# Patient Record
Sex: Female | Born: 1970 | Race: White | Hispanic: No | Marital: Married
Health system: Midwestern US, Community
[De-identification: ages and names within clinical notes are randomized; demographics above are authoritative.]

## PROBLEM LIST (undated history)

## (undated) DIAGNOSIS — Z923 Personal history of irradiation: Secondary | ICD-10-CM

## (undated) DIAGNOSIS — Z9221 Personal history of antineoplastic chemotherapy: Secondary | ICD-10-CM

## (undated) DIAGNOSIS — C50919 Malignant neoplasm of unspecified site of unspecified female breast: Secondary | ICD-10-CM

## (undated) DIAGNOSIS — E66811 Obesity, class 1: Principal | ICD-10-CM

## (undated) DIAGNOSIS — E7801 Familial hypercholesterolemia: Secondary | ICD-10-CM

## (undated) DIAGNOSIS — Z1231 Encounter for screening mammogram for malignant neoplasm of breast: Principal | ICD-10-CM

## (undated) DIAGNOSIS — Z6831 Body mass index (BMI) 31.0-31.9, adult: Secondary | ICD-10-CM

## (undated) DIAGNOSIS — E039 Hypothyroidism, unspecified: Secondary | ICD-10-CM

## (undated) DIAGNOSIS — Z6832 Body mass index (BMI) 32.0-32.9, adult: Secondary | ICD-10-CM

## (undated) DIAGNOSIS — E6609 Other obesity due to excess calories: Secondary | ICD-10-CM

## (undated) DIAGNOSIS — R1010 Upper abdominal pain, unspecified: Secondary | ICD-10-CM

## (undated) DIAGNOSIS — N3000 Acute cystitis without hematuria: Secondary | ICD-10-CM

## (undated) HISTORY — DX: Personal history of antineoplastic chemotherapy: Z92.21

## (undated) HISTORY — DX: Malignant neoplasm of unspecified site of unspecified female breast: C50.919

## (undated) HISTORY — DX: Personal history of irradiation: Z92.3

## (undated) HISTORY — PX: BREAST BIOPSY: SHX20

## (undated) HISTORY — PX: WISDOM TOOTH EXTRACTION: SHX21

---

## 2010-04-14 ENCOUNTER — Other Ambulatory Visit: Payer: Self-pay | Admitting: Family Medicine

## 2010-04-14 DIAGNOSIS — Z1231 Encounter for screening mammogram for malignant neoplasm of breast: Secondary | ICD-10-CM

## 2010-04-25 ENCOUNTER — Ambulatory Visit
Admission: RE | Admit: 2010-04-25 | Discharge: 2010-04-25 | Disposition: A | Discharge status: Home / Self Care | Payer: BLUE CROSS/BLUE SHIELD | Source: Ambulatory Visit | Attending: Family Medicine | Admitting: Family Medicine

## 2010-04-25 DIAGNOSIS — Z1231 Encounter for screening mammogram for malignant neoplasm of breast: Secondary | ICD-10-CM

## 2011-04-24 ENCOUNTER — Other Ambulatory Visit: Payer: Self-pay | Admitting: Family Medicine

## 2011-04-24 DIAGNOSIS — Z1231 Encounter for screening mammogram for malignant neoplasm of breast: Secondary | ICD-10-CM

## 2011-05-01 ENCOUNTER — Ambulatory Visit
Admission: RE | Admit: 2011-05-01 | Discharge: 2011-05-01 | Disposition: A | Discharge status: Home / Self Care | Payer: BLUE CROSS/BLUE SHIELD | Source: Ambulatory Visit | Attending: Family Medicine | Admitting: Family Medicine

## 2011-05-01 DIAGNOSIS — Z1231 Encounter for screening mammogram for malignant neoplasm of breast: Secondary | ICD-10-CM

## 2011-05-02 ENCOUNTER — Ambulatory Visit: Payer: BLUE CROSS/BLUE SHIELD

## 2011-05-03 ENCOUNTER — Other Ambulatory Visit: Payer: Self-pay | Admitting: Family Medicine

## 2011-05-03 DIAGNOSIS — R928 Other abnormal and inconclusive findings on diagnostic imaging of breast: Secondary | ICD-10-CM

## 2011-05-08 ENCOUNTER — Other Ambulatory Visit: Payer: Self-pay | Admitting: Family Medicine

## 2011-05-08 ENCOUNTER — Ambulatory Visit
Admission: RE | Admit: 2011-05-08 | Discharge: 2011-05-08 | Disposition: A | Discharge status: Home / Self Care | Payer: BC Managed Care – PPO | Source: Ambulatory Visit | Attending: Family Medicine | Admitting: Family Medicine

## 2011-05-08 DIAGNOSIS — N63 Unspecified lump in unspecified breast: Secondary | ICD-10-CM

## 2011-05-08 DIAGNOSIS — R928 Other abnormal and inconclusive findings on diagnostic imaging of breast: Secondary | ICD-10-CM

## 2011-05-18 ENCOUNTER — Ambulatory Visit
Admission: RE | Admit: 2011-05-18 | Discharge: 2011-05-18 | Disposition: A | Discharge status: Home / Self Care | Payer: BC Managed Care – PPO | Source: Ambulatory Visit | Attending: Family Medicine | Admitting: Family Medicine

## 2011-05-18 DIAGNOSIS — N63 Unspecified lump in unspecified breast: Secondary | ICD-10-CM

## 2011-05-22 ENCOUNTER — Other Ambulatory Visit: Payer: Self-pay | Admitting: Family Medicine

## 2011-05-22 DIAGNOSIS — C50911 Malignant neoplasm of unspecified site of right female breast: Secondary | ICD-10-CM

## 2011-05-25 ENCOUNTER — Other Ambulatory Visit: Payer: Self-pay | Admitting: *Deleted

## 2011-05-25 ENCOUNTER — Telehealth: Payer: Self-pay | Admitting: *Deleted

## 2011-05-25 DIAGNOSIS — C50419 Malignant neoplasm of upper-outer quadrant of unspecified female breast: Secondary | ICD-10-CM

## 2011-05-25 NOTE — Telephone Encounter (Signed)
Confirmed [facility] for 05/31/11 at 0800 .  Instructions and contact information given.  

## 2011-05-26 ENCOUNTER — Inpatient Hospital Stay: Admission: RE | Admit: 2011-05-26 | Payer: BC Managed Care – PPO | Source: Ambulatory Visit

## 2011-05-27 ENCOUNTER — Other Ambulatory Visit: Payer: BC Managed Care – PPO

## 2011-05-30 ENCOUNTER — Ambulatory Visit
Admission: RE | Admit: 2011-05-30 | Discharge: 2011-05-30 | Disposition: A | Discharge status: Home / Self Care | Payer: BC Managed Care – PPO | Source: Ambulatory Visit | Attending: Family Medicine | Admitting: Family Medicine

## 2011-05-30 DIAGNOSIS — C50911 Malignant neoplasm of unspecified site of right female breast: Secondary | ICD-10-CM

## 2011-05-30 MED ORDER — GADOBENATE DIMEGLUMINE 529 MG/ML IV SOLN
12.0000 mL | Freq: Once | INTRAVENOUS | Status: AC | PRN
  Administered 2011-05-30: 12 mL via INTRAVENOUS

## 2011-05-31 ENCOUNTER — Telehealth: Payer: Self-pay | Admitting: Oncology

## 2011-05-31 ENCOUNTER — Ambulatory Visit: Payer: BC Managed Care – PPO

## 2011-05-31 ENCOUNTER — Ambulatory Visit
Admission: RE | Admit: 2011-05-31 | Discharge: 2011-05-31 | Disposition: A | Discharge status: Home / Self Care | Payer: BC Managed Care – PPO | Source: Ambulatory Visit | Attending: Radiation Oncology | Admitting: Radiation Oncology

## 2011-05-31 ENCOUNTER — Encounter: Payer: Self-pay | Admitting: Specialist

## 2011-05-31 ENCOUNTER — Ambulatory Visit: Payer: BC Managed Care – PPO | Attending: Surgery | Admitting: Physical Therapy

## 2011-05-31 ENCOUNTER — Other Ambulatory Visit (HOSPITAL_BASED_OUTPATIENT_CLINIC_OR_DEPARTMENT_OTHER): Payer: BC Managed Care – PPO | Admitting: Lab

## 2011-05-31 ENCOUNTER — Other Ambulatory Visit: Payer: Self-pay | Admitting: Family Medicine

## 2011-05-31 ENCOUNTER — Encounter: Payer: Self-pay | Admitting: Oncology

## 2011-05-31 ENCOUNTER — Encounter: Payer: Self-pay | Admitting: *Deleted

## 2011-05-31 ENCOUNTER — Ambulatory Visit (HOSPITAL_BASED_OUTPATIENT_CLINIC_OR_DEPARTMENT_OTHER): Payer: BC Managed Care – PPO | Admitting: Surgery

## 2011-05-31 ENCOUNTER — Ambulatory Visit (HOSPITAL_BASED_OUTPATIENT_CLINIC_OR_DEPARTMENT_OTHER): Payer: BC Managed Care – PPO | Admitting: Oncology

## 2011-05-31 ENCOUNTER — Encounter (INDEPENDENT_AMBULATORY_CARE_PROVIDER_SITE_OTHER): Payer: Self-pay | Admitting: Surgery

## 2011-05-31 VITALS — BP 120/72 | HR 80 | Temp 98.5°F | Resp 20 | Ht 68.6 in | Wt 129.0 lb

## 2011-05-31 VITALS — BP 120/72 | HR 84 | Temp 98.5°F | Ht 68.6 in | Wt 129.2 lb

## 2011-05-31 DIAGNOSIS — C50919 Malignant neoplasm of unspecified site of unspecified female breast: Secondary | ICD-10-CM | POA: Insufficient documentation

## 2011-05-31 DIAGNOSIS — C773 Secondary and unspecified malignant neoplasm of axilla and upper limb lymph nodes: Secondary | ICD-10-CM

## 2011-05-31 DIAGNOSIS — R928 Other abnormal and inconclusive findings on diagnostic imaging of breast: Secondary | ICD-10-CM

## 2011-05-31 DIAGNOSIS — C50419 Malignant neoplasm of upper-outer quadrant of unspecified female breast: Secondary | ICD-10-CM

## 2011-05-31 DIAGNOSIS — M25619 Stiffness of unspecified shoulder, not elsewhere classified: Secondary | ICD-10-CM | POA: Insufficient documentation

## 2011-05-31 DIAGNOSIS — IMO0001 Reserved for inherently not codable concepts without codable children: Secondary | ICD-10-CM | POA: Insufficient documentation

## 2011-05-31 DIAGNOSIS — D0590 Unspecified type of carcinoma in situ of unspecified breast: Secondary | ICD-10-CM

## 2011-05-31 LAB — COMPREHENSIVE METABOLIC PANEL
ALT: 17 U/L (ref 0–35)
AST: 24 U/L (ref 0–37)
CO2: 27 mEq/L (ref 19–32)
Chloride: 103 mEq/L (ref 96–112)
Sodium: 138 mEq/L (ref 135–145)
Total Bilirubin: 0.4 mg/dL (ref 0.3–1.2)
Total Protein: 7.5 g/dL (ref 6.0–8.3)

## 2011-05-31 LAB — CBC WITH DIFFERENTIAL/PLATELET
BASO%: 0.4 % (ref 0.0–2.0)
Eosinophils Absolute: 0 10*3/uL (ref 0.0–0.5)
MCHC: 34.1 g/dL (ref 31.5–36.0)
MONO#: 0.4 10*3/uL (ref 0.1–0.9)
MONO%: 9.6 % (ref 0.0–14.0)
NEUT#: 2.4 10*3/uL (ref 1.5–6.5)
RBC: 4.25 10*6/uL (ref 3.70–5.45)
RDW: 12.3 % (ref 11.2–14.5)
WBC: 4.1 10*3/uL (ref 3.9–10.3)
nRBC: 0 % (ref 0–0)

## 2011-05-31 NOTE — Progress Notes (Signed)
Patient came in this morning as a new patient and she has one Editor, commissioning.I did give her a Medicaid application to fill out and return to me,I also explain to her our co-pay assistance program we offer here at the cancer center.

## 2011-05-31 NOTE — Telephone Encounter (Signed)
gve the pt her pet/ct scan appt along with the may/ June 2013 appt calendar.  Pt is aware she will be contacted with the appt to see dr Jamey Ripa for the port placement.

## 2011-05-31 NOTE — Progress Notes (Signed)
Radiation Oncology         [phone] ________________________________  Initial outpatient Consultation  Name: Carolyn [name] MRN: [id number]  Date: 05/31/2011  DOB: 23-Apr-1970  HQ:IONGE,[email or web address], MD, MD  Streck, Reola Mosher, MD   REFERRING PHYSICIAN: [name], MD  DIAGNOSIS: Right breast cancer  HISTORY OF PRESENT ILLNESS::Carolyn [name] is a 41 y.o. female who is  referred today for newly diagnosed right breast cancer. She underwent a mammogram which showed calcifications measuring about 5 cm on the right breast. At ultrasound of this measured about 2.9 cm and is suspicious right axillary lymph node was noted. 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 No lymphatic tissue was seen and the node is thought to be completely replaced by tumor. MRI of the bilateral breasts was performed which showed a 5 x 3 x 4.5 cm mass in the right breast abutting the chest wall. Approximate 7 cm away from this was a no other mass in the upper inner quadrant of the right breast measuring 1.5 x 1.7 x 1.5 cm. In the left breast a 2 cm area of suspicious enhancement was noted. This has not been biopsied. There were no suspicious lymph nodes. Nonstick panel is pending she did palpate this mass herself. She said it was not painful and had no other symptoms such as nipple discharge or skin changes.  PREVIOUS RADIATION THERAPY: No  PAST MEDICAL HISTORY:  has a past medical history of Breast cancer.    PAST SURGICAL HISTORY:No past surgical history on file.  FAMILY HISTORY: family history includes Breast cancer in her maternal aunt.  SOCIAL HISTORY:  reports that she has never smoked. She does not have any smokeless tobacco history on file. She reports that she does not drink alcohol or use illicit drugs.  ALLERGIES: Allegra  MEDICATIONS:  Current Outpatient Prescriptions  Medication Sig Dispense Refill  .  cholecalciferol (VITAMIN D) 400 UNITS TABS Take 2,000 Units by mouth.      . Loratadine 10 MG CAPS Take 10 mg by mouth daily.      . Multiple Vitamin (MULTIVITAMIN) tablet Take 1 tablet by mouth daily.       No current facility-administered medications for this encounter.   Facility-Administered Medications Ordered in Other Encounters  Medication Dose Route Frequency Provider Last Rate Last Dose  . gadobenate dimeglumine (MULTIHANCE) injection 12 mL  12 mL Intravenous Once PRN Medication Radiologist, MD   12 mL at 05/30/11 2003    REVIEW OF SYSTEMS:  A 15 point review of systems is documented in the electronic medical record. This was obtained by the nursing staff. However, I reviewed this with the patient to discuss relevant findings and make appropriate changes.  Pertinent items are noted in HPI.   PHYSICAL EXAM: VS: BP 120/72  Pulse 80  Temp 98.5 F (36.9 C)  Resp 20  Wt 129 lb (58.514 kg)  She is a pleasant female in no distress sitting comfortably examining table. She has been. There is a hard mass in the lateral aspect of the right breast. There is another mass which appears almost fixed to the chest wall in the upper inner quadrant which appear smaller and is about 2 fingerbreadths in width. No palpable abnormalities of the left breast. I am able to palpate either some biopsy change or positive lymph node in the right axilla. Nothing palpable in the left axilla. She has no palpable supraclavicular cervical adenopathy. She  is 5 out of 5 strength in her bilateral upper and lower extremities.  LABORATORY DATA:  Lab Results  Component Value Date   WBC 4.1 05/31/2011   HGB 13.0 05/31/2011   HCT 38.0 05/31/2011   MCV 89.4 05/31/2011   PLT 166 05/31/2011   Lab Results  Component Value Date   NA 138 05/31/2011   K 3.7 05/31/2011   CL 103 05/31/2011   CO2 27 05/31/2011   Lab Results  Component Value Date   ALT 17 05/31/2011   AST 24 05/31/2011   ALKPHOS 78 05/31/2011   BILITOT 0.4  05/31/2011     RADIOGRAPHY: US Breast Right  05/08/2011  *RADIOLOGY REPORT*  Clinical Data:  The patient for evaluation of possible distortion and calcifications in the outer portion of the right breast noted on screening study dated 05/01/2011.  DIGITAL DIAGNOSTIC RIGHT LIMITED MAMMOGRAM  AND RIGHT BREAST ULTRASOUND:  Comparison:  04/25/2010 from the [facility]. 07/03/2008 and 07/01/2007 from [facility] in [place], [place].  Findings:  Additional views demonstrate suspicious pleomorphic microcalcifications in the 9 o'clock position of the right breast posteriorly; these calcifications cover an area of approximately 2.6 x 1.1 x 1.1 cm.  There is a suggestion of an underlying mass and distortion associated with these calcifications.  On physical exam, I palpate a firm mass at 9 o'clock 8 cm from the right nipple.  Ultrasound is performed, showing an irregular mass with calcifications at 9 o'clock 8 cm from the right nipple measuring 2.9 x 1.2 x 2.4 cm. There is an irregular mass contiguous with the first mass at 9 o'clock 4 cm from the right nipple measuring approximately 1.8 x 2.1 x 1.1 cm. Sonography of the right axilla demonstrates an abnormal lymph node measuring 0.9 x 0.6 x 0.9 cm. The appearance is suspicious for invasive mammary carcinoma with possible axillary nodal metastasis.  Ultrasound-guided core needle biopsy of the masses at 9 o'clock and the right axillary lymph node is suggested.  This has been scheduled for 05/18/2011.  IMPRESSION: Suspicious masses and calcifications at 9 o'clock four and 8 cm from the right nipple.  Suspicious right axillary lymph node. Biopsy is suggested.  Ultrasound-guided core needle biopsy has been scheduled for 05/18/2011.  Dr. Chauncy Passy office was closed when this exam was completed.  BI-RADS CATEGORY 4:  Suspicious abnormality - biopsy should be considered.  Original Report Authenticated By: Daryl Eastern, M.D.   Mr Breast Bilateral W Wo Contrast  05/31/2011  *RADIOLOGY REPORT*  Clinical Data: 41 year old female with newly diagnosed right breast neoplasm.  BILATERAL BREAST MRI WITH AND WITHOUT CONTRAST  Technique: Multiplanar, multisequence MR images of both breasts were obtained prior to and following the intravenous administration of 12ml of Multihance.  Three dimensional images were evaluated at the independent DynaCad workstation.  Comparison:  Recent and prior mammograms and 05/08/2011 ultrasound.  Findings: Moderate normal background parenchymal enhancement is identified.  A 5 x 3 x 4.5 cm confluent enhancing mass (rapid washout kinetics) within the outer right breast, middle and posterior thirds, contains biopsy clip artifact and compatible with biopsy-proven neoplasm. This neoplasm abuts the chest wall musculature.  A 1.5 x 1.7 x 1.5 cm irregular enhancing mass (plateau kinetics) is identified in the upper inner right breast, posterior third, and suspicious for neoplasm.  This mass lies approximately 7 cm superior and medial to the biopsy-proven neoplasm in the outer right breast.  A 2 cm linear area of stippled  enhancement (plateau kinetics) is identified within the deep central left breast - suspicious.  No enlarged or abnormal appearing lymph nodes are noted. The biopsy- proven positive right axillary lymph node is difficult to visualize on this study. No bony or upper hepatic abnormalities are identified.  IMPRESSION: 5 x 3 x 4.5 cm biopsy-proven neoplasm in the posterior outer right breast abutting the chest wall musculature.  1.5 x 1.7 x 1.5 cm irregular enhancing mass in the posterior upper inner right breast - highly suspicious for neoplasm.  Second look ultrasound and biopsy is recommended.  2 cm suspicion linear stippled enhancement within the posterior central left breast - this is unlikely to be visualized sonographically and MR guided biopsy is recommended.  No enlarged or  abnormal-appearing lymph nodes identified. The biopsy-proven positive right axillary lymph node is difficult to visualize on this study.  THREE-DIMENSIONAL MR IMAGE RENDERING ON INDEPENDENT WORKSTATION:  Three-dimensional MR images were rendered by post-processing of the original MR data on an independent workstation.  The three- dimensional MR images were interpreted, and findings were reported in the accompanying complete MRI report for this study.  BI-RADS CATEGORY 5:  Highly suggestive of malignancy - appropriate action should be taken.  Recommend second look right breast ultrasound and biopsy of the upper inner right breast mass.  Recommend MR guided biopsy of the linear stippled enhancement in the posterior central left breast.  Original Report Authenticated By: Rosendo Gros, M.D.   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  Biopsy right axillary lymph node demonstrates ductal carcinoma, consistent with metastatic involvement.  Pathology results of both biopsies are concordant.  The patient was called with the biopsy results and reports. She reports no problems following the biopsy.  She is scheduled for multidisciplinary breast Clinic 05/31/2011, and breast  MRI on 05/26/2011.  **END ADDENDUM** SIGNED BY: Britta Mccreedy, M.D.    05/18/2011  *RADIOLOGY REPORT*  Clinical Data:  Right breast mass, abnormal right axillary node.  ULTRASOUND GUIDED CORE BIOPSY OF THE RIGHT BREAST AND RIGHT AXILLA  Comparison: 05/08/2011  I met with the patient and we discussed the procedure of ultrasound- guided biopsy, including benefits and alternatives.  We discussed the high likelihood of a successful procedure. We discussed the risks of the procedure, including infection, bleeding, tissue injury, clip migration, and inadequate sampling.  Informed written consent was given.   Using sterile technique 2% lidocaine, ultrasound guidance and a 14 gauge automated biopsy device, biopsy was done of the right breast mass (site 1 of 2).  At the conclusion of the procedure a ribbon type tissue marker clip was deployed into the biopsy cavity.  Then, attention is turned to the right axilla.  Using sonogrpahic guidance and a 14 gauge automated biopsy device, biopsy was done of the right axillary mass (site 2 of 2).  No clip is placed.  Follow up 2 view mammogram was performed and dictated separately.  IMPRESSION: Ultrasound guided biopsy of a right breast mass (site 1 of 2) and abnormal right axillary node (site 2 of 2).  No apparent complications. Original Report Authenticated By: Britta Mccreedy, M.D.   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  Biopsy right axillary lymph node demonstrates ductal carcinoma, consistent with metastatic  involvement.  Pathology results of both biopsies are concordant.  The patient was called with the biopsy results and reports. She reports no problems following the biopsy.  She is scheduled for multidisciplinary breast Clinic 05/31/2011, and breast  MRI on 05/26/2011.  **END ADDENDUM** SIGNED BY: Britta Mccreedy, M.D.    05/18/2011  *RADIOLOGY REPORT*  Clinical Data:  Right breast mass, abnormal right axillary node.  ULTRASOUND GUIDED CORE BIOPSY OF THE RIGHT BREAST AND RIGHT AXILLA  Comparison: 05/08/2011  I met with the patient and we discussed the procedure of ultrasound- guided biopsy, including benefits and alternatives.  We discussed the high likelihood of a successful procedure. We discussed the risks of the procedure, including infection, bleeding, tissue injury, clip migration, and inadequate sampling.  Informed written consent was given.  Using sterile technique 2% lidocaine, ultrasound guidance and a 14 gauge  automated biopsy device, biopsy was done of the right breast mass (site 1 of 2).  At the conclusion of the procedure a ribbon type tissue marker clip was deployed into the biopsy cavity.  Then, attention is turned to the right axilla.  Using sonogrpahic guidance and a 14 gauge automated biopsy device, biopsy was done of the right axillary mass (site 2 of 2).  No clip is placed.  Follow up 2 view mammogram was performed and dictated separately.  IMPRESSION: Ultrasound guided biopsy of a right breast mass (site 1 of 2) and abnormal right axillary node (site 2 of 2).  No apparent complications. Original Report Authenticated By: Britta Mccreedy, M.D.   Mm Digital Diag Ltd R  05/08/2011  *RADIOLOGY REPORT*  Clinical Data:  The patient for evaluation of possible distortion and calcifications in the outer portion of the right breast noted on screening study dated 05/01/2011.  DIGITAL DIAGNOSTIC RIGHT LIMITED MAMMOGRAM  AND RIGHT BREAST ULTRASOUND:  Comparison:  04/25/2010 from the [facility]. 07/03/2008 and 07/01/2007 from [facility] in [place], [place].  Findings:  Additional views demonstrate suspicious pleomorphic microcalcifications in the 9 o'clock position of the right breast posteriorly; these calcifications cover an area of approximately 2.6 x 1.1 x 1.1 cm.  There is a suggestion of an underlying mass and distortion associated with these calcifications.  On physical exam, I palpate a firm mass at 9 o'clock 8 cm from the right nipple.  Ultrasound is performed, showing an irregular mass with calcifications at 9 o'clock 8 cm from the right nipple measuring 2.9 x 1.2 x 2.4 cm. There is an irregular mass contiguous with the first mass at 9 o'clock 4 cm from the right nipple measuring approximately 1.8 x 2.1 x 1.1 cm. Sonography of the right axilla demonstrates an abnormal lymph node measuring 0.9 x 0.6 x 0.9 cm. The appearance is suspicious for invasive  mammary carcinoma with possible axillary nodal metastasis.  Ultrasound-guided core needle biopsy of the masses at 9 o'clock and the right axillary lymph node is suggested.  This has been scheduled for 05/18/2011.  IMPRESSION: Suspicious masses and calcifications at 9 o'clock four and 8 cm from the right nipple.  Suspicious right axillary lymph node. Biopsy is suggested.  Ultrasound-guided core needle biopsy has been scheduled for 05/18/2011.  Dr. Chauncy Passy office was closed when this exam was completed.  BI-RADS CATEGORY 4:  Suspicious abnormality - biopsy should be considered.  Original Report Authenticated By: Daryl Eastern, M.D.   Mm Digital Diagnostic Unilat R  05/18/2011  *RADIOLOGY REPORT*  Clinical Data:  Right breast  biopsy.  DIGITAL DIAGNOSTIC RIGHT MAMMOGRAM  Comparison:  Multiple priors  Findings:  Films are performed following ultrasound guided biopsy of a right breast mass.  Images show a ribbon type clip in the 9 o'clock position of the right breast, posteriorly.  Previously biopsied fibrocystic changes noted in the 8 o'clock position of the right breast, posteriorly denoted with a hook type clip.  IMPRESSION: Adequate clip placement following ultrasound guided right breast biopsy.  Original Report Authenticated By: Hiram Gash, M.D.   Mm Digital Screening  05/03/2011  DG SCREEN MAMMOGRAM BILATERAL Bilateral CC and MLO view(s) were taken.  DIGITAL SCREENING MAMMOGRAM WITH CAD:  Comparison:  Prior studies.  The breast tissue is extremely dense.  There are calcifications in the right breast.   Characterization with magnification views is recommended.  The left breast is unremarkable.  Images were processed with CAD.  IMPRESSION:  Calcifications, right breast.  Additional evaluation is indicated.  The patient will be contacted  for additional studies and a supplemental report will follow.  ASSESSMENT: Need additional imaging evaluation and/or prior mammograms for comparison - BI-RADS 0 -  Right  Further imaging of the right breast. 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 Sandford in her mother. We discussed that likely she has invasive disease in the breast and this lymph node corresponds to that. We discussed that the DCIS is likely a sampling error. We discussed that because of the distance between HER-2 masses we would recommend a mastectomy. Because these masses do appear to abut the adjacent pectoralis muscle we have recommended neoadjuvant chemotherapy. At this point she likely has a T2 N1 lesion and will would therefore benefit from postmastectomy radiation. She has discussed neoadjuvant chemotherapy with Dr. Welton Flakes and her mastectomy as well as axillary lymph node dissection with Dr. Jamey Ripa. She will also need to be evaluated by genetics and a Port-A-Cath placed. We're still awaiting her prognostic profile as well as the workup of what is going on in her left breast. We did discuss plastic surgery referral and she does not feel she is ready to undergo evaluation at this time. We briefly discussed the role of radiation intake in decreasing local failures. We discussed the process of simulation the placement of tattoos. I will plan on meeting back with her for followup after her chemotherapy and surgery are complete.   I spent 60 minutes minutes face to face with the patient and more than 50% of that time was spent in counseling and/or coordination of care.   ------------------------------------------------  Lurline Hare, MD

## 2011-05-31 NOTE — Patient Instructions (Signed)
My office will call to schedule you for a Port-A-Cath placement to be done as an outpatient sometime in the near future. We will try to work to scheduling of this around your other tests. Call my office if you have any questions about this. The office phone number is [phone]

## 2011-05-31 NOTE — Progress Notes (Signed)
Carolyn [name] [id number] 05-04-1970 41 y.o. 05/31/2011 10:07 AM  CC Dr. Maryelizabeth Rowan Dr. Cicero Duck Dr. Lurline Hare  REASON FOR CONSULTATION:  41 year old female with new diagnosis of right breast cancer found on screening mammogram. Patient is seen in the multidisciplinary breast clinic for discussion of treatment options.  STAGE:   Cancer of upper-outer quadrant of female breast   Primary site: Breast (Left)   Staging method: AJCC 7th Edition   Clinical: Stage IIB (T2, N1, cM0)   Summary: Stage IIB (T2, N1, cM0)  REFERRING PHYSICIAN: Dr. Cyndia Bent  HISTORY OF PRESENT ILLNESS:  Carolyn [name] is a 41 y.o. female.  Without significant past medical history. She underwent a mammogram that showed calcifications measuring 5 cm on the right breast. She then went on To have an ultrasound which showed the area to measure 2.9 cm. She was also found to have a right axillary lymph node that was suspicious for a malignancy. She had a biopsy of the calcifications that showed high-grade ductal carcinoma in situ. 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 In the lymph node biopsy there was no lymphatic tissue seen and was felt that the node was replaced by tumor. Patient went on to have an MRI of the bilateral breasts performed on evening of 05/30/2011. The MRI showed in the right breast 5 x 3 x 4.5 cm mass abutting the chest wall. About 7 cm away from this there was another mass in the upper inner quadrant that measured 1.5 x 1.7 x 1.5 cm. On the contralateral breast, that is the left breast a 2 cm area suspicious enhancement was noted also this up to date has not been biopsied and arrangements are being made for the biopsy to be performed. In this side there were no suspicious lymph nodes. The prognostic panel is pending. Patient is otherwise without any complaints. She is seen in the multidisciplinary breast clinic today with Dr. Cyndia Bent  and Dr. Lurline Hare.   Past Medical History: Past Medical History  Diagnosis Date  . Breast cancer     Past Surgical History: No past surgical history on file.  Family History: Family History  Problem Relation Age of Onset  . Breast cancer Maternal Aunt     Social History The patient is single she has no children she works as a Nurse, children's. History  Substance Use Topics  . Smoking status: Never Smoker   . Smokeless tobacco: Not on file  . Alcohol Use: No    Allergies: Allergies  Allergen Reactions  . Allegra (Fexofenadine) Hives    Current Medications: Current Outpatient Prescriptions  Medication Sig Dispense Refill  . cholecalciferol (VITAMIN D) 400 UNITS TABS Take 2,000 Units by mouth.      . Loratadine 10 MG CAPS Take 10 mg by mouth daily.      . Multiple Vitamin (MULTIVITAMIN) tablet Take 1 tablet by mouth daily.       No current facility-administered medications for this visit.   Facility-Administered Medications Ordered in Other Visits  Medication Dose Route Frequency Provider Last Rate Last Dose  . gadobenate dimeglumine (MULTIHANCE) injection 12 mL  12 mL Intravenous Once PRN Medication Radiologist, MD   12 mL at 05/30/11 2003    OB/GYN History: Menarche at age 27 she is premenopausal her last menstrual cycle was on 05/12/2011 her periods are very regular. She has not had any pregnancies.  Fertility Discussion: Patient and I discussed fertility discussion and her  desire to have children. At this time she tells me that she is not planning on the having any children. Thus she has no fertility needs identified  Prior History of Cancer: No prior history of cancers.  Health Maintenance:  Colonoscopy Patient has not had a colonoscopy Bone Density No previous bone densities. Last PAP smear Her Pap smears are up-to-date.  ECOG PERFORMANCE STATUS: 0 - Asymptomatic  Genetic Counseling/testing: To complete family history was taken today.  One member of her family on the maternal side was identified as having breast cancer. This was a maternal aunt who had breast cancer at the age of 70. Other history of malignancies such as ovarian colon or endometrial carcinomas. However patient will be referred her to genetic counseling and testing per cc and guidelines given her Early age Of onset or  REVIEW OF SYSTEMS:  A comprehensive review of systems was negative.  PHYSICAL EXAMINATION: Blood pressure 120/72, pulse 84, temperature 98.5 F (36.9 C), height 5' 8.6" (1.742 m), weight 129 lb 3.2 oz (58.605 kg).  WUJ:WJXBJ, healthy, no distress, well nourished and well developed SKIN: skin color, texture, turgor are normal HEAD: No masses, lesions, tenderness or abnormalities EYES: PERRLA, EOMI, sclera clear EARS: External ears normal OROPHARYNX:no exudate, no erythema and lips, buccal mucosa, and tongue normal  NECK: supple, no adenopathy, thyroid normal size, non-tender, without nodularity LYMPH:  Right axillary lymph node is palpable but there are no other palpable lymph nodes in the supraclavicular cervical or left axillary areas no inguinal adenopathy. BREAST:Bilateral breast examination is performed right breast does now reveal a palpable mass with thickening. Left breast no masses or nipple discharge. LUNGS: clear to auscultation and percussion HEART: regular rate & rhythm, no murmurs and no gallops ABDOMEN:abdomen soft, non-tender, normal bowel sounds and no masses or organomegaly BACK: Back symmetric, no curvature., No CVA tenderness EXTREMITIES:less then 2 second capillary refill  NEURO: alert & oriented x 3 with fluent speech, no focal motor/sensory deficits, gait normal, reflexes normal and symmetric   STUDIES/RESULTS: US Breast Right  05/08/2011  *RADIOLOGY REPORT*  Clinical Data:  The patient for evaluation of possible distortion and calcifications in the outer portion of the right breast noted on screening study dated  05/01/2011.  DIGITAL DIAGNOSTIC RIGHT LIMITED MAMMOGRAM  AND RIGHT BREAST ULTRASOUND:  Comparison:  04/25/2010 from the [facility]. 07/03/2008 and 07/01/2007 from [facility] and Breast Imaging Center in [place], [place].  Findings:  Additional views demonstrate suspicious pleomorphic microcalcifications in the 9 o'clock position of the right breast posteriorly; these calcifications cover an area of approximately 2.6 x 1.1 x 1.1 cm.  There is a suggestion of an underlying mass and distortion associated with these calcifications.  On physical exam, I palpate a firm mass at 9 o'clock 8 cm from the right nipple.  Ultrasound is performed, showing an irregular mass with calcifications at 9 o'clock 8 cm from the right nipple measuring 2.9 x 1.2 x 2.4 cm. There is an irregular mass contiguous with the first mass at 9 o'clock 4 cm from the right nipple measuring approximately 1.8 x 2.1 x 1.1 cm. Sonography of the right axilla demonstrates an abnormal lymph node measuring 0.9 x 0.6 x 0.9 cm. The appearance is suspicious for invasive mammary carcinoma with possible axillary nodal metastasis.  Ultrasound-guided core needle biopsy of the masses at 9 o'clock and the right axillary lymph node is suggested.  This has been scheduled for 05/18/2011.  IMPRESSION: Suspicious masses and calcifications at 9  o'clock four and 8 cm from the right nipple.  Suspicious right axillary lymph node. Biopsy is suggested.  Ultrasound-guided core needle biopsy has been scheduled for 05/18/2011.  Dr. Chauncy Passy office was closed when this exam was completed.  BI-RADS CATEGORY 4:  Suspicious abnormality - biopsy should be considered.  Original Report Authenticated By: Daryl Eastern, M.D.   Mr Breast Bilateral W Wo Contrast  05/31/2011  *RADIOLOGY REPORT*  Clinical Data: 41 year old female with newly diagnosed right breast neoplasm.  BILATERAL BREAST MRI WITH AND WITHOUT CONTRAST  Technique: Multiplanar,  multisequence MR images of both breasts were obtained prior to and following the intravenous administration of 12ml of Multihance.  Three dimensional images were evaluated at the independent DynaCad workstation.  Comparison:  Recent and prior mammograms and 05/08/2011 ultrasound.  Findings: Moderate normal background parenchymal enhancement is identified.  A 5 x 3 x 4.5 cm confluent enhancing mass (rapid washout kinetics) within the outer right breast, middle and posterior thirds, contains biopsy clip artifact and compatible with biopsy-proven neoplasm. This neoplasm abuts the chest wall musculature.  A 1.5 x 1.7 x 1.5 cm irregular enhancing mass (plateau kinetics) is identified in the upper inner right breast, posterior third, and suspicious for neoplasm.  This mass lies approximately 7 cm superior and medial to the biopsy-proven neoplasm in the outer right breast.  A 2 cm linear area of stippled enhancement (plateau kinetics) is identified within the deep central left breast - suspicious.  No enlarged or abnormal appearing lymph nodes are noted. The biopsy- proven positive right axillary lymph node is difficult to visualize on this study. No bony or upper hepatic abnormalities are identified.  IMPRESSION: 5 x 3 x 4.5 cm biopsy-proven neoplasm in the posterior outer right breast abutting the chest wall musculature.  1.5 x 1.7 x 1.5 cm irregular enhancing mass in the posterior upper inner right breast - highly suspicious for neoplasm.  Second look ultrasound and biopsy is recommended.  2 cm suspicion linear stippled enhancement within the posterior central left breast - this is unlikely to be visualized sonographically and MR guided biopsy is recommended.  No enlarged or abnormal-appearing lymph nodes identified. The biopsy-proven positive right axillary lymph node is difficult to visualize on this study.  THREE-DIMENSIONAL MR IMAGE RENDERING ON INDEPENDENT WORKSTATION:  Three-dimensional MR images were rendered by  post-processing of the original MR data on an independent workstation.  The three- dimensional MR images were interpreted, and findings were reported in the accompanying complete MRI report for this study.  BI-RADS CATEGORY 5:  Highly suggestive of malignancy - appropriate action should be taken.  Recommend second look right breast ultrasound and biopsy of the upper inner right breast mass.  Recommend MR guided biopsy of the linear stippled enhancement in the posterior central left breast.  Original Report Authenticated By: Rosendo Gros, M.D.   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  Biopsy right axillary lymph node demonstrates ductal carcinoma, consistent with metastatic involvement.  Pathology results of both biopsies are concordant.  The patient was called with the biopsy results and reports. She reports no problems following the biopsy.  She is scheduled for multidisciplinary breast Clinic 05/31/2011, and breast  MRI on 05/26/2011.  **END ADDENDUM** SIGNED BY: Britta Mccreedy, M.D.    05/18/2011  *RADIOLOGY REPORT*  Clinical Data:  Right breast mass, abnormal right axillary node.  ULTRASOUND GUIDED CORE BIOPSY OF THE RIGHT BREAST AND RIGHT AXILLA  Comparison: 05/08/2011  I met with the patient and we discussed the procedure of ultrasound- guided biopsy, including benefits and alternatives.  We discussed the high likelihood of a successful procedure. We discussed the risks of the procedure, including infection, bleeding, tissue injury, clip migration, and inadequate sampling.  Informed written consent was given.  Using sterile technique 2% lidocaine, ultrasound guidance and a 14 gauge automated biopsy device, biopsy was done of the right breast mass (site 1 of 2).  At the conclusion of the procedure a ribbon type tissue marker clip was deployed into the biopsy  cavity.  Then, attention is turned to the right axilla.  Using sonogrpahic guidance and a 14 gauge automated biopsy device, biopsy was done of the right axillary mass (site 2 of 2).  No clip is placed.  Follow up 2 view mammogram was performed and dictated separately.  IMPRESSION: Ultrasound guided biopsy of a right breast mass (site 1 of 2) and abnormal right axillary node (site 2 of 2).  No apparent complications. Original Report Authenticated By: Britta Mccreedy, M.D.   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  Biopsy right axillary lymph node demonstrates ductal carcinoma, consistent with metastatic involvement.  Pathology results of both biopsies are concordant.  The patient was called with the biopsy results and reports. She reports no problems following the biopsy.  She is scheduled for multidisciplinary breast Clinic 05/31/2011, and breast  MRI on 05/26/2011.  **END ADDENDUM** SIGNED BY: Britta Mccreedy, M.D.    05/18/2011  *RADIOLOGY REPORT*  Clinical Data:  Right breast mass, abnormal right axillary node.  ULTRASOUND GUIDED CORE BIOPSY OF THE RIGHT BREAST AND RIGHT AXILLA  Comparison: 05/08/2011  I met with the patient and we discussed the procedure of ultrasound- guided biopsy, including benefits and alternatives.  We discussed the high likelihood of a successful procedure. We discussed the risks of the procedure, including infection, bleeding, tissue injury, clip migration, and inadequate sampling.  Informed written consent was given.  Using sterile technique 2% lidocaine, ultrasound guidance and a 14 gauge automated biopsy device, biopsy was done of the right breast mass (site 1 of 2).  At the conclusion of the procedure a ribbon type tissue marker clip was deployed into the biopsy cavity.  Then, attention is turned to the right axilla.  Using sonogrpahic  guidance and a 14 gauge automated biopsy device, biopsy was done of the right axillary mass (site 2 of 2).  No clip is placed.  Follow up 2 view mammogram was performed and dictated separately.  IMPRESSION: Ultrasound guided biopsy of a right breast mass (site 1 of 2) and abnormal right axillary node (site 2 of 2).  No apparent complications. Original Report Authenticated By: Britta Mccreedy, M.D.   Mm Digital Diag Ltd R  05/08/2011  *RADIOLOGY REPORT*  Clinical Data:  The patient for evaluation of possible distortion and calcifications in the outer portion of the right breast noted on screening study dated 05/01/2011.  DIGITAL DIAGNOSTIC RIGHT LIMITED MAMMOGRAM  AND RIGHT BREAST ULTRASOUND:  Comparison:  04/25/2010 from the [facility]. 07/03/2008 and 07/01/2007 from [facility] in [place], [place].  Findings:  Additional views demonstrate suspicious pleomorphic microcalcifications in the 9 o'clock position of the right breast posteriorly; these calcifications cover an  area of approximately 2.6 x 1.1 x 1.1 cm.  There is a suggestion of an underlying mass and distortion associated with these calcifications.  On physical exam, I palpate a firm mass at 9 o'clock 8 cm from the right nipple.  Ultrasound is performed, showing an irregular mass with calcifications at 9 o'clock 8 cm from the right nipple measuring 2.9 x 1.2 x 2.4 cm. There is an irregular mass contiguous with the first mass at 9 o'clock 4 cm from the right nipple measuring approximately 1.8 x 2.1 x 1.1 cm. Sonography of the right axilla demonstrates an abnormal lymph node measuring 0.9 x 0.6 x 0.9 cm. The appearance is suspicious for invasive mammary carcinoma with possible axillary nodal metastasis.  Ultrasound-guided core needle biopsy of the masses at 9 o'clock and the right axillary lymph node is suggested.  This has been scheduled for 05/18/2011.  IMPRESSION: Suspicious masses and  calcifications at 9 o'clock four and 8 cm from the right nipple.  Suspicious right axillary lymph node. Biopsy is suggested.  Ultrasound-guided core needle biopsy has been scheduled for 05/18/2011.  Dr. Chauncy Passy office was closed when this exam was completed.  BI-RADS CATEGORY 4:  Suspicious abnormality - biopsy should be considered.  Original Report Authenticated By: Daryl Eastern, M.D.   Mm Digital Diagnostic Unilat R  05/18/2011  *RADIOLOGY REPORT*  Clinical Data:  Right breast biopsy.  DIGITAL DIAGNOSTIC RIGHT MAMMOGRAM  Comparison:  Multiple priors  Findings:  Films are performed following ultrasound guided biopsy of a right breast mass.  Images show a ribbon type clip in the 9 o'clock position of the right breast, posteriorly.  Previously biopsied fibrocystic changes noted in the 8 o'clock position of the right breast, posteriorly denoted with a hook type clip.  IMPRESSION: Adequate clip placement following ultrasound guided right breast biopsy.  Original Report Authenticated By: Hiram Gash, M.D.   Mm Digital Screening  05/03/2011  DG SCREEN MAMMOGRAM BILATERAL Bilateral CC and MLO view(s) were taken.  DIGITAL SCREENING MAMMOGRAM WITH CAD:  Comparison:  Prior studies.  The breast tissue is extremely dense.  There are calcifications in the right breast.   Characterization with magnification views is recommended.  The left breast is unremarkable.  Images were processed with CAD.  IMPRESSION:  Calcifications, right breast.  Additional evaluation is indicated.  The patient will be contacted  for additional studies and a supplemental report will follow.  ASSESSMENT: Need additional imaging evaluation and/or prior mammograms for comparison - BI-RADS 0 - Right  Further imaging of the right breast. ,     LABS:    Chemistry      Component Value Date/Time   NA 138 05/31/2011 0815   K 3.7 05/31/2011 0815   CL 103 05/31/2011 0815   CO2 27 05/31/2011 0815   BUN 7 05/31/2011 0815   CREATININE 0.88  05/31/2011 0815      Component Value Date/Time   CALCIUM 9.4 05/31/2011 0815   ALKPHOS 78 05/31/2011 0815   AST 24 05/31/2011 0815   ALT 17 05/31/2011 0815   BILITOT 0.4 05/31/2011 0815      Lab Results  Component Value Date   WBC 4.1 05/31/2011   HGB 13.0 05/31/2011   HCT 38.0 05/31/2011   MCV 89.4 05/31/2011   PLT 166 05/31/2011       PATHOLOGY: ADDITIONAL INFORMATION: 1. 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 THE RATIO OF HER-2: CEP 17 SIGNALS WAS 1.41. Reference  range: Ratio: HER2:CEP17 < 1.8 - gene amplification not observed Ratio: HER2:CEP 17 1.8-2.2 - equivocal result Ratio: HER2:CEP17 > 2.2 - gene amplification observed Pecola Leisure MD Pathologist, Electronic Signature ( Signed 06/01/2011) FINAL DIAGNOSIS Diagnosis 1. Breast, right, needle core biopsy, 9 o'clock - HIGH GRADE DUCTAL CARCINOMA IN SITU - SEE COMMENT. 2. Lymph node, biopsy, Right axilla - POSITIVE FOR DUCTAL CARCINOMA. - SEE COMMENT. Microscopic Comment 1. An immunohistochemical stain panel is performed on the first 9 o'clock breast biopsy. Calponin, p63 and smooth muscle myosin heavy chain all show outside positive staining confirming the presence of a myoepithelial layer. The positive myoepithelial staining supports the lack of invasive carcinoma. The findings are called to [facility] on 05-22-2011. Dr. Mila Palmer has seen the first biopsy in consultation with agreement. 2. Sections from the right axilla biopsy are positive for ductal carcinoma. There is associated lymphoid 1 of 2 FINAL for Yamamoto, Shirely ([id number]) Microscopic Comment(continued) material with the carcinoma. The findings may represent metastatic ductal carcinoma to and axillary lymph node or direct extension of ductal carcinoma from the breast with a prominent lymphoid response. Please correlate with radiologic impression. The findings are called to [facility] on 05/22/11. Dr. Mila Palmer has seen this case in consultation with agreement. (RH:mw 05-22-11) Zandra Abts MD Pathologist, Electronic Signature (Case signed 05/22/2011) Specimen Gross and Clinical Information Specimen Comment 1. Mass and pleomorphic calcs; hx of biopsy in [place] showing fcc years ago. In formalin at 8:40 a.m., CIT < 60 seconds Specimen(s) Obtained: 1. Breast, right, needle core biopsy, 9 o'clock 2. Lymph node, biopsy, Right axilla Gross 1. Received in formalin are two cores and loose  ASSESSMENT    41 year old female with  #1 new diagnosis of right breast carcinoma. Patient had a high-grade ductal carcinoma in situ. MRI revealed a 5 cm area plus another mass. Patient is also noted to have contralateral findings. Biopsy for which has is being arranged. Patient does have a positive note that in fact shows an invasive ductal carcinoma that is high grade. This lymph node is replaced by tumor. Prognostic markers are not available at this time.  #2 patient is 24 with new diagnosis of breast cancer or so she will be referred to genetic counseling and testing per guidelines.     PLAN:    #1 patient was seen in the multidisciplinary breast clinic for discussion of treatment options. We personally reviewed the radiology, pathology, and the case was discussed at the multidisciplinary breast conference. It has been recommended that patient have biopsy of the left breast mass.  #2 patient will have genetic counseling and testing performed as well.  #3 patient will require neoadjuvant chemotherapy and this was discussed with the patient. The type of chemotherapy will be discussed based on patient's prognostic panel. If patient is HER-2/neu positive then we will plan on doing Herceptin based chemotherapy. However if she is HER-2/neu negative then she will receive taxine based chemotherapy. I discussed this in detail with the patient.  #4 patient will need  to have a biopsy of the contralateral breast. And this was discussed with the patient. She knows that she most likely will need a mastectomy of the right breast. However if she is found to have bilateral breast cancers then further discussions with the patient will take place to decide what is the best surgical option. Certainly patient will need radiation therapy as well. All of this was discussed with the patient.  #5 I will  plan on seeing the patient back after she has her Port-A-Cath placed and biopsy of the contralateral breast since we are going to be giving her neoadjuvant chemotherapy. In the meantime genetic counseling and testing will also be performed.     Thank you so much for allowing me to participate in the care of Carolyn [name]. I will continue to follow up the patient with you and assist in her care.  All questions were answered. The patient knows to call the clinic with any problems, questions or concerns. We can certainly see the patient much sooner if necessary.  I spent 60 minutes counseling the patient face to face. The total time spent in the appointment was 60 minutes.   Drue Second, MD Medical/Oncology [facility] [phone] (beeper) [phone] (Office)

## 2011-05-31 NOTE — Patient Instructions (Signed)
Biopsy of left breast area  PET/CT Chemotherapy class See mee in 3 weeks

## 2011-05-31 NOTE — Progress Notes (Signed)
Patient ID: Carolyn [name], female   DOB: 08/02/1970, 41 y.o.   MRN: [id number]  Chief Complaint  Patient presents with  . Breast Cancer    HPI Carolyn [name] is a 41 y.o. female.  She recently found a mass in the right breast lateral aspect. This was evaluated with a mammogram and ultrasound and core biopsy was done. This showed DCIS. However, a right axillary lymph node was also abnormal on ultrasound and a biopsy has shown metastatic carcinoma. Subsequently an MRI was done which has shown a second area in the upper inner quadrant in the right breast and A suspicious area in the left breast which has yet to be further evaluated. He has no other breast symptoms such as pain nipple discharge skin nipple changes symmetric. She has a negative family history. She comes the multidisciplinary conference today for evaluation and management. She has are seen a medical oncologist Has recommended neoadjuvant chemotherapy. The patient will require a Port-A-Cath for access. HPI  Past Medical History  Diagnosis Date  . Breast cancer     No past surgical history on file.  Family History  Problem Relation Age of Onset  . Breast cancer Maternal Aunt     Social History History  Substance Use Topics  . Smoking status: Never Smoker   . Smokeless tobacco: Not on file  . Alcohol Use: No    Allergies  Allergen Reactions  . Allegra (Fexofenadine) Hives    Current Outpatient Prescriptions  Medication Sig Dispense Refill  . cholecalciferol (VITAMIN D) 400 UNITS TABS Take 2,000 Units by mouth.      . Loratadine 10 MG CAPS Take 10 mg by mouth daily.      . Multiple Vitamin (MULTIVITAMIN) tablet Take 1 tablet by mouth daily.       No current facility-administered medications for this visit.   Facility-Administered Medications Ordered in Other Visits  Medication Dose Route Frequency Provider Last Rate Last Dose  . gadobenate dimeglumine (MULTIHANCE) injection 12 mL  12 mL Intravenous Once PRN Medication  Radiologist, MD   12 mL at 05/30/11 2003    Review of Systems Review of Systems  Constitutional: Negative for fever, chills and unexpected weight change.  HENT: Negative for hearing loss, congestion, sore throat, trouble swallowing and voice change.   Eyes: Negative for visual disturbance.  Respiratory: Negative for cough and wheezing.   Cardiovascular: Negative for chest pain, palpitations and leg swelling.  Gastrointestinal: Negative for nausea, vomiting, abdominal pain, diarrhea, constipation, blood in stool, abdominal distention and anal bleeding.  Genitourinary: Negative for hematuria, vaginal bleeding and difficulty urinating.  Musculoskeletal: Negative for arthralgias.  Skin: Negative for rash and wound.  Neurological: Negative for seizures, syncope and headaches.  Hematological: Negative for adenopathy. Does not bruise/bleed easily.  Psychiatric/Behavioral: Negative for confusion.    Blood pressure 120/72, pulse 80, temperature 98.5 F (36.9 C), resp. rate 20, weight 129 lb (58.514 kg).  Physical Exam VS: BP 120/72  Pulse 80  Temp 98.5 F (36.9 C)  Resp 20  Wt 129 lb (58.514 kg)  Physical Exam  Vitals reviewed. Constitutional: She is oriented to person, place, and time. She appears well-developed and well-nourished. No distress.  HENT:  Head: Normocephalic and atraumatic.  Mouth/Throat: Oropharynx is clear and moist.  Eyes: Conjunctivae and EOM are normal. Pupils are equal, round, and reactive to light. No scleral icterus.  Neck: Normal range of motion. Neck supple. No tracheal deviation present. No thyromegaly present.  Cardiovascular: Normal rate, regular rhythm, normal   heart sounds and intact distal pulses.  Exam reveals no gallop and no friction rub.   No murmur heard. Pulmonary/Chest: Effort normal and breath sounds normal. No respiratory distress. She has no wheezes. She has no rales.         Small hard mass in the upper inner quadrant and a larger hard area  laterally as noted on the drawing.  Abdominal: Soft. Bowel sounds are normal. She exhibits no distension and no mass. There is no tenderness. There is no rebound and no guarding.  Musculoskeletal: Normal range of motion. She exhibits no edema and no tenderness.  Neurological: She is alert and oriented to person, place, and time.  Skin: Skin is warm and dry. No rash noted. She is not diaphoretic. No erythema.  Psychiatric: She has a normal mood and affect. Her behavior is normal. Judgment and thought content normal.  Lymphatics: There's a question of a right axillary lymph node present. I did not appreciate any left axillary or supraclavicular adenopathy on either side.  Data Reviewed I have reviewed the patient's mammogram ultrasound and MRI with the radiologist and the pathology slides and reports with the pathologist.  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 She is scheduled for further evaluation of the abnormality on MRI of the left breast. She is also scheduled for genetic counseling and evaluation. Definitive surgical treatment of her breast cancer can be reassessed after her chemotherapy, but I think she will need a mastectomy on the right side regardless of clinical response since the 2 cancers are quite far apart.    CC: Dr [name]   Carolyn [name] 05/31/2011, 11:09 AM    

## 2011-05-31 NOTE — Progress Notes (Signed)
I saw the patient in the multidisciplinary breast clinic today and reviewed with her the distress screening.  She rated her distress at a "3".  I told her about the support services at the [facility], as well as [facility]; she asked that I make a [facility] referral on her behalf.

## 2011-06-01 ENCOUNTER — Other Ambulatory Visit: Payer: Self-pay | Admitting: Family Medicine

## 2011-06-01 DIAGNOSIS — R928 Other abnormal and inconclusive findings on diagnostic imaging of breast: Secondary | ICD-10-CM

## 2011-06-02 ENCOUNTER — Encounter: Payer: Self-pay | Admitting: *Deleted

## 2011-06-02 NOTE — Progress Notes (Signed)
Mailed after appt letter to pt. 

## 2011-06-06 ENCOUNTER — Telehealth: Payer: Self-pay | Admitting: *Deleted

## 2011-06-06 NOTE — Telephone Encounter (Signed)
Spoke to pt concerning BMDC from 5/22.  Pt denies questions or concerns regarding dx or treatment care plan.  Informed pt of positive ER/PR, negative Her2neu.  Confirmed appts for CT/PET, chemo class and left breast bx on 5/30, genetic counseling on 6/3, port placement on 6/11, and f/u with Dr. Welton Flakes on 6/14.  Encourage pt to call with needs.  Received verbal understanding.  Contact information given.

## 2011-06-08 ENCOUNTER — Ambulatory Visit
Admission: RE | Admit: 2011-06-08 | Discharge: 2011-06-08 | Disposition: A | Discharge status: Home / Self Care | Payer: BC Managed Care – PPO | Source: Ambulatory Visit | Attending: Family Medicine | Admitting: Family Medicine

## 2011-06-08 ENCOUNTER — Encounter: Payer: Self-pay | Admitting: *Deleted

## 2011-06-08 ENCOUNTER — Encounter (HOSPITAL_COMMUNITY)
Admission: RE | Admit: 2011-06-08 | Discharge: 2011-06-08 | Disposition: A | Discharge status: Home / Self Care | Payer: BC Managed Care – PPO | Source: Ambulatory Visit | Attending: Oncology | Admitting: Oncology

## 2011-06-08 ENCOUNTER — Other Ambulatory Visit: Payer: BC Managed Care – PPO

## 2011-06-08 ENCOUNTER — Other Ambulatory Visit: Payer: Self-pay | Admitting: Family Medicine

## 2011-06-08 DIAGNOSIS — R928 Other abnormal and inconclusive findings on diagnostic imaging of breast: Secondary | ICD-10-CM

## 2011-06-08 DIAGNOSIS — C773 Secondary and unspecified malignant neoplasm of axilla and upper limb lymph nodes: Secondary | ICD-10-CM | POA: Insufficient documentation

## 2011-06-08 DIAGNOSIS — D259 Leiomyoma of uterus, unspecified: Secondary | ICD-10-CM | POA: Insufficient documentation

## 2011-06-08 DIAGNOSIS — C50419 Malignant neoplasm of upper-outer quadrant of unspecified female breast: Secondary | ICD-10-CM

## 2011-06-08 DIAGNOSIS — K7689 Other specified diseases of liver: Secondary | ICD-10-CM | POA: Insufficient documentation

## 2011-06-08 MED ORDER — FLUDEOXYGLUCOSE F - 18 (FDG) INJECTION
16.1000 | Freq: Once | INTRAVENOUS | Status: AC | PRN
  Administered 2011-06-08: 16.1 via INTRAVENOUS

## 2011-06-08 MED ORDER — IOHEXOL 300 MG/ML  SOLN
100.0000 mL | Freq: Once | INTRAMUSCULAR | Status: AC | PRN
  Administered 2011-06-08: 100 mL via INTRAVENOUS

## 2011-06-09 ENCOUNTER — Other Ambulatory Visit: Payer: BC Managed Care – PPO

## 2011-06-12 ENCOUNTER — Ambulatory Visit (HOSPITAL_BASED_OUTPATIENT_CLINIC_OR_DEPARTMENT_OTHER): Payer: BC Managed Care – PPO | Admitting: Genetic Counselor

## 2011-06-12 ENCOUNTER — Ambulatory Visit
Admission: RE | Admit: 2011-06-12 | Discharge: 2011-06-12 | Disposition: A | Discharge status: Home / Self Care | Payer: BC Managed Care – PPO | Source: Ambulatory Visit | Attending: Family Medicine | Admitting: Family Medicine

## 2011-06-12 ENCOUNTER — Other Ambulatory Visit: Payer: BC Managed Care – PPO

## 2011-06-12 DIAGNOSIS — R928 Other abnormal and inconclusive findings on diagnostic imaging of breast: Secondary | ICD-10-CM

## 2011-06-12 DIAGNOSIS — Z803 Family history of malignant neoplasm of breast: Secondary | ICD-10-CM

## 2011-06-12 DIAGNOSIS — C50419 Malignant neoplasm of upper-outer quadrant of unspecified female breast: Secondary | ICD-10-CM

## 2011-06-12 MED ORDER — GADOBENATE DIMEGLUMINE 529 MG/ML IV SOLN
12.0000 mL | Freq: Once | INTRAVENOUS | Status: AC | PRN
  Administered 2011-06-12: 12 mL via INTRAVENOUS

## 2011-06-12 NOTE — Progress Notes (Addendum)
Dr.  Welton Flakes requested a consultation for genetic counseling and risk assessment for Carolyn [name], a 41 y.o. female, for discussion of her personal history of breast cacner. She presents to clinic today with her sister Carolyn [name], to discuss the possibility of a genetic predisposition to cancer, and to further clarify her risks, as well as her family members' risks for cancer.   HISTORY OF PRESENT ILLNESS: In May 2013, at the age of 57, Carolyn [name] was diagnosed with invasive ductal carcinoma of the breast.    Past Medical History  Diagnosis Date  . Breast cancer     No past surgical history on file.  History  Substance Use Topics  . Smoking status: Never Smoker   . Smokeless tobacco: Not on file  . Alcohol Use: No    REPRODUCTIVE HISTORY AND PERSONAL RISK ASSESSMENT FACTORS: Menarche was at age 21.   Premenopausal Uterus Intact: Yes Ovaries Intact: Yes [id number] , first live birth at age N/A  She has not previously undergone treatment for infertility.   Never used OCPs   She has not used HRT in the past.    FAMILY HISTORY:  We obtained a detailed, 4-generation family history.  Significant diagnoses are listed below: Family History  Problem Relation Age of Onset  . Breast cancer Maternal Aunt   the patient was diagnosed with breast cancer at age 33.  Her mother's fraternal twin sister has a daughter who died of ovarian cancer at age 28.  She has a maternal aunt who had breast cancer at age 69 and another aunt with possible pancreatic cancer in her 7s.  Her maternal grandfather was diagnosed with pancreatic cancer at an unknown age.    The patient's father is alive at 57.  He has 4 siblings, and does not have any reported cancer family history.  Patient's maternal ancestors are of [place] American and possible American [place] descent, and paternal ancestors are of African American descent. There is no reported Ashkenazi Jewish ancestry. There is no known consanguinity.  GENETIC  COUNSELING RISK ASSESSMENT, DISCUSSION, AND SUGGESTED FOLLOW UP: We reviewed the natural history and genetic etiology of sporadic, familial and hereditary cancer syndromes.  About 5-10% of breast cancer is hereditary.  Of this, about 85% is the result of a BRCA1 or BRCA2 mutation.  We reviewed the red flags of hereditary cancer syndromes and the dominant inheritance patterns.  If the BRCA testing is negative, we discussed that we could be testing for the wrong gene.  We discussed gene panels, and that several cancer genes that are associated with different cancers can be tested at the same time.  Because of the different types of cancer that are in the patient's family, we will consider one of the panel tests if she is negative for BRCA mutations.  The patient's personal and family history is suggestive of the following possible diagnosis: hereditary cancer syndrome  We discussed that identification of a hereditary cancer syndrome may help her care providers tailor the patients medical management. If a mutation indicating a hereditary cancer syndrome is detected in this case, the [employer] recommendations would include increased cancer surveillance and possible prophylactic sugery. If a mutation is detected, the patient will be referred back to the referring provider and to any additional appropriate care providers to discuss the relevant options.   If a mutation is not found in the patient, this will decrease the likelihood of hereditary cancer syndrome as the explanation for her breast  cancer. Cancer surveillance options would be discussed for the patient according to the appropriate standard National Comprehensive Cancer Network and American Cancer Society guidelines, with consideration of their personal and family history risk factors. In this case, the patient will be referred back to their care providers for discussions of management.   In order to estimate her chance of  having a BRCA1 or BRCA2 mutation, we used statistical models (Penn II) and laboratory data that take into account her personal medical history, family history and ancestry.  Because each model is different, there can be a lot of variability in the risks they give.  Therefore, these numbers must be considered a rough range and not a precise risk of having a BRCA1 or BRCA2 mutation.  These models estimate that she has approximately a 26% chance of having a mutation. Based on this assessment of her family and personal history, genetic testing is recommended.  After considering the risks, benefits, and limitations, the patient provided informed consent for  the following  testing: [facility] through [facility].   Per the patient's request, we will contact her by telephone to discuss these results. A follow up genetic counseling visit will be scheduled if indicated.  The patient was seen for a total of 60 minutes, greater than 50% of which was spent face-to-face counseling.  This plan is being carried out per Dr. Milta Deiters recommendations.  This note will also be sent to the referring provider via the electronic medical record. The patient will be supplied with a summary of this genetic counseling discussion as well as educational information on the discussed hereditary cancer syndromes following the conclusion of their visit.   Patient was discussed with Dr. Marikay Alar Magrinat in Dr. Feliz Beam absence.  EDUCATIONAL INFORMATION SUPPLIED TO PATIENT AT ENCOUNTER:  Hereditary breast and ovarian cancer syndrome booklet    _______________________________________________________________________ For Office Staff:  Number of people involved in session: 3 Was an Intern/ student involved with case: not applicable

## 2011-06-14 ENCOUNTER — Encounter (HOSPITAL_COMMUNITY): Payer: Self-pay | Admitting: Respiratory Therapy

## 2011-06-19 ENCOUNTER — Encounter: Payer: Self-pay | Admitting: Genetic Counselor

## 2011-06-19 ENCOUNTER — Encounter (HOSPITAL_COMMUNITY): Payer: Self-pay | Admitting: *Deleted

## 2011-06-19 MED ORDER — CHLORHEXIDINE GLUCONATE 4 % EX LIQD: 1.0000 "application " | Freq: Once | CUTANEOUS | Status: DC

## 2011-06-19 MED ORDER — CEFAZOLIN SODIUM 1-5 GM-% IV SOLN
1.0000 g | INTRAVENOUS | Status: AC
  Administered 2011-06-20: 1 g via INTRAVENOUS
  Filled 2011-06-19: qty 50

## 2011-06-20 ENCOUNTER — Ambulatory Visit (HOSPITAL_COMMUNITY): Payer: BC Managed Care – PPO

## 2011-06-20 ENCOUNTER — Telehealth (INDEPENDENT_AMBULATORY_CARE_PROVIDER_SITE_OTHER): Payer: Self-pay | Admitting: General Surgery

## 2011-06-20 ENCOUNTER — Encounter (HOSPITAL_COMMUNITY): Payer: Self-pay | Admitting: Anesthesiology

## 2011-06-20 ENCOUNTER — Encounter (HOSPITAL_COMMUNITY)
Admission: RE | Disposition: A | Discharge status: Home Health | Payer: Self-pay | Source: Ambulatory Visit | Attending: Surgery

## 2011-06-20 ENCOUNTER — Encounter (HOSPITAL_COMMUNITY): Payer: Self-pay | Admitting: *Deleted

## 2011-06-20 ENCOUNTER — Ambulatory Visit (HOSPITAL_COMMUNITY)
Admission: RE | Admit: 2011-06-20 | Discharge: 2011-06-20 | Disposition: A | Discharge status: Home Health | Payer: BC Managed Care – PPO | Source: Ambulatory Visit | Attending: Surgery | Admitting: Surgery

## 2011-06-20 ENCOUNTER — Ambulatory Visit (HOSPITAL_COMMUNITY): Payer: BC Managed Care – PPO | Admitting: Anesthesiology

## 2011-06-20 DIAGNOSIS — C50919 Malignant neoplasm of unspecified site of unspecified female breast: Secondary | ICD-10-CM

## 2011-06-20 DIAGNOSIS — C50419 Malignant neoplasm of upper-outer quadrant of unspecified female breast: Secondary | ICD-10-CM

## 2011-06-20 DIAGNOSIS — C773 Secondary and unspecified malignant neoplasm of axilla and upper limb lymph nodes: Secondary | ICD-10-CM | POA: Insufficient documentation

## 2011-06-20 DIAGNOSIS — C50219 Malignant neoplasm of upper-inner quadrant of unspecified female breast: Secondary | ICD-10-CM | POA: Insufficient documentation

## 2011-06-20 HISTORY — PX: PORTACATH PLACEMENT: SHX2246

## 2011-06-20 LAB — CBC
HCT: 36 % (ref 36.0–46.0)
Hemoglobin: 12.3 g/dL (ref 12.0–15.0)
MCV: 87.6 fL (ref 78.0–100.0)
RBC: 4.11 MIL/uL (ref 3.87–5.11)
RDW: 12.1 % (ref 11.5–15.5)
WBC: 4.1 10*3/uL (ref 4.0–10.5)

## 2011-06-20 SURGERY — INSERTION, TUNNELED CENTRAL VENOUS DEVICE, WITH PORT
Anesthesia: General

## 2011-06-20 MED ORDER — PROPOFOL 10 MG/ML IV EMUL
INTRAVENOUS | Status: DC | PRN
  Administered 2011-06-20: 180 mL via INTRAVENOUS

## 2011-06-20 MED ORDER — PHENYLEPHRINE HCL 10 MG/ML IJ SOLN
INTRAMUSCULAR | Status: DC | PRN
  Administered 2011-06-20 (×4): 80 ug via INTRAVENOUS

## 2011-06-20 MED ORDER — LIDOCAINE HCL (CARDIAC) 20 MG/ML IV SOLN
INTRAVENOUS | Status: DC | PRN
  Administered 2011-06-20: 70 mg via INTRAVENOUS

## 2011-06-20 MED ORDER — ACETAMINOPHEN 10 MG/ML IV SOLN
INTRAVENOUS | Status: AC
  Filled 2011-06-20: qty 100

## 2011-06-20 MED ORDER — SODIUM CHLORIDE 0.9 % IR SOLN
Status: DC | PRN
  Administered 2011-06-20: 10:00:00

## 2011-06-20 MED ORDER — DEXTROSE 5 % IV SOLN
INTRAVENOUS | Status: DC | PRN
  Administered 2011-06-20 (×2): via INTRAVENOUS

## 2011-06-20 MED ORDER — LACTATED RINGERS IV SOLN
INTRAVENOUS | Status: DC | PRN
  Administered 2011-06-20: 09:00:00 via INTRAVENOUS

## 2011-06-20 MED ORDER — MIDAZOLAM HCL 5 MG/5ML IJ SOLN
INTRAMUSCULAR | Status: DC | PRN
  Administered 2011-06-20: 1 mg via INTRAVENOUS

## 2011-06-20 MED ORDER — ONDANSETRON HCL 4 MG/2ML IJ SOLN: 4.0000 mg | Freq: Once | INTRAMUSCULAR | Status: DC | PRN

## 2011-06-20 MED ORDER — HYDROCODONE-ACETAMINOPHEN 5-325 MG PO TABS
ORAL_TABLET | ORAL | Status: AC
  Filled 2011-06-20: qty 1

## 2011-06-20 MED ORDER — IOHEXOL 300 MG/ML  SOLN
INTRAMUSCULAR | Status: DC | PRN
  Administered 2011-06-20: 50 mL via INTRAVENOUS

## 2011-06-20 MED ORDER — HYDROMORPHONE HCL PF 1 MG/ML IJ SOLN: 0.2500 mg | INTRAMUSCULAR | Status: DC | PRN

## 2011-06-20 MED ORDER — HYDROCODONE-ACETAMINOPHEN 5-325 MG PO TABS
1.0000 | ORAL_TABLET | Freq: Once | ORAL | Status: AC
  Administered 2011-06-20: 1 via ORAL

## 2011-06-20 MED ORDER — DEXAMETHASONE SODIUM PHOSPHATE 10 MG/ML IJ SOLN
INTRAMUSCULAR | Status: DC | PRN
  Administered 2011-06-20: 4 mg via INTRAVENOUS

## 2011-06-20 MED ORDER — MUPIROCIN 2 % EX OINT
TOPICAL_OINTMENT | CUTANEOUS | Status: AC
  Filled 2011-06-20: qty 22

## 2011-06-20 MED ORDER — FENTANYL CITRATE 0.05 MG/ML IJ SOLN
INTRAMUSCULAR | Status: DC | PRN
  Administered 2011-06-20: 75 ug via INTRAVENOUS

## 2011-06-20 MED ORDER — 0.9 % SODIUM CHLORIDE (POUR BTL) OPTIME
TOPICAL | Status: DC | PRN
  Administered 2011-06-20: 1000 mL

## 2011-06-20 MED ORDER — ACETAMINOPHEN 10 MG/ML IV SOLN
INTRAVENOUS | Status: DC | PRN
  Administered 2011-06-20: 1000 mg via INTRAVENOUS

## 2011-06-20 MED ORDER — HYDROCODONE-ACETAMINOPHEN 5-325 MG PO TABS: 1.0000 | ORAL_TABLET | ORAL | Status: AC | PRN

## 2011-06-20 MED ORDER — HEPARIN SOD (PORK) LOCK FLUSH 100 UNIT/ML IV SOLN
INTRAVENOUS | Status: DC | PRN
  Administered 2011-06-20: 500 [IU] via INTRAVENOUS

## 2011-06-20 MED ORDER — ONDANSETRON HCL 4 MG/2ML IJ SOLN
INTRAMUSCULAR | Status: DC | PRN
  Administered 2011-06-20: 4 mg via INTRAVENOUS

## 2011-06-20 SURGICAL SUPPLY — 56 items
BAG DECANTER FOR FLEXI CONT (MISCELLANEOUS) ×2 IMPLANT
BLADE SURG 15 STRL LF DISP TIS (BLADE) ×1 IMPLANT
BLADE SURG 15 STRL SS (BLADE) ×1
CANISTER SUCTION 2500CC (MISCELLANEOUS) IMPLANT
CHLORAPREP W/TINT 10.5 ML (MISCELLANEOUS) ×2 IMPLANT
CLOTH BEACON ORANGE TIMEOUT ST (SAFETY) ×2 IMPLANT
COVER PROBE W GEL 5X96 (DRAPES) ×2 IMPLANT
COVER SURGICAL LIGHT HANDLE (MISCELLANEOUS) ×2 IMPLANT
CRADLE DONUT ADULT HEAD (MISCELLANEOUS) ×2 IMPLANT
DECANTER SPIKE VIAL GLASS SM (MISCELLANEOUS) ×2 IMPLANT
DERMABOND ADVANCED (GAUZE/BANDAGES/DRESSINGS) ×1
DERMABOND ADVANCED .7 DNX12 (GAUZE/BANDAGES/DRESSINGS) ×1 IMPLANT
DRAPE C-ARM 42X72 X-RAY (DRAPES) ×2 IMPLANT
DRAPE LAPAROTOMY TRNSV 102X78 (DRAPE) ×2 IMPLANT
DRAPE UTILITY 15X26 W/TAPE STR (DRAPE) ×4 IMPLANT
ELECT CAUTERY BLADE 6.4 (BLADE) ×2 IMPLANT
ELECT REM PT RETURN 9FT ADLT (ELECTROSURGICAL) ×2
ELECTRODE REM PT RTRN 9FT ADLT (ELECTROSURGICAL) ×1 IMPLANT
GAUZE SPONGE 4X4 16PLY XRAY LF (GAUZE/BANDAGES/DRESSINGS) ×2 IMPLANT
GLOVE BIOGEL PI IND STRL 6.5 (GLOVE) ×1 IMPLANT
GLOVE BIOGEL PI IND STRL 7.0 (GLOVE) ×1 IMPLANT
GLOVE BIOGEL PI INDICATOR 6.5 (GLOVE) ×1
GLOVE BIOGEL PI INDICATOR 7.0 (GLOVE) ×1
GLOVE ECLIPSE 6.5 STRL STRAW (GLOVE) ×2 IMPLANT
GLOVE EUDERMIC 7 POWDERFREE (GLOVE) ×2 IMPLANT
GOWN PREVENTION PLUS XLARGE (GOWN DISPOSABLE) ×2 IMPLANT
GOWN STRL NON-REIN LRG LVL3 (GOWN DISPOSABLE) ×6 IMPLANT
INTRODUCER 13FR (MISCELLANEOUS) IMPLANT
INTRODUCER COOK 11FR (CATHETERS) IMPLANT
KIT BASIN OR (CUSTOM PROCEDURE TRAY) ×2 IMPLANT
KIT PORT POWER 9.6FR MRI PREA (Catheter) IMPLANT
KIT PORT POWER ISP 8FR (Catheter) IMPLANT
KIT POWER CATH 8FR (Catheter) IMPLANT
KIT POWER PORT SLIM 6FR (PORTABLE EQUIPMENT SUPPLIES) ×2 IMPLANT
KIT ROOM TURNOVER OR (KITS) ×2 IMPLANT
MARKER SKIN DUAL TIP RULER LAB (MISCELLANEOUS) ×2 IMPLANT
NEEDLE HYPO 25GX1X1/2 BEV (NEEDLE) ×2 IMPLANT
NS IRRIG 1000ML POUR BTL (IV SOLUTION) ×2 IMPLANT
PACK SURGICAL SETUP 50X90 (CUSTOM PROCEDURE TRAY) ×2 IMPLANT
PAD ARMBOARD 7.5X6 YLW CONV (MISCELLANEOUS) ×2 IMPLANT
PENCIL BUTTON HOLSTER BLD 10FT (ELECTRODE) ×2 IMPLANT
Power port slim ×2 IMPLANT
SET INTRODUCER 12FR PACEMAKER (SHEATH) IMPLANT
SET SHEATH INTRODUCER 10FR (MISCELLANEOUS) IMPLANT
SHEATH COOK PEEL AWAY SET 9F (SHEATH) IMPLANT
SUT MNCRL AB 4-0 PS2 18 (SUTURE) ×2 IMPLANT
SUT PROLENE 2 0 SH DA (SUTURE) ×2 IMPLANT
SUT VIC AB 3-0 SH 18 (SUTURE) ×2 IMPLANT
SYR 20ML ECCENTRIC (SYRINGE) ×6 IMPLANT
SYR 5ML LUER SLIP (SYRINGE) ×2 IMPLANT
SYR CONTROL 10ML LL (SYRINGE) ×2 IMPLANT
TOWEL OR 17X24 6PK STRL BLUE (TOWEL DISPOSABLE) ×2 IMPLANT
TOWEL OR 17X26 10 PK STRL BLUE (TOWEL DISPOSABLE) ×2 IMPLANT
TUBE CONNECTING 12X1/4 (SUCTIONS) ×2 IMPLANT
WATER STERILE IRR 1000ML POUR (IV SOLUTION) IMPLANT
YANKAUER SUCT BULB TIP NO VENT (SUCTIONS) ×2 IMPLANT

## 2011-06-20 NOTE — Telephone Encounter (Signed)
Called into [facility] [phone] on [address]  [facility] and spoke to Joe and called in Norco 5/325 #15 1q 4-6 hrs prn for pain.Marland Kitchenand pt is aware

## 2011-06-20 NOTE — Op Note (Signed)
[facility] C Brandenburger 1970/07/26 [id number] 06/01/2011  Preoperative diagnosis: PAC needed  Postoperative diagnosis: Same  Procedure: Portacath Placement  Surgeon: [name], MD, FACS  Anesthesia: General  Clinical History and Indications: The patient is getting ready to begin chemotherapy for her cancer. She  needs a Port-A-Cath for venous access.  Description of Procedure: I have seen the patient in the holding area and confirmed the plans for the procedure as noted above. I reviewed the risks and complications again and the patient has no further questions.  The patient was then taken to the operating room. After satisfactory general anesthesia had been obtained the upper chest and lower neck were prepped and draped as a sterile field. The timeout was done.  The left subclavian vein was entered and the guidewire threaded into the superior vena cava right atrial area under fluoroscopic guidance. It initially went into the L. IJ.Marland Kitchen An incision was then made on the anterior chest wall and a subcutaneous pocket fashioned for the port reservoir.  The port tubing was then brought through a subcutaneous tunnel from the port site to the guidewire site. The dilator and peel-away sheath were then advanced over the guidewire while monitoring this with fluoroscopy. The guidewire and dilator were removed and the tubing threaded to approximately 22 cm. The peel-away sheath was then removed. The catheter aspirated and flushed easily. Using fluoroscopy the tip was backed out into the superior vena cava right atrial junction area. It aspirated and flushed easily. The reservoir was attached and the locking mechanism engaged. That aspirated and flushed easily.  The reservoir was secured to the fascia with 2 sutures of 2-0 Prolene. A final check with fluoroscopy was done to make sure we had no kinks and good positioning of the tip of the catheter. Everything appeared to be okay. The catheter was aspirated,  flushed with dilute heparin and then concentrated aqueous heparin.  The incision was then closed with interrupted 3-0 Vicryl, and 4-0 Monocryl subcuticular with Dermabond on the skin.  There were no operative complications. Estimated blood loss was minimal. All counts were correct. The patient tolerated the procedure well.  The Power port slim port with 6 Fr catheter was used  [name], MD, FACS 06/20/2011 10:10 AM

## 2011-06-20 NOTE — Preoperative (Signed)
Beta Blockers   Reason not to administer Beta Blockers:Not Applicable 

## 2011-06-20 NOTE — Anesthesia Preprocedure Evaluation (Addendum)
Anesthesia Evaluation  Patient identified by MRN, date of birth, ID band Patient awake    Reviewed: Allergy & Precautions, H&P , NPO status , Patient's Chart, lab work & pertinent test results  Airway Mallampati: I TM Distance: >3 FB Neck ROM: full    Dental  (+) Teeth Intact   Pulmonary neg pulmonary ROS,    Pulmonary exam normal       Cardiovascular negative cardio ROS  Rhythm:regular Rate:Normal     Neuro/Psych    GI/Hepatic negative GI ROS, Neg liver ROS,   Endo/Other  negative endocrine ROS  Renal/GU negative Renal ROS     Musculoskeletal negative musculoskeletal ROS (+)   Abdominal   Peds  Hematology negative hematology ROS (+)   Anesthesia Other Findings   Reproductive/Obstetrics negative OB ROS                         Anesthesia Physical Anesthesia Plan  ASA: I  Anesthesia Plan: General   Post-op Pain Management:    Induction: Intravenous  Airway Management Planned: LMA  Additional Equipment:   Intra-op Plan:   Post-operative Plan: Extubation in OR  Informed Consent: I have reviewed the patients History and Physical, chart, labs and discussed the procedure including the risks, benefits and alternatives for the proposed anesthesia with the patient or authorized representative who has indicated his/her understanding and acceptance.     Plan Discussed with: CRNA, Anesthesiologist and Surgeon  Anesthesia Plan Comments:         Anesthesia Quick Evaluation

## 2011-06-20 NOTE — Anesthesia Postprocedure Evaluation (Signed)
  Anesthesia Post-op Note  Patient: Carolyn [name]  Procedure(s) Performed: Procedure(s) (LRB): INSERTION PORT-A-CATH (N/A)  Patient Location: PACU  Anesthesia Type: General  Level of Consciousness: awake, alert , oriented and patient cooperative  Airway and Oxygen Therapy: Patient Spontanous Breathing and Patient connected to nasal cannula oxygen  Post-op Pain: mild  Post-op Assessment: Post-op Vital signs reviewed, Patient's Cardiovascular Status Stable, Respiratory Function Stable, Patent Airway, No signs of Nausea or vomiting and Pain level controlled  Post-op Vital Signs: stable  Complications: No apparent anesthesia complications

## 2011-06-20 NOTE — Transfer of Care (Signed)
Immediate Anesthesia Transfer of Care Note  Patient: Carolyn [name]  Procedure(s) Performed: Procedure(s) (LRB): INSERTION PORT-A-CATH (N/A)  Patient Location: PACU  Anesthesia Type: General  Level of Consciousness: awake and patient cooperative  Airway & Oxygen Therapy: Patient Spontanous Breathing and Patient connected to nasal cannula oxygen  Post-op Assessment: Report given to PACU RN, Post -op Vital signs reviewed and stable and Patient moving all extremities X 4  Post vital signs: stable  Complications: No apparent anesthesia complications

## 2011-06-20 NOTE — Discharge Instructions (Signed)

## 2011-06-20 NOTE — H&P (View-Only) (Signed)
Patient ID: Carolyn [name], female   DOB: Sep 27, 1970, 41 y.o.   MRN: [id number]  Chief Complaint  Patient presents with  . Breast Cancer    HPI Carolyn [name] is a 41 y.o. female.  She recently found a mass in the right breast lateral aspect. This was evaluated with a mammogram and ultrasound and core biopsy was done. This showed DCIS. However, a right axillary lymph node was also abnormal on ultrasound and a biopsy has shown metastatic carcinoma. Subsequently an MRI was done which has shown a second area in the upper inner quadrant in the right breast and A suspicious area in the left breast which has yet to be further evaluated. He has no other breast symptoms such as pain nipple discharge skin nipple changes symmetric. She has a negative family history. She comes the multidisciplinary conference today for evaluation and management. She has are seen a medical oncologist Has recommended neoadjuvant chemotherapy. The patient will require a Port-A-Cath for access. HPI  Past Medical History  Diagnosis Date  . Breast cancer     No past surgical history on file.  Family History  Problem Relation Age of Onset  . Breast cancer Maternal Aunt     Social History History  Substance Use Topics  . Smoking status: Never Smoker   . Smokeless tobacco: Not on file  . Alcohol Use: No    Allergies  Allergen Reactions  . Allegra (Fexofenadine) Hives    Current Outpatient Prescriptions  Medication Sig Dispense Refill  . cholecalciferol (VITAMIN D) 400 UNITS TABS Take 2,000 Units by mouth.      . Loratadine 10 MG CAPS Take 10 mg by mouth daily.      . Multiple Vitamin (MULTIVITAMIN) tablet Take 1 tablet by mouth daily.       No current facility-administered medications for this visit.   Facility-Administered Medications Ordered in Other Visits  Medication Dose Route Frequency Provider Last Rate Last Dose  . gadobenate dimeglumine (MULTIHANCE) injection 12 mL  12 mL Intravenous Once PRN Medication  Radiologist, MD   12 mL at 05/30/11 2003    Review of Systems Review of Systems  Constitutional: Negative for fever, chills and unexpected weight change.  HENT: Negative for hearing loss, congestion, sore throat, trouble swallowing and voice change.   Eyes: Negative for visual disturbance.  Respiratory: Negative for cough and wheezing.   Cardiovascular: Negative for chest pain, palpitations and leg swelling.  Gastrointestinal: Negative for nausea, vomiting, abdominal pain, diarrhea, constipation, blood in stool, abdominal distention and anal bleeding.  Genitourinary: Negative for hematuria, vaginal bleeding and difficulty urinating.  Musculoskeletal: Negative for arthralgias.  Skin: Negative for rash and wound.  Neurological: Negative for seizures, syncope and headaches.  Hematological: Negative for adenopathy. Does not bruise/bleed easily.  Psychiatric/Behavioral: Negative for confusion.    Blood pressure 120/72, pulse 80, temperature 98.5 F (36.9 C), resp. rate 20, weight 129 lb (58.514 kg).  Physical Exam VS: BP 120/72  Pulse 80  Temp 98.5 F (36.9 C)  Resp 20  Wt 129 lb (58.514 kg)  Physical Exam  Vitals reviewed. Constitutional: She is oriented to person, place, and time. She appears well-developed and well-nourished. No distress.  HENT:  Head: Normocephalic and atraumatic.  Mouth/Throat: Oropharynx is clear and moist.  Eyes: Conjunctivae and EOM are normal. Pupils are equal, round, and reactive to light. No scleral icterus.  Neck: Normal range of motion. Neck supple. No tracheal deviation present. No thyromegaly present.  Cardiovascular: Normal rate, regular rhythm, normal  heart sounds and intact distal pulses.  Exam reveals no gallop and no friction rub.   No murmur heard. Pulmonary/Chest: Effort normal and breath sounds normal. No respiratory distress. She has no wheezes. She has no rales.         Small hard mass in the upper inner quadrant and a larger hard area  laterally as noted on the drawing.  Abdominal: Soft. Bowel sounds are normal. She exhibits no distension and no mass. There is no tenderness. There is no rebound and no guarding.  Musculoskeletal: Normal range of motion. She exhibits no edema and no tenderness.  Neurological: She is alert and oriented to person, place, and time.  Skin: Skin is warm and dry. No rash noted. She is not diaphoretic. No erythema.  Psychiatric: She has a normal mood and affect. Her behavior is normal. Judgment and thought content normal.  Lymphatics: There's a question of a right axillary lymph node present. I did not appreciate any left axillary or supraclavicular adenopathy on either side.  Data Reviewed I have reviewed the patient's mammogram ultrasound and MRI with the radiologist and the pathology slides and reports with the pathologist.  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 She is scheduled for further evaluation of the abnormality on MRI of the left breast. She is also scheduled for genetic counseling and evaluation. Definitive surgical treatment of her breast cancer can be reassessed after her chemotherapy, but I think she will need a mastectomy on the right side regardless of clinical response since the 2 cancers are quite far apart.    CC: Dr Agustin Cree 05/31/2011, 11:09 AM

## 2011-06-20 NOTE — Interval H&P Note (Signed)
History and Physical Interval Note:  06/20/2011 8:56 AM  Carolyn [name]  has presented today for surgery, with the diagnosis of BREAST CANCER  The various methods of treatment have been discussed with the patient and family. After consideration of risks, benefits and other options for treatment, the patient has consented to  Procedure(s) (LRB): INSERTION PORT-A-CATH (N/A) as a surgical intervention .  The patients' history has been reviewed, patient examined, no change in status, stable for surgery.  I have reviewed the patients' chart and labs.  Questions were answered to the patient's satisfaction.  Left breast Bx turned out benign. Patient not yet scheduled for chemotherapy.   [name]

## 2011-06-22 ENCOUNTER — Encounter (HOSPITAL_COMMUNITY): Payer: Self-pay | Admitting: Surgery

## 2011-06-23 ENCOUNTER — Encounter: Payer: Self-pay | Admitting: Oncology

## 2011-06-23 ENCOUNTER — Ambulatory Visit (HOSPITAL_BASED_OUTPATIENT_CLINIC_OR_DEPARTMENT_OTHER): Payer: BC Managed Care – PPO | Admitting: Oncology

## 2011-06-23 VITALS — BP 120/77 | HR 99 | Temp 99.0°F | Wt 125.0 lb

## 2011-06-23 DIAGNOSIS — D0590 Unspecified type of carcinoma in situ of unspecified breast: Secondary | ICD-10-CM

## 2011-06-23 DIAGNOSIS — C773 Secondary and unspecified malignant neoplasm of axilla and upper limb lymph nodes: Secondary | ICD-10-CM

## 2011-06-23 DIAGNOSIS — C50419 Malignant neoplasm of upper-outer quadrant of unspecified female breast: Secondary | ICD-10-CM

## 2011-06-23 DIAGNOSIS — Z170 Estrogen receptor positive status [ER+]: Secondary | ICD-10-CM

## 2011-06-23 NOTE — Progress Notes (Signed)
OFFICE PROGRESS NOTE  CC  [name],[name], MD [address], [address] [place] [address] Dr. Cyndia Bent Dr. 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 She had an ultrasound that revealed an area ofconcern measuring 2.9 cm. She also was found to have a right axillary lymph node that was suspicious for malignancy. Patient went on to have biopsy of the calcifications that showed high-grade ductal carcinoma in situ. Biopsy of the right axillary lymph node revealed an invasive ductal carcinoma. The tumor was ER positive PR positive HER-2/neu negative with a Ki-67 of 14%.    #2 on MRI patient was also found to have other areas of concern in the contralateral breast. Also was noted that in the right breast the tumor abutted the chest wall.since then patient has gone on to have biopsies performed at the other areas that are negative for a malignancy. The pathology  is shown below.  CURRENT THERAPY:patient will begin neoadjuvant chemotherapy consisting of Taxotere and Cytoxan on 06/27/2011.  INTERVAL HISTORY: Carolyn [name] 41 y.o. female returns for followup visit after having had her biopsies performed of the other suspicious areas. Clinically patient seems to be doing well. She also had a Port-A-Cath placed as she is going to be starting neoadjuvant chemotherapy. She today denies any fevers chills night sweats headaches shortness of breath chest pains palpitations she is quite anxious to get her treatments started. She has no nausea or vomiting or bleeding problems. Remainder of the 10  point review of systems is negative.  MEDICAL HISTORY: Past Medical History  Diagnosis Date  . Breast cancer     ALLERGIES:  is allergic to allegra and shellfish allergy.  MEDICATIONS:  Current Outpatient Prescriptions  Medication Sig Dispense Refill  . Cholecalciferol (VITAMIN D3) 2000 UNITS TABS Take 2,000 Units by mouth daily.      Marland Kitchen HYDROcodone-acetaminophen (NORCO) 5-325 MG per tablet Take 1 tablet by mouth every 4 (four) hours as needed for pain.  15 tablet  0  . Loratadine 10 MG CAPS Take 10 mg by mouth daily.      . Multiple Vitamin (MULTIVITAMIN) tablet Take 1 tablet by mouth daily.        SURGICAL HISTORY:  Past Surgical History  Procedure Date  . Wisdom tooth extraction   . Breast biopsy     Left  . Portacath placement 06/20/2011    Procedure: INSERTION PORT-A-CATH;  Surgeon: [name], MD;  Location: [facility] OR;  Service: General;  Laterality: N/A;    REVIEW OF SYSTEMS:  Pertinent items are noted in HPI.   PHYSICAL EXAMINATION: General appearance: alert, cooperative and appears stated age Lymph nodes: Cervical, supraclavicular, and axillary nodes normal. Resp: clear to auscultation bilaterally and normal percussion bilaterally Back: symmetric, no curvature. ROM normal. No CVA tenderness. Cardio: regular rate and rhythm, S1, S2 normal, no murmur, click, rub or gallop GI: soft, non-tender; bowel sounds normal; no masses,  no organomegaly Extremities: extremities normal, atraumatic, no cyanosis or edema Neurologic: Grossly normal Breast examination right breast reveals a palpable mass left breast no masses but there is nodularity noted right axillary lymph node is palpable exam  ECOG PERFORMANCE STATUS: 0 - Asymptomatic  Blood pressure 120/77, pulse 99, temperature 99 F (37.2 [name]), temperature source Oral, weight 125 lb (56.7 kg), last menstrual period 06/03/2011.  LABORATORY DATA: Lab Results  Component Value Date   WBC 4.1 06/20/2011   HGB 12.3 06/20/2011    HCT 36.0 06/20/2011   MCV 87.6 06/20/2011   PLT 163 06/20/2011      Chemistry      Component Value Date/Time   NA 138 05/31/2011 0815   K 3.7 05/31/2011 0815   CL 103 05/31/2011 0815   CO2 27 05/31/2011 0815   BUN 7 05/31/2011 0815   CREATININE 0.88 05/31/2011 0815      Component Value Date/Time   CALCIUM 9.4 05/31/2011 0815   ALKPHOS 78 05/31/2011 0815   AST 24 05/31/2011 0815   ALT 17 05/31/2011 0815   BILITOT 0.4 05/31/2011 0815    FINAL DIAGNOSIS Diagnosis Breast, left, needle core biopsy, central - 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 - THERE IS NO EVIDENCE OF MALIGNANCY. - SEE COMMENT. Microscopic Comment The results were called to [facility] on 06/13/2011. (JK:mw 06/13/11 Pecola Leisure MD Pathologist, Electronic Signature (Case signed 06/13/2011) Specimen Gross and Clinical Information Specimen Comment Known right cancer. Linear enhancement w/ MRI central / posterior left breast Specimen(s) Obtained: Breast, left, needle core biopsy, central Specimen Clinical Information R/O DCIS Gross Received in formalin are six soft to semifirm white tan tissue cores which measure 3.0 x 0.4 x 0.4 cm up to 4.2 x 0.3 x 0.3 cm. The specimen is entirely submitted in two blocks. Block summary: A- 3 cores B- 3 cores and remaining fragments (TB:mw 06-12-11) Report signed out from the following location(s) [facility] 1 of 2 FINAL for [place], Carolyn [name] [phone]) Report signed out from the following location(s)(continued) [address],[place] [address].CLIA:[id number],CAP:[id number]., [facility] [address] [place], [place], [place] [address]. CLIA #: [id number], 2 of  FINAL DIAGNOSIS Diagnosis Breast, right, needle core biopsy, mass, 12:30 o'clock, upper - FIBROADENOMA. - THERE IS NO EVIDENCE OF MALIGNANCY. - SEE COMMENT. Microscopic Comment The results were called to [facility]  on 06/09/2011. Pecola Leisure MD Pathologist, Electronic Signature  ADDITIONAL INFORMATION: 1. PROGNOSTIC INDICATORS - ACIS Results IMMUNOHISTOCHEMICAL AND MORPHOMETRIC ANALYSIS BY THE AUTOMATED CELLULAR IMAGING SYSTEM (ACIS) Estrogen Receptor (Negative, <1%): 100%, STRONG STAINING INTENSITY Progesterone Receptor (Negative, <1%): 87%, STRONG STAINING INTENSITY Proliferation Marker Ki67 by M IB-1 (Low<20%): 14% All controls stained appropriately Pecola Leisure MD Pathologist, Electronic Signature ( Signed 06/02/2011) 1. 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 THE RATIO OF HER-2: CEP 17 SIGNALS WAS 1.41. Reference range: Ratio: HER2:CEP17 < 1.8 - gene amplification not observed Ratio: HER2:CEP 17 1.8-2.2 - equivocal result Ratio: HER2:CEP17 > 2.2 - gene amplification observed Pecola Leisure MD Pathologist, Electronic Signature ( Signed 06/01/2011) 1 of 3 FINAL for Odriscoll, Brookelynne ([id number]) FINAL DIAGNOSIS Diagnosis 1. Breast, right, needle core biopsy, 9 o'clock - HIGH GRADE DUCTAL CARCINOMA IN SITU - SEE COMMENT. 2. Lymph node, biopsy, Right axilla - POSITIVE FOR DUCTAL CARCINOMA. - SEE COMMENT. Microscopic RADIOGRAPHIC STUDIES:  Ct Chest W Contrast  06/08/2011  *RADIOLOGY REPORT*  Clinical Data:  Right breast cancer with right nodal metastasis in the axilla.  CT CHEST, ABDOMEN AND PELVIS WITH CONTRAST  Technique:  Multidetector CT imaging of the chest, abdomen and pelvis was performed following the standard protocol during bolus administration of intravenous contrast.  Contrast: OMNIPAQUE IOHEXOL 300 MG/ML  SOLN  Comparison:  PET CT scan 06/08/2011  CT CHEST  Findings:  Enhancing tissue in the right lateral breast roughly measuring 3.2 x 2.0 cm.  Small clip more inferior in the right breast.  There is a prominent 7 mm right axillary node which is not pathologic by size criteria.  No evidence of infraclavicular  enlarged lymph nodes.  No enlarged internal mammary lymph nodes. No mediastinal or hilar lymph nodes.  No pericardial fluid.  Review of the lung parenchyma demonstrates no suspicious nodules. Airways are normal.  IMPRESSION: 1.  Enhancing right breast mass. 2.  Prominent right axillary lymph node is not pathologic by size criteria. 3.  No evidence of central lymphadenopathy or mediastinal lymphadenopathy.  CT ABDOMEN AND PELVIS  Findings:  Multiple low-density lesions within the liver which are 5 mm or less and too small to characterize.  The pancreas, gallbladder, spleen, adrenal glands, kidneys are normal.  The stomach, small bowel and colon are normal.  Abdominal aorta normal caliber.  No retroperitoneal periportal lymphadenopathy no peritoneal disease.  Trace free fluid the pelvis.  Uterus is enlarged by a 5.3 x 4.5 cm leiomyoma within the posterior wall.  Leiomyoma does distort the endometrial canal but is felt to be a intramural leiomyoma.  No abnormality of the ovaries.  No pelvic lymphadenopathy. Review of  bone windows demonstrates no aggressive osseous lesions.  IMPRESSION:  1.  No evidence of metastasis within the abdomen or pelvis. 2.  Several low density lesions within the liver are too small to characterize.  Favor benign cysts.  3.  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 4.  Trace amount free fluid the pelvis likely physiologic.  Original Report Authenticated By: Genevive Bi, M.D.   US Breast Right  06/08/2011  *RADIOLOGY REPORT*  Clinical Data:  41 year old female with recently diagnosed right breast cancer laterally. Indeterminate 1.5 cm mass in the upper right breast identified on MRI - for ultrasound evaluation and sampling.  RIGHT BREAST ULTRASOUND  Comparison:  Recent MRI and mammograms.  On physical exam, a firm palpable mobile mass is identified at the 12:30 position of the right breast 6 cm from the nipple.  Findings: Ultrasound is performed, showing a 1 x  1.9 x 1.6 cm macrolobulated hypoechoic mass at the 12:30 position of the right breast 6 cm from the nipple. This mass corresponds to the MR abnormality.  IMPRESSION: 1 x 1.6 x 1.6 cm mass in the upper right breast, corresponding to the MR abnormality.  This may represent a fibroadenoma but tissue sampling is recommended given the MR features and recent diagnosis of cancer in the outer right breast.  This finding was discussed with the patient and her questions answered.  She desires to proceed with ultrasound guided right breast biopsy.  BI-RADS CATEGORY 4:  Suspicious abnormality - biopsy should be considered.  Recommend ultrasound guided right breast biopsy which will be performed today but dictated in a separate report.  Original Report Authenticated By: Rosendo Gros, M.D.   Ct Abdomen Pelvis W Contrast  06/08/2011  *RADIOLOGY REPORT*  Clinical Data:  Right breast cancer with right nodal metastasis in the axilla.  CT CHEST, ABDOMEN AND PELVIS WITH CONTRAST  Technique:  Multidetector CT imaging of the chest, abdomen and pelvis was performed following the standard protocol during bolus administration of intravenous contrast.  Contrast: OMNIPAQUE IOHEXOL 300 MG/ML  SOLN  Comparison:  PET CT scan 06/08/2011  CT CHEST  Findings:  Enhancing tissue in the right lateral breast roughly measuring 3.2 x 2.0 cm.  Small clip more inferior in the right breast.  There is a prominent 7 mm right axillary node which is not pathologic by size criteria.  No evidence of infraclavicular enlarged lymph nodes.  No enlarged internal mammary lymph nodes. No mediastinal or hilar lymph nodes.  No pericardial fluid.  Review of the lung parenchyma demonstrates no suspicious nodules. Airways are normal.  IMPRESSION: 1.  Enhancing right breast mass. 2.  Prominent right axillary lymph node is not pathologic by size criteria. 3.  No evidence of central lymphadenopathy or mediastinal lymphadenopathy.  CT ABDOMEN AND PELVIS  Findings:   Multiple low-density lesions within the liver which are 5 mm or less and too small to characterize.  The pancreas, gallbladder, spleen, adrenal glands, kidneys are normal.  The stomach, small bowel and colon are normal.  Abdominal aorta normal caliber.  No retroperitoneal periportal lymphadenopathy no peritoneal disease.  Trace free fluid the pelvis.  Uterus is enlarged by a 5.3 x 4.5 cm leiomyoma within the posterior wall.  Leiomyoma does distort the endometrial canal but is felt to be a intramural leiomyoma.  No abnormality of the ovaries.  No pelvic lymphadenopathy. Review of  bone windows demonstrates no aggressive osseous lesions.  IMPRESSION:  1.  No evidence of metastasis within the abdomen or pelvis. 2.  Several low density lesions within the liver are too small to characterize.  Favor benign cysts.  3.  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 4.  Trace amount free fluid the pelvis likely physiologic.  Original Report Authenticated By: Genevive Bi, M.D.   Mr Breast Bilateral W Wo Contrast  05/31/2011  *RADIOLOGY REPORT*  Clinical Data: 41 year old female with newly diagnosed right breast neoplasm.  BILATERAL BREAST MRI WITH AND WITHOUT CONTRAST  Technique: Multiplanar, multisequence MR images of both breasts were obtained prior to and following the intravenous administration of 12ml of Multihance.  Three dimensional images were evaluated at the independent DynaCad workstation.  Comparison:  Recent and prior mammograms and 05/08/2011 ultrasound.  Findings: Moderate normal background parenchymal enhancement is identified.  A 5 x 3 x 4.5 cm confluent enhancing mass (rapid washout kinetics) within the outer right breast, middle and posterior thirds, contains biopsy clip artifact and compatible with biopsy-proven neoplasm. This neoplasm abuts the chest wall musculature.  A 1.5 x 1.7 x 1.5 cm irregular enhancing mass (plateau kinetics) is identified in the upper inner right  breast, posterior third, and suspicious for neoplasm.  This mass lies approximately 7 cm superior and medial to the biopsy-proven neoplasm in the outer right breast.  A 2 cm linear area of stippled enhancement (plateau kinetics) is identified within the deep central left breast - suspicious.  No enlarged or abnormal appearing lymph nodes are noted. The biopsy- proven positive right axillary lymph node is difficult to visualize on this study. No bony or upper hepatic abnormalities are identified.  IMPRESSION: 5 x 3 x 4.5 cm biopsy-proven neoplasm in the posterior outer right breast abutting the chest wall musculature.  1.5 x 1.7 x 1.5 cm irregular enhancing mass in the posterior upper inner right breast - highly suspicious for neoplasm.  Second look ultrasound and biopsy is recommended.  2 cm suspicion linear stippled enhancement within the posterior central left breast - this is unlikely to be visualized sonographically and MR guided biopsy is recommended.  No enlarged or abnormal-appearing lymph nodes identified.  The biopsy-proven positive right axillary lymph node is difficult to visualize on this study.  THREE-DIMENSIONAL MR IMAGE RENDERING ON INDEPENDENT WORKSTATION:  Three-dimensional MR images were rendered by post-processing of the original MR data on an independent workstation.  The three- dimensional MR images were interpreted, and findings were reported in the accompanying complete MRI report for this study.  BI-RADS CATEGORY 5:  Highly suggestive of malignancy - appropriate action should be taken.  Recommend second look right breast ultrasound and biopsy of the upper inner right breast mass.  Recommend MR guided biopsy of the linear stippled enhancement in the posterior central left breast.  Original Report Authenticated By: Rosendo Gros, M.D.   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 Histology  correlates with imaging findings.  The patient was contacted by phone on 06/14/2011 and these results given to her which she understood. Her questions were answered. The patient had no complaints with her biopsy site.  Recommend treatment plan for known right breast neoplasm.   Addended by:  Elpidio Eric. Si Gaul, M.D. on 06/14/2011 14:03:33.  **END ADDENDUM** SIGNED BY: Tinnie Gens T. Si Gaul, M.D.   06/12/2011  *RADIOLOGY REPORT*  Clinical Data:  Known right breast cancer.  Abnormal enhancement seen in the left breast.  MRI GUIDED VACUUM ASSISTED BIOPSY OF THE left BREAST WITHOUT AND WITH CONTRAST  Technique: Multiplanar, multisequence MR images of the left breast were obtained prior to and following the intravenous administration of 12 ml of Mulithance.  I met with the patient, and we discussed the procedure of MRI guided biopsy, including risks, benefits, and alternatives. Specifically, we discussed the risks of infection, bleeding, tissue injury, clip migration, and inadequate sampling.  Informed, written consent was given.  Using sterile technique, 2% Lidocaine, MRI guidance, and a 9 gauge vacuum assisted device, biopsy was performed of the far posterior aspect of the left breast.  At the conclusion of the procedure, a tissue marker clip was deployed into the biopsy cavity.  IMPRESSION: MRI guided biopsy of the left breast. No apparent complications.  THREE-DIMENSIONAL MR IMAGE RENDERING ON INDEPENDENT WORKSTATION:  Three-dimensional MR images were rendered by post-processing of the original MR data on an independent workstation.  The three- dimensional MR images were interpreted, and findings were reported in the accompanying complete MRI report for this study.  Original Report Authenticated By: Rosendo Gros, M.D.   Nm Pet Image Initial (pi) Skull Base To Thigh  06/08/2011  *RADIOLOGY REPORT*  Clinical Data: Initial treatment strategy for breast cancer.  17- year female with right breast cancer.  Biopsy of right axillary  lymph node demonstrate high-grade invasive ductal carcinoma.  NUCLEAR MEDICINE PET SKULL BASE TO THIGH  Fasting Blood Glucose:  101  Technique:  16.1 mCi F-18 FDG was injected intravenously.   CT data was obtained and used for attenuation correction and anatomic localization only.  (This was not acquired as a diagnostic CT examination.) Additional exam technical data entered  on technologist worksheet.  Comparison: CT scan 05/30/ 2013, MRI breast 05/18/2010  Findings:  Neck: No hypermetabolic nodes in the neck.  Chest: In the right lateral breast,  there is intensely hypermetabolic lesion with SUV max = 7.4.  This corresponds to enhancing tissue on comparison CT and MRI. Hypermetabolic lymph node in the high right axilla (image 72) with SUV max = 2.2.  This corresponds to a prominent 6 mm nodule on comparison CT.  No hypermetabolic sub pectoralis, infraclavicular,  or internal mammary lymph nodes.  No hypermetabolic mediastinal lymph nodes. No suspicious pulmonary nodules appear  Abdomen / Pelvis:No abnormal hypermetabolic activity within the solid organs.  No evidence of abdominal or pelvic hypermetabolic nodes.  Skeleton:No focal hypermetabolic activity to suggest skeletal metastasis.  IMPRESSION:  1.  Hypermetabolic mass within the right lateral breast consistent with primary carcinoma. 2.  Mild hypermetabolic activity with associated with a prominent right axillary lymph node is suspicious for local nodal metastasis. 3.  No evidence of more central nodal metastasis or mediastinal metastasis. 4.  No evidence of systemic disease.  Original Report Authenticated By: Genevive Bi, M.D.   [place] Core Biopsy  06/09/2011  **ADDENDUM** CREATED: 06/09/2011 16:56:58  The pathology revealed fibroadenoma.  This is found to be concordant by Dr. Si Gaul.  I spoke with the patient over the phone. The patient states she is doing well post biopsy without complications.  Recommend continued with known course of treatment as patient has  recently diagnosed known right breast cancer elsewhere right breast.  **END ADDENDUM** SIGNED BY: Sherian Rein, M.D.   06/09/2011  *RADIOLOGY REPORT*  Clinical Data:  41 year old female with indeterminate mass in the upper right breast - for tissue sampling.  ULTRASOUND GUIDED CORE BIOPSY OF THE RIGHT BREAST  Comparison: Recent mammograms, ultrasound and MRI.  I met with the patient and we discussed the procedure of ultrasound- guided biopsy, including benefits and alternatives.  We discussed the high likelihood of a successful procedure. We discussed the risks of the procedure, including infection, bleeding, tissue injury, clip migration, and inadequate sampling.  Informed written consent was given.  Using sterile technique 2% lidocaine, ultrasound guidance and a 14 gauge automated biopsy device, biopsy was performed of 1 x 1.9 x 1.6 cm macrolobulated hypoechoic mass at the 12:30 position of the right breast 6 cm from the nipple.  At the conclusion of the procedure a coil shaped tissue marker clip was deployed into the biopsy cavity.  Follow up 2 view mammogram was performed and dictated separately.  IMPRESSION: Ultrasound guided biopsy of upper right breast mass.  No apparent complications.  Final pathology demonstrates BENIGN FIBROADENOMA. Histology correlates with imaging findings.  Multiple attempts to contact the patient were unsuccessful as the phone numbers she provided were either wrong or disconnected. We will discuss the results of this biopsy with the patient on 06/12/2011, when she has a scheduled MR guided left breast biopsy.  Recommend continued treatment plan for known outer right breast neoplasm.  Original Report Authenticated By: Sherian Rein, M.D.   Dg Chest Port 1 View  06/20/2011  *RADIOLOGY REPORT*  Clinical Data: Status post Port-A-Cath placement.  PORTABLE CHEST - 1 VIEW  Comparison: CT chest 06/08/2011.  Findings: The patient has a new left subclavian approach Port-A- Cath.  Tip of the  catheter is in the lower superior vena cava. There is no pneumothorax.  Lungs appear clear.  Heart size is normal.  No focal bony abnormality.  IMPRESSION: Port-A-Cath in place without evidence of complication.  No acute finding.  Original Report Authenticated By: Bernadene Bell. Maricela Curet, M.D.   Mm Digital Diagnostic Unilat L  06/12/2011  *RADIOLOGY REPORT*  Clinical Data:  Known right breast cancer.  Status post MR guided core biopsy of the left breast for abnormal enhancement.  DIGITAL DIAGNOSTIC LEFT MAMMOGRAM  Comparison:  With priors  Findings:  Films are performed following MR guided biopsy of the left breast.  Mammographic images demonstrate there is a bullet shaped clip in the  far posterior aspect of the left breast.  IMPRESSION: Status post MR guided core biopsy of the left breast with pathology pending.  Original Report Authenticated By: Littie Deeds. Judyann Munson, M.D.   Mm Digital Diagnostic Unilat R  06/08/2011  *RADIOLOGY REPORT*  Clinical Data:  Evaluate clip placement following ultrasound guided right breast biopsy.  DIGITAL DIAGNOSTIC RIGHT MAMMOGRAM  Comparison:  [facility]  Findings:  Films are performed following ultrasound guided biopsy of 1 x 1.9 x 1.6 cm mass at the 12:30 position of the right breast 6 cm from the nipple.  The coil shaped clip is in satisfactory position.  IMPRESSION: Satisfactory clip position following ultrasound guided right breast biopsy.  Pathology will be followed.  Original Report Authenticated By: Rosendo Gros, M.D.   Dg Fluoro Guide Cv Line-no Report  06/20/2011  CLINICAL DATA: port a cath   FLOURO GUIDE CV LINE  Fluoroscopy was utilized by the requesting physician.  No radiographic  interpretation.      ASSESSMENT: 41 year old female with  #1 stage IIB invasive ductal carcinoma of the right breast. Patient presented with calcifications in the right breast with right axillary lymph node that was positive for invasive ductal carcinoma. The area of concern in the right  buttocks the underlying muscle and therefore patient is being offered neoadjuvant chemotherapy. Patient and I have discussed treatment with Taxotere and Cytoxan. Plan is to get give her about 4-6 cycles of treatment.  #2 once patient completes her treatment she will then go on to get her surgery performed. Because tumor is ER positive she will eventually need antiestrogen therapy. There will be tamoxifen.  #3 patient has had genetic counseling and testing performed and results of which are pending at this time.   PLAN:    #1 patient will proceed with her Taxotere and Cytoxan beginning on June 27 2011. Risks and benefits of treatment have been discussed with her in detail. She has had her chemotherapy teaching class as well as a Port-A-Cath placed.patient understands the rationale for neoadjuvant treatment.  #2 patient will need anti-emetics and these will be ordered as well as EMLA cream.  #3 once patient has completed her chemotherapy she will go on to have surgery. She most certainly will need radiation therapy as well.   All questions were answered. The patient knows to call the clinic with any problems, questions or concerns. We can certainly see the patient much sooner if necessary.  I spent 30 minutes counseling the patient face to face. The total time spent in the appointment was 30 minutes.    Drue Second, MD Medical/Oncology [facility] [phone] (beeper) [phone] (Office)  06/23/2011, 4:31 PM

## 2011-06-26 ENCOUNTER — Telehealth: Payer: Self-pay | Admitting: *Deleted

## 2011-06-26 MED ORDER — PROCHLORPERAZINE MALEATE 10 MG PO TABS: 10.0000 mg | ORAL_TABLET | Freq: Four times a day (QID) | ORAL | Status: DC | PRN

## 2011-06-26 MED ORDER — LIDOCAINE-PRILOCAINE 2.5-2.5 % EX CREA: TOPICAL_CREAM | CUTANEOUS | Status: DC | PRN

## 2011-06-26 MED ORDER — LORAZEPAM 0.5 MG PO TABS
0.5000 mg | ORAL_TABLET | Freq: Four times a day (QID) | ORAL | Status: AC | PRN
Start: 2011-06-26 — End: 2011-07-06

## 2011-06-26 MED ORDER — ONDANSETRON HCL 8 MG PO TABS: 8.0000 mg | ORAL_TABLET | Freq: Two times a day (BID) | ORAL | Status: AC | PRN

## 2011-06-26 NOTE — Telephone Encounter (Signed)
Pt called Carolyn [name] states "I was suppose to have some medicine called in on Friday to [facility] on [address] and I'm supposed to be there tomorrow for treatment but I don't know what time." Pt last seen by MD 6/14.  Review with MDwho advised Pt last Lab 6/11 do not repeat. Pt to be treated with Taxotere/Cytoxan 6/18. Spoke with Marcelino Duster in Infusion -appt available for Taxol/Carbo 1st time at 0930. Medication sent into pt's pharmacy. Pt called and notified of appt 6/18 @ 0930 Onc tx sent

## 2011-06-27 ENCOUNTER — Ambulatory Visit (HOSPITAL_BASED_OUTPATIENT_CLINIC_OR_DEPARTMENT_OTHER): Payer: BC Managed Care – PPO

## 2011-06-27 ENCOUNTER — Telehealth: Payer: Self-pay | Admitting: Oncology

## 2011-06-27 ENCOUNTER — Other Ambulatory Visit: Payer: Self-pay | Admitting: *Deleted

## 2011-06-27 VITALS — BP 110/63 | HR 69 | Temp 97.4°F

## 2011-06-27 DIAGNOSIS — C50419 Malignant neoplasm of upper-outer quadrant of unspecified female breast: Secondary | ICD-10-CM

## 2011-06-27 DIAGNOSIS — C773 Secondary and unspecified malignant neoplasm of axilla and upper limb lymph nodes: Secondary | ICD-10-CM

## 2011-06-27 DIAGNOSIS — D0590 Unspecified type of carcinoma in situ of unspecified breast: Secondary | ICD-10-CM

## 2011-06-27 DIAGNOSIS — Z5111 Encounter for antineoplastic chemotherapy: Secondary | ICD-10-CM

## 2011-06-27 MED ORDER — ONDANSETRON 16 MG/50ML IVPB (CHCC)
16.0000 mg | Freq: Once | INTRAVENOUS | Status: AC
  Administered 2011-06-27: 16 mg via INTRAVENOUS

## 2011-06-27 MED ORDER — DEXAMETHASONE 4 MG PO TABS: 8.0000 mg | ORAL_TABLET | ORAL | Status: DC

## 2011-06-27 MED ORDER — HEPARIN SOD (PORK) LOCK FLUSH 100 UNIT/ML IV SOLN
500.0000 [IU] | Freq: Once | INTRAVENOUS | Status: AC | PRN
  Administered 2011-06-27: 500 [IU]
  Filled 2011-06-27: qty 5

## 2011-06-27 MED ORDER — SODIUM CHLORIDE 0.9 % IJ SOLN
10.0000 mL | INTRAMUSCULAR | Status: DC | PRN
  Administered 2011-06-27: 10 mL
  Filled 2011-06-27: qty 10

## 2011-06-27 MED ORDER — DOCETAXEL CHEMO INJECTION 160 MG/16ML
75.0000 mg/m2 | Freq: Once | INTRAVENOUS | Status: AC
  Administered 2011-06-27: 120 mg via INTRAVENOUS
  Filled 2011-06-27: qty 12

## 2011-06-27 MED ORDER — DEXAMETHASONE SODIUM PHOSPHATE 4 MG/ML IJ SOLN
20.0000 mg | Freq: Once | INTRAMUSCULAR | Status: AC
  Administered 2011-06-27: 20 mg via INTRAVENOUS

## 2011-06-27 MED ORDER — SODIUM CHLORIDE 0.9 % IV SOLN
600.0000 mg/m2 | Freq: Once | INTRAVENOUS | Status: AC
  Administered 2011-06-27: 1000 mg via INTRAVENOUS
  Filled 2011-06-27: qty 50

## 2011-06-27 MED ORDER — DEXAMETHASONE 4 MG PO TABS: 8.0000 mg | ORAL_TABLET | ORAL | Status: AC

## 2011-06-27 MED ORDER — SODIUM CHLORIDE 0.9 % IV SOLN
Freq: Once | INTRAVENOUS | Status: AC
  Administered 2011-06-27: 10:00:00 via INTRAVENOUS

## 2011-06-27 NOTE — Telephone Encounter (Signed)
S/w Carolyn [name] and she will gve the pt her June,july and aug 2013 appt calendar

## 2011-06-27 NOTE — Telephone Encounter (Signed)
Per staff message I have scheduled appts. JMW  

## 2011-06-27 NOTE — Patient Instructions (Signed)
[facility] Discharge Instructions for Patients Receiving Chemotherapy  Today you received the following chemotherapy agents Taxotere and Cytoxan  To help prevent nausea and vomiting after your treatment, we encourage you to take your nausea medication tonight 30 minutes prior to evening meal, tomorrow 30 minutes prior to breakfast and lunch, then as prescribed.   If you develop nausea and vomiting that is not controlled by your nausea medication, call the clinic. If it is after clinic hours your family physician or the after hours number for the clinic or go to the Emergency Department.   BELOW ARE SYMPTOMS THAT SHOULD BE REPORTED IMMEDIATELY:  *FEVER GREATER THAN 100.5 F  *CHILLS WITH OR WITHOUT FEVER  NAUSEA AND VOMITING THAT IS NOT CONTROLLED WITH YOUR NAUSEA MEDICATION  *UNUSUAL SHORTNESS OF BREATH  *UNUSUAL BRUISING OR BLEEDING  TENDERNESS IN MOUTH AND THROAT WITH OR WITHOUT PRESENCE OF ULCERS  *URINARY PROBLEMS  *BOWEL PROBLEMS  UNUSUAL RASH Items with * indicate a potential emergency and should be followed up as soon as possible.  One of the nurses will contact you 24 hours after your treatment. Please let the nurse know about any problems that you may have experienced. Feel free to call the clinic you have any questions or concerns. The clinic phone number is [phone].   I have been informed and understand all the instructions given to me. I know to contact the clinic, my physician, or go to the Emergency Department if any problems should occur. I do not have any questions at this time, but understand that I may call the clinic during office hours   should I have any questions or need assistance in obtaining follow up care.    __________________________________________  _____________  __________ Signature of Patient or Authorized Representative            Date                   Time    __________________________________________ Nurse's  Signature  Docetaxel (Taxotere) injection What is this medicine? DOCETAXEL (doe se TAX el) is a chemotherapy drug. It targets fast dividing cells, like cancer cells, and causes these cells to die. This medicine is used to treat many types of cancers like breast cancer, certain stomach cancers, head and neck cancer, lung cancer, and prostate cancer. This medicine may be used for other purposes; ask your health care provider or pharmacist if you have questions.  What should I tell my health care provider before I take this medicine? They need to know if you have any of these conditions: -infection (especially a virus infection such as chickenpox, cold sores, or herpes) -liver disease -low blood counts, like low white cell, platelet, or red cell counts -an unusual or allergic reaction to docetaxel, polysorbate 80, other chemotherapy agents, other medicines, foods, dyes, or preservatives -pregnant or trying to get pregnant -breast-feeding  How should I use this medicine? This drug is given as an infusion into a vein. It is administered in a hospital or clinic by a specially trained health care professional. Talk to your pediatrician regarding the use of this medicine in children. Special care may be needed. Overdosage: If you think you have taken too much of this medicine contact a poison control center or emergency room at once. NOTE: This medicine is only for you. Do not share this medicine with others. What if I miss a dose? It is important not to miss your dose. Call your doctor or health  care professional if you are unable to keep an appointment.  What may interact with this medicine? -cyclosporine -erythromycin -ketoconazole -medicines to increase blood counts like filgrastim, pegfilgrastim, sargramostim -vaccines Talk to your doctor or health care professional before taking any of these medicines: -acetaminophen -aspirin -ibuprofen -ketoprofen -naproxen This list may not  describe all possible interactions. Give your health care provider a list of all the medicines, herbs, non-prescription drugs, or dietary supplements you use. Also tell them if you smoke, drink alcohol, or use illegal drugs. Some items may interact with your medicine.  What should I watch for while using this medicine? Your condition will be monitored carefully while you are receiving this medicine. You will need important blood work done while you are taking this medicine. This drug may make you feel generally unwell. This is not uncommon, as chemotherapy can affect healthy cells as well as cancer cells. Report any side effects. Continue your course of treatment even though you feel ill unless your doctor tells you to stop. In some cases, you may be given additional medicines to help with side effects. Follow all directions for their use. Call your doctor or health care professional for advice if you get a fever, chills or sore throat, or other symptoms of a cold or flu. Do not treat yourself. This drug decreases your body's ability to fight infections. Try to avoid being around people who are sick. This medicine may increase your risk to bruise or bleed. Call your doctor or health care professional if you notice any unusual bleeding. Be careful brushing and flossing your teeth or using a toothpick because you may get an infection or bleed more easily. If you have any dental work done, tell your dentist you are receiving this medicine. Avoid taking products that contain aspirin, acetaminophen, ibuprofen, naproxen, or ketoprofen unless instructed by your doctor. These medicines may hide a fever. Do not become pregnant while taking this medicine. Women should inform their doctor if they wish to become pregnant or think they might be pregnant. There is a potential for serious side effects to an unborn child. Talk to your health care professional or pharmacist for more information. Do not breast-feed an  infant while taking this medicine. What side effects may I notice from receiving this medicine?  Side effects that you should report to your doctor or health care professional as soon as possible: -allergic reactions like skin rash, itching or hives, swelling of the face, lips, or tongue -low blood counts - This drug may decrease the number of white blood cells, red blood cells and platelets. You may be at increased risk for infections and bleeding. -signs of infection - fever or chills, cough, sore throat, pain or difficulty passing urine -signs of decreased platelets or bleeding - bruising, pinpoint red spots on the skin, black, tarry stools, nosebleeds -signs of decreased red blood cells - unusually weak or tired, fainting spells, lightheadedness -breathing problems -fast or irregular heartbeat -low blood pressure -mouth sores -nausea and vomiting -pain, swelling, redness or irritation at the injection site -pain, tingling, numbness in the hands or feet -swelling of the ankle, feet, hands -weight gain Side effects that usually do not require medical attention (report to your prescriber or health care professional if they continue or are bothersome): -bone pain -complete hair loss including hair on your head, underarms, pubic hair, eyebrows, and eyelashes -diarrhea -excessive tearing -changes in the color of fingernails -loosening of the fingernails -nausea -muscle pain -red flush to skin -sweating -  weak or tired This list may not describe all possible side effects. Call your doctor for medical advice about side effects. You may report side effects to FDA at 1-800-FDA-1088. Where should I keep my medicine? This drug is given in a hospital or clinic and will not be stored at home. NOTE: This sheet is a summary. It may not cover all possible information. If you have questions about this medicine, talk to your doctor, pharmacist, or health care provider.  2012, [employer]  Standard. (12/09/2007 11:52:10 AM)  Cyclophosphamide (Cytoxan) injection What is this medicine? CYCLOPHOSPHAMIDE (sye kloe FOSS fa mide) is a chemotherapy drug. It slows the growth of cancer cells. This medicine is used to treat many types of cancer like lymphoma, myeloma, leukemia, breast cancer, and ovarian cancer, to name a few. It is also used to treat nephrotic syndrome in children. This medicine may be used for other purposes; ask your health care provider or pharmacist if you have questions.  What should I tell my health care provider before I take this medicine? They need to know if you have any of these conditions: -blood disorders -history of other chemotherapy -history of radiation therapy -infection -kidney disease -liver disease -tumors in the bone marrow -an unusual or allergic reaction to cyclophosphamide, other chemotherapy, other medicines, foods, dyes, or preservatives -pregnant or trying to get pregnant -breast-feeding  How should I use this medicine? This drug is usually given as an injection into a vein or muscle or by infusion into a vein. It is administered in a hospital or clinic by a specially trained health care professional. Talk to your pediatrician regarding the use of this medicine in children. While this drug may be prescribed for selected conditions, precautions do apply. Overdosage: If you think you have taken too much of this medicine contact a poison control center or emergency room at once. NOTE: This medicine is only for you. Do not share this medicine with others. What if I miss a dose? It is important not to miss your dose. Call your doctor or health care professional if you are unable to keep an appointment.  What may interact with this medicine? Do not take this medicine with any of the following medications: -mibefradil -nalidixic acid This medicine may also interact with the following medications: -doxorubicin -etanercept -medicines to  increase blood counts like filgrastim, pegfilgrastim, sargramostim -medicines that block muscle or nerve pain -St. John's Wort -phenobarbital -succinylcholine chloride -trastuzumab -vaccines Talk to your doctor or health care professional before taking any of these medicines: -acetaminophen -aspirin -ibuprofen -ketoprofen -naproxen This list may not describe all possible interactions. Give your health care provider a list of all the medicines, herbs, non-prescription drugs, or dietary supplements you use. Also tell them if you smoke, drink alcohol, or use illegal drugs. Some items may interact with your medicine.  What should I watch for while using this medicine? Visit your doctor for checks on your progress. This drug may make you feel generally unwell. This is not uncommon, as chemotherapy can affect healthy cells as well as cancer cells. Report any side effects. Continue your course of treatment even though you feel ill unless your doctor tells you to stop. Drink water or other fluids as directed. Urinate often, even at night. In some cases, you may be given additional medicines to help with side effects. Follow all directions for their use. Call your doctor or health care professional for advice if you get a fever, chills or sore throat, or other symptoms  of a cold or flu. Do not treat yourself. This drug decreases your body's ability to fight infections. Try to avoid being around people who are sick. This medicine may increase your risk to bruise or bleed. Call your doctor or health care professional if you notice any unusual bleeding. Be careful brushing and flossing your teeth or using a toothpick because you may get an infection or bleed more easily. If you have any dental work done, tell your dentist you are receiving this medicine. Avoid taking products that contain aspirin, acetaminophen, ibuprofen, naproxen, or ketoprofen unless instructed by your doctor. These medicines may hide a  fever. Do not become pregnant while taking this medicine. Women should inform their doctor if they wish to become pregnant or think they might be pregnant. There is a potential for serious side effects to an unborn child. Talk to your health care professional or pharmacist for more information. Do not breast-feed an infant while taking this medicine. Men should inform their doctor if they wish to father a child. This medicine may lower sperm counts. If you are going to have surgery, tell your doctor or health care professional that you have taken this medicine.  What side effects may I notice from receiving this medicine? Side effects that you should report to your doctor or health care professional as soon as possible: -allergic reactions like skin rash, itching or hives, swelling of the face, lips, or tongue -low blood counts - this medicine may decrease the number of white blood cells, red blood cells and platelets. You may be at increased risk for infections and bleeding. -signs of infection - fever or chills, cough, sore throat, pain or difficulty passing urine -signs of decreased platelets or bleeding - bruising, pinpoint red spots on the skin, black, tarry stools, blood in the urine -signs of decreased red blood cells - unusually weak or tired, fainting spells, lightheadedness -breathing problems -dark urine -mouth sores -pain, swelling, redness at site where injected -swelling of the ankles, feet, hands -trouble passing urine or change in the amount of urine -weight gain -yellowing of the eyes or skin Side effects that usually do not require medical attention (report to your doctor or health care professional if they continue or are bothersome): -changes in nail or skin color -diarrhea -hair loss -loss of appetite -missed menstrual periods -nausea, vomiting -stomach pain This list may not describe all possible side effects. Call your doctor for medical advice about side effects.  You may report side effects to FDA at 1-800-FDA-1088. Where should I keep my medicine? This drug is given in a hospital or clinic and will not be stored at home. NOTE: This sheet is a summary. It may not cover all possible information. If you have questions about this medicine, talk to your doctor, pharmacist, or health care provider.  2012, [employer] Standard. (04/02/2007 2:32:25 PM)

## 2011-06-28 ENCOUNTER — Ambulatory Visit (HOSPITAL_BASED_OUTPATIENT_CLINIC_OR_DEPARTMENT_OTHER): Payer: BC Managed Care – PPO

## 2011-06-28 ENCOUNTER — Telehealth: Payer: Self-pay | Admitting: *Deleted

## 2011-06-28 VITALS — BP 102/67 | HR 76 | Temp 97.2°F

## 2011-06-28 DIAGNOSIS — C50419 Malignant neoplasm of upper-outer quadrant of unspecified female breast: Secondary | ICD-10-CM

## 2011-06-28 DIAGNOSIS — Z5189 Encounter for other specified aftercare: Secondary | ICD-10-CM

## 2011-06-28 DIAGNOSIS — D0590 Unspecified type of carcinoma in situ of unspecified breast: Secondary | ICD-10-CM

## 2011-06-28 DIAGNOSIS — C773 Secondary and unspecified malignant neoplasm of axilla and upper limb lymph nodes: Secondary | ICD-10-CM

## 2011-06-28 MED ORDER — PEGFILGRASTIM INJECTION 6 MG/0.6ML
6.0000 mg | Freq: Once | SUBCUTANEOUS | Status: AC
  Administered 2011-06-28: 6 mg via SUBCUTANEOUS
  Filled 2011-06-28: qty 0.6

## 2011-06-28 NOTE — Telephone Encounter (Signed)
Carolyn [name] here for Neulasta injection.  Had her 1st chemotherapy treatment of Cytoxan/Taxotere yesterday.   States that she is not having any problems.  No nausea, vomiting, or diarrhea.  Drinking and eating as instructed.  She is aware of need to call if she has any problems, questions, or concerns.

## 2011-07-03 ENCOUNTER — Telehealth: Payer: Self-pay | Admitting: Genetic Counselor

## 2011-07-03 NOTE — Telephone Encounter (Signed)
Started to reveal negative BRCA and BART results but call was dropped.

## 2011-07-03 NOTE — Telephone Encounter (Signed)
Left message that I had test results and to please call back.  Left phone number.

## 2011-07-04 ENCOUNTER — Ambulatory Visit (HOSPITAL_BASED_OUTPATIENT_CLINIC_OR_DEPARTMENT_OTHER): Payer: BC Managed Care – PPO | Admitting: Oncology

## 2011-07-04 ENCOUNTER — Encounter: Payer: Self-pay | Admitting: Oncology

## 2011-07-04 ENCOUNTER — Other Ambulatory Visit (HOSPITAL_BASED_OUTPATIENT_CLINIC_OR_DEPARTMENT_OTHER): Payer: BC Managed Care – PPO | Admitting: Lab

## 2011-07-04 ENCOUNTER — Encounter: Payer: Self-pay | Admitting: Genetic Counselor

## 2011-07-04 VITALS — BP 105/70 | HR 88 | Temp 98.4°F | Ht 68.5 in | Wt 124.9 lb

## 2011-07-04 DIAGNOSIS — C773 Secondary and unspecified malignant neoplasm of axilla and upper limb lymph nodes: Secondary | ICD-10-CM

## 2011-07-04 DIAGNOSIS — Z170 Estrogen receptor positive status [ER+]: Secondary | ICD-10-CM

## 2011-07-04 DIAGNOSIS — C50419 Malignant neoplasm of upper-outer quadrant of unspecified female breast: Secondary | ICD-10-CM

## 2011-07-04 LAB — CBC WITH DIFFERENTIAL/PLATELET
Basophils Absolute: 0 10*3/uL (ref 0.0–0.1)
Eosinophils Absolute: 0 10*3/uL (ref 0.0–0.5)
HGB: 11.6 g/dL (ref 11.6–15.9)
NEUT#: 4.5 10*3/uL (ref 1.5–6.5)
RDW: 12.2 % (ref 11.2–14.5)
WBC: 7 10*3/uL (ref 3.9–10.3)
lymph#: 1.2 10*3/uL (ref 0.9–3.3)

## 2011-07-04 LAB — COMPREHENSIVE METABOLIC PANEL
AST: 24 U/L (ref 0–37)
Albumin: 4.3 g/dL (ref 3.5–5.2)
BUN: 12 mg/dL (ref 6–23)
Calcium: 9.4 mg/dL (ref 8.4–10.5)
Chloride: 103 mEq/L (ref 96–112)
Glucose, Bld: 69 mg/dL — ABNORMAL LOW (ref 70–99)
Potassium: 4.3 mEq/L (ref 3.5–5.3)
Total Protein: 6.8 g/dL (ref 6.0–8.3)

## 2011-07-04 NOTE — Progress Notes (Signed)
OFFICE PROGRESS NOTE  CC  Carolyn [name],[name], MD [address] [place] [address] Dr. Cyndia Bent Dr. 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 She had an ultrasound that revealed an area ofconcern measuring 2.9 cm. She also was found to have a right axillary lymph node that was suspicious for malignancy. Patient went on to have biopsy of the calcifications that showed high-grade ductal carcinoma in situ. Biopsy of the right axillary lymph node revealed an invasive ductal carcinoma. The tumor was ER positive PR positive HER-2/neu negative with a Ki-67 of 14%.    #2 on MRI patient was also found to have other areas of concern in the contralateral breast. Also was noted that in the right breast the tumor abutted the chest wall.since then patient has gone on to have biopsies performed at the other areas that are negative for a malignancy. The pathology  is shown below.  #3 patient was begun on neoadjuvant chemotherapy consisting of Taxotere and Cytoxan on 06/27/2011. A total of 4-6 cycles is planned.  CURRENT THERAPY:Post cycle 1 of Tc with day 2 Neulasta. She is here for interim labs.  INTERVAL HISTORY: Carolyn [name] 41 y.o. female returns for followup visit Having had cycle 1 of chemotherapy. Overall she's tolerated it well. Her blood counts looked terrific. She did have some aches and pains due to the Neulasta. She also developed a tiny bit of a fever after the Neulasta as well. Today she is without any complaints she is drinking well she is well-hydrated. She denies any  fevers chills night sweats headaches shortness of breath chest pains palpitations no myalgias or arthralgias no bleeding problems. She does tell me that she did have her menstrual cycle couple of days after her chemotherapy. Her flow was normal. I have recommended that if her flow becomes very heavy she is to tell me so that we can do something to stop her periods for the duration of the chemotherapy such as Zoladex. Review of the 10 point review of systems is negative. MEDICAL HISTORY: Past Medical History  Diagnosis Date  . Breast cancer     ALLERGIES:  is allergic to allegra and shellfish allergy.  MEDICATIONS:  Current Outpatient Prescriptions  Medication Sig Dispense Refill  . Cholecalciferol (VITAMIN D3) 2000 UNITS TABS Take 2,000 Units by mouth daily.      Marland Kitchen dexamethasone (DECADRON) 4 MG tablet Take 2 tablets (8 mg total) by mouth as directed. Take 2 tablets (8mg ) twice daily starting the day before , the day of chemo, the day after chemo for 3 days then stop.  30 tablet  3  . lidocaine-prilocaine (EMLA) cream Apply topically as needed. Apply to port area 45 mins prior to treatment.  30 g  5  . lidocaine-prilocaine (EMLA) cream Apply topically as needed.  30 g  6  . Loratadine 10 MG CAPS Take 10 mg by mouth daily.      Marland Kitchen LORazepam (ATIVAN) 0.5 MG tablet Take 1 tablet (0.5 mg total) by mouth every 6 (six) hours as needed for anxiety.  30 tablet  2  . Multiple Vitamin (MULTIVITAMIN) tablet Take 1  tablet by mouth daily.      . ondansetron (ZOFRAN) 8 MG tablet Take 1 tablet (8 mg total) by mouth every 12 (twelve) hours as needed for nausea.  20 tablet  2  . prochlorperazine (COMPAZINE) 10 MG tablet Take 1 tablet (10 mg total) by mouth every 6 (six) hours as needed.  30 tablet  2    SURGICAL HISTORY:  Past Surgical History  Procedure Date  . Wisdom tooth extraction   . Breast biopsy     Left  . Portacath placement 06/20/2011    Procedure: INSERTION PORT-A-CATH;  Surgeon: [name], MD;  Location: [facility] OR;  Service: General;  Laterality: N/A;    REVIEW OF SYSTEMS:  Pertinent items are noted in HPI.   PHYSICAL EXAMINATION: General appearance: alert, cooperative and appears stated age Lymph nodes: Cervical, supraclavicular, and axillary nodes normal. Resp: clear to auscultation bilaterally and normal percussion bilaterally Back: symmetric, no curvature. ROM normal. No CVA tenderness. Cardio: regular rate and rhythm, S1, S2 normal, no murmur, click, rub or gallop GI: soft, non-tender; bowel sounds normal; no masses,  no organomegaly Extremities: extremities normal, atraumatic, no cyanosis or edema Neurologic: Grossly normal Breast examination right breast reveals a palpable mass left breast no masses but there is nodularity noted right axillary lymph node is palpable exam ECOG PERFORMANCE STATUS: 0 - Asymptomatic  Blood pressure 105/70, pulse 88, temperature 98.4 F (36.9 C), temperature source Oral, height 5' 8.5" (1.74 m), weight 124 lb 14.4 oz (56.654 kg), last menstrual period 06/03/2011.  LABORATORY DATA: Lab Results  Component Value Date   WBC 7.0 07/04/2011   HGB 11.6 07/04/2011   HCT 34.3* 07/04/2011   MCV 87.9 07/04/2011   PLT 133* 07/04/2011      Chemistry      Component Value Date/Time   NA 138 05/31/2011 0815   K 3.7 05/31/2011 0815   CL 103 05/31/2011 0815   CO2 27 05/31/2011 0815   BUN 7 05/31/2011 0815   CREATININE 0.88 05/31/2011 0815      Component Value Date/Time   CALCIUM 9.4 05/31/2011 0815   ALKPHOS 78 05/31/2011 0815   AST 24 05/31/2011 0815   ALT 17 05/31/2011 0815   BILITOT 0.4 05/31/2011 0815    FINAL DIAGNOSIS Diagnosis Breast, left, needle core biopsy, central - 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 - THERE IS NO EVIDENCE OF MALIGNANCY. - SEE COMMENT. Microscopic Comment The results were called to [facility] on 06/13/2011. (JK:mw 06/13/11 Pecola Leisure MD Pathologist, Electronic  Signature (Case signed 06/13/2011) Specimen Gross and Clinical Information Specimen Comment Known right cancer. Linear enhancement w/ MRI central / posterior left breast Specimen(s) Obtained: Breast, left, needle core biopsy, central Specimen Clinical Information R/O DCIS Gross Received in formalin are six soft to semifirm white tan tissue cores which measure 3.0 x 0.4 x 0.4 cm up to 4.2 x 0.3 x 0.3 cm. The specimen is entirely submitted in two blocks. Block summary: A- 3 cores B- 3 cores and remaining fragments (TB:mw 06-12-11) Report signed out from the following location(s) [facility] 1 of 2 FINAL for Klostermann, Chayil C [phone]) Report signed out from the following location(s)(continued) [address],[place] [address].CLIA:[id number],[id number]., [facility] [address] [place], [place], [place] [address]. CLIA #: [id number], 2 of  FINAL DIAGNOSIS Diagnosis Breast, right, needle core biopsy, mass, 12:30 o'clock, upper - FIBROADENOMA. - THERE IS NO EVIDENCE OF MALIGNANCY. - SEE COMMENT. Microscopic Comment The  results were called to [facility] on 06/09/2011. Pecola Leisure MD Pathologist, Electronic Signature  ADDITIONAL INFORMATION: 1. PROGNOSTIC INDICATORS - ACIS Results IMMUNOHISTOCHEMICAL AND MORPHOMETRIC ANALYSIS BY THE AUTOMATED CELLULAR IMAGING SYSTEM (ACIS) Estrogen Receptor (Negative, <1%): 100%, STRONG STAINING INTENSITY Progesterone Receptor (Negative, <1%): 87%, STRONG STAINING INTENSITY Proliferation Marker Ki67 by M IB-1 (Low<20%): 14% All controls stained appropriately Pecola Leisure MD Pathologist, Electronic Signature ( Signed 06/02/2011) 1. 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 THE RATIO OF HER-2: CEP 17 SIGNALS WAS 1.41. Reference range: Ratio: HER2:CEP17 < 1.8 - gene amplification not observed Ratio: HER2:CEP 17 1.8-2.2 -  equivocal result Ratio: HER2:CEP17 > 2.2 - gene amplification observed Pecola Leisure MD Pathologist, Electronic Signature ( Signed 06/01/2011) 1 of 3 FINAL for Roell, Jahzaria ([id number]) FINAL DIAGNOSIS Diagnosis 1. Breast, right, needle core biopsy, 9 o'clock - HIGH GRADE DUCTAL CARCINOMA IN SITU - SEE COMMENT. 2. Lymph node, biopsy, Right axilla - POSITIVE FOR DUCTAL CARCINOMA. - SEE COMMENT. 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 Patient presented with calcifications in the right breast with right axillary lymph node that was positive for invasive ductal carcinoma. The area of concern in the right buttocks the underlying muscle and therefore patient is being offered neoadjuvant chemotherapy. Patient and I have discussed treatment with Taxotere and Cytoxan. Plan is to get give her about 4-6 cycles of treatment.  #2 once patient completes her treatment she will then go on to get her surgery performed. Because tumor is ER positive she will eventually need antiestrogen therapy. There will be tamoxifen.  #3 patient has had genetic counseling and testing performed and results of which are pending at this time  .#4 patient is now status post cycle 1 day 1 of chemotherapy. She is here for interim lapse her blood count looks terrific her white count is 7.0 she is without any complaints.   PLAN:  #1. Overall patient is doing well.  #2 she knows to call with any problems. I will plan on seeing her back prior to cycle 2 of her treatment.  All questions were answered. The patient knows to call the clinic with any problems, questions or concerns. We can certainly see the patient much sooner if necessary.  I spent 30 minutes counseling the patient face to face. The total time spent in the appointment was 30 minutes.    Drue Second, MD Medical/Oncology [facility] [phone]  (beeper) [phone] (Office)  07/04/2011, 10:09 AM

## 2011-07-04 NOTE — Patient Instructions (Addendum)
Tolerated chemotherapy well.  You will be seen back on 07/18/11 for cycle 2 of TC

## 2011-07-18 ENCOUNTER — Other Ambulatory Visit (HOSPITAL_BASED_OUTPATIENT_CLINIC_OR_DEPARTMENT_OTHER): Payer: BC Managed Care – PPO | Admitting: Lab

## 2011-07-18 ENCOUNTER — Ambulatory Visit (HOSPITAL_BASED_OUTPATIENT_CLINIC_OR_DEPARTMENT_OTHER): Payer: BC Managed Care – PPO | Admitting: Oncology

## 2011-07-18 ENCOUNTER — Encounter: Payer: Self-pay | Admitting: Oncology

## 2011-07-18 ENCOUNTER — Telehealth: Payer: Self-pay | Admitting: *Deleted

## 2011-07-18 ENCOUNTER — Ambulatory Visit (HOSPITAL_BASED_OUTPATIENT_CLINIC_OR_DEPARTMENT_OTHER): Payer: BC Managed Care – PPO

## 2011-07-18 VITALS — BP 107/68 | HR 78 | Temp 98.6°F | Ht 68.5 in | Wt 128.6 lb

## 2011-07-18 DIAGNOSIS — Z5111 Encounter for antineoplastic chemotherapy: Secondary | ICD-10-CM

## 2011-07-18 DIAGNOSIS — Z170 Estrogen receptor positive status [ER+]: Secondary | ICD-10-CM

## 2011-07-18 DIAGNOSIS — C50419 Malignant neoplasm of upper-outer quadrant of unspecified female breast: Secondary | ICD-10-CM

## 2011-07-18 DIAGNOSIS — C773 Secondary and unspecified malignant neoplasm of axilla and upper limb lymph nodes: Secondary | ICD-10-CM

## 2011-07-18 LAB — CBC WITH DIFFERENTIAL/PLATELET
BASO%: 0.1 % (ref 0.0–2.0)
Basophils Absolute: 0 10*3/uL (ref 0.0–0.1)
EOS%: 0 % (ref 0.0–7.0)
HGB: 11.8 g/dL (ref 11.6–15.9)
MCH: 29.4 pg (ref 25.1–34.0)
RBC: 4.02 10*6/uL (ref 3.70–5.45)
RDW: 12.9 % (ref 11.2–14.5)
lymph#: 1 10*3/uL (ref 0.9–3.3)
nRBC: 0 % (ref 0–0)

## 2011-07-18 LAB — COMPREHENSIVE METABOLIC PANEL
ALT: 19 U/L (ref 0–35)
AST: 20 U/L (ref 0–37)
Albumin: 4.4 g/dL (ref 3.5–5.2)
Alkaline Phosphatase: 84 U/L (ref 39–117)
Glucose, Bld: 116 mg/dL — ABNORMAL HIGH (ref 70–99)
Potassium: 3.8 mEq/L (ref 3.5–5.3)
Sodium: 142 mEq/L (ref 135–145)
Total Bilirubin: 0.3 mg/dL (ref 0.3–1.2)
Total Protein: 7.2 g/dL (ref 6.0–8.3)

## 2011-07-18 MED ORDER — DOCETAXEL CHEMO INJECTION 160 MG/16ML
75.0000 mg/m2 | Freq: Once | INTRAVENOUS | Status: AC
  Administered 2011-07-18: 120 mg via INTRAVENOUS
  Filled 2011-07-18: qty 12

## 2011-07-18 MED ORDER — DEXAMETHASONE SODIUM PHOSPHATE 4 MG/ML IJ SOLN
20.0000 mg | Freq: Once | INTRAMUSCULAR | Status: AC
  Administered 2011-07-18: 20 mg via INTRAVENOUS

## 2011-07-18 MED ORDER — SODIUM CHLORIDE 0.9 % IV SOLN
600.0000 mg/m2 | Freq: Once | INTRAVENOUS | Status: AC
  Administered 2011-07-18: 1000 mg via INTRAVENOUS
  Filled 2011-07-18: qty 50

## 2011-07-18 MED ORDER — ONDANSETRON 16 MG/50ML IVPB (CHCC)
16.0000 mg | Freq: Once | INTRAVENOUS | Status: AC
Start: 2011-07-18 — End: 2011-07-18
  Administered 2011-07-18: 16 mg via INTRAVENOUS

## 2011-07-18 MED ORDER — SODIUM CHLORIDE 0.9 % IV SOLN
Freq: Once | INTRAVENOUS | Status: AC
  Administered 2011-07-18: 100 mL via INTRAVENOUS

## 2011-07-18 MED ORDER — HEPARIN SOD (PORK) LOCK FLUSH 100 UNIT/ML IV SOLN
500.0000 [IU] | Freq: Once | INTRAVENOUS | Status: AC | PRN
  Administered 2011-07-18: 500 [IU]
  Filled 2011-07-18: qty 5

## 2011-07-18 MED ORDER — SODIUM CHLORIDE 0.9 % IJ SOLN
10.0000 mL | INTRAMUSCULAR | Status: DC | PRN
  Administered 2011-07-18: 10 mL
  Filled 2011-07-18: qty 10

## 2011-07-18 NOTE — Patient Instructions (Addendum)
[facility] Discharge Instructions for Patients Receiving Chemotherapy  Today you received the following chemotherapy agents Taxotere/Cytoxan To help prevent nausea and vomiting after your treatment, we encourage you to take your nausea medication as per Dr. Khan.    If you develop nausea and vomiting that is not controlled by your nausea medication, call the clinic. If it is after clinic hours your family physician or the after hours number for the clinic or go to the Emergency Department.   BELOW ARE SYMPTOMS THAT SHOULD BE REPORTED IMMEDIATELY:  *FEVER GREATER THAN 100.5 F  *CHILLS WITH OR WITHOUT FEVER  NAUSEA AND VOMITING THAT IS NOT CONTROLLED WITH YOUR NAUSEA MEDICATION  *UNUSUAL SHORTNESS OF BREATH  *UNUSUAL BRUISING OR BLEEDING  TENDERNESS IN MOUTH AND THROAT WITH OR WITHOUT PRESENCE OF ULCERS  *URINARY PROBLEMS  *BOWEL PROBLEMS  UNUSUAL RASH Items with * indicate a potential emergency and should be followed up as soon as possible.  . Feel free to call the clinic you have any questions or concerns. The clinic phone number is [phone].   I have been informed and understand all the instructions given to me. I know to contact the clinic, my physician, or go to the Emergency Department if any problems should occur. I do not have any questions at this time, but understand that I may call the clinic during office hours   should I have any questions or need assistance in obtaining follow up care.    __________________________________________  _____________  __________ Signature of Patient or Authorized Representative            Date                   Time    __________________________________________ Nurse's Signature    

## 2011-07-18 NOTE — Telephone Encounter (Signed)
Patient all ready had schedule for next visit

## 2011-07-18 NOTE — Progress Notes (Signed)
OFFICE PROGRESS NOTE  CC  [name],[name], MD [address] [place] [address] Dr. Cyndia Bent Dr. 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 She had an ultrasound that revealed an area ofconcern measuring 2.9 cm. She also was found to have a right axillary lymph node that was suspicious for malignancy. Patient went on to have biopsy of the calcifications that showed high-grade ductal carcinoma in situ. Biopsy of the right axillary lymph node revealed an invasive ductal carcinoma. The tumor was ER positive PR positive HER-2/neu negative with a Ki-67 of 14%.    #2 on MRI patient was also found to have other areas of concern in the contralateral breast. Also was noted that in the right breast the tumor abutted the chest wall.since then patient has gone on to have biopsies performed at the other areas that are negative for a malignancy. The pathology  is shown below.  #3 patient was begun on neoadjuvant chemotherapy consisting of Taxotere and Cytoxan on 06/27/2011. A total of 4-6 cycles is planned.  CURRENT THERAPY: cycle 2 of TC with day 2 Neulasta.  INTERVAL HISTORY: Carolyn [name] 41 y.o. female returns for followup visit. She looks absolutely remarkable. Overall she has tolerated the first cycle of  Her chemotherapy very nicely. Her counts have recovered. She is she continuing to do her usual things including a lot of walking. She denies any headaches double vision blurring of vision she denies any difficulty swallowing no shortness of breath no chest pains no  palpitations no abdominal pain no diarrhea constipation she has denied any changes in her nails or her skin. She denies any peripheral paresthesias. No bleeding problems. No visual disturbances. Remainder of the 10 point review of systems is negative.  MEDICAL HISTORY: Past Medical History  Diagnosis Date  . Breast cancer     ALLERGIES:  is allergic to allegra and shellfish allergy.  MEDICATIONS:  Current Outpatient Prescriptions  Medication Sig Dispense Refill  . Cholecalciferol (VITAMIN D3) 2000 UNITS TABS Take 2,000 Units by mouth daily.      Marland Kitchen lidocaine-prilocaine (EMLA) cream Apply topically as needed. Apply to port area 45 mins prior to treatment.  30 g  5  . lidocaine-prilocaine (EMLA) cream Apply topically as needed.  30 g  6  . Loratadine 10 MG CAPS Take 10 mg by mouth daily.      . Multiple Vitamin (MULTIVITAMIN) tablet Take 1 tablet by mouth daily.      . prochlorperazine (COMPAZINE) 10 MG tablet Take 1 tablet (10 mg total) by mouth every 6 (six) hours as needed.  30 tablet  2    SURGICAL HISTORY:  Past Surgical History  Procedure Date  . Wisdom tooth extraction   . Breast biopsy     Left  . Portacath placement 06/20/2011    Procedure: INSERTION PORT-A-CATH;  Surgeon: [name], MD;  Location: [facility] OR;  Service: General;  Laterality: N/A;    REVIEW OF SYSTEMS:  Pertinent items are noted in HPI.   PHYSICAL EXAMINATION: General appearance: alert, cooperative and appears stated age Lymph nodes: Cervical, supraclavicular, and axillary nodes normal. Resp: clear  to auscultation bilaterally and normal percussion bilaterally Back: symmetric, no curvature. ROM normal. No CVA tenderness. Cardio: regular rate and rhythm, S1, S2 normal, no murmur, click, rub or gallop GI: soft, non-tender; bowel sounds normal; no masses,  no organomegaly Extremities: extremities normal, atraumatic, no cyanosis or edema Neurologic: Grossly normal Breast examination right breast reveals a  palpable mass left breast no masses but there is nodularity noted right axillary lymph node is palpable exam ECOG PERFORMANCE STATUS: 0 - Asymptomatic  Blood pressure 107/68, pulse 78, temperature 98.6 F (37 C), temperature source Oral, height 5' 8.5" (1.74 m), weight 128 lb 9.6 oz (58.333 kg).  LABORATORY DATA: Lab Results  Component Value Date   WBC 9.5 07/18/2011   HGB 11.8 07/18/2011   HCT 34.5* 07/18/2011   MCV 85.8 07/18/2011   PLT 222 07/18/2011      Chemistry      Component Value Date/Time   NA 140 07/04/2011 0901   K 4.3 07/04/2011 0901   CL 103 07/04/2011 0901   CO2 32 07/04/2011 0901   BUN 12 07/04/2011 0901   CREATININE 1.21* 07/04/2011 0901      Component Value Date/Time   CALCIUM 9.4 07/04/2011 0901   ALKPHOS 83 07/04/2011 0901   AST 24 07/04/2011 0901   ALT 20 07/04/2011 0901   BILITOT 0.3 07/04/2011 0901    FINAL DIAGNOSIS Diagnosis Breast, left, needle core biopsy, central - 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 - THERE IS NO EVIDENCE OF MALIGNANCY. - SEE COMMENT. Microscopic Comment The results were called to [facility] on 06/13/2011. (JK:mw 06/13/11 Pecola Leisure MD Pathologist, Electronic Signature (Case signed 06/13/2011) Specimen Gross and Clinical Information Specimen Comment Known right cancer. Linear enhancement w/ MRI central / posterior left breast Specimen(s) Obtained: Breast, left, needle core biopsy, central Specimen Clinical Information R/O DCIS Gross Received in formalin are six soft to semifirm white tan tissue cores which measure 3.0 x 0.4 x 0.4 cm up to 4.2 x 0.3 x 0.3 cm. The specimen is entirely submitted in two blocks. Block summary: A- 3 cores B- 3 cores and remaining fragments (TB:mw 06-12-11) Report signed out from the following location(s) [facility] 1 of 2 FINAL for [name], [name] [phone]) Report signed out from the following location(s)(continued) [address],[place] [address].CLIA:[id number],CAP:[id number]., [facility] [address] [place], [place], [place] [address]. CLIA #: [id number], 2 of  FINAL DIAGNOSIS Diagnosis Breast, right, needle core biopsy, mass, 12:30 o'clock, upper - FIBROADENOMA. - THERE IS NO EVIDENCE OF MALIGNANCY. - SEE COMMENT. Microscopic Comment The results were called to [facility] on 06/09/2011. Pecola Leisure MD Pathologist, Electronic Signature  ADDITIONAL INFORMATION: 1. PROGNOSTIC INDICATORS - ACIS Results IMMUNOHISTOCHEMICAL AND MORPHOMETRIC ANALYSIS BY THE AUTOMATED CELLULAR IMAGING SYSTEM (ACIS) Estrogen Receptor (Negative, <1%): 100%, STRONG STAINING INTENSITY Progesterone Receptor (Negative, <1%): 87%, STRONG STAINING INTENSITY Proliferation Marker Ki67 by M IB-1 (Low<20%): 14% All controls stained appropriately Pecola Leisure MD Pathologist, Electronic Signature ( Signed 06/02/2011) 1. 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 THE RATIO OF HER-2: CEP 17 SIGNALS WAS 1.41. Reference range: Ratio: HER2:CEP17 < 1.8 - gene amplification not observed Ratio: HER2:CEP 17 1.8-2.2 - equivocal result Ratio: HER2:CEP17 > 2.2 - gene amplification observed Pecola Leisure MD Pathologist, Electronic Signature ( Signed 06/01/2011) 1 of 3 FINAL for Veillon, Lachlyn ([id number]) FINAL DIAGNOSIS Diagnosis 1. Breast, right, needle core biopsy, 9 o'clock - HIGH GRADE DUCTAL  CARCINOMA IN SITU - SEE COMMENT. 2. Lymph node, biopsy, Right axilla - POSITIVE FOR DUCTAL CARCINOMA. - SEE COMMENT. 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 Patient presented with calcifications in the right breast with right axillary lymph node that was positive for invasive ductal carcinoma. The area of concern in the right buttocks the underlying muscle and  therefore patient is being offered neoadjuvant chemotherapy. Patient and I have discussed treatment with Taxotere and Cytoxan. Plan is to get give her about 4-6 cycles of treatment.  #2 once patient completes her treatment she will then go on to get her surgery performed. Because tumor is ER positive she will eventually need antiestrogen therapy. There will be tamoxifen.  #3 patient has had genetic counseling and testing performed and results of which are pending at this time  #4 Patient today is here for cycle #2 of Taxotere and Cytoxan.   PLAN:  #1. We will proceed with her scheduled chemotherapy.Marland Kitchen She will return tomorrow for Neulasta.  #2 we did discuss symptomatic management especially for her nails and skin and eyes.  #3 patient will be seen back on July 18  for interim lapse and office visit. However she knows to call prior to that if anything  All questions were answered. The patient knows to call the clinic with any problems, questions or concerns. We can certainly see the patient much sooner if necessary.  I spent 30 minutes counseling the patient face to face. The total time spent in the appointment was 30 minutes.    Drue Second, MD Medical/Oncology [facility] [phone] (beeper) [phone] (Office)  07/18/2011, 9:14 AM

## 2011-07-18 NOTE — Patient Instructions (Addendum)
Doing well, proceed with cycle 2 of Taxotere and cytoxan today.   You will return tomorrow for neulasta injection.  I will see you back on 7/18 for labs and MD visit  Call with any problems or questions

## 2011-07-19 ENCOUNTER — Ambulatory Visit (HOSPITAL_BASED_OUTPATIENT_CLINIC_OR_DEPARTMENT_OTHER): Payer: BC Managed Care – PPO

## 2011-07-19 VITALS — BP 94/61 | HR 80 | Temp 97.2°F

## 2011-07-19 DIAGNOSIS — C50419 Malignant neoplasm of upper-outer quadrant of unspecified female breast: Secondary | ICD-10-CM

## 2011-07-19 DIAGNOSIS — Z5189 Encounter for other specified aftercare: Secondary | ICD-10-CM

## 2011-07-19 MED ORDER — PEGFILGRASTIM INJECTION 6 MG/0.6ML
6.0000 mg | Freq: Once | SUBCUTANEOUS | Status: AC
  Administered 2011-07-19: 6 mg via SUBCUTANEOUS
  Filled 2011-07-19: qty 0.6

## 2011-07-25 ENCOUNTER — Other Ambulatory Visit: Payer: BC Managed Care – PPO | Admitting: Lab

## 2011-07-25 ENCOUNTER — Ambulatory Visit: Payer: BC Managed Care – PPO | Admitting: Oncology

## 2011-07-27 ENCOUNTER — Ambulatory Visit (HOSPITAL_BASED_OUTPATIENT_CLINIC_OR_DEPARTMENT_OTHER): Payer: BC Managed Care – PPO | Admitting: Oncology

## 2011-07-27 ENCOUNTER — Other Ambulatory Visit (HOSPITAL_BASED_OUTPATIENT_CLINIC_OR_DEPARTMENT_OTHER): Payer: BC Managed Care – PPO | Admitting: Lab

## 2011-07-27 ENCOUNTER — Encounter: Payer: Self-pay | Admitting: Oncology

## 2011-07-27 VITALS — BP 105/69 | HR 86 | Temp 98.6°F | Ht 68.5 in | Wt 128.5 lb

## 2011-07-27 DIAGNOSIS — C50419 Malignant neoplasm of upper-outer quadrant of unspecified female breast: Secondary | ICD-10-CM

## 2011-07-27 DIAGNOSIS — Z170 Estrogen receptor positive status [ER+]: Secondary | ICD-10-CM

## 2011-07-27 DIAGNOSIS — C773 Secondary and unspecified malignant neoplasm of axilla and upper limb lymph nodes: Secondary | ICD-10-CM

## 2011-07-27 LAB — CBC WITH DIFFERENTIAL/PLATELET
EOS%: 0.1 % (ref 0.0–7.0)
LYMPH%: 8 % — ABNORMAL LOW (ref 14.0–49.7)
MCH: 29.4 pg (ref 25.1–34.0)
MCHC: 32.8 g/dL (ref 31.5–36.0)
MCV: 89.6 fL (ref 79.5–101.0)
MONO%: 7.7 % (ref 0.0–14.0)
Platelets: 138 10*3/uL — ABNORMAL LOW (ref 145–400)
RBC: 3.78 10*6/uL (ref 3.70–5.45)
RDW: 13.8 % (ref 11.2–14.5)

## 2011-07-27 LAB — COMPREHENSIVE METABOLIC PANEL
AST: 37 U/L (ref 0–37)
Albumin: 3.9 g/dL (ref 3.5–5.2)
Alkaline Phosphatase: 111 U/L (ref 39–117)
Glucose, Bld: 70 mg/dL (ref 70–99)
Potassium: 4 mEq/L (ref 3.5–5.3)
Sodium: 143 mEq/L (ref 135–145)
Total Bilirubin: 0.2 mg/dL — ABNORMAL LOW (ref 0.3–1.2)
Total Protein: 6.3 g/dL (ref 6.0–8.3)

## 2011-07-27 NOTE — Patient Instructions (Addendum)
You are doing well. I will see you back with cycle 3 of TC.

## 2011-07-27 NOTE — Progress Notes (Signed)
OFFICE PROGRESS NOTE  CC  [name],[name], MD [address] [place] [address] Dr. Cyndia Bent Dr. 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 She had an ultrasound that revealed an area ofconcern measuring 2.9 cm. She also was found to have a right axillary lymph node that was suspicious for malignancy. Patient went on to have biopsy of the calcifications that showed high-grade ductal carcinoma in situ. Biopsy of the right axillary lymph node revealed an invasive ductal carcinoma. The tumor was ER positive PR positive HER-2/neu negative with a Ki-67 of 14%.    #2 on MRI patient was also found to have other areas of concern in the contralateral breast. Also was noted that in the right breast the tumor abutted the chest wall.since then patient has gone on to have biopsies performed at the other areas that are negative for a malignancy. The pathology  is shown below.  #3 patient was begun on neoadjuvant chemotherapy consisting of Taxotere and Cytoxan on 06/27/2011. A total of 4-6 cycles is planned.  CURRENT THERAPY: s/p cycle 2 of TC with day 2 Neulasta.  INTERVAL HISTORY: Carolyn [name] 41 y.o. female returns for followup visit. She looks remarkably well. She is not having any significant side effects from the chemotherapy except for some nausea. She has no skin changes no nipple changes no peripheral paresthesias no weakness fatigue. She continues to work full-time. She denies having any headaches double vision blurring of vision no abdominal pain diarrhea or  constipation no bleeding. Remainder of the 10 point review of systems is negative. MEDICAL HISTORY: Past Medical History  Diagnosis Date  . Breast cancer     ALLERGIES:  is allergic to allegra and shellfish allergy.  MEDICATIONS:  Current Outpatient Prescriptions  Medication Sig Dispense Refill  . Cholecalciferol (VITAMIN D3) 2000 UNITS TABS Take 2,000 Units by mouth daily.      Marland Kitchen lidocaine-prilocaine (EMLA) cream Apply topically as needed. Apply to port area 45 mins prior to treatment.  30 g  5  . lidocaine-prilocaine (EMLA) cream Apply topically as needed.  30 g  6  . Loratadine 10 MG CAPS Take 10 mg by mouth daily.      . Multiple Vitamin (MULTIVITAMIN) tablet Take 1 tablet by mouth daily.      . prochlorperazine (COMPAZINE) 10 MG tablet Take 1 tablet (10 mg total) by mouth every 6 (six) hours as needed.  30 tablet  2    SURGICAL HISTORY:  Past Surgical History  Procedure Date  . Wisdom tooth extraction   . Breast biopsy     Left  . Portacath placement 06/20/2011    Procedure: INSERTION PORT-A-CATH;  Surgeon: [name], MD;  Location: [facility] OR;  Service: General;  Laterality: N/A;    REVIEW OF SYSTEMS:  Pertinent items are noted in HPI.   PHYSICAL EXAMINATION: General appearance: alert, cooperative and appears stated age Lymph nodes: Cervical, supraclavicular, and axillary nodes normal. Resp: clear to auscultation bilaterally and normal percussion bilaterally Back: symmetric, no curvature. ROM normal. No CVA tenderness. Cardio: regular rate and rhythm, S1, S2 normal, no murmur, click, rub or gallop  GI: soft, non-tender; bowel sounds normal; no masses,  no organomegaly Extremities: extremities normal, atraumatic, no cyanosis or edema Neurologic: Grossly normal Breast examination right breast reveals a palpable mass left breast no masses but there is nodularity noted right axillary lymph node is palpable exam ECOG PERFORMANCE STATUS: 0 - Asymptomatic  Blood pressure  105/69, pulse 86, temperature 98.6 F (37 C), temperature source Oral, height 5' 8.5" (1.74 m), weight 128 lb 8 oz (58.287 kg).  LABORATORY DATA: Lab Results  Component Value Date   WBC 18.2* 07/27/2011   HGB 11.1* 07/27/2011   HCT 33.9* 07/27/2011   MCV 89.6 07/27/2011   PLT 138* 07/27/2011      Chemistry      Component Value Date/Time   NA 142 07/18/2011 0844   K 3.8 07/18/2011 0844   CL 105 07/18/2011 0844   CO2 27 07/18/2011 0844   BUN 14 07/18/2011 0844   CREATININE 0.62 07/18/2011 0844      Component Value Date/Time   CALCIUM 9.6 07/18/2011 0844   ALKPHOS 84 07/18/2011 0844   AST 20 07/18/2011 0844   ALT 19 07/18/2011 0844   BILITOT 0.3 07/18/2011 0844    FINAL DIAGNOSIS Diagnosis Breast, left, needle core biopsy, central - 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 - THERE IS NO EVIDENCE OF MALIGNANCY. - SEE COMMENT. Microscopic Comment The results were called to [facility] on 06/13/2011. (JK:mw 06/13/11 Pecola Leisure MD Pathologist, Electronic Signature (Case signed 06/13/2011) Specimen Gross and Clinical Information Specimen Comment Known right cancer. Linear enhancement w/ MRI central / posterior left breast Specimen(s) Obtained: Breast, left, needle core biopsy, central Specimen Clinical Information R/O DCIS Gross Received in formalin are six soft to semifirm white tan tissue cores which measure 3.0 x 0.4 x 0.4 cm up to 4.2 x 0.3 x 0.3 cm. The specimen is entirely submitted in two blocks. Block summary: A- 3 cores B- 3 cores and remaining fragments (TB:mw 06-12-11) Report signed out from the following location(s) [facility] 1 of 2 FINAL for [place], Ximena C [phone]) Report signed out from the following location(s)(continued) [address],[place] [address].CLIA:[id number],CAP:[id number]., [facility] [address] [place], [place], [place] [address]. CLIA #: [id number], 2 of  FINAL  DIAGNOSIS Diagnosis Breast, right, needle core biopsy, mass, 12:30 o'clock, upper - FIBROADENOMA. - THERE IS NO EVIDENCE OF MALIGNANCY. - SEE COMMENT. Microscopic Comment The results were called to [facility] on 06/09/2011. Pecola Leisure MD Pathologist, Electronic Signature  ADDITIONAL INFORMATION: 1. PROGNOSTIC INDICATORS - ACIS Results IMMUNOHISTOCHEMICAL AND MORPHOMETRIC ANALYSIS BY THE AUTOMATED CELLULAR IMAGING SYSTEM (ACIS) Estrogen Receptor (Negative, <1%): 100%, STRONG STAINING INTENSITY Progesterone Receptor (Negative, <1%): 87%, STRONG STAINING INTENSITY Proliferation Marker Ki67 by M IB-1 (Low<20%): 14% All controls stained appropriately Pecola Leisure MD Pathologist, Electronic Signature ( Signed 06/02/2011) 1. 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 THE RATIO OF HER-2: CEP 17 SIGNALS WAS 1.41. Reference range: Ratio: HER2:CEP17 < 1.8 - gene amplification not observed Ratio: HER2:CEP 17 1.8-2.2 - equivocal result Ratio: HER2:CEP17 > 2.2 - gene amplification observed Pecola Leisure MD Pathologist, Electronic Signature ( Signed 06/01/2011) 1 of 3 FINAL for Cerino, Ricarda ([id number]) FINAL DIAGNOSIS Diagnosis 1. Breast, right, needle core biopsy, 9 o'clock - HIGH GRADE DUCTAL CARCINOMA IN SITU - SEE COMMENT. 2. Lymph node, biopsy, Right axilla - POSITIVE FOR DUCTAL CARCINOMA. - SEE COMMENT. 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 Patient presented with calcifications in the right breast with right axillary lymph node that was positive for invasive ductal carcinoma. The area of concern in the right buttocks the underlying muscle and therefore patient is being offered neoadjuvant chemotherapy. Patient and I have discussed treatment with Taxotere and Cytoxan. Plan is to get give her about 4-6  cycles of treatment.  #2 once patient completes her treatment she will then go on to get her surgery performed. Because tumor is ER positive she will eventually need antiestrogen therapy. There will be tamoxifen.  #3 patient has had genetic counseling and testing performed and results of which are pending at this time  #4 Status post cycle 2 of TC   PLAN:  #1. Overall patient is tolerating her chemotherapy well. Her blood count looks terrific she is not having any complications from the chemotherapy.  #2 she will be seen back in 2 weeks' time for cycle #3 of Taxotere and Cytoxan.  All questions were answered. The patient knows to call the clinic with any problems, questions or concerns. We can certainly see the patient much sooner if necessary.  I spent 30 minutes counseling the patient face to face. The total time spent in the appointment was 30 minutes.    Drue Second, MD Medical/Oncology [facility] [phone] (beeper) [phone] (Office)  07/27/2011, 10:46 AM

## 2011-08-01 ENCOUNTER — Encounter (INDEPENDENT_AMBULATORY_CARE_PROVIDER_SITE_OTHER): Payer: Self-pay | Admitting: Surgery

## 2011-08-01 ENCOUNTER — Ambulatory Visit (INDEPENDENT_AMBULATORY_CARE_PROVIDER_SITE_OTHER): Payer: BC Managed Care – PPO | Admitting: Surgery

## 2011-08-01 VITALS — BP 98/62 | HR 68 | Temp 97.4°F | Resp 14 | Ht 68.5 in | Wt 129.5 lb

## 2011-08-01 DIAGNOSIS — Z09 Encounter for follow-up examination after completed treatment for conditions other than malignant neoplasm: Secondary | ICD-10-CM

## 2011-08-01 NOTE — Patient Instructions (Signed)
See me again the first week of September. However, if you're last chemotherapy treatment turns out to be the August 20 treatment I need to see you in August instead of September

## 2011-08-01 NOTE — Progress Notes (Signed)
NAME: Carolyn [name]                                            DOB: 04-28-1970 DATE: 08/01/2011                                                  MRN: [id number]  CC: Post op   HPI: This patient comes in for post op follow-up.Sheunderwent PAC on 06/20/11. She feels that she is doing well.  PE:  VITAL SIGNS: BP 98/62  Pulse 68  Temp 97.4 F (36.3 C) (Temporal)  Resp 14  Ht 5' 8.5" (1.74 m)  Wt 129 lb 8 oz (58.741 kg)  BMI 19.40 kg/m2  General: The patient appears to be healthy, NAD Wounds well healed  DATA REVIEWED: CXR post placement OK  IMPRESSION: The patient is doing well S/P PAC. Neoadjuvant chemo underway    PLAN: She will return to see me in early September to make surgical plans

## 2011-08-07 ENCOUNTER — Other Ambulatory Visit: Payer: Self-pay | Admitting: *Deleted

## 2011-08-08 ENCOUNTER — Telehealth: Payer: Self-pay | Admitting: *Deleted

## 2011-08-08 ENCOUNTER — Ambulatory Visit (HOSPITAL_BASED_OUTPATIENT_CLINIC_OR_DEPARTMENT_OTHER): Payer: BC Managed Care – PPO

## 2011-08-08 ENCOUNTER — Other Ambulatory Visit (HOSPITAL_BASED_OUTPATIENT_CLINIC_OR_DEPARTMENT_OTHER): Payer: BC Managed Care – PPO | Admitting: Lab

## 2011-08-08 ENCOUNTER — Other Ambulatory Visit: Payer: Self-pay | Admitting: Medical Oncology

## 2011-08-08 ENCOUNTER — Encounter: Payer: Self-pay | Admitting: Oncology

## 2011-08-08 ENCOUNTER — Ambulatory Visit (HOSPITAL_BASED_OUTPATIENT_CLINIC_OR_DEPARTMENT_OTHER): Payer: BC Managed Care – PPO | Admitting: Oncology

## 2011-08-08 VITALS — BP 117/73 | HR 89 | Temp 98.1°F | Ht 68.5 in | Wt 133.0 lb

## 2011-08-08 DIAGNOSIS — C50419 Malignant neoplasm of upper-outer quadrant of unspecified female breast: Secondary | ICD-10-CM

## 2011-08-08 DIAGNOSIS — Z170 Estrogen receptor positive status [ER+]: Secondary | ICD-10-CM

## 2011-08-08 DIAGNOSIS — Z5111 Encounter for antineoplastic chemotherapy: Secondary | ICD-10-CM

## 2011-08-08 LAB — CBC WITH DIFFERENTIAL/PLATELET
Basophils Absolute: 0 10*3/uL (ref 0.0–0.1)
EOS%: 0 % (ref 0.0–7.0)
Eosinophils Absolute: 0 10*3/uL (ref 0.0–0.5)
HGB: 11.7 g/dL (ref 11.6–15.9)
LYMPH%: 9.2 % — ABNORMAL LOW (ref 14.0–49.7)
MCH: 29.5 pg (ref 25.1–34.0)
MCV: 87.9 fL (ref 79.5–101.0)
MONO%: 0.5 % (ref 0.0–14.0)
NEUT#: 3.7 10*3/uL (ref 1.5–6.5)
Platelets: 185 10*3/uL (ref 145–400)
RBC: 3.96 10*6/uL (ref 3.70–5.45)
RDW: 14.1 % (ref 11.2–14.5)

## 2011-08-08 LAB — COMPREHENSIVE METABOLIC PANEL
Alkaline Phosphatase: 103 U/L (ref 39–117)
BUN: 14 mg/dL (ref 6–23)
CO2: 24 mEq/L (ref 19–32)
Creatinine, Ser: 0.72 mg/dL (ref 0.50–1.10)
Glucose, Bld: 121 mg/dL — ABNORMAL HIGH (ref 70–99)
Sodium: 139 mEq/L (ref 135–145)
Total Bilirubin: 0.3 mg/dL (ref 0.3–1.2)

## 2011-08-08 MED ORDER — DEXAMETHASONE 4 MG PO TABS: 4.0000 mg | ORAL_TABLET | Freq: Two times a day (BID) | ORAL | Status: DC

## 2011-08-08 MED ORDER — SODIUM CHLORIDE 0.9 % IV SOLN
Freq: Once | INTRAVENOUS | Status: AC
  Administered 2011-08-08: 10:00:00 via INTRAVENOUS

## 2011-08-08 MED ORDER — HEPARIN SOD (PORK) LOCK FLUSH 100 UNIT/ML IV SOLN
500.0000 [IU] | Freq: Once | INTRAVENOUS | Status: AC | PRN
  Administered 2011-08-08: 500 [IU]
  Filled 2011-08-08: qty 5

## 2011-08-08 MED ORDER — DEXAMETHASONE SODIUM PHOSPHATE 4 MG/ML IJ SOLN
20.0000 mg | Freq: Once | INTRAMUSCULAR | Status: AC
  Administered 2011-08-08: 20 mg via INTRAVENOUS

## 2011-08-08 MED ORDER — DOCETAXEL CHEMO INJECTION 160 MG/16ML
75.0000 mg/m2 | Freq: Once | INTRAVENOUS | Status: AC
  Administered 2011-08-08: 120 mg via INTRAVENOUS
  Filled 2011-08-08: qty 12

## 2011-08-08 MED ORDER — ONDANSETRON 16 MG/50ML IVPB (CHCC)
16.0000 mg | Freq: Once | INTRAVENOUS | Status: AC
  Administered 2011-08-08: 16 mg via INTRAVENOUS

## 2011-08-08 MED ORDER — SODIUM CHLORIDE 0.9 % IJ SOLN
10.0000 mL | INTRAMUSCULAR | Status: DC | PRN
  Administered 2011-08-08: 10 mL
  Filled 2011-08-08: qty 10

## 2011-08-08 MED ORDER — SODIUM CHLORIDE 0.9 % IV SOLN
600.0000 mg/m2 | Freq: Once | INTRAVENOUS | Status: AC
  Administered 2011-08-08: 1000 mg via INTRAVENOUS
  Filled 2011-08-08: qty 50

## 2011-08-08 NOTE — Patient Instructions (Addendum)
Proceed with cycle #3 of TC  I will see you back on 08/15/11

## 2011-08-08 NOTE — Progress Notes (Signed)
OFFICE PROGRESS NOTE  CC  Carolyn [name],[name], MD [address], [address] [place] [address] Dr. Cyndia Bent Dr. 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 She had an ultrasound that revealed an area ofconcern measuring 2.9 cm. She also was found to have a right axillary lymph node that was suspicious for malignancy. Patient went on to have biopsy of the calcifications that showed high-grade ductal carcinoma in situ. Biopsy of the right axillary lymph node revealed an invasive ductal carcinoma. The tumor was ER positive PR positive HER-2/neu negative with a Ki-67 of 14%.    #2 on MRI patient was also found to have other areas of concern in the contralateral breast. Also was noted that in the right breast the tumor abutted the chest wall.since then patient has gone on to have biopsies performed at the other areas that are negative for a malignancy. The pathology  is shown below.  #3 patient was begun on neoadjuvant chemotherapy consisting of Taxotere and Cytoxan on 06/27/2011. A total of 4 cycles is planned.  CURRENT THERAPY: cycle 3 of TC with day 2 Neulasta.  INTERVAL HISTORY: DYANI BABEL 41 y.o. female returns for followup visit. She looks remarkably well. She is not having any significant side effects from the chemotherapy except for some nausea. She has no skin changes no nipple changes no peripheral paresthesias no weakness fatigue. She continues to work full-time. She denies having any headaches double vision blurring of vision no abdominal pain diarrhea or constipation no  bleeding. Remainder of the 10 point review of systems is negative. MEDICAL HISTORY: Past Medical History  Diagnosis Date  . Breast cancer     ALLERGIES:  is allergic to allegra and shellfish allergy.  MEDICATIONS:  Current Outpatient Prescriptions  Medication Sig Dispense Refill  . acetaminophen (TYLENOL) 500 MG tablet Take 1,000 mg by mouth every 6 (six) hours as needed.      . Cholecalciferol (VITAMIN D3) 2000 UNITS TABS Take 2,000 Units by mouth daily.      Marland Kitchen lidocaine-prilocaine (EMLA) cream Apply topically as needed. Apply to port area 45 mins prior to treatment.  30 g  5  . lidocaine-prilocaine (EMLA) cream Apply topically as needed.  30 g  6  . Loratadine 10 MG CAPS Take 10 mg by mouth daily.      . Multiple Vitamin (MULTIVITAMIN) tablet Take 1 tablet by mouth daily.      Marland Kitchen dexamethasone (DECADRON) 4 MG tablet Take 1 tablet (4 mg total) by mouth 2 (two) times daily with a meal. Two tablets twice a day the day before chemotherapy.  Two tablets twice a day for 3 days after chemotherapy  30 tablet  0    SURGICAL HISTORY:  Past Surgical History  Procedure Date  . Wisdom tooth extraction   . Breast biopsy     Left  . Portacath placement 06/20/2011    Procedure: INSERTION PORT-A-CATH;  Surgeon: [name], MD;  Location: [facility] OR;  Service: General;  Laterality: N/A;    REVIEW OF SYSTEMS:  Pertinent items are noted in HPI.   PHYSICAL EXAMINATION: General appearance: alert, cooperative and appears  stated age Lymph nodes: Cervical, supraclavicular, and axillary nodes normal. Resp: clear to auscultation bilaterally and normal percussion bilaterally Back: symmetric, no curvature. ROM normal. No CVA tenderness. Cardio: regular rate and rhythm, S1, S2 normal, no murmur, click, rub or gallop GI: soft, non-tender; bowel sounds normal; no masses,  no organomegaly Extremities: extremities normal, atraumatic, no cyanosis or edema Neurologic: Grossly normal Breast examination right  breast reveals a palpable mass left breast no masses but there is nodularity noted right axillary lymph node is palpable exam ECOG PERFORMANCE STATUS: 0 - Asymptomatic  Blood pressure 117/73, pulse 89, temperature 98.1 F (36.7 C), temperature source Oral, height 5' 8.5" (1.74 m), weight 133 lb (60.328 kg).  LABORATORY DATA: Lab Results  Component Value Date   WBC 4.1 08/08/2011   HGB 11.7 08/08/2011   HCT 34.8 08/08/2011   MCV 87.9 08/08/2011   PLT 185 08/08/2011      Chemistry      Component Value Date/Time   NA 143 07/27/2011 1013   K 4.0 07/27/2011 1013   CL 104 07/27/2011 1013   CO2 37* 07/27/2011 1013   BUN 9 07/27/2011 1013   CREATININE 0.84 07/27/2011 1013      Component Value Date/Time   CALCIUM 9.2 07/27/2011 1013   ALKPHOS 111 07/27/2011 1013   AST 37 07/27/2011 1013   ALT 46* 07/27/2011 1013   BILITOT 0.2* 07/27/2011 1013    FINAL DIAGNOSIS Diagnosis Breast, left, needle core biopsy, central - 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 - THERE IS NO EVIDENCE OF MALIGNANCY. - SEE COMMENT. Microscopic Comment The results were called to [facility] on 06/13/2011. (JK:mw 06/13/11 Pecola Leisure MD Pathologist, Electronic Signature (Case signed 06/13/2011) Specimen Gross and Clinical Information Specimen Comment Known right cancer. Linear enhancement w/ MRI central / posterior left breast Specimen(s) Obtained: Breast, left, needle core biopsy, central Specimen Clinical Information R/O DCIS Gross Received in formalin are six soft to semifirm white tan tissue cores which measure 3.0 x 0.4 x 0.4 cm up to 4.2 x 0.3 x 0.3 cm. The specimen is entirely submitted in two blocks. Block summary: A- 3 cores B- 3 cores and remaining fragments (TB:mw 06-12-11) Report signed out from the following location(s) [facility] 1 of 2 FINAL for [place], [place] C [phone]) Report signed out from the following location(s)(continued) [address],[place] [address].CLIA:[id number],CAP:[id number]., [facility] [address] [place], [place], [place] [address]. CLIA #: [id number], 2 of  FINAL DIAGNOSIS Diagnosis Breast, right, needle core biopsy, mass, 12:30 o'clock, upper - FIBROADENOMA. - THERE IS NO EVIDENCE OF MALIGNANCY. - SEE COMMENT. Microscopic Comment The results were called to [facility] on 06/09/2011. Pecola Leisure MD Pathologist, Electronic Signature  ADDITIONAL INFORMATION: 1. PROGNOSTIC INDICATORS - ACIS Results IMMUNOHISTOCHEMICAL AND MORPHOMETRIC ANALYSIS BY THE AUTOMATED CELLULAR IMAGING SYSTEM (ACIS) Estrogen Receptor (Negative, <1%): 100%, STRONG STAINING INTENSITY Progesterone Receptor (Negative, <1%): 87%, STRONG STAINING INTENSITY Proliferation Marker Ki67 by M IB-1 (Low<20%): 14% All controls stained appropriately Pecola Leisure MD Pathologist, Electronic Signature ( Signed 06/02/2011) 1. 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 THE RATIO OF HER-2: CEP 17 SIGNALS WAS 1.41. Reference range: Ratio: HER2:CEP17 < 1.8 - gene amplification not observed Ratio: HER2:CEP 17 1.8-2.2 - equivocal result Ratio: HER2:CEP17 > 2.2 - gene amplification observed Pecola Leisure MD Pathologist, Electronic Signature ( Signed 06/01/2011) 1 of 3 FINAL for Eggert, Glorianne ([id number]) FINAL DIAGNOSIS Diagnosis 1. Breast,  right, needle core biopsy, 9 o'clock - HIGH GRADE DUCTAL CARCINOMA IN SITU - SEE COMMENT. 2. Lymph node, biopsy, Right axilla - POSITIVE FOR DUCTAL CARCINOMA. - SEE COMMENT. 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 Patient presented with calcifications in the right breast with right axillary lymph node that was positive for invasive ductal carcinoma. The area of concern in the right buttocks the  underlying muscle and therefore patient is being offered neoadjuvant chemotherapy. Patient and I have discussed treatment with Taxotere and Cytoxan. Plan is to get give her about 4 cycles of treatment.  #2 once patient completes her treatment she will then go on to get her surgery performed. Because tumor is ER positive she will eventually need antiestrogen therapy. There will be tamoxifen.  #3 patient has had genetic counseling and testing performed and results of which are pending at this time  #4 Patient will now proceed with cycle #3 of Taxotere and Cytoxan.   PLAN:  #1.Patient will receive her cycle 3 of 4 TC regimen. Once she completes cycle 4 we will then plan on getting MRI of the breasts performed to evaluate response.  #2 patient will be seen back in one week's time for interim labs and followup.  All questions were answered. The patient knows to call the clinic with any problems, questions or concerns. We can certainly see the patient much sooner if necessary.  I spent 25 minutes counseling the patient face to face. The total time spent in the appointment was 30 minutes.    Drue Second, MD Medical/Oncology [facility] [phone] (beeper) [phone] (Office)  08/08/2011, 9:36 AM

## 2011-08-08 NOTE — Patient Instructions (Addendum)
[facility] Discharge Instructions for Patients Receiving Chemotherapy  Today you received the following chemotherapy agents :  Taxotere,  Cytoxan  To help prevent nausea and vomiting after your treatment, we encourage you to take your nausea medication as instructed by your physician , and take meds as needed for nausea.    If you develop nausea and vomiting that is not controlled by your nausea medication, call the clinic. If it is after clinic hours your family physician or the after hours number for the clinic or go to the Emergency Department.   BELOW ARE SYMPTOMS THAT SHOULD BE REPORTED IMMEDIATELY:  *FEVER GREATER THAN 100.5 F  *CHILLS WITH OR WITHOUT FEVER  NAUSEA AND VOMITING THAT IS NOT CONTROLLED WITH YOUR NAUSEA MEDICATION  *UNUSUAL SHORTNESS OF BREATH  *UNUSUAL BRUISING OR BLEEDING  TENDERNESS IN MOUTH AND THROAT WITH OR WITHOUT PRESENCE OF ULCERS  *URINARY PROBLEMS  *BOWEL PROBLEMS  UNUSUAL RASH Items with * indicate a potential emergency and should be followed up as soon as possible.  One of the nurses will contact you 24 hours after your treatment. Please let the nurse know about any problems that you may have experienced. Feel free to call the clinic you have any questions or concerns. The clinic phone number is [phone].   I have been informed and understand all the instructions given to me. I know to contact the clinic, my physician, or go to the Emergency Department if any problems should occur. I do not have any questions at this time, but understand that I may call the clinic during office hours   should I have any questions or need assistance in obtaining follow up care.    __________________________________________  _____________  __________ Signature of Patient or Authorized Representative            Date                   Time    __________________________________________ Nurse's Signature

## 2011-08-08 NOTE — Telephone Encounter (Signed)
Nothing to schedule for the patient

## 2011-08-09 ENCOUNTER — Ambulatory Visit (HOSPITAL_BASED_OUTPATIENT_CLINIC_OR_DEPARTMENT_OTHER): Payer: BC Managed Care – PPO

## 2011-08-09 VITALS — BP 90/56 | HR 89 | Temp 97.9°F

## 2011-08-09 DIAGNOSIS — C50419 Malignant neoplasm of upper-outer quadrant of unspecified female breast: Secondary | ICD-10-CM

## 2011-08-09 DIAGNOSIS — Z5189 Encounter for other specified aftercare: Secondary | ICD-10-CM

## 2011-08-09 MED ORDER — PEGFILGRASTIM INJECTION 6 MG/0.6ML
6.0000 mg | Freq: Once | SUBCUTANEOUS | Status: AC
  Administered 2011-08-09: 6 mg via SUBCUTANEOUS
  Filled 2011-08-09: qty 0.6

## 2011-08-15 ENCOUNTER — Encounter: Payer: Self-pay | Admitting: Oncology

## 2011-08-15 ENCOUNTER — Ambulatory Visit (HOSPITAL_BASED_OUTPATIENT_CLINIC_OR_DEPARTMENT_OTHER): Payer: BC Managed Care – PPO | Admitting: Oncology

## 2011-08-15 ENCOUNTER — Other Ambulatory Visit (HOSPITAL_BASED_OUTPATIENT_CLINIC_OR_DEPARTMENT_OTHER): Payer: BC Managed Care – PPO | Admitting: Lab

## 2011-08-15 ENCOUNTER — Telehealth: Payer: Self-pay | Admitting: Oncology

## 2011-08-15 VITALS — BP 112/68 | HR 101 | Temp 98.1°F | Resp 20 | Ht 68.5 in | Wt 129.8 lb

## 2011-08-15 DIAGNOSIS — C773 Secondary and unspecified malignant neoplasm of axilla and upper limb lymph nodes: Secondary | ICD-10-CM

## 2011-08-15 DIAGNOSIS — C50419 Malignant neoplasm of upper-outer quadrant of unspecified female breast: Secondary | ICD-10-CM

## 2011-08-15 DIAGNOSIS — Z170 Estrogen receptor positive status [ER+]: Secondary | ICD-10-CM

## 2011-08-15 DIAGNOSIS — D696 Thrombocytopenia, unspecified: Secondary | ICD-10-CM

## 2011-08-15 LAB — CBC WITH DIFFERENTIAL/PLATELET
Basophils Absolute: 0 10*3/uL (ref 0.0–0.1)
Eosinophils Absolute: 0.1 10*3/uL (ref 0.0–0.5)
HCT: 34 % — ABNORMAL LOW (ref 34.8–46.6)
LYMPH%: 11.2 % — ABNORMAL LOW (ref 14.0–49.7)
MONO#: 1 10*3/uL — ABNORMAL HIGH (ref 0.1–0.9)
NEUT#: 6.8 10*3/uL — ABNORMAL HIGH (ref 1.5–6.5)
NEUT%: 76.4 % (ref 38.4–76.8)
Platelets: 129 10*3/uL — ABNORMAL LOW (ref 145–400)
WBC: 8.9 10*3/uL (ref 3.9–10.3)

## 2011-08-15 LAB — COMPREHENSIVE METABOLIC PANEL
CO2: 30 mEq/L (ref 19–32)
Creatinine, Ser: 0.77 mg/dL (ref 0.50–1.10)
Glucose, Bld: 89 mg/dL (ref 70–99)
Sodium: 140 mEq/L (ref 135–145)
Total Bilirubin: 0.3 mg/dL (ref 0.3–1.2)
Total Protein: 6.9 g/dL (ref 6.0–8.3)

## 2011-08-15 NOTE — Patient Instructions (Addendum)
Doing well  I will see you back in 2 weeks for cycle 4 of TC

## 2011-08-15 NOTE — Progress Notes (Signed)
OFFICE PROGRESS NOTE  CC  Carolyn [name],[name], MD [address] [place] [address] Dr. Cyndia Bent Dr. 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 She had an ultrasound that revealed an area ofconcern measuring 2.9 cm. She also was found to have a right axillary lymph node that was suspicious for malignancy. Patient went on to have biopsy of the calcifications that showed high-grade ductal carcinoma in situ. Biopsy of the right axillary lymph node revealed an invasive ductal carcinoma. The tumor was ER positive PR positive HER-2/neu negative with a Ki-67 of 14%.    #2 on MRI patient was also found to have other areas of concern in the contralateral breast. Also was noted that in the right breast the tumor abutted the chest wall.since then patient has gone on to have biopsies performed at the other areas that are negative for a malignancy. The pathology  is shown below.  #3 patient was begun on neoadjuvant chemotherapy consisting of Taxotere and Cytoxan on 06/27/2011. A total of 4 cycles is planned.  CURRENT THERAPY: S/P cycle 3 of TC with day 2 Neulasta.  INTERVAL HISTORY: Carolyn [name] 41 y.o. female returns for followup visit. Clinically she seems to be doing well her platelets have dropped a little bit to 129,000 but she has no evidence of bleeding. She had one episode of nausea but that is now subsided. She denies any fevers chills night sweats headaches shortness of breath chest pains palpitations. She has noted a libido changes in her nail beds they are darkening. But  her nails continue to grow. She has no rashes she does not complain of dryness of her skin. She has no peripheral paresthesias. No vaginal bleeding or dryness. Her Port-A-Cath site looks good. Remainder of the 10 point review of systems is negative.  MEDICAL HISTORY: Past Medical History  Diagnosis Date  . Breast cancer     ALLERGIES:  is allergic to allegra and shellfish allergy.  MEDICATIONS:  Current Outpatient Prescriptions  Medication Sig Dispense Refill  . acetaminophen (TYLENOL) 500 MG tablet Take 1,000 mg by mouth every 6 (six) hours as needed.      . Cholecalciferol (VITAMIN D3) 2000 UNITS TABS Take 2,000 Units by mouth daily.      Marland Kitchen dexamethasone (DECADRON) 4 MG tablet Take 1 tablet (4 mg total) by mouth 2 (two) times daily with a meal. Two tablets twice a day the day before chemotherapy.  Two tablets twice a day for 3 days after chemotherapy  30 tablet  0  . lidocaine-prilocaine (EMLA) cream Apply topically as needed. Apply to port area 45 mins prior to treatment.  30 g  5  . lidocaine-prilocaine (EMLA) cream Apply topically as needed.  30 g  6  . Loratadine 10 MG CAPS Take 10 mg by mouth daily.      . Multiple Vitamin (MULTIVITAMIN) tablet Take 1 tablet by mouth daily.        SURGICAL HISTORY:  Past Surgical History  Procedure Date  . Wisdom tooth extraction   . Breast biopsy     Left  . Portacath placement 06/20/2011    Procedure:  INSERTION PORT-A-CATH;  Surgeon: [name], MD;  Location: [facility] OR;  Service: General;  Laterality: N/A;    REVIEW OF SYSTEMS:  Pertinent items are noted in HPI.   PHYSICAL EXAMINATION: General appearance: alert, cooperative and appears stated age Lymph nodes: Cervical, supraclavicular, and axillary nodes normal. Resp: clear to auscultation bilaterally and normal percussion bilaterally Back: symmetric, no curvature. ROM normal. No CVA tenderness. Cardio: regular rate and rhythm, S1, S2 normal, no murmur, click, rub or gallop GI:  soft, non-tender; bowel sounds normal; no masses,  no organomegaly Extremities: extremities normal, atraumatic, no cyanosis or edema Neurologic: Grossly normal Breast examination right breast reveals a palpable mass Which is smaller than previous examination,  left breast no masses ECOG PERFORMANCE STATUS: 0 - Asymptomatic  Blood pressure 112/68, pulse 101, temperature 98.1 F (36.7 C), resp. rate 20, height 5' 8.5" (1.74 m), weight 129 lb 12.8 oz (58.877 kg).  LABORATORY DATA: Lab Results  Component Value Date   WBC 8.9 08/15/2011   HGB 11.5* 08/15/2011   HCT 34.0* 08/15/2011   MCV 90.1 08/15/2011   PLT 129* 08/15/2011      Chemistry      Component Value Date/Time   NA 139 08/08/2011 0842   K 4.0 08/08/2011 0842   CL 108 08/08/2011 0842   CO2 24 08/08/2011 0842   BUN 14 08/08/2011 0842   CREATININE 0.72 08/08/2011 0842      Component Value Date/Time   CALCIUM 9.6 08/08/2011 0842   ALKPHOS 103 08/08/2011 0842   AST 41* 08/08/2011 0842   ALT 71* 08/08/2011 0842   BILITOT 0.3 08/08/2011 0842    FINAL DIAGNOSIS Diagnosis Breast, left, needle core biopsy, central - 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 - THERE IS NO EVIDENCE OF MALIGNANCY. - SEE COMMENT. Microscopic Comment The results were called to [facility] on 06/13/2011. (JK:mw 06/13/11 Pecola Leisure MD Pathologist, Electronic Signature (Case signed 06/13/2011) Specimen Gross and Clinical Information Specimen Comment Known right cancer. Linear enhancement w/ MRI central / posterior left breast Specimen(s) Obtained: Breast, left, needle core biopsy, central Specimen Clinical Information R/O DCIS Gross Received in formalin are six soft to semifirm white tan tissue cores which measure 3.0 x 0.4 x 0.4 cm up to 4.2 x 0.3 x 0.3 cm. The specimen is entirely submitted in two blocks. Block summary: A- 3 cores B- 3 cores and remaining fragments (TB:mw 06-12-11) Report signed out from the  following location(s) [facility] 1 of 2 FINAL for Rowser, Roe C [phone]) Report signed out from the following location(s)(continued) [address],[place] [address].CLIA:[id number],CAP:[id number]., [facility] [address] [place], [place], [place] [address]. CLIA #: [id number], 2 of  FINAL DIAGNOSIS Diagnosis Breast, right, needle core biopsy, mass, 12:30 o'clock, upper - FIBROADENOMA. - THERE IS NO EVIDENCE OF MALIGNANCY. - SEE COMMENT. Microscopic Comment The results were called to [facility] on 06/09/2011. Pecola Leisure MD Pathologist, Electronic Signature  ADDITIONAL INFORMATION: 1. PROGNOSTIC INDICATORS - ACIS Results IMMUNOHISTOCHEMICAL AND MORPHOMETRIC ANALYSIS BY THE AUTOMATED CELLULAR IMAGING SYSTEM (ACIS) Estrogen Receptor (Negative, <1%): 100%, STRONG STAINING INTENSITY Progesterone Receptor (Negative, <1%): 87%, STRONG STAINING INTENSITY Proliferation Marker Ki67 by M IB-1 (Low<20%): 14% All controls stained appropriately Pecola Leisure MD Pathologist, Electronic Signature ( Signed 06/02/2011) 1. 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 THE RATIO OF HER-2: CEP 17 SIGNALS WAS 1.41. Reference range: Ratio: HER2:CEP17 < 1.8 - gene amplification not  observed Ratio: HER2:CEP 17 1.8-2.2 - equivocal result Ratio: HER2:CEP17 > 2.2 - gene amplification observed Pecola Leisure MD Pathologist, Electronic Signature ( Signed 06/01/2011) 1 of 3 FINAL for Shular, Xylia [phone]) FINAL DIAGNOSIS Diagnosis 1. Breast, right, needle core biopsy, 9 o'clock - HIGH GRADE DUCTAL CARCINOMA IN SITU - SEE COMMENT. 2. Lymph node, biopsy, Right axilla - POSITIVE FOR DUCTAL CARCINOMA. - SEE COMMENT. 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 Patient presented with  calcifications in the right breast with right axillary lymph node that was positive for invasive ductal carcinoma. The area of concern in the right buttocks the underlying muscle and therefore patient is being offered neoadjuvant chemotherapy. Patient and I have discussed treatment with Taxotere and Cytoxan. Plan is to get give her about 6 cycles of treatment.  #2 once patient completes her treatment she will then go on to get her surgery performed. Because tumor is ER positive she will eventually need antiestrogen therapy. There will be tamoxifen.  #3 patient has had genetic counseling and testing performed and results of which are pending at this time   PLAN:   #1.Overall patient tolerated cycle 3 of her chemotherapy very well. Thrombocytopenia monitor her.  #2 patient will return in 2 weeks' time for cycle 4 of her treatments.  All questions were answered. The patient knows to call the clinic with any problems, questions or concerns. We can certainly see the patient much sooner if necessary.  I spent 25 minutes counseling the patient face to face. The total time spent in the appointment was 30 minutes.    Drue Second, MD Medical/Oncology [facility] [phone] (beeper) [phone] (Office)  08/15/2011, 9:29 AM

## 2011-08-15 NOTE — Telephone Encounter (Signed)
gve the pt her sept,oct 2013 appt calendar. Pt is aware that her chemo appts will be added. gve melissa the copy of the onc tx to add

## 2011-08-22 ENCOUNTER — Telehealth: Payer: Self-pay | Admitting: *Deleted

## 2011-08-22 NOTE — Telephone Encounter (Signed)
Per POF I have scheduled appts. JMW  

## 2011-08-29 ENCOUNTER — Telehealth: Payer: Self-pay | Admitting: *Deleted

## 2011-08-29 ENCOUNTER — Encounter: Payer: Self-pay | Admitting: Oncology

## 2011-08-29 ENCOUNTER — Other Ambulatory Visit (HOSPITAL_BASED_OUTPATIENT_CLINIC_OR_DEPARTMENT_OTHER): Payer: BC Managed Care – PPO | Admitting: Lab

## 2011-08-29 ENCOUNTER — Ambulatory Visit (HOSPITAL_BASED_OUTPATIENT_CLINIC_OR_DEPARTMENT_OTHER): Payer: BC Managed Care – PPO

## 2011-08-29 ENCOUNTER — Ambulatory Visit (HOSPITAL_BASED_OUTPATIENT_CLINIC_OR_DEPARTMENT_OTHER): Payer: BC Managed Care – PPO | Admitting: Oncology

## 2011-08-29 VITALS — BP 109/72 | HR 86 | Temp 98.6°F | Resp 20 | Ht 68.5 in | Wt 132.9 lb

## 2011-08-29 DIAGNOSIS — C50419 Malignant neoplasm of upper-outer quadrant of unspecified female breast: Secondary | ICD-10-CM

## 2011-08-29 DIAGNOSIS — C773 Secondary and unspecified malignant neoplasm of axilla and upper limb lymph nodes: Secondary | ICD-10-CM

## 2011-08-29 DIAGNOSIS — Z170 Estrogen receptor positive status [ER+]: Secondary | ICD-10-CM

## 2011-08-29 DIAGNOSIS — Z5111 Encounter for antineoplastic chemotherapy: Secondary | ICD-10-CM

## 2011-08-29 LAB — COMPREHENSIVE METABOLIC PANEL
BUN: 13 mg/dL (ref 6–23)
CO2: 26 mEq/L (ref 19–32)
Calcium: 10 mg/dL (ref 8.4–10.5)
Chloride: 103 mEq/L (ref 96–112)
Creatinine, Ser: 0.67 mg/dL (ref 0.50–1.10)
Glucose, Bld: 91 mg/dL (ref 70–99)

## 2011-08-29 LAB — CBC WITH DIFFERENTIAL/PLATELET
Basophils Absolute: 0 10*3/uL (ref 0.0–0.1)
HCT: 35.5 % (ref 34.8–46.6)
HGB: 11.9 g/dL (ref 11.6–15.9)
LYMPH%: 6.1 % — ABNORMAL LOW (ref 14.0–49.7)
MONO#: 0.5 10*3/uL (ref 0.1–0.9)
NEUT%: 87.8 % — ABNORMAL HIGH (ref 38.4–76.8)
Platelets: 182 10*3/uL (ref 145–400)
WBC: 8.9 10*3/uL (ref 3.9–10.3)
lymph#: 0.5 10*3/uL — ABNORMAL LOW (ref 0.9–3.3)

## 2011-08-29 MED ORDER — SODIUM CHLORIDE 0.9 % IV SOLN
Freq: Once | INTRAVENOUS | Status: AC
  Administered 2011-08-29: 10:00:00 via INTRAVENOUS

## 2011-08-29 MED ORDER — DOCETAXEL CHEMO INJECTION 160 MG/16ML
75.0000 mg/m2 | Freq: Once | INTRAVENOUS | Status: AC
  Administered 2011-08-29 (×2): 120 mg via INTRAVENOUS
  Filled 2011-08-29: qty 12

## 2011-08-29 MED ORDER — DEXAMETHASONE SODIUM PHOSPHATE 4 MG/ML IJ SOLN
20.0000 mg | Freq: Once | INTRAMUSCULAR | Status: AC
  Administered 2011-08-29: 20 mg via INTRAVENOUS

## 2011-08-29 MED ORDER — SODIUM CHLORIDE 0.9 % IV SOLN
600.0000 mg/m2 | Freq: Once | INTRAVENOUS | Status: AC
  Administered 2011-08-29: 1000 mg via INTRAVENOUS
  Filled 2011-08-29: qty 50

## 2011-08-29 MED ORDER — HEPARIN SOD (PORK) LOCK FLUSH 100 UNIT/ML IV SOLN
500.0000 [IU] | Freq: Once | INTRAVENOUS | Status: AC | PRN
  Administered 2011-08-29: 500 [IU]
  Filled 2011-08-29: qty 5

## 2011-08-29 MED ORDER — SODIUM CHLORIDE 0.9 % IJ SOLN
10.0000 mL | INTRAMUSCULAR | Status: DC | PRN
  Administered 2011-08-29: 10 mL
  Filled 2011-08-29: qty 10

## 2011-08-29 MED ORDER — ONDANSETRON 16 MG/50ML IVPB (CHCC)
16.0000 mg | Freq: Once | INTRAVENOUS | Status: AC
  Administered 2011-08-29: 16 mg via INTRAVENOUS

## 2011-08-29 NOTE — Patient Instructions (Addendum)
[facility] Discharge Instructions for Patients Receiving Chemotherapy  Today you received the following chemotherapy agents Taxotere and Cytoxan.  To help prevent nausea and vomiting after your treatment, we encourage you to take your nausea medication as prescribed.   If you develop nausea and vomiting that is not controlled by your nausea medication, call the clinic. If it is after clinic hours your family physician or the after hours number for the clinic or go to the Emergency Department.   BELOW ARE SYMPTOMS THAT SHOULD BE REPORTED IMMEDIATELY:  *FEVER GREATER THAN 100.5 F  *CHILLS WITH OR WITHOUT FEVER  NAUSEA AND VOMITING THAT IS NOT CONTROLLED WITH YOUR NAUSEA MEDICATION  *UNUSUAL SHORTNESS OF BREATH  *UNUSUAL BRUISING OR BLEEDING  TENDERNESS IN MOUTH AND THROAT WITH OR WITHOUT PRESENCE OF ULCERS  *URINARY PROBLEMS  *BOWEL PROBLEMS  UNUSUAL RASH Items with * indicate a potential emergency and should be followed up as soon as possible.  One of the nurses will contact you 24 hours after your treatment. Please let the nurse know about any problems that you may have experienced. Feel free to call the clinic you have any questions or concerns. The clinic phone number is [phone].   I have been informed and understand all the instructions given to me. I know to contact the clinic, my physician, or go to the Emergency Department if any problems should occur. I do not have any questions at this time, but understand that I may call the clinic during office hours   should I have any questions or need assistance in obtaining follow up care.    __________________________________________  _____________  __________ Signature of Patient or Authorized Representative            Date                   Time    __________________________________________ Nurse's Signature    

## 2011-08-29 NOTE — Patient Instructions (Addendum)
I will see you back in 1 week  MRI of Bilateral breasts as soon possible

## 2011-08-29 NOTE — Telephone Encounter (Signed)
Gave patient appointment for mri of the breast on 09-04-2011 arrival 6:45pm

## 2011-08-30 ENCOUNTER — Ambulatory Visit (HOSPITAL_BASED_OUTPATIENT_CLINIC_OR_DEPARTMENT_OTHER): Payer: BC Managed Care – PPO

## 2011-08-30 VITALS — BP 103/66 | HR 87 | Temp 97.4°F

## 2011-08-30 DIAGNOSIS — Z5189 Encounter for other specified aftercare: Secondary | ICD-10-CM

## 2011-08-30 DIAGNOSIS — C50419 Malignant neoplasm of upper-outer quadrant of unspecified female breast: Secondary | ICD-10-CM

## 2011-08-30 MED ORDER — PEGFILGRASTIM INJECTION 6 MG/0.6ML
6.0000 mg | Freq: Once | SUBCUTANEOUS | Status: AC
  Administered 2011-08-30: 6 mg via SUBCUTANEOUS
  Filled 2011-08-30: qty 0.6

## 2011-08-31 ENCOUNTER — Other Ambulatory Visit (HOSPITAL_COMMUNITY): Payer: BC Managed Care – PPO

## 2011-08-31 ENCOUNTER — Inpatient Hospital Stay (HOSPITAL_COMMUNITY): Admission: RE | Admit: 2011-08-31 | Payer: BC Managed Care – PPO | Source: Ambulatory Visit

## 2011-09-04 ENCOUNTER — Inpatient Hospital Stay (HOSPITAL_COMMUNITY): Admission: RE | Admit: 2011-09-04 | Payer: BC Managed Care – PPO | Source: Ambulatory Visit

## 2011-09-04 ENCOUNTER — Ambulatory Visit (HOSPITAL_COMMUNITY)
Admission: RE | Admit: 2011-09-04 | Discharge: 2011-09-04 | Disposition: A | Discharge status: Home / Self Care | Payer: BC Managed Care – PPO | Source: Ambulatory Visit | Attending: Oncology | Admitting: Oncology

## 2011-09-04 DIAGNOSIS — D249 Benign neoplasm of unspecified breast: Secondary | ICD-10-CM | POA: Insufficient documentation

## 2011-09-04 DIAGNOSIS — C50919 Malignant neoplasm of unspecified site of unspecified female breast: Secondary | ICD-10-CM | POA: Insufficient documentation

## 2011-09-04 MED ORDER — GADOBENATE DIMEGLUMINE 529 MG/ML IV SOLN
12.0000 mL | Freq: Once | INTRAVENOUS | Status: AC | PRN
  Administered 2011-09-04: 12 mL via INTRAVENOUS

## 2011-09-04 NOTE — Progress Notes (Signed)
OFFICE PROGRESS NOTE  CC  [name],[name], MD [address], [address] [place] [address] Dr. Cyndia Bent Dr. 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 She had an ultrasound that revealed an area ofconcern measuring 2.9 cm. She also was found to have a right axillary lymph node that was suspicious for malignancy. Patient went on to have biopsy of the calcifications that showed high-grade ductal carcinoma in situ. Biopsy of the right axillary lymph node revealed an invasive ductal carcinoma. The tumor was ER positive PR positive HER-2/neu negative with a Ki-67 of 14%.    #2 on MRI patient was also found to have other areas of concern in the contralateral breast. Also was noted that in the right breast the tumor abutted the chest wall.since then patient has gone on to have biopsies performed at the other areas that are negative for a malignancy. The pathology  is shown below.  #3 patient was begun on neoadjuvant chemotherapy consisting of Taxotere and Cytoxan on 06/27/2011. A total of 4 cycles is planned.  CURRENT THERAPY: cycle 4 of TC with day 2 Neulasta.  INTERVAL HISTORY: Carolyn [name] 41 y.o. female returns for followup visit. Clinically she seems to be doing well.She had one episode of nausea but that is now subsided. She denies any fevers chills night sweats headaches shortness of breath chest pains palpitations. She has noted a libido changes in her nail beds they are darkening. But her nails continue to grow. She has no rashes she does not complain of dryness of her skin.  She has no peripheral paresthesias. No vaginal bleeding or dryness. Her Port-A-Cath site looks good. Remainder of the 10 point review of systems is negative.  MEDICAL HISTORY: Past Medical History  Diagnosis Date  . Breast cancer     ALLERGIES:  is allergic to allegra and shellfish allergy.  MEDICATIONS:  Current Outpatient Prescriptions  Medication Sig Dispense Refill  . acetaminophen (TYLENOL) 500 MG tablet Take 1,000 mg by mouth every 6 (six) hours as needed.      . Cholecalciferol (VITAMIN D3) 2000 UNITS TABS Take 2,000 Units by mouth daily.      Marland Kitchen dexamethasone (DECADRON) 4 MG tablet Take 1 tablet (4 mg total) by mouth 2 (two) times daily with a meal. Two tablets twice a day the day before chemotherapy.  Two tablets twice a day for 3 days after chemotherapy  30 tablet  0  . lidocaine-prilocaine (EMLA) cream Apply topically as needed. Apply to port area 45 mins prior to treatment.  30 g  5  . lidocaine-prilocaine (EMLA) cream Apply topically as needed.  30 g  6  . Loratadine 10 MG CAPS Take 10 mg by mouth daily.      . Multiple Vitamin (MULTIVITAMIN) tablet Take 1 tablet by mouth daily.       No current facility-administered medications for this visit.   Facility-Administered Medications Ordered in Other Visits  Medication Dose Route Frequency Provider Last Rate Last Dose  . gadobenate dimeglumine (MULTIHANCE) injection 12 mL  12 mL Intravenous Once PRN Medication Radiologist, MD   12 mL at 09/04/11 1952  SURGICAL HISTORY:  Past Surgical History  Procedure Date  . Wisdom tooth extraction   . Breast biopsy     Left  . Portacath placement 06/20/2011    Procedure: INSERTION PORT-A-CATH;  Surgeon: [name], MD;  Location: [facility] OR;  Service: General;  Laterality: N/A;    REVIEW OF SYSTEMS:  Pertinent items are noted in HPI.   PHYSICAL EXAMINATION: General appearance: alert, cooperative and appears stated age Lymph nodes: Cervical, supraclavicular, and axillary nodes  normal. Resp: clear to auscultation bilaterally and normal percussion bilaterally Back: symmetric, no curvature. ROM normal. No CVA tenderness. Cardio: regular rate and rhythm, S1, S2 normal, no murmur, click, rub or gallop GI: soft, non-tender; bowel sounds normal; no masses,  no organomegaly Extremities: extremities normal, atraumatic, no cyanosis or edema Neurologic: Grossly normal Breast examination right breast reveals a palpable mass Which is smaller than previous examination,  left breast no masses ECOG PERFORMANCE STATUS: 0 - Asymptomatic  Blood pressure 109/72, pulse 86, temperature 98.6 F (37 C), resp. rate 20, height 5' 8.5" (1.74 m), weight 132 lb 14.4 oz (60.283 kg).  LABORATORY DATA: Lab Results  Component Value Date   WBC 8.9 08/29/2011   HGB 11.9 08/29/2011   HCT 35.5 08/29/2011   MCV 90.7 08/29/2011   PLT 182 08/29/2011      Chemistry      Component Value Date/Time   NA 141 08/29/2011 0835   K 3.9 08/29/2011 0835   CL 103 08/29/2011 0835   CO2 26 08/29/2011 0835   BUN 13 08/29/2011 0835   CREATININE 0.67 08/29/2011 0835      Component Value Date/Time   CALCIUM 10.0 08/29/2011 0835   ALKPHOS 100 08/29/2011 0835   AST 22 08/29/2011 0835   ALT 35 08/29/2011 0835   BILITOT 0.3 08/29/2011 0835    FINAL DIAGNOSIS Diagnosis Breast, left, needle core biopsy, central - 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 - THERE IS NO EVIDENCE OF MALIGNANCY. - SEE COMMENT. Microscopic Comment The results were called to [facility] on 06/13/2011. (JK:mw 06/13/11 Pecola Leisure MD Pathologist, Electronic Signature (Case signed 06/13/2011) Specimen Gross and Clinical Information Specimen Comment Known right cancer. Linear enhancement w/ MRI central / posterior left breast Specimen(s) Obtained: Breast, left, needle core biopsy, central Specimen Clinical Information R/O DCIS Gross Received in formalin are six soft to semifirm white tan  tissue cores which measure 3.0 x 0.4 x 0.4 cm up to 4.2 x 0.3 x 0.3 cm. The specimen is entirely submitted in two blocks. Block summary: A- 3 cores B- 3 cores and remaining fragments (TB:mw 06-12-11) Report signed out from the following location(s) [facility] 1 of 2 FINAL for Lastinger, Shanin C [phone]) Report signed out from the following location(s)(continued) [address],[place] [address].CLIA:[id number],CAP:[id number]., [facility] [address] [place], [place], [place] [address]. CLIA #: [id number], 2 of  FINAL DIAGNOSIS Diagnosis Breast, right, needle core biopsy, mass, 12:30 o'clock, upper - FIBROADENOMA. - THERE IS NO EVIDENCE OF MALIGNANCY. - SEE COMMENT. Microscopic Comment The results were called to [facility] on 06/09/2011. Pecola Leisure MD Pathologist, Electronic Signature  ADDITIONAL INFORMATION: 1. PROGNOSTIC INDICATORS - ACIS Results IMMUNOHISTOCHEMICAL AND MORPHOMETRIC ANALYSIS BY THE AUTOMATED CELLULAR IMAGING SYSTEM (ACIS) Estrogen Receptor (Negative, <1%): 100%, STRONG STAINING INTENSITY Progesterone Receptor (Negative, <1%): 87%, STRONG STAINING INTENSITY Proliferation Marker Ki67 by M IB-1 (Low<20%): 14% All controls stained appropriately Pecola Leisure MD Pathologist, Electronic Signature ( Signed 06/02/2011)  1. 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 THE RATIO OF HER-2: CEP 17 SIGNALS WAS 1.41. Reference range: Ratio: HER2:CEP17 < 1.8 - gene amplification not observed Ratio: HER2:CEP 17 1.8-2.2 - equivocal result Ratio: HER2:CEP17 > 2.2 - gene amplification observed Pecola Leisure MD Pathologist, Electronic Signature ( Signed 06/01/2011) 1 of 3 FINAL for Pasquarello, Destyni ([phone]) FINAL DIAGNOSIS Diagnosis 1. Breast, right, needle core biopsy, 9 o'clock - HIGH GRADE DUCTAL CARCINOMA IN SITU - SEE COMMENT. 2. Lymph node, biopsy,  Right axilla - POSITIVE FOR DUCTAL CARCINOMA. - SEE COMMENT. 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 Patient presented with calcifications in the right breast with right axillary lymph node that was positive for invasive ductal carcinoma. The area of concern in the right buttocks the underlying muscle and therefore patient is being offered neoadjuvant chemotherapy. Patient and I have discussed treatment with Taxotere and Cytoxan. Plan is to get give her about 6 cycles of treatment.  #2 once patient completes her treatment she will then go on to get her surgery performed. Because tumor is ER positive she will eventually need antiestrogen therapy. There will be tamoxifen.  #3 patient has had genetic counseling and testing performed and results of which are pending at this time   PLAN:   #1.Proceed with cycle 4 of chemotherapy today (8/20)  #2 needs MRI of breast for re-evaluation  #3 patient will return in 1 weeks' time for follow up  All questions were answered. The patient knows to call the clinic with any problems, questions or concerns. We can certainly see the patient much sooner if necessary.  I spent 25 minutes counseling the patient face to face. The total time spent in the appointment was 30 minutes.    Drue Second, MD Medical/Oncology [facility] [phone] (beeper) [phone] (Office)

## 2011-09-05 ENCOUNTER — Ambulatory Visit (HOSPITAL_BASED_OUTPATIENT_CLINIC_OR_DEPARTMENT_OTHER): Payer: BC Managed Care – PPO | Admitting: Oncology

## 2011-09-05 ENCOUNTER — Encounter: Payer: Self-pay | Admitting: Oncology

## 2011-09-05 ENCOUNTER — Other Ambulatory Visit (HOSPITAL_BASED_OUTPATIENT_CLINIC_OR_DEPARTMENT_OTHER): Payer: BC Managed Care – PPO | Admitting: Lab

## 2011-09-05 ENCOUNTER — Telehealth: Payer: Self-pay | Admitting: *Deleted

## 2011-09-05 VITALS — BP 106/68 | HR 102 | Temp 99.1°F | Resp 20 | Ht 68.5 in | Wt 135.3 lb

## 2011-09-05 DIAGNOSIS — C50419 Malignant neoplasm of upper-outer quadrant of unspecified female breast: Secondary | ICD-10-CM

## 2011-09-05 DIAGNOSIS — C50919 Malignant neoplasm of unspecified site of unspecified female breast: Secondary | ICD-10-CM

## 2011-09-05 DIAGNOSIS — C773 Secondary and unspecified malignant neoplasm of axilla and upper limb lymph nodes: Secondary | ICD-10-CM

## 2011-09-05 DIAGNOSIS — Z170 Estrogen receptor positive status [ER+]: Secondary | ICD-10-CM

## 2011-09-05 LAB — CBC WITH DIFFERENTIAL/PLATELET
Basophils Absolute: 0 10*3/uL (ref 0.0–0.1)
Eosinophils Absolute: 0 10*3/uL (ref 0.0–0.5)
HCT: 33.9 % — ABNORMAL LOW (ref 34.8–46.6)
LYMPH%: 13 % — ABNORMAL LOW (ref 14.0–49.7)
MONO#: 1.3 10*3/uL — ABNORMAL HIGH (ref 0.1–0.9)
NEUT#: 6.6 10*3/uL — ABNORMAL HIGH (ref 1.5–6.5)
NEUT%: 72.3 % (ref 38.4–76.8)
Platelets: 97 10*3/uL — ABNORMAL LOW (ref 145–400)
WBC: 9.1 10*3/uL (ref 3.9–10.3)

## 2011-09-05 LAB — COMPREHENSIVE METABOLIC PANEL (CC13)
AST: 49 U/L — ABNORMAL HIGH (ref 5–34)
Albumin: 3.6 g/dL (ref 3.5–5.0)
BUN: 10 mg/dL (ref 7.0–26.0)
CO2: 32 mEq/L — ABNORMAL HIGH (ref 22–29)
Calcium: 9.4 mg/dL (ref 8.4–10.4)
Chloride: 98 mEq/L (ref 98–107)
Creatinine: 0.9 mg/dL (ref 0.6–1.1)
Glucose: 120 mg/dl — ABNORMAL HIGH (ref 70–99)
Potassium: 3.9 mEq/L (ref 3.5–5.1)

## 2011-09-05 NOTE — Patient Instructions (Addendum)
Proceed with Dr. Jamey Ripa consultation  I will see you back in 1 month after your surgery

## 2011-09-05 NOTE — Telephone Encounter (Signed)
Gave patient appointment for 10-06-2011 starting at 9:00am

## 2011-09-05 NOTE — Progress Notes (Signed)
OFFICE PROGRESS NOTE  CC  Carolyn [name],[name], MD [address] [place] [address] Dr. Cyndia Bent Dr. 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 She had an ultrasound that revealed an area ofconcern measuring 2.9 cm. She also was found to have a right axillary lymph node that was suspicious for malignancy. Patient went on to have biopsy of the calcifications that showed high-grade ductal carcinoma in situ. Biopsy of the right axillary lymph node revealed an invasive ductal carcinoma. The tumor was ER positive PR positive HER-2/neu negative with a Ki-67 of 14%.    #2 on MRI patient was also found to have other areas of concern in the contralateral breast. Also was noted that in the right breast the tumor abutted the chest wall.since then patient has gone on to have biopsies performed at the other areas that are negative for a malignancy. The pathology  is shown below.  #3 patient was begun on neoadjuvant chemotherapy consisting of Taxotere and Cytoxan on 06/27/2011. A total of 4 cycles is planned.  CURRENT THERAPY:s/p cycle 4 of TC with day 2 Neulasta.  INTERVAL HISTORY: Carolyn [name] 41 y.o. female returns for followup visit. Patient is now is status post 4 cycles of Taxotere and Cytoxan last week overall she is doing well she has no residual effects from the chemotherapy. She also has had MRI of the breasts performed which does reveal reduction in the size of the disease. I went over the MRI results with her today. She denies any nausea vomiting fevers chills night  sweats. Certainly she is now ready for her surgery in the next few weeks.  MEDICAL HISTORY: Past Medical History  Diagnosis Date  . Breast cancer     ALLERGIES:  is allergic to allegra and shellfish allergy.  MEDICATIONS:  Current Outpatient Prescriptions  Medication Sig Dispense Refill  . acetaminophen (TYLENOL) 500 MG tablet Take 1,000 mg by mouth every 6 (six) hours as needed.      . Cholecalciferol (VITAMIN D3) 2000 UNITS TABS Take 2,000 Units by mouth daily.      Marland Kitchen dexamethasone (DECADRON) 4 MG tablet Take 1 tablet (4 mg total) by mouth 2 (two) times daily with a meal. Two tablets twice a day the day before chemotherapy.  Two tablets twice a day for 3 days after chemotherapy  30 tablet  0  . lidocaine-prilocaine (EMLA) cream Apply topically as needed. Apply to port area 45 mins prior to treatment.  30 g  5  . lidocaine-prilocaine (EMLA) cream Apply topically as needed.  30 g  6  . Loratadine 10 MG CAPS Take 10 mg by mouth daily.      . Multiple Vitamin (MULTIVITAMIN) tablet Take 1 tablet by mouth daily.       No current facility-administered medications for this visit.   Facility-Administered Medications Ordered in Other Visits  Medication Dose Route Frequency Provider Last Rate Last Dose  . gadobenate dimeglumine (MULTIHANCE) injection 12 mL  12 mL Intravenous Once PRN Medication Radiologist, MD   12 mL at 09/04/11 1952    SURGICAL HISTORY:  Past Surgical History  Procedure Date  .  Wisdom tooth extraction   . Breast biopsy     Left  . Portacath placement 06/20/2011    Procedure: INSERTION PORT-A-CATH;  Surgeon: [name], MD;  Location[facility] OR;  Service: General;  Laterality: N/A;    REVIEW OF SYSTEMS:  Pertinent items are noted in HPI.   PHYSICAL EXAMINATION: General appearance: alert, cooperative and appears stated age Lymph nodes: Cervical, supraclavicular, and axillary nodes normal. Resp: clear to auscultation bilaterally and normal percussion  bilaterally Back: symmetric, no curvature. ROM normal. No CVA tenderness. Cardio: regular rate and rhythm, S1, S2 normal, no murmur, click, rub or gallop GI: soft, non-tender; bowel sounds normal; no masses,  no organomegaly Extremities: extremities normal, atraumatic, no cyanosis or edema Neurologic: Grossly normal Breast examination right breast reveals a palpable mass Which is smaller than previous examination,  left breast no masses ECOG PERFORMANCE STATUS: 0 - Asymptomatic  Blood pressure 106/68, pulse 102, temperature 99.1 F (37.3 C), temperature source Oral, resp. rate 20, height 5' 8.5" (1.74 m), weight 135 lb 4.8 oz (61.372 kg), last menstrual period 07/05/2011.  LABORATORY DATA: Lab Results  Component Value Date   WBC 9.1 09/05/2011   HGB 11.1* 09/05/2011   HCT 33.9* 09/05/2011   MCV 89.4 09/05/2011   PLT 97* 09/05/2011      Chemistry      Component Value Date/Time   NA 141 08/29/2011 0835   K 3.9 08/29/2011 0835   CL 103 08/29/2011 0835   CO2 26 08/29/2011 0835   BUN 13 08/29/2011 0835   CREATININE 0.67 08/29/2011 0835      Component Value Date/Time   CALCIUM 10.0 08/29/2011 0835   ALKPHOS 100 08/29/2011 0835   AST 22 08/29/2011 0835   ALT 35 08/29/2011 0835   BILITOT 0.3 08/29/2011 0835    FINAL DIAGNOSIS Diagnosis Breast, left, needle core biopsy, central - 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 - THERE IS NO EVIDENCE OF MALIGNANCY. - SEE COMMENT. Microscopic Comment The results were called to [facility] on 06/13/2011. (JK:mw 06/13/11 Pecola Leisure MD Pathologist, Electronic Signature (Case signed 06/13/2011) Specimen Gross and Clinical Information Specimen Comment Known right cancer. Linear enhancement w/ MRI central / posterior left breast Specimen(s) Obtained: Breast, left, needle core biopsy, central Specimen Clinical Information R/O DCIS Gross Received in formalin are six soft to semifirm white tan tissue cores  which measure 3.0 x 0.4 x 0.4 cm up to 4.2 x 0.3 x 0.3 cm. The specimen is entirely submitted in two blocks. Block summary: A- 3 cores B- 3 cores and remaining fragments (TB:mw 06-12-11) Report signed out from the following location(s) [facility] 1 of 2 FINAL for Fearn, Georgeanne C [phone]) Report signed out from the following location(s)(continued) [address],[place] [address].CLIA:[id number],CAP:[id number]., [facility] [address] [place], [place], [place] [address]. CLIA #: [id number], 2 of  FINAL DIAGNOSIS Diagnosis Breast, right, needle core biopsy, mass, 12:30 o'clock, upper - FIBROADENOMA. - THERE IS NO EVIDENCE OF MALIGNANCY. - SEE COMMENT. Microscopic Comment The results were called to [facility] on 06/09/2011. Pecola Leisure MD Pathologist, Electronic Signature  ADDITIONAL INFORMATION: 1. PROGNOSTIC INDICATORS - ACIS Results IMMUNOHISTOCHEMICAL AND MORPHOMETRIC ANALYSIS BY THE AUTOMATED CELLULAR IMAGING SYSTEM (ACIS) Estrogen Receptor (Negative, <1%): 100%, STRONG STAINING INTENSITY Progesterone Receptor (Negative, <1%): 87%, STRONG STAINING INTENSITY Proliferation Marker Ki67 by M IB-1 (Low<20%): 14% All controls stained appropriately Pecola Leisure MD Pathologist, Electronic Signature ( Signed 06/02/2011) 1. CHROMOGENIC IN-SITU HYBRIDIZATION  Interpretation HER-2/NEU BY CISH - NO AMPLIFICATION OF HER-2 DETECTED. THE RATIO OF HER-2: CEP 17 SIGNALS WAS 1.41. Reference range: Ratio: HER2:CEP17 < 1.8 - gene amplification not observed Ratio: HER2:CEP 17 1.8-2.2 - equivocal result Ratio: HER2:CEP17 > 2.2 - gene amplification observed Pecola Leisure MD Pathologist, Electronic Signature ( Signed 06/01/2011) 1 of 3 FINAL for Monestime, [place]ada ([id number]) FINAL DIAGNOSIS Diagnosis 1. Breast, right, needle core biopsy, 9 o'clock - HIGH GRADE DUCTAL CARCINOMA IN SITU - SEE COMMENT. 2. Lymph node, biopsy, Right  axilla - POSITIVE FOR DUCTAL CARCINOMA. - SEE COMMENT. 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 Patient presented with calcifications in the right breast with right axillary lymph node that was positive for invasive ductal carcinoma. The area of concern in the right buttocks the underlying muscle and therefore patient is being offered neoadjuvant chemotherapy. Patient and I have discussed treatment with Taxotere and Cytoxan. Plan is to get give her about 6 cycles of treatment.  #2 once patient completes her treatment she will then go on to get her surgery performed. Because tumor is ER positive she will eventually need antiestrogen therapy. This will be tamoxifen.  PLAN:  #1 patient will proceed with her scheduled surgery I do believe that that is going to be after Labor Day.  #2 I will plan on seeing her back in about 4 weeks' time in followup.  All questions were answered. The patient knows to call the clinic with any problems, questions or concerns. We can certainly see the patient much sooner if necessary.  I spent 15 minutes counseling the patient face to face. The total time spent in the appointment was 30 minutes.    Drue Second, MD Medical/Oncology [facility] [phone] (beeper) [phone] (Office)

## 2011-09-10 DIAGNOSIS — C50919 Malignant neoplasm of unspecified site of unspecified female breast: Secondary | ICD-10-CM

## 2011-09-10 HISTORY — DX: Malignant neoplasm of unspecified site of unspecified female breast: C50.919

## 2011-09-15 ENCOUNTER — Ambulatory Visit (INDEPENDENT_AMBULATORY_CARE_PROVIDER_SITE_OTHER): Payer: BC Managed Care – PPO | Admitting: Surgery

## 2011-09-15 ENCOUNTER — Encounter (INDEPENDENT_AMBULATORY_CARE_PROVIDER_SITE_OTHER): Payer: Self-pay | Admitting: Surgery

## 2011-09-15 VITALS — BP 124/64 | HR 90 | Temp 97.4°F | Resp 18 | Ht 68.0 in | Wt 136.4 lb

## 2011-09-15 DIAGNOSIS — C50419 Malignant neoplasm of upper-outer quadrant of unspecified female breast: Secondary | ICD-10-CM

## 2011-09-15 NOTE — Progress Notes (Signed)
Patient ID: Carolyn [name], female   DOB: 12/22/1970, 41 y.o.   MRN: [id number]  Chief Complaint  Patient presents with  . Pre-op Exam    Rt br    HPI Carolyn [name] is a 41 y.o. female.she has completed her neoadjuvant chemotherapy for her right breast cancer. She apparently was originally scheduled for 6 treatments but has completed 4 and have decided to stop. She comes in to discuss surgical options at this point in time. She is having no breast symptoms and overall feels well. She believes that she has tolerated her chemotherapy well. Past Medical History  Diagnosis Date  . Breast cancer     Past Surgical History  Procedure Date  . Wisdom tooth extraction   . Breast biopsy     Left  . Portacath placement 06/20/2011    Procedure: INSERTION PORT-A-CATH;  Surgeon: Esaias Cleavenger J Takeela Peil, MD;  Location: [facility] OR;  Service: General;  Laterality: N/A;    Family History  Problem Relation Age of Onset  . Breast cancer Maternal Aunt     Social History History  Substance Use Topics  . Smoking status: Never Smoker   . Smokeless tobacco: Never Used  . Alcohol Use: No    Allergies  Allergen Reactions  . Allegra (Fexofenadine) Hives    Abdomen only  . Shellfish Allergy Hives    Abdomen only    Current Outpatient Prescriptions  Medication Sig Dispense Refill  . acetaminophen (TYLENOL) 500 MG tablet Take 1,000 mg by mouth every 6 (six) hours as needed.      . Cholecalciferol (VITAMIN D3) 2000 UNITS TABS Take 2,000 Units by mouth daily.      . dexamethasone (DECADRON) 4 MG tablet Take 1 tablet (4 mg total) by mouth 2 (two) times daily with a meal. Two tablets twice a day the day before chemotherapy.  Two tablets twice a day for 3 days after chemotherapy  30 tablet  0  . lidocaine-prilocaine (EMLA) cream Apply topically as needed. Apply to port area 45 mins prior to treatment.  30 g  5  . lidocaine-prilocaine (EMLA) cream Apply topically as needed.  30 g  6  . Loratadine 10 MG CAPS Take 10  mg by mouth daily.      . Multiple Vitamin (MULTIVITAMIN) tablet Take 1 tablet by mouth daily.        Review of Systems Review of Systems  Constitutional: Negative for fever, chills and unexpected weight change.  HENT: Negative for hearing loss, congestion, sore throat, trouble swallowing and voice change.   Eyes: Negative for visual disturbance.  Respiratory: Negative for cough and wheezing.   Cardiovascular: Negative for chest pain, palpitations and leg swelling.  Gastrointestinal: Negative for nausea, vomiting, abdominal pain, diarrhea, constipation, blood in stool, abdominal distention and anal bleeding.  Genitourinary: Negative for hematuria, vaginal bleeding and difficulty urinating.  Musculoskeletal: Negative for arthralgias.  Skin: Negative for rash and wound.  Neurological: Negative for seizures, syncope and headaches.  Hematological: Negative for adenopathy. Does not bruise/bleed easily.  Psychiatric/Behavioral: Negative for confusion.    Blood pressure 124/64, pulse 90, temperature 97.4 F (36.3 C), temperature source Temporal, resp. rate 18, height 5' 8" (1.727 m), weight 136 lb 6.4 oz (61.871 kg), last menstrual period 07/05/2011, SpO2 98.00%.  Physical Exam VS: BP 124/64  Pulse 90  Temp 97.4 F (36.3 C) (Temporal)  Resp 18  Ht 5' 8" (1.727 m)  Wt 136 lb 6.4 oz (61.871 kg)  BMI 20.74 kg/m2    SpO2 98%  LMP 07/05/2011  Physical Exam  Vitals reviewed. Constitutional: She is oriented to person, place, and time. She appears well-developed and well-nourished. No distress.  HENT:  Head: Normocephalic and atraumatic.  Mouth/Throat: Oropharynx is clear and moist.  Eyes: Conjunctivae and EOM are normal. Pupils are equal, round, and reactive to light. No scleral icterus.  Neck: Normal range of motion. Neck supple. No tracheal deviation present. No thyromegaly present.  Cardiovascular: Normal rate, regular rhythm, normal heart sounds and intact distal pulses.  Exam reveals  no gallop and no friction rub.   No murmur heard. Pulmonary/Chest: Effort normal and breath sounds normal. No respiratory distress. She has no wheezes. She has no rales.   Breasts: The left breast is normal. There is no obvious mass in the right breast with fairly laterally and about the 8:30 position is some nodularity residual from her original diagnosis. The nipple area looks normal.  Lymphatics: There is no axillary or supraclavicular adenopathy noted.  Abdominal: Soft. Bowel sounds are normal. She exhibits no distension and no mass. There is no tenderness. There is no rebound and no guarding.  Musculoskeletal: Normal range of motion. She exhibits no edema and no tenderness.  Neurological: She is alert and oriented to person, place, and time.  Skin: Skin is warm and dry. No rash noted. She is not diaphoretic. No erythema.  Psychiatric: She has a normal mood and affect. Her behavior is normal. Judgment and thought content normal.  Lymphatics: There's a question of a right axillary lymph node present. I did not appreciate any left axillary or supraclavicular adenopathy on either side.  Data Reviewed I have reviewed the interval notes from the medical oncologist. I will also review the recent MRI which shows continued area of 5 cm plus deep in the right breast.  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 The area really has not significantly reduced in size with her chemotherapy although certainly the mass effect has resolved. In addition she has relatively small breasts and I think removing the area of involvement was caused significant deformity.  I discussed this with her and she is agreeable to proceeding. She understands that she will need to be in the hospital, will have drainage postoperatively and a lead slow  recovery. She also knows that she may need post mastectomy radiation.    CC: Dr [name]   Ascher Schroepfer J 09/15/2011, 12:49 PM    

## 2011-09-15 NOTE — Patient Instructions (Signed)
We will schedule surgery - to remove your right breast and the lymph nodes from the armpit area. You  Will need to stay overnight, psooibly two nights depending on how you are feeling after surgery

## 2011-09-19 ENCOUNTER — Ambulatory Visit: Payer: BC Managed Care – PPO

## 2011-09-19 ENCOUNTER — Ambulatory Visit: Payer: BC Managed Care – PPO | Admitting: Oncology

## 2011-09-19 ENCOUNTER — Other Ambulatory Visit: Payer: BC Managed Care – PPO | Admitting: Lab

## 2011-09-20 ENCOUNTER — Ambulatory Visit: Payer: BC Managed Care – PPO

## 2011-09-26 ENCOUNTER — Ambulatory Visit: Payer: BC Managed Care – PPO | Admitting: Oncology

## 2011-09-26 ENCOUNTER — Other Ambulatory Visit: Payer: BC Managed Care – PPO | Admitting: Lab

## 2011-09-28 NOTE — Progress Notes (Signed)
To come in for [facility] labs Bring overnight bag-no rxs to bring

## 2011-09-29 ENCOUNTER — Encounter (HOSPITAL_BASED_OUTPATIENT_CLINIC_OR_DEPARTMENT_OTHER)
Admission: RE | Admit: 2011-09-29 | Discharge: 2011-09-29 | Disposition: A | Discharge status: Home / Self Care | Payer: BC Managed Care – PPO | Source: Ambulatory Visit | Attending: Surgery | Admitting: Surgery

## 2011-09-29 LAB — COMPREHENSIVE METABOLIC PANEL
ALT: 21 U/L (ref 0–35)
AST: 25 U/L (ref 0–37)
Albumin: 4.3 g/dL (ref 3.5–5.2)
Alkaline Phosphatase: 101 U/L (ref 39–117)
CO2: 30 mEq/L (ref 19–32)
Chloride: 101 mEq/L (ref 96–112)
Creatinine, Ser: 0.8 mg/dL (ref 0.50–1.10)
Potassium: 3.8 mEq/L (ref 3.5–5.1)
Sodium: 141 mEq/L (ref 135–145)
Total Bilirubin: 0.3 mg/dL (ref 0.3–1.2)

## 2011-09-29 LAB — CBC WITH DIFFERENTIAL/PLATELET
Basophils Absolute: 0 10*3/uL (ref 0.0–0.1)
Basophils Relative: 0 % (ref 0–1)
Lymphocytes Relative: 33 % (ref 12–46)
MCHC: 33.4 g/dL (ref 30.0–36.0)
Neutro Abs: 2.1 10*3/uL (ref 1.7–7.7)
Neutrophils Relative %: 53 % (ref 43–77)
Platelets: 194 10*3/uL (ref 150–400)
RDW: 14.4 % (ref 11.5–15.5)
WBC: 4 10*3/uL (ref 4.0–10.5)

## 2011-10-03 ENCOUNTER — Encounter (HOSPITAL_BASED_OUTPATIENT_CLINIC_OR_DEPARTMENT_OTHER): Payer: Self-pay | Admitting: *Deleted

## 2011-10-03 ENCOUNTER — Encounter (HOSPITAL_BASED_OUTPATIENT_CLINIC_OR_DEPARTMENT_OTHER): Payer: Self-pay | Admitting: Anesthesiology

## 2011-10-03 ENCOUNTER — Ambulatory Visit (HOSPITAL_BASED_OUTPATIENT_CLINIC_OR_DEPARTMENT_OTHER)
Admission: RE | Admit: 2011-10-03 | Discharge: 2011-10-04 | Disposition: A | Discharge status: Home / Self Care | Payer: BC Managed Care – PPO | Source: Ambulatory Visit | Attending: Surgery | Admitting: Surgery

## 2011-10-03 ENCOUNTER — Encounter (HOSPITAL_BASED_OUTPATIENT_CLINIC_OR_DEPARTMENT_OTHER)
Admission: RE | Disposition: A | Discharge status: Home / Self Care | Payer: Self-pay | Source: Ambulatory Visit | Attending: Surgery

## 2011-10-03 ENCOUNTER — Ambulatory Visit (HOSPITAL_BASED_OUTPATIENT_CLINIC_OR_DEPARTMENT_OTHER): Payer: BC Managed Care – PPO | Admitting: Anesthesiology

## 2011-10-03 DIAGNOSIS — C50419 Malignant neoplasm of upper-outer quadrant of unspecified female breast: Secondary | ICD-10-CM

## 2011-10-03 DIAGNOSIS — D0590 Unspecified type of carcinoma in situ of unspecified breast: Secondary | ICD-10-CM

## 2011-10-03 DIAGNOSIS — C773 Secondary and unspecified malignant neoplasm of axilla and upper limb lymph nodes: Secondary | ICD-10-CM | POA: Insufficient documentation

## 2011-10-03 HISTORY — PX: MODIFIED MASTECTOMY: SHX5268

## 2011-10-03 SURGERY — MODIFIED MASTECTOMY
Anesthesia: General | Site: Breast | Laterality: Right | Wound class: Clean

## 2011-10-03 MED ORDER — HEPARIN SODIUM (PORCINE) 5000 UNIT/ML IJ SOLN: 5000.0000 [IU] | Freq: Three times a day (TID) | INTRAMUSCULAR | Status: DC

## 2011-10-03 MED ORDER — CEFAZOLIN SODIUM 1-5 GM-% IV SOLN
1.0000 g | Freq: Four times a day (QID) | INTRAVENOUS | Status: AC
  Administered 2011-10-03 – 2011-10-04 (×3): 1 g via INTRAVENOUS

## 2011-10-03 MED ORDER — ONDANSETRON HCL 4 MG/2ML IJ SOLN
4.0000 mg | Freq: Four times a day (QID) | INTRAMUSCULAR | Status: DC | PRN
  Administered 2011-10-03: 4 mg via INTRAVENOUS

## 2011-10-03 MED ORDER — DEXAMETHASONE SODIUM PHOSPHATE 4 MG/ML IJ SOLN
INTRAMUSCULAR | Status: DC | PRN
  Administered 2011-10-03: 10 mg via INTRAVENOUS

## 2011-10-03 MED ORDER — MIDAZOLAM HCL 5 MG/5ML IJ SOLN
INTRAMUSCULAR | Status: DC | PRN
  Administered 2011-10-03: 2 mg via INTRAVENOUS

## 2011-10-03 MED ORDER — CEFAZOLIN SODIUM-DEXTROSE 2-3 GM-% IV SOLR: 2.0000 g | INTRAVENOUS | Status: DC

## 2011-10-03 MED ORDER — MIDAZOLAM HCL 2 MG/2ML IJ SOLN: 1.0000 mg | INTRAMUSCULAR | Status: DC | PRN

## 2011-10-03 MED ORDER — PROMETHAZINE HCL 25 MG/ML IJ SOLN
6.2500 mg | INTRAMUSCULAR | Status: DC | PRN
  Administered 2011-10-03: 6.25 mg via INTRAVENOUS

## 2011-10-03 MED ORDER — FENTANYL CITRATE 0.05 MG/ML IJ SOLN
INTRAMUSCULAR | Status: DC | PRN
  Administered 2011-10-03 (×2): 50 ug via INTRAVENOUS
  Administered 2011-10-03: 25 ug via INTRAVENOUS
  Administered 2011-10-03: 50 ug via INTRAVENOUS
  Administered 2011-10-03: 25 ug via INTRAVENOUS
  Administered 2011-10-03: 50 ug via INTRAVENOUS

## 2011-10-03 MED ORDER — DEXTROSE IN LACTATED RINGERS 5 % IV SOLN
INTRAVENOUS | Status: DC
  Administered 2011-10-03: 75 mL/h via INTRAVENOUS

## 2011-10-03 MED ORDER — HYDROMORPHONE HCL PF 1 MG/ML IJ SOLN
0.2500 mg | INTRAMUSCULAR | Status: DC | PRN
  Administered 2011-10-03: 0.5 mg via INTRAVENOUS

## 2011-10-03 MED ORDER — OXYCODONE-ACETAMINOPHEN 5-325 MG PO TABS
1.0000 | ORAL_TABLET | ORAL | Status: DC | PRN
Start: 2011-10-03 — End: 2011-10-04
  Administered 2011-10-03 – 2011-10-04 (×2): 2 via ORAL

## 2011-10-03 MED ORDER — EPHEDRINE SULFATE 50 MG/ML IJ SOLN
INTRAMUSCULAR | Status: DC | PRN
  Administered 2011-10-03 (×2): 5 mg via INTRAVENOUS
  Administered 2011-10-03: 10 mg via INTRAVENOUS
  Administered 2011-10-03: 15 mg via INTRAVENOUS

## 2011-10-03 MED ORDER — LACTATED RINGERS IV SOLN
INTRAVENOUS | Status: DC
  Administered 2011-10-03 (×2): via INTRAVENOUS

## 2011-10-03 MED ORDER — FENTANYL CITRATE 0.05 MG/ML IJ SOLN: 50.0000 ug | Freq: Once | INTRAMUSCULAR | Status: DC

## 2011-10-03 MED ORDER — LIDOCAINE HCL (CARDIAC) 20 MG/ML IV SOLN
INTRAVENOUS | Status: DC | PRN
  Administered 2011-10-03: 75 mg via INTRAVENOUS

## 2011-10-03 MED ORDER — ONDANSETRON HCL 4 MG PO TABS: 4.0000 mg | ORAL_TABLET | Freq: Four times a day (QID) | ORAL | Status: DC | PRN

## 2011-10-03 MED ORDER — BUPIVACAINE HCL (PF) 0.25 % IJ SOLN
INTRAMUSCULAR | Status: DC | PRN
  Administered 2011-10-03: 10 mL

## 2011-10-03 MED ORDER — PROPOFOL 10 MG/ML IV BOLUS
INTRAVENOUS | Status: DC | PRN
  Administered 2011-10-03: 180 mg via INTRAVENOUS

## 2011-10-03 MED ORDER — CEFAZOLIN SODIUM-DEXTROSE 2-3 GM-% IV SOLR
INTRAVENOUS | Status: DC | PRN
  Administered 2011-10-03: 2 g via INTRAVENOUS

## 2011-10-03 MED ORDER — CHLORHEXIDINE GLUCONATE 4 % EX LIQD: 1.0000 "application " | Freq: Once | CUTANEOUS | Status: DC

## 2011-10-03 MED ORDER — HYDROMORPHONE HCL PF 2 MG/ML IJ SOLN: 2.0000 mg | INTRAMUSCULAR | Status: DC | PRN

## 2011-10-03 SURGICAL SUPPLY — 67 items
APPLIER CLIP 11 MED OPEN (CLIP)
APPLIER CLIP 9.375 MED OPEN (MISCELLANEOUS) ×4
BANDAGE ELASTIC 6 VELCRO ST LF (GAUZE/BANDAGES/DRESSINGS) IMPLANT
BINDER BREAST MEDIUM (GAUZE/BANDAGES/DRESSINGS) ×2 IMPLANT
BIOPATCH RED 1 DISK 7.0 (GAUZE/BANDAGES/DRESSINGS) ×4 IMPLANT
BLADE HEX COATED 2.75 (ELECTRODE) ×2 IMPLANT
BLADE SURG 10 STRL SS (BLADE) ×2 IMPLANT
BLADE SURG 15 STRL LF DISP TIS (BLADE) ×1 IMPLANT
BLADE SURG 15 STRL SS (BLADE) ×1
BLADE SURG ROTATE 9660 (MISCELLANEOUS) IMPLANT
CANISTER SUCTION 1200CC (MISCELLANEOUS) ×2 IMPLANT
CHLORAPREP W/TINT 26ML (MISCELLANEOUS) ×2 IMPLANT
CLIP APPLIE 11 MED OPEN (CLIP) IMPLANT
CLIP APPLIE 9.375 MED OPEN (MISCELLANEOUS) ×2 IMPLANT
CLOTH BEACON ORANGE TIMEOUT ST (SAFETY) ×2 IMPLANT
COVER MAYO STAND STRL (DRAPES) ×2 IMPLANT
COVER TABLE BACK 60X90 (DRAPES) ×2 IMPLANT
DECANTER SPIKE VIAL GLASS SM (MISCELLANEOUS) IMPLANT
DERMABOND ADVANCED (GAUZE/BANDAGES/DRESSINGS) ×1
DERMABOND ADVANCED .7 DNX12 (GAUZE/BANDAGES/DRESSINGS) ×1 IMPLANT
DRAIN CHANNEL 19F RND (DRAIN) ×4 IMPLANT
DRAIN HEMOVAC 1/8 X 5 (WOUND CARE) IMPLANT
DRAPE LAPAROSCOPIC ABDOMINAL (DRAPES) ×2 IMPLANT
DRAPE SURG 17X23 STRL (DRAPES) ×2 IMPLANT
DRAPE U-SHAPE 76X120 STRL (DRAPES) IMPLANT
DRAPE UTILITY XL STRL (DRAPES) ×2 IMPLANT
DRSG PAD ABDOMINAL 8X10 ST (GAUZE/BANDAGES/DRESSINGS) ×2 IMPLANT
DRSG TEGADERM 4X4.75 (GAUZE/BANDAGES/DRESSINGS) ×4 IMPLANT
ELECT BLADE 4.0 EZ CLEAN MEGAD (MISCELLANEOUS) ×2
ELECT REM PT RETURN 9FT ADLT (ELECTROSURGICAL) ×2
ELECTRODE BLDE 4.0 EZ CLN MEGD (MISCELLANEOUS) ×1 IMPLANT
ELECTRODE REM PT RTRN 9FT ADLT (ELECTROSURGICAL) ×1 IMPLANT
EVACUATOR SILICONE 100CC (DRAIN) ×4 IMPLANT
GAUZE SPONGE 4X4 12PLY STRL LF (GAUZE/BANDAGES/DRESSINGS) IMPLANT
GLOVE BIO SURGEON STRL SZ 6.5 (GLOVE) ×4 IMPLANT
GLOVE BIOGEL PI IND STRL 6.5 (GLOVE) ×2 IMPLANT
GLOVE BIOGEL PI INDICATOR 6.5 (GLOVE) ×2
GLOVE EUDERMIC 7 POWDERFREE (GLOVE) ×2 IMPLANT
GLOVE SKINSENSE NS SZ7.0 (GLOVE) ×1
GLOVE SKINSENSE STRL SZ7.0 (GLOVE) ×1 IMPLANT
GOWN PREVENTION PLUS XLARGE (GOWN DISPOSABLE) ×8 IMPLANT
NEEDLE HYPO 25X1 1.5 SAFETY (NEEDLE) ×2 IMPLANT
NS IRRIG 1000ML POUR BTL (IV SOLUTION) ×2 IMPLANT
PACK BASIN DAY SURGERY FS (CUSTOM PROCEDURE TRAY) ×2 IMPLANT
PEN SKIN MARKING BROAD TIP (MISCELLANEOUS) ×2 IMPLANT
PENCIL BUTTON HOLSTER BLD 10FT (ELECTRODE) ×2 IMPLANT
PIN SAFETY STERILE (MISCELLANEOUS) ×2 IMPLANT
SHEET MEDIUM DRAPE 40X70 STRL (DRAPES) ×2 IMPLANT
SLEEVE SCD COMPRESS KNEE MED (MISCELLANEOUS) ×2 IMPLANT
SPONGE GAUZE 4X4 12PLY (GAUZE/BANDAGES/DRESSINGS) ×2 IMPLANT
SPONGE INTESTINAL PEANUT (DISPOSABLE) ×2 IMPLANT
SPONGE LAP 18X18 X RAY DECT (DISPOSABLE) ×2 IMPLANT
SPONGE LAP 4X18 X RAY DECT (DISPOSABLE) ×2 IMPLANT
STAPLER VISISTAT 35W (STAPLE) IMPLANT
SUT ETHILON 2 0 FS 18 (SUTURE) ×4 IMPLANT
SUT MNCRL AB 3-0 PS2 18 (SUTURE) ×6 IMPLANT
SUT MNCRL AB 4-0 PS2 18 (SUTURE) ×2 IMPLANT
SUT SILK 2 0 SH (SUTURE) IMPLANT
SUT VIC AB 3-0 SH 27 (SUTURE) ×1
SUT VIC AB 3-0 SH 27X BRD (SUTURE) ×1 IMPLANT
SUT VICRYL 3-0 CR8 SH (SUTURE) ×2 IMPLANT
SYR CONTROL 10ML LL (SYRINGE) ×2 IMPLANT
TOWEL OR 17X24 6PK STRL BLUE (TOWEL DISPOSABLE) ×2 IMPLANT
TOWEL OR NON WOVEN STRL DISP B (DISPOSABLE) ×2 IMPLANT
TUBE CONNECTING 20X1/4 (TUBING) ×2 IMPLANT
WATER STERILE IRR 1000ML POUR (IV SOLUTION) IMPLANT
YANKAUER SUCT BULB TIP NO VENT (SUCTIONS) ×2 IMPLANT

## 2011-10-03 NOTE — Op Note (Signed)
Carolyn [name] July 23, 1970 [id number] 09/15/2011  Preoperative diagnosis: right breast cancer, clinical stage II, status post neoadjuvant chemotherapy  Postoperative diagnosis: same  Procedure: right modified radical mastectomy  Surgeon: [name]   Anesthesia:General  Clinical History and Indications:The patient is seen in the holding area and we reviewed the plans for the procedure as noted above. We reviewed the risks and complications a final time. She had no further questions. I marked theright side as the operative side.  Description of Procedure: The patient was taken to the operating room. After satisfactory general anesthesia was obtained the timeout was done.   I outlined an elliptical incision and marked the inframammary fold and midline. The incision was made. The usual skin flaps were raised. I went to the clavicle superiorly and sternum medially and towards the latissimus laterally.    The inferior flap was then made going to the inframammary fold and out to the latissimus. The breast was then removed from the pectoralis starting medially and working laterally. When I got to the lateral aspect of the pectoralis major muscle I opened the clavipectoral fascia. I finished removing the breast from the serratus muscles.  I then opened the clavipectoral fascia and entered the axilla axillary dissection. Axillary contents were swept out from medial to lateral superior to inferior. The long thoracic and thoracodorsal nerves were both identified and preserved. Stay below the axillary vein. The second or costal nerve was taken. Once the axillary contents were freed the breast was disconnected the remaining breast a patchto the anterior edge of the latissimus and the axillary subcutaneous tissue. I spent several minutes irrigating and making sure everything was dry. I then placed a 76 [place] Blake drain through a small incision in the inferior flap and laid along the pectoralis muscle.  It was secured with a 2-0 nylon suture.a second drain was placed into the axilla and again secured with a 2-0 nylon.  Another irrigation and check for hemostasis was made. Everything appeared to be dry. The incision was closed with interrupted 3-0 Monocryl subcuticular, Dermabond and sterile dressings.The patient tolerated the procedure well. There were no operative complications. All counts were correct. Estimated blood loss wa sminimal. Sterile dressings were applied and the patient taken to the PACU in satisfactory condition.  [name], MD, FACS 10/03/2011 1:08 PM

## 2011-10-03 NOTE — Anesthesia Postprocedure Evaluation (Signed)
  Anesthesia Post-op Note  Patient: Carolyn [name]  Procedure(s) Performed: Procedure(s) (LRB) with comments: MODIFIED MASTECTOMY (Right)  Patient Location: PACU  Anesthesia Type: General  Level of Consciousness: awake  Airway and Oxygen Therapy: Patient Spontanous Breathing  Post-op Pain: mild  Post-op Assessment: Post-op Vital signs reviewed, Patient's Cardiovascular Status Stable, Respiratory Function Stable, Patent Airway, No signs of Nausea or vomiting and Pain level controlled  Post-op Vital Signs: stable  Complications: No apparent anesthesia complications

## 2011-10-03 NOTE — Anesthesia Procedure Notes (Signed)
Procedure Name: LMA Insertion Date/Time: 10/03/2011 11:07 AM Performed by: Zenia Resides D Pre-anesthesia Checklist: Patient identified, Emergency Drugs available, Suction available, Patient being monitored and Timeout performed Patient Re-evaluated:Patient Re-evaluated prior to inductionOxygen Delivery Method: Circle System Utilized Preoxygenation: Pre-oxygenation with 100% oxygen Intubation Type: IV induction Ventilation: Mask ventilation without difficulty LMA: LMA inserted LMA Size: 4.0 Number of attempts: 1 Airway Equipment and Method: bite block Placement Confirmation: positive ETCO2 and breath sounds checked- equal and bilateral Tube secured with: Tape Dental Injury: Teeth and Oropharynx as per pre-operative assessment

## 2011-10-03 NOTE — Anesthesia Preprocedure Evaluation (Signed)
Anesthesia Evaluation  Patient identified by MRN, date of birth, ID band Patient awake    Reviewed: Allergy & Precautions, H&P , NPO status , Patient's Chart, lab work & pertinent test results  Airway Mallampati: I TM Distance: >3 FB Neck ROM: full    Dental  (+) Teeth Intact   Pulmonary neg pulmonary ROS,  breath sounds clear to auscultation  Pulmonary exam normal       Cardiovascular negative cardio ROS  Rhythm:regular Rate:Normal     Neuro/Psych    GI/Hepatic negative GI ROS, Neg liver ROS,   Endo/Other  negative endocrine ROS  Renal/GU negative Renal ROS     Musculoskeletal negative musculoskeletal ROS (+)   Abdominal   Peds  Hematology negative hematology ROS (+)   Anesthesia Other Findings   Reproductive/Obstetrics negative OB ROS                           Anesthesia Physical Anesthesia Plan  ASA: II  Anesthesia Plan: General   Post-op Pain Management:    Induction: Intravenous  Airway Management Planned: LMA  Additional Equipment:   Intra-op Plan:   Post-operative Plan: Extubation in OR  Informed Consent: I have reviewed the patients History and Physical, chart, labs and discussed the procedure including the risks, benefits and alternatives for the proposed anesthesia with the patient or authorized representative who has indicated his/her understanding and acceptance.     Plan Discussed with: CRNA and Surgeon  Anesthesia Plan Comments:         Anesthesia Quick Evaluation

## 2011-10-03 NOTE — Interval H&P Note (Signed)
History and Physical Interval Note:  10/03/2011 10:49 AM  Carolyn [name]  has presented today for surgery, with the diagnosis of right breast cancer  The various methods of treatment have been discussed with the patient and family. After consideration of risks, benefits and other options for treatment, the patient has consented to  Procedure(s) (LRB) with comments: MODIFIED MASTECTOMY (Right) as a surgical intervention .  The patient's history has been reviewed, patient examined, no change in status, stable for surgery.  I have reviewed the patient's chart and labs.  Questions were answered to the patient's satisfaction.     Suren Payne J  The right breast is marked as the operative site

## 2011-10-03 NOTE — H&P (View-Only) (Signed)
Patient ID: Carolyn [name], female   DOB: 06-01-1970, 41 y.o.   MRN: [id number]  Chief Complaint  Patient presents with  . Pre-op Exam    Rt br    HPI Carolyn [name] is a 41 y.o. female.she has completed her neoadjuvant chemotherapy for her right breast cancer. She apparently was originally scheduled for 6 treatments but has completed 4 and have decided to stop. She comes in to discuss surgical options at this point in time. She is having no breast symptoms and overall feels well. She believes that she has tolerated her chemotherapy well. Past Medical History  Diagnosis Date  . Breast cancer     Past Surgical History  Procedure Date  . Wisdom tooth extraction   . Breast biopsy     Left  . Portacath placement 06/20/2011    Procedure: INSERTION PORT-A-CATH;  Surgeon: [name], MD;  Location: [facility] OR;  Service: General;  Laterality: N/A;    Family History  Problem Relation Age of Onset  . Breast cancer Maternal Aunt     Social History History  Substance Use Topics  . Smoking status: Never Smoker   . Smokeless tobacco: Never Used  . Alcohol Use: No    Allergies  Allergen Reactions  . Allegra (Fexofenadine) Hives    Abdomen only  . Shellfish Allergy Hives    Abdomen only    Current Outpatient Prescriptions  Medication Sig Dispense Refill  . acetaminophen (TYLENOL) 500 MG tablet Take 1,000 mg by mouth every 6 (six) hours as needed.      . Cholecalciferol (VITAMIN D3) 2000 UNITS TABS Take 2,000 Units by mouth daily.      Marland Kitchen dexamethasone (DECADRON) 4 MG tablet Take 1 tablet (4 mg total) by mouth 2 (two) times daily with a meal. Two tablets twice a day the day before chemotherapy.  Two tablets twice a day for 3 days after chemotherapy  30 tablet  0  . lidocaine-prilocaine (EMLA) cream Apply topically as needed. Apply to port area 45 mins prior to treatment.  30 g  5  . lidocaine-prilocaine (EMLA) cream Apply topically as needed.  30 g  6  . Loratadine 10 MG CAPS Take 10  mg by mouth daily.      . Multiple Vitamin (MULTIVITAMIN) tablet Take 1 tablet by mouth daily.        Review of Systems Review of Systems  Constitutional: Negative for fever, chills and unexpected weight change.  HENT: Negative for hearing loss, congestion, sore throat, trouble swallowing and voice change.   Eyes: Negative for visual disturbance.  Respiratory: Negative for cough and wheezing.   Cardiovascular: Negative for chest pain, palpitations and leg swelling.  Gastrointestinal: Negative for nausea, vomiting, abdominal pain, diarrhea, constipation, blood in stool, abdominal distention and anal bleeding.  Genitourinary: Negative for hematuria, vaginal bleeding and difficulty urinating.  Musculoskeletal: Negative for arthralgias.  Skin: Negative for rash and wound.  Neurological: Negative for seizures, syncope and headaches.  Hematological: Negative for adenopathy. Does not bruise/bleed easily.  Psychiatric/Behavioral: Negative for confusion.    Blood pressure 124/64, pulse 90, temperature 97.4 F (36.3 C), temperature source Temporal, resp. rate 18, height 5\' 8"  (1.727 m), weight 136 lb 6.4 oz (61.871 kg), last menstrual period 07/05/2011, SpO2 98.00%.  Physical Exam VS: BP 124/64  Pulse 90  Temp 97.4 F (36.3 C) (Temporal)  Resp 18  Ht 5\' 8"  (1.727 m)  Wt 136 lb 6.4 oz (61.871 kg)  BMI 20.74 kg/m2  SpO2 98%  LMP 07/05/2011  Physical Exam  Vitals reviewed. Constitutional: She is oriented to person, place, and time. She appears well-developed and well-nourished. No distress.  HENT:  Head: Normocephalic and atraumatic.  Mouth/Throat: Oropharynx is clear and moist.  Eyes: Conjunctivae and EOM are normal. Pupils are equal, round, and reactive to light. No scleral icterus.  Neck: Normal range of motion. Neck supple. No tracheal deviation present. No thyromegaly present.  Cardiovascular: Normal rate, regular rhythm, normal heart sounds and intact distal pulses.  Exam reveals  no gallop and no friction rub.   No murmur heard. Pulmonary/Chest: Effort normal and breath sounds normal. No respiratory distress. She has no wheezes. She has no rales.   Breasts: The left breast is normal. There is no obvious mass in the right breast with fairly laterally and about the 8:30 position is some nodularity residual from her original diagnosis. The nipple area looks normal.  Lymphatics: There is no axillary or supraclavicular adenopathy noted.  Abdominal: Soft. Bowel sounds are normal. She exhibits no distension and no mass. There is no tenderness. There is no rebound and no guarding.  Musculoskeletal: Normal range of motion. She exhibits no edema and no tenderness.  Neurological: She is alert and oriented to person, place, and time.  Skin: Skin is warm and dry. No rash noted. She is not diaphoretic. No erythema.  Psychiatric: She has a normal mood and affect. Her behavior is normal. Judgment and thought content normal.  Lymphatics: There's a question of a right axillary lymph node present. I did not appreciate any left axillary or supraclavicular adenopathy on either side.  Data Reviewed I have reviewed the interval notes from the medical oncologist. I will also review the recent MRI which shows continued area of 5 cm plus deep in the right breast.  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 The area really has not significantly reduced in size with her chemotherapy although certainly the mass effect has resolved. In addition she has relatively small breasts and I think removing the area of involvement was caused significant deformity.  I discussed this with her and she is agreeable to proceeding. She understands that she will need to be in the hospital, will have drainage postoperatively and a lead slow  recovery. She also knows that she may need post mastectomy radiation.    CC: Dr Agustin Cree 09/15/2011, 12:49 PM

## 2011-10-03 NOTE — Transfer of Care (Signed)
Immediate Anesthesia Transfer of Care Note  Patient: Carolyn [name]  Procedure(s) Performed: Procedure(s) (LRB) with comments: MODIFIED MASTECTOMY (Right)  Patient Location: PACU  Anesthesia Type: General  Level of Consciousness: awake, alert  and oriented  Airway & Oxygen Therapy: Patient Spontanous Breathing and Patient connected to face mask oxygen  Post-op Assessment: Report given to PACU RN and Post -op Vital signs reviewed and stable  Post vital signs: Reviewed and stable  Complications: No apparent anesthesia complications

## 2011-10-04 ENCOUNTER — Encounter (HOSPITAL_BASED_OUTPATIENT_CLINIC_OR_DEPARTMENT_OTHER): Payer: Self-pay | Admitting: Surgery

## 2011-10-04 MED ORDER — OXYCODONE-ACETAMINOPHEN 5-325 MG PO TABS: 1.0000 | ORAL_TABLET | ORAL | Status: DC | PRN

## 2011-10-04 NOTE — Progress Notes (Signed)
1 Day Post-Op  Subjective: Feels OK, not much pain, has been up, tolerating diet wants to go home, knows JP management  Objective: Vital signs in last 24 hours: Temp:  [97.5 F (36.4 C)-99.1 F (37.3 C)] 98.9 F (37.2 C) (09/25 0700) Pulse Rate:  [65-90] 77  (09/25 0700) Resp:  [10-19] 16  (09/25 0700) BP: (109-123)/(6-75) 109/36 mmHg (09/25 0700) SpO2:  [95 %-100 %] 98 % (09/25 0700) Weight:  [136 lb (61.689 kg)] 136 lb (61.689 kg) (09/24 0929)   Intake/Output from previous day: 09/24 0701 - 09/25 0700 In: 3248.3 [P.O.:882; I.V.:2366.3] Out: 3537 [Urine:3150; Emesis/NG output:200; Drains:187] Intake/Output this shift:     General appearance: alert, cooperative and no distress Resp: clear to auscultation bilaterally  Incision: Dressing dry, JP's thin  Lab Results:  No results found for this basename: WBC:2,HGB:2,HCT:2,PLT:2 in the last 72 hours BMET No results found for this basename: NA:2,K:2,CL:2,CO2:2,GLUCOSE:2,BUN:2,CREATININE:2,CALCIUM:2 in the last 72 hours PT/INR No results found for this basename: LABPROT:2,INR:2 in the last 72 hours ABG No results found for this basename: PHART:2,PCO2:2,PO2:2,HCO3:2 in the last 72 hours  MEDS, Scheduled    .  ceFAZolin (ANCEF) IV  1 g Intravenous Q6H  . heparin  5,000 Units Subcutaneous Q8H  . DISCONTD:  ceFAZolin (ANCEF) IV  2 g Intravenous 60 min Pre-Op  . DISCONTD: chlorhexidine  1 application Topical Once  . DISCONTD: chlorhexidine  1 application Topical Once  . DISCONTD: fentaNYL  50-100 mcg Intravenous Once    Studies/Results: No results found.  Assessment: s/p Procedure(s): MODIFIED MASTECTOMY Patient Active Problem List  Diagnosis  . Cancer of upper-outer quadrant of Right breast    Doing well and able to go home  Plan: Discharge   LOS: 1 day     [name], MD, [facility], [place] [phone]   10/04/2011 7:45 AM

## 2011-10-06 ENCOUNTER — Ambulatory Visit (HOSPITAL_BASED_OUTPATIENT_CLINIC_OR_DEPARTMENT_OTHER): Payer: BC Managed Care – PPO | Admitting: Oncology

## 2011-10-06 ENCOUNTER — Telehealth: Payer: Self-pay | Admitting: *Deleted

## 2011-10-06 ENCOUNTER — Encounter: Payer: Self-pay | Admitting: Oncology

## 2011-10-06 ENCOUNTER — Other Ambulatory Visit (HOSPITAL_BASED_OUTPATIENT_CLINIC_OR_DEPARTMENT_OTHER): Payer: BC Managed Care – PPO | Admitting: Lab

## 2011-10-06 ENCOUNTER — Telehealth (INDEPENDENT_AMBULATORY_CARE_PROVIDER_SITE_OTHER): Payer: Self-pay | Admitting: General Surgery

## 2011-10-06 VITALS — BP 111/75 | HR 85 | Temp 98.6°F | Resp 20 | Ht 68.0 in | Wt 134.8 lb

## 2011-10-06 DIAGNOSIS — C773 Secondary and unspecified malignant neoplasm of axilla and upper limb lymph nodes: Secondary | ICD-10-CM

## 2011-10-06 DIAGNOSIS — C50419 Malignant neoplasm of upper-outer quadrant of unspecified female breast: Secondary | ICD-10-CM

## 2011-10-06 DIAGNOSIS — Z170 Estrogen receptor positive status [ER+]: Secondary | ICD-10-CM

## 2011-10-06 DIAGNOSIS — C50919 Malignant neoplasm of unspecified site of unspecified female breast: Secondary | ICD-10-CM

## 2011-10-06 LAB — CBC WITH DIFFERENTIAL/PLATELET
Basophils Absolute: 0 10*3/uL (ref 0.0–0.1)
EOS%: 1.3 % (ref 0.0–7.0)
HCT: 35.8 % (ref 34.8–46.6)
HGB: 12.1 g/dL (ref 11.6–15.9)
MCH: 31.3 pg (ref 25.1–34.0)
MCV: 92.3 fL (ref 79.5–101.0)
MONO%: 5.8 % (ref 0.0–14.0)
NEUT%: 71.2 % (ref 38.4–76.8)
RDW: 15.4 % — ABNORMAL HIGH (ref 11.2–14.5)

## 2011-10-06 LAB — COMPREHENSIVE METABOLIC PANEL (CC13)
AST: 21 U/L (ref 5–34)
Alkaline Phosphatase: 90 U/L (ref 40–150)
BUN: 10 mg/dL (ref 7.0–26.0)
Creatinine: 0.8 mg/dL (ref 0.6–1.1)

## 2011-10-06 NOTE — Progress Notes (Signed)
OFFICE PROGRESS NOTE  CC  [name],[name], MD [address] [place] [address] Dr. Cyndia Bent Dr. 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 She had an ultrasound that revealed an area ofconcern measuring 2.9 cm. She also was found to have a right axillary lymph node that was suspicious for malignancy. Patient went on to have biopsy of the calcifications that showed high-grade ductal carcinoma in situ. Biopsy of the right axillary lymph node revealed an invasive ductal carcinoma. The tumor was ER positive PR positive HER-2/neu negative with a Ki-67 of 14%.    #2 on MRI patient was also found to have other areas of concern in the contralateral breast. Also was noted that in the right breast the tumor abutted the chest wall.since then patient has gone on to have biopsies performed at the other areas that are negative for a malignancy. The pathology  is shown below.  #3 patient was begun on neoadjuvant chemotherapy consisting of Taxotere and Cytoxan on 06/27/2011 - 09/04/11  #4 patient is now status post mastectomy but did show residual disease measuring 1.1 cm with 3 lymph nodes positive for metastatic disease.  CURRENT THERAPY: Refer to radiation oncology   INTERVAL HISTORY: Carolyn [name] 41 y.o. female returns for followup visit. Briefly patient is doing well she still has a drain in place. We discussed the pathology today. She denies any nausea vomiting fevers chills night sweats headaches no shortness of breath or chest pains she is having pain at the incisional  site. Remainder of the review of systems is unremarkable.  MEDICAL HISTORY: Past Medical History  Diagnosis Date  . Breast cancer     ALLERGIES:  is allergic to allegra and shellfish allergy.  MEDICATIONS:  Current Outpatient Prescriptions  Medication Sig Dispense Refill  . acetaminophen (TYLENOL) 500 MG tablet Take 1,000 mg by mouth every 6 (six) hours as needed.      . Cholecalciferol (VITAMIN D3) 2000 UNITS TABS Take 2,000 Units by mouth daily.      Marland Kitchen dexamethasone (DECADRON) 4 MG tablet Take 1 tablet (4 mg total) by mouth 2 (two) times daily with a meal. Two tablets twice a day the day before chemotherapy.  Two tablets twice a day for 3 days after chemotherapy  30 tablet  0  . lidocaine-prilocaine (EMLA) cream Apply topically as needed. Apply to port area 45 mins prior to treatment.  30 g  5  . lidocaine-prilocaine (EMLA) cream Apply topically as needed.  30 g  6  . Loratadine 10 MG CAPS Take 10 mg by mouth daily.      . Multiple Vitamin (MULTIVITAMIN) tablet Take 1 tablet by mouth daily.      Marland Kitchen oxyCODONE-acetaminophen (PERCOCET/ROXICET) 5-325 MG per tablet Take 1-2 tablets by mouth every 4 (four) hours as needed.  30 tablet  0    SURGICAL HISTORY:  Past Surgical History  Procedure Date  . Wisdom tooth extraction   . Breast biopsy     Left  . Portacath placement 06/20/2011    Procedure: INSERTION PORT-A-CATH;  Surgeon: [name], MD;  Location: [facility] OR;  Service: General;  Laterality: N/A;  . Modified mastectomy 10/03/2011    Procedure: MODIFIED MASTECTOMY;  Surgeon: [name], MD;  Location: [facility];  Service: General;  Laterality: Right;    REVIEW OF SYSTEMS:  Pertinent items are noted in HPI.   PHYSICAL EXAMINATION: General appearance: alert, cooperative and appears stated age Lymph nodes: Cervical, supraclavicular, and axillary nodes normal. Resp: clear to auscultation bilaterally and normal percussion bilaterally Back: symmetric, no  curvature. ROM normal. No CVA tenderness. Cardio: regular rate and rhythm, S1, S2 normal, no murmur, click, rub or gallop GI: soft, non-tender; bowel sounds normal; no masses,  no organomegaly Extremities: extremities normal, atraumatic, no cyanosis or edema Neurologic: Grossly normal  ECOG PERFORMANCE STATUS: 0 - Asymptomatic  Blood pressure 111/75, pulse 85, temperature 98.6 F (37 C), temperature source Oral, resp. rate 20, height 5\' 8"  (1.727 m), weight 134 lb 12.8 oz (61.145 kg).  LABORATORY DATA: Lab Results  Component Value Date   WBC 3.9 10/06/2011   HGB 12.1 10/06/2011   HCT 35.8 10/06/2011   MCV 92.3 10/06/2011   PLT 156 10/06/2011      Chemistry      Component Value Date/Time   NA 141 09/29/2011 1430   NA 140 09/05/2011 0842   K 3.8 09/29/2011 1430   K 3.9 09/05/2011 0842   CL 101 09/29/2011 1430   CL 98 09/05/2011 0842   CO2 30 09/29/2011 1430   CO2 32* 09/05/2011 0842   BUN 12 09/29/2011 1430   BUN 10.0 09/05/2011 0842   CREATININE 0.80 09/29/2011 1430   CREATININE 0.9 09/05/2011 0842      Component Value Date/Time   CALCIUM 10.4 09/29/2011 1430   CALCIUM 9.4 09/05/2011 0842   ALKPHOS 101 09/29/2011 1430   ALKPHOS 127 09/05/2011 0842   AST 25 09/29/2011 1430   AST 49* 09/05/2011 0842   ALT 21 09/29/2011 1430   ALT 96* 09/05/2011 0842   BILITOT 0.3 09/29/2011 1430   BILITOT 0.40 09/05/2011 0842     FINAL DIAGNOSIS Diagnosis 1. Breast, modified radical mastectomy , Right - INVASIVE DUCTAL CARCINOMA, GRADE I / III, SPANNING 1.1 CM. - DUCTAL CARCINOMA IN SITU WITH CALCIFICATIONS, LOW GRADE. - FIBROADENOMA. - METASTATIC CARCINOMA IN 3 OF 14 LYMPH NODES (3/14). - THE SURGICAL RESECTION MARGINS ARE NEGATIVE FOR CARCINOMA. - SEE ONCOLOGY TABLE BELOW. 2. Lymph nodes, regional resection, Right axilla - THERE IS NO EVIDENCE OF CARCINOMA IN 4 OF 4 LYMPH NODES (0/4). Microscopic Comment 1. BREAST, INVASIVE TUMOR, WITH LYMPH NODE SAMPLING Specimen, including laterality: Right  breast. Procedure: Modified radical mastectomy. Grade: I. Tubule formation: 2. Nuclear pleomorphism: 1. Mitotic: 1. Tumor size (glass slide measurement): 1.1 cm. Margins: Negative for carcinoma. Invasive, distance to closest margin: 2.0 cm to the deep margin (gross measurement). In-situ, distance to closest margin: 2.0 cm to the deep margin (gross measurement). Lymphovascular invasion: Not identified. Ductal carcinoma in situ: Present. Grade: Low grade. Extensive intraductal component: Yes. Lobular neoplasia: Not identified. Tumor focality: Unifocal. Treatment effect: None to minimal. Extent of tumor: Confined to breast parenchyma. Lymph nodes: # examined: 18. 1 of 3 FINAL for Pfefferle, Donatella C ([id number]) Microscopic Comment(continued) Lymph nodes with metastasis: 3. Isolated tumor cells (< 0.2 mm): 0. Micrometastasis: (> 0.2 mm and < 2.0 mm): 0. Macrometastasis: (> 2.0 mm): 3. Extracapsular extension: Not identified. Breast prognostic profile: Will be repeated on the current case and results reported separately. TNM: ypT1c, ypN1c. (JBK:eps 10/05/11) Pecola Leisure MD Pathologist, Electronic Signature (Case signed 10/05/2011)  Spec RADIOGRAPHIC STUDIES:   ASSESSMENT: 41 year old female with  #1 stage IIB invasive ductal carcinoma of the right breast. Patient presented with calcifications in the right breast with right axillary lymph node that was positive for invasive ductal carcinoma. The area of concern in the right buttocks the underlying muscle and therefore patient is being offered neoadjuvant chemotherapy. Patient and I have discussed treatment with Taxotere and Cytoxan. Plan is to get give her about 6 cycles of treatment.  #2 patient is now status post mastectomy with the final pathology revealing residual 1.1 cm invasive ductal carcinoma with DCIS grade 13 of 18 lymph nodes positive for metastatic disease tumor was ER positive PR positive HER-2/neu negative. Patient and I  discussed her final pathology.  PLAN:  #1 patient will now proceed with radiation therapy and consultation is placed.  #2 once she completes radiation therapy then patient will go on to go on antiestrogen therapy consisting of tamoxifen 20 mg. A total of 10 years of therapy will be planned.  All questions were answered. The patient knows to call the clinic with any problems, questions or concerns. We can certainly see the patient much sooner if necessary.  I spent 15 minutes counseling the patient face to face. The total time spent in the appointment was 30 minutes.    Drue Second, MD Medical/Oncology [facility] [phone] (beeper) [phone] (Office)

## 2011-10-06 NOTE — Telephone Encounter (Signed)
Gave patient appointment for dr.wentworth on 10-12-2011 at 9:00am,  12-15-2011 lab and md starting at 2:30pm  Patient aware of both appointments

## 2011-10-06 NOTE — Patient Instructions (Addendum)
Refer to Dr. Michell Heinrich  I will see you back in 2 months  We discussed your pathology and you received you final pathology results

## 2011-10-06 NOTE — Telephone Encounter (Signed)
Message copied by Liliana Cline on Fri Oct 06, 2011  5:24 PM ------      Message from: [name]      Created: Fri Oct 06, 2011  3:48 PM       Let her know only a small amount of cancer left after chemo. I will go over with her in the office in detail

## 2011-10-06 NOTE — Telephone Encounter (Signed)
Patient aware of pathology. She discussed it with Dr Welton Flakes. She will keep follow up appt next week with Dr Jamey Ripa.

## 2011-10-09 ENCOUNTER — Encounter (HOSPITAL_BASED_OUTPATIENT_CLINIC_OR_DEPARTMENT_OTHER): Payer: Self-pay

## 2011-10-10 ENCOUNTER — Ambulatory Visit: Payer: BC Managed Care – PPO | Admitting: Oncology

## 2011-10-10 ENCOUNTER — Ambulatory Visit: Payer: BC Managed Care – PPO

## 2011-10-10 ENCOUNTER — Telehealth: Payer: Self-pay | Admitting: Genetic Counselor

## 2011-10-10 ENCOUNTER — Other Ambulatory Visit: Payer: BC Managed Care – PPO | Admitting: Lab

## 2011-10-10 NOTE — Telephone Encounter (Signed)
error 

## 2011-10-10 NOTE — Telephone Encounter (Signed)
Revealed RAD51C mutation.  Patient will come back for counseling on 10/4 at 10.

## 2011-10-11 ENCOUNTER — Encounter: Payer: Self-pay | Admitting: Radiation Oncology

## 2011-10-11 ENCOUNTER — Ambulatory Visit: Payer: BC Managed Care – PPO

## 2011-10-11 NOTE — Progress Notes (Signed)
41 year old female.  Seen by Dr. Michell Heinrich in [facility] on 05/31/2011. Completed 4 planned cycles of neoadjuvant chemotherapy consisting of Taxotere and Cytoxan. Dr. Gaston Islam performed right modified radical mastectomy on 10/03/2011. Pathology confirmed invasive ductal carcinoma with 3 of 14 nodes positive and negative margins. Dr. Milta Deiters note states that the patient will need antiestrogen therapy, tamoxifen.  Post op recheck with Strek scheduled for 10/3.  AX: Allegra and shellfish No indication of a pacemaker No hx of radiation therapy

## 2011-10-12 ENCOUNTER — Ambulatory Visit
Admission: RE | Admit: 2011-10-12 | Discharge: 2011-10-12 | Disposition: A | Discharge status: Home / Self Care | Payer: BC Managed Care – PPO | Source: Ambulatory Visit | Attending: Radiation Oncology | Admitting: Radiation Oncology

## 2011-10-12 ENCOUNTER — Encounter: Payer: Self-pay | Admitting: Radiation Oncology

## 2011-10-12 ENCOUNTER — Ambulatory Visit (INDEPENDENT_AMBULATORY_CARE_PROVIDER_SITE_OTHER): Payer: BC Managed Care – PPO | Admitting: Surgery

## 2011-10-12 ENCOUNTER — Encounter: Payer: Self-pay | Admitting: *Deleted

## 2011-10-12 ENCOUNTER — Encounter (INDEPENDENT_AMBULATORY_CARE_PROVIDER_SITE_OTHER): Payer: Self-pay | Admitting: Surgery

## 2011-10-12 VITALS — BP 112/72 | HR 76 | Temp 97.0°F | Resp 16 | Ht 68.0 in | Wt 133.0 lb

## 2011-10-12 VITALS — BP 107/70 | HR 83 | Temp 97.6°F | Resp 18 | Ht 68.0 in | Wt 134.1 lb

## 2011-10-12 DIAGNOSIS — Z9221 Personal history of antineoplastic chemotherapy: Secondary | ICD-10-CM | POA: Insufficient documentation

## 2011-10-12 DIAGNOSIS — C50419 Malignant neoplasm of upper-outer quadrant of unspecified female breast: Secondary | ICD-10-CM

## 2011-10-12 DIAGNOSIS — Z510 Encounter for antineoplastic radiation therapy: Secondary | ICD-10-CM | POA: Insufficient documentation

## 2011-10-12 DIAGNOSIS — Z9010 Acquired absence of unspecified breast and nipple: Secondary | ICD-10-CM | POA: Insufficient documentation

## 2011-10-12 DIAGNOSIS — Z79899 Other long term (current) drug therapy: Secondary | ICD-10-CM | POA: Insufficient documentation

## 2011-10-12 DIAGNOSIS — L988 Other specified disorders of the skin and subcutaneous tissue: Secondary | ICD-10-CM | POA: Insufficient documentation

## 2011-10-12 DIAGNOSIS — Z09 Encounter for follow-up examination after completed treatment for conditions other than malignant neoplasm: Secondary | ICD-10-CM

## 2011-10-12 NOTE — Progress Notes (Signed)
[facility] Clinical Social Work  Pt had previously applied for transportation assistance through [employer].  Clinical Social Worker met with pt in exam room at [facility] to provided required SCAT paper work and offer additional support.  CSW encouraged pt to call with any additional question or concerns.      Tamala Julian, MSW, LCSW Clinical Social Worker [facility] [phone]

## 2011-10-12 NOTE — Progress Notes (Signed)
Department of Radiation Oncology  Phone:  [phone] Fax:        [phone]   Name: Carolyn [name]   DOB: 04/27/1970  MRN: [id number]    Date: 10/12/2011  Follow Up Visit Note  Diagnosis: T3 N1 invasive ductal carcinoma of the right breast.   Interval History: Carolyn [name] presents today for routine followup.  She tolerated her chemotherapy well. She had 4 cycles of Taxotere and Cytoxan completing that in September. A followup MRI in August showed the mass had slightly decreased in size measuring 5 x 3.4 x 1.5 cm. When I first saw her she had a lesion that was deep along the pack which was biopsied and consistent with a fibroadenoma. Due to the lack of extreme response to neoadjuvant chemotherapy she underwent mastectomy under the care of Dr. Jamey Ripa on 10/03/2011. This revealed a 1.1 invasive ductal carcinoma with associated low-grade ductal carcinoma in situ. The surgical resection margins were negative and 3/17 lymph nodes were positive. Estrogen receptor was positive HER-2 negative. She has seen Dr. Jamey Ripa this afternoon. She is feeling well and has been doing some of the stretching exercises provided to her by physical therapy. She still has to drains in place which have minimal drainage. She is still taking Percocet for pain.  Allergies:  Allergies  Allergen Reactions  . Allegra (Fexofenadine) Hives    Abdomen only  . Shellfish Allergy Hives    Abdomen only    Medications:  Current Outpatient Prescriptions  Medication Sig Dispense Refill  . acetaminophen (TYLENOL) 500 MG tablet Take 1,000 mg by mouth every 6 (six) hours as needed.      . Cholecalciferol (VITAMIN D3) 2000 UNITS TABS Take 2,000 Units by mouth daily.      . Loratadine 10 MG CAPS Take 10 mg by mouth daily.      . Multiple Vitamin (MULTIVITAMIN) tablet Take 1 tablet by mouth daily.      Marland Kitchen oxyCODONE-acetaminophen (PERCOCET/ROXICET) 5-325 MG per tablet Take 1-2 tablets by mouth every 4 (four) hours as needed.  30 tablet   0  . dexamethasone (DECADRON) 4 MG tablet Take 1 tablet (4 mg total) by mouth 2 (two) times daily with a meal. Two tablets twice a day the day before chemotherapy.  Two tablets twice a day for 3 days after chemotherapy  30 tablet  0  . lidocaine-prilocaine (EMLA) cream Apply topically as needed. Apply to port area 45 mins prior to treatment.  30 g  5  . lidocaine-prilocaine (EMLA) cream Apply topically as needed.  30 g  6    Physical Exam:   height is 5\' 8"  (1.727 m) and weight is 134 lb 1.6 oz (60.827 kg). Her oral temperature is 97.6 F (36.4 C). Her blood pressure is 107/70 and her pulse is 83. Her respiration is 18.  She is a pleasant female in no distress sitting comfortably examining chair. She is not able to move her right arm above her shoulders.  IMPRESSION: Carolyn [name] is a 41 y.o. female with a pathologic partial response to neoadjuvant chemotherapy and an initial T3 N1 tumor  PLAN:  I discussed with Carolyn [name] the indications for postmastectomy radiation. We discussed the process of simulation the placement tattoos. We discussed skin redness erythema and darkening. We discussed that the starting could be permanent. We discussed the increased risk of complications with reconstruction. We discussed 33 treatments as an outpatient to the chest wall and supraclavicular fossa. I think she still needs a couple weeks to work  her arms so I have scheduled her for simulation in 2 weeks and encouraged her to continue doing the exercises given to her by physical therapy. She has signed informed consent.    Lurline Hare, MD

## 2011-10-12 NOTE — Patient Instructions (Addendum)
You may shower starting tomorrow. Keep a sterile gauze dressing with some antibiotic ointment on the drain. See my nurse next week to see if the remaining drain can be removed. See me in 2 weeks.

## 2011-10-12 NOTE — Progress Notes (Signed)
[facility]    [id number] 10/12/2011    Sep 07, 1970   CC: Post op right mastectomy  HPI: The patient returns for post op follow-up. She underwent a right modified radical mastectomy. Over all she feels that she is doing well. Minimal pain.  PE: VITAL SIGNS: BP 112/72  Pulse 76  Temp 97 F (36.1 C) (Temporal)  Resp 16  Ht 5\' 8"  (1.727 m)  Wt 133 lb (60.328 kg)  BMI 20.22 kg/m2  The incision is healing nicely and there is no evidence of infection or hematoma.  The drains are slowing down.  DATA REVIEWED: Pathology report showed a small amount of residual carcinoma in the breast and 3/18 lymph nodes involved.  IMPRESSION: Patient doing well. Medial drain ready to be removed  PLAN: Her next visit will be in one week for the nurse to check to see if the remaining drain can be removed. I will see her in 2 weeks. I gave her a copy of her pathology report.Marland Kitchen

## 2011-10-12 NOTE — Progress Notes (Signed)
Received patient in the clinic today for consultation with Dr. Michell Heinrich to discuss the role of radiation therapy in the treatment of right breast ca. Patient is alert and oriented to person, place, and time. No distress noted. Steady gait noted. Pleasant affect noted. Patient reports right breast pain 5 on a scale of 0-10. Patient reports that she has not taken any medication this morning but, took percocet two tablets last night. Patient to see Dr. Gaston Islam today for follow up. Patient still has two JP drains present. Patient reports that both drains are putting out between 2-2.5 cc. Patient denies redness or warmth of the surgical site. Patient denies nausea, vomiting, headache, dizziness. Patient reports that her weight remains stable. Patient reports a great appetite. Patient denies difficulty sleeping so long as pain is controlled. Patient reports she is on medical leave from her job at [employer]. Reported all findings to Dr. Michell Heinrich.

## 2011-10-12 NOTE — Progress Notes (Signed)
See progress note under physician encounter. 

## 2011-10-13 ENCOUNTER — Ambulatory Visit (HOSPITAL_BASED_OUTPATIENT_CLINIC_OR_DEPARTMENT_OTHER): Payer: BC Managed Care – PPO | Admitting: Genetic Counselor

## 2011-10-13 ENCOUNTER — Encounter: Payer: Self-pay | Admitting: Genetic Counselor

## 2011-10-13 DIAGNOSIS — C50419 Malignant neoplasm of upper-outer quadrant of unspecified female breast: Secondary | ICD-10-CM

## 2011-10-13 DIAGNOSIS — IMO0002 Reserved for concepts with insufficient information to code with codable children: Secondary | ICD-10-CM

## 2011-10-13 DIAGNOSIS — C773 Secondary and unspecified malignant neoplasm of axilla and upper limb lymph nodes: Secondary | ICD-10-CM

## 2011-10-13 DIAGNOSIS — Z803 Family history of malignant neoplasm of breast: Secondary | ICD-10-CM

## 2011-10-13 NOTE — Progress Notes (Signed)
Carolyn [name], a 41 y.o. female, came in for a discussion of her genetic testing results. She presents to clinic today to discuss the possibility of a genetic predisposition to cancer, and to further clarify her risks, as well as her family members' risks for cancer.   HISTORY OF PRESENT ILLNESS: In September 2013, at the age of 50, Carolyn [name] was diagnosed with a RAD51C mutation that is associated with an increased risk for breast and ovarian cancer.  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  Past Medical History  Diagnosis Date  . 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 Wisdom tooth extraction   . Breast biopsy     Left  . Portacath placement 06/20/2011    Procedure: INSERTION PORT-A-CATH;  Surgeon: [name], MD;  Location: [facility] OR;  Service: General;  Laterality: N/A;  . Modified mastectomy 10/03/2011    Procedure: MODIFIED MASTECTOMY;  Surgeon: [name], MD;  Location: [facility];  Service: General;  Laterality: Right;    History  Substance Use Topics  . Smoking status: Never Smoker   . Smokeless tobacco: Never Used  . Alcohol Use: No    FAMILY HISTORY:  We obtained a detailed, 4-generation family history.  Significant diagnoses are listed below: Family History  Problem Relation Age of Onset  . Breast cancer Maternal Aunt 45  . Pancreatic cancer Maternal Grandfather   . Pancreatic cancer Maternal Aunt     died in her 44s  . Leukemia Maternal Aunt     died in her 14s  . Ovarian cancer Cousin 38    maternal cousin  the patient was diagnosed with breast cancer at age 41.  She had four sisters, one of whom died at age 47 from congestive heart failure.  One sister has recently had some findings on her fallopian tubes.  Her mother died of heart disease at age 35.  She had five  maternal aunts.  One aunt had breast cancer at age 79, and another aunt had pancreatic cancer and died in her 58s.  Another aunt, the patient's mother's twin sister, had a daughter with ovarian cancer.  The patient's maternal grandfather died of pancreatic cancer.  There is no other reported cancer history.  Patient's maternal ancestors are of [place] descent, and paternal ancestors are of African American descent. There is no reported Ashkenazi Jewish ancestry. There is no known consanguinity.  GENETIC COUNSELING RISK ASSESSMENT, DISCUSSION, AND SUGGESTED FOLLOW UP: We reviewed the process of testing and her genetic test results. The patient tested positive for a mutation in the RAD51C gene, and an additional two variants.  One variant of uncertain significance (VUS) was found in the PALB2 gene, and another variant, likely benign, was found in the BARD1. RAD51C is a moderate risk breast cancer gene. Mutations within the RAD51C gene increase the risk for breast cancer 3 fold.  There is also a 9% lifetime risk for ovarian cancer. We discussed high risk breast cancer screening, as well as risk reducing mastectomy.  The patient has already had a unilateral mastectomy.  We also discussed having a risk reducing BSO-TAH.  PALB2 is also a moderate risk breast cancer gene, which has been associated with breast, pancreatic, prostate and female breast cancer.  We discussed dominant  inheritance, and that her siblings have a 50% chance of inheriting this mutation. 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 We also completed paperwork for the family studies to work up the PALB2 gene VUS'.   The patient was seen for a total of 45 minutes, greater than 50% of which  was spent face-to-face counseling.  This plan is being carried out per Dr. Feliz Beam recommendations.  This note will also be sent to the referring provider via the electronic medical record. The patient will be supplied with a summary of this genetic counseling discussion as well as educational information on the discussed hereditary cancer syndromes following the conclusion of their visit.   Patient was discussed with Dr. Drue Second.

## 2011-10-16 ENCOUNTER — Ambulatory Visit: Payer: BC Managed Care – PPO | Admitting: Oncology

## 2011-10-16 ENCOUNTER — Other Ambulatory Visit: Payer: BC Managed Care – PPO | Admitting: Lab

## 2011-10-18 ENCOUNTER — Encounter (INDEPENDENT_AMBULATORY_CARE_PROVIDER_SITE_OTHER): Payer: Self-pay

## 2011-10-18 ENCOUNTER — Ambulatory Visit (INDEPENDENT_AMBULATORY_CARE_PROVIDER_SITE_OTHER): Payer: BC Managed Care – PPO | Admitting: General Surgery

## 2011-10-18 VITALS — BP 118/78 | HR 64 | Temp 97.6°F | Resp 16 | Ht 68.0 in | Wt 136.8 lb

## 2011-10-18 DIAGNOSIS — Z5189 Encounter for other specified aftercare: Secondary | ICD-10-CM

## 2011-10-19 ENCOUNTER — Encounter (INDEPENDENT_AMBULATORY_CARE_PROVIDER_SITE_OTHER): Payer: BC Managed Care – PPO

## 2011-10-20 NOTE — Addendum Note (Signed)
Encounter addended by: Aneesh Faller Mintz Vincenza Dail, RN on: 10/20/2011  6:33 PM<BR>     Documentation filed: Charges VN

## 2011-10-23 ENCOUNTER — Telehealth: Payer: Self-pay | Admitting: Radiation Oncology

## 2011-10-23 NOTE — Telephone Encounter (Signed)
Called to confirm patient's CT/SIM appointment on 10/22 at 0900. Patient verbalized she would be present. Encouraged to call with future needs and she verbalized understanding.

## 2011-10-25 ENCOUNTER — Encounter (INDEPENDENT_AMBULATORY_CARE_PROVIDER_SITE_OTHER): Payer: BC Managed Care – PPO | Admitting: Surgery

## 2011-10-26 ENCOUNTER — Ambulatory Visit (INDEPENDENT_AMBULATORY_CARE_PROVIDER_SITE_OTHER): Payer: BC Managed Care – PPO | Admitting: Surgery

## 2011-10-26 ENCOUNTER — Encounter (INDEPENDENT_AMBULATORY_CARE_PROVIDER_SITE_OTHER): Payer: Self-pay | Admitting: Surgery

## 2011-10-26 VITALS — BP 112/80 | HR 76 | Temp 98.2°F | Resp 14 | Ht 68.0 in | Wt 134.6 lb

## 2011-10-26 DIAGNOSIS — Z09 Encounter for follow-up examination after completed treatment for conditions other than malignant neoplasm: Secondary | ICD-10-CM

## 2011-10-26 NOTE — Progress Notes (Signed)
[facility]    [id number] 10/26/2011    [birth year]   CC: Post op right mastectomy  HPI: The patient returns for post op follow-up. She underwent a right modified radical mastectomy. Over all she feels that she is doing well. Minimal pain.  PE: VITAL SIGNS: BP 112/80  Pulse 76  Temp 98.2 F (36.8 C) (Temporal)  Resp 14  Ht 5\' 8"  (1.727 m)  Wt 134 lb 9.6 oz (61.054 kg)  BMI 20.47 kg/m2  The incision is healing nicely and there is no evidence of infection or hematoma.    DATA REVIEWED: Pathology report showed a small amount of residual carcinoma in the breast and 3/18 lymph nodes involved. IMPRESSION: Patient doing well.  PLAN: I will see her again in two months, after she finishes radiation. She is interested in a prophylactic left mastectomy and will discuss that with Dr Welton Flakes.Marland KitchenMarland Kitchen

## 2011-10-26 NOTE — Patient Instructions (Signed)
See me about a month after you have finished radiation Go to the ABC class - if you don't have better shoulder mobility in a month let me know. Start doing some of the arm and shoulder exercises      Carolyn [name]  is OK to attend the ABC Class [name], MD, [facility], [place] [phone] 10/26/2011 3:02 PM

## 2011-10-31 ENCOUNTER — Ambulatory Visit
Admission: RE | Admit: 2011-10-31 | Discharge: 2011-10-31 | Disposition: A | Discharge status: Home / Self Care | Payer: BC Managed Care – PPO | Source: Ambulatory Visit | Attending: Radiation Oncology | Admitting: Radiation Oncology

## 2011-10-31 DIAGNOSIS — C50419 Malignant neoplasm of upper-outer quadrant of unspecified female breast: Secondary | ICD-10-CM

## 2011-10-31 NOTE — Progress Notes (Signed)
Name: Carolyn [name]   MRN: [id number]  Date:  10/31/2011  DOB: [birth year]  Status:outpatient    DIAGNOSIS: Breast cancer.  CONSENT VERIFIED: yes   SET UP: Patient is setup supine   IMMOBILIZATION:  The following immobilization was used:Custom Moldable Pillow, breast board.   NARRATIVE: Ms. Biedrzycki was brought to the CT Simulation planning suite.  Identity was confirmed.  All relevant records and images related to the planned course of therapy were reviewed.  Then, the patient was positioned in a stable reproducible clinical set-up for radiation therapy.  Wires were placed to delineate the clinical extent of breast tissue. A wire was placed on the scar as well.  BBs were placed to mark the drain sites. CT images were obtained.  An isocenter was placed. Skin markings were placed.  The CT images were loaded into the planning software where the target and avoidance structures were contoured.  The radiation prescription was entered and confirmed. The patient was discharged in stable condition and tolerated simulation well.    TREATMENT PLANNING NOTE:  Treatment planning then occurred. I have requested : MLC's, isodose plan, basic dose calculation

## 2011-11-02 ENCOUNTER — Telehealth (INDEPENDENT_AMBULATORY_CARE_PROVIDER_SITE_OTHER): Payer: Self-pay | Admitting: General Surgery

## 2011-11-02 NOTE — Telephone Encounter (Signed)
Pt called to say she dropped off paper work re surgery and out of work time. She is requesting to return to work on 11-07-11, and would like to include restrictions.Is it possible to include this with the forms?/gy

## 2011-11-03 ENCOUNTER — Telehealth (INDEPENDENT_AMBULATORY_CARE_PROVIDER_SITE_OTHER): Payer: Self-pay | Admitting: General Surgery

## 2011-11-03 NOTE — Telephone Encounter (Signed)
Spoke with patient. Aware Dr Jamey Ripa will be in Tuesday to sign her official note from her work. She will pick this up on Tuesday and it will be at the front for her. She works in [employer] and does not lift. Note will say to return to work on 11/14/11 with no restrictions.

## 2011-11-03 NOTE — Telephone Encounter (Signed)
This is an addendum to yesterday's telephone encounter/ I did not route yesterday's message/gy

## 2011-11-03 NOTE — Telephone Encounter (Signed)
Message copied by Liliana Cline on Fri Nov 03, 2011  2:31 PM ------      Message from: Marin Shutter      Created: Fri Nov 03, 2011  2:14 PM      Regarding: Dr. Jamey Ripa      Contact: [phone]       Pt needs a return to work note for Tues. Nov. 5. Lifting info needs to be on note. She will pick it up. Thx

## 2011-11-06 ENCOUNTER — Inpatient Hospital Stay: Admission: RE | Admit: 2011-11-06 | Payer: Self-pay | Source: Ambulatory Visit

## 2011-11-07 ENCOUNTER — Ambulatory Visit: Payer: BC Managed Care – PPO | Admitting: Radiation Oncology

## 2011-11-07 ENCOUNTER — Encounter: Payer: Self-pay | Admitting: Genetic Counselor

## 2011-11-07 ENCOUNTER — Encounter: Payer: Self-pay | Admitting: Radiation Oncology

## 2011-11-07 ENCOUNTER — Ambulatory Visit
Admission: RE | Admit: 2011-11-07 | Discharge: 2011-11-07 | Disposition: A | Discharge status: Home / Self Care | Payer: BC Managed Care – PPO | Source: Ambulatory Visit | Attending: Radiation Oncology | Admitting: Radiation Oncology

## 2011-11-08 ENCOUNTER — Ambulatory Visit: Payer: BC Managed Care – PPO

## 2011-11-08 ENCOUNTER — Ambulatory Visit
Admission: RE | Admit: 2011-11-08 | Discharge: 2011-11-08 | Disposition: A | Discharge status: Home / Self Care | Payer: BC Managed Care – PPO | Source: Ambulatory Visit | Attending: Radiation Oncology | Admitting: Radiation Oncology

## 2011-11-09 ENCOUNTER — Ambulatory Visit
Admission: RE | Admit: 2011-11-09 | Discharge: 2011-11-09 | Disposition: A | Discharge status: Home / Self Care | Payer: BC Managed Care – PPO | Source: Ambulatory Visit | Attending: Radiation Oncology | Admitting: Radiation Oncology

## 2011-11-09 ENCOUNTER — Ambulatory Visit: Payer: BC Managed Care – PPO

## 2011-11-10 ENCOUNTER — Ambulatory Visit
Admission: RE | Admit: 2011-11-10 | Discharge: 2011-11-10 | Disposition: A | Discharge status: Home / Self Care | Payer: BC Managed Care – PPO | Source: Ambulatory Visit | Attending: Radiation Oncology | Admitting: Radiation Oncology

## 2011-11-10 ENCOUNTER — Ambulatory Visit: Payer: BC Managed Care – PPO

## 2011-11-13 ENCOUNTER — Ambulatory Visit: Payer: BC Managed Care – PPO

## 2011-11-13 ENCOUNTER — Ambulatory Visit
Admission: RE | Admit: 2011-11-13 | Discharge: 2011-11-13 | Disposition: A | Discharge status: Home / Self Care | Payer: BC Managed Care – PPO | Source: Ambulatory Visit | Attending: Radiation Oncology | Admitting: Radiation Oncology

## 2011-11-14 ENCOUNTER — Ambulatory Visit
Admission: RE | Admit: 2011-11-14 | Discharge: 2011-11-14 | Disposition: A | Discharge status: Home / Self Care | Payer: BC Managed Care – PPO | Source: Ambulatory Visit | Attending: Radiation Oncology | Admitting: Radiation Oncology

## 2011-11-14 ENCOUNTER — Ambulatory Visit: Payer: BC Managed Care – PPO

## 2011-11-14 ENCOUNTER — Encounter: Payer: Self-pay | Admitting: Radiation Oncology

## 2011-11-14 VITALS — BP 100/70 | HR 84 | Resp 18 | Wt 134.4 lb

## 2011-11-14 DIAGNOSIS — C50419 Malignant neoplasm of upper-outer quadrant of unspecified female breast: Secondary | ICD-10-CM

## 2011-11-14 NOTE — Progress Notes (Signed)
[facility]    Radiation Oncology [address] [place]     Maryln Gottron, M.D. [place], [place] [address]               Billie Lade, M.D., Ph.D. Phone: [phone]      Molli Hazard A. Kathrynn Running, M.D. Fax: [phone]      Radene Gunning, M.D., Ph.D.         Lurline Hare, M.D.         Grayland Jack, M.D Weekly Treatment Management Note  Name: Carolyn [name]     MRN: [id number]        CSN: [id number] Date: 11/14/2011      DOB: 1970/03/28  CC: Maryelizabeth Rowan, MD         Duanne Guess    Status: Outpatient  Diagnosis: The encounter diagnosis was Cancer of upper-outer quadrant of Right breast.  Current Dose: 900 cGy  Current Fraction: 5  Planned Dose: 5040 cGy  Narrative: [name] was seen today for weekly treatment management. The chart was checked and port films  were reviewed. She is tolerating the treatments well at this time without any side effects.  Allegra and Shellfish allergy  Current Outpatient Prescriptions  Medication Sig Dispense Refill  . acetaminophen (TYLENOL) 500 MG tablet Take 1,000 mg by mouth every 6 (six) hours as needed.      . Cholecalciferol (VITAMIN D3) 2000 UNITS TABS Take 2,000 Units by mouth daily.      Marland Kitchen dexamethasone (DECADRON) 4 MG tablet Take 1 tablet (4 mg total) by mouth 2 (two) times daily with a meal. Two tablets twice a day the day before chemotherapy.  Two tablets twice a day for 3 days after chemotherapy  30 tablet  0  . lidocaine-prilocaine (EMLA) cream Apply topically as needed. Apply to port area 45 mins prior to treatment.  30 g  5  . lidocaine-prilocaine (EMLA) cream Apply topically as needed.  30 g  6  . Loratadine 10 MG CAPS Take 10 mg by mouth daily.      . Multiple Vitamin (MULTIVITAMIN) tablet Take 1 tablet by mouth daily.      . non-metallic deodorant Thornton Papas) MISC Apply 1 application topically daily as needed.      Marland Kitchen oxyCODONE-acetaminophen (PERCOCET/ROXICET) 5-325 MG per tablet Take 1-2 tablets by mouth every 4 (four)  hours as needed.  30 tablet  0  . Wound Cleansers ([name]) Apply topically.       Labs:  Lab Results  Component Value Date   WBC 3.9 10/06/2011   HGB 12.1 10/06/2011   HCT 35.8 10/06/2011   MCV 92.3 10/06/2011   PLT 156 10/06/2011   Lab Results  Component Value Date   CREATININE 0.8 10/06/2011   BUN 10.0 10/06/2011   NA 142 10/06/2011   K 3.5 10/06/2011   CL 104 10/06/2011   CO2 29 10/06/2011   Lab Results  Component Value Date   ALT 16 10/06/2011   AST 21 10/06/2011   BILITOT 0.50 10/06/2011    Physical Examination:  weight is 134 lb 6.4 oz (60.963 kg). Her blood pressure is 100/70 and her pulse is 84. Her respiration is 18.    Wt Readings from Last 3 Encounters:  11/14/11 134 lb 6.4 oz (60.963 kg)  10/26/11 134 lb 9.6 oz (61.054 kg)  10/18/11 136 lb 12.8 oz (62.052 kg)     Lungs - Normal respiratory effort, chest expands symmetrically. Lungs are clear to auscultation, no crackles  or wheezes.  Heart has regular rhythm and rate  Abdomen is soft and non tender with normal bowel sounds The right chest wall area shows no significant radiation reaction at this point. Assessment:  Patient tolerating treatments well  Plan: Continue treatment per original radiation prescription

## 2011-11-14 NOTE — Progress Notes (Signed)
Patient presents to the clinic today unaccompanied for PUT with Dr. Roselind Messier. Patient alert and oriented to person, place, and time. No distress noted. Steady gait noted. Pleasant affect noted. Patient denies pain at this time. No change of skin or energy level noted. Post sim education completed today. Reported all findings to Dr. Roselind Messier.

## 2011-11-15 ENCOUNTER — Ambulatory Visit
Admission: RE | Admit: 2011-11-15 | Discharge: 2011-11-15 | Disposition: A | Discharge status: Home / Self Care | Payer: BC Managed Care – PPO | Source: Ambulatory Visit | Attending: Radiation Oncology | Admitting: Radiation Oncology

## 2011-11-15 ENCOUNTER — Ambulatory Visit: Payer: BC Managed Care – PPO

## 2011-11-15 DIAGNOSIS — C50419 Malignant neoplasm of upper-outer quadrant of unspecified female breast: Secondary | ICD-10-CM

## 2011-11-15 MED ORDER — RADIAPLEXRX EX GEL
Freq: Once | CUTANEOUS | Status: AC
  Administered 2011-11-15: 17:00:00 via TOPICAL

## 2011-11-15 MED ORDER — ALRA NON-METALLIC DEODORANT (RAD-ONC)
1.0000 "application " | Freq: Once | TOPICAL | Status: AC
  Administered 2011-11-15: 1 via TOPICAL

## 2011-11-15 NOTE — Progress Notes (Signed)
Received patient in the clinic today for post sim education with Sam, RN. Patient alert and oriented to person, place, and time. No distress noted. Steady gait noted. Pleasant affect noted. Patient denies pain at this time. Oriented patient to staff and routine of the clinic. Provided patient with RADIATION THERAPY AND YOU handbook then, reviewed pertinent information. Provided patient with Radiaplex and Alra then, directed upon use. Educated patient on potential side effects and management such as, skin changes and fatigue. All questions answered. Provided patient with this writer's contact information and encouraged the patient to call with future needs. Patient verbalized understanding of all reviewed.

## 2011-11-16 ENCOUNTER — Ambulatory Visit: Payer: BC Managed Care – PPO

## 2011-11-16 ENCOUNTER — Ambulatory Visit
Admission: RE | Admit: 2011-11-16 | Discharge: 2011-11-16 | Disposition: A | Discharge status: Home / Self Care | Payer: BC Managed Care – PPO | Source: Ambulatory Visit | Attending: Radiation Oncology | Admitting: Radiation Oncology

## 2011-11-17 ENCOUNTER — Ambulatory Visit
Admission: RE | Admit: 2011-11-17 | Discharge: 2011-11-17 | Disposition: A | Discharge status: Home / Self Care | Payer: BC Managed Care – PPO | Source: Ambulatory Visit | Attending: Radiation Oncology | Admitting: Radiation Oncology

## 2011-11-17 ENCOUNTER — Ambulatory Visit: Payer: BC Managed Care – PPO

## 2011-11-20 ENCOUNTER — Ambulatory Visit: Payer: BC Managed Care – PPO

## 2011-11-20 ENCOUNTER — Ambulatory Visit
Admission: RE | Admit: 2011-11-20 | Discharge: 2011-11-20 | Disposition: A | Discharge status: Home / Self Care | Payer: BC Managed Care – PPO | Source: Ambulatory Visit | Attending: Radiation Oncology | Admitting: Radiation Oncology

## 2011-11-21 ENCOUNTER — Ambulatory Visit
Admission: RE | Admit: 2011-11-21 | Discharge: 2011-11-21 | Disposition: A | Discharge status: Home / Self Care | Payer: BC Managed Care – PPO | Source: Ambulatory Visit | Attending: Radiation Oncology | Admitting: Radiation Oncology

## 2011-11-21 ENCOUNTER — Encounter: Payer: Self-pay | Admitting: Radiation Oncology

## 2011-11-21 ENCOUNTER — Ambulatory Visit: Payer: BC Managed Care – PPO

## 2011-11-21 VITALS — BP 106/70 | HR 84 | Temp 98.1°F | Resp 20 | Wt 135.0 lb

## 2011-11-21 DIAGNOSIS — C50419 Malignant neoplasm of upper-outer quadrant of unspecified female breast: Secondary | ICD-10-CM

## 2011-11-21 NOTE — Progress Notes (Signed)
Weekly Management Note Current Dose:  16.2 Gy  Projected Dose: 60.4 Gy   Narrative:  The patient presents for routine under treatment assessment.  CBCT/MVCT images/Port film x-rays were reviewed.  The chart was checked. Feeling well. Using radiaplex  Physical Findings: Weight: 135 lb (61.236 kg). Slightly tan skin  Impression:  The patient is tolerating radiation.  Plan:  Continue treatment as planned. Continue radiaplex.

## 2011-11-21 NOTE — Progress Notes (Signed)
Patient her weekly rad txs, Right chestwall/subclavian, 9/25 completed, alert,oriented x3, no c/o pain, slight hyperpigmentation to that area, skin intact, will come the Saturday before Thanksgiving for trad tx 4:10 PM

## 2011-11-22 ENCOUNTER — Ambulatory Visit: Payer: BC Managed Care – PPO

## 2011-11-22 ENCOUNTER — Ambulatory Visit
Admission: RE | Admit: 2011-11-22 | Discharge: 2011-11-22 | Disposition: A | Discharge status: Home / Self Care | Payer: BC Managed Care – PPO | Source: Ambulatory Visit | Attending: Radiation Oncology | Admitting: Radiation Oncology

## 2011-11-23 ENCOUNTER — Ambulatory Visit: Payer: BC Managed Care – PPO

## 2011-11-23 ENCOUNTER — Ambulatory Visit
Admission: RE | Admit: 2011-11-23 | Discharge: 2011-11-23 | Disposition: A | Discharge status: Home / Self Care | Payer: BC Managed Care – PPO | Source: Ambulatory Visit | Attending: Radiation Oncology | Admitting: Radiation Oncology

## 2011-11-24 ENCOUNTER — Ambulatory Visit: Payer: BC Managed Care – PPO

## 2011-11-24 ENCOUNTER — Ambulatory Visit
Admission: RE | Admit: 2011-11-24 | Discharge: 2011-11-24 | Disposition: A | Discharge status: Home / Self Care | Payer: BC Managed Care – PPO | Source: Ambulatory Visit | Attending: Radiation Oncology | Admitting: Radiation Oncology

## 2011-11-27 ENCOUNTER — Ambulatory Visit: Payer: BC Managed Care – PPO

## 2011-11-27 ENCOUNTER — Ambulatory Visit
Admission: RE | Admit: 2011-11-27 | Discharge: 2011-11-27 | Disposition: A | Discharge status: Home / Self Care | Payer: BC Managed Care – PPO | Source: Ambulatory Visit | Attending: Radiation Oncology | Admitting: Radiation Oncology

## 2011-11-28 ENCOUNTER — Ambulatory Visit: Payer: BC Managed Care – PPO

## 2011-11-28 ENCOUNTER — Encounter: Payer: Self-pay | Admitting: Radiation Oncology

## 2011-11-28 ENCOUNTER — Ambulatory Visit
Admission: RE | Admit: 2011-11-28 | Discharge: 2011-11-28 | Disposition: A | Discharge status: Home / Self Care | Payer: BC Managed Care – PPO | Source: Ambulatory Visit | Attending: Radiation Oncology | Admitting: Radiation Oncology

## 2011-11-28 VITALS — BP 110/71 | HR 78 | Temp 98.0°F | Resp 20 | Wt 132.8 lb

## 2011-11-28 DIAGNOSIS — C50419 Malignant neoplasm of upper-outer quadrant of unspecified female breast: Secondary | ICD-10-CM

## 2011-11-28 NOTE — Progress Notes (Signed)
Pt denies pain, loss of appetite, fatigue.

## 2011-11-28 NOTE — Progress Notes (Signed)
Weekly Management Note Current Dose: 14 fractions  Projected Dose:  33 fractions   Narrative:  The patient presents for routine under treatment assessment.  CBCT/MVCT images/Port film x-rays were reviewed.  The chart was checked. Doing well. Some chest wall soreness particularly inferiorly.   Physical Findings: Weight: 132 lb 12.8 oz (60.238 kg). Dark right chest wall. No desquamation.   Impression:  The patient is tolerating radiation.  Plan:  Continue treatment as planned. Continue radiaplex. OK to use anti-inflammatories for pain.

## 2011-11-29 ENCOUNTER — Ambulatory Visit
Admission: RE | Admit: 2011-11-29 | Discharge: 2011-11-29 | Disposition: A | Discharge status: Home / Self Care | Payer: BC Managed Care – PPO | Source: Ambulatory Visit | Attending: Radiation Oncology | Admitting: Radiation Oncology

## 2011-11-29 ENCOUNTER — Ambulatory Visit: Payer: BC Managed Care – PPO

## 2011-11-30 ENCOUNTER — Ambulatory Visit: Payer: BC Managed Care – PPO

## 2011-11-30 ENCOUNTER — Ambulatory Visit
Admission: RE | Admit: 2011-11-30 | Discharge: 2011-11-30 | Disposition: A | Discharge status: Home / Self Care | Payer: BC Managed Care – PPO | Source: Ambulatory Visit | Attending: Radiation Oncology | Admitting: Radiation Oncology

## 2011-12-01 ENCOUNTER — Ambulatory Visit: Payer: BC Managed Care – PPO

## 2011-12-01 ENCOUNTER — Ambulatory Visit
Admission: RE | Admit: 2011-12-01 | Discharge: 2011-12-01 | Disposition: A | Discharge status: Home / Self Care | Payer: BC Managed Care – PPO | Source: Ambulatory Visit | Attending: Radiation Oncology | Admitting: Radiation Oncology

## 2011-12-02 ENCOUNTER — Ambulatory Visit
Admission: RE | Admit: 2011-12-02 | Discharge: 2011-12-02 | Disposition: A | Discharge status: Home / Self Care | Payer: BC Managed Care – PPO | Source: Ambulatory Visit | Attending: Radiation Oncology | Admitting: Radiation Oncology

## 2011-12-04 ENCOUNTER — Ambulatory Visit: Payer: BC Managed Care – PPO

## 2011-12-04 ENCOUNTER — Ambulatory Visit
Admission: RE | Admit: 2011-12-04 | Discharge: 2011-12-04 | Disposition: A | Discharge status: Home / Self Care | Payer: BC Managed Care – PPO | Source: Ambulatory Visit | Attending: Radiation Oncology | Admitting: Radiation Oncology

## 2011-12-05 ENCOUNTER — Ambulatory Visit: Payer: BC Managed Care – PPO

## 2011-12-05 ENCOUNTER — Encounter: Payer: Self-pay | Admitting: Radiation Oncology

## 2011-12-05 ENCOUNTER — Ambulatory Visit
Admission: RE | Admit: 2011-12-05 | Discharge: 2011-12-05 | Disposition: A | Discharge status: Home / Self Care | Payer: BC Managed Care – PPO | Source: Ambulatory Visit | Attending: Radiation Oncology | Admitting: Radiation Oncology

## 2011-12-05 VITALS — BP 107/72 | HR 77 | Resp 18 | Wt 136.4 lb

## 2011-12-05 DIAGNOSIS — C50419 Malignant neoplasm of upper-outer quadrant of unspecified female breast: Secondary | ICD-10-CM

## 2011-12-05 NOTE — Progress Notes (Signed)
Patient presents to the clinic today unaccompanied for PUT with Dr. Michell Heinrich. Patient is alert and oriented to person, place, and time. No distress noted. Steady gait noted. Pleasant affect noted. Patient denies pain at this time. Hyperpigmentation of right/treated breast noted. Patient reports using radiaplex as directed. Patient denies decreased ROM or edema of right arm. Patient denies decline of energy. Patient denies complaints.  Reported all findings to Dr. Michell Heinrich.

## 2011-12-05 NOTE — Progress Notes (Signed)
Weekly Management Note Current Dose: 36  Gy  Projected Dose: 60.4 Gy   Narrative:  The patient presents for routine under treatment assessment.  CBCT/MVCT images/Port film x-rays were reviewed.  The chart was checked. Doing well. No complaints.   Physical Findings: Weight: 136 lb 6.4 oz (61.871 kg). Unchanged. Dark right chest wall.  Impression:  The patient is tolerating radiation.  Plan:  Continue treatment as planned. Continue radiaplex.

## 2011-12-06 ENCOUNTER — Ambulatory Visit
Admission: RE | Admit: 2011-12-06 | Discharge: 2011-12-06 | Disposition: A | Discharge status: Home / Self Care | Payer: BC Managed Care – PPO | Source: Ambulatory Visit | Attending: Radiation Oncology | Admitting: Radiation Oncology

## 2011-12-06 ENCOUNTER — Ambulatory Visit: Payer: BC Managed Care – PPO

## 2011-12-08 ENCOUNTER — Ambulatory Visit: Payer: BC Managed Care – PPO

## 2011-12-11 ENCOUNTER — Ambulatory Visit
Admission: RE | Admit: 2011-12-11 | Discharge: 2011-12-11 | Disposition: A | Discharge status: Home / Self Care | Payer: BC Managed Care – PPO | Source: Ambulatory Visit | Attending: Radiation Oncology | Admitting: Radiation Oncology

## 2011-12-11 ENCOUNTER — Ambulatory Visit: Payer: BC Managed Care – PPO

## 2011-12-12 ENCOUNTER — Ambulatory Visit
Admission: RE | Admit: 2011-12-12 | Discharge: 2011-12-12 | Disposition: A | Discharge status: Home / Self Care | Payer: BC Managed Care – PPO | Source: Ambulatory Visit | Attending: Radiation Oncology | Admitting: Radiation Oncology

## 2011-12-12 ENCOUNTER — Ambulatory Visit: Payer: BC Managed Care – PPO

## 2011-12-12 ENCOUNTER — Encounter: Payer: Self-pay | Admitting: *Deleted

## 2011-12-12 DIAGNOSIS — C50419 Malignant neoplasm of upper-outer quadrant of unspecified female breast: Secondary | ICD-10-CM

## 2011-12-12 NOTE — Progress Notes (Signed)
Weekly Management Note Current Dose: 41.4  Gy  Projected Dose: 60.4 Gy   Narrative:  The patient presents for routine under treatment assessment.  CBCT/MVCT images/Port film x-rays were reviewed.  The chart was checked. Sore under arm. Using radiaplex.   Physical Findings: Dark on chest wall. Dry desquamation (small) under right arm.   Impression:  The patient is tolerating radiation.  Plan:  Continue treatment as planned. Continue radiaplex. Marked out electron scar boost on machine.

## 2011-12-12 NOTE — Progress Notes (Signed)
Patient given hydrogel pads (5), and 2 mesh tube tops with instructions of use of pads,to place in refrigerator not freezer, can use each gel pad 3-4 days when gets gritty throw away and use new one, patient knows to wash mesh tubing with warm soap/water air dry, alternate daily,teach back by patient. Md saw patient in back 2:30 PM

## 2011-12-13 ENCOUNTER — Ambulatory Visit: Payer: BC Managed Care – PPO

## 2011-12-13 ENCOUNTER — Ambulatory Visit
Admission: RE | Admit: 2011-12-13 | Discharge: 2011-12-13 | Disposition: A | Discharge status: Home / Self Care | Payer: BC Managed Care – PPO | Source: Ambulatory Visit | Attending: Radiation Oncology | Admitting: Radiation Oncology

## 2011-12-14 ENCOUNTER — Encounter: Payer: Self-pay | Admitting: Radiation Oncology

## 2011-12-14 ENCOUNTER — Ambulatory Visit
Admission: RE | Admit: 2011-12-14 | Discharge: 2011-12-14 | Disposition: A | Discharge status: Home / Self Care | Payer: BC Managed Care – PPO | Source: Ambulatory Visit | Attending: Radiation Oncology | Admitting: Radiation Oncology

## 2011-12-14 ENCOUNTER — Ambulatory Visit: Payer: BC Managed Care – PPO

## 2011-12-14 ENCOUNTER — Telehealth: Payer: Self-pay | Admitting: *Deleted

## 2011-12-14 NOTE — Telephone Encounter (Signed)
moved appointments up per the patient getting radiation at 3:20pm  Patient confirmed in person

## 2011-12-15 ENCOUNTER — Ambulatory Visit: Payer: BC Managed Care – PPO

## 2011-12-15 ENCOUNTER — Other Ambulatory Visit: Payer: BC Managed Care – PPO | Admitting: Lab

## 2011-12-15 ENCOUNTER — Ambulatory Visit (HOSPITAL_BASED_OUTPATIENT_CLINIC_OR_DEPARTMENT_OTHER): Payer: BC Managed Care – PPO | Admitting: Oncology

## 2011-12-15 ENCOUNTER — Ambulatory Visit: Payer: BC Managed Care – PPO | Admitting: Oncology

## 2011-12-15 ENCOUNTER — Other Ambulatory Visit (HOSPITAL_BASED_OUTPATIENT_CLINIC_OR_DEPARTMENT_OTHER): Payer: BC Managed Care – PPO | Admitting: Lab

## 2011-12-15 ENCOUNTER — Encounter: Payer: Self-pay | Admitting: Oncology

## 2011-12-15 ENCOUNTER — Telehealth: Payer: Self-pay | Admitting: Oncology

## 2011-12-15 ENCOUNTER — Ambulatory Visit
Admission: RE | Admit: 2011-12-15 | Discharge: 2011-12-15 | Disposition: A | Discharge status: Home / Self Care | Payer: BC Managed Care – PPO | Source: Ambulatory Visit | Attending: Radiation Oncology | Admitting: Radiation Oncology

## 2011-12-15 VITALS — BP 100/68 | HR 72 | Temp 98.9°F | Resp 20 | Ht 68.0 in | Wt 135.4 lb

## 2011-12-15 DIAGNOSIS — C50419 Malignant neoplasm of upper-outer quadrant of unspecified female breast: Secondary | ICD-10-CM

## 2011-12-15 DIAGNOSIS — C773 Secondary and unspecified malignant neoplasm of axilla and upper limb lymph nodes: Secondary | ICD-10-CM

## 2011-12-15 DIAGNOSIS — Z170 Estrogen receptor positive status [ER+]: Secondary | ICD-10-CM

## 2011-12-15 LAB — CBC WITH DIFFERENTIAL/PLATELET
Basophils Absolute: 0 10*3/uL (ref 0.0–0.1)
EOS%: 1.6 % (ref 0.0–7.0)
HCT: 38.3 % (ref 34.8–46.6)
HGB: 13.2 g/dL (ref 11.6–15.9)
MCH: 30.6 pg (ref 25.1–34.0)
MCV: 89.3 fL (ref 79.5–101.0)
MONO%: 8.8 % (ref 0.0–14.0)
NEUT%: 74 % (ref 38.4–76.8)

## 2011-12-15 LAB — COMPREHENSIVE METABOLIC PANEL (CC13)
AST: 24 U/L (ref 5–34)
BUN: 15 mg/dL (ref 7.0–26.0)
Calcium: 9.7 mg/dL (ref 8.4–10.4)
Chloride: 104 mEq/L (ref 98–107)
Creatinine: 0.9 mg/dL (ref 0.6–1.1)

## 2011-12-15 MED ORDER — RADIAPLEXRX EX GEL
Freq: Once | CUTANEOUS | Status: AC
  Administered 2011-12-15: 1 via TOPICAL

## 2011-12-15 NOTE — Telephone Encounter (Signed)
Gave pt appt for 01/16/12.

## 2011-12-15 NOTE — Patient Instructions (Addendum)
Finish up with radiation therapy  We discussed prophylactic mastectomy of the left breast and bilateral ovaries and fallopian tube removal sometime next year

## 2011-12-15 NOTE — Progress Notes (Signed)
OFFICE PROGRESS NOTE  CC  Carolyn [name],[name], MD [address], [address] [place] [address] Dr. Cyndia Bent Dr. 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 She had an ultrasound that revealed an area ofconcern measuring 2.9 cm. She also was found to have a right axillary lymph node that was suspicious for malignancy. Patient went on to have biopsy of the calcifications that showed high-grade ductal carcinoma in situ. Biopsy of the right axillary lymph node revealed an invasive ductal carcinoma. The tumor was ER positive PR positive HER-2/neu negative with a Ki-67 of 14%.    #2 on MRI patient was also found to have other areas of concern in the contralateral breast. Also was noted that in the right breast the tumor abutted the chest wall.since then patient has gone on to have biopsies performed at the other areas that are negative for a malignancy. The pathology  is shown below.  #3 patient was begun on neoadjuvant chemotherapy consisting of Taxotere and Cytoxan on 06/27/2011 - 09/04/11  #4 patient is now status post mastectomy but did show residual disease measuring 1.1 cm with 3 lymph nodes positive for metastatic disease.  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 female returns for followup  visit. Briefly patient is doing well. We discussed the genetic results today. She denies any nausea vomiting fevers chills night sweats headaches no shortness of breath or chest pains she is having pain at the incisional site. She does have significant darkening of the skin from radiation. She will be finishing radiation in 12/26/2011. Remainder of the review of systems is unremarkable.  MEDICAL HISTORY: Past Medical History  Diagnosis Date  . Breast cancer     invasive ductal carcinoma metastatic ca in 3 of 14 lymph nodes    ALLERGIES:  is allergic to allegra and shellfish allergy.  MEDICATIONS:  Current Outpatient Prescriptions  Medication Sig Dispense Refill  . acetaminophen (TYLENOL) 500 MG tablet Take 1,000 mg by mouth every 6 (six) hours as needed.      . Cholecalciferol (VITAMIN D3) 2000 UNITS TABS Take 2,000 Units by mouth daily.      Marland Kitchen dexamethasone (DECADRON) 4 MG tablet Take 1 tablet (4 mg total) by mouth 2 (two) times daily with a meal. Two tablets twice a day the day before chemotherapy.  Two tablets twice a day for 3 days after chemotherapy  30 tablet  0  . lidocaine-prilocaine (EMLA) cream Apply topically as needed. Apply to port area 45 mins prior to treatment.  30 g  5  . lidocaine-prilocaine (EMLA) cream Apply topically as needed.  30 g  6  . Loratadine 10 MG CAPS Take 10 mg by mouth daily.      . Multiple Vitamin (MULTIVITAMIN) tablet Take 1 tablet by mouth daily.      . non-metallic deodorant Thornton Papas) MISC Apply 1 application topically daily as  needed.      Marland Kitchen oxyCODONE-acetaminophen (PERCOCET/ROXICET) 5-325 MG per tablet Take 1-2 tablets by mouth every 4 (four) hours as needed.  30 tablet  0  . Wound Cleansers ([name]) Apply topically.        SURGICAL HISTORY:  Past Surgical History  Procedure Date  . Wisdom tooth extraction   . Breast biopsy     Left  . Portacath placement 06/20/2011    Procedure: INSERTION PORT-A-CATH;  Surgeon: [name], MD;   Location: [facility] OR;  Service: General;  Laterality: N/A;  . Modified mastectomy 10/03/2011    Procedure: MODIFIED MASTECTOMY;  Surgeon: [name], MD;  Location: [facility];  Service: General;  Laterality: Right;    REVIEW OF SYSTEMS:  Pertinent items are noted in HPI.   PHYSICAL EXAMINATION: General appearance: alert, cooperative and appears stated age Lymph nodes: Cervical, supraclavicular, and axillary nodes normal. Resp: clear to auscultation bilaterally and normal percussion bilaterally Back: symmetric, no curvature. ROM normal. No CVA tenderness. Cardio: regular rate and rhythm, S1, S2 normal, no murmur, click, rub or gallop GI: soft, non-tender; bowel sounds normal; no masses,  no organomegaly Extremities: extremities normal, atraumatic, no cyanosis or edema Neurologic: Grossly normal  ECOG PERFORMANCE STATUS: 0 - Asymptomatic  Blood pressure 100/68, pulse 72, temperature 98.9 F (37.2 C), temperature source Oral, resp. rate 20, height 5\' 8"  (1.727 m), weight 135 lb 6.4 oz (61.417 kg).  LABORATORY DATA: Lab Results  Component Value Date   WBC 3.7* 12/15/2011   HGB 13.2 12/15/2011   HCT 38.3 12/15/2011   MCV 89.3 12/15/2011   PLT 139* 12/15/2011      Chemistry      Component Value Date/Time   NA 144 12/15/2011 1355   NA 141 09/29/2011 1430   K 3.9 12/15/2011 1355   K 3.8 09/29/2011 1430   CL 104 12/15/2011 1355   CL 101 09/29/2011 1430   CO2 31* 12/15/2011 1355   CO2 30 09/29/2011 1430   BUN 15.0 12/15/2011 1355   BUN 12 09/29/2011 1430   CREATININE 0.9 12/15/2011 1355   CREATININE 0.80 09/29/2011 1430      Component Value Date/Time   CALCIUM 9.7 12/15/2011 1355   CALCIUM 10.4 09/29/2011 1430   ALKPHOS 149 12/15/2011 1355   ALKPHOS 101 09/29/2011 1430   AST 24 12/15/2011 1355   AST 25 09/29/2011 1430   ALT 25 12/15/2011 1355   ALT 21 09/29/2011 1430   BILITOT 0.44 12/15/2011 1355   BILITOT 0.3 09/29/2011 1430     FINAL DIAGNOSIS Diagnosis 1. Breast,  modified radical mastectomy , Right - INVASIVE DUCTAL CARCINOMA, GRADE I / III, SPANNING 1.1 CM. - DUCTAL CARCINOMA IN SITU WITH CALCIFICATIONS, LOW GRADE. - FIBROADENOMA. - METASTATIC CARCINOMA IN 3 OF 14 LYMPH NODES (3/14). - THE SURGICAL RESECTION MARGINS ARE NEGATIVE FOR CARCINOMA. - SEE ONCOLOGY TABLE BELOW. 2. Lymph nodes, regional resection, Right axilla - THERE IS NO EVIDENCE OF CARCINOMA IN 4 OF 4 LYMPH NODES (0/4). Microscopic Comment 1. BREAST, INVASIVE TUMOR, WITH LYMPH NODE SAMPLING Specimen, including laterality: Right breast. Procedure: Modified radical mastectomy. Grade: I. Tubule formation: 2. Nuclear pleomorphism: 1. Mitotic: 1. Tumor size (glass slide measurement): 1.1 cm. Margins: Negative for carcinoma. Invasive, distance to closest margin: 2.0 cm to the deep margin (gross measurement). In-situ, distance to closest margin: 2.0 cm to the deep margin (gross measurement). Lymphovascular invasion: Not identified. Ductal carcinoma in situ: Present. Grade: Low grade. Extensive intraductal component: Yes.  Lobular neoplasia: Not identified. Tumor focality: Unifocal. Treatment effect: None to minimal. Extent of tumor: Confined to breast parenchyma. Lymph nodes: # examined: 18. 1 of 3 FINAL for Chisholm, Cherene C ([id number]) Microscopic Comment(continued) Lymph nodes with metastasis: 3. Isolated tumor cells (< 0.2 mm): 0. Micrometastasis: (> 0.2 mm and < 2.0 mm): 0. Macrometastasis: (> 2.0 mm): 3. Extracapsular extension: Not identified. Breast prognostic profile: Will be repeated on the current case and results reported separately. TNM: ypT1c, ypN1c. (JBK:eps 10/05/11) Pecola Leisure MD Pathologist, Electronic Signature (Case signed 10/05/2011) Spec RADIOGRAPHIC STUDIES:   ASSESSMENT: 41 year old female with  #1 stage IIB invasive ductal carcinoma of the right breast. Patient presented with calcifications in the right breast with right axillary lymph node that  was positive for invasive ductal carcinoma. The area of concern in the right buttocks the underlying muscle and therefore patient is being offered neoadjuvant chemotherapy. Patient and I have discussed treatment with Taxotere and Cytoxan. Plan is to get give her about 6 cycles of treatment.  #2 patient is now status post mastectomy with the final pathology revealing residual 1.1 cm invasive ductal carcinoma with DCIS grade 13 of 18 lymph nodes positive for metastatic disease tumor was ER positive PR positive HER-2/neu negative. Patient and I discussed her final pathology.  PLAN:  #1 patient will continue receiving her radiation therapy she will be completing radiation on 12/26/2011.  #2 patient and I discussed the results of her genetic testing. She has also met with Maylon Cos our genetic counselor regarding her positive results for  RAD 51C mutation associated with increased risk for breast and ovarian cancer. 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 We discussed prophylactic left mastectomy and bilateral salpingo-oophorectomies. She is very much interested in this but would like to have this next year some time. She wants to get over her active treatments at this point.  #3 I will plan on seeing the patient back in January 2014. At that time she will begin tamoxifen and will also refer her back to Dr. Jamey Ripa as well as her gynecologist for prophylactic surgeries.  All questions were answered. The patient knows to call the clinic with any problems, questions or concerns. We can certainly see the patient much sooner if necessary.  I spent 40 minutes counseling the patient face to face. The total time spent in the appointment was 30 minutes.    Drue Second, MD Medical/Oncology [facility] [place] [phone] (beeper) [phone] (Office)

## 2011-12-18 ENCOUNTER — Ambulatory Visit
Admission: RE | Admit: 2011-12-18 | Discharge: 2011-12-18 | Disposition: A | Discharge status: Home / Self Care | Payer: BC Managed Care – PPO | Source: Ambulatory Visit | Attending: Radiation Oncology | Admitting: Radiation Oncology

## 2011-12-18 ENCOUNTER — Ambulatory Visit: Payer: BC Managed Care – PPO

## 2011-12-19 ENCOUNTER — Encounter: Payer: Self-pay | Admitting: Radiation Oncology

## 2011-12-19 ENCOUNTER — Ambulatory Visit
Admission: RE | Admit: 2011-12-19 | Discharge: 2011-12-19 | Disposition: A | Discharge status: Home / Self Care | Payer: BC Managed Care – PPO | Source: Ambulatory Visit | Attending: Radiation Oncology | Admitting: Radiation Oncology

## 2011-12-19 ENCOUNTER — Ambulatory Visit: Payer: BC Managed Care – PPO

## 2011-12-19 VITALS — BP 103/69 | HR 78 | Resp 18 | Wt 137.3 lb

## 2011-12-19 DIAGNOSIS — C50419 Malignant neoplasm of upper-outer quadrant of unspecified female breast: Secondary | ICD-10-CM

## 2011-12-19 NOTE — Progress Notes (Signed)
Weekly Management Note Current Dose:  50.4 Gy  Projected Dose: 60.4 Gy   Narrative:  The patient presents for routine under treatment assessment.  CBCT/MVCT images/Port film x-rays were reviewed.  The chart was checked. Doing well. Soreness in the axilla. Using radiaplex and hydrogel.   Physical Findings: Weight: 137 lb 4.8 oz (62.279 kg). Unchanged. Dark skin. No break down  Impression:  The patient is tolerating radiation.  Plan:  Continue treatment as planned. Switch to biafene. Starts boost tomorrow.

## 2011-12-19 NOTE — Progress Notes (Signed)
Also, patient reports using hydrogel pads on affect skin of right breast and feeling relief.

## 2011-12-19 NOTE — Progress Notes (Signed)
Patient presents to the clinic today unaccompanied for PUT with Dr. Michell Heinrich. Patient alert and oriented to person, place, and time. No distress noted. Steady gait noted. Pleasant affect noted. Patient denies pain at this time. Hyperpigmentation with mild desquamation of right upper chest wall and right lower chest wall. Patient reports using radiaplex as directed. Patient reports the areas of desquamation are itchy. Provided patient with biafine cream to use instead of radiaplex then, reinforced skin care. Patient verbalized understanding. Patient has no other complaints at this time. Reported all findings to Dr. Michell Heinrich.

## 2011-12-20 ENCOUNTER — Ambulatory Visit: Payer: BC Managed Care – PPO

## 2011-12-20 ENCOUNTER — Ambulatory Visit
Admission: RE | Admit: 2011-12-20 | Discharge: 2011-12-20 | Disposition: A | Discharge status: Home / Self Care | Payer: BC Managed Care – PPO | Source: Ambulatory Visit | Attending: Radiation Oncology | Admitting: Radiation Oncology

## 2011-12-21 ENCOUNTER — Ambulatory Visit: Payer: BC Managed Care – PPO

## 2011-12-21 ENCOUNTER — Ambulatory Visit
Admission: RE | Admit: 2011-12-21 | Discharge: 2011-12-21 | Disposition: A | Discharge status: Home / Self Care | Payer: BC Managed Care – PPO | Source: Ambulatory Visit | Attending: Radiation Oncology | Admitting: Radiation Oncology

## 2011-12-22 ENCOUNTER — Ambulatory Visit: Payer: BC Managed Care – PPO

## 2011-12-22 ENCOUNTER — Ambulatory Visit
Admission: RE | Admit: 2011-12-22 | Discharge: 2011-12-22 | Disposition: A | Discharge status: Home / Self Care | Payer: BC Managed Care – PPO | Source: Ambulatory Visit | Attending: Radiation Oncology | Admitting: Radiation Oncology

## 2011-12-25 ENCOUNTER — Ambulatory Visit: Payer: BC Managed Care – PPO

## 2011-12-25 ENCOUNTER — Ambulatory Visit
Admission: RE | Admit: 2011-12-25 | Discharge: 2011-12-25 | Disposition: A | Discharge status: Home / Self Care | Payer: BC Managed Care – PPO | Source: Ambulatory Visit | Attending: Radiation Oncology | Admitting: Radiation Oncology

## 2011-12-26 ENCOUNTER — Ambulatory Visit: Payer: BC Managed Care – PPO

## 2011-12-26 ENCOUNTER — Ambulatory Visit
Admission: RE | Admit: 2011-12-26 | Discharge: 2011-12-26 | Disposition: A | Discharge status: Home / Self Care | Payer: BC Managed Care – PPO | Source: Ambulatory Visit | Attending: Radiation Oncology | Admitting: Radiation Oncology

## 2011-12-26 ENCOUNTER — Encounter: Payer: Self-pay | Admitting: Radiation Oncology

## 2011-12-26 VITALS — BP 95/79 | HR 89 | Resp 18 | Wt 136.3 lb

## 2011-12-26 DIAGNOSIS — C50419 Malignant neoplasm of upper-outer quadrant of unspecified female breast: Secondary | ICD-10-CM

## 2011-12-26 MED ORDER — RADIAPLEXRX EX GEL
Freq: Once | CUTANEOUS | Status: AC
  Administered 2011-12-26: 17:00:00 via TOPICAL

## 2011-12-26 MED ORDER — BIAFINE EX EMUL
Freq: Once | CUTANEOUS | Status: AC
  Administered 2011-12-26: 17:00:00 via TOPICAL

## 2011-12-26 NOTE — Addendum Note (Signed)
Encounter addended by: Agnes Lawrence, RN on: 12/26/2011  4:32 PM<BR>     Documentation filed: Inpatient MAR

## 2011-12-26 NOTE — Progress Notes (Signed)
Weekly Management Note Current Dose: 60.4  Gy  Projected Dose: 60.4 Gy   Narrative:  The patient presents for routine under treatment assessment.  CBCT/MVCT images/Port film x-rays were reviewed.  The chart was checked. Doing well. Using bifene and radiaplex. No complaints. Appt with med onc [name] 7  Physical Findings: Weight: 136 lb 4.8 oz (61.825 kg). Unchanged. Dark skin. No moist desquamation.   Impression:  The patient finishes RT today.   Plan:  F/u in 1 month. [facility] information given.

## 2011-12-26 NOTE — Progress Notes (Signed)
Patient presents to the clinic today for PUT with Dr. Michell Heinrich following final treatment. Patient alert and oriented to person, place, and time. No distress noted. Steady gait noted. Pleasant affect noted. Patient denies pain at this time. Hyperpigmentation of right chest wall noted without desquamation. Patient reports using biafine and radiaplex bid. Provided patient with additional tubes of these products. Reviewed and reinforced skin care for the next two weeks and patient verbalized understanding. Provided patient with appointment card to return in one month for follow up but, encouraged to call with needs. Patient verbalized understanding. Provided patient with [facility] and [facility] flyers then, reviewed material. Patient reports fatigue but, understands it will improved gradually now that treatment is complete. Reported all findings to Dr. Michell Heinrich.

## 2011-12-27 ENCOUNTER — Ambulatory Visit: Payer: BC Managed Care – PPO

## 2011-12-28 ENCOUNTER — Ambulatory Visit: Payer: BC Managed Care – PPO

## 2011-12-28 NOTE — Progress Notes (Signed)
  Radiation Oncology         [phone]) [phone] ________________________________  Name: Carolyn [name] MRN: [id number]  Date: 12/26/2011  DOB: [birth year]  End of Treatment Note  Diagnosis:  T3 N1 invasive ductal carcinoma of the right breast.   Indication for treatment:  Curative       Radiation treatment dates:   11/08/2011-12/26/2011  Site/dose:    Right chest wall / 50.4 Gray @ 1.8 Wallace Cullens per fraction x 28 fractions Right Supraclavicular fossa / 45 Gray @1 .8 Wallace Cullens per fraction x 25 fractions Right scar / 10 Gray at [employer] per fraction x 5 fractions  Beams/energy:  Opposed Tangents / 6 MV photons Left anterior oblique / 6 MV photons En face / 6 MeV electrons  Narrative: The patient tolerated radiation treatment relatively well.   She had dry desquamation and darkening of her right chest wall. She otherwise did well.  Plan: The patient has completed radiation treatment. The patient will return to radiation oncology clinic for routine followup in one month. I advised them to call or return sooner if they have any questions or concerns related to their recovery or treatment.  ------------------------------------------------  Lurline Hare, MD

## 2011-12-28 NOTE — Progress Notes (Signed)
  Radiation Oncology         [phone]) [phone] ________________________________  Name: Carolyn [name] MRN: [id number]  Date: 11/07/2011  DOB: 07-07-1970  Simulation Verification Note  Status: outpatient  NARRATIVE: The patient was brought to the treatment unit and placed in the planned treatment position. The clinical setup was verified. Then port films were obtained and uploaded to the radiation oncology medical record software.  The treatment beams were carefully compared against the planned radiation fields. The position location and shape of the radiation fields was reviewed. The targeted volume of tissue appears appropriately covered by the radiation beams. Organs at risk appear to be excluded as planned.  Based on my personal review, I approved the simulation verification. The patient's treatment will proceed as planned.  ------------------------------------------------  Lurline Hare, MD

## 2011-12-28 NOTE — Progress Notes (Signed)
Name: Carolyn [name]   MRN: [id number]  Date:  12/14/2011    DOB: [birth year]  Status:outpatient    DIAGNOSIS: Breast cancer.  CONSENT VERIFIED: yes   SET UP: Patient is setup supine   IMMOBILIZATION:  The following immobilization was used:Custom Moldable Pillow, breast board.   NARRATIVE: Carolyn [name] underwent complex simulation and treatment planning for her boost treatment today.  Her tumor volume was outlined on the planning CT scan. The depth of her cavity was  Cm.    6  MeV electrons will be prescribed to the 90% Isodose line. 0.5 cm bolus will be placed daily.   A block will be used for beam modification purposes.  A special port plan is requested.

## 2011-12-29 ENCOUNTER — Ambulatory Visit (INDEPENDENT_AMBULATORY_CARE_PROVIDER_SITE_OTHER): Payer: BC Managed Care – PPO | Admitting: Surgery

## 2011-12-29 ENCOUNTER — Ambulatory Visit: Payer: BC Managed Care – PPO

## 2011-12-29 VITALS — BP 100/60 | HR 78 | Temp 97.6°F | Resp 18 | Ht 68.0 in | Wt 137.0 lb

## 2011-12-29 DIAGNOSIS — Z9889 Other specified postprocedural states: Secondary | ICD-10-CM

## 2011-12-29 DIAGNOSIS — C50419 Malignant neoplasm of upper-outer quadrant of unspecified female breast: Secondary | ICD-10-CM

## 2011-12-29 NOTE — Patient Instructions (Signed)
See me again in about 3 months. We can remove her Port-A-Cath under local anesthesia, but that can also be deferred until you have your left mastectomy youwould not have to have an extra procedure done.

## 2011-12-29 NOTE — Progress Notes (Signed)
NAME: [name]       DOB: 05/04/1970           DATE: 12/29/2011       MRN: [id number]   Carolyn [name] is a 41 y.o.Marland Kitchenfemale who presents for routine followup of her Right breast cancer,IDC, Stage II,  Receptor +, Her2 -, diagnosed in May, 2013  and treated with neoadjuvant chemo, MRM, and radiation. She has no problems or concerns on either side.she plans in the future to have a prophylactic left mastectomy as well as having her ovaries removed. She has just finished radiation therapy a few days ago.  PFSH: She has had no significant changes since the last visit here.  ROS: There have been no significant changes since the last visit here  EXAM:  VS: BP 100/60  Pulse 78  Temp 97.6 F (36.4 C)  Resp 18  Ht 5\' 8"  (1.727 m)  Wt 137 lb (62.143 kg)  BMI 20.83 kg/m2  General: The patient is alert, oriented, generally healthy appearing, NAD. Mood and affect are normal.  Breasts:  the right breast is surgically absent with acute radiation changes still present. There is no evidence of recurrence. The left breast is unremarkable.  Lymphatics: She has no axillary or supraclavicular adenopathy on either side.  Extremities: Full ROM of the surgical side with no lymphedema noted.  Data Reviewed: I see notes from the radiation therapist.  Impression: Doing well, with no evidence of recurrent cancer or new cancer  Plan: I will see her back here in about 3 months. I told her the port could be removed under local anesthesia, or, if she is going to have a prophylactic left mastectomy it could be removed at that time. She will be discussing this with her medical oncologist early next year.

## 2012-01-01 ENCOUNTER — Ambulatory Visit: Payer: BC Managed Care – PPO

## 2012-01-02 ENCOUNTER — Ambulatory Visit: Payer: BC Managed Care – PPO

## 2012-01-04 ENCOUNTER — Ambulatory Visit: Payer: BC Managed Care – PPO

## 2012-01-05 ENCOUNTER — Ambulatory Visit: Payer: BC Managed Care – PPO

## 2012-01-16 ENCOUNTER — Telehealth (INDEPENDENT_AMBULATORY_CARE_PROVIDER_SITE_OTHER): Payer: Self-pay | Admitting: General Surgery

## 2012-01-16 ENCOUNTER — Encounter: Payer: Self-pay | Admitting: Oncology

## 2012-01-16 ENCOUNTER — Other Ambulatory Visit (HOSPITAL_BASED_OUTPATIENT_CLINIC_OR_DEPARTMENT_OTHER): Payer: BC Managed Care – PPO | Admitting: Lab

## 2012-01-16 ENCOUNTER — Ambulatory Visit (HOSPITAL_BASED_OUTPATIENT_CLINIC_OR_DEPARTMENT_OTHER): Payer: BC Managed Care – PPO | Admitting: Oncology

## 2012-01-16 ENCOUNTER — Telehealth: Payer: Self-pay | Admitting: Oncology

## 2012-01-16 VITALS — BP 112/74 | HR 84 | Temp 97.9°F | Resp 20 | Ht 68.0 in | Wt 137.9 lb

## 2012-01-16 DIAGNOSIS — C773 Secondary and unspecified malignant neoplasm of axilla and upper limb lymph nodes: Secondary | ICD-10-CM

## 2012-01-16 DIAGNOSIS — C50419 Malignant neoplasm of upper-outer quadrant of unspecified female breast: Secondary | ICD-10-CM

## 2012-01-16 DIAGNOSIS — Z1502 Genetic susceptibility to malignant neoplasm of ovary: Secondary | ICD-10-CM

## 2012-01-16 DIAGNOSIS — Z1501 Genetic susceptibility to malignant neoplasm of breast: Secondary | ICD-10-CM

## 2012-01-16 LAB — CBC WITH DIFFERENTIAL/PLATELET
Basophils Absolute: 0 10*3/uL (ref 0.0–0.1)
EOS%: 3.6 % (ref 0.0–7.0)
Eosinophils Absolute: 0.1 10*3/uL (ref 0.0–0.5)
HCT: 38.2 % (ref 34.8–46.6)
HGB: 13.2 g/dL (ref 11.6–15.9)
MCH: 30.6 pg (ref 25.1–34.0)
MONO#: 0.2 10*3/uL (ref 0.1–0.9)
NEUT%: 60.2 % (ref 38.4–76.8)
lymph#: 0.7 10*3/uL — ABNORMAL LOW (ref 0.9–3.3)

## 2012-01-16 LAB — COMPREHENSIVE METABOLIC PANEL (CC13)
BUN: 8 mg/dL (ref 7.0–26.0)
CO2: 31 mEq/L — ABNORMAL HIGH (ref 22–29)
Calcium: 10.4 mg/dL (ref 8.4–10.4)
Chloride: 102 mEq/L (ref 98–107)
Creatinine: 0.9 mg/dL (ref 0.6–1.1)
Glucose: 92 mg/dl (ref 70–99)

## 2012-01-16 MED ORDER — TAMOXIFEN CITRATE 20 MG PO TABS: 20.0000 mg | ORAL_TABLET | Freq: Every day | ORAL | Status: DC

## 2012-01-16 NOTE — Progress Notes (Signed)
OFFICE PROGRESS NOTE  CC  Carolyn [name],[name], MD [address], [address] [place] [address] Dr. Cyndia Bent Dr. 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 She had an ultrasound that revealed an area ofconcern measuring 2.9 cm. She also was found to have a right axillary lymph node that was suspicious for malignancy. Patient went on to have biopsy of the calcifications that showed high-grade ductal carcinoma in situ. Biopsy of the right axillary lymph node revealed an invasive ductal carcinoma. The tumor was ER positive PR positive HER-2/neu negative with a Ki-67 of 14%.    #2 on MRI patient was also found to have other areas of concern in the contralateral breast. Also was noted that in the right breast the tumor abutted the chest wall.since then patient has gone on to have biopsies performed at the other areas that are negative for a malignancy. The pathology  is shown below.  #3 patient was begun on neoadjuvant chemotherapy consisting of Taxotere and Cytoxan on 06/27/2011 - 09/04/11  #4 patient is now status post mastectomy but did show residual disease measuring 1.1 cm with 3 lymph nodes positive for metastatic disease.  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 She completed this 12/26/2011.  #7 she will now begin  adjuvant tamoxifen 20 mg daily starting 01/16/2012. Prescription was sent to her pharmacy a total of 10 years of therapy is planned.   CURRENT THERAPY: Tamoxifen 20 mg daily  INTERVAL HISTORY: Carolyn [name] 42 y.o. female returns for followup visit. Overall patient is doing well she is without any significant complaints. She denies any nausea vomiting fevers chills night sweats headaches shortness of breath chest pains palpitations no myalgias and arthralgias. She has no peripheral paresthesias. She feels very well. Remainder of the 10 point review of systems is negative.  MEDICAL HISTORY: Past Medical History  Diagnosis Date  . Breast cancer     invasive ductal carcinoma metastatic ca in 3 of 14 lymph nodes    ALLERGIES:  is allergic to allegra and shellfish allergy.  MEDICATIONS:  Current Outpatient Prescriptions  Medication Sig Dispense Refill  . Cholecalciferol (VITAMIN D3) 2000 UNITS TABS Take 2,000 Units by mouth daily.      Marland Kitchen emollient (BIAFINE) cream Apply topically as needed.      . Loratadine 10 MG CAPS Take 10 mg by mouth daily.      . Multiple Vitamin (MULTIVITAMIN) tablet Take 1 tablet by mouth daily.      . non-metallic deodorant Thornton Papas) MISC Apply 1 application topically daily as needed.      . Wound Cleansers ([name]) Apply topically.      Marland Kitchen acetaminophen (TYLENOL) 500 MG tablet Take 1,000 mg by mouth every 6 (six) hours as needed.      . lidocaine-prilocaine (EMLA) cream Apply topically as needed. Apply to port area 45 mins prior to treatment.  30 g  5  . lidocaine-prilocaine (EMLA) cream Apply topically as needed.  30 g  6  . oxyCODONE-acetaminophen (PERCOCET/ROXICET) 5-325 MG per tablet Take 1-2 tablets by mouth every 4 (four) hours as needed.  30 tablet  0  . tamoxifen (NOLVADEX) 20 MG tablet Take 1 tablet (20 mg total) by mouth daily.  90 tablet  12    SURGICAL HISTORY:  Past Surgical History  Procedure Date  . Wisdom tooth extraction   . Breast biopsy      Left  . Portacath placement 06/20/2011    Procedure: INSERTION PORT-A-CATH;  Surgeon: [name], MD;  Location: [facility] OR;  Service: General;  Laterality: N/A;  . Modified mastectomy 10/03/2011    Procedure: MODIFIED MASTECTOMY;  Surgeon: [name], MD;  Location: [facility];  Service: General;  Laterality: Right;    REVIEW OF SYSTEMS:  Pertinent items are noted in HPI.   PHYSICAL EXAMINATION: General appearance: alert, cooperative and appears stated age Lymph nodes: Cervical, supraclavicular, and axillary nodes normal. Resp: clear to auscultation bilaterally and normal percussion bilaterally Back: symmetric, no curvature. ROM normal. No CVA tenderness. Cardio: regular rate and rhythm, S1, S2 normal, no murmur, click, rub or gallop GI: soft, non-tender; bowel sounds normal; no masses,  no organomegaly Extremities: extremities normal, atraumatic, no cyanosis or edema Neurologic: Grossly normal  ECOG PERFORMANCE STATUS: 0 - Asymptomatic  Blood pressure 112/74, pulse 84, temperature 97.9 F (36.6 C), resp. rate 20, height 5\' 8"  (1.727 m), weight 137 lb 14.4 oz (62.551 kg).  LABORATORY DATA: Lab Results  Component Value Date   WBC 2.5* 01/16/2012   HGB 13.2 01/16/2012   HCT 38.2 01/16/2012   MCV 88.8 01/16/2012   PLT 137* 01/16/2012      Chemistry      Component Value Date/Time   NA 144 01/16/2012 0804   NA 141 09/29/2011 1430   K 4.0 01/16/2012 0804   K 3.8 09/29/2011 1430   CL 102 01/16/2012 0804   CL 101 09/29/2011 1430   CO2 31* 01/16/2012 0804   CO2 30 09/29/2011 1430   BUN 8.0 01/16/2012 0804   BUN 12 09/29/2011 1430   CREATININE 0.9 01/16/2012 0804   CREATININE 0.80 09/29/2011 1430      Component Value Date/Time   CALCIUM 10.4 01/16/2012 0804   CALCIUM 10.4 09/29/2011 1430   ALKPHOS 151* 01/16/2012 0804   ALKPHOS 101 09/29/2011 1430   AST 19 01/16/2012 0804   AST 25 09/29/2011 1430   ALT 18 01/16/2012 0804   ALT 21 09/29/2011 1430   BILITOT 0.42 01/16/2012 0804    BILITOT 0.3 09/29/2011 1430     FINAL DIAGNOSIS Diagnosis 1. Breast, modified radical mastectomy , Right - INVASIVE DUCTAL CARCINOMA, GRADE I / III, SPANNING 1.1 CM. - DUCTAL CARCINOMA IN SITU WITH CALCIFICATIONS, LOW GRADE. - FIBROADENOMA. - METASTATIC CARCINOMA IN 3 OF 14 LYMPH NODES (3/14). - THE SURGICAL RESECTION MARGINS ARE NEGATIVE FOR CARCINOMA. - SEE ONCOLOGY TABLE BELOW. 2. Lymph nodes, regional resection, Right axilla - THERE IS NO EVIDENCE OF CARCINOMA IN 4 OF 4 LYMPH NODES (0/4). Microscopic Comment 1. BREAST, INVASIVE TUMOR, WITH LYMPH NODE SAMPLING Specimen, including laterality: Right breast. Procedure: Modified radical mastectomy. Grade: I. Tubule formation: 2. Nuclear pleomorphism: 1. Mitotic: 1. Tumor size (glass slide measurement): 1.1 cm. Margins: Negative for carcinoma. Invasive, distance to closest margin: 2.0 cm to the deep margin (gross measurement). In-situ, distance to closest margin: 2.0 cm to the deep margin (gross measurement).  Lymphovascular invasion: Not identified. Ductal carcinoma in situ: Present. Grade: Low grade. Extensive intraductal component: Yes. Lobular neoplasia: Not identified. Tumor focality: Unifocal. Treatment effect: None to minimal. Extent of tumor: Confined to breast parenchyma. Lymph nodes: # examined: 18. 1 of 3 FINAL for Capriotti, Ariani C ([id number]) Microscopic Comment(continued) Lymph nodes with metastasis: 3. Isolated tumor cells (< 0.2 mm): 0. Micrometastasis: (> 0.2 mm and < 2.0 mm): 0. Macrometastasis: (> 2.0 mm): 3. Extracapsular extension: Not identified. Breast prognostic profile: Will be repeated on the current case and results reported separately. TNM: ypT1c, ypN1c. 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 Patient presented with  calcifications in the right breast with right axillary lymph node that was positive for invasive ductal carcinoma. The area of concern in the right buttocks the underlying muscle and therefore patient is being offered neoadjuvant chemotherapy. Patient and I have discussed treatment with Taxotere and Cytoxan. Plan is to get give her about 6 cycles of treatment.  #2 patient is now status post mastectomy with the final pathology revealing residual 1.1 cm invasive ductal carcinoma with DCIS grade 13 of 18 lymph nodes positive for metastatic disease tumor was ER positive PR positive HER-2/neu negative. Patient and I discussed her final pathology.  #3 patient is now status post radiation therapy as of 12/26/2011. She will begin tamoxifen 20 mg daily risks and benefits of tamoxifen were discussed with the patient. She understands that the therapy is for 10 years.  #4 patient is considering prophylactic mastectomy of the left breast and bilateral salpingo-oophorectomies. However she wants to wait until spring.  PLAN:  #1 She has now completed radiation therapy as of 02/26/2011.  #2 patient and I discussed the results of her genetic testing. She has also met with Maylon Cos our genetic counselor regarding her positive results for  RAD 51C mutation associated with increased risk for breast and ovarian cancer. 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 We discussed prophylactic left mastectomy and bilateral salpingo-oophorectomies. She is very much interested in this but would like to have this next year some time. She wants to get over her active treatments at this point.  #3 Patient will proceed with tamoxifen 20 mg daily.  #4 she is thinking about prophylactic surgery and I have recommended that she be seen by her gynecologist for bilateral salpingo-oophorectomy. She also should see Dr. Jamey Ripa for left mastectomy.  #5 I will plan on seeing her back in 3  months time.  All questions were answered. The patient knows to call the clinic with any problems, questions or concerns. We can certainly see the patient much sooner if necessary.  I spent 30 minutes counseling the patient face to face. The total time spent in the appointment was 30 minutes.    Drue Second, MD Medical/Oncology [facility] [place] [phone] (beeper) [phone] (Office)

## 2012-01-16 NOTE — Telephone Encounter (Signed)
Message copied by Liliana Cline on Tue [name] 16, 2012 12:54 PM ------      Message from: [name]      Created: Tue [name] 16, 2012 12:36 PM      Regarding: FW: port removal       If she wants to do this soon, let me know and I will put orders in. Need to know if local is OK with her. Don't have to see her pre-op      ----- Message -----         From: Victorino December, MD         Sent: 01/16/2012   9:45 AM           To: [name], MD      Subject: port removal                                             Hi            Amarya can have her port removed            Thank you            KK

## 2012-01-16 NOTE — Telephone Encounter (Signed)
Left message on machine for patient to call back and ask for me. To see if she would like PAC removal with local or mac anesthesia. Awaiting call back.

## 2012-01-16 NOTE — Patient Instructions (Addendum)
Proceed with tamoxifen 20 mg daily  Tamoxifen oral tablet What is this medicine? TAMOXIFEN (ta MOX i fen) blocks the effects of estrogen. It is commonly used to treat breast cancer. It is also used to decrease the chance of breast cancer coming back in women who have received treatment for the disease. It may also help prevent breast cancer in women who have a high risk of developing breast cancer. This medicine may be used for other purposes; ask your health care provider or pharmacist if you have questions. What should I tell my health care provider before I take this medicine? They need to know if you have any of these conditions: -blood clots -blood disease -cataracts or impaired eyesight -endometriosis -high calcium levels -high cholesterol -irregular menstrual cycles -liver disease -stroke -uterine fibroids -an unusual or allergic reaction to tamoxifen, other medicines, foods, dyes, or preservatives -pregnant or trying to get pregnant -breast-feeding How should I use this medicine? Take this medicine by mouth with a glass of water. Follow the directions on the prescription label. You can take it with or without food. Take your medicine at regular intervals. Do not take your medicine more often than directed. Do not stop taking except on your doctor's advice. A special MedGuide will be given to you by the pharmacist with each prescription and refill. Be sure to read this information carefully each time. Talk to your pediatrician regarding the use of this medicine in children. While this drug may be prescribed for selected conditions, precautions do apply. Overdosage: If you think you have taken too much of this medicine contact a poison control center or emergency room at once. NOTE: This medicine is only for you. Do not share this medicine with others. What if I miss a dose? If you miss a dose, take it as soon as you can. If it is almost time for your next dose, take only that dose.  Do not take double or extra doses. What may interact with this medicine? -aminoglutethimide -bromocriptine -chemotherapy drugs -female hormones, like estrogens and birth control pills -letrozole -medroxyprogesterone -phenobarbital -rifampin -warfarin This list may not describe all possible interactions. Give your health care provider a list of all the medicines, herbs, non-prescription drugs, or dietary supplements you use. Also tell them if you smoke, drink alcohol, or use illegal drugs. Some items may interact with your medicine. What should I watch for while using this medicine? Visit your doctor or health care professional for regular checks on your progress. You will need regular pelvic exams, breast exams, and mammograms. If you are taking this medicine to reduce your risk of getting breast cancer, you should know that this medicine does not prevent all types of breast cancer. If breast cancer or other problems occur, there is no guarantee that it will be found at an early stage. Do not become pregnant while taking this medicine or for 2 months after stopping this medicine. Stop taking this medicine if you get pregnant or think you are pregnant and contact your doctor. This medicine may harm your unborn baby. Women who can possibly become pregnant should use birth control methods that do not use hormones during tamoxifen treatment and for 2 months after therapy has stopped. Talk with your health care provider for birth control advice. Do not breast feed while taking this medicine. What side effects may I notice from receiving this medicine? Side effects that you should report to your doctor or health care professional as soon as possible: -changes in vision (  blurred vision) -changes in your menstrual cycle -difficulty breathing or shortness of breath -difficulty walking or talking -new breast lumps -numbness -pelvic pain or pressure -redness, blistering, peeling or loosening of the  skin, including inside the mouth -skin rash or itching (hives) -sudden chest pain -swelling of lips, face, or tongue -swelling, pain or tenderness in your calf or leg -unusual bruising or bleeding -vaginal discharge that is bloody, brown, or rust -weakness -yellowing of the whites of the eyes or skin Side effects that usually do not require medical attention (report to your doctor or health care professional if they continue or are bothersome): -fatigue -hair loss, although uncommon and is usually mild -headache -hot flashes -impotence (in men) -nausea, vomiting (mild) -vaginal discharge (white or clear) This list may not describe all possible side effects. Call your doctor for medical advice about side effects. You may report side effects to FDA at 1-800-FDA-1088. Where should I keep my medicine? Keep out of the reach of children. Store at room temperature between 20 and 25 degrees C (68 and 77 degrees F). Protect from light. Keep container tightly closed. Throw away any unused medicine after the expiration date. NOTE: This sheet is a summary. It may not cover all possible information. If you have questions about this medicine, talk to your doctor, pharmacist, or health care provider.  2012, Elsevier/Gold Standard. (09/12/2007 12:01:56 PM) 

## 2012-01-16 NOTE — Telephone Encounter (Signed)
gv pt appt schedule for April - 3months per 1/7 pof. S/w Gabby @ Dr. Cherly Hensen office re appt for pt. Per [facility] fax records to [phone] attn: Stoney Bang and they will call back w/appt. Gabby asked to call pt w/appt. Pt aware. Message to Tiffany via EPIC to send notes and demographics to Dr. Cherly Hensen.

## 2012-01-17 ENCOUNTER — Other Ambulatory Visit (INDEPENDENT_AMBULATORY_CARE_PROVIDER_SITE_OTHER): Payer: Self-pay | Admitting: Surgery

## 2012-01-17 NOTE — Telephone Encounter (Signed)
Spoke to patient and she is okay with local anesthesia for her PAC removal. Please send orders to scheduling.

## 2012-01-30 ENCOUNTER — Encounter: Payer: Self-pay | Admitting: Radiation Oncology

## 2012-01-30 ENCOUNTER — Encounter (HOSPITAL_BASED_OUTPATIENT_CLINIC_OR_DEPARTMENT_OTHER)
Admission: RE | Disposition: A | Discharge status: Home / Self Care | Payer: Self-pay | Source: Ambulatory Visit | Attending: Surgery

## 2012-01-30 ENCOUNTER — Ambulatory Visit (HOSPITAL_BASED_OUTPATIENT_CLINIC_OR_DEPARTMENT_OTHER)
Admission: RE | Admit: 2012-01-30 | Discharge: 2012-01-30 | Disposition: A | Discharge status: Home / Self Care | Payer: BC Managed Care – PPO | Source: Ambulatory Visit | Attending: Surgery | Admitting: Surgery

## 2012-01-30 DIAGNOSIS — Z452 Encounter for adjustment and management of vascular access device: Secondary | ICD-10-CM

## 2012-01-30 DIAGNOSIS — C50919 Malignant neoplasm of unspecified site of unspecified female breast: Secondary | ICD-10-CM | POA: Insufficient documentation

## 2012-01-30 DIAGNOSIS — C50419 Malignant neoplasm of upper-outer quadrant of unspecified female breast: Secondary | ICD-10-CM

## 2012-01-30 DIAGNOSIS — Z923 Personal history of irradiation: Secondary | ICD-10-CM | POA: Insufficient documentation

## 2012-01-30 DIAGNOSIS — Z9221 Personal history of antineoplastic chemotherapy: Secondary | ICD-10-CM | POA: Insufficient documentation

## 2012-01-30 HISTORY — PX: PORT-A-CATH REMOVAL: SHX5289

## 2012-01-30 SURGERY — MINOR REMOVAL PORT-A-CATH
Anesthesia: LOCAL | Site: Chest | Laterality: Left | Wound class: Clean

## 2012-01-30 MED ORDER — SODIUM BICARBONATE 4 % IV SOLN
INTRAVENOUS | Status: DC | PRN
  Administered 2012-01-30: 10:00:00

## 2012-01-30 SURGICAL SUPPLY — 29 items
BENZOIN TINCTURE PRP APPL 2/3 (GAUZE/BANDAGES/DRESSINGS) IMPLANT
BLADE SURG 15 STRL LF DISP TIS (BLADE) ×1 IMPLANT
BLADE SURG 15 STRL SS (BLADE) ×1
CHLORAPREP W/TINT 26ML (MISCELLANEOUS) ×2 IMPLANT
CLOTH BEACON ORANGE TIMEOUT ST (SAFETY) ×2 IMPLANT
DERMABOND ADVANCED (GAUZE/BANDAGES/DRESSINGS) ×1
DERMABOND ADVANCED .7 DNX12 (GAUZE/BANDAGES/DRESSINGS) ×1 IMPLANT
DRSG TEGADERM 4X4.75 (GAUZE/BANDAGES/DRESSINGS) IMPLANT
ELECT REM PT RETURN 9FT ADLT (ELECTROSURGICAL)
ELECTRODE REM PT RTRN 9FT ADLT (ELECTROSURGICAL) IMPLANT
GAUZE SPONGE 4X4 12PLY STRL LF (GAUZE/BANDAGES/DRESSINGS) IMPLANT
GAUZE SPONGE 4X4 16PLY XRAY LF (GAUZE/BANDAGES/DRESSINGS) IMPLANT
GLOVE BIO SURGEON STRL SZ7.5 (GLOVE) ×2 IMPLANT
GLOVE BIOGEL PI IND STRL 7.0 (GLOVE) ×1 IMPLANT
GLOVE BIOGEL PI INDICATOR 7.0 (GLOVE) ×1
GLOVE EUDERMIC 7 POWDERFREE (GLOVE) ×2 IMPLANT
MARKER SKIN DUAL TIP RULER LAB (MISCELLANEOUS) ×2 IMPLANT
NDL SAFETY ECLIPSE 18X1.5 (NEEDLE) ×1 IMPLANT
NEEDLE HYPO 18GX1.5 SHARP (NEEDLE) ×1
NEEDLE HYPO 25X1 1.5 SAFETY (NEEDLE) ×2 IMPLANT
PENCIL BUTTON HOLSTER BLD 10FT (ELECTRODE) IMPLANT
STRIP CLOSURE SKIN 1/2X4 (GAUZE/BANDAGES/DRESSINGS) IMPLANT
SUT MNCRL AB 4-0 PS2 18 (SUTURE) ×2 IMPLANT
SUT VIC AB 3-0 FS2 27 (SUTURE) IMPLANT
SUT VIC AB 4-0 BRD 54 (SUTURE) IMPLANT
SUT VIC AB 4-0 P-3 18XBRD (SUTURE) IMPLANT
SUT VIC AB 4-0 P3 18 (SUTURE)
SUT VIC AB 4-0 SH 18 (SUTURE) ×2 IMPLANT
SYR CONTROL 10ML LL (SYRINGE) ×2 IMPLANT

## 2012-01-30 NOTE — H&P (Signed)
  Chief complaint: Un-needed port  History of present illness:this patient presents to the minor surgery today for removal of a Port-A-Cath is no longer required for chemotherapy. She's done well through her therapy.  Exam: Port-A-Cath site is noted in the left anterior chest wall.there is no evidence of any other problems.

## 2012-01-30 NOTE — Op Note (Signed)
[facility] Hennen [birth year] [id number] 01/18/2012  Preoperative diagnosis: Un-Needed PAC  Postoperative diagnosis: Same  Procedure: Portacath Removal  Surgeon: [name], MD, FACS  Anesthesia:local   Clinical History and Indications: The patient has finished her chemotherapy and no longer needs a port. She wishes to have it removed.  Procedure: The patient was seen in the preoperative area and we confirmed the plans for the procedure as noted above. The Port-A-Cath site was identified and marked. The patient had no further questions.  The patient was then taken into the procedure room. The timeout was done. The area over the Port-A-Cath was anesthetized with 1% Xylocaine with epinephrine. I waited about 10 minutes and then the area was prepped and draped.  The old scar was opened. The capsule around the port opened and the port identified. The holding sutures were cut. The catheter was backed partially out of its tract. A figure 8 3-0 Vicryl suture was placed, the tubing removed, and the suture tied down to prevent backbleeding.  The port was then removed from its pocket. I made sure everything was dry. The incision was closed with 3-0 Vicryl, 4-0 Monocryl subcuticular, and Dermabond.  The patient tolerated the procedure well. There were no complications.  [name], MD, FACS 01/30/2012 10:36 AM

## 2012-01-31 ENCOUNTER — Encounter: Payer: Self-pay | Admitting: Radiation Oncology

## 2012-01-31 ENCOUNTER — Telehealth (INDEPENDENT_AMBULATORY_CARE_PROVIDER_SITE_OTHER): Payer: Self-pay | Admitting: General Surgery

## 2012-01-31 DIAGNOSIS — Z9221 Personal history of antineoplastic chemotherapy: Secondary | ICD-10-CM | POA: Insufficient documentation

## 2012-01-31 NOTE — Telephone Encounter (Signed)
Did not leave message for this patient .No name stated on voicemail. Will try back later to check on patient and f/u post op appt.

## 2012-02-01 ENCOUNTER — Ambulatory Visit
Admission: RE | Admit: 2012-02-01 | Discharge: 2012-02-01 | Disposition: A | Discharge status: Home / Self Care | Payer: BC Managed Care – PPO | Source: Ambulatory Visit | Attending: Radiation Oncology | Admitting: Radiation Oncology

## 2012-02-01 ENCOUNTER — Encounter: Payer: Self-pay | Admitting: Radiation Oncology

## 2012-02-01 VITALS — BP 105/68 | HR 91 | Temp 97.5°F | Resp 20 | Wt 136.2 lb

## 2012-02-01 DIAGNOSIS — C50419 Malignant neoplasm of upper-outer quadrant of unspecified female breast: Secondary | ICD-10-CM

## 2012-02-01 NOTE — Progress Notes (Signed)
   Department of Radiation Oncology  Phone:  [phone] Fax:        [phone]   Name: Danell Vazquez   DOB: September 26, 1970  MRN: [id number]    Date: 02/01/2012  Follow Up Visit Note  Diagnosis: Right breast cancer  Interval since last radiation: 1 month  Interval History: Editha presents today for routine followup.  She is feeling well and tolerating her tamoxifen well. She is awaiting her hysterectomy and April. She has a followup appointment with Dr. Welton Flakes in April as well. She had her Port-A-Cath removed on Tuesday.  Allergies:  Allergies  Allergen Reactions  . Allegra (Fexofenadine) Hives    Abdomen only  . Shellfish Allergy Hives    Abdomen only    Medications:  Current Outpatient Prescriptions  Medication Sig Dispense Refill  . acetaminophen (TYLENOL) 500 MG tablet Take 1,000 mg by mouth every 6 (six) hours as needed.      . Cholecalciferol (VITAMIN D3) 2000 UNITS TABS Take 2,000 Units by mouth daily.      . Loratadine 10 MG CAPS Take 10 mg by mouth daily.      . Multiple Vitamin (MULTIVITAMIN) tablet Take 1 tablet by mouth daily.      . tamoxifen (NOLVADEX) 20 MG tablet Take 1 tablet (20 mg total) by mouth daily.  90 tablet  12    Physical Exam:   weight is 136 lb 3.2 oz (61.78 kg). Her oral temperature is 97.5 F (36.4 C). Her blood pressure is 105/68 and her pulse is 91. Her respiration is 20.  She has still some skin discoloration on her right chest wall.  IMPRESSION: Giavonni is a 42 y.o. female who is recovering from the acute effects of radiation.  PLAN:  Jadda looks great. I cautioned her about some protection and reconstruction. I have not scheduled followup with me. I be happy to see her back on a when necessary basis.    Lurline Hare, MD

## 2012-02-01 NOTE — Progress Notes (Signed)
Pt denies pain, fatigue, loss of appetite. She is waiting to hear from Ob-Gyn for appt to discuss BSO. She is on Tamoxifen 20 mg daily.

## 2012-02-02 ENCOUNTER — Telehealth: Payer: Self-pay | Admitting: Oncology

## 2012-02-02 NOTE — Telephone Encounter (Signed)
Pt stopped by 1/23 to ck on appt for dr cousins.  Advised pt that rec. had been faxed but then called to ck on the appts and no rec. had been recd.  Called Gabby this morning and re-faxed records to [facility] whom handles the new pts.  L/m for pt today with with info      anne

## 2012-03-29 ENCOUNTER — Ambulatory Visit (INDEPENDENT_AMBULATORY_CARE_PROVIDER_SITE_OTHER): Payer: BC Managed Care – PPO | Admitting: Surgery

## 2012-03-29 ENCOUNTER — Encounter (INDEPENDENT_AMBULATORY_CARE_PROVIDER_SITE_OTHER): Payer: Self-pay | Admitting: Surgery

## 2012-03-29 VITALS — BP 104/70 | HR 72 | Resp 14 | Ht 68.5 in | Wt 135.0 lb

## 2012-03-29 DIAGNOSIS — Z853 Personal history of malignant neoplasm of breast: Secondary | ICD-10-CM

## 2012-03-29 NOTE — Addendum Note (Signed)
Addended byLiliana Cline on: 03/29/2012 10:16 AM   Modules accepted: Orders

## 2012-03-29 NOTE — Patient Instructions (Signed)
We will arrange a consultation for physical therapy to improve the range of motion of your right shoulder. I will need to see you again in 6 months for breast cancer followup.

## 2012-03-29 NOTE — Progress Notes (Signed)
NAME: Carolyn [name]       DOB: 1970/12/24           DATE: 03/29/2012       MRN: [id number]   Carolyn [name] is a 42 y.o.Marland Kitchenfemale who presents for routine followup of her Right breast cancer,IDC, Stage II,  Receptor +, Her2 -, diagnosed in May, 2013  and treated with neoadjuvant chemo, MRM, and radiation. She has no problems or concerns on either side. I took her port out a few months ago and she has done well from that.  PFSH: She has had no significant changes since the last visit here.  ROS: There have been no significant changes since the last visit here  EXAM:  VS: BP 104/70  Pulse 72  Resp 14  Ht 5' 8.5" (1.74 m)  Wt 135 lb (61.236 kg)  BMI 20.23 kg/m2  General: The patient is alert, oriented, generally healthy appearing, NAD. Mood and affect are normal.  Breasts:  the right breast is surgically absent with acute radiation changes still present. There is no evidence of recurrence. The left breast is unremarkable.  Lymphatics: She has no axillary or supraclavicular adenopathy on either side.  Extremities:Has developed limited ROM on t he right since radiation, but no lymphedema noted.  .  Impression: Doing well, with no evidence of recurrent cancer or new cancer Limited ROM right shoulder  Plan: RTC 6 months PT consult put in for ROM

## 2012-04-15 ENCOUNTER — Telehealth: Payer: Self-pay | Admitting: Oncology

## 2012-04-15 ENCOUNTER — Ambulatory Visit: Payer: BC Managed Care – PPO | Admitting: Oncology

## 2012-04-15 ENCOUNTER — Other Ambulatory Visit: Payer: BC Managed Care – PPO | Admitting: Lab

## 2012-04-24 ENCOUNTER — Ambulatory Visit: Payer: BC Managed Care – PPO | Attending: Surgery | Admitting: Physical Therapy

## 2012-04-24 DIAGNOSIS — M25519 Pain in unspecified shoulder: Secondary | ICD-10-CM | POA: Insufficient documentation

## 2012-04-24 DIAGNOSIS — M24519 Contracture, unspecified shoulder: Secondary | ICD-10-CM | POA: Insufficient documentation

## 2012-04-24 DIAGNOSIS — IMO0001 Reserved for inherently not codable concepts without codable children: Secondary | ICD-10-CM | POA: Insufficient documentation

## 2012-04-24 DIAGNOSIS — Z9010 Acquired absence of unspecified breast and nipple: Secondary | ICD-10-CM | POA: Insufficient documentation

## 2012-04-24 DIAGNOSIS — C50919 Malignant neoplasm of unspecified site of unspecified female breast: Secondary | ICD-10-CM | POA: Insufficient documentation

## 2012-05-01 ENCOUNTER — Ambulatory Visit: Payer: BC Managed Care – PPO | Admitting: Physical Therapy

## 2012-05-03 ENCOUNTER — Ambulatory Visit: Payer: BC Managed Care – PPO | Admitting: Physical Therapy

## 2012-05-06 ENCOUNTER — Other Ambulatory Visit: Payer: Self-pay

## 2012-05-06 ENCOUNTER — Other Ambulatory Visit: Payer: Self-pay | Admitting: Oncology

## 2012-05-06 DIAGNOSIS — Z1231 Encounter for screening mammogram for malignant neoplasm of breast: Secondary | ICD-10-CM

## 2012-05-06 DIAGNOSIS — Z9011 Acquired absence of right breast and nipple: Secondary | ICD-10-CM

## 2012-05-07 ENCOUNTER — Encounter: Payer: Self-pay | Admitting: Oncology

## 2012-05-07 NOTE — Progress Notes (Signed)
Mailed clinical information to [facility]. [employer] attn: Alto Denver, RN.

## 2012-05-08 ENCOUNTER — Ambulatory Visit: Payer: BC Managed Care – PPO

## 2012-05-10 ENCOUNTER — Ambulatory Visit: Payer: BC Managed Care – PPO | Attending: Surgery

## 2012-05-10 DIAGNOSIS — Z9010 Acquired absence of unspecified breast and nipple: Secondary | ICD-10-CM | POA: Insufficient documentation

## 2012-05-10 DIAGNOSIS — C50919 Malignant neoplasm of unspecified site of unspecified female breast: Secondary | ICD-10-CM | POA: Insufficient documentation

## 2012-05-10 DIAGNOSIS — M25519 Pain in unspecified shoulder: Secondary | ICD-10-CM | POA: Insufficient documentation

## 2012-05-10 DIAGNOSIS — IMO0001 Reserved for inherently not codable concepts without codable children: Secondary | ICD-10-CM | POA: Insufficient documentation

## 2012-05-10 DIAGNOSIS — M24519 Contracture, unspecified shoulder: Secondary | ICD-10-CM | POA: Insufficient documentation

## 2012-05-15 ENCOUNTER — Ambulatory Visit: Payer: BC Managed Care – PPO | Admitting: Physical Therapy

## 2012-05-17 ENCOUNTER — Ambulatory Visit: Payer: BC Managed Care – PPO | Admitting: Physical Therapy

## 2012-05-22 ENCOUNTER — Ambulatory Visit: Payer: BC Managed Care – PPO

## 2012-05-24 ENCOUNTER — Ambulatory Visit: Payer: BC Managed Care – PPO

## 2012-05-29 ENCOUNTER — Ambulatory Visit: Payer: BC Managed Care – PPO

## 2012-05-31 ENCOUNTER — Ambulatory Visit: Payer: BC Managed Care – PPO | Admitting: Physical Therapy

## 2012-06-05 ENCOUNTER — Ambulatory Visit: Payer: BC Managed Care – PPO

## 2012-06-07 ENCOUNTER — Ambulatory Visit: Payer: BC Managed Care – PPO

## 2012-06-10 ENCOUNTER — Ambulatory Visit
Admission: RE | Admit: 2012-06-10 | Discharge: 2012-06-10 | Disposition: A | Discharge status: Home / Self Care | Payer: BC Managed Care – PPO | Source: Ambulatory Visit

## 2012-06-10 DIAGNOSIS — Z9011 Acquired absence of right breast and nipple: Secondary | ICD-10-CM

## 2012-06-10 DIAGNOSIS — Z1231 Encounter for screening mammogram for malignant neoplasm of breast: Secondary | ICD-10-CM

## 2012-06-12 ENCOUNTER — Ambulatory Visit: Payer: BC Managed Care – PPO | Attending: Surgery

## 2012-06-12 DIAGNOSIS — M24519 Contracture, unspecified shoulder: Secondary | ICD-10-CM | POA: Insufficient documentation

## 2012-06-12 DIAGNOSIS — C50919 Malignant neoplasm of unspecified site of unspecified female breast: Secondary | ICD-10-CM | POA: Insufficient documentation

## 2012-06-12 DIAGNOSIS — Z9010 Acquired absence of unspecified breast and nipple: Secondary | ICD-10-CM | POA: Insufficient documentation

## 2012-06-12 DIAGNOSIS — M25519 Pain in unspecified shoulder: Secondary | ICD-10-CM | POA: Insufficient documentation

## 2012-06-12 DIAGNOSIS — IMO0001 Reserved for inherently not codable concepts without codable children: Secondary | ICD-10-CM | POA: Insufficient documentation

## 2012-06-14 ENCOUNTER — Ambulatory Visit: Payer: BC Managed Care – PPO

## 2012-06-19 ENCOUNTER — Ambulatory Visit: Payer: BC Managed Care – PPO

## 2012-06-21 ENCOUNTER — Ambulatory Visit: Payer: BC Managed Care – PPO

## 2012-07-03 ENCOUNTER — Encounter: Payer: BC Managed Care – PPO | Admitting: Physical Therapy

## 2012-07-04 ENCOUNTER — Other Ambulatory Visit (HOSPITAL_BASED_OUTPATIENT_CLINIC_OR_DEPARTMENT_OTHER): Payer: BC Managed Care – PPO | Admitting: Lab

## 2012-07-04 ENCOUNTER — Ambulatory Visit (HOSPITAL_BASED_OUTPATIENT_CLINIC_OR_DEPARTMENT_OTHER): Payer: BC Managed Care – PPO | Admitting: Oncology

## 2012-07-04 ENCOUNTER — Telehealth: Payer: Self-pay | Admitting: Oncology

## 2012-07-04 ENCOUNTER — Encounter: Payer: Self-pay | Admitting: Oncology

## 2012-07-04 VITALS — BP 110/75 | HR 60 | Temp 97.3°F | Resp 20 | Ht 68.5 in | Wt 135.5 lb

## 2012-07-04 DIAGNOSIS — C50411 Malignant neoplasm of upper-outer quadrant of right female breast: Secondary | ICD-10-CM

## 2012-07-04 DIAGNOSIS — C50419 Malignant neoplasm of upper-outer quadrant of unspecified female breast: Secondary | ICD-10-CM

## 2012-07-04 LAB — COMPREHENSIVE METABOLIC PANEL (CC13)
ALT: 13 U/L (ref 0–55)
AST: 17 U/L (ref 5–34)
Albumin: 3.6 g/dL (ref 3.5–5.0)
BUN: 11.1 mg/dL (ref 7.0–26.0)
CO2: 29 mEq/L (ref 22–29)
Calcium: 9.3 mg/dL (ref 8.4–10.4)
Chloride: 106 mEq/L (ref 98–109)
Creatinine: 0.9 mg/dL (ref 0.6–1.1)
Potassium: 3.9 mEq/L (ref 3.5–5.1)

## 2012-07-04 LAB — CBC WITH DIFFERENTIAL/PLATELET
BASO%: 0 % (ref 0.0–2.0)
Basophils Absolute: 0 10*3/uL (ref 0.0–0.1)
EOS%: 1.1 % (ref 0.0–7.0)
HCT: 36.1 % (ref 34.8–46.6)
HGB: 12.5 g/dL (ref 11.6–15.9)
MONO#: 0.3 10*3/uL (ref 0.1–0.9)
NEUT%: 51.3 % (ref 38.4–76.8)
RDW: 12 % (ref 11.2–14.5)
WBC: 3.7 10*3/uL — ABNORMAL LOW (ref 3.9–10.3)
lymph#: 1.5 10*3/uL (ref 0.9–3.3)

## 2012-07-04 NOTE — Telephone Encounter (Signed)
, °

## 2012-07-04 NOTE — Progress Notes (Signed)
OFFICE PROGRESS NOTE  CC  Carolyn [name],Carolyn L, MD [address] [place] [address] Dr. Cyndia Bent Dr. 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 She had an ultrasound that revealed an area ofconcern measuring 2.9 cm. She also was found to have a right axillary lymph node that was suspicious for malignancy. Patient went on to have biopsy of the calcifications that showed high-grade ductal carcinoma in situ. Biopsy of the right axillary lymph node revealed an invasive ductal carcinoma. The tumor was ER positive PR positive HER-2/neu negative with a Ki-67 of 14%.    #2 on MRI patient was also found to have other areas of concern in the contralateral breast. Also was noted that in the right breast the tumor abutted the chest wall.since then patient has gone on to have biopsies performed at the other areas that are negative for a malignancy. The pathology  is shown below.  #3 patient was begun on neoadjuvant chemotherapy consisting of Taxotere and Cytoxan on 06/27/2011 - 09/04/11  #4 patient is now status post mastectomy but did show residual disease measuring 1.1 cm with 3 lymph nodes positive for metastatic disease.  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 She completed this 12/26/2011.  #7 she will now begin adjuvant  tamoxifen 20 mg daily starting 01/16/2012. Prescription was sent to her pharmacy a total of 10 years of therapy is planned.   CURRENT THERAPY: Tamoxifen 20 mg daily  INTERVAL HISTORY: Carolyn [name] 42 y.o. female returns for followup visit. Overall patient is doing well she is without any significant complaints. She denies any nausea vomiting fevers chills night sweats headaches shortness of breath chest pains palpitations no myalgias and arthralgias. She has no peripheral paresthesias. She feels very well. Remainder of the 10 point review of systems is negative.  MEDICAL HISTORY: Past Medical History  Diagnosis Date  . 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 Hx of radiation therapy 11/08/11 -12/26/11    right chest wall/supraclav fossa, right scar  . History of chemotherapy 06/27/11 -09/04/11    neoadjuvant    ALLERGIES:  is allergic to allegra and shellfish allergy.  MEDICATIONS:  Current Outpatient Prescriptions  Medication Sig Dispense Refill  . acetaminophen (TYLENOL) 500 MG tablet Take 1,000 mg by mouth every 6 (six) hours as needed.      . Cholecalciferol (VITAMIN D3) 2000 UNITS TABS Take 2,000 Units by mouth daily.      Marland Kitchen gabapentin (NEURONTIN) 100 MG capsule       . Loratadine 10 MG CAPS Take 10 mg by mouth daily.      . Multiple Vitamin (MULTIVITAMIN) tablet Take 1 tablet by mouth daily.      . tamoxifen (NOLVADEX) 20 MG tablet Take 1 tablet (20 mg total) by mouth daily.  90 tablet  12  . valACYclovir (VALTREX) 1000 MG  tablet        No current facility-administered medications for this visit.    SURGICAL HISTORY:  Past Surgical History  Procedure Laterality Date  . Wisdom tooth extraction    . Breast biopsy      Left  . Portacath placement  06/20/2011    Procedure: INSERTION PORT-A-CATH;  Surgeon: [name], MD;  Location: [facility] OR;  Service: General;  Laterality: N/A;  . Modified mastectomy  10/03/2011    Procedure:  MODIFIED MASTECTOMY;  Surgeon: [name], MD;  Location: [facility];  Service: General;  Laterality: Right;  . Port-a-cath removal  01/30/2012    Procedure: MINOR REMOVAL PORT-A-CATH;  Surgeon: [name], MD;  Location: [facility];  Service: General;  Laterality: Left;    REVIEW OF SYSTEMS:  Pertinent items are noted in HPI.   PHYSICAL EXAMINATION: General appearance: alert, cooperative and appears stated age Lymph nodes: Cervical, supraclavicular, and axillary nodes normal. Resp: clear to auscultation bilaterally and normal percussion bilaterally Back: symmetric, no curvature. ROM normal. No CVA tenderness. Cardio: regular rate and rhythm, S1, S2 normal, no murmur, click, rub or gallop GI: soft, non-tender; bowel sounds normal; no masses,  no organomegaly Extremities: extremities normal, atraumatic, no cyanosis or edema Neurologic: Grossly normal  ECOG PERFORMANCE STATUS: 0 - Asymptomatic  Blood pressure 110/75, pulse 60, temperature 97.3 F (36.3 C), temperature source Oral, resp. rate 20, height 5' 8.5" (1.74 m), weight 135 lb 8 oz (61.462 kg).  LABORATORY DATA: Lab Results  Component Value Date   WBC 3.7* 07/04/2012   HGB 12.5 07/04/2012   HCT 36.1 07/04/2012   MCV 88.7 07/04/2012   PLT 131* 07/04/2012      Chemistry      Component Value Date/Time   NA 142 07/04/2012 1346   NA 141 09/29/2011 1430   K 3.9 07/04/2012 1346   K 3.8 09/29/2011 1430   CL 102 01/16/2012 0804   CL 101 09/29/2011 1430   CO2 29 07/04/2012 1346   CO2 30 09/29/2011 1430   BUN 11.1 07/04/2012 1346   BUN 12 09/29/2011 1430   CREATININE 0.9 07/04/2012 1346   CREATININE 0.80 09/29/2011 1430      Component Value Date/Time   CALCIUM 9.3 07/04/2012 1346   CALCIUM 10.4 09/29/2011 1430   ALKPHOS 105 07/04/2012 1346   ALKPHOS 101 09/29/2011 1430   AST 17 07/04/2012 1346   AST 25 09/29/2011 1430   ALT 13 07/04/2012 1346   ALT 21 09/29/2011 1430   BILITOT 0.26 07/04/2012 1346    BILITOT 0.3 09/29/2011 1430     FINAL DIAGNOSIS Diagnosis 1. Breast, modified radical mastectomy , Right - INVASIVE DUCTAL CARCINOMA, GRADE I / III, SPANNING 1.1 CM. - DUCTAL CARCINOMA IN SITU WITH CALCIFICATIONS, LOW GRADE. - FIBROADENOMA. - METASTATIC CARCINOMA IN 3 OF 14 LYMPH NODES (3/14). - THE SURGICAL RESECTION MARGINS ARE NEGATIVE FOR CARCINOMA. - SEE ONCOLOGY TABLE BELOW. 2. Lymph nodes, regional resection, Right axilla - THERE IS NO EVIDENCE OF CARCINOMA IN 4 OF 4 LYMPH NODES (0/4). Microscopic Comment 1. BREAST, INVASIVE TUMOR, WITH LYMPH NODE SAMPLING Specimen, including laterality: Right breast. Procedure: Modified radical mastectomy. Grade: I. Tubule formation: 2. Nuclear pleomorphism: 1. Mitotic: 1. Tumor size (glass slide measurement): 1.1 cm. Margins: Negative for carcinoma. Invasive, distance to closest margin: 2.0 cm to the deep margin (gross measurement). In-situ, distance to closest margin: 2.0 cm to the deep margin (gross measurement). Lymphovascular invasion: Not identified. Ductal carcinoma  in situ: Present. Grade: Low grade. Extensive intraductal component: Yes. Lobular neoplasia: Not identified. Tumor focality: Unifocal. Treatment effect: None to minimal. Extent of tumor: Confined to breast parenchyma. Lymph nodes: # examined: 18. 1 of 3 FINAL for Fournier, Nasira C ([id number]) Microscopic Comment(continued) Lymph nodes with metastasis: 3. Isolated tumor cells (< 0.2 mm): 0. Micrometastasis: (> 0.2 mm and < 2.0 mm): 0. Macrometastasis: (> 2.0 mm): 3. Extracapsular extension: Not identified. Breast prognostic profile: Will be repeated on the current case and results reported separately. TNM: ypT1c, ypN1c. 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 Patient presented  with calcifications in the right breast with right axillary lymph node that was positive for invasive ductal carcinoma. The area of concern in the right buttocks the underlying muscle and therefore patient is being offered neoadjuvant chemotherapy. Patient and I have discussed treatment with Taxotere and Cytoxan. Plan is to get give her about 6 cycles of treatment.  #2 patient is now status post mastectomy with the final pathology revealing residual 1.1 cm invasive ductal carcinoma with DCIS grade 13 of 18 lymph nodes positive for metastatic disease tumor was ER positive PR positive HER-2/neu negative. Patient and I discussed her final pathology.  #3 patient is now status post radiation therapy as of 12/26/2011. She will begin tamoxifen 20 mg daily risks and benefits of tamoxifen were discussed with the patient. She understands that the therapy is for 10 years.  #4 patient is considering prophylactic mastectomy of the left breast and bilateral salpingo-oophorectomies. However she wants to wait until spring.patient and I discussed the results of her genetic testing.   #5  She has also met with Maylon Cos our genetic counselor regarding her positive results for  RAD 51C mutation associated with increased risk for breast and ovarian cancer. 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 We discussed prophylactic left mastectomy and bilateral salpingo-oophorectomies. She is very much interested in this but would like to have this next year some time. She wants to get over her active treatments at this point.  #1 She has now completed radiation therapy as of 02/26/2011.  PLAN:  #1Continue tamoxifen 20 mg daily  #2 I will see you back in 6 months  #3 Proceed gynecologic surgery. Hold tamoxifen 3 days prior to surgery then resume 7 days after surgery  All questions were answered. The patient knows to call the clinic with any problems, questions or concerns.  We can certainly see the patient much sooner if necessary.  I spent 30 minutes counseling the patient face to face. The total time spent in the appointment was 30 minutes.    Drue Second, MD Medical/Oncology [facility] [phone] (beeper) [phone] (Office)

## 2012-07-04 NOTE — Patient Instructions (Addendum)
Continue tamoxifen 20 mg daily  I will see you back in 6 months  Proceed gynecologic surgery. Hold tamoxifen 3 days prior to surgery then resume 7 days after surgery

## 2012-09-10 ENCOUNTER — Encounter (INDEPENDENT_AMBULATORY_CARE_PROVIDER_SITE_OTHER): Payer: Self-pay | Admitting: Surgery

## 2012-09-10 ENCOUNTER — Ambulatory Visit (INDEPENDENT_AMBULATORY_CARE_PROVIDER_SITE_OTHER): Payer: BC Managed Care – PPO | Admitting: Surgery

## 2012-09-10 VITALS — BP 120/70 | HR 74 | Temp 98.0°F | Resp 18 | Ht 68.5 in | Wt 135.0 lb

## 2012-09-10 DIAGNOSIS — Z853 Personal history of malignant neoplasm of breast: Secondary | ICD-10-CM

## 2012-09-10 NOTE — Progress Notes (Signed)
NAME: Carolyn [name]       DOB: 10-Sep-1970           DATE: 09/10/2012       MRN: [id number]   Sherral Dirocco is a 42 y.o.Marland Kitchenfemale who presents for routine followup of her Right breast cancer,IDC, Stage II,  Receptor +, Her2 -, diagnosed in May, 2013  and treated with neoadjuvant chemo, MRM, and radiation. She has no problems or concerns on either side.   PFSH: She has had no significant changes since the last visit here.  ROS: There have been no significant changes since the last visit here  EXAM:  VS: BP 120/70  Pulse 74  Temp(Src) 98 F (36.7 C)  Resp 18  Ht 5' 8.5" (1.74 m)  Wt 135 lb (61.236 kg)  BMI 20.23 kg/m2  General: The patient is alert, oriented, generally healthy appearing, NAD. Mood and affect are normal.  Breasts:  the right breast is surgically absent with chronic radiation changes present. There is no evidence of recurrence. The left breast is unremarkable.  Lymphatics: She has no axillary or supraclavicular adenopathy on either side.  Extremities:Has developed limited ROM on t he right since radiation,improved since last visit,  but no lymphedema noted.  Data reviewed: Mammogram June, 2014: IMPRESSION:  No evidence of malignancy. Screening mammography is recommended in  one year.  RECOMMENDATION:  Screening mammogram in one year. (Code:[id number]-B-01Y)  BI-RADS CATEGORY 1: Negative.  Original Report Authenticated By: Beckie Salts, M.D.   .  Impression: Doing well, with no evidence of recurrent cancer or new cancer Limited ROM right shoulder  Plan: RTC 12 months

## 2012-09-10 NOTE — Patient Instructions (Signed)
Continue annual mammograms and followups here.

## 2012-12-02 ENCOUNTER — Encounter (HOSPITAL_COMMUNITY): Payer: Self-pay | Admitting: Pharmacist

## 2012-12-11 ENCOUNTER — Other Ambulatory Visit: Payer: Self-pay | Admitting: Obstetrics and Gynecology

## 2012-12-11 ENCOUNTER — Inpatient Hospital Stay (HOSPITAL_COMMUNITY)
Admission: RE | Admit: 2012-12-11 | Discharge: 2012-12-11 | Disposition: A | Discharge status: Home / Self Care | Payer: BC Managed Care – PPO | Source: Ambulatory Visit

## 2012-12-11 NOTE — Patient Instructions (Signed)
   Your procedure is scheduled on: Monday, Dec 15  Enter through the Main Entrance of [facility] at: 6 AM Pick up the phone at the desk and dial [phone] and inform us of your arrival.  Please call this number if you have any problems the morning of surgery: [phone]  Remember: Do not eat or drink after midnight: Sunday Take these medicines the morning of surgery with a SIP OF WATER:  Do not wear jewelry, make-up, or FINGER nail polish No metal in your hair or on your body. Do not wear lotions, powders, perfumes. You may wear deodorant.  Please use your CHG wash as directed prior to surgery.  Do not shave anywhere for at least 12 hours prior to first CHG shower.  Do not bring valuables to the hospital. Contacts, dentures or bridgework may not be worn into surgery.  Leave suitcase in the car. After Surgery it may be brought to your room. For patients being admitted to the hospital, checkout time is 11:00am the day of discharge.  Patients discharged on the day of surgery will not be allowed to drive home.

## 2012-12-12 ENCOUNTER — Encounter (HOSPITAL_COMMUNITY)
Admission: RE | Admit: 2012-12-12 | Discharge: 2012-12-12 | Disposition: A | Discharge status: Home / Self Care | Payer: BC Managed Care – PPO | Source: Ambulatory Visit | Attending: Obstetrics and Gynecology | Admitting: Obstetrics and Gynecology

## 2012-12-12 ENCOUNTER — Encounter (HOSPITAL_COMMUNITY): Payer: Self-pay

## 2012-12-12 DIAGNOSIS — Z01812 Encounter for preprocedural laboratory examination: Secondary | ICD-10-CM | POA: Insufficient documentation

## 2012-12-12 DIAGNOSIS — Z01818 Encounter for other preprocedural examination: Secondary | ICD-10-CM | POA: Insufficient documentation

## 2012-12-12 LAB — BASIC METABOLIC PANEL
Chloride: 101 mEq/L (ref 96–112)
GFR calc Af Amer: >90 mL/min (ref >90)
GFR calc non Af Amer: >90 mL/min (ref >90)
Potassium: 3.5 mEq/L (ref 3.5–5.1)
Sodium: 140 mEq/L (ref 135–145)

## 2012-12-12 LAB — CBC
Hemoglobin: 13 g/dL (ref 12.0–15.0)
MCHC: 34.9 g/dL (ref 30.0–36.0)
Platelets: 142 10*3/uL — ABNORMAL LOW (ref 150–400)
RBC: 4.25 MIL/uL (ref 3.87–5.11)
WBC: 4 10*3/uL (ref 4.0–10.5)

## 2012-12-12 NOTE — Patient Instructions (Addendum)
   Your procedure is scheduled on:   Monday, Dec 15  Enter through the Main Entrance of [facility] at: 6 AM Pick up the phone at the desk and dial [phone] and inform us of your arrival.  Please call this number if you have any problems the morning of surgery: [phone]  Remember: Do not eat or drink after midnight: Sunday Take these medicines the morning of surgery with a SIP OF WATER:  gabapentin  Do not wear jewelry, make-up, or FINGER nail polish No metal in your hair or on your body. Do not wear lotions, powders, perfumes. You may wear deodorant.  Please use your CHG wash as directed prior to surgery.  Do not shave anywhere for at least 12 hours prior to first CHG shower.  Do not bring valuables to the hospital. Contacts, dentures or bridgework may not be worn into surgery.  Leave suitcase in the car. After Surgery it may be brought to your room. For patients being admitted to the hospital, checkout time is 11:00am the day of discharge.  Home with nephew Sylvan Cheese cell   [phone].   -

## 2012-12-16 ENCOUNTER — Telehealth: Payer: Self-pay | Admitting: *Deleted

## 2012-12-16 ENCOUNTER — Other Ambulatory Visit (HOSPITAL_BASED_OUTPATIENT_CLINIC_OR_DEPARTMENT_OTHER): Payer: BC Managed Care – PPO | Admitting: Lab

## 2012-12-16 ENCOUNTER — Ambulatory Visit (HOSPITAL_BASED_OUTPATIENT_CLINIC_OR_DEPARTMENT_OTHER): Payer: BC Managed Care – PPO | Admitting: Oncology

## 2012-12-16 VITALS — BP 117/76 | HR 99 | Temp 98.0°F | Resp 19 | Ht 68.0 in | Wt 133.4 lb

## 2012-12-16 DIAGNOSIS — C773 Secondary and unspecified malignant neoplasm of axilla and upper limb lymph nodes: Secondary | ICD-10-CM

## 2012-12-16 DIAGNOSIS — C50419 Malignant neoplasm of upper-outer quadrant of unspecified female breast: Secondary | ICD-10-CM

## 2012-12-16 DIAGNOSIS — Z170 Estrogen receptor positive status [ER+]: Secondary | ICD-10-CM

## 2012-12-16 DIAGNOSIS — C50911 Malignant neoplasm of unspecified site of right female breast: Secondary | ICD-10-CM

## 2012-12-16 LAB — CBC WITH DIFFERENTIAL/PLATELET
Basophils Absolute: 0 10*3/uL (ref 0.0–0.1)
Eosinophils Absolute: 0 10*3/uL (ref 0.0–0.5)
HCT: 38.2 % (ref 34.8–46.6)
HGB: 13 g/dL (ref 11.6–15.9)
LYMPH%: 46.1 % (ref 14.0–49.7)
MONO#: 0.3 10*3/uL (ref 0.1–0.9)
NEUT#: 1.7 10*3/uL (ref 1.5–6.5)
Platelets: 139 10*3/uL — ABNORMAL LOW (ref 145–400)
RBC: 4.22 10*6/uL (ref 3.70–5.45)
RDW: 11.9 % (ref 11.2–14.5)
WBC: 3.7 10*3/uL — ABNORMAL LOW (ref 3.9–10.3)
lymph#: 1.7 10*3/uL (ref 0.9–3.3)

## 2012-12-16 LAB — COMPREHENSIVE METABOLIC PANEL (CC13)
Albumin: 4 g/dL (ref 3.5–5.0)
Anion Gap: 9 mEq/L (ref 3–11)
CO2: 31 mEq/L — ABNORMAL HIGH (ref 22–29)
Glucose: 90 mg/dl (ref 70–140)
Potassium: 3.6 mEq/L (ref 3.5–5.1)
Sodium: 144 mEq/L (ref 136–145)
Total Bilirubin: 0.34 mg/dL (ref 0.20–1.20)
Total Protein: 7.5 g/dL (ref 6.4–8.3)

## 2012-12-16 NOTE — Telephone Encounter (Signed)
appts made and printed...td 

## 2012-12-16 NOTE — Progress Notes (Signed)
OFFICE PROGRESS NOTE  CC  ANDY,CAMILLE L, MD [address] [place] [address] Dr. Cyndia Bent Dr. 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 She had an ultrasound that revealed an area ofconcern measuring 2.9 cm. She also was found to have a right axillary lymph node that was suspicious for malignancy. Patient went on to have biopsy of the calcifications that showed high-grade ductal carcinoma in situ. Biopsy of the right axillary lymph node revealed an invasive ductal carcinoma. The tumor was ER positive PR positive HER-2/neu negative with a Ki-67 of 14%.    #2 on MRI patient was also found to have other areas of concern in the contralateral breast. Also was noted that in the right breast the tumor abutted the chest wall.since then patient has gone on to have biopsies performed at the other areas that are negative for a malignancy. The pathology  is shown below.  #3 patient was begun on neoadjuvant chemotherapy consisting of Taxotere and Cytoxan on 06/27/2011 - 09/04/11  #4 patient is now status post mastectomy but did show residual disease measuring 1.1 cm with 3 lymph nodes positive for metastatic disease.  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 She completed this 12/26/2011.  #7 currently receiving   adjuvant tamoxifen 20 mg daily starting 01/16/2012. a total of 10 years of therapy is planned.   CURRENT THERAPY: Tamoxifen 20 mg daily  INTERVAL HISTORY: Carolyn [name] 42 y.o. female returns for followup visit. Overall patient is doing well she is without any significant complaints. She denies any nausea vomiting fevers chills night sweats headaches shortness of breath chest pains palpitations no myalgias and arthralgias. She has no peripheral paresthesias. She feels very well. She is going to have for any hysterectomy performed next Monday, December 15. Remainder of the 10 point review of systems is negative.  MEDICAL HISTORY: Past Medical History  Diagnosis Date  . 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 Hx of radiation therapy 11/08/11 -12/26/11    right chest wall/supraclav fossa, right scar  . History of chemotherapy 06/27/11 -09/04/11    neoadjuvant    ALLERGIES:  is allergic to allegra and shellfish allergy.  MEDICATIONS:  Current Outpatient Prescriptions  Medication Sig Dispense Refill  . acetaminophen (TYLENOL) 500 MG tablet Take 1,000 mg by mouth every 6 (six) hours as needed.      . Cholecalciferol (VITAMIN D3) 2000 UNITS TABS Take 2,000 Units by mouth daily.      Marland Kitchen gabapentin (NEURONTIN) 100 MG capsule       . Loratadine 10 MG CAPS Take 10 mg by mouth daily.      . Multiple Vitamin (MULTIVITAMIN) tablet Take 1 tablet by mouth daily.      . tamoxifen (NOLVADEX) 20 MG tablet Take 1 tablet (20 mg total) by mouth daily.  90 tablet  12   No current facility-administered medications for this visit.    SURGICAL HISTORY:  Past Surgical History  Procedure Laterality Date  . Wisdom tooth extraction    . Portacath placement  06/20/2011    Procedure: INSERTION PORT-A-CATH;  Surgeon: [name], MD;  Location: [facility] OR;  Service: General;  Laterality: N/A;  . Modified mastectomy  10/03/2011    Procedure: MODIFIED MASTECTOMY;   Surgeon: [name], MD;  Location: [facility];  Service: General;  Laterality: Right;  . Port-a-cath removal  01/30/2012    Procedure: MINOR REMOVAL PORT-A-CATH;  Surgeon: [name], MD;  Location: [facility];  Service: General;  Laterality: Left;  . Breast biopsy      Left    REVIEW OF SYSTEMS:  Pertinent items are noted in HPI.   PHYSICAL EXAMINATION: General appearance: alert, cooperative and appears stated age Lymph nodes: Cervical, supraclavicular, and axillary nodes normal. Resp: clear to auscultation bilaterally and normal percussion bilaterally Back: symmetric, no curvature. ROM normal. No CVA tenderness. Cardio: regular rate and rhythm, S1, S2 normal, no murmur, click, rub or gallop GI: soft, non-tender; bowel sounds normal; no masses,  no organomegaly Extremities: extremities normal, atraumatic, no cyanosis or edema Neurologic: Grossly normal  ECOG PERFORMANCE STATUS: 0 - Asymptomatic  Blood pressure 117/76, pulse 99, temperature 98 F (36.7 C), temperature source Oral, resp. rate 19, height 5\' 8"  (1.727 m), weight 133 lb 6.4 oz (60.51 kg).  LABORATORY DATA: Lab Results  Component Value Date   WBC 3.7* 12/16/2012   HGB 13.0 12/16/2012   HCT 38.2 12/16/2012   MCV 90.5 12/16/2012   PLT 139* 12/16/2012      Chemistry      Component Value Date/Time   NA 140 12/12/2012 1527   NA 142 07/04/2012 1346   K 3.5 12/12/2012 1527   K 3.9 07/04/2012 1346   CL 101 12/12/2012 1527   CL 102 01/16/2012 0804   CO2 33* 12/12/2012 1527   CO2 29 07/04/2012 1346   BUN 9 12/12/2012 1527   BUN 11.1 07/04/2012 1346   CREATININE 0.77 12/12/2012 1527   CREATININE 0.9 07/04/2012 1346      Component Value Date/Time   CALCIUM 9.6 12/12/2012 1527   CALCIUM 9.3 07/04/2012 1346   ALKPHOS 105 07/04/2012 1346   ALKPHOS 101 09/29/2011 1430   AST 17 07/04/2012 1346   AST 25 09/29/2011 1430   ALT 13 07/04/2012 1346   ALT 21 09/29/2011 1430   BILITOT 0.26 07/04/2012  1346   BILITOT 0.3 09/29/2011 1430     FINAL DIAGNOSIS Diagnosis 1. Breast, modified radical mastectomy , Right - INVASIVE DUCTAL CARCINOMA, GRADE I / III, SPANNING 1.1 CM. - DUCTAL CARCINOMA IN SITU WITH CALCIFICATIONS, LOW GRADE. - FIBROADENOMA. - METASTATIC CARCINOMA IN 3 OF 14 LYMPH NODES (3/14). - THE SURGICAL RESECTION MARGINS ARE NEGATIVE FOR CARCINOMA. - SEE ONCOLOGY TABLE BELOW. 2. Lymph nodes, regional resection, Right axilla - THERE IS NO EVIDENCE OF CARCINOMA IN 4 OF 4 LYMPH NODES (0/4). Microscopic Comment 1. BREAST, INVASIVE TUMOR, WITH LYMPH NODE SAMPLING Specimen, including laterality: Right breast. Procedure: Modified radical mastectomy. Grade: I. Tubule formation: 2. Nuclear pleomorphism: 1. Mitotic: 1. Tumor size (glass slide measurement): 1.1 cm. Margins: Negative for carcinoma. Invasive, distance to closest margin: 2.0 cm to the deep margin (gross measurement). In-situ, distance to closest margin: 2.0 cm to the deep margin (gross measurement). Lymphovascular invasion: Not identified. Ductal carcinoma in situ: Present. Grade: Low  grade. Extensive intraductal component: Yes. Lobular neoplasia: Not identified. Tumor focality: Unifocal. Treatment effect: None to minimal. Extent of tumor: Confined to breast parenchyma. Lymph nodes: # examined: 18. 1 of 3 FINAL for Gilman, Briggett C ([id number]) Microscopic Comment(continued) Lymph nodes with metastasis: 3. Isolated tumor cells (< 0.2 mm): 0. Micrometastasis: (> 0.2 mm and < 2.0 mm): 0. Macrometastasis: (> 2.0 mm): 3. Extracapsular extension: Not identified. Breast prognostic profile: Will be repeated on the current case and results reported separately. TNM: ypT1c, ypN1c. 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 Patient  presented with calcifications in the right breast with right axillary lymph node that was positive for invasive ductal carcinoma. The area of concern in the right buttocks the underlying muscle and therefore patient is being offered neoadjuvant chemotherapy. Patient and I have discussed treatment with Taxotere and Cytoxan. Plan is to get give her about 6 cycles of treatment.  #2 patient is now status post mastectomy with the final pathology revealing residual 1.1 cm invasive ductal carcinoma with DCIS grade 13 of 18 lymph nodes positive for metastatic disease tumor was ER positive PR positive HER-2/neu negative. Patient and I discussed her final pathology.  #3 patient is now status post radiation therapy as of 12/26/2011. She will begin tamoxifen 20 mg daily risks and benefits of tamoxifen were discussed with the patient. She understands that the therapy is for 10 years.  #4 patient is considering prophylactic mastectomy of the left breast and bilateral salpingo-oophorectomies. However she wants to wait until spring.patient and I discussed the results of her genetic testing.   #5  She has also met with Maylon Cos our genetic counselor regarding her positive results for  RAD 51C mutation associated with increased risk for breast and ovarian cancer. 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 We discussed prophylactic left mastectomy and bilateral salpingo-oophorectomies. She is very much interested in this but would like to have this next year some time. She wants to get over her active treatments at this point.  #1 She has now completed radiation therapy as of 02/26/2011.  PLAN:  #1Continue tamoxifen 20 mg daily  #2 I will see you back in 6 months  #3 Proceed gynecologic surgery. Hold tamoxifen 3 days prior to surgery then resume 7 days after surgery  All questions were answered. The patient knows to call the clinic with any problems, questions or  concerns. We can certainly see the patient much sooner if necessary.  I spent 30 minutes counseling the patient face to face. The total time spent in the appointment was 30 minutes.    Drue Second, MD Medical/Oncology [facility] [phone] (beeper) [phone] (Office)

## 2012-12-17 ENCOUNTER — Encounter (HOSPITAL_COMMUNITY): Payer: Self-pay | Admitting: Pharmacy Technician

## 2012-12-23 ENCOUNTER — Encounter (HOSPITAL_COMMUNITY)
Admission: RE | Disposition: A | Discharge status: Home / Self Care | Payer: Self-pay | Source: Ambulatory Visit | Attending: Obstetrics and Gynecology

## 2012-12-23 ENCOUNTER — Ambulatory Visit (HOSPITAL_COMMUNITY): Payer: BC Managed Care – PPO | Admitting: Anesthesiology

## 2012-12-23 ENCOUNTER — Encounter (HOSPITAL_COMMUNITY): Payer: Self-pay

## 2012-12-23 ENCOUNTER — Encounter (HOSPITAL_COMMUNITY): Payer: BC Managed Care – PPO | Admitting: Anesthesiology

## 2012-12-23 ENCOUNTER — Ambulatory Visit (HOSPITAL_COMMUNITY)
Admission: RE | Admit: 2012-12-23 | Discharge: 2012-12-24 | Disposition: A | Discharge status: Home / Self Care | Payer: BC Managed Care – PPO | Source: Ambulatory Visit | Attending: Obstetrics and Gynecology | Admitting: Obstetrics and Gynecology

## 2012-12-23 DIAGNOSIS — Z853 Personal history of malignant neoplasm of breast: Secondary | ICD-10-CM | POA: Insufficient documentation

## 2012-12-23 DIAGNOSIS — N838 Other noninflammatory disorders of ovary, fallopian tube and broad ligament: Secondary | ICD-10-CM | POA: Insufficient documentation

## 2012-12-23 DIAGNOSIS — D250 Submucous leiomyoma of uterus: Secondary | ICD-10-CM | POA: Insufficient documentation

## 2012-12-23 DIAGNOSIS — D251 Intramural leiomyoma of uterus: Secondary | ICD-10-CM | POA: Insufficient documentation

## 2012-12-23 DIAGNOSIS — Z90710 Acquired absence of both cervix and uterus: Secondary | ICD-10-CM

## 2012-12-23 DIAGNOSIS — N9489 Other specified conditions associated with female genital organs and menstrual cycle: Secondary | ICD-10-CM | POA: Insufficient documentation

## 2012-12-23 DIAGNOSIS — Z4002 Encounter for prophylactic removal of ovary: Secondary | ICD-10-CM | POA: Insufficient documentation

## 2012-12-23 DIAGNOSIS — N8300 Follicular cyst of ovary, unspecified side: Secondary | ICD-10-CM | POA: Insufficient documentation

## 2012-12-23 DIAGNOSIS — N840 Polyp of corpus uteri: Secondary | ICD-10-CM | POA: Insufficient documentation

## 2012-12-23 HISTORY — PX: ROBOTIC ASSISTED TOTAL HYSTERECTOMY WITH BILATERAL SALPINGO OOPHERECTOMY: SHX6086

## 2012-12-23 SURGERY — ROBOTIC ASSISTED TOTAL HYSTERECTOMY WITH BILATERAL SALPINGO OOPHORECTOMY
Anesthesia: General | Site: Abdomen | Laterality: Bilateral

## 2012-12-23 MED ORDER — EPHEDRINE 5 MG/ML INJ
INTRAVENOUS | Status: AC
  Filled 2012-12-23: qty 10

## 2012-12-23 MED ORDER — FENTANYL CITRATE 0.05 MG/ML IJ SOLN
INTRAMUSCULAR | Status: DC | PRN
  Administered 2012-12-23: 100 ug via INTRAVENOUS
  Administered 2012-12-23 (×3): 50 ug via INTRAVENOUS

## 2012-12-23 MED ORDER — LACTATED RINGERS IR SOLN
Status: DC | PRN
  Administered 2012-12-23: 3000 mL

## 2012-12-23 MED ORDER — IBUPROFEN 800 MG PO TABS
800.0000 mg | ORAL_TABLET | Freq: Three times a day (TID) | ORAL | Status: DC | PRN
  Administered 2012-12-23: 800 mg via ORAL
  Filled 2012-12-23: qty 1

## 2012-12-23 MED ORDER — HYDROMORPHONE HCL PF 1 MG/ML IJ SOLN
0.2000 mg | INTRAMUSCULAR | Status: DC | PRN
  Administered 2012-12-23: 0.5 mg via INTRAVENOUS

## 2012-12-23 MED ORDER — DEXTROSE IN LACTATED RINGERS 5 % IV SOLN
INTRAVENOUS | Status: DC
  Administered 2012-12-23: 19:00:00 via INTRAVENOUS

## 2012-12-23 MED ORDER — PROPOFOL 10 MG/ML IV EMUL
INTRAVENOUS | Status: AC
  Filled 2012-12-23: qty 20

## 2012-12-23 MED ORDER — PHENYLEPHRINE 40 MCG/ML (10ML) SYRINGE FOR IV PUSH (FOR BLOOD PRESSURE SUPPORT)
PREFILLED_SYRINGE | INTRAVENOUS | Status: AC
  Filled 2012-12-23: qty 15

## 2012-12-23 MED ORDER — SCOPOLAMINE 1 MG/3DAYS TD PT72
1.0000 | MEDICATED_PATCH | TRANSDERMAL | Status: DC
  Administered 2012-12-23: 1.5 mg via TRANSDERMAL

## 2012-12-23 MED ORDER — GLYCOPYRROLATE 0.2 MG/ML IJ SOLN
INTRAMUSCULAR | Status: AC
  Filled 2012-12-23: qty 1

## 2012-12-23 MED ORDER — MIDAZOLAM HCL 2 MG/2ML IJ SOLN
INTRAMUSCULAR | Status: AC
  Filled 2012-12-23: qty 2

## 2012-12-23 MED ORDER — ZOLPIDEM TARTRATE 5 MG PO TABS: 5.0000 mg | ORAL_TABLET | Freq: Every evening | ORAL | Status: DC | PRN

## 2012-12-23 MED ORDER — KETOROLAC TROMETHAMINE 30 MG/ML IJ SOLN: 30.0000 mg | Freq: Four times a day (QID) | INTRAMUSCULAR | Status: DC

## 2012-12-23 MED ORDER — KETOROLAC TROMETHAMINE 30 MG/ML IJ SOLN: 15.0000 mg | Freq: Once | INTRAMUSCULAR | Status: DC | PRN

## 2012-12-23 MED ORDER — KETOROLAC TROMETHAMINE 30 MG/ML IJ SOLN
INTRAMUSCULAR | Status: DC | PRN
  Administered 2012-12-23: 30 mg via INTRAVENOUS

## 2012-12-23 MED ORDER — PROPOFOL 10 MG/ML IV BOLUS
INTRAVENOUS | Status: DC | PRN
  Administered 2012-12-23: 200 mg via INTRAVENOUS

## 2012-12-23 MED ORDER — MIDAZOLAM HCL 2 MG/2ML IJ SOLN
INTRAMUSCULAR | Status: DC | PRN
  Administered 2012-12-23: 2 mg via INTRAVENOUS

## 2012-12-23 MED ORDER — LIDOCAINE HCL (CARDIAC) 20 MG/ML IV SOLN
INTRAVENOUS | Status: AC
  Filled 2012-12-23: qty 5

## 2012-12-23 MED ORDER — PANTOPRAZOLE SODIUM 40 MG PO TBEC
40.0000 mg | DELAYED_RELEASE_TABLET | Freq: Every day | ORAL | Status: DC
  Filled 2012-12-23 (×3): qty 1

## 2012-12-23 MED ORDER — OXYCODONE-ACETAMINOPHEN 5-325 MG PO TABS
1.0000 | ORAL_TABLET | ORAL | Status: DC | PRN
  Administered 2012-12-23: 1 via ORAL
  Filled 2012-12-23: qty 1

## 2012-12-23 MED ORDER — LACTATED RINGERS IV SOLN
INTRAVENOUS | Status: DC
  Administered 2012-12-23 (×3): via INTRAVENOUS

## 2012-12-23 MED ORDER — ONDANSETRON HCL 4 MG/2ML IJ SOLN
INTRAMUSCULAR | Status: AC
  Filled 2012-12-23: qty 2

## 2012-12-23 MED ORDER — FENTANYL CITRATE 0.05 MG/ML IJ SOLN: 25.0000 ug | INTRAMUSCULAR | Status: DC | PRN

## 2012-12-23 MED ORDER — ONDANSETRON HCL 4 MG/2ML IJ SOLN
INTRAMUSCULAR | Status: DC | PRN
  Administered 2012-12-23: 4 mg via INTRAVENOUS

## 2012-12-23 MED ORDER — ONDANSETRON HCL 4 MG/2ML IJ SOLN: 4.0000 mg | Freq: Four times a day (QID) | INTRAMUSCULAR | Status: DC | PRN

## 2012-12-23 MED ORDER — NEOSTIGMINE METHYLSULFATE 1 MG/ML IJ SOLN
INTRAMUSCULAR | Status: AC
  Filled 2012-12-23: qty 1

## 2012-12-23 MED ORDER — DEXAMETHASONE SODIUM PHOSPHATE 10 MG/ML IJ SOLN
INTRAMUSCULAR | Status: AC
  Filled 2012-12-23: qty 1

## 2012-12-23 MED ORDER — BUPIVACAINE HCL (PF) 0.25 % IJ SOLN
INTRAMUSCULAR | Status: DC | PRN
  Administered 2012-12-23: 9 mL

## 2012-12-23 MED ORDER — KETOROLAC TROMETHAMINE 30 MG/ML IJ SOLN
INTRAMUSCULAR | Status: AC
  Filled 2012-12-23: qty 2

## 2012-12-23 MED ORDER — HYDROMORPHONE HCL PF 1 MG/ML IJ SOLN
INTRAMUSCULAR | Status: AC
  Filled 2012-12-23: qty 1

## 2012-12-23 MED ORDER — MIDAZOLAM HCL 2 MG/2ML IJ SOLN: 0.5000 mg | Freq: Once | INTRAMUSCULAR | Status: DC | PRN

## 2012-12-23 MED ORDER — ALBUTEROL SULFATE HFA 108 (90 BASE) MCG/ACT IN AERS
INHALATION_SPRAY | RESPIRATORY_TRACT | Status: AC
  Filled 2012-12-23: qty 6.7

## 2012-12-23 MED ORDER — DEXAMETHASONE SODIUM PHOSPHATE 10 MG/ML IJ SOLN
INTRAMUSCULAR | Status: DC | PRN
  Administered 2012-12-23: 10 mg via INTRAVENOUS

## 2012-12-23 MED ORDER — MEPERIDINE HCL 25 MG/ML IJ SOLN: 6.2500 mg | INTRAMUSCULAR | Status: DC | PRN

## 2012-12-23 MED ORDER — ARTIFICIAL TEARS OP OINT
TOPICAL_OINTMENT | OPHTHALMIC | Status: AC
  Filled 2012-12-23: qty 3.5

## 2012-12-23 MED ORDER — GLYCOPYRROLATE 0.2 MG/ML IJ SOLN
INTRAMUSCULAR | Status: AC
  Filled 2012-12-23: qty 2

## 2012-12-23 MED ORDER — FENTANYL CITRATE 0.05 MG/ML IJ SOLN
INTRAMUSCULAR | Status: AC
  Filled 2012-12-23: qty 5

## 2012-12-23 MED ORDER — BUPIVACAINE HCL (PF) 0.25 % IJ SOLN
INTRAMUSCULAR | Status: AC
  Filled 2012-12-23: qty 10

## 2012-12-23 MED ORDER — MENTHOL 3 MG MT LOZG
1.0000 | LOZENGE | OROMUCOSAL | Status: DC | PRN
  Filled 2012-12-23: qty 9

## 2012-12-23 MED ORDER — NEOSTIGMINE METHYLSULFATE 1 MG/ML IJ SOLN
INTRAMUSCULAR | Status: DC | PRN
  Administered 2012-12-23: 4 mg via INTRAVENOUS

## 2012-12-23 MED ORDER — GABAPENTIN 100 MG PO CAPS
100.0000 mg | ORAL_CAPSULE | Freq: Three times a day (TID) | ORAL | Status: DC
  Administered 2012-12-23 – 2012-12-24 (×2): 100 mg via ORAL
  Filled 2012-12-23 (×5): qty 1

## 2012-12-23 MED ORDER — LACTATED RINGERS IV SOLN: INTRAVENOUS | Status: DC

## 2012-12-23 MED ORDER — PHENYLEPHRINE HCL 10 MG/ML IJ SOLN
INTRAMUSCULAR | Status: DC | PRN
  Administered 2012-12-23 (×4): 80 ug via INTRAVENOUS
  Administered 2012-12-23: 40 ug via INTRAVENOUS
  Administered 2012-12-23: 80 ug via INTRAVENOUS

## 2012-12-23 MED ORDER — EPHEDRINE SULFATE 50 MG/ML IJ SOLN
INTRAMUSCULAR | Status: DC | PRN
  Administered 2012-12-23: 10 mg via INTRAVENOUS

## 2012-12-23 MED ORDER — LIDOCAINE HCL (CARDIAC) 20 MG/ML IV SOLN
INTRAVENOUS | Status: DC | PRN
  Administered 2012-12-23: 60 mg via INTRAVENOUS

## 2012-12-23 MED ORDER — ROCURONIUM BROMIDE 100 MG/10ML IV SOLN
INTRAVENOUS | Status: AC
  Filled 2012-12-23: qty 1

## 2012-12-23 MED ORDER — ROCURONIUM BROMIDE 100 MG/10ML IV SOLN
INTRAVENOUS | Status: DC | PRN
  Administered 2012-12-23 (×4): 10 mg via INTRAVENOUS
  Administered 2012-12-23: 40 mg via INTRAVENOUS

## 2012-12-23 MED ORDER — HYDROMORPHONE HCL PF 1 MG/ML IJ SOLN
INTRAMUSCULAR | Status: AC
Start: 2012-12-23 — End: 2012-12-24
  Filled 2012-12-23: qty 1

## 2012-12-23 MED ORDER — GLYCOPYRROLATE 0.2 MG/ML IJ SOLN
INTRAMUSCULAR | Status: DC | PRN
  Administered 2012-12-23: 0.6 mg via INTRAVENOUS

## 2012-12-23 MED ORDER — CEFAZOLIN SODIUM-DEXTROSE 2-3 GM-% IV SOLR
2.0000 g | INTRAVENOUS | Status: AC
  Administered 2012-12-23: 2 g via INTRAVENOUS

## 2012-12-23 MED ORDER — HEPARIN SODIUM (PORCINE) 5000 UNIT/ML IJ SOLN
INTRAMUSCULAR | Status: AC
  Filled 2012-12-23: qty 1

## 2012-12-23 MED ORDER — GABAPENTIN 100 MG PO CAPS
100.0000 mg | ORAL_CAPSULE | Freq: Three times a day (TID) | ORAL | Status: DC
Start: 2012-12-23 — End: 2012-12-23
  Filled 2012-12-23 (×5): qty 1

## 2012-12-23 MED ORDER — ONDANSETRON HCL 4 MG PO TABS: 4.0000 mg | ORAL_TABLET | Freq: Four times a day (QID) | ORAL | Status: DC | PRN

## 2012-12-23 MED ORDER — TAMOXIFEN CITRATE 10 MG PO TABS
20.0000 mg | ORAL_TABLET | Freq: Every day | ORAL | Status: DC
  Filled 2012-12-23 (×3): qty 2

## 2012-12-23 MED ORDER — PROMETHAZINE HCL 25 MG/ML IJ SOLN: 6.2500 mg | INTRAMUSCULAR | Status: DC | PRN

## 2012-12-23 MED ORDER — INFLUENZA VAC SPLIT QUAD 0.5 ML IM SUSP
0.5000 mL | INTRAMUSCULAR | Status: AC
  Administered 2012-12-24: 0.5 mL via INTRAMUSCULAR
  Filled 2012-12-23: qty 0.5

## 2012-12-23 MED ORDER — HYDROMORPHONE HCL PF 1 MG/ML IJ SOLN
INTRAMUSCULAR | Status: DC | PRN
  Administered 2012-12-23: .5 mg via INTRAVENOUS

## 2012-12-23 MED ORDER — SCOPOLAMINE 1 MG/3DAYS TD PT72
MEDICATED_PATCH | TRANSDERMAL | Status: AC
  Filled 2012-12-23: qty 1

## 2012-12-23 SURGICAL SUPPLY — 66 items
BAG URINE DRAINAGE (UROLOGICAL SUPPLIES) ×2 IMPLANT
BARRIER ADHS 3X4 INTERCEED (GAUZE/BANDAGES/DRESSINGS) ×2 IMPLANT
CATH FOLEY 3WAY  5CC 16FR (CATHETERS) ×1
CATH FOLEY 3WAY 5CC 16FR (CATHETERS) ×1 IMPLANT
CHLORAPREP W/TINT 26ML (MISCELLANEOUS) ×2 IMPLANT
CLOTH BEACON ORANGE TIMEOUT ST (SAFETY) ×2 IMPLANT
CONT PATH 16OZ SNAP LID 3702 (MISCELLANEOUS) ×2 IMPLANT
COVER MAYO STAND STRL (DRAPES) ×2 IMPLANT
COVER TABLE BACK 60X90 (DRAPES) ×4 IMPLANT
COVER TIP SHEARS 8 DVNC (MISCELLANEOUS) ×1 IMPLANT
COVER TIP SHEARS 8MM DA VINCI (MISCELLANEOUS) ×1
DECANTER SPIKE VIAL GLASS SM (MISCELLANEOUS) ×2 IMPLANT
DERMABOND ADVANCED (GAUZE/BANDAGES/DRESSINGS) ×1
DERMABOND ADVANCED .7 DNX12 (GAUZE/BANDAGES/DRESSINGS) ×1 IMPLANT
DEVICE TROCAR PUNCTURE CLOSURE (ENDOMECHANICALS) IMPLANT
DRAPE HUG U DISPOSABLE (DRAPE) ×2 IMPLANT
DRAPE LG THREE QUARTER DISP (DRAPES) ×4 IMPLANT
DRAPE WARM FLUID 44X44 (DRAPE) ×2 IMPLANT
ELECT REM PT RETURN 9FT ADLT (ELECTROSURGICAL) ×2
ELECTRODE REM PT RTRN 9FT ADLT (ELECTROSURGICAL) ×1 IMPLANT
EVACUATOR SMOKE 8.L (FILTER) ×2 IMPLANT
GAUZE VASELINE 3X9 (GAUZE/BANDAGES/DRESSINGS) IMPLANT
GLOVE BIOGEL PI IND STRL 7.0 (GLOVE) ×2 IMPLANT
GLOVE BIOGEL PI INDICATOR 7.0 (GLOVE) ×2
GLOVE ECLIPSE 6.5 STRL STRAW (GLOVE) ×2 IMPLANT
GOWN STRL REIN XL XLG (GOWN DISPOSABLE) ×12 IMPLANT
GYRUS RUMI II 2.5CM BLUE (DISPOSABLE) ×2
KIT ACCESSORY DA VINCI DISP (KITS) ×1
KIT ACCESSORY DVNC DISP (KITS) ×1 IMPLANT
LEGGING LITHOTOMY PAIR STRL (DRAPES) ×2 IMPLANT
NEEDLE INSUFFLATION 14GA 150MM (NEEDLE) ×2 IMPLANT
OCCLUDER COLPOPNEUMO (BALLOONS) ×2 IMPLANT
PACK LAVH (CUSTOM PROCEDURE TRAY) ×2 IMPLANT
PAD PREP 24X48 CUFFED NSTRL (MISCELLANEOUS) ×4 IMPLANT
PLUG CATH AND CAP STER (CATHETERS) ×2 IMPLANT
PROTECTOR NERVE ULNAR (MISCELLANEOUS) ×4 IMPLANT
RUMI II GYRUS 2.5CM BLUE (DISPOSABLE) ×1 IMPLANT
SCISSORS LAP 5X35 DISP (ENDOMECHANICALS) IMPLANT
SET CYSTO W/LG BORE CLAMP LF (SET/KITS/TRAYS/PACK) IMPLANT
SET IRRIG TUBING LAPAROSCOPIC (IRRIGATION / IRRIGATOR) ×2 IMPLANT
SOLUTION ELECTROLUBE (MISCELLANEOUS) ×2 IMPLANT
SUT VIC AB 0 CT1 27 (SUTURE) ×5
SUT VIC AB 0 CT1 27XBRD ANBCTR (SUTURE) ×5 IMPLANT
SUT VIC AB 0 CT1 36 (SUTURE) ×4 IMPLANT
SUT VIC AB 0 CT2 27 (SUTURE) ×8 IMPLANT
SUT VICRYL 0 UR6 27IN ABS (SUTURE) ×2 IMPLANT
SUT VLOC 180 0 6IN GS21 (SUTURE) ×4 IMPLANT
SUT VLOC 180 0 9IN  GS21 (SUTURE)
SUT VLOC 180 0 9IN GS21 (SUTURE) IMPLANT
SYR 50ML LL SCALE MARK (SYRINGE) ×2 IMPLANT
SYRINGE 10CC LL (SYRINGE) ×2 IMPLANT
TIP RUMI ORANGE 6.7MMX12CM (TIP) IMPLANT
TIP UTERINE 5.1X6CM LAV DISP (MISCELLANEOUS) ×2 IMPLANT
TIP UTERINE 6.7X10CM GRN DISP (MISCELLANEOUS) IMPLANT
TIP UTERINE 6.7X6CM WHT DISP (MISCELLANEOUS) IMPLANT
TIP UTERINE 6.7X8CM BLUE DISP (MISCELLANEOUS) IMPLANT
TOWEL OR 17X24 6PK STRL BLUE (TOWEL DISPOSABLE) ×6 IMPLANT
TROCAR 12M 150ML BLUNT (TROCAR) IMPLANT
TROCAR DISP BLADELESS 8 DVNC (TROCAR) ×1 IMPLANT
TROCAR DISP BLADELESS 8MM (TROCAR) ×1
TROCAR XCEL 12X100 BLDLESS (ENDOMECHANICALS) ×2 IMPLANT
TROCAR XCEL NON-BLD 5MMX100MML (ENDOMECHANICALS) ×2 IMPLANT
TROCAR Z-THREAD 12X150 (TROCAR) ×2 IMPLANT
TUBING FILTER THERMOFLATOR (ELECTROSURGICAL) ×2 IMPLANT
WARMER LAPAROSCOPE (MISCELLANEOUS) ×2 IMPLANT
WATER STERILE IRR 1000ML POUR (IV SOLUTION) ×6 IMPLANT

## 2012-12-23 NOTE — Anesthesia Preprocedure Evaluation (Signed)
Anesthesia Evaluation  Patient identified by MRN, date of birth, ID band Patient awake    Reviewed: Allergy & Precautions, H&P , Patient's Chart, lab work & pertinent test results, reviewed documented beta blocker date and time   History of Anesthesia Complications Negative for: history of anesthetic complications  Airway Mallampati: II  TM Distance: >3 FB Neck ROM: full    Dental   Pulmonary  breath sounds clear to auscultation        Cardiovascular Exercise Tolerance: Good Rhythm:regular Rate:Normal     Neuro/Psych    GI/Hepatic   Endo/Other    Renal/GU      Musculoskeletal   Abdominal   Peds  Hematology   Anesthesia Other Findings   Reproductive/Obstetrics                             Anesthesia Physical Anesthesia Plan  ASA: II  Anesthesia Plan: General ETT   Post-op Pain Management:    Induction:   Airway Management Planned:   Additional Equipment:   Intra-op Plan:   Post-operative Plan:   Informed Consent: I have reviewed the patients History and Physical, chart, labs and discussed the procedure including the risks, benefits and alternatives for the proposed anesthesia with the patient or authorized representative who has indicated his/her understanding and acceptance.   Dental Advisory Given  Plan Discussed with: CRNA and Surgeon  Anesthesia Plan Comments:         Anesthesia Quick Evaluation  

## 2012-12-23 NOTE — Transfer of Care (Signed)
Immediate Anesthesia Transfer of Care Note  Patient: Carolyn [name]  Procedure(s) Performed: Procedure(s): ROBOTIC ASSISTED TOTAL HYSTERECTOMY WITH BILATERAL SALPINGO OOPHORECTOMY (Bilateral)  Patient Location: PACU  Anesthesia Type:General  Level of Consciousness: awake, alert  and oriented  Airway & Oxygen Therapy: Patient Spontanous Breathing and Patient connected to nasal cannula oxygen  Post-op Assessment: Report given to PACU RN and Post -op Vital signs reviewed and stable  Post vital signs: stable  Complications: No apparent anesthesia complications

## 2012-12-23 NOTE — Anesthesia Postprocedure Evaluation (Signed)
  Anesthesia Post-op Note  Patient: Carolyn [name]  Procedure(s) Performed: Procedure(s): ROBOTIC ASSISTED TOTAL HYSTERECTOMY WITH BILATERAL SALPINGO OOPHORECTOMY (Bilateral)  Patient Location: [facility]  Anesthesia Type:General  Level of Consciousness: awake, alert  and oriented  Airway and Oxygen Therapy: Patient Spontanous Breathing and Patient connected to nasal cannula oxygen  Post-op Pain: none  Post-op Assessment: Post-op Vital signs reviewed, Patient's Cardiovascular Status Stable and Respiratory Function Stable  Post-op Vital Signs: Reviewed and stable  Complications: No apparent anesthesia complications

## 2012-12-23 NOTE — Anesthesia Postprocedure Evaluation (Signed)
  Anesthesia Post-op Note  Patient: Carolyn [name]  Procedure(s) Performed: Procedure(s): ROBOTIC ASSISTED TOTAL HYSTERECTOMY WITH BILATERAL SALPINGO OOPHORECTOMY (Bilateral)  Patient is awake and responsive. Pain and nausea are reasonably well controlled. Vital signs are stable and clinically acceptable. Oxygen saturation is clinically acceptable. There are no apparent anesthetic complications at this time. Patient is ready for discharge.

## 2012-12-23 NOTE — Brief Op Note (Signed)
12/23/2012  12:03 PM  PATIENT:  Carolyn [name]  42 y.o. female  PRE-OPERATIVE DIAGNOSIS:  Personal history of stage II B  Right breast cancer,  RAD51C positive,  POST-OPERATIVE DIAGNOSIS:  personal history of Stage II B Right breast cancer, RAD51C positive, uterine fibroid  PROCEDURE:  Procedure(s): ROBOTIC ASSISTED TOTAL HYSTERECTOMY WITH BILATERAL SALPINGO OOPHORECTOMY (Bilateral), pelvic washing  SURGEON:  Surgeon(s) and Role:    * Yasha Tibbett Cathie Beams, MD - Primary      PHYSICIAN ASSISTANT:   ASSISTANTS: Shea Evans, MD   ANESTHESIA:   general  Findings: fibroid uterus, nl right ovary, left ovary w/ cyst, ureters seen bilaterally deviated deep in pelvis, nl liver edge, left ovary adherent to uterus and post peritoneum  EBL:  Total I/O In: 2000 [I.V.:2000] Out: 330 [Urine:250; Blood:80]  BLOOD ADMINISTERED:none  DRAINS: none   LOCAL MEDICATIONS USED:  MARCAINE     SPECIMEN:  Source of Specimen:  uterus w/ cervix, tubes, ovaries, pelvic washings  DISPOSITION OF SPECIMEN:  PATHOLOGY  COUNTS:  YES  TOURNIQUET:  * No tourniquets in log *  DICTATION: .Other Dictation: Dictation Number [id number]  PLAN OF CARE: Admit for overnight observation  PATIENT DISPOSITION:  PACU - hemodynamically stable.   Delay start of Pharmacological VTE agent (>24hrs) due to surgical blood loss or risk of bleeding: no

## 2012-12-24 ENCOUNTER — Encounter (HOSPITAL_COMMUNITY): Payer: Self-pay | Admitting: Obstetrics and Gynecology

## 2012-12-24 LAB — BASIC METABOLIC PANEL
BUN: 10 mg/dL (ref 6–23)
CO2: 28 mEq/L (ref 19–32)
Calcium: 8.5 mg/dL (ref 8.4–10.5)
Creatinine, Ser: 0.9 mg/dL (ref 0.50–1.10)
GFR calc Af Amer: 89 mL/min — ABNORMAL LOW (ref >90)
GFR calc non Af Amer: 77 mL/min — ABNORMAL LOW (ref >90)
Glucose, Bld: 96 mg/dL (ref 70–99)
Potassium: 3.7 mEq/L (ref 3.5–5.1)
Sodium: 139 mEq/L (ref 135–145)

## 2012-12-24 LAB — CBC
Hemoglobin: 11.2 g/dL — ABNORMAL LOW (ref 12.0–15.0)
MCH: 30.4 pg (ref 26.0–34.0)
MCHC: 34.6 g/dL (ref 30.0–36.0)
MCV: 88 fL (ref 78.0–100.0)
Platelets: 114 10*3/uL — ABNORMAL LOW (ref 150–400)

## 2012-12-24 MED ORDER — OXYCODONE-ACETAMINOPHEN 5-325 MG PO TABS: 1.0000 | ORAL_TABLET | ORAL | Status: DC | PRN

## 2012-12-24 MED ORDER — IBUPROFEN 800 MG PO TABS: 800.0000 mg | ORAL_TABLET | Freq: Three times a day (TID) | ORAL | Status: DC | PRN

## 2012-12-24 MED ORDER — ZOLPIDEM TARTRATE 5 MG PO TABS: 5.0000 mg | ORAL_TABLET | Freq: Every evening | ORAL | Status: DC | PRN

## 2012-12-24 MED FILL — Heparin Sodium (Porcine) Inj 5000 Unit/ML: INTRAMUSCULAR | Qty: 1 | Status: AC

## 2012-12-24 NOTE — Progress Notes (Signed)
Pt.is discharged in the care of  Family member. Denies any  Pain or discomfort. Lapsites are clean and dry. Discharge instructions with Rx were given to pt.Questions were asked and answere Stable.

## 2012-12-24 NOTE — Discharge Summary (Signed)
Physician Discharge Summary  Patient ID: Carolyn [name] MRN: [id number] DOB/AGE: 1970-12-26 42 y.o.  Admit date: 12/23/2012 Discharge date: 12/24/2012  Admission Diagnoses: personal hx stage II B right breast cancer, Rad 51C gene positive  Discharge Diagnoses: personal hx stage IIB right breast cancer, RAD 51 C gene positive, fibroid uterus  Active Problems:   S/P total hysterectomy and bilateral salpingo-oophorectomy   Discharged Condition: stable  Hospital Course: Pt was admitted to [facility]. She underwent davinci robotic total hysterectomy, bilateral salpingo-oophrectomy, washing due to (+) RAD51C  and personal hx of right breast cancer. Postop course was unremarkable  Consults: None  Significant Diagnostic Studies: labs:  CBC    Component Value Date/Time   WBC 8.3 12/24/2012 0520   WBC 3.7* 12/16/2012 1057   RBC 3.68* 12/24/2012 0520   RBC 4.22 12/16/2012 1057   HGB 11.2* 12/24/2012 0520   HGB 13.0 12/16/2012 1057   HCT 32.4* 12/24/2012 0520   HCT 38.2 12/16/2012 1057   PLT 114* 12/24/2012 0520   PLT 139* 12/16/2012 1057   MCV 88.0 12/24/2012 0520   MCV 90.5 12/16/2012 1057   MCH 30.4 12/24/2012 0520   MCH 30.9 12/16/2012 1057   MCHC 34.6 12/24/2012 0520   MCHC 34.1 12/16/2012 1057   RDW 11.8 12/24/2012 0520   RDW 11.9 12/16/2012 1057   LYMPHSABS 1.7 12/16/2012 1057   LYMPHSABS 1.3 09/29/2011 1430   MONOABS 0.3 12/16/2012 1057   MONOABS 0.5 09/29/2011 1430   EOSABS 0.0 12/16/2012 1057   EOSABS 0.1 09/29/2011 1430   BASOSABS 0.0 12/16/2012 1057   BASOSABS 0.0 09/29/2011 1430    BMET    Component Value Date/Time   NA 139 12/24/2012 0520   NA 144 12/16/2012 1058   K 3.7 12/24/2012 0520   K 3.6 12/16/2012 1058   CL 105 12/24/2012 0520   CL 102 01/16/2012 0804   CO2 28 12/24/2012 0520   CO2 31* 12/16/2012 1058   GLUCOSE 96 12/24/2012 0520   GLUCOSE 90 12/16/2012 1058   GLUCOSE 92 01/16/2012 0804   BUN 10 12/24/2012 0520   BUN 9.5 12/16/2012 1058   CREATININE 0.90 12/24/2012 0520   CREATININE 0.8 12/16/2012 1058   CALCIUM 8.5 12/24/2012 0520   CALCIUM 9.9 12/16/2012 1058   GFRNONAA 77* 12/24/2012 0520   GFRAA 89* 12/24/2012 0520     Treatments: surgery: da vinci robotic total hysterectomy, BSO, pelvic washings  Discharge Exam: Blood pressure 92/51, pulse 92, temperature 98.8 F (37.1 C), temperature source Oral, resp. rate 16, height 5\' 8"  (1.727 m), weight 60.328 kg (133 lb), SpO2 97.00%. General appearance: alert, cooperative and no distress Back: no tenderness to percussion or palpation Cardio: regular rate and rhythm, S1, S2 normal, no murmur, click, rub or gallop GI: soft, non-tender; bowel sounds normal; no masses,  no organomegaly Pelvic: deferred Extremities: no edema, redness or tenderness in the calves or thighs Skin: Skin color, texture, turgor normal. No rashes or lesions Incision/Wound:  Disposition: 01-Home or Self Care  Discharge Orders   Future Appointments Provider Department Dept Phone   06/30/2013 9:00 AM [facility] [phone]   06/30/2013 9:30 AM Victorino December, MD [facility] [phone]   Future Orders Complete By Expires   Diet - low sodium heart healthy  As directed    Discharge patient  As directed    Increase activity slowly  As directed    May walk up steps  As directed  No wound care  As directed        Medication List         gabapentin 100 MG capsule  Commonly known as:  NEURONTIN  Take 100 mg by mouth 3 (three) times daily.     ibuprofen 800 MG tablet  Commonly known as:  ADVIL,MOTRIN  Take 1 tablet (800 mg total) by mouth every 8 (eight) hours as needed (mild pain).     ibuprofen 200 MG tablet  Commonly known as:  ADVIL,MOTRIN  Take 400 mg by mouth every 6 (six) hours as needed for headache.     multivitamin tablet  Take 1 tablet by mouth daily.     oxyCODONE-acetaminophen 5-325 MG per tablet  Commonly known as:   PERCOCET/ROXICET  Take 1-2 tablets by mouth every 4 (four) hours as needed for severe pain (moderate to severe pain (when tolerating fluids)).     tamoxifen 20 MG tablet  Commonly known as:  NOLVADEX  Take 20 mg by mouth daily.     Vitamin D3 2000 UNITS Tabs  Take 2,000 Units by mouth daily.     zolpidem 5 MG tablet  Commonly known as:  AMBIEN  Take 1 tablet (5 mg total) by mouth at bedtime as needed for sleep.           Follow-up Information   Follow up with Serita Kyle, MD.   Specialty:  Obstetrics and Gynecology   Contact information:   [address] [place] [address] [phone]       Signed: Maxie Better A 12/24/2012, 7:26 AM

## 2012-12-24 NOTE — Progress Notes (Signed)
Subjective: Patient reports tolerating PO, + flatus and no problems voiding.    Objective: I have reviewed patient's vital signs.  vital signs, intake and output and labs. Filed Vitals:   12/24/12 0509  BP: 92/51  Pulse: 92  Temp: 98.8 F (37.1 C)  Resp: 16   I/O last 3 completed shifts: In: 3084.5 [P.O.:700; I.V.:2384.5] Out: 3830 [Urine:3750; Blood:80]    Lab Results  Component Value Date   WBC 8.3 12/24/2012   HGB 11.2* 12/24/2012   HCT 32.4* 12/24/2012   MCV 88.0 12/24/2012   PLT 114* 12/24/2012   Lab Results  Component Value Date   CREATININE 0.90 12/24/2012  reviewed prior creatinine levels. 0.8-0.9 in the past  EXAM General: alert, cooperative and no distress Resp: clear to auscultation bilaterally Cardio: regular rate and rhythm, S1, S2 normal, no murmur, click, rub or gallop GI: soft, non-tender; bowel sounds normal; no masses,  no organomegaly and incision: clean, dry and intact Extremities: no edema, redness or tenderness in the calves or thighs Vaginal Bleeding: minimal  Assessment: s/p Procedure(s): ROBOTIC ASSISTED TOTAL HYSTERECTOMY WITH BILATERAL SALPINGO OOPHORECTOMY: stable, progressing well and tolerating diet Hx right breast cancer w/ (+) RAD 51C gene Plan: Encourage ambulation Advance to PO medication Discharge home F/u 2 weeks D/c instructions reviewed w/ pt Script : motrin, percocet   LOS: 1 day    Carolyn [name] A, MD 12/24/2012 7:21 AM    12/24/2012, 7:21 AM

## 2012-12-25 NOTE — Op Note (Signed)
Carolyn [name], Carolyn [name]                 ACCOUNT NO.:  [id number]  MEDICAL RECORD NO.:  [id number]  LOCATION:  9318                          FACILITY:  [facility]  PHYSICIAN:  Maxie Better, M.D.DATE OF BIRTH:  [name] 16, 1970  DATE OF PROCEDURE:  12/23/2012 DATE OF DISCHARGE:  12/24/2012                              OPERATIVE REPORT   PREOPERATIVE DIAGNOSIS:  Personal history of stage IIB right breast cancer, RAD51C positive gene.  PROCEDURES:  Da Vinci robotic assisted total hysterectomy, bilateral salpingo-oophorectomy, and pelvic washing.  POSTOPERATIVE DIAGNOSIS:  Personal history of stage IIB breast cancer RAD51C positive gene.  ANESTHESIA:  General.  SURGEON:  Maxie Better, M.D.  ASSISTANT:  Darryl Nestle, MD.  INDICATIONS:  This is a 42 year old G0 single black female, status post mastectomy for stage IIB right invasive ductal carcinoma, who was found to have RAD51C positive gene, who now presents for prophylactic removal of her ovaries.  The patient also requested hysterectomy as well.  She is followed by Dr. Drue Second as her oncologist and she is currently on tamoxifen.  DESCRIPTION OF PROCEDURE:  Under adequate general anesthesia, the patient was positioned for robotic surgery.  She was sterilely prepped and draped in usual fashion.  A three-way Foley was sterilely placed. The patient's vagina was very atrophic reddened.  Examination under anesthesia revealed about 8 week size uterus.  No adnexal masses could be appreciated.  A weighted speculum was placed in the vagina.  Sims retractor was placed anteriorly.  The cervix was small, 0 Vicryl was placed on the anterior and posterior lip of the cervix.  The uterus sounded to 6 cm.  Therefore, a small RUMI cup with a #6 manipulator was introduced into the uterine cavity.  However, after placement it was noted that there was still not snug against the cervix and was actually bigger than what was needed for this  patient's cervical size.  It was then switched out to the smaller uterine manipulator, which was already had automatic placement and it was then placed for manipulation of the uterus, and the retractors removed.  Attention was then turned to the abdomen.  The patient was a thin female.  Supraumbilical incision was made after 0.25% Marcaine injection.  Veress needle was introduced, pressure of 4 was noted, and 2.2 L of CO2 was insufflated.  Veress needle was removed.  A 12 mm disposable trocar with sleeve was introduced into the abdomen without incident.  The robotic camera was then placed through that port.  The patient was carefully placed in Trendelenburg position and the pelvis inspected.  The uterus had some small surface fibroid and was then appeared to have a dominant posterior fibroid.  Liver edges were noted to be normal.  Due to her small frame two robot ports were made in the right and left lower quadrant, and the assistant port was placed in the right upper quadrant.  These were done under direct visualization.  A 500 mL of saline was instilled in the abdomen and then they suctioned for pelvic washings.  The robot was then docked onto the patient's left side, and arm #1 the monopolar scissors was placed.  On arm#2, the PK dissector  was then placed.  I then went to the surgical console.  At the surgical console, we were having difficulty manipulating the uterus with the uterine manipulator.  The procedure was started by identifying the ureter, which both were easily seen.  The left ureter was diving deeper in the pelvis then what was expected.  Nonetheless, the left retroperitoneal space was then opened and a window was created in the peritoneum and IP ligament was isolated near the pelvic brim, clamped, serially cauterized, and then cut.  The peritoneum on that side was then carried down towards the broad ligament, and towards the uterus to drop the ureter down further.   In doing so, it was then the left ovary had a cystic mass, which was attached partially posterior to the peritoneum as well as the uterus. This was carefully dissected off, incidental rupture of the cyst occurred at that point, and further allowed for the removal of the ovary after this attachments laterally.  Once the peritoneum was displaced inferiorly, and therefore, the ureter displaced inferiorly; attention was then turned to the left round ligament, which was then serially clamped, cauterized, and cut.  The anterior leaf of the broad ligament opened towards the bladder peritoneum, however, part of the vesicouterine peritoneum was opened, but she was still having difficulty delineating clearly the bladder flap at that point.  It was then felt that the uterine manipulator was not working effectively, and therefore, I went back to the vagina.  The manipulator was removed.  The uterus was resounded to about 10 cm deviated towards the patient's left side.  A #10 uterine manipulator was then introduced into the uterus without incident, and I then went back to the surgical console and better manipulation of the uterus was then accomplished.  It was noted that the patient has no evidence of endometriosis in the anterior and posterior cul-de- sac.  Posteriorly there appeared to be a large intramural/ subserosal fibroid and the patient had a very prominent sacral promontory, which was holding this fibroid back.  The right ovary appeared normal.  The posterior leaf of the broad ligaments was carried all the way posteriorly to the backside of the uterus above the uterosacral ligaments and open that peritoneum serosal surface.  Anteriorly, the bladder was able to be better delineated and the vesicouterine peritoneum was opened further and sharp dissection resulted in the bladder being displaced inferiorly over the RUMI cup.  The uterine vessels on the left were then better seen, clamped,  cauterized, and then subsequently cut.  The attention was then turned to the opposite side, the retroperitoneal space on the right was then opened.  The ureter was followed from the pelvic brim and also went deep down into the pelvis. The retroperitoneal space was opened.  Retroperitoneal dissection was done there and isolated the ovarian vessels at the brim. They were then clamped, cauterized, and then cut.  The underside of the peritoneum above the ureter was carried to the lateral side of the uterus.  The medial peritoneum was then pulled up and the ureter was dissected further away from the area of surgery.  Anteriorly, the round ligament was then clamped, cauterized, and cut.  The remaining portion the vesicouterine peritoneum was then opened transversely.  The bladder was displaced further inferiorly. In doing so, there was clear visualization of the uterine vessels with the ureter on the right clearly being seen diving under the uterine artery clearly away from the area of surgery.  The uterine vessels were  then skeletonized further serially clamped, cauterized, and then cut. Once this was done, the cervicovaginal junction above the KOH ring was then opened transversely, the vagina was detached from the cervix circumferentially.  The uterus was then detached completely along with the tubes and ovaries.  The uterus was then attempted to be brought through the vagina , however, it was limited bythe posterior uterine fibroids.  Therefore, decision was then made to start the bivalve the uterus posteriorly and it then resulted in exteriorizing the intracavitary fibroid that was present.  Traction with a single-tooth tenaculum on that fibroid to also to collapse the uterine wall bilaterally was more successful with subsequent being able to remove the uterus along with the fibroid and tubes and ovaries through the vagina.  Once this was done, the left ovary and tube was tagged with a stitch  to identify a right versus the left side.  The vaginal insufflator was then reinserted in the vagina.  The vaginal cuff was inspected and bleeding noted posteriorly were cauterized with the PK dissector.  It was noted that there was a small laceration of the vaginal cuff anteriorly.  The instruments were then replaced by the long tipped forceps on the large suture needle driver.  The bladder was further the displaced inferiorly with dissection and isolated a small laceration  in the vaginal cuff anteriorly.  A 0 Vicryl figure-of-eight sutures was then placed x3 with approximation of that area first followed by 0 V- Loc sutures x2 to close the vaginal cuff.  Once this was closure was done, it was then noted that there was bleeding anteriorly and several figure-of-eight sutures of 0 Vicryl was placed, continued bleeding was noted.  It was then noted that there was actually area that could be cauterized lateral to the vertical repair and cauterization was done with good hemostasis noted.  The ureters were noted both bilaterally peristalsing well.  Abdomen was then irrigated and suctioned of debris. The gas was then partially deflated to check for hemostasis and good hemostasis was still noted.  At that point, the procedure was felt to be complete.  The vaginal cuff was inspected vaginally with digital and it was felt to be well-approximated.  The robotic instruments were removed and the robot undocked, and then I went back to the patient's bedside. Pelvis was inspected again with good hemostasis noted.  The ports were then removed under direct visualization including the supraumbilical site.  Once these were closed with the 4-0 Vicryl was used for the subcuticular stitches and 0 Vicryl identifying the fascia was used in the supraumbilical site followed by 4-0 Vicryl for closure of that site.  I reinspected the vagina digitally and it was well-approximated, however, encountered bleeding, which  was like a first-degree perineal laceration, which was then closed with 3-0 chromic sutures in the usual fashion.  No other bleeders or the areas of concern was noted.  Specimen was uterus, cervix, tubes, ovaries, and pelvic washings all sent to Pathology.  ESTIMATED BLOOD LOSS:  80 mL.  INTRAOPERATIVE FLUID:  2 L.  URINE OUTPUT:  250 mL of amber colored urine.  COUNTS:  Sponge and instrument counts x2 was correct.  COMPLICATION: first degree perineal laceration repaired.  The patient tolerated the procedure well and was transferred to recovery room in stable condition.     Maxie Better, M.D.     [place]/MED[facility]  D:  12/23/2012  T:  12/25/2012  Job:  [id number]

## 2013-01-17 ENCOUNTER — Other Ambulatory Visit: Payer: Self-pay | Admitting: Oncology

## 2013-01-17 DIAGNOSIS — C50911 Malignant neoplasm of unspecified site of right female breast: Secondary | ICD-10-CM

## 2013-05-20 ENCOUNTER — Other Ambulatory Visit: Payer: Self-pay

## 2013-05-20 DIAGNOSIS — Z1231 Encounter for screening mammogram for malignant neoplasm of breast: Secondary | ICD-10-CM

## 2013-05-20 DIAGNOSIS — Z9889 Other specified postprocedural states: Secondary | ICD-10-CM

## 2013-05-20 DIAGNOSIS — Z853 Personal history of malignant neoplasm of breast: Secondary | ICD-10-CM

## 2013-05-20 DIAGNOSIS — Z9011 Acquired absence of right breast and nipple: Secondary | ICD-10-CM

## 2013-06-11 ENCOUNTER — Telehealth: Payer: Self-pay

## 2013-06-11 NOTE — Telephone Encounter (Signed)
Returned pt call requesting change of provider to Dr. [name].  Let pt know KK will not be returning and that June appt would be rescheduled, she should expect to hear from scheduling.  Let pt know I would give her request to Dr. [name].  Pt voiced understanding.

## 2013-06-22 ENCOUNTER — Other Ambulatory Visit: Payer: Self-pay | Admitting: Oncology

## 2013-06-24 ENCOUNTER — Telehealth: Payer: Self-pay | Admitting: Hematology and Oncology

## 2013-06-24 NOTE — Telephone Encounter (Signed)
Patient calling back to confirm appt. She received voicemail today regarding appt with new physician for 09/2013. Informed patient that we are working diligently in getting all patients in, and it may be at least September before Dr [name] would be able to see her.  She is understanding yet insistent on seeing Dr [name].  Will forward to scheduling staff for adjustment in Carolyn [name] to be changed.

## 2013-06-25 ENCOUNTER — Telehealth: Payer: Self-pay

## 2013-06-25 NOTE — Telephone Encounter (Signed)
Pt left message - she had been to a doctor who said her white count and platelets were low and she should left clinic know.  Attempted to contact pt to have copy of labs sent.  No answer.

## 2013-06-26 ENCOUNTER — Telehealth: Payer: Self-pay | Admitting: *Deleted

## 2013-06-26 NOTE — Telephone Encounter (Signed)
Received message from Lannette Donath stating that the pt is requesting Dr. [name].  Called and left a message for the pt to return my call so I can get her scheduled.

## 2013-06-26 NOTE — Telephone Encounter (Signed)
Pt returned my call and I confirmed 09/24/13 appt w/ her.  Cancelled Dr. Geralyn Flash appt per pts request.  Pt stating that her white count and Platelets are low so I gave that to Val to look into.

## 2013-06-27 ENCOUNTER — Telehealth: Payer: Self-pay

## 2013-06-27 NOTE — Telephone Encounter (Signed)
LMOVM - returned pt call re lab values.  Let pt know we would need a copy of the labs so a doctor can review.  Pt to call clinic for problems.

## 2013-06-30 ENCOUNTER — Ambulatory Visit: Payer: BC Managed Care – PPO | Admitting: Oncology

## 2013-06-30 ENCOUNTER — Other Ambulatory Visit: Payer: BC Managed Care – PPO

## 2013-07-01 ENCOUNTER — Telehealth: Payer: Self-pay | Admitting: Oncology

## 2013-07-01 NOTE — Telephone Encounter (Signed)
pt cld to check for the time & date for appt-gave pt time & date-pt understood

## 2013-07-07 ENCOUNTER — Ambulatory Visit
Admission: RE | Admit: 2013-07-07 | Discharge: 2013-07-07 | Disposition: A | Discharge status: Home / Self Care | Payer: BC Managed Care – PPO | Source: Ambulatory Visit

## 2013-07-07 ENCOUNTER — Ambulatory Visit: Payer: BC Managed Care – PPO

## 2013-07-07 DIAGNOSIS — Z9011 Acquired absence of right breast and nipple: Secondary | ICD-10-CM

## 2013-07-07 DIAGNOSIS — Z853 Personal history of malignant neoplasm of breast: Secondary | ICD-10-CM

## 2013-07-21 ENCOUNTER — Other Ambulatory Visit: Payer: Self-pay | Admitting: Oncology

## 2013-07-21 DIAGNOSIS — C50919 Malignant neoplasm of unspecified site of unspecified female breast: Secondary | ICD-10-CM

## 2013-09-18 ENCOUNTER — Other Ambulatory Visit: Payer: BC Managed Care – PPO

## 2013-09-18 ENCOUNTER — Ambulatory Visit: Payer: BC Managed Care – PPO | Admitting: Hematology and Oncology

## 2013-09-24 ENCOUNTER — Other Ambulatory Visit (HOSPITAL_BASED_OUTPATIENT_CLINIC_OR_DEPARTMENT_OTHER): Payer: BC Managed Care – PPO

## 2013-09-24 ENCOUNTER — Ambulatory Visit (HOSPITAL_BASED_OUTPATIENT_CLINIC_OR_DEPARTMENT_OTHER): Payer: BC Managed Care – PPO | Admitting: Oncology

## 2013-09-24 VITALS — BP 106/65 | HR 82 | Temp 98.0°F | Resp 20 | Ht 68.0 in | Wt 133.7 lb

## 2013-09-24 DIAGNOSIS — C773 Secondary and unspecified malignant neoplasm of axilla and upper limb lymph nodes: Secondary | ICD-10-CM

## 2013-09-24 DIAGNOSIS — Z90710 Acquired absence of both cervix and uterus: Secondary | ICD-10-CM

## 2013-09-24 DIAGNOSIS — C50911 Malignant neoplasm of unspecified site of right female breast: Secondary | ICD-10-CM

## 2013-09-24 DIAGNOSIS — Z170 Estrogen receptor positive status [ER+]: Secondary | ICD-10-CM

## 2013-09-24 DIAGNOSIS — C50419 Malignant neoplasm of upper-outer quadrant of unspecified female breast: Secondary | ICD-10-CM

## 2013-09-24 DIAGNOSIS — Z9079 Acquired absence of other genital organ(s): Secondary | ICD-10-CM

## 2013-09-24 DIAGNOSIS — Z90722 Acquired absence of ovaries, bilateral: Secondary | ICD-10-CM

## 2013-09-24 DIAGNOSIS — C50411 Malignant neoplasm of upper-outer quadrant of right female breast: Secondary | ICD-10-CM | POA: Insufficient documentation

## 2013-09-24 LAB — CBC WITH DIFFERENTIAL/PLATELET
BASO%: 0.3 % (ref 0.0–2.0)
Basophils Absolute: 0 10*3/uL (ref 0.0–0.1)
EOS%: 0.6 % (ref 0.0–7.0)
Eosinophils Absolute: 0 10*3/uL (ref 0.0–0.5)
HCT: 36.8 % (ref 34.8–46.6)
HEMOGLOBIN: 12.8 g/dL (ref 11.6–15.9)
LYMPH%: 49.9 % — ABNORMAL HIGH (ref 14.0–49.7)
MCH: 30.5 pg (ref 25.1–34.0)
MCHC: 34.8 g/dL (ref 31.5–36.0)
MCV: 87.6 fL (ref 79.5–101.0)
MONO#: 0.3 10*3/uL (ref 0.1–0.9)
MONO%: 9.7 % (ref 0.0–14.0)
NEUT#: 1.4 10*3/uL — ABNORMAL LOW (ref 1.5–6.5)
NEUT%: 39.5 % (ref 38.4–76.8)
Platelets: 130 10*3/uL — ABNORMAL LOW (ref 145–400)
RBC: 4.2 10*6/uL (ref 3.70–5.45)
RDW: 11.8 % (ref 11.2–14.5)
WBC: 3.5 10*3/uL — ABNORMAL LOW (ref 3.9–10.3)
lymph#: 1.7 10*3/uL (ref 0.9–3.3)

## 2013-09-24 LAB — COMPREHENSIVE METABOLIC PANEL (CC13)
ALBUMIN: 3.8 g/dL (ref 3.5–5.0)
ALK PHOS: 126 U/L (ref 40–150)
ALT: 11 U/L (ref 0–55)
AST: 20 U/L (ref 5–34)
Anion Gap: 9 mEq/L (ref 3–11)
BUN: 11.5 mg/dL (ref 7.0–26.0)
CALCIUM: 9.5 mg/dL (ref 8.4–10.4)
CHLORIDE: 104 meq/L (ref 98–109)
CO2: 29 mEq/L (ref 22–29)
Creatinine: 0.9 mg/dL (ref 0.6–1.1)
Glucose: 69 mg/dl — ABNORMAL LOW (ref 70–140)
POTASSIUM: 4.3 meq/L (ref 3.5–5.1)
SODIUM: 142 meq/L (ref 136–145)
TOTAL PROTEIN: 7.2 g/dL (ref 6.4–8.3)
Total Bilirubin: 0.45 mg/dL (ref 0.20–1.20)

## 2013-09-24 NOTE — Progress Notes (Signed)
Carolyn [name]  [phone] [phone]     ID: Carolyn [name] DOB: [birth year]  MR#: [id number]  GGE#:[id number]  Patient Care Team: [name], MD as PCP - General (Family Medicine) Thea Silversmith, MD (Radiation Oncology) Haywood Lasso, MD as Surgeon (General Surgery) Chauncey Cruel, MD as Consulting Physician (Oncology)   CHIEF COMPLAINT: Estrogen receptor positive breast cancer  CURRENT TREATMENT: Tamoxifen   BREAST CANCER HISTORY: From doctor Khan's of original intake node 05/31/2011:  "Carolyn [name] is a 43 y.o. female. Without significant past medical history. She underwent a mammogram that showed calcifications measuring 5 cm on the right breast. She then went on To have an ultrasound which showed the area to measure 2.9 cm. She was also found to have a right axillary lymph node that was suspicious for a malignancy. She had a biopsy of the calcifications that showed high-grade ductal carcinoma in situ. 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 In the lymph node biopsy there was no lymphatic tissue seen and was felt that the node was replaced by tumor. Patient went on to have an MRI of the bilateral breasts performed on evening of 05/30/2011. The MRI showed in the right breast 5 x 3 x 4.5 cm mass abutting the chest wall. About 7 cm away from this there was another mass in the upper inner quadrant that measured 1.5 x 1.7 x 1.5 cm. On the contralateral breast, that is the left breast a 2 cm area suspicious enhancement was noted also this up to date has not been biopsied and arrangements are being made for the biopsy to be performed. In this side there were no suspicious lymph nodes. The prognostic panel is pending. Patient is otherwise without any complaints. "  Her subsequent history is as detailed below  INTERVAL HISTORY: Carolyn [name] returns today for followup of her breast cancer. She is establishing  herself on my service today.  REVIEW OF SYSTEMS: She had bilateral salpingo-oophorectomy and hysterectomy December 2014. She is doing remarkably well as far as menopausal symptoms are concerned. She was having problems with hot flashes, but was started on gabapentin and she is tolerating that well and getting a good results. Vaginal dryness is "not a big problem", she denies insomnia, she's been able to not gain weight, and she tells me her mood is "fine". She has not yet had a bone density. She is tolerating tamoxifen with no vaginal wetness problems. She just had her eyes checked and she does not have any cataracts. Aside from these issues a detailed review of systems today was benign  PAST MEDICAL HISTORY: Past Medical History  Diagnosis Date  . 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 Hx of radiation therapy 11/08/11 -12/26/11    right chest wall/supraclav fossa, right scar  . History of chemotherapy 06/27/11 -09/04/11    neoadjuvant    PAST SURGICAL HISTORY: Past Surgical History  Procedure Laterality Date  . Wisdom tooth extraction    . Portacath placement  06/20/2011    Procedure: INSERTION PORT-A-CATH;  Surgeon: Haywood Lasso, MD;  Location: [facility];  Service: General;  Laterality: N/A;  . Modified mastectomy  10/03/2011    Procedure: MODIFIED MASTECTOMY;  Surgeon: Haywood Lasso, MD;  Location: [facility];  Service: General;  Laterality: Right;  . Port-a-cath removal  01/30/2012    Procedure: MINOR REMOVAL PORT-A-CATH;  Surgeon: Darrick Meigs  Fanny Bien, MD;  Location: [facility];  Service: General;  Laterality: Left;  . Breast biopsy      Left  . Robotic assisted total hysterectomy with bilateral salpingo oopherectomy Bilateral 12/23/2012    Procedure: ROBOTIC ASSISTED TOTAL HYSTERECTOMY WITH BILATERAL SALPINGO OOPHORECTOMY;  Surgeon: Marvene Staff, MD;  Location: [facility];  Service: Gynecology;   Laterality: Bilateral;    FAMILY HISTORY Family History  Problem Relation Age of Onset  . Breast cancer Maternal Aunt 11  . Pancreatic cancer Maternal Grandfather   . Pancreatic cancer Maternal Aunt     died in her 46s  . Leukemia Maternal Aunt     died in her 59s  . Ovarian cancer Cousin 24    maternal cousin   the patient's father is alive, currently 64 years old. The patient's mother died from complications of diabetes at the age of 33. The patient had no brothers, 4 sisters. One sister has died from congestive heart failure. The patient's mother is 40 sisters. One of them was diagnosed with breast cancer the age of 60. Also a cousin on the maternal side it was diagnosed with ovarian cancer. This was approximately age 61. There is also colon cancer in the family. The patient has had extensive genetic testing summarized below. She is however BRCA negative  GYNECOLOGIC HISTORY:  No LMP recorded. Patient is not currently having periods (Reason: Chemotherapy). Menarche age 31, she is GX P0. She underwent total hysterectomy with bilateral salpingo-oophorectomy December 2014.  SOCIAL HISTORY:  Carolyn [name] works at [employer] for [employer]. She mostly deals with government documents. She is single, lives by herself, with no pets. She attends [employer] locally.    ADVANCED DIRECTIVES: Not in place. The patient has a documents and intends to name her sister Carolyn [name] as healthcare power of attorney. Carolyn [name] lives in [place] and can be reached at [place]: History  Substance Use Topics  . Smoking status: Never Smoker   . Smokeless tobacco: Never Used  . Alcohol Use: No     Colonoscopy:  PAP:  Bone density:  Lipid panel:  Allergies  Allergen Reactions  . Allegra [Fexofenadine] Hives    Abdomen only  . Shellfish Allergy Hives    Abdomen only    Current Outpatient Prescriptions  Medication Sig Dispense Refill  . Cholecalciferol (VITAMIN D3) 2000 UNITS  TABS Take 2,000 Units by mouth daily.      Marland Kitchen gabapentin (NEURONTIN) 100 MG capsule Take 100 mg by mouth 3 (three) times daily.      Marland Kitchen ibuprofen (ADVIL,MOTRIN) 200 MG tablet Take 400 mg by mouth every 6 (six) hours as needed for headache.      . ibuprofen (ADVIL,MOTRIN) 800 MG tablet Take 1 tablet (800 mg total) by mouth every 8 (eight) hours as needed (mild pain).  30 tablet  0  . Multiple Vitamin (MULTIVITAMIN) tablet Take 1 tablet by mouth daily.      Marland Kitchen oxyCODONE-acetaminophen (PERCOCET/ROXICET) 5-325 MG per tablet Take 1-2 tablets by mouth every 4 (four) hours as needed for severe pain (moderate to severe pain (when tolerating fluids)).  30 tablet  0  . tamoxifen (NOLVADEX) 20 MG tablet Take 20 mg by mouth daily.      . tamoxifen (NOLVADEX) 20 MG tablet TAKE ONE TABLET BY MOUTH ONCE DAILY  90 tablet  0  . zolpidem (AMBIEN) 5 MG tablet Take 1 tablet (5 mg total) by mouth at bedtime as needed for  sleep.  30 tablet  0   No current facility-administered medications for this visit.    OBJECTIVE: Middle-aged [place] American woman who appears stated age 19 Vitals:   09/24/13 1447  BP: 106/65  Pulse: 82  Temp: 98 F (36.7 C)  Resp: 20     Body mass index is 20.33 kg/(m^2).    ECOG FS:0 - Asymptomatic  Ocular: Sclerae unicteric, pupils round and reactive Ear-nose-throat: Oropharynx clear and moist Lymphatic: No cervical or supraclavicular adenopathy Lungs no rales or rhonchi, good excursion bilaterally Heart regular rate and rhythm, no murmur appreciated Abd soft, nontender, positive bowel sounds MSK no focal spinal tenderness, no upper extremity lymphedema Neuro: non-focal, well-oriented, appropriate affect Breasts: Status post right mastectomy. There is no evidence of chest wall recurrence. The right axilla is benign. Left breast is unremarkable.  LAB RESULTS:  CMP     Component Value Date/Time   NA 142 09/24/2013 1415   NA 139 12/24/2012 0520   K 4.3 09/24/2013 1415   K 3.7  12/24/2012 0520   CL 105 12/24/2012 0520   CL 102 01/16/2012 0804   CO2 29 09/24/2013 1415   CO2 28 12/24/2012 0520   GLUCOSE 69* 09/24/2013 1415   GLUCOSE 96 12/24/2012 0520   GLUCOSE 92 01/16/2012 0804   BUN 11.5 09/24/2013 1415   BUN 10 12/24/2012 0520   CREATININE 0.9 09/24/2013 1415   CREATININE 0.90 12/24/2012 0520   CALCIUM 9.5 09/24/2013 1415   CALCIUM 8.5 12/24/2012 0520   PROT 7.2 09/24/2013 1415   PROT 7.6 09/29/2011 1430   ALBUMIN 3.8 09/24/2013 1415   ALBUMIN 4.3 09/29/2011 1430   AST 20 09/24/2013 1415   AST 25 09/29/2011 1430   ALT 11 09/24/2013 1415   ALT 21 09/29/2011 1430   ALKPHOS 126 09/24/2013 1415   ALKPHOS 101 09/29/2011 1430   BILITOT 0.45 09/24/2013 1415   BILITOT 0.3 09/29/2011 1430   GFRNONAA 77* 12/24/2012 0520   GFRAA 89* 12/24/2012 0520    I No results found for this basename: SPEP,  UPEP,   kappa and lambda light chains    Lab Results  Component Value Date   WBC 3.5* 09/24/2013   NEUTROABS 1.4* 09/24/2013   HGB 12.8 09/24/2013   HCT 36.8 09/24/2013   MCV 87.6 09/24/2013   PLT 130* 09/24/2013      Chemistry      Component Value Date/Time   NA 142 09/24/2013 1415   NA 139 12/24/2012 0520   K 4.3 09/24/2013 1415   K 3.7 12/24/2012 0520   CL 105 12/24/2012 0520   CL 102 01/16/2012 0804   CO2 29 09/24/2013 1415   CO2 28 12/24/2012 0520   BUN 11.5 09/24/2013 1415   BUN 10 12/24/2012 0520   CREATININE 0.9 09/24/2013 1415   CREATININE 0.90 12/24/2012 0520      Component Value Date/Time   CALCIUM 9.5 09/24/2013 1415   CALCIUM 8.5 12/24/2012 0520   ALKPHOS 126 09/24/2013 1415   ALKPHOS 101 09/29/2011 1430   AST 20 09/24/2013 1415   AST 25 09/29/2011 1430   ALT 11 09/24/2013 1415   ALT 21 09/29/2011 1430   BILITOT 0.45 09/24/2013 1415   BILITOT 0.3 09/29/2011 1430       Lab Results  Component Value Date   LABCA2 24 05/31/2011    No components found with this basename: [id number]    No results found for this basename: INR,  in the last 168  hours  Urinalysis  No results found for this basename: colorurine,  appearanceur,  labspec,  phurine,  glucoseu,  hgbur,  bilirubinur,  ketonesur,  proteinur,  urobilinogen,  nitrite,  leukocytesur    STUDIES: No results found.  ASSESSMENT: 43 y.o. BRCA negative [place] woman status post right breast upper outer quadrant and right axillary lymph node biopsy 05/18/2011, both positive for an invasive ductal carcinoma, high-grade, clinicallyT2 N1-2 or stage IIB/IIIA,  estrogen receptor 100% positive, progesterone receptor 87% positive, with an MIB-1 of 14% and no HER-2 amplification ([id number]).  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 Treated neoadjuvantly with cyclophosphamide and docetaxel x4 completed 08/29/2011.  (3) status post right modified radical mastectomy 10/03/2011 showing a residual pT1c pN1a (3/18 lymph nodes positive) invasive ductal carcinoma, grade 1,estrogen receptor 100% positive, progesterone receptor negative, with no HER-2 amplification ([id number])  (4) adjuvant radiation to the right chest wall, right supraclavicular fossa and right scar completed 12/26/2011  (5) tamoxifen started January 2014  (6) status post total hysterectomy with bilateral salpingo-oophorectomy 12/23/2012 with benign pathology ([id number])  (7) anterolateral mastectomy pending, to be followed by reconstruction  (8) colonoscopy pending  PLAN: I spent approximately one hour today with Ether going over her situation. She understands she had a stage II (or possibly early stage III) breast cancer diagnosed in May of 2013. She was treated neoadjuvantly with 4 cycles of standard  chemotherapy, followed by right modified radical mastectomy which showed significant residual disease. She completed radiation to optimize local control and was started on tamoxifen January of 2014.  Because of her family history and in any changes she also underwent total hysterectomy with salpingo-oophorectomy. Likely she should have a colonoscopy by age 30 as well and I will try to set that up before the end of the year if possible.  She is continuing on systemic therapy with anti-estrogens. She understands of these very inexpensive tablets are actually the most effective treatment for her cancer, as they will cut in half her risk of recurrence (chemotherapy cut that risk by one third). She is tolerating the tamoxifen well, and she is having surprisingly few menopausal symptoms.  Her next step is to left simple mastectomy and she already has an appointment with Dr. Donne Hazel for next month to set that up.  We discussed the fact that 10 years of tamoxifen, or  5 years of tamoxifen followed by  5 years of an aromatase inhibitor, or perhaps 2 years of tamoxifen followed by 3 years of an aromatase inhibitor, may all give her similar rates of risk reduction. Our first decision point will be January 2014, which she completes 2 years of tamoxifen, and accordingly I will see her in February of 2014 so we can discuss possibly changing to an aromatase inhibitor at that time.   Carolyn [name]  has a good understanding of the overall plan. She agrees with it. She knows the goal of treatment in her case is cure. She will call with any problems that may develop before her next visit here.  Chauncey Cruel, MD   09/24/2013 3:03 PM

## 2013-09-25 ENCOUNTER — Telehealth: Payer: Self-pay | Admitting: Oncology

## 2013-09-25 NOTE — Telephone Encounter (Signed)
, °

## 2013-09-25 NOTE — Telephone Encounter (Signed)
Sent letter to Dr. Verdia Kuba office  from Dr. [name].

## 2013-10-02 ENCOUNTER — Other Ambulatory Visit: Payer: Self-pay | Admitting: Oncology

## 2013-10-02 DIAGNOSIS — C50411 Malignant neoplasm of upper-outer quadrant of right female breast: Secondary | ICD-10-CM

## 2013-10-03 ENCOUNTER — Other Ambulatory Visit: Payer: Self-pay | Admitting: *Deleted

## 2013-11-04 ENCOUNTER — Other Ambulatory Visit: Payer: Self-pay | Admitting: Oncology

## 2013-11-04 ENCOUNTER — Ambulatory Visit (INDEPENDENT_AMBULATORY_CARE_PROVIDER_SITE_OTHER): Payer: BC Managed Care – PPO | Admitting: General Surgery

## 2013-11-17 ENCOUNTER — Telehealth (INDEPENDENT_AMBULATORY_CARE_PROVIDER_SITE_OTHER): Payer: Self-pay

## 2013-11-17 DIAGNOSIS — C50911 Malignant neoplasm of unspecified site of right female breast: Secondary | ICD-10-CM

## 2013-11-17 NOTE — Telephone Encounter (Signed)
Order placed for breast mri per Dr Donne Hazel.

## 2014-02-23 ENCOUNTER — Ambulatory Visit (HOSPITAL_BASED_OUTPATIENT_CLINIC_OR_DEPARTMENT_OTHER): Payer: BC Managed Care – PPO | Admitting: Oncology

## 2014-02-23 ENCOUNTER — Other Ambulatory Visit (HOSPITAL_BASED_OUTPATIENT_CLINIC_OR_DEPARTMENT_OTHER): Payer: BC Managed Care – PPO

## 2014-02-23 ENCOUNTER — Telehealth: Payer: Self-pay | Admitting: Oncology

## 2014-02-23 VITALS — BP 115/61 | HR 92 | Temp 98.6°F | Resp 18 | Ht 68.0 in | Wt 135.0 lb

## 2014-02-23 DIAGNOSIS — C50411 Malignant neoplasm of upper-outer quadrant of right female breast: Secondary | ICD-10-CM

## 2014-02-23 DIAGNOSIS — N951 Menopausal and female climacteric states: Secondary | ICD-10-CM

## 2014-02-23 DIAGNOSIS — Z90710 Acquired absence of both cervix and uterus: Secondary | ICD-10-CM

## 2014-02-23 DIAGNOSIS — C50811 Malignant neoplasm of overlapping sites of right female breast: Secondary | ICD-10-CM

## 2014-02-23 DIAGNOSIS — C773 Secondary and unspecified malignant neoplasm of axilla and upper limb lymph nodes: Secondary | ICD-10-CM

## 2014-02-23 DIAGNOSIS — Z9079 Acquired absence of other genital organ(s): Secondary | ICD-10-CM

## 2014-02-23 DIAGNOSIS — Z90722 Acquired absence of ovaries, bilateral: Secondary | ICD-10-CM

## 2014-02-23 LAB — COMPREHENSIVE METABOLIC PANEL (CC13)
ALBUMIN: 3.9 g/dL (ref 3.5–5.0)
ALT: 14 U/L (ref 0–55)
AST: 19 U/L (ref 5–34)
Alkaline Phosphatase: 126 U/L (ref 40–150)
Anion Gap: 11 mEq/L (ref 3–11)
BUN: 14.7 mg/dL (ref 7.0–26.0)
CO2: 29 mEq/L (ref 22–29)
CREATININE: 0.8 mg/dL (ref 0.6–1.1)
Calcium: 9.7 mg/dL (ref 8.4–10.4)
Chloride: 105 mEq/L (ref 98–109)
EGFR: >90 mL/min/{1.73_m2} (ref >90)
Glucose: 83 mg/dl (ref 70–140)
POTASSIUM: 3.9 meq/L (ref 3.5–5.1)
SODIUM: 145 meq/L (ref 136–145)
Total Bilirubin: 0.31 mg/dL (ref 0.20–1.20)
Total Protein: 7.3 g/dL (ref 6.4–8.3)

## 2014-02-23 LAB — CBC WITH DIFFERENTIAL/PLATELET
BASO%: 0.3 % (ref 0.0–2.0)
BASOS ABS: 0 10*3/uL (ref 0.0–0.1)
EOS%: 0.4 % (ref 0.0–7.0)
Eosinophils Absolute: 0 10*3/uL (ref 0.0–0.5)
HCT: 39.8 % (ref 34.8–46.6)
HGB: 13 g/dL (ref 11.6–15.9)
LYMPH#: 1.9 10*3/uL (ref 0.9–3.3)
LYMPH%: 36.3 % (ref 14.0–49.7)
MCH: 29 pg (ref 25.1–34.0)
MCHC: 32.7 g/dL (ref 31.5–36.0)
MCV: 88.7 fL (ref 79.5–101.0)
MONO#: 0.3 10*3/uL (ref 0.1–0.9)
MONO%: 6.6 % (ref 0.0–14.0)
NEUT%: 56.4 % (ref 38.4–76.8)
NEUTROS ABS: 2.9 10*3/uL (ref 1.5–6.5)
PLATELETS: 147 10*3/uL (ref 145–400)
RBC: 4.49 10*6/uL (ref 3.70–5.45)
RDW: 12 % (ref 11.2–14.5)
WBC: 5.2 10*3/uL (ref 3.9–10.3)

## 2014-02-23 MED ORDER — VENLAFAXINE HCL ER 37.5 MG PO CP24: 37.5000 mg | ORAL_CAPSULE | Freq: Every day | ORAL | Status: DC

## 2014-02-23 MED ORDER — TAMOXIFEN CITRATE 20 MG PO TABS: 20.0000 mg | ORAL_TABLET | Freq: Every day | ORAL | Status: DC

## 2014-02-23 NOTE — Telephone Encounter (Signed)
, °

## 2014-02-23 NOTE — Progress Notes (Signed)
[place]  [phone] [phone]     ID: Carolyn [name] DOB: September 01, 1970  MR#: [id number]  MEQ#:[id number]  Patient Care Team: [name], MD as PCP - General (Family Medicine) Thea Silversmith, MD (Radiation Oncology) Haywood Lasso, MD as Surgeon (General Surgery) Carolyn Cruel, MD as Consulting Physician (Oncology) OTHER M.D.JN Collene Mares  CHIEF COMPLAINT: Estrogen receptor positive breast cancer  CURRENT TREATMENT: Tamoxifen   BREAST CANCER HISTORY: From doctor Khan's of original intake node 05/31/2011:  "Carolyn [name] is a 44 y.o. female. Without significant past medical history. She underwent a mammogram that showed calcifications measuring 5 cm on the right breast. She then went on To have an ultrasound which showed the area to measure 2.9 cm. She was also found to have a right axillary lymph node that was suspicious for a malignancy. She had a biopsy of the calcifications that showed high-grade ductal carcinoma in situ. 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 In the lymph node biopsy there was no lymphatic tissue seen and was felt that the node was replaced by tumor. Patient went on to have an MRI of the bilateral breasts performed on evening of 05/30/2011. The MRI showed in the right breast 5 x 3 x 4.5 cm mass abutting the chest wall. About 7 cm away from this there was another mass in the upper inner quadrant that measured 1.5 x 1.7 x 1.5 cm. On the contralateral breast, that is the left breast a 2 cm area suspicious enhancement was noted also this up to date has not been biopsied and arrangements are being made for the biopsy to be performed. In this side there were no suspicious lymph nodes. The prognostic panel is pending. Patient is otherwise without any complaints. "  Her subsequent history is as detailed below  INTERVAL HISTORY: Carolyn [name] returns today for followup of her left-sided estrogen  receptor positive breast cancer. She continues on tamoxifen, with generally good tolerance. She does have significant hot flashes and is taking gabapentin 3 times a day for this. The problem is this makes her kind of sleepy during the day. Vaginal wetness is not an issue. She has no other side effects from the tamoxifen that she is aware of.   REVIEW OF SYSTEMS: She is planning to have a left mastectomy with no reconstruction sometime in the near future. She had an episode of right upper quadrant discomfort which sounds very much like a gallbladder attack and her primary care physician has a ready set her up for the appropriate evaluation. Carolyn [name] has occasional pains here and there, but there are not more persistent or intense than before. Particularly her knees are a problem. This limits her activity somewhat. A detailed review of systems today was otherwise stable.   PAST MEDICAL HISTORY: Past Medical History  Diagnosis Date  . 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 Hx of radiation therapy 11/08/11 -12/26/11    right chest wall/supraclav fossa, right scar  . History of chemotherapy 06/27/11 -09/04/11    neoadjuvant    PAST SURGICAL HISTORY: Past Surgical History  Procedure Laterality Date  . Wisdom tooth extraction    . Portacath placement  06/20/2011    Procedure: INSERTION PORT-A-CATH;  Surgeon: Haywood Lasso, MD;  Location: [facility];  Service: General;  Laterality: N/A;  . Modified mastectomy  10/03/2011    Procedure: MODIFIED MASTECTOMY;  Surgeon: Darrick Meigs  Fanny Bien, MD;  Location: [facility];  Service: General;  Laterality: Right;  . Port-a-cath removal  01/30/2012    Procedure: MINOR REMOVAL PORT-A-CATH;  Surgeon: Haywood Lasso, MD;  Location: [facility];  Service: General;  Laterality: Left;  . Breast biopsy      Left  . Robotic assisted total hysterectomy with bilateral salpingo oopherectomy Bilateral  12/23/2012    Procedure: ROBOTIC ASSISTED TOTAL HYSTERECTOMY WITH BILATERAL SALPINGO OOPHORECTOMY;  Surgeon: Marvene Staff, MD;  Location: [facility];  Service: Gynecology;  Laterality: Bilateral;    FAMILY HISTORY Family History  Problem Relation Age of Onset  . Breast cancer Maternal Aunt 53  . Pancreatic cancer Maternal Grandfather   . Pancreatic cancer Maternal Aunt     died in her 95s  . Leukemia Maternal Aunt     died in her 6s  . Ovarian cancer Cousin 50    maternal cousin   the patient's father is alive, currently 30 years old. The patient's mother died from complications of diabetes at the age of 49. The patient had no brothers, 4 sisters. One sister has died from congestive heart failure. The patient's mother is one of 5 sisters. One of them was diagnosed with breast cancer the age of 56. Also a cousin on the maternal side it was diagnosed with ovarian cancer. This was approximately age 5. There is also colon cancer in the family. The patient has had extensive genetic testing summarized below. She is however BRCA negative  GYNECOLOGIC HISTORY:  No LMP recorded. Patient is not currently having periods (Reason: Chemotherapy). Menarche age 55, she is GX P0. She underwent total hysterectomy with bilateral salpingo-oophorectomy December 2014.  SOCIAL HISTORY:  Carolyn [name] works at [employer] for [employer]. She mostly deals with government documents. She is single, lives by herself, with no pets. She attends [employer] locally.    ADVANCED DIRECTIVES: Not in place. The patient has a documents and intends to name her sister Carolyn [name] as healthcare power of attorney. Carolyn [name] lives in [place] and can be reached at [place]: History  Substance Use Topics  . Smoking status: Never Smoker   . Smokeless tobacco: Never Used  . Alcohol Use: No     Colonoscopy:  PAP:  Bone density:  Lipid panel:  Allergies  Allergen Reactions  . Allegra [Fexofenadine]  Hives    Abdomen only  . Shellfish Allergy Hives    Abdomen only    Current Outpatient Prescriptions  Medication Sig Dispense Refill  . Cholecalciferol (VITAMIN D3) 2000 UNITS TABS Take 2,000 Units by mouth daily.    Marland Kitchen gabapentin (NEURONTIN) 100 MG capsule Take 100 mg by mouth 3 (three) times daily.    Marland Kitchen ibuprofen (ADVIL,MOTRIN) 200 MG tablet Take 400 mg by mouth every 6 (six) hours as needed for headache.    . ibuprofen (ADVIL,MOTRIN) 800 MG tablet Take 1 tablet (800 mg total) by mouth every 8 (eight) hours as needed (mild pain). 30 tablet 0  . Multiple Vitamin (MULTIVITAMIN) tablet Take 1 tablet by mouth daily.    Marland Kitchen oxyCODONE-acetaminophen (PERCOCET/ROXICET) 5-325 MG per tablet Take 1-2 tablets by mouth every 4 (four) hours as needed for severe pain (moderate to severe pain (when tolerating fluids)). 30 tablet 0  . tamoxifen (NOLVADEX) 20 MG tablet TAKE ONE TABLET BY MOUTH ONCE DAILY 90 tablet 1  . zolpidem (AMBIEN) 5 MG tablet Take 1 tablet (5 mg total) by mouth at bedtime as  needed for sleep. 30 tablet 0   No current facility-administered medications for this visit.    OBJECTIVE: Middle-aged [place] American woman in no acute distress Filed Vitals:   02/23/14 1539  BP: 115/61  Pulse: 92  Temp: 98.6 F (37 C)  Resp: 18     Body mass index is 20.53 kg/(m^2).    ECOG FS:1 - Symptomatic but completely ambulatory  Sclerae unicteric, pupils equal and reactive Oropharynx clear, teeth in good repair No cervical or supraclavicular adenopathy Lungs no rales or rhonchi Heart regular rate and rhythm Abd soft, nontender including palpation of the right upper quadrant, positive bowel sounds MSK no focal spinal tenderness, no upper extremity lymphedema Neuro: nonfocal, well oriented, positive affect Breasts: Status post right mastectomy with no evidence of chest wall recurrence. The right axilla is benign. The left breast is unremarkable.    LAB RESULTS:  CMP     Component Value  Date/Time   NA 145 02/23/2014 1524   NA 139 12/24/2012 0520   K 3.9 02/23/2014 1524   K 3.7 12/24/2012 0520   CL 105 12/24/2012 0520   CL 102 01/16/2012 0804   CO2 29 02/23/2014 1524   CO2 28 12/24/2012 0520   GLUCOSE 83 02/23/2014 1524   GLUCOSE 96 12/24/2012 0520   GLUCOSE 92 01/16/2012 0804   BUN 14.7 02/23/2014 1524   BUN 10 12/24/2012 0520   CREATININE 0.8 02/23/2014 1524   CREATININE 0.90 12/24/2012 0520   CALCIUM 9.7 02/23/2014 1524   CALCIUM 8.5 12/24/2012 0520   PROT 7.3 02/23/2014 1524   PROT 7.6 09/29/2011 1430   ALBUMIN 3.9 02/23/2014 1524   ALBUMIN 4.3 09/29/2011 1430   AST 19 02/23/2014 1524   AST 25 09/29/2011 1430   ALT 14 02/23/2014 1524   ALT 21 09/29/2011 1430   ALKPHOS 126 02/23/2014 1524   ALKPHOS 101 09/29/2011 1430   BILITOT 0.31 02/23/2014 1524   BILITOT 0.3 09/29/2011 1430   GFRNONAA 77* 12/24/2012 0520   GFRAA 89* 12/24/2012 0520    I No results found for: SPEP  Lab Results  Component Value Date   WBC 5.2 02/23/2014   NEUTROABS 2.9 02/23/2014   HGB 13.0 02/23/2014   HCT 39.8 02/23/2014   MCV 88.7 02/23/2014   PLT 147 02/23/2014      Chemistry      Component Value Date/Time   NA 145 02/23/2014 1524   NA 139 12/24/2012 0520   K 3.9 02/23/2014 1524   K 3.7 12/24/2012 0520   CL 105 12/24/2012 0520   CL 102 01/16/2012 0804   CO2 29 02/23/2014 1524   CO2 28 12/24/2012 0520   BUN 14.7 02/23/2014 1524   BUN 10 12/24/2012 0520   CREATININE 0.8 02/23/2014 1524   CREATININE 0.90 12/24/2012 0520      Component Value Date/Time   CALCIUM 9.7 02/23/2014 1524   CALCIUM 8.5 12/24/2012 0520   ALKPHOS 126 02/23/2014 1524   ALKPHOS 101 09/29/2011 1430   AST 19 02/23/2014 1524   AST 25 09/29/2011 1430   ALT 14 02/23/2014 1524   ALT 21 09/29/2011 1430   BILITOT 0.31 02/23/2014 1524   BILITOT 0.3 09/29/2011 1430       Lab Results  Component Value Date   LABCA2 24 05/31/2011    No components found for: [id number]  No results for  input(s): INR in the last 168 hours.  Urinalysis No results found for: COLORURINE  STUDIES: No results found.   ASSESSMENT: 43  y.o. BRCA negative [place] woman status post right breast upper outer quadrant and right axillary lymph node biopsy 05/18/2011, both positive for an invasive ductal carcinoma, high-grade, clinicallyT2 N1-2 or stage IIB/IIIA,  estrogen receptor 100% positive, progesterone receptor 87% positive, with an MIB-1 of 14% and no HER-2 amplification ([id number]).  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   (3)Treated neoadjuvantly with cyclophosphamide and docetaxel x4 completed 08/29/2011.  (4) status post right modified radical mastectomy 10/03/2011 showing a residual pT1c pN1a (3/18 lymph nodes positive) invasive ductal carcinoma, grade 1,estrogen receptor 100% positive, progesterone receptor negative, with no HER-2 amplification ([id number] [id number])  (5) adjuvant radiation to the right chest wall, right supraclavicular fossa and right scar completed 12/26/2011  (6) tamoxifen started January 2014  (7) status post total hysterectomy with bilateral salpingo-oophorectomy 12/23/2012 with benign pathology ([id number])  (7) anterolateral mastectomy pending, to be followed by reconstruction  PLAN: Sriya is doing generally well with the tamoxifen except for the problem with hot flashes. She is very sensitive to gabapentin and it does make her sleepy during the day. Accordingly we are stopping the daytime gabapentin. She will take 100 mg at bedtime, which is effective at helping her sleep and helping her not have nighttime hot flashes.  For the daytime hot  flashes we will start venlafaxine, 37.5 mg. She has a good understanding of the possible toxicities, side effects and complications of this agent. Usually at this dose there are no significant side effects. However many patients do not have a significant response at this very low dose. She will let us know after 3 or 4 weeks if things are not better and if so we will increase the dose to 75 mg.  She is very comfortable with tamoxifen and we will continue that for another year. After that we will consider switching to an aromatase inhibitor depending on bone density results.   Xavia  has a good understanding of the overall plan. She agrees with it. She knows the goal of treatment in her case is cure. She will call with any problems that may develop before her next visit here.  Carolyn Cruel, MD   02/23/2014 3:58 PM

## 2014-02-24 NOTE — Addendum Note (Signed)
Addended by: Laureen Abrahams on: 02/24/2014 03:51 PM   Modules accepted: Orders, Medications

## 2014-02-28 ENCOUNTER — Encounter (HOSPITAL_COMMUNITY): Payer: Self-pay | Admitting: Emergency Medicine

## 2014-02-28 ENCOUNTER — Observation Stay (HOSPITAL_COMMUNITY)
Admission: EM | Admit: 2014-02-28 | Discharge: 2014-03-02 | Disposition: A | Discharge status: Home / Self Care | Payer: BC Managed Care – PPO | Attending: General Surgery | Admitting: General Surgery

## 2014-02-28 DIAGNOSIS — Z9010 Acquired absence of unspecified breast and nipple: Secondary | ICD-10-CM | POA: Insufficient documentation

## 2014-02-28 DIAGNOSIS — Z853 Personal history of malignant neoplasm of breast: Secondary | ICD-10-CM | POA: Insufficient documentation

## 2014-02-28 DIAGNOSIS — K829 Disease of gallbladder, unspecified: Secondary | ICD-10-CM

## 2014-02-28 DIAGNOSIS — Z79899 Other long term (current) drug therapy: Secondary | ICD-10-CM | POA: Diagnosis not present

## 2014-02-28 DIAGNOSIS — Z79810 Long term (current) use of selective estrogen receptor modulators (SERMs): Secondary | ICD-10-CM | POA: Diagnosis not present

## 2014-02-28 DIAGNOSIS — K8010 Calculus of gallbladder with chronic cholecystitis without obstruction: Secondary | ICD-10-CM | POA: Diagnosis not present

## 2014-02-28 DIAGNOSIS — R1110 Vomiting, unspecified: Secondary | ICD-10-CM | POA: Diagnosis present

## 2014-02-28 DIAGNOSIS — K8050 Calculus of bile duct without cholangitis or cholecystitis without obstruction: Secondary | ICD-10-CM | POA: Diagnosis present

## 2014-02-28 LAB — COMPREHENSIVE METABOLIC PANEL
ALT: 22 U/L (ref 0–35)
AST: 29 U/L (ref 0–37)
Albumin: 4.3 g/dL (ref 3.5–5.2)
Alkaline Phosphatase: 117 U/L (ref 39–117)
Anion gap: 10 (ref 5–15)
BUN: 11 mg/dL (ref 6–23)
CALCIUM: 9.6 mg/dL (ref 8.4–10.5)
CHLORIDE: 104 mmol/L (ref 96–112)
CO2: 26 mmol/L (ref 19–32)
CREATININE: 0.79 mg/dL (ref 0.50–1.10)
GLUCOSE: 96 mg/dL (ref 70–99)
POTASSIUM: 3.8 mmol/L (ref 3.5–5.1)
Sodium: 140 mmol/L (ref 135–145)
Total Bilirubin: 0.7 mg/dL (ref 0.3–1.2)
Total Protein: 7.5 g/dL (ref 6.0–8.3)

## 2014-02-28 LAB — CBC WITH DIFFERENTIAL/PLATELET
BASOS ABS: 0 10*3/uL (ref 0.0–0.1)
Basophils Relative: 0 % (ref 0–1)
EOS PCT: 0 % (ref 0–5)
Eosinophils Absolute: 0 10*3/uL (ref 0.0–0.7)
HCT: 38.4 % (ref 36.0–46.0)
HEMOGLOBIN: 13 g/dL (ref 12.0–15.0)
LYMPHS PCT: 37 % (ref 12–46)
Lymphs Abs: 1.3 10*3/uL (ref 0.7–4.0)
MCH: 29.7 pg (ref 26.0–34.0)
MCHC: 33.9 g/dL (ref 30.0–36.0)
MCV: 87.9 fL (ref 78.0–100.0)
Monocytes Absolute: 0.2 10*3/uL (ref 0.1–1.0)
Monocytes Relative: 5 % (ref 3–12)
Neutro Abs: 2.1 10*3/uL (ref 1.7–7.7)
Neutrophils Relative %: 58 % (ref 43–77)
PLATELETS: 152 10*3/uL (ref 150–400)
RBC: 4.37 MIL/uL (ref 3.87–5.11)
RDW: 11.5 % (ref 11.5–15.5)
WBC: 3.6 10*3/uL — ABNORMAL LOW (ref 4.0–10.5)

## 2014-02-28 LAB — LIPASE, BLOOD: Lipase: 22 U/L (ref 11–59)

## 2014-02-28 MED ORDER — ONDANSETRON HCL 4 MG/2ML IJ SOLN
4.0000 mg | Freq: Four times a day (QID) | INTRAMUSCULAR | Status: DC | PRN
  Administered 2014-02-28 – 2014-03-01 (×2): 4 mg via INTRAVENOUS
  Filled 2014-02-28 (×2): qty 2

## 2014-02-28 MED ORDER — CHLORHEXIDINE GLUCONATE 4 % EX LIQD
1.0000 "application " | Freq: Once | CUTANEOUS | Status: AC
  Administered 2014-02-28: 1 via TOPICAL
  Filled 2014-02-28: qty 15

## 2014-02-28 MED ORDER — ACETAMINOPHEN 650 MG RE SUPP: 650.0000 mg | Freq: Four times a day (QID) | RECTAL | Status: DC | PRN

## 2014-02-28 MED ORDER — SODIUM CHLORIDE 0.9 % IV BOLUS (SEPSIS)
2000.0000 mL | Freq: Once | INTRAVENOUS | Status: AC
Start: 2014-02-28 — End: 2014-02-28
  Administered 2014-02-28: 2000 mL via INTRAVENOUS

## 2014-02-28 MED ORDER — DIPHENHYDRAMINE HCL 12.5 MG/5ML PO ELIX: 12.5000 mg | ORAL_SOLUTION | Freq: Four times a day (QID) | ORAL | Status: DC | PRN

## 2014-02-28 MED ORDER — GABAPENTIN 100 MG PO CAPS
100.0000 mg | ORAL_CAPSULE | Freq: Three times a day (TID) | ORAL | Status: DC
  Administered 2014-02-28 – 2014-03-02 (×3): 100 mg via ORAL
  Filled 2014-02-28 (×7): qty 1

## 2014-02-28 MED ORDER — TAMOXIFEN CITRATE 10 MG PO TABS
20.0000 mg | ORAL_TABLET | Freq: Every day | ORAL | Status: DC
  Administered 2014-02-28 – 2014-03-02 (×2): 20 mg via ORAL
  Filled 2014-02-28 (×4): qty 2

## 2014-02-28 MED ORDER — DEXTROSE 5 % IV SOLN
2.0000 g | INTRAVENOUS | Status: AC
  Administered 2014-03-01: 2 g via INTRAVENOUS
  Filled 2014-02-28 (×2): qty 2

## 2014-02-28 MED ORDER — DIPHENHYDRAMINE HCL 50 MG/ML IJ SOLN: 12.5000 mg | Freq: Four times a day (QID) | INTRAMUSCULAR | Status: DC | PRN

## 2014-02-28 MED ORDER — MORPHINE SULFATE 2 MG/ML IJ SOLN
2.0000 mg | INTRAMUSCULAR | Status: DC | PRN
  Administered 2014-03-01: 2 mg via INTRAVENOUS
  Filled 2014-02-28: qty 1

## 2014-02-28 MED ORDER — CHLORHEXIDINE GLUCONATE 4 % EX LIQD
1.0000 "application " | Freq: Once | CUTANEOUS | Status: AC
  Administered 2014-03-01: 1 via TOPICAL
  Filled 2014-02-28: qty 15

## 2014-02-28 MED ORDER — METOCLOPRAMIDE HCL 5 MG/ML IJ SOLN
10.0000 mg | Freq: Once | INTRAMUSCULAR | Status: AC
  Administered 2014-02-28: 10 mg via INTRAMUSCULAR
  Filled 2014-02-28: qty 2

## 2014-02-28 MED ORDER — ENOXAPARIN SODIUM 40 MG/0.4ML ~~LOC~~ SOLN
40.0000 mg | SUBCUTANEOUS | Status: DC
  Administered 2014-02-28 – 2014-03-01 (×2): 40 mg via SUBCUTANEOUS
  Filled 2014-02-28 (×3): qty 0.4

## 2014-02-28 MED ORDER — ACETAMINOPHEN 325 MG PO TABS: 650.0000 mg | ORAL_TABLET | Freq: Four times a day (QID) | ORAL | Status: DC | PRN

## 2014-02-28 MED ORDER — VENLAFAXINE HCL ER 37.5 MG PO CP24
37.5000 mg | ORAL_CAPSULE | Freq: Every day | ORAL | Status: DC
  Administered 2014-03-01 – 2014-03-02 (×2): 37.5 mg via ORAL
  Filled 2014-02-28 (×3): qty 1

## 2014-02-28 MED ORDER — KCL IN DEXTROSE-NACL 20-5-0.45 MEQ/L-%-% IV SOLN
INTRAVENOUS | Status: DC
  Administered 2014-02-28 – 2014-03-02 (×3): via INTRAVENOUS
  Filled 2014-02-28 (×3): qty 1000

## 2014-02-28 MED ORDER — SODIUM CHLORIDE 0.9 % IV SOLN: 1.0000 mg/h | INTRAVENOUS | Status: DC

## 2014-02-28 MED ORDER — DOCUSATE SODIUM 100 MG PO CAPS
100.0000 mg | ORAL_CAPSULE | Freq: Two times a day (BID) | ORAL | Status: DC
  Administered 2014-02-28 – 2014-03-02 (×3): 100 mg via ORAL
  Filled 2014-02-28 (×5): qty 1

## 2014-02-28 MED ORDER — ONDANSETRON HCL 4 MG/2ML IJ SOLN
4.0000 mg | Freq: Once | INTRAMUSCULAR | Status: AC
  Administered 2014-02-28: 4 mg via INTRAVENOUS
  Filled 2014-02-28: qty 2

## 2014-02-28 MED ORDER — HYDROMORPHONE HCL 1 MG/ML IJ SOLN
1.0000 mg | Freq: Once | INTRAMUSCULAR | Status: AC
  Administered 2014-02-28: 1 mg via INTRAVENOUS
  Filled 2014-02-28: qty 1

## 2014-02-28 MED ORDER — ALVIMOPAN 12 MG PO CAPS
12.0000 mg | ORAL_CAPSULE | Freq: Once | ORAL | Status: AC
Start: 2014-02-28 — End: 2014-03-01
  Administered 2014-03-01: 12 mg via ORAL
  Filled 2014-02-28: qty 1

## 2014-02-28 NOTE — ED Provider Notes (Signed)
CSN: [id number]     Arrival date & time 02/28/14  1704 History   First MD Initiated Contact with Patient 02/28/14 1817     Chief Complaint  Patient presents with  . Emesis  . Abdominal Pain     (Consider location/radiation/quality/duration/timing/severity/associated sxs/prior Treatment) HPI Complains of right upper quadrant pain nonradiating onset 5 days ago. She reports temperature 101 last night. She claims of nausea presently pain resolved immediately prior to my exam. Presently complains of severe nausea. No other associated symptoms. Last bowel movement yesterday, normal.. No urinary symptoms. No other associated symptoms. Patient had abdominal ultrasound 02/24/14% complaint noted to have cholelithiasis. She admits to diminished appetite. Has not been able to eat since yesterday. No other associated symptoms. Past Medical History  Diagnosis Date  . 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 Hx of radiation therapy 11/08/11 -12/26/11    right chest wall/supraclav fossa, right scar  . History of chemotherapy 06/27/11 -09/04/11    neoadjuvant   Past Surgical History  Procedure Laterality Date  . Wisdom tooth extraction    . Portacath placement  06/20/2011    Procedure: INSERTION PORT-A-CATH;  Surgeon: Haywood Lasso, MD;  Location: [facility];  Service: General;  Laterality: N/A;  . Modified mastectomy  10/03/2011    Procedure: MODIFIED MASTECTOMY;  Surgeon: Haywood Lasso, MD;  Location: [facility];  Service: General;  Laterality: Right;  . Port-a-cath removal  01/30/2012    Procedure: MINOR REMOVAL PORT-A-CATH;  Surgeon: Haywood Lasso, MD;  Location: [facility];  Service: General;  Laterality: Left;  . Breast biopsy      Left  . Robotic assisted total hysterectomy with bilateral salpingo oopherectomy Bilateral 12/23/2012    Procedure: ROBOTIC ASSISTED TOTAL HYSTERECTOMY WITH BILATERAL SALPINGO  OOPHORECTOMY;  Surgeon: Marvene Staff, MD;  Location: [facility];  Service: Gynecology;  Laterality: Bilateral;   Family History  Problem Relation Age of Onset  . Breast cancer Maternal Aunt 3  . Pancreatic cancer Maternal Grandfather   . Pancreatic cancer Maternal Aunt     died in her 65s  . Leukemia Maternal Aunt     died in her 62s  . Ovarian cancer Cousin 76    maternal cousin   History  Substance Use Topics  . Smoking status: Never Smoker   . Smokeless tobacco: Never Used  . Alcohol Use: No   OB History    No data available     Review of Systems  Constitutional: Positive for fever.  Gastrointestinal: Positive for nausea and abdominal pain.  All other systems reviewed and are negative.     Allergies  Allegra and Shellfish allergy  Home Medications   Prior to Admission medications   Medication Sig Start Date End Date Taking? Authorizing Provider  Cholecalciferol (VITAMIN D3) 2000 UNITS TABS Take 2,000 Units by mouth daily.   Yes Historical Provider, MD  gabapentin (NEURONTIN) 100 MG capsule Take 100 mg by mouth 3 (three) times daily.   Yes Historical Provider, MD  Multiple Vitamin (MULTIVITAMIN) tablet Take 1 tablet by mouth daily.   Yes Historical Provider, MD  tamoxifen (NOLVADEX) 20 MG tablet Take 1 tablet (20 mg total) by mouth daily. 02/23/14  Yes Chauncey Cruel, MD  venlafaxine XR (EFFEXOR-XR) 37.5 MG 24 hr capsule Take 1 capsule (37.5 mg total) by mouth daily with breakfast. 02/23/14  Yes Chauncey Cruel, MD   BP 126/73 mmHg  Pulse 97  Temp(Src) 97.5 F (36.4 C) (Oral)  Resp 20  SpO2 99% Physical Exam  Constitutional: She appears well-developed and well-nourished. No distress.  HENT:  Head: Normocephalic and atraumatic.  Eyes: Conjunctivae are normal. Pupils are equal, round, and reactive to light.  Neck: Neck supple. No tracheal deviation present. No thyromegaly present.  Cardiovascular: Normal rate and regular rhythm.   No murmur  heard. Pulmonary/Chest: Effort normal and breath sounds normal.  Abdominal: Soft. Bowel sounds are normal. She exhibits no distension. There is tenderness.  Tender at right upper quadrant  Musculoskeletal: Normal range of motion. She exhibits no edema or tenderness.  Neurological: She is alert. Coordination normal.  Skin: Skin is warm and dry. No rash noted.  Psychiatric: She has a normal mood and affect.  Nursing note and vitals reviewed.   ED Course  Procedures (including critical care time) Labs Review Labs Reviewed  CBC WITH DIFFERENTIAL/PLATELET - Abnormal; Notable for the following:    WBC 3.6 (*)    All other components within normal limits  COMPREHENSIVE METABOLIC PANEL  LIPASE, BLOOD    Imaging Review No results found.   EKG Interpretation None     At 1855 PM she complained of abdominal pain again. Hydromorphone ordered. At 1930 p.m. patient resting comfortably. Nausea much improved. Pain much improved. Results for orders placed or performed during the hospital encounter of 02/28/14  CBC with Differential  Result Value Ref Range   WBC 3.6 (L) 4.0 - 10.5 K/uL   RBC 4.37 3.87 - 5.11 MIL/uL   Hemoglobin 13.0 12.0 - 15.0 g/dL   HCT 38.4 36.0 - 46.0 %   MCV 87.9 78.0 - 100.0 fL   MCH 29.7 26.0 - 34.0 pg   MCHC 33.9 30.0 - 36.0 g/dL   RDW 11.5 11.5 - 15.5 %   Platelets 152 150 - 400 K/uL   Neutrophils Relative % 58 43 - 77 %   Neutro Abs 2.1 1.7 - 7.7 K/uL   Lymphocytes Relative 37 12 - 46 %   Lymphs Abs 1.3 0.7 - 4.0 K/uL   Monocytes Relative 5 3 - 12 %   Monocytes Absolute 0.2 0.1 - 1.0 K/uL   Eosinophils Relative 0 0 - 5 %   Eosinophils Absolute 0.0 0.0 - 0.7 K/uL   Basophils Relative 0 0 - 1 %   Basophils Absolute 0.0 0.0 - 0.1 K/uL  Comprehensive metabolic panel  Result Value Ref Range   Sodium 140 135 - 145 mmol/L   Potassium 3.8 3.5 - 5.1 mmol/L   Chloride 104 96 - 112 mmol/L   CO2 26 19 - 32 mmol/L   Glucose, Bld 96 70 - 99 mg/dL   BUN 11 6 - 23  mg/dL   Creatinine, Ser 0.79 0.50 - 1.10 mg/dL   Calcium 9.6 8.4 - 10.5 mg/dL   Total Protein 7.5 6.0 - 8.3 g/dL   Albumin 4.3 3.5 - 5.2 g/dL   AST 29 0 - 37 U/L   ALT 22 0 - 35 U/L   Alkaline Phosphatase 117 39 - 117 U/L   Total Bilirubin 0.7 0.3 - 1.2 mg/dL   GFR calc non Af Amer >90 >90 mL/min   GFR calc Af Amer >90 >90 mL/min   Anion gap 10 5 - 15  Lipase, blood  Result Value Ref Range   Lipase 22 11 - 59 U/L   No results found.  MDM  Case has with Dr. Marcello Moores, surgeon who will evaluate patient  for admission and surgery Diagnosis biliary colic  Final diagnoses:  None        Orlie Dakin, MD 02/28/14 1945

## 2014-02-28 NOTE — ED Notes (Signed)
Pt from home c/o nausea, abd pain and dx of gallstones 5 days ago at [facility]. Pt sts that she has not made appt to see surgeon because she thought she could wait, but now pain and emesis is worse. Pt c/o RUQ pain that radiates to R lower back. Pt is A&O and in NAD

## 2014-02-28 NOTE — H&P (Signed)
Carolyn [name] is an 44 y.o. female.   Chief Complaint: nausea, RUQ pain  HPI: Patient presents with 5 days RUQ pain, nausea that worsens after eating.  She has not been able to tolerate a solid diet in several days and has been tolerating less liquids over the past couple days.  The pain is located in her RUQ and mid epigastric area.  It does not radiate.  She reports fevers at home.  Past Medical History  Diagnosis Date  . 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 Hx of radiation therapy 11/08/11 -12/26/11    right chest wall/supraclav fossa, right scar  . History of chemotherapy 06/27/11 -09/04/11    neoadjuvant    Past Surgical History  Procedure Laterality Date  . Wisdom tooth extraction    . Portacath placement  06/20/2011    Procedure: INSERTION PORT-A-CATH;  Surgeon: Haywood Lasso, MD;  Location: [facility];  Service: General;  Laterality: N/A;  . Modified mastectomy  10/03/2011    Procedure: MODIFIED MASTECTOMY;  Surgeon: Haywood Lasso, MD;  [place] [facility];  Service: General;  Laterality: Right;  . Port-a-cath removal  01/30/2012    Procedure: MINOR REMOVAL PORT-A-CATH;  Surgeon: Haywood Lasso, MD;  Location: [facility];  Service: General;  Laterality: Left;  . Breast biopsy      Left  . Robotic assisted total hysterectomy with bilateral salpingo oopherectomy Bilateral 12/23/2012    Procedure: ROBOTIC ASSISTED TOTAL HYSTERECTOMY WITH BILATERAL SALPINGO OOPHORECTOMY;  Surgeon: Marvene Staff, MD;  Location: [facility];  Service: Gynecology;  Laterality: Bilateral;    Family History  Problem Relation Age of Onset  . Breast cancer Maternal Aunt 8  . Pancreatic cancer Maternal Grandfather   . Pancreatic cancer Maternal Aunt     died in her 74s  . Leukemia Maternal Aunt     died in her 52s  . Ovarian cancer Cousin 7    maternal cousin   Social History:  reports that she has never  smoked. She has never used smokeless tobacco. She reports that she does not drink alcohol or use illicit drugs.  Allergies:  Allergies  Allergen Reactions  . Allegra [Fexofenadine] Hives    Abdomen only  . Shellfish Allergy Hives    Abdomen only     (Not in a hospital admission)  Results for orders placed or performed during the hospital encounter of 02/28/14 (from the past 48 hour(s))  CBC with Differential     Status: Abnormal   Collection Time: 02/28/14  5:56 PM  Result Value Ref Range   WBC 3.6 (L) 4.0 - 10.5 K/uL   RBC 4.37 3.87 - 5.11 MIL/uL   Hemoglobin 13.0 12.0 - 15.0 g/dL   HCT 38.4 36.0 - 46.0 %   MCV 87.9 78.0 - 100.0 fL   MCH 29.7 26.0 - 34.0 pg   MCHC 33.9 30.0 - 36.0 g/dL   RDW 11.5 11.5 - 15.5 %   Platelets 152 150 - 400 K/uL   Neutrophils Relative % 58 43 - 77 %   Neutro Abs 2.1 1.7 - 7.7 K/uL   Lymphocytes Relative 37 12 - 46 %   Lymphs Abs 1.3 0.7 - 4.0 K/uL   Monocytes Relative 5 3 - 12 %   Monocytes Absolute 0.2 0.1 - 1.0 K/uL   Eosinophils Relative 0 0 - 5 %   Eosinophils Absolute 0.0 0.0 - 0.7  K/uL   Basophils Relative 0 0 - 1 %   Basophils Absolute 0.0 0.0 - 0.1 K/uL  Comprehensive metabolic panel     Status: None   Collection Time: 02/28/14  5:56 PM  Result Value Ref Range   Sodium 140 135 - 145 mmol/L   Potassium 3.8 3.5 - 5.1 mmol/L   Chloride 104 96 - 112 mmol/L   CO2 26 19 - 32 mmol/L   Glucose, Bld 96 70 - 99 mg/dL   BUN 11 6 - 23 mg/dL   Creatinine, Ser 0.79 0.50 - 1.10 mg/dL   Calcium 9.6 8.4 - 10.5 mg/dL   Total Protein 7.5 6.0 - 8.3 g/dL   Albumin 4.3 3.5 - 5.2 g/dL   AST 29 0 - 37 U/L   ALT 22 0 - 35 U/L   Alkaline Phosphatase 117 39 - 117 U/L   Total Bilirubin 0.7 0.3 - 1.2 mg/dL   GFR calc non Af Amer >90 >90 mL/min   GFR calc Af Amer >90 >90 mL/min    Comment: (NOTE) The eGFR has been calculated using the CKD EPI equation. This calculation has not been validated in all clinical situations. eGFR's persistently <90 mL/min  signify possible Chronic Kidney Disease.    Anion gap 10 5 - 15  Lipase, blood     Status: None   Collection Time: 02/28/14  5:56 PM  Result Value Ref Range   Lipase 22 11 - 59 U/L   No results found.  Review of Systems  Constitutional: Positive for fever and malaise/fatigue. Negative for chills and weight loss.  HENT: Negative for congestion.   Eyes: Negative for blurred vision and double vision.  Respiratory: Negative for cough and shortness of breath.   Cardiovascular: Negative for chest pain and palpitations.  Gastrointestinal: Positive for heartburn, nausea, abdominal pain and constipation. Negative for diarrhea and blood in stool.  Genitourinary: Negative for dysuria, urgency and frequency.  Musculoskeletal: Negative for myalgias and joint pain.  Skin: Negative for itching and rash.  Neurological: Negative for dizziness, focal weakness and headaches.    Blood pressure 98/63, pulse 82, temperature 97.5 F (36.4 C), temperature source Oral, resp. rate 16, height '5\' 8"'  (1.727 m), weight 135 lb (61.236 kg), SpO2 99 %. Physical Exam  Constitutional: She is oriented to person, place, and time. She appears well-developed and well-nourished. No distress.  HENT:  Head: Normocephalic and atraumatic.  Eyes: Conjunctivae and EOM are normal. Pupils are equal, round, and reactive to light.  Neck: Normal range of motion. Neck supple.  Cardiovascular: Normal rate and regular rhythm.   Respiratory: Effort normal and breath sounds normal. No respiratory distress.  GI: Soft. She exhibits no distension. There is tenderness.  +RUQ and midepigastric tenderness  Musculoskeletal: Normal range of motion. She exhibits no tenderness.  Neurological: She is alert and oriented to person, place, and time.  Skin: Skin is warm and dry.     Assessment/Plan Pt appears to have biliary colic and is unable to tolerate PO.  We discussed cholecystectomy.  She will be admitted for planned procedure in AM.     The anatomy & physiology of hepatobiliary & pancreatic function was discussed.  The pathophysiology of gallbladder dysfunction was discussed.  Natural history risks without surgery was discussed.   I feel the risks of no intervention will lead to serious problems that outweigh the operative risks; therefore, I recommended cholecystectomy to remove the pathology.  I explained laparoscopic techniques with possible need for an open  approach.  Probable cholangiogram to evaluate the bilary tract was explained as well.    Risks such as bleeding, infection, abscess, leak, injury to other organs, need for further treatment, heart attack, death, and other risks were discussed.  I noted a good likelihood this will help address the problem.  Possibility that this will not correct all abdominal symptoms was explained.  Goals of post-operative recovery were discussed as well.  We will work to minimize complications.  An educational handout further explaining the pathology and treatment options was given as well.  Questions were answered.  The patient expresses understanding & wishes to proceed with surgery.   Robertson Colclough C. 09/11/4095, 3:53 PM

## 2014-03-01 ENCOUNTER — Observation Stay (HOSPITAL_COMMUNITY): Payer: BC Managed Care – PPO | Admitting: Registered Nurse

## 2014-03-01 ENCOUNTER — Encounter (HOSPITAL_COMMUNITY)
Admission: EM | Disposition: A | Discharge status: Home / Self Care | Payer: Self-pay | Source: Home / Self Care | Attending: Emergency Medicine

## 2014-03-01 ENCOUNTER — Encounter (HOSPITAL_COMMUNITY): Payer: Self-pay | Admitting: Registered Nurse

## 2014-03-01 HISTORY — PX: CHOLECYSTECTOMY: SHX55

## 2014-03-01 LAB — SURGICAL PCR SCREEN
MRSA, PCR: NEGATIVE
STAPHYLOCOCCUS AUREUS: NEGATIVE

## 2014-03-01 SURGERY — LAPAROSCOPIC CHOLECYSTECTOMY
Anesthesia: General | Site: Abdomen

## 2014-03-01 MED ORDER — DEXTROSE 5 % IV SOLN
INTRAVENOUS | Status: AC
  Filled 2014-03-01: qty 2

## 2014-03-01 MED ORDER — METOCLOPRAMIDE HCL 5 MG/ML IJ SOLN
INTRAMUSCULAR | Status: DC | PRN
  Administered 2014-03-01: 10 mg via INTRAVENOUS

## 2014-03-01 MED ORDER — 0.9 % SODIUM CHLORIDE (POUR BTL) OPTIME
TOPICAL | Status: DC | PRN
Start: 2014-03-01 — End: 2014-03-01
  Administered 2014-03-01: 1000 mL

## 2014-03-01 MED ORDER — PROPOFOL 10 MG/ML IV BOLUS
INTRAVENOUS | Status: AC
Start: 2014-03-01 — End: 2014-03-01
  Filled 2014-03-01: qty 20

## 2014-03-01 MED ORDER — MEPERIDINE HCL 25 MG/ML IJ SOLN: 6.2500 mg | INTRAMUSCULAR | Status: DC | PRN

## 2014-03-01 MED ORDER — NEOSTIGMINE METHYLSULFATE 10 MG/10ML IV SOLN
INTRAVENOUS | Status: DC | PRN
  Administered 2014-03-01: 2 mg via INTRAVENOUS

## 2014-03-01 MED ORDER — PROPOFOL INFUSION 10 MG/ML OPTIME
INTRAVENOUS | Status: DC | PRN
  Administered 2014-03-01: 200 ug/kg/min via INTRAVENOUS

## 2014-03-01 MED ORDER — LACTATED RINGERS IV SOLN
INTRAVENOUS | Status: DC | PRN
  Administered 2014-03-01 (×2): via INTRAVENOUS

## 2014-03-01 MED ORDER — SUCCINYLCHOLINE CHLORIDE 20 MG/ML IJ SOLN
INTRAMUSCULAR | Status: DC | PRN
  Administered 2014-03-01: 100 mg via INTRAVENOUS

## 2014-03-01 MED ORDER — LACTATED RINGERS IR SOLN
Status: DC | PRN
  Administered 2014-03-01: 1

## 2014-03-01 MED ORDER — FENTANYL CITRATE 0.05 MG/ML IJ SOLN
INTRAMUSCULAR | Status: DC | PRN
  Administered 2014-03-01: 100 ug via INTRAVENOUS
  Administered 2014-03-01 (×2): 50 ug via INTRAVENOUS

## 2014-03-01 MED ORDER — LIDOCAINE HCL (CARDIAC) 20 MG/ML IV SOLN
INTRAVENOUS | Status: AC
  Filled 2014-03-01: qty 5

## 2014-03-01 MED ORDER — ROCURONIUM BROMIDE 100 MG/10ML IV SOLN
INTRAVENOUS | Status: DC | PRN
  Administered 2014-03-01: 5 mg via INTRAVENOUS

## 2014-03-01 MED ORDER — BUPIVACAINE-EPINEPHRINE 0.25% -1:200000 IJ SOLN
INTRAMUSCULAR | Status: DC | PRN
  Administered 2014-03-01: 10 mL

## 2014-03-01 MED ORDER — KCL IN DEXTROSE-NACL 20-5-0.45 MEQ/L-%-% IV SOLN
INTRAVENOUS | Status: AC
  Filled 2014-03-01: qty 1000

## 2014-03-01 MED ORDER — BUPIVACAINE-EPINEPHRINE (PF) 0.5% -1:200000 IJ SOLN
INTRAMUSCULAR | Status: AC
  Filled 2014-03-01: qty 30

## 2014-03-01 MED ORDER — DEXAMETHASONE SODIUM PHOSPHATE 10 MG/ML IJ SOLN
INTRAMUSCULAR | Status: DC | PRN
  Administered 2014-03-01: 10 mg via INTRAVENOUS

## 2014-03-01 MED ORDER — HYDROMORPHONE HCL 1 MG/ML IJ SOLN
INTRAMUSCULAR | Status: AC
  Filled 2014-03-01: qty 1

## 2014-03-01 MED ORDER — HYDROCODONE-ACETAMINOPHEN 5-325 MG PO TABS
1.0000 | ORAL_TABLET | ORAL | Status: DC | PRN
  Administered 2014-03-01: 1 via ORAL
  Filled 2014-03-01 (×2): qty 1

## 2014-03-01 MED ORDER — GLYCOPYRROLATE 0.2 MG/ML IJ SOLN
INTRAMUSCULAR | Status: AC
  Filled 2014-03-01: qty 3

## 2014-03-01 MED ORDER — METOCLOPRAMIDE HCL 5 MG/ML IJ SOLN
INTRAMUSCULAR | Status: AC
  Filled 2014-03-01: qty 2

## 2014-03-01 MED ORDER — MIDAZOLAM HCL 2 MG/2ML IJ SOLN
INTRAMUSCULAR | Status: AC
  Filled 2014-03-01: qty 2

## 2014-03-01 MED ORDER — MIDAZOLAM HCL 5 MG/5ML IJ SOLN
INTRAMUSCULAR | Status: DC | PRN
  Administered 2014-03-01: 2 mg via INTRAVENOUS

## 2014-03-01 MED ORDER — ONDANSETRON HCL 4 MG/2ML IJ SOLN
INTRAMUSCULAR | Status: AC
Start: 2014-03-01 — End: 2014-03-01
  Filled 2014-03-01: qty 2

## 2014-03-01 MED ORDER — LIDOCAINE HCL (CARDIAC) 20 MG/ML IV SOLN
INTRAVENOUS | Status: DC | PRN
  Administered 2014-03-01: 75 mg via INTRAVENOUS

## 2014-03-01 MED ORDER — SCOPOLAMINE 1 MG/3DAYS TD PT72
MEDICATED_PATCH | TRANSDERMAL | Status: AC
  Filled 2014-03-01: qty 1

## 2014-03-01 MED ORDER — DEXAMETHASONE SODIUM PHOSPHATE 10 MG/ML IJ SOLN
INTRAMUSCULAR | Status: AC
  Filled 2014-03-01: qty 1

## 2014-03-01 MED ORDER — HYDROMORPHONE HCL 1 MG/ML IJ SOLN
0.2500 mg | INTRAMUSCULAR | Status: DC | PRN
  Administered 2014-03-01 (×3): 0.5 mg via INTRAVENOUS

## 2014-03-01 MED ORDER — NEOSTIGMINE METHYLSULFATE 10 MG/10ML IV SOLN
INTRAVENOUS | Status: AC
  Filled 2014-03-01: qty 1

## 2014-03-01 MED ORDER — METOCLOPRAMIDE HCL 5 MG/ML IJ SOLN: 10.0000 mg | Freq: Once | INTRAMUSCULAR | Status: DC | PRN

## 2014-03-01 MED ORDER — ACETAMINOPHEN 10 MG/ML IV SOLN
1000.0000 mg | Freq: Once | INTRAVENOUS | Status: DC
  Filled 2014-03-01: qty 100

## 2014-03-01 MED ORDER — GLYCOPYRROLATE 0.2 MG/ML IJ SOLN
INTRAMUSCULAR | Status: DC | PRN
  Administered 2014-03-01: 0.4 mg via INTRAVENOUS

## 2014-03-01 MED ORDER — PROPOFOL 10 MG/ML IV BOLUS
INTRAVENOUS | Status: DC | PRN
  Administered 2014-03-01: 170 mg via INTRAVENOUS

## 2014-03-01 MED ORDER — PROPOFOL 10 MG/ML IV BOLUS
INTRAVENOUS | Status: AC
  Filled 2014-03-01: qty 20

## 2014-03-01 MED ORDER — ACETAMINOPHEN 10 MG/ML IV SOLN
INTRAVENOUS | Status: DC | PRN
  Administered 2014-03-01: 1000 mg via INTRAVENOUS

## 2014-03-01 MED ORDER — FENTANYL CITRATE 0.05 MG/ML IJ SOLN
INTRAMUSCULAR | Status: AC
  Filled 2014-03-01: qty 5

## 2014-03-01 MED ORDER — ONDANSETRON HCL 4 MG/2ML IJ SOLN
INTRAMUSCULAR | Status: DC | PRN
  Administered 2014-03-01: 4 mg via INTRAVENOUS

## 2014-03-01 MED ORDER — ROCURONIUM BROMIDE 100 MG/10ML IV SOLN
INTRAVENOUS | Status: AC
  Filled 2014-03-01: qty 1

## 2014-03-01 MED ORDER — SCOPOLAMINE 1 MG/3DAYS TD PT72
MEDICATED_PATCH | TRANSDERMAL | Status: DC | PRN
  Administered 2014-03-01: 1 via TRANSDERMAL

## 2014-03-01 SURGICAL SUPPLY — 29 items
APPLIER CLIP 5 13 M/L LIGAMAX5 (MISCELLANEOUS) ×4
CABLE HIGH FREQUENCY MONO STRZ (ELECTRODE) ×4 IMPLANT
CHLORAPREP W/TINT 26ML (MISCELLANEOUS) ×4 IMPLANT
CLIP APPLIE 5 13 M/L LIGAMAX5 (MISCELLANEOUS) ×2 IMPLANT
COVER MAYO STAND STRL (DRAPES) ×4 IMPLANT
DRAPE C-ARM 42X120 X-RAY (DRAPES) ×4 IMPLANT
DRAPE LAPAROSCOPIC ABDOMINAL (DRAPES) ×4 IMPLANT
ELECT REM PT RETURN 9FT ADLT (ELECTROSURGICAL) ×4
ELECTRODE REM PT RTRN 9FT ADLT (ELECTROSURGICAL) ×2 IMPLANT
GLOVE BIO SURGEON STRL SZ 6.5 (GLOVE) ×3 IMPLANT
GLOVE BIO SURGEONS STRL SZ 6.5 (GLOVE) ×1
GLOVE BIOGEL PI IND STRL 7.0 (GLOVE) ×2 IMPLANT
GLOVE BIOGEL PI INDICATOR 7.0 (GLOVE) ×2
GOWN L4 XXLG W/PAP TWL (GOWN DISPOSABLE) ×4 IMPLANT
KIT BASIN OR (CUSTOM PROCEDURE TRAY) ×4 IMPLANT
LIQUID BAND (GAUZE/BANDAGES/DRESSINGS) ×4 IMPLANT
POUCH SPECIMEN RETRIEVAL 10MM (ENDOMECHANICALS) ×4 IMPLANT
SCISSORS LAP 5X35 DISP (ENDOMECHANICALS) ×4 IMPLANT
SET CHOLANGIOGRAPH MIX (MISCELLANEOUS) ×4 IMPLANT
SET IRRIG TUBING LAPAROSCOPIC (IRRIGATION / IRRIGATOR) ×4 IMPLANT
SUT VIC AB 2-0 SH 27 (SUTURE) ×2
SUT VIC AB 2-0 SH 27X BRD (SUTURE) ×2 IMPLANT
SUT VIC AB 4-0 PS2 18 (SUTURE) ×4 IMPLANT
TOWEL OR 17X26 10 PK STRL BLUE (TOWEL DISPOSABLE) ×4 IMPLANT
TOWEL OR NON WOVEN STRL DISP B (DISPOSABLE) ×4 IMPLANT
TRAY LAPAROSCOPIC (CUSTOM PROCEDURE TRAY) ×4 IMPLANT
TROCAR BLADELESS OPT 5 75 (ENDOMECHANICALS) ×4 IMPLANT
TROCAR SLEEVE XCEL 5X75 (ENDOMECHANICALS) ×8 IMPLANT
TROCAR XCEL BLUNT TIP 100MML (ENDOMECHANICALS) ×4 IMPLANT

## 2014-03-01 NOTE — Anesthesia Preprocedure Evaluation (Addendum)
Anesthesia Evaluation  Patient identified by MRN, date of birth, ID band Patient awake    Reviewed: Allergy & Precautions, NPO status , Patient's Chart, lab work & pertinent test results  History of Anesthesia Complications (+) PONV and history of anesthetic complications  Airway Mallampati: II  TM Distance: >3 FB Neck ROM: Full    Dental no notable dental hx. (+) Teeth Intact   Pulmonary neg pulmonary ROS,  breath sounds clear to auscultation  Pulmonary exam normal       Cardiovascular negative cardio ROS  Rhythm:Regular Rate:Normal     Neuro/Psych Depression negative neurological ROS     GI/Hepatic Neg liver ROS, Biliary colic Cholecystitis   Endo/Other  Hx/o Right breast Ca S/P mastectomy w/ AND S/P ChemoRx and RT  Renal/GU negative Renal ROS  negative genitourinary   Musculoskeletal negative musculoskeletal ROS (+)   Abdominal (+)  Abdomen: soft and tender.    Peds  Hematology negative hematology ROS (+)   Anesthesia Other Findings   Reproductive/Obstetrics negative OB ROS                            Anesthesia Physical Anesthesia Plan  ASA: II  Anesthesia Plan: General   Post-op Pain Management:    Induction: Intravenous, Rapid sequence and Cricoid pressure planned  Airway Management Planned: Oral ETT  Additional Equipment:   Intra-op Plan:   Post-operative Plan: Extubation in OR  Informed Consent: I have reviewed the patients History and Physical, chart, labs and discussed the procedure including the risks, benefits and alternatives for the proposed anesthesia with the patient or authorized representative who has indicated his/her understanding and acceptance.   Dental advisory given  Plan Discussed with: CRNA, Anesthesiologist and Surgeon  Anesthesia Plan Comments:         Anesthesia Quick Evaluation

## 2014-03-01 NOTE — Progress Notes (Signed)
Pt arrived to unit room 1506 via stretcher. VS taken pt oriented to room and callbell with no complications. Gait unsteady, general weakness. Pain 3/10. Initial assessment completed. Will continue to monitor throughout shift.

## 2014-03-01 NOTE — Progress Notes (Signed)
UR completed 

## 2014-03-01 NOTE — Interval H&P Note (Signed)
History and Physical Interval Note:  03/01/2014 8:11 AM  Carolyn [name]  has presented today for surgery, with the diagnosis of cholecystitis  The various methods of treatment have been discussed with the patient and family. After consideration of risks, benefits and other options for treatment, the patient has consented to  Procedure(s): LAPAROSCOPIC CHOLECYSTECTOMY WITH INTRAOPERATIVE CHOLANGIOGRAM (N/A) as a surgical intervention .  The patient's history has been reviewed, patient examined, no change in status, stable for surgery.  I have reviewed the patient's chart and labs.  Questions were answered to the patient's satisfaction.     Rosario Adie, MD  [facility]

## 2014-03-01 NOTE — Transfer of Care (Signed)
Immediate Anesthesia Transfer of Care Note  Patient: Carolyn [name]  Procedure(s) Performed: Procedure(s): LAPAROSCOPIC CHOLECYSTECTOMY (N/A)  Patient Location: PACU  Anesthesia Type:General  Level of Consciousness: awake, alert , oriented and patient cooperative  Airway & Oxygen Therapy: Patient Spontanous Breathing and Patient connected to face mask oxygen  Post-op Assessment: Report given to RN, Post -op Vital signs reviewed and stable and Patient moving all extremities X 4  Post vital signs: stable  Last Vitals:  Filed Vitals:   03/01/14 0401  BP: 109/69  Pulse: 77  Temp: 36.8 C  Resp: 16    Complications: No apparent anesthesia complications

## 2014-03-01 NOTE — Op Note (Signed)
02/28/2014 - 03/01/2014  9:12 AM  PATIENT:  Carolyn [name]  44 y.o. female  Patient Care Team: [name], MD as PCP - General (Family Medicine) Thea Silversmith, MD (Radiation Oncology) Haywood Lasso, MD as Surgeon (General Surgery) Chauncey Cruel, MD as Consulting Physician (Oncology)  PRE-OPERATIVE DIAGNOSIS:  Symptomatic cholelithiasis   POST-OPERATIVE DIAGNOSIS:  Symptomatic cholelithiasis   PROCEDURE:  LAPAROSCOPIC CHOLECYSTECTOMY  Surgeon(s): Leighton Ruff, MD Armandina Gemma, MD  ASSISTANT: Harlow Asa   ANESTHESIA:   local and general  EBL:   min  DRAINS: none   SPECIMEN:  Source of Specimen:  galbladder  DISPOSITION OF SPECIMEN:  PATHOLOGY  COUNTS:  YES  PLAN OF CARE: Admit for overnight observation  PATIENT DISPOSITION:  PACU - hemodynamically stable.  INDICATION: 44 y.o. F with 5 days of nausea and RUQ pain.  US shows cholelithiasis  The anatomy & physiology of hepatobiliary & pancreatic function was discussed.  The pathophysiology of gallbladder dysfunction was discussed.  Natural history risks without surgery was discussed.   I feel the risks of no intervention will lead to serious problems that outweigh the operative risks; therefore, I recommended cholecystectomy to remove the pathology.  I explained laparoscopic techniques with possible need for an open approach.  Probable cholangiogram to evaluate the bilary tract was explained as well.    Risks such as bleeding, infection, abscess, leak, injury to other organs, need for further treatment, heart attack, death, and other risks were discussed.  I noted a good likelihood this will help address the problem.  Possibility that this will not correct all abdominal symptoms was explained.  Goals of post-operative recovery were discussed as well.    OR FINDINGS: evidence of chronic inflammation at cystic triangle  DESCRIPTION:   The patient was identified & brought into the operating room. The patient was  positioned supine with arms tucked. SCDs were active during the entire case. The patient underwent general anesthesia without any difficulty.  The abdomen was prepped and draped in a sterile fashion. A Surgical Timeout was performed and confirmed our plan.  We positioned the patient in reverse [place] & right side up.  I placed a Hassan laparoscopic port through the umbilicus using open entry technique.  Entry was clean. There were no adhesions to the anterior abdominal wall supraumbilically.  We induced carbon dioxide insufflation. Camera inspection revealed no injury.    I proceeded to continue with laparoscopic technique. I placed a 5 mm port in mid subcostal region, another 7mm port in the right flank near the anterior axillary line, and a 21mm port in the left subxiphoid region obliquely within the falciform ligament.  I turned attention to the right upper quadrant.  There were no inflammatory adhesions and the duodenum appeared normal.  The gallbladder fundus was elevated cephalad. I used cautery and blunt dissection to free the peritoneal coverings between the gallbladder and the liver on the posteriolateral and anteriomedial walls.   I used careful blunt and cautery dissection with a maryland dissector to help get a good critical view of the cystic artery and cystic duct. I did further dissection to free a few centimeters of the  gallbladder off the liver bed to get a good critical view of the infundibulum and cystic duct. I mobilized the cystic artery.  I skeletonized the cystic duct.  After getting a good 360 view, I decided to not perform a cholangiogram.  I placed a clip on the infundibulum.   I placed clips on the  cystic duct x3.  I completed cystic duct transection with scissors.   I placed clips on the cystic artery x3 with 2 proximally.  I ligated the cystic artery using scissors. I freed the gallbladder from its remaining attachments to the liver. I ensured hemostasis on the gallbladder  fossa of the liver and elsewhere. I inspected the rest of the abdomen & detected no injury nor bleeding elsewhere.  I irrigated the RUQ with normal saline.  There was good hemostasis.  I removed the gallbladder through the umbilical port site.  I closed the umbilical fascia using 0 Vicryl stitches.   I closed the skin using 4-0 vicryl stitch.  Sterile dressings were applied. The patient was extubated & arrived in the PACU in stable condition.  I had discussed postoperative care with the patient in the holding area.   I will discuss  operative findings and postoperative goals / instructions with the patient's family.  Instructions are written in the chart as well.

## 2014-03-01 NOTE — Anesthesia Postprocedure Evaluation (Signed)
  Anesthesia Post-op Note  Patient: Carolyn [name]  Procedure(s) Performed: Procedure(s): LAPAROSCOPIC CHOLECYSTECTOMY (N/A)  Patient Location: PACU  Anesthesia Type:General  Level of Consciousness: awake, alert  and oriented  Airway and Oxygen Therapy: Patient Spontanous Breathing  Post-op Pain: mild  Post-op Assessment: Post-op Vital signs reviewed, Patient's Cardiovascular Status Stable, Respiratory Function Stable, Patent Airway, No signs of Nausea or vomiting and Pain level controlled  Post-op Vital Signs: Reviewed and stable  Last Vitals:  Filed Vitals:   03/01/14 1015  BP: 120/66  Pulse: 59  Temp:   Resp: 12    Complications: No apparent anesthesia complications

## 2014-03-02 ENCOUNTER — Encounter (HOSPITAL_COMMUNITY): Payer: Self-pay | Admitting: General Surgery

## 2014-03-02 ENCOUNTER — Encounter: Payer: Self-pay | Admitting: General Surgery

## 2014-03-02 MED ORDER — HYDROCODONE-ACETAMINOPHEN 5-325 MG PO TABS: 1.0000 | ORAL_TABLET | ORAL | Status: DC | PRN

## 2014-03-02 MED ORDER — IBUPROFEN 200 MG PO TABS: 600.0000 mg | ORAL_TABLET | Freq: Four times a day (QID) | ORAL | Status: DC | PRN

## 2014-03-02 MED ORDER — ACETAMINOPHEN 325 MG PO TABS: 650.0000 mg | ORAL_TABLET | Freq: Four times a day (QID) | ORAL | Status: DC | PRN

## 2014-03-02 MED ORDER — IBUPROFEN 200 MG PO TABS: ORAL_TABLET | ORAL | Status: DC

## 2014-03-02 NOTE — Discharge Summary (Signed)
Physician Discharge Summary  Patient ID: FYNN ADEL MRN: [id number] DOB/AGE: 1971-01-05 44 y.o.  Admit date: 02/28/2014 Discharge date: 03/02/2014  Admission Diagnoses: Symptomatic cholelithiasis  Discharge Diagnoses:  Biliary colic with symptomatic cholelithiasis Hx of right breast cancer with mastectomy 2013, chemo and radiation therapy. Prior hysterectomy  Active Problems:   Biliary colic   PROCEDURES: S/p LAPAROSCOPIC CHOLECYSTECTOMY, Dr. Leighton Ruff, 6/73/41  Hospital Course:  Patient presents with 5 days RUQ pain, nausea that worsens after eating. She has not been able to tolerate a solid diet in several days and has been tolerating less liquids over the past couple days. The pain is located in her RUQ and mid epigastric area. It does not radiate. She reports fevers at home. Pt appears to have biliary colic and is unable to tolerate PO. she was admitted by Dr. Marcello Moores and they discussed cholecystectomy. She was admitted for planned procedure in AM.  On admit her labs were all normal. She underwent surgery the following day, and has had no post op issues.  She had eggs for breakfast the first post op morning and pain was controlled with PO analgesics.   She was discharged home that AM.  Condition on d/c:  Improved.  Disposition: 01-Home or Self Care     Medication List    TAKE these medications        acetaminophen 325 MG tablet  Commonly known as:  TYLENOL  Take 2 tablets (650 mg total) by mouth every 6 (six) hours as needed for mild pain, moderate pain, fever or headache.     gabapentin 100 MG capsule  Commonly known as:  NEURONTIN  Take 100 mg by mouth 3 (three) times daily.     HYDROcodone-acetaminophen 5-325 MG per tablet  Commonly known as:  NORCO/VICODIN  Take 1 tablet by mouth every 4 (four) hours as needed for moderate pain.     ibuprofen 200 MG tablet  Commonly known as:  ADVIL,MOTRIN  You can take 2-3 tablets every 6 hours safely for pain.      multivitamin tablet  Take 1 tablet by mouth daily.     tamoxifen 20 MG tablet  Commonly known as:  NOLVADEX  Take 1 tablet (20 mg total) by mouth daily.     venlafaxine XR 37.5 MG 24 hr capsule  Commonly known as:  EFFEXOR-XR  Take 1 capsule (37.5 mg total) by mouth daily with breakfast.     Vitamin D3 2000 UNITS Tabs  Take 2,000 Units by mouth daily.           Follow-up Information    Follow up with [facility] 03/24/2014.   Why:  You have an appointment in the [facility] (Doc of the week) clinic, at 2:15 PM, be there for check in at least 30 minutes early.   Contact information:   [place] [id number] [phone]      Follow up with [name], MD.   Specialty:  Family Medicine   Why:  Let your primary care doctor know you have had surgery.   Contact information:   [address] [place] [address] [phone]       Signed: Earnstine Regal 03/02/2014, 12:49 PM

## 2014-03-02 NOTE — Progress Notes (Signed)
Discharge instructions given to pt, verbalized understanding. Left the unit in stable condition. 

## 2014-03-02 NOTE — Progress Notes (Signed)
1 Day Post-Op  Subjective: She ate eggs for breakfast, ambulating and pain controlled with Vicodin.   She would like to go home this AM.  Objective: Vital signs in last 24 hours: Temp:  [98 F (36.7 C)-98.5 F (36.9 C)] 98.2 F (36.8 C) (02/22 0549) Pulse Rate:  [59-78] 73 (02/22 0549) Resp:  [12-20] 18 (02/22 0549) BP: (101-128)/(58-74) 103/68 mmHg (02/22 0549) SpO2:  [99 %-100 %] 100 % (02/22 0549) Last BM Date: 02/28/14 320 PO Soft diet Afebrile, VSS No labs Intake/Output from previous day: 02/21 0701 - 02/22 0700 In: 2560.8 [P.O.:320; I.V.:2240.8] Out: -  Intake/Output this shift:    General appearance: alert, cooperative and no distress Resp: clear to auscultation bilaterally GI: soft sore, + BS, sites all look fine.  Lab Results:   Recent Labs  02/28/14 1756  WBC 3.6*  HGB 13.0  HCT 38.4  PLT 152    BMET  Recent Labs  02/28/14 1756  NA 140  K 3.8  CL 104  CO2 26  GLUCOSE 96  BUN 11  CREATININE 0.79  CALCIUM 9.6   PT/INR No results for input(s): LABPROT, INR in the last 72 hours.   Recent Labs Lab 02/23/14 1524 02/28/14 1756  AST 19 29  ALT 14 22  ALKPHOS 126 117  BILITOT 0.31 0.7  PROT 7.3 7.5  ALBUMIN 3.9 4.3     Lipase     Component Value Date/Time   LIPASE 22 02/28/2014 1756     Studies/Results: No results found.  Medications: . docusate sodium  100 mg Oral BID  . enoxaparin (LOVENOX) injection  40 mg Subcutaneous Q24H  . gabapentin  100 mg Oral TID  . tamoxifen  20 mg Oral Daily  . venlafaxine XR  37.5 mg Oral Q breakfast    Assessment/Plan Symptomatic cholelithiasis  S/p LAPAROSCOPIC CHOLECYSTECTOMY, Dr. Leighton Ruff, 8/92/11. Hx of right breast cancer with mastectomy 2013, chemo and radiation therapy. Prior hysterectomy   Plan:  Home today. Follow up 03/24/14 ay 2:15 PM, [facility].      Carolyn [name] 03/02/2014

## 2014-03-02 NOTE — Discharge Instructions (Signed)
Laparoscopic Cholecystectomy, Care After °Refer to this sheet in the next few weeks. These instructions provide you with information on caring for yourself after your procedure. Your health care provider may also give you more specific instructions. Your treatment has been planned according to current medical practices, but problems sometimes occur. Call your health care provider if you have any problems or questions after your procedure. °WHAT TO EXPECT AFTER THE PROCEDURE °After your procedure, it is typical to have the following: °· Pain at your incision sites. You will be given pain medicines to control the pain. °· Mild nausea or vomiting. This should improve after the first 24 hours. °· Bloating and possibly shoulder pain from the gas used during the procedure. This will improve after the first 24 hours. °HOME CARE INSTRUCTIONS  °· Change bandages (dressings) as directed by your health care provider. °· Keep the wound dry and clean. You may wash the wound gently with soap and water. Gently blot or dab the area dry. °· Do not take baths or use swimming pools or hot tubs for 2 weeks or until your health care provider approves. °· Only take over-the-counter or prescription medicines as directed by your health care provider. °· Continue your normal diet as directed by your health care provider. °· Do not lift anything heavier than 10 pounds (4.5 kg) until your health care provider approves. °· Do not play contact sports for 1 week or until your health care provider approves. °SEEK MEDICAL CARE IF:  °· You have redness, swelling, or increasing pain in the wound. °· You notice yellowish-white fluid (pus) coming from the wound. °· You have drainage from the wound that lasts longer than 1 day. °· You notice a bad smell coming from the wound or dressing. °· Your surgical cuts (incisions) break open. °SEEK IMMEDIATE MEDICAL CARE IF:  °· You develop a rash. °· You have difficulty breathing. °· You have chest pain. °· You  have a fever. °· You have increasing pain in the shoulders (shoulder strap areas). °· You have dizzy episodes or faint while standing. °· You have severe abdominal pain. °· You feel sick to your stomach (nauseous) or throw up (vomit) and this lasts for more than 1 day. °Document Released: 12/26/2004 Document Revised: 10/16/2012 Document Reviewed: 08/07/2012 °ExitCare® Patient Information ©2015 [employer]. This information is not intended to replace advice given to you by your health care provider. Make sure you discuss any questions you have with your health care provider. ° °[facility] ______[facility] °LAPAROSCOPIC SURGERY: POST OP INSTRUCTIONS °Always review your discharge instruction sheet given to you by the facility where your surgery was performed. °IF YOU HAVE DISABILITY OR FAMILY LEAVE FORMS, YOU MUST BRING THEM TO THE OFFICE FOR PROCESSING.   °DO NOT GIVE THEM TO YOUR DOCTOR. ° °1. A prescription for pain medication may be given to you upon discharge.  Take your pain medication as prescribed, if needed.  If narcotic pain medicine is not needed, then you may take acetaminophen (Tylenol) or ibuprofen (Advil) as needed. °2. Take your usually prescribed medications unless otherwise directed. °3. If you need a refill on your pain medication, please contact your pharmacy.  They will contact our office to request authorization. Prescriptions will not be filled after 5pm or on week-ends. °4. You should follow a light diet the first few days after arrival home, such as soup and crackers, etc.  Be sure to include lots of fluids daily. °5. Most patients will experience some   swelling and bruising in the area of the incisions.  Ice packs will help.  Swelling and bruising can take several days to resolve.  °6. It is common to experience some constipation if taking pain medication after surgery.  Increasing fluid intake and taking a stool softener (such as Colace) will usually help or prevent this problem  from occurring.  A mild laxative (Milk of Magnesia or Miralax) should be taken according to package instructions if there are no bowel movements after 48 hours. °7. Unless discharge instructions indicate otherwise, you may remove your bandages 24-48 hours after surgery, and you may shower at that time.  You may have steri-strips (small skin tapes) in place directly over the incision.  These strips should be left on the skin for 7-10 days.  If your surgeon used skin glue on the incision, you may shower in 24 hours.  The glue will flake off over the next 2-3 weeks.  Any sutures or staples will be removed at the office during your follow-up visit. °8. ACTIVITIES:  You may resume regular (light) daily activities beginning the next day--such as daily self-care, walking, climbing stairs--gradually increasing activities as tolerated.  You may have sexual intercourse when it is comfortable.  Refrain from any heavy lifting or straining until approved by your doctor. °a. You may drive when you are no longer taking prescription pain medication, you can comfortably wear a seatbelt, and you can safely maneuver your car and apply brakes. °b. RETURN TO WORK:  __________________________________________________________ °9. You should see your doctor in the office for a follow-up appointment approximately 2-3 weeks after your surgery.  Make sure that you call for this appointment within a day or two after you arrive home to insure a convenient appointment time. °10. OTHER INSTRUCTIONS: __________________________________________________________________________________________________________________________ __________________________________________________________________________________________________________________________ °WHEN TO CALL YOUR DOCTOR: °1. Fever over 101.0 °2. Inability to urinate °3. Continued bleeding from incision. °4. Increased pain, redness, or drainage from the incision. °5. Increasing abdominal pain ° °The  clinic staff is available to answer your questions during regular business hours.  Please don’t hesitate to call and ask to speak to one of the nurses for clinical concerns.  If you have a medical emergency, go to the nearest emergency room or call 911.  A surgeon from [facility] is always on call at the hospital. °[address], [address], [place], [place]  [address] ? [address] [address], [place],    [address] °[phone] ? [phone] ? FAX [phone] °Web site: [email or web address] °

## 2014-03-20 ENCOUNTER — Telehealth: Payer: Self-pay | Admitting: *Deleted

## 2014-03-20 NOTE — Telephone Encounter (Signed)
Patient called to say she has stopped the Effexor XR that was prescribed for her hot flashes.  She said it caused her to have blurry vision.  She said the gabapentin is adequate and she would like to continue on that alone at this time.

## 2014-04-12 ENCOUNTER — Emergency Department (HOSPITAL_COMMUNITY): Payer: BC Managed Care – PPO

## 2014-04-12 ENCOUNTER — Inpatient Hospital Stay (HOSPITAL_COMMUNITY)
Admission: EM | Admit: 2014-04-12 | Discharge: 2014-04-20 | DRG: 477 | Disposition: A | Discharge status: Rehabilitation Facility | Payer: BC Managed Care – PPO | Attending: Neurological Surgery | Admitting: Neurological Surgery

## 2014-04-12 ENCOUNTER — Encounter (HOSPITAL_COMMUNITY): Payer: Self-pay | Admitting: *Deleted

## 2014-04-12 DIAGNOSIS — C50919 Malignant neoplasm of unspecified site of unspecified female breast: Secondary | ICD-10-CM | POA: Diagnosis present

## 2014-04-12 DIAGNOSIS — Z91013 Allergy to seafood: Secondary | ICD-10-CM | POA: Diagnosis not present

## 2014-04-12 DIAGNOSIS — Z923 Personal history of irradiation: Secondary | ICD-10-CM | POA: Diagnosis not present

## 2014-04-12 DIAGNOSIS — D62 Acute posthemorrhagic anemia: Secondary | ICD-10-CM | POA: Diagnosis not present

## 2014-04-12 DIAGNOSIS — C7951 Secondary malignant neoplasm of bone: Secondary | ICD-10-CM | POA: Diagnosis present

## 2014-04-12 DIAGNOSIS — C50411 Malignant neoplasm of upper-outer quadrant of right female breast: Secondary | ICD-10-CM | POA: Diagnosis not present

## 2014-04-12 DIAGNOSIS — Z9221 Personal history of antineoplastic chemotherapy: Secondary | ICD-10-CM | POA: Diagnosis not present

## 2014-04-12 DIAGNOSIS — Z8041 Family history of malignant neoplasm of ovary: Secondary | ICD-10-CM

## 2014-04-12 DIAGNOSIS — C50811 Malignant neoplasm of overlapping sites of right female breast: Secondary | ICD-10-CM | POA: Diagnosis not present

## 2014-04-12 DIAGNOSIS — Z800 Family history of malignant neoplasm of digestive organs: Secondary | ICD-10-CM

## 2014-04-12 DIAGNOSIS — Z888 Allergy status to other drugs, medicaments and biological substances status: Secondary | ICD-10-CM | POA: Diagnosis not present

## 2014-04-12 DIAGNOSIS — M8448XS Pathological fracture, other site, sequela: Secondary | ICD-10-CM | POA: Diagnosis not present

## 2014-04-12 DIAGNOSIS — Z803 Family history of malignant neoplasm of breast: Secondary | ICD-10-CM | POA: Diagnosis not present

## 2014-04-12 DIAGNOSIS — R269 Unspecified abnormalities of gait and mobility: Secondary | ICD-10-CM

## 2014-04-12 DIAGNOSIS — M8458XA Pathological fracture in neoplastic disease, other specified site, initial encounter for fracture: Secondary | ICD-10-CM | POA: Diagnosis present

## 2014-04-12 DIAGNOSIS — Z681 Body mass index (BMI) 19 or less, adult: Secondary | ICD-10-CM

## 2014-04-12 DIAGNOSIS — G936 Cerebral edema: Secondary | ICD-10-CM | POA: Diagnosis present

## 2014-04-12 DIAGNOSIS — G959 Disease of spinal cord, unspecified: Secondary | ICD-10-CM | POA: Diagnosis not present

## 2014-04-12 DIAGNOSIS — G9529 Other cord compression: Secondary | ICD-10-CM | POA: Diagnosis not present

## 2014-04-12 DIAGNOSIS — R200 Anesthesia of skin: Secondary | ICD-10-CM | POA: Diagnosis present

## 2014-04-12 DIAGNOSIS — Z806 Family history of leukemia: Secondary | ICD-10-CM

## 2014-04-12 DIAGNOSIS — R109 Unspecified abdominal pain: Secondary | ICD-10-CM

## 2014-04-12 DIAGNOSIS — M549 Dorsalgia, unspecified: Secondary | ICD-10-CM

## 2014-04-12 DIAGNOSIS — Z170 Estrogen receptor positive status [ER+]: Secondary | ICD-10-CM | POA: Diagnosis not present

## 2014-04-12 DIAGNOSIS — G9520 Unspecified cord compression: Secondary | ICD-10-CM | POA: Diagnosis present

## 2014-04-12 DIAGNOSIS — C7931 Secondary malignant neoplasm of brain: Secondary | ICD-10-CM | POA: Diagnosis present

## 2014-04-12 DIAGNOSIS — E440 Moderate protein-calorie malnutrition: Secondary | ICD-10-CM | POA: Diagnosis present

## 2014-04-12 DIAGNOSIS — M8448XA Pathological fracture, other site, initial encounter for fracture: Secondary | ICD-10-CM | POA: Diagnosis present

## 2014-04-12 DIAGNOSIS — R270 Ataxia, unspecified: Secondary | ICD-10-CM | POA: Diagnosis present

## 2014-04-12 DIAGNOSIS — Z419 Encounter for procedure for purposes other than remedying health state, unspecified: Secondary | ICD-10-CM

## 2014-04-12 DIAGNOSIS — Z90710 Acquired absence of both cervix and uterus: Secondary | ICD-10-CM

## 2014-04-12 DIAGNOSIS — C799 Secondary malignant neoplasm of unspecified site: Secondary | ICD-10-CM

## 2014-04-12 DIAGNOSIS — C50911 Malignant neoplasm of unspecified site of right female breast: Secondary | ICD-10-CM

## 2014-04-12 LAB — HEPATIC FUNCTION PANEL
ALT: 15 U/L (ref 0–35)
AST: 22 U/L (ref 0–37)
Albumin: 4.1 g/dL (ref 3.5–5.2)
Alkaline Phosphatase: 102 U/L (ref 39–117)
Bilirubin, Direct: <0.1 mg/dL (ref 0.0–0.5)
Total Bilirubin: 0.5 mg/dL (ref 0.3–1.2)
Total Protein: 7.2 g/dL (ref 6.0–8.3)

## 2014-04-12 LAB — CBC
HCT: 37.2 % (ref 36.0–46.0)
HEMOGLOBIN: 12.5 g/dL (ref 12.0–15.0)
MCH: 29.7 pg (ref 26.0–34.0)
MCHC: 33.6 g/dL (ref 30.0–36.0)
MCV: 88.4 fL (ref 78.0–100.0)
Platelets: 154 10*3/uL (ref 150–400)
RBC: 4.21 MIL/uL (ref 3.87–5.11)
RDW: 12.1 % (ref 11.5–15.5)
WBC: 4.4 10*3/uL (ref 4.0–10.5)

## 2014-04-12 LAB — BASIC METABOLIC PANEL
ANION GAP: 11 (ref 5–15)
BUN: 8 mg/dL (ref 6–23)
CO2: 26 mmol/L (ref 19–32)
CREATININE: 0.8 mg/dL (ref 0.50–1.10)
Calcium: 9.4 mg/dL (ref 8.4–10.5)
Chloride: 107 mmol/L (ref 96–112)
GFR calc Af Amer: >90 mL/min (ref >90)
GFR, EST NON AFRICAN AMERICAN: 88 mL/min — AB (ref >90)
GLUCOSE: 111 mg/dL — AB (ref 70–99)
Potassium: 3.5 mmol/L (ref 3.5–5.1)
Sodium: 144 mmol/L (ref 135–145)

## 2014-04-12 LAB — LIPASE, BLOOD: LIPASE: 20 U/L (ref 11–59)

## 2014-04-12 MED ORDER — MORPHINE SULFATE 4 MG/ML IJ SOLN: 4.0000 mg | Freq: Once | INTRAMUSCULAR | Status: DC

## 2014-04-12 MED ORDER — DEXAMETHASONE SODIUM PHOSPHATE 10 MG/ML IJ SOLN
20.0000 mg | Freq: Once | INTRAMUSCULAR | Status: AC
  Administered 2014-04-12: 20 mg via INTRAVENOUS
  Filled 2014-04-12: qty 2

## 2014-04-12 MED ORDER — ONDANSETRON HCL 4 MG/2ML IJ SOLN
4.0000 mg | Freq: Once | INTRAMUSCULAR | Status: AC
  Administered 2014-04-12: 4 mg via INTRAVENOUS
  Filled 2014-04-12: qty 2

## 2014-04-12 MED ORDER — MORPHINE SULFATE 4 MG/ML IJ SOLN
4.0000 mg | Freq: Once | INTRAMUSCULAR | Status: AC
  Administered 2014-04-12: 4 mg via INTRAVENOUS
  Filled 2014-04-12: qty 1

## 2014-04-12 MED ORDER — LORAZEPAM 2 MG/ML IJ SOLN: 1.0000 mg | Freq: Once | INTRAMUSCULAR | Status: DC

## 2014-04-12 MED ORDER — GADOBENATE DIMEGLUMINE 529 MG/ML IV SOLN
12.0000 mL | Freq: Once | INTRAVENOUS | Status: AC | PRN
  Administered 2014-04-12: 12 mL via INTRAVENOUS

## 2014-04-12 NOTE — ED Notes (Signed)
Bed: WA20 Expected date: 04/12/14 Expected time: 4:52 PM Means of arrival: Ambulance Comments: Flank pain

## 2014-04-12 NOTE — H&P (Signed)
Carolyn [name] is an 44 y.o. female.   Chief Complaint: Leg weakness and unsteadiness of gait HPI: Patient is a 44 year old individual who's had the onset of weakness in her legs and unsteadiness of gait in the past 24-48 hours. She notes some periumbilical numbness may be a bit longer. She came into the hospital today because she was having difficulty walking. 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 There is high-grade cord compression and the central canal is narrowed to 4 mm in the AP diameter at T8. She is now being admitted to [facility] and will be scheduled for surgical decompression of the thoracic spine with fixation and fusion from T6-T10 early tomorrow. High-dose Decadron is being started.  Past Medical History  Diagnosis Date  . 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 Hx of radiation therapy 11/08/11 -12/26/11    right chest wall/supraclav fossa, right scar  . History of chemotherapy 06/27/11 -09/04/11    neoadjuvant    Past Surgical History  Procedure Laterality Date  . Wisdom tooth extraction    . Portacath placement  06/20/2011    Procedure: INSERTION PORT-A-CATH;  Surgeon: Haywood Lasso, MD;  Location: [facility];  Service: General;  Laterality: N/A;  . Modified mastectomy  10/03/2011    Procedure: MODIFIED MASTECTOMY;  Surgeon: Haywood Lasso, MD;  Location: [facility];  Service: General;  Laterality: Right;  . Port-a-cath removal  01/30/2012    Procedure: MINOR REMOVAL PORT-A-CATH;  Surgeon: Haywood Lasso, MD;  Location: [facility];  Service: General;  Laterality: Left;  . Breast biopsy      Left  . Robotic assisted total hysterectomy with bilateral salpingo oopherectomy Bilateral 12/23/2012    Procedure: ROBOTIC ASSISTED TOTAL HYSTERECTOMY WITH BILATERAL SALPINGO  OOPHORECTOMY;  Surgeon: Marvene Staff, MD;  Location: [facility];  Service: Gynecology;  Laterality: Bilateral;  . Cholecystectomy N/A 03/01/2014    Procedure: LAPAROSCOPIC CHOLECYSTECTOMY;  Surgeon: Leighton Ruff, MD;  Location: [facility];  Service: General;  Laterality: N/A;    Family History  Problem Relation Age of Onset  . Breast cancer Maternal Aunt 41  . Pancreatic cancer Maternal Grandfather   . Pancreatic cancer Maternal Aunt     died in her 34s  . Leukemia Maternal Aunt     died in her 38s  . Ovarian cancer Cousin 19    maternal cousin   Social History:  reports that she has never smoked. She has never used smokeless tobacco. She reports that she does not drink alcohol or use illicit drugs.  Allergies:  Allergies  Allergen Reactions  . Allegra [Fexofenadine] Hives    Abdomen only  . Shellfish Allergy Hives    Abdomen only     (Not in a hospital admission)  Results for orders placed or performed during the hospital encounter of 04/12/14 (from the past 48 hour(s))  Basic metabolic panel     Status: Abnormal   Collection Time: 04/12/14  5:28 PM  Result Value Ref Range   Sodium 144 135 - 145 mmol/L   Potassium 3.5 3.5 - 5.1 mmol/L   Chloride 107 96 - 112 mmol/L   CO2 26 19 - 32 mmol/L   Glucose, Bld 111 (H) 70 - 99 mg/dL   BUN 8 6 - 23 mg/dL  Creatinine, Ser 0.80 0.50 - 1.10 mg/dL   Calcium 9.4 8.4 - 10.5 mg/dL   GFR calc non Af Amer 88 (L) >90 mL/min   GFR calc Af Amer >90 >90 mL/min    Comment: (NOTE) The eGFR has been calculated using the CKD EPI equation. This calculation has not been validated in all clinical situations. eGFR's persistently <90 mL/min signify possible Chronic Kidney Disease.    Anion gap 11 5 - 15  CBC     Status: None   Collection Time: 04/12/14  5:28 PM  Result Value Ref Range   WBC 4.4 4.0 - 10.5 K/uL   RBC 4.21 3.87 - 5.11 MIL/uL   Hemoglobin 12.5 12.0 - 15.0 g/dL   HCT 37.2 36.0 - 46.0 %   MCV 88.4 78.0 - 100.0 fL   MCH 29.7  26.0 - 34.0 pg   MCHC 33.6 30.0 - 36.0 g/dL   RDW 12.1 11.5 - 15.5 %   Platelets 154 150 - 400 K/uL  Hepatic function panel     Status: None   Collection Time: 04/12/14  5:28 PM  Result Value Ref Range   Total Protein 7.2 6.0 - 8.3 g/dL   Albumin 4.1 3.5 - 5.2 g/dL   AST 22 0 - 37 U/L   ALT 15 0 - 35 U/L   Alkaline Phosphatase 102 39 - 117 U/L   Total Bilirubin 0.5 0.3 - 1.2 mg/dL   Bilirubin, Direct <0.1 0.0 - 0.5 mg/dL   Indirect Bilirubin NOT CALCULATED 0.3 - 0.9 mg/dL  Lipase, blood     Status: None   Collection Time: 04/12/14  5:28 PM  Result Value Ref Range   Lipase 20 11 - 59 U/L   Mr Thoracic Spine W Wo Contrast  04/12/2014   ADDENDUM REPORT: 04/12/2014 21:27  ADDENDUM: Subsequently noted on the whole body localizer 3 plane screening series #3 is asymmetric edema in the LEFT frontoparietal lobe concerning for a brain metastasis. MRI brain without and with contrast is recommended for further evaluation. Findings discussed with ordering provider.   Electronically Signed   By: Rolla Flatten M.D.   On: 04/12/2014 21:27   04/12/2014   CLINICAL DATA:  History of breast cancer with radiation and chemotherapy in 2013. Lower extremity weakness and difficulty bearing weight with back pain beginning a few weeks ago. Initial encounter.  EXAM: MRI THORACIC AND LUMBAR SPINE WITHOUT AND WITH CONTRAST  TECHNIQUE: Multiplanar and multiecho pulse sequences of the thoracic and lumbar spine were obtained without and with intravenous contrast.  CONTRAST:  6m MULTIHANCE GADOBENATE DIMEGLUMINE 529 MG/ML IV SOLN  COMPARISON:  None.  FINDINGS: MR THORACIC SPINE FINDINGS  Numbering of the thoracic spine was accomplished by counting down from the odontoid. There was an initial problem with the surface coil which was replaced mid way through the exam.  Widespread osseous metastatic disease is present. There is a pathologic compression fracture at T8 with slight retropulsion. Significant tumor involvement of the T7  and T9 vertebrae as well. Loss of approximately 50% vertebral body height at T8 is noted. Posterior element involvement is prominent at T8. Epidural tumor, along with retropulsed pathologic bone, extends into the spinal canal at T8, greater on the RIGHT, with significant ventral mass effect on the cord. Canal AP diameter is severely narrowed approximately 5 mm. Slight abnormal cord signal suspected at the T8 level on STIR imaging. The exiting foraminal nerve roots at T7-8 and T8-9 bilaterally are significantly affected, worse on the RIGHT.  No other areas of epidural tumor, but foci of metastatic disease are seen elsewhere in the thoracic spine including T6-7 posterior elements and T12 vertebral body most notably.  MR LUMBAR SPINE FINDINGS  No pathologic compression fracture, but widespread osseous involvement throughout the lumbar spine. The largest tumor deposit is seen in the L5 vertebral body anteriorly. Minimal superior endplate depression at L1. No epidural tumor. Small foci of sacral involvement. Normal conus and cauda equina nerve roots. No paravertebral masses.  IMPRESSION: Pathologic compression fracture T8. Retropulsed bone and epidural tumor result in cord compression at this level. Significant tumor involvement at T7 and T9 without retropulsion.  Widespread osseous disease throughout the thoracic and lumbar spine, but no significant epidural tumor elsewhere or pathologic compression.  Findings discussed with ordering provider by myself at the time of interpretation.  Electronically Signed: By: Rolla Flatten M.D. On: 04/12/2014 20:57   Mr Lumbar Spine W Wo Contrast (assess For Abscess, Cord Compression)  04/12/2014   ADDENDUM REPORT: 04/12/2014 21:27  ADDENDUM: Subsequently noted on the whole body localizer 3 plane screening series #3 is asymmetric edema in the LEFT frontoparietal lobe concerning for a brain metastasis. MRI brain without and with contrast is recommended for further evaluation. Findings  discussed with ordering provider.   Electronically Signed   By: Rolla Flatten M.D.   On: 04/12/2014 21:27   04/12/2014   CLINICAL DATA:  History of breast cancer with radiation and chemotherapy in 2013. Lower extremity weakness and difficulty bearing weight with back pain beginning a few weeks ago. Initial encounter.  EXAM: MRI THORACIC AND LUMBAR SPINE WITHOUT AND WITH CONTRAST  TECHNIQUE: Multiplanar and multiecho pulse sequences of the thoracic and lumbar spine were obtained without and with intravenous contrast.  CONTRAST:  74m MULTIHANCE GADOBENATE DIMEGLUMINE 529 MG/ML IV SOLN  COMPARISON:  None.  FINDINGS: MR THORACIC SPINE FINDINGS  Numbering of the thoracic spine was accomplished by counting down from the odontoid. There was an initial problem with the surface coil which was replaced mid way through the exam.  Widespread osseous metastatic disease is present. There is a pathologic compression fracture at T8 with slight retropulsion. Significant tumor involvement of the T7 and T9 vertebrae as well. Loss of approximately 50% vertebral body height at T8 is noted. Posterior element involvement is prominent at T8. Epidural tumor, along with retropulsed pathologic bone, extends into the spinal canal at T8, greater on the RIGHT, with significant ventral mass effect on the cord. Canal AP diameter is severely narrowed approximately 5 mm. Slight abnormal cord signal suspected at the T8 level on STIR imaging. The exiting foraminal nerve roots at T7-8 and T8-9 bilaterally are significantly affected, worse on the RIGHT.  No other areas of epidural tumor, but foci of metastatic disease are seen elsewhere in the thoracic spine including T6-7 posterior elements and T12 vertebral body most notably.  MR LUMBAR SPINE FINDINGS  No pathologic compression fracture, but widespread osseous involvement throughout the lumbar spine. The largest tumor deposit is seen in the L5 vertebral body anteriorly. Minimal superior endplate  depression at L1. No epidural tumor. Small foci of sacral involvement. Normal conus and cauda equina nerve roots. No paravertebral masses.  IMPRESSION: Pathologic compression fracture T8. Retropulsed bone and epidural tumor result in cord compression at this level. Significant tumor involvement at T7 and T9 without retropulsion.  Widespread osseous disease throughout the thoracic and lumbar spine, but no significant epidural tumor elsewhere or pathologic compression.  Findings discussed with ordering provider by myself  at the time of interpretation.  Electronically Signed: By: Rolla Flatten M.D. On: 04/12/2014 20:57    Review of Systems  Eyes: Negative.   Respiratory: Negative.   Cardiovascular: Negative.   Gastrointestinal: Positive for nausea.       Recent cholecystectomy  Musculoskeletal:       Unsteadiness of gait  Neurological: Positive for weakness.       Periumbilical numbness  Psychiatric/Behavioral: Negative.     Blood pressure 108/66, pulse 78, temperature 97.8 F (36.6 C), temperature source Oral, resp. rate 16, SpO2 98 %. Physical Exam  Constitutional: She is oriented to person, place, and time. She appears well-developed and well-nourished.  HENT:  Head: Normocephalic and atraumatic.  Eyes: Conjunctivae and EOM are normal. Pupils are equal, round, and reactive to light.  Neck: Normal range of motion. Neck supple.  Cardiovascular: Normal rate and regular rhythm.   Respiratory: Effort normal and breath sounds normal.  GI: Bowel sounds are normal.  Musculoskeletal:  Back nontender to palpation  Neurological: She is alert and oriented to person, place, and time.  45 strength in iliopsoas quadricep tibialis anterior and gastrocs bilaterally. Spontaneous clonus noted in both ankles. Fasciculations noted in strength testing and quads bilaterally.  Sensation diminished in. Umbilical area and on the anterior thighs bilaterally. Distal lower extremity sensation intact  Skin: Skin  is warm and dry.  Psychiatric: She has a normal mood and affect. Her behavior is normal. Judgment and thought content normal.     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  Schedule decompression and fusion from T6-T10. Start high-dose Decadron tonight.  Bruin Bolger J 04/12/2014, 10:03 PM

## 2014-04-12 NOTE — ED Notes (Signed)
Report called to [facility] at [facility].  [facility] contacted for transport

## 2014-04-12 NOTE — ED Notes (Signed)
Patient transported to MRI 

## 2014-04-12 NOTE — ED Notes (Signed)
Per EMS, pt from home, reports L flank pain which she reports it has been going on for  A while.  Pt c/o numbness from her umbilicus all the way down to her feet.  Pt also reports nausea.

## 2014-04-12 NOTE — ED Notes (Signed)
MD at bedside.  Neurologist at bedside

## 2014-04-12 NOTE — ED Provider Notes (Addendum)
Patient seen and evaluated. Discussed extensively with Margarita Mail PA. Patient with progressive back pain. Symptoms started after her fibroid 21st cholecystectomy. Increasing pain over several weeks. For the last 2 weeks his head "numbness" from her umbilicus/T8 level. Over last 3 days had difficulty with gait, neck or legs are shaky and off balance and not supporting me. On exam she has visible fasciculations, is hyperreflexic with clonus, Babinski, broad-based gait, poor proprioception. All symptoms consistent with myelopathy. MRI recommended. Results called by radiology Dr. Beatris Ship. Shows T7, 8, 9 epidural tumor with compression fracture of T8 with anterior and retropulsed fragments and spinal cord compression. Care discussed between myself and Dr. Ellene Route neurosurgery. He recommends #1, IV Decadron, #2:  Transfer to [facility]. #3: He will see the patient in consult here in the emergency room at [facility] long.  Was discussed at length with the patient. She is kept nothing by mouth during her department stay. Findings discussed at length. We will notify her oncologist of the recurrence of her tumor. Arrangements are being made for transfer currently.  I did receive a call back from radiology concerning the possibility of brain metastasis on MRI imaging.  Pts oncologist has been consulted.  We will forward info regarding MRI results.  Tanna Furry, MD 04/12/14 [id number]  Tanna Furry, MD 04/13/14 [phone]

## 2014-04-12 NOTE — ED Provider Notes (Signed)
CSN: [id number]     Arrival date & time 04/12/14  1657 History   First MD Initiated Contact with Patient 04/12/14 1722     Chief Complaint  Patient presents with  . Flank Pain  . 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 She is status post mastectomy, and total hysterectomy in 2014. The patient also underwent a cholecystectomy on 03/01/2014. Patient states that since that time, she developed left flank pain which has progressively worsened. A few weeks ago the patient developed numbness at the umbilicus which radiates down the bilateral lower extremities to the knee. It is predominantly on the anterior surface of the legs. Over the past few days. The patient has developed fasciculations in her legs and also new-onset gait ataxia. She describes it as walking "like a toddler when it first started still walk." She denies unexplained weight loss, soaking night sweats, saddle anesthesia, loss of bowel or bladder control. Complains of bilateral upper quadrant abdominal pain and rib cage pain. She states it feels like "my ribs are being cracked when I take a deep breath."   Past Medical History  Diagnosis Date  . 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 Hx of radiation therapy 11/08/11 -12/26/11    right chest wall/supraclav fossa, right scar  . History of chemotherapy 06/27/11 -09/04/11    neoadjuvant   Past Surgical History  Procedure Laterality Date  . Wisdom tooth extraction    . Portacath placement  06/20/2011    Procedure: INSERTION PORT-A-CATH;  Surgeon: Haywood Lasso, MD;  Location: [facility];  Service: General;  Laterality: N/A;  . Modified mastectomy  10/03/2011    Procedure: MODIFIED MASTECTOMY;  Surgeon: Haywood Lasso, MD;  Location:  [facility];  Service: General;  Laterality: Right;  . Port-a-cath removal  01/30/2012    Procedure: MINOR REMOVAL PORT-A-CATH;  Surgeon: Haywood Lasso, MD;  Location: [facility];  Service: General;  Laterality: Left;  . Breast biopsy      Left  . Robotic assisted total hysterectomy with bilateral salpingo oopherectomy Bilateral 12/23/2012    Procedure: ROBOTIC ASSISTED TOTAL HYSTERECTOMY WITH BILATERAL SALPINGO OOPHORECTOMY;  Surgeon: Marvene Staff, MD;  Location: [facility];  Service: Gynecology;  Laterality: Bilateral;  . Cholecystectomy N/A 03/01/2014    Procedure: LAPAROSCOPIC CHOLECYSTECTOMY;  Surgeon: Leighton Ruff, MD;  Location: [facility];  Service: General;  Laterality: N/A;   Family History  Problem Relation Age of Onset  . Breast cancer Maternal Aunt 15  . Pancreatic cancer Maternal Grandfather   . Pancreatic cancer Maternal Aunt     died in her 62s  . Leukemia Maternal Aunt     died in her 23s  . Ovarian cancer Cousin 74    maternal cousin   History  Substance Use Topics  . Smoking status: Never Smoker   . Smokeless tobacco: Never Used  . Alcohol Use: No   OB History    No data available     Review of Systems  Ten systems reviewed and are negative for acute change, except as noted in the HPI.    Allergies  Allegra and Shellfish allergy  Home Medications   Prior to Admission medications   Medication Sig Start Date End Date Taking? Authorizing Provider  Cholecalciferol (VITAMIN  D3) 2000 UNITS TABS Take 2,000 Units by mouth daily.   Yes Historical Provider, MD  gabapentin (NEURONTIN) 100 MG capsule Take 100 mg by mouth 3 (three) times daily.   Yes Historical Provider, MD  methocarbamol (ROBAXIN) 500 MG tablet Take 500 mg by mouth every 6 (six) hours as needed for muscle spasms.   Yes Historical Provider, MD  Multiple Vitamin (MULTIVITAMIN) tablet Take 1 tablet by mouth daily.   Yes Historical Provider, MD  tamoxifen (NOLVADEX)  20 MG tablet Take 1 tablet (20 mg total) by mouth daily. 02/23/14  Yes Chauncey Cruel, MD  acetaminophen (TYLENOL) 325 MG tablet Take 2 tablets (650 mg total) by mouth every 6 (six) hours as needed for mild pain, moderate pain, fever or headache. Patient not taking: Reported on 04/12/2014 03/02/14   Earnstine Regal, PA-C  HYDROcodone-acetaminophen (NORCO/VICODIN) 5-325 MG per tablet Take 1 tablet by mouth every 4 (four) hours as needed for moderate pain. Patient not taking: Reported on 04/12/2014 03/02/14   Earnstine Regal, PA-C  ibuprofen (ADVIL,MOTRIN) 200 MG tablet You can take 2-3 tablets every 6 hours safely for pain. Patient not taking: Reported on 04/12/2014 03/02/14   Earnstine Regal, PA-C  venlafaxine XR (EFFEXOR-XR) 37.5 MG 24 hr capsule Take 1 capsule (37.5 mg total) by mouth daily with breakfast. Patient not taking: Reported on 04/12/2014 02/23/14   Chauncey Cruel, MD   BP 115/72 mmHg  Pulse 80  Temp(Src) 97.8 F (36.6 C) (Oral)  Resp 18  SpO2 98% Physical Exam  Constitutional: She is oriented to person, place, and time. She appears well-developed and well-nourished. No distress.  HENT:  Head: Normocephalic and atraumatic.  Eyes: Conjunctivae are normal. No scleral icterus.  Neck: Normal range of motion.  Cardiovascular: Normal rate, regular rhythm and normal heart sounds.  Exam reveals no gallop and no friction rub.   No murmur heard. Pulmonary/Chest: Effort normal and breath sounds normal. No respiratory distress.  Abdominal: Soft. Bowel sounds are normal. She exhibits no distension and no mass. There is no tenderness. There is no guarding.  Musculoskeletal:  Tender to palpation left rib cage on the posterior wall, tender to palpation in the left lumbar paraspinal muscles.  Neurological: She is alert and oriented to person, place, and time. Gait abnormal.  Reflex Scores:      Patellar reflexes are 4+ on the right side and 4+ on the left side. Cranial nerves II through XII  grossly intact. Cerebellar function intact with normal finger to nose. Abnormal sensation with dull and sharp from the mid calf up to the umbilicus on the anterior legs.  Sensation grossly intact elsewhere. She is hyperreflexic patellar tendons.  There is clonus and fasciculations in the bilateral lower extremities. Patient with an ataxic and unsteady gait. No foot drop, strength with plantar and dorsiflexion intact.  Skin: Skin is warm and dry. She is not diaphoretic.    ED Course  Procedures (including critical care time) Labs Review Labs Reviewed  BASIC METABOLIC PANEL  CBC    Imaging Review No results found.   EKG Interpretation None      MDM   Final diagnoses:  None    6:32 PM BP 115/72 mmHg  Pulse 80  Temp(Src) 97.8 F (36.6 C) (Oral)  Resp 18  SpO2 98% Patient with a concerning neurologic findings, abdominal pain after surgery. I have high suspicion for metastatic breast cancer. However, other cord compression syndrome such as disc herniations, transverse myelitis or multiple sclerosis. Patient will get  an MRI and CT abdomen.  8:53 PM Patient with significant cord compression causing myelopathy of T7, T8 and T9. It appears to be significant metastatic disease throughout the spine. Cord compression is appears to be caused by both with retropulsion of bone fragments as well as bone mass.  Patient seen in the ED by Dr. Ellene Route who [facility]. The patient has been informed of her diagnosis. All questions answered to the best of my ability. She was seen in shared visit with Dr. Jeneen Rinks.   Margarita Mail, PA-C 04/16/14 1949  Tanna Furry, MD 04/18/14 [phone]

## 2014-04-13 ENCOUNTER — Inpatient Hospital Stay (HOSPITAL_COMMUNITY): Payer: BC Managed Care – PPO

## 2014-04-13 ENCOUNTER — Inpatient Hospital Stay (HOSPITAL_COMMUNITY): Payer: BC Managed Care – PPO | Admitting: Anesthesiology

## 2014-04-13 ENCOUNTER — Encounter (HOSPITAL_COMMUNITY)
Admission: EM | Disposition: A | Discharge status: Rehabilitation Facility | Payer: Self-pay | Source: Home / Self Care | Attending: Neurological Surgery

## 2014-04-13 ENCOUNTER — Encounter (HOSPITAL_COMMUNITY): Payer: Self-pay | Admitting: Anesthesiology

## 2014-04-13 DIAGNOSIS — E440 Moderate protein-calorie malnutrition: Secondary | ICD-10-CM | POA: Insufficient documentation

## 2014-04-13 HISTORY — PX: LAMINECTOMY: SHX219

## 2014-04-13 LAB — SURGICAL PCR SCREEN
MRSA, PCR: NEGATIVE
Staphylococcus aureus: NEGATIVE

## 2014-04-13 LAB — ABO/RH: ABO/RH(D): B POS

## 2014-04-13 LAB — PREPARE RBC (CROSSMATCH)

## 2014-04-13 SURGERY — THORACIC LAMINECTOMY FOR TUMOR
Anesthesia: General | Site: Spine Thoracic

## 2014-04-13 SURGERY — Surgical Case
Anesthesia: *Unknown

## 2014-04-13 MED ORDER — NEOSTIGMINE METHYLSULFATE 10 MG/10ML IV SOLN
INTRAVENOUS | Status: DC | PRN
  Administered 2014-04-13: 4 mg via INTRAVENOUS

## 2014-04-13 MED ORDER — SODIUM CHLORIDE 0.9 % IJ SOLN
INTRAMUSCULAR | Status: AC
  Filled 2014-04-13: qty 10

## 2014-04-13 MED ORDER — MIDAZOLAM HCL 5 MG/5ML IJ SOLN
INTRAMUSCULAR | Status: DC | PRN
  Administered 2014-04-13: 2 mg via INTRAVENOUS

## 2014-04-13 MED ORDER — PHENYLEPHRINE HCL 10 MG/ML IJ SOLN
INTRAMUSCULAR | Status: DC | PRN
  Administered 2014-04-13: 80 ug via INTRAVENOUS
  Administered 2014-04-13 (×3): 40 ug via INTRAVENOUS
  Administered 2014-04-13: 80 ug via INTRAVENOUS

## 2014-04-13 MED ORDER — GLYCOPYRROLATE 0.2 MG/ML IJ SOLN
INTRAMUSCULAR | Status: AC
  Filled 2014-04-13: qty 3

## 2014-04-13 MED ORDER — ALBUMIN HUMAN 5 % IV SOLN
INTRAVENOUS | Status: DC | PRN
  Administered 2014-04-13 (×2): via INTRAVENOUS

## 2014-04-13 MED ORDER — FENTANYL CITRATE 0.05 MG/ML IJ SOLN
INTRAMUSCULAR | Status: AC
  Filled 2014-04-13: qty 5

## 2014-04-13 MED ORDER — PROMETHAZINE HCL 25 MG/ML IJ SOLN: 6.2500 mg | INTRAMUSCULAR | Status: DC | PRN

## 2014-04-13 MED ORDER — GLYCOPYRROLATE 0.2 MG/ML IJ SOLN
INTRAMUSCULAR | Status: DC | PRN
  Administered 2014-04-13: 0.6 mg via INTRAVENOUS

## 2014-04-13 MED ORDER — LACTATED RINGERS IV SOLN
INTRAVENOUS | Status: DC | PRN
  Administered 2014-04-13 (×3): via INTRAVENOUS

## 2014-04-13 MED ORDER — LIDOCAINE-EPINEPHRINE 1 %-1:100000 IJ SOLN
INTRAMUSCULAR | Status: DC | PRN
  Administered 2014-04-13: 5 mL

## 2014-04-13 MED ORDER — VENLAFAXINE HCL ER 37.5 MG PO CP24: 37.5000 mg | ORAL_CAPSULE | Freq: Every day | ORAL | Status: DC

## 2014-04-13 MED ORDER — 0.9 % SODIUM CHLORIDE (POUR BTL) OPTIME
TOPICAL | Status: DC | PRN
  Administered 2014-04-13: 1000 mL

## 2014-04-13 MED ORDER — THROMBIN 5000 UNITS EX SOLR
OROMUCOSAL | Status: DC | PRN
  Administered 2014-04-13 (×5): 5 mL via TOPICAL

## 2014-04-13 MED ORDER — DEXAMETHASONE SODIUM PHOSPHATE 10 MG/ML IJ SOLN
INTRAMUSCULAR | Status: DC | PRN
  Administered 2014-04-13: 10 mg via INTRAVENOUS

## 2014-04-13 MED ORDER — MORPHINE SULFATE 2 MG/ML IJ SOLN
2.0000 mg | INTRAMUSCULAR | Status: DC | PRN
  Administered 2014-04-13 – 2014-04-16 (×11): 2 mg via INTRAVENOUS
  Filled 2014-04-13 (×11): qty 1

## 2014-04-13 MED ORDER — BUPIVACAINE HCL (PF) 0.5 % IJ SOLN
INTRAMUSCULAR | Status: DC | PRN
  Administered 2014-04-13: 25 mL
  Administered 2014-04-13: 5 mL

## 2014-04-13 MED ORDER — HYDROCODONE-ACETAMINOPHEN 5-325 MG PO TABS
1.0000 | ORAL_TABLET | ORAL | Status: DC | PRN
  Administered 2014-04-13 (×2): 1 via ORAL
  Filled 2014-04-13 (×2): qty 1

## 2014-04-13 MED ORDER — ROCURONIUM BROMIDE 50 MG/5ML IV SOLN
INTRAVENOUS | Status: AC
  Filled 2014-04-13: qty 1

## 2014-04-13 MED ORDER — PHENYLEPHRINE HCL 10 MG/ML IJ SOLN
10.0000 mg | INTRAVENOUS | Status: DC | PRN
  Administered 2014-04-13: 10 ug/min via INTRAVENOUS

## 2014-04-13 MED ORDER — FENTANYL CITRATE 0.05 MG/ML IJ SOLN
INTRAMUSCULAR | Status: DC | PRN
  Administered 2014-04-13 (×5): 50 ug via INTRAVENOUS

## 2014-04-13 MED ORDER — CEFAZOLIN SODIUM-DEXTROSE 2-3 GM-% IV SOLR
INTRAVENOUS | Status: AC
  Filled 2014-04-13: qty 50

## 2014-04-13 MED ORDER — PROPOFOL 10 MG/ML IV BOLUS
INTRAVENOUS | Status: AC
  Filled 2014-04-13: qty 20

## 2014-04-13 MED ORDER — MIDAZOLAM HCL 2 MG/2ML IJ SOLN
INTRAMUSCULAR | Status: AC
  Filled 2014-04-13: qty 2

## 2014-04-13 MED ORDER — SUCCINYLCHOLINE CHLORIDE 20 MG/ML IJ SOLN
INTRAMUSCULAR | Status: AC
  Filled 2014-04-13: qty 1

## 2014-04-13 MED ORDER — CEFAZOLIN SODIUM-DEXTROSE 2-3 GM-% IV SOLR
INTRAVENOUS | Status: DC | PRN
  Administered 2014-04-13: 2 g via INTRAVENOUS

## 2014-04-13 MED ORDER — LIDOCAINE HCL (CARDIAC) 20 MG/ML IV SOLN
INTRAVENOUS | Status: DC | PRN
  Administered 2014-04-13: 100 mg via INTRAVENOUS

## 2014-04-13 MED ORDER — THROMBIN 20000 UNITS EX SOLR
CUTANEOUS | Status: DC | PRN
  Administered 2014-04-13 (×3): 20 mL via TOPICAL

## 2014-04-13 MED ORDER — SODIUM CHLORIDE 0.9 % IV SOLN
INTRAVENOUS | Status: DC
  Administered 2014-04-13 – 2014-04-14 (×4): via INTRAVENOUS

## 2014-04-13 MED ORDER — HYDROMORPHONE HCL 1 MG/ML IJ SOLN
0.2500 mg | INTRAMUSCULAR | Status: AC | PRN
  Administered 2014-04-13 – 2014-04-14 (×2): 0.5 mg via INTRAVENOUS
  Administered 2014-04-14 (×2): 0.25 mg via INTRAVENOUS
  Administered 2014-04-15 (×4): 0.5 mg via INTRAVENOUS
  Filled 2014-04-13 (×9): qty 1

## 2014-04-13 MED ORDER — NEOSTIGMINE METHYLSULFATE 10 MG/10ML IV SOLN
INTRAVENOUS | Status: AC
  Filled 2014-04-13: qty 1

## 2014-04-13 MED ORDER — ONDANSETRON HCL 4 MG/2ML IJ SOLN
INTRAMUSCULAR | Status: DC | PRN
  Administered 2014-04-13 (×2): 4 mg via INTRAVENOUS

## 2014-04-13 MED ORDER — SODIUM CHLORIDE 0.9 % IR SOLN
Status: DC | PRN
  Administered 2014-04-13: 500 mL

## 2014-04-13 MED ORDER — ROCURONIUM BROMIDE 100 MG/10ML IV SOLN
INTRAVENOUS | Status: DC | PRN
  Administered 2014-04-13 (×2): 10 mg via INTRAVENOUS
  Administered 2014-04-13: 50 mg via INTRAVENOUS

## 2014-04-13 MED ORDER — METOCLOPRAMIDE HCL 5 MG/ML IJ SOLN
INTRAMUSCULAR | Status: DC | PRN
  Administered 2014-04-13: 10 mg via INTRAVENOUS

## 2014-04-13 MED ORDER — EPHEDRINE SULFATE 50 MG/ML IJ SOLN
INTRAMUSCULAR | Status: AC
  Filled 2014-04-13: qty 1

## 2014-04-13 MED ORDER — SODIUM CHLORIDE 0.9 % IV SOLN: Freq: Once | INTRAVENOUS | Status: DC

## 2014-04-13 MED ORDER — PROPOFOL 10 MG/ML IV BOLUS
INTRAVENOUS | Status: DC | PRN
  Administered 2014-04-13: 150 mg via INTRAVENOUS
  Administered 2014-04-13: 20 mg via INTRAVENOUS

## 2014-04-13 SURGICAL SUPPLY — 86 items
BAG DECANTER FOR FLEXI CONT (MISCELLANEOUS) ×3 IMPLANT
BENZOIN TINCTURE PRP APPL 2/3 (GAUZE/BANDAGES/DRESSINGS) IMPLANT
BLADE CLIPPER SURG (BLADE) IMPLANT
BLADE SURG 11 STRL SS (BLADE) IMPLANT
BONE CANC CHIPS 20CC PCAN1/4 (Bone Implant) ×3 IMPLANT
BUR MATCHSTICK NEURO 3.0 LAGG (BURR) ×3 IMPLANT
CANISTER SUCT 3000ML PPV (MISCELLANEOUS) ×3 IMPLANT
CASPER BONE GRAFT SAW 8MM IMPLANT
CATH ROBINSON RED A/P 10FR (CATHETERS) IMPLANT
CHIPS CANC BONE 20CC PCAN1/4 (Bone Implant) ×1 IMPLANT
CLIP TI MEDIUM 6 (CLIP) IMPLANT
CLOSURE WOUND 1/2 X4 (GAUZE/BANDAGES/DRESSINGS)
CONNECTOR RELINE-O 35-45 5.5 (Connector) ×3 IMPLANT
CONT SPEC 4OZ CLIKSEAL STRL BL (MISCELLANEOUS) ×9 IMPLANT
DECANTER SPIKE VIAL GLASS SM (MISCELLANEOUS) ×3 IMPLANT
DERMABOND ADHESIVE PROPEN (GAUZE/BANDAGES/DRESSINGS) ×2
DERMABOND ADVANCED .7 DNX6 (GAUZE/BANDAGES/DRESSINGS) ×1 IMPLANT
DRAPE C-ARM 42X72 X-RAY (DRAPES) ×9 IMPLANT
DRAPE LAPAROTOMY 100X72 PEDS (DRAPES) IMPLANT
DRAPE LAPAROTOMY 100X72X124 (DRAPES) ×3 IMPLANT
DRAPE LONG LASER MIC (DRAPES) IMPLANT
DRAPE MICROSCOPE LEICA (MISCELLANEOUS) IMPLANT
DRAPE POUCH INSTRU U-SHP 10X18 (DRAPES) ×3 IMPLANT
DRSG OPSITE POSTOP 4X10 (GAUZE/BANDAGES/DRESSINGS) ×3 IMPLANT
DURAPREP 26ML APPLICATOR (WOUND CARE) ×3 IMPLANT
ELECT REM PT RETURN 9FT ADLT (ELECTROSURGICAL) ×3
ELECTRODE REM PT RTRN 9FT ADLT (ELECTROSURGICAL) ×1 IMPLANT
GAUZE SPONGE 4X4 12PLY STRL (GAUZE/BANDAGES/DRESSINGS) IMPLANT
GAUZE SPONGE 4X4 16PLY XRAY LF (GAUZE/BANDAGES/DRESSINGS) ×3 IMPLANT
GLOVE BIOGEL PI IND STRL 7.5 (GLOVE) ×2 IMPLANT
GLOVE BIOGEL PI IND STRL 8.5 (GLOVE) ×2 IMPLANT
GLOVE BIOGEL PI INDICATOR 7.5 (GLOVE) ×4
GLOVE BIOGEL PI INDICATOR 8.5 (GLOVE) ×4
GLOVE ECLIPSE 7.5 STRL STRAW (GLOVE) ×3 IMPLANT
GLOVE ECLIPSE 8.5 STRL (GLOVE) ×6 IMPLANT
GLOVE ECLIPSE 9.0 STRL (GLOVE) ×3 IMPLANT
GLOVE EXAM NITRILE LRG STRL (GLOVE) IMPLANT
GLOVE EXAM NITRILE MD LF STRL (GLOVE) IMPLANT
GLOVE EXAM NITRILE XL STR (GLOVE) IMPLANT
GLOVE EXAM NITRILE XS STR PU (GLOVE) IMPLANT
GLOVE SURG SS PI 7.0 STRL IVOR (GLOVE) ×6 IMPLANT
GOWN STRL REUS W/ TWL LRG LVL3 (GOWN DISPOSABLE) IMPLANT
GOWN STRL REUS W/ TWL XL LVL3 (GOWN DISPOSABLE) ×3 IMPLANT
GOWN STRL REUS W/TWL 2XL LVL3 (GOWN DISPOSABLE) ×6 IMPLANT
GOWN STRL REUS W/TWL LRG LVL3 (GOWN DISPOSABLE)
GOWN STRL REUS W/TWL XL LVL3 (GOWN DISPOSABLE) ×6
HEMOSTAT POWDER SURGIFOAM 1G (HEMOSTASIS) ×15 IMPLANT
HEMOSTAT SURGICEL 2X14 (HEMOSTASIS) IMPLANT
KIT BASIN OR (CUSTOM PROCEDURE TRAY) ×3 IMPLANT
KIT ROOM TURNOVER OR (KITS) ×3 IMPLANT
NEEDLE HYPO 22GX1.5 SAFETY (NEEDLE) ×3 IMPLANT
NS IRRIG 1000ML POUR BTL (IV SOLUTION) ×3 IMPLANT
PACK LAMINECTOMY NEURO (CUSTOM PROCEDURE TRAY) ×3 IMPLANT
PAD ARMBOARD 7.5X6 YLW CONV (MISCELLANEOUS) ×15 IMPLANT
PATTIES SURGICAL .25X.25 (GAUZE/BANDAGES/DRESSINGS) IMPLANT
PATTIES SURGICAL .5 X.5 (GAUZE/BANDAGES/DRESSINGS) ×3 IMPLANT
PATTIES SURGICAL .5 X1 (DISPOSABLE) ×6 IMPLANT
PATTIES SURGICAL .5 X3 (DISPOSABLE) IMPLANT
PATTIES SURGICAL 1/4 X 3 (GAUZE/BANDAGES/DRESSINGS) IMPLANT
PATTIES SURGICAL 1X1 (DISPOSABLE) ×6 IMPLANT
PROS SAW AESC 8MM MD922 ×3 IMPLANT
ROD RELINE-O 5.5X120MM LORD (Rod) ×6 IMPLANT
RUBBERBAND STERILE (MISCELLANEOUS) IMPLANT
SCREW LOCK RELINE 5.5 TULIP (Screw) ×24 IMPLANT
SCREW RELINE-O POLY 5.5X45MM (Screw) ×24 IMPLANT
SPECIMEN JAR SMALL (MISCELLANEOUS) ×3 IMPLANT
SPONGE LAP 4X18 X RAY DECT (DISPOSABLE) IMPLANT
SPONGE SURGIFOAM ABS GEL 100 (HEMOSTASIS) ×9 IMPLANT
STAPLER SKIN PROX WIDE 3.9 (STAPLE) IMPLANT
STRIP CLOSURE SKIN 1/2X4 (GAUZE/BANDAGES/DRESSINGS) IMPLANT
STRIP ILIUM TRICORT 2.2CMX60MM (Neuro Prosthesis/Implant) ×3 IMPLANT
SUT NURALON 4 0 TR CR/8 (SUTURE) IMPLANT
SUT PDS AB 1 CTX 36 (SUTURE) IMPLANT
SUT PROLENE 6 0 BV (SUTURE) IMPLANT
SUT SILK 3 0 TIES 17X18 (SUTURE)
SUT SILK 3-0 18XBRD TIE BLK (SUTURE) IMPLANT
SUT VIC AB 1 CT1 18XBRD ANBCTR (SUTURE) ×2 IMPLANT
SUT VIC AB 1 CT1 8-18 (SUTURE) ×4
SUT VIC AB 2-0 CP2 18 (SUTURE) ×6 IMPLANT
SUT VIC AB 3-0 SH 8-18 (SUTURE) ×6 IMPLANT
SYR 20ML ECCENTRIC (SYRINGE) ×3 IMPLANT
SYR CONTROL 10ML LL (SYRINGE) ×3 IMPLANT
TOWEL OR 17X24 6PK STRL BLUE (TOWEL DISPOSABLE) ×3 IMPLANT
TOWEL OR 17X26 10 PK STRL BLUE (TOWEL DISPOSABLE) ×3 IMPLANT
TRAY FOLEY CATH 14FRSI W/METER (CATHETERS) ×3 IMPLANT
WATER STERILE IRR 1000ML POUR (IV SOLUTION) ×3 IMPLANT

## 2014-04-13 NOTE — Anesthesia Postprocedure Evaluation (Signed)
  Anesthesia Post-op Note  Patient: Carolyn [name]  Procedure(s) Performed: Procedure(s) (LRB): THORACIC LAMINECTOMY WITH FIXATION THORACIC SIX-THORACIC TEN FUSION (N/A)  Patient Location: PACU  Anesthesia Type: General  Level of Consciousness: awake and alert   Airway and Oxygen Therapy: Patient Spontanous Breathing  Post-op Pain: mild  Post-op Assessment: Post-op Vital signs reviewed, Patient's Cardiovascular Status Stable, Respiratory Function Stable, Patent Airway and No signs of Nausea or vomiting  Last Vitals:  Filed Vitals:   04/13/14 1702  BP: 102/49  Pulse: 69  Temp:   Resp: 11    Post-op Vital Signs: stable   Complications: No apparent anesthesia complications

## 2014-04-13 NOTE — Progress Notes (Signed)
UR complete.  Eliya Bubar RN, MSN 

## 2014-04-13 NOTE — Transfer of Care (Signed)
Immediate Anesthesia Transfer of Care Note  Patient: Carolyn [name]  Procedure(s) Performed: Procedure(s): THORACIC LAMINECTOMY WITH FIXATION THORACIC SIX-THORACIC TEN FUSION (N/A)  Patient Location: PACU  Anesthesia Type:General  Level of Consciousness: awake, alert  and oriented  Airway & Oxygen Therapy: Patient connected to nasal cannula oxygen  Post-op Assessment: Report given to RN  Post vital signs: stable  Last Vitals:  Filed Vitals:   04/13/14 1038  BP: 117/59  Pulse: 76  Temp: 36.7 C  Resp: 19    Complications: No apparent anesthesia complications

## 2014-04-13 NOTE — Anesthesia Preprocedure Evaluation (Addendum)
Anesthesia Evaluation  Patient identified by MRN, date of birth, ID band Patient awake  General Assessment Comment:HPI: Patient is a 44 year old individual who's had the onset of weakness in her legs and unsteadiness of gait in the past 24-48 hours. She notes some periumbilical numbness may be a bit longer. She came into the hospital today because she was having difficulty walking. 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 There is high-grade cord compression and the central canal is narrowed to 4 mm in the AP diameter at T8. She is now being admitted to [facility] and will be scheduled for surgical decompression of the thoracic spine with fixation and fusion from T6-T10 early tomorrow. High-dose Decadron is being started.  Reviewed: Allergy & Precautions, NPO status , Patient's Chart, lab work & pertinent test results, reviewed documented beta blocker date and time   Airway Mallampati: I  TM Distance: >3 FB Neck ROM: Full    Dental no notable dental hx. (+) Teeth Intact   Pulmonary neg pulmonary ROS,  breath sounds clear to auscultation  Pulmonary exam normal       Cardiovascular negative cardio ROS  Rhythm:Regular Rate:Normal     Neuro/Psych negative neurological ROS  negative psych ROS   GI/Hepatic negative GI ROS, Neg liver ROS,   Endo/Other  negative endocrine ROS  Renal/GU negative Renal ROS  negative genitourinary   Musculoskeletal negative musculoskeletal ROS (+)   Abdominal   Peds negative pediatric ROS (+)  Hematology negative hematology ROS (+)   Anesthesia Other Findings   Reproductive/Obstetrics negative OB ROS                          Anesthesia Physical Anesthesia Plan  ASA: III  Anesthesia Plan: General   Post-op Pain Management:    Induction:  Intravenous  Airway Management Planned: Oral ETT  Additional Equipment: Arterial line  Intra-op Plan:   Post-operative Plan: Extubation in OR  Informed Consent: I have reviewed the patients History and Physical, chart, labs and discussed the procedure including the risks, benefits and alternatives for the proposed anesthesia with the patient or authorized representative who has indicated his/her understanding and acceptance.   Dental advisory given  Plan Discussed with: CRNA and Surgeon  Anesthesia Plan Comments:         Anesthesia Quick Evaluation

## 2014-04-13 NOTE — ED Notes (Signed)
Belongings given to pt's [name] at bedside.

## 2014-04-13 NOTE — Progress Notes (Signed)
Received pt from [facility]; patient vitals were stable; pt stated pain as a 7 out of 10, but did not want anymore additional pain medication for it.  Patients sister was at bedside; answered questions; orientated patient to room and floor.

## 2014-04-13 NOTE — Progress Notes (Signed)
INITIAL NUTRITION ASSESSMENT  DOCUMENTATION CODES Per approved criteria  -Non-severe (moderate) malnutrition in the context of chronic illness   INTERVENTION: Ensure Enlive po BID, each supplement provides 350 kcal and 20 grams of protein  NUTRITION DIAGNOSIS: Malnutrition related to chronic illness as evidenced by mild/moderate depletion of fat and muscle. .   Goal: Pt to meet >/= 90% of their estimated nutrition needs   Monitor:  Diet advancement, PO intake, weight trends  Reason for Assessment: Pt identified as at nutrition risk on the Malnutrition Screen Tool  ASSESSMENT: Pt with hx of metastatic breast cancer, s/p surgery, XRT, and chemo. Pt admitted with weakness, pt found to have T8 cord compression secondary to metastatic pathologic fracture of T8 involvement with tumor at T7 and T9.  Pt to have surgery 4/4 and start high-dose decadron.   Per pt she is usually around 140 lb and lost weight prior to her gall bladder surgery in Feb 2016. Since she has been eating more and has regained some of her weight.  Pt states she went from a size 6 to a size 8. Pt has not been drinking supplements at home but always eats nuts or eggs daily for extra protein.  We discussed her body type at length. Pt does have some mild depletion of fat and muscle that is not normal for her so she does have a non-severe malnutrition which seems to be improving per pt.  Nutrition Focused Physical Exam:  Subcutaneous Fat:  Orbital Region: mild/moderate depletion Upper Arm Region: WDL Thoracic and Lumbar Region: WDL  Muscle:  Temple Region: mild/moderate depletion Clavicle Bone Region: WDL  Clavicle and Acromion Bone Region: WDL Scapular Bone Region: WDL Dorsal Hand: WDL Patellar Region: WDL Anterior Thigh Region: WDL Posterior Calf Region: mild/moderate depletion  Edema: not present   Height: Ht Readings from Last 1 Encounters:  02/28/14 5\' 8"  (1.727 m)    Weight: Wt Readings from Last  1 Encounters:  02/28/14 132 lb 4.4 oz (60 kg)    Ideal Body Weight: 63.6 kg   % Ideal Body Weight: 94%  Wt Readings from Last 10 Encounters:  02/28/14 132 lb 4.4 oz (60 kg)  02/23/14 135 lb (61.236 kg)  09/24/13 133 lb 11.2 oz (60.646 kg)  12/23/12 133 lb (60.328 kg)  12/16/12 133 lb 6.4 oz (60.51 kg)  09/10/12 135 lb (61.236 kg)  07/04/12 135 lb 8 oz (61.462 kg)  03/29/12 135 lb (61.236 kg)  01/16/12 137 lb 14.4 oz (62.551 kg)  12/29/11 137 lb (62.143 kg)    Usual Body Weight: 135 lb   % Usual Body Weight: 98%  BMI:  There is no weight on file to calculate BMI.  Estimated Nutritional Needs: Kcal: 1600-1800 Protein: 80-90 grams Fluid: > 1.6 L/day  Skin: WDL  Diet Order: Diet NPO time specified  EDUCATION NEEDS: -No education needs identified at this time  No intake or output data in the 24 hours ending 04/13/14 0957  Last BM: PTA   Labs:   Recent Labs Lab 04/12/14 1728  NA 144  K 3.5  CL 107  CO2 26  BUN 8  CREATININE 0.80  CALCIUM 9.4  GLUCOSE 111*    CBG (last 3)  No results for input(s): GLUCAP in the last 72 hours.  Scheduled Meds:   Continuous Infusions: . sodium chloride 75 mL/hr at 04/13/14 [place], [place], [place] Pager [phone] After Hours Pager

## 2014-04-13 NOTE — Progress Notes (Signed)
[facility] Physician-Brief Progress Note Patient Name: Carolyn [name] DOB: 26-May-1970 MRN: [id number]   Date of Service  04/13/2014  HPI/Events of Note  Metastatic brast cancer to T8 with cord compression s/p thoracic fusion, hemodynamically stable  eICU Interventions  none   New ICU patient evaluation: This patient was evaluated by the [facility] team. I have reviewed relevant documentation including care plan & orders.    Intervention Category Evaluation Type: New Patient Evaluation  ALVA,RAKESH V. 04/13/2014, 6:23 PM

## 2014-04-13 NOTE — Progress Notes (Signed)
COURTESY NOTE: Carolyn [name] not transferred yet so missed her but spoke with the family and will see hear again tomorrow.  The spine MRI on further reading sugegsted possible CNS metastatic disease; I have ordered a brain MRI for tomorrow-- please postpone if you feel the patient cannot tolerated it then.  A summary of her breast cancer history to February 2016 (when I last saw her) follows:   '43 y.o. BRCA negative [place] woman status post right breast upper outer quadrant and right axillary lymph node biopsy 05/18/2011, both positive for an invasive ductal carcinoma, high-grade, clinicallyT2 N1-2 or stage IIB/IIIA, estrogen receptor 100% positive, progesterone receptor 87% positive, with an MIB-1 of 14% and no HER-2 amplification ([id number]).  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   (3)Treated neoadjuvantly with cyclophosphamide and docetaxel x4 completed 08/29/2011.  (4) status post right modified radical mastectomy 10/03/2011 showing a residual pT1c pN1a (3/18 lymph nodes positive) invasive ductal carcinoma, grade 1,estrogen receptor 100% positive, progesterone receptor negative, with no HER-2 amplification ([id number] [id number])  (5) adjuvant radiation to the right chest wall, right supraclavicular fossa and right scar completed 12/26/2011  (6) tamoxifen started January 2014  (7) status post total hysterectomy with bilateral salpingo-oophorectomy 12/23/2012 with benign pathology ([id number])  (7) anterolateral mastectomy pending, to be followed by reconstruction"

## 2014-04-13 NOTE — Anesthesia Procedure Notes (Signed)
Procedure Name: Intubation Date/Time: 04/13/2014 12:42 PM Performed by: Rush Farmer E Pre-anesthesia Checklist: Patient identified, Emergency Drugs available, Suction available, Patient being monitored and Timeout performed Patient Re-evaluated:Patient Re-evaluated prior to inductionOxygen Delivery Method: Circle system utilized Preoxygenation: Pre-oxygenation with 100% oxygen Intubation Type: IV induction Ventilation: Mask ventilation without difficulty Laryngoscope Size: Mac and 3 Grade View: Grade I Tube type: Oral Tube size: 7.0 mm Number of attempts: 1 Airway Equipment and Method: Stylet Placement Confirmation: ETT inserted through vocal cords under direct vision,  positive ETCO2 and breath sounds checked- equal and bilateral Secured at: 21 cm Tube secured with: Tape Dental Injury: Teeth and Oropharynx as per pre-operative assessment

## 2014-04-13 NOTE — Op Note (Signed)
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 History of breast cancer Procedure: Laminectomy T8 decompression of spinal cord posterior fixation from T6-T10 with pedicle screws and rods posterior arthrodesis with allograft. Surgeon: Kristeen Miss Asst.: Earnie Larsson  M.D. Anesthesia: Gen. endotracheal Indications: Carolyn [name] is a 44 year old individual who had a diagnosis of breast cancer 2 years ago. She is doing fairly well until several days before her admission she noted some peri-umbilicus numbness and then she started developed weakness in unsteadiness of gait and difficulty with balance. She is seen in the emergency room yesterday where an MRI of the thoracic spine was performed demonstrating a high-grade compression of T8 with spinal cord compromise. As also noted be vertebral involvement of T7 and T9 in addition to some involvement in the lumbar vertebrae. She is advised regarding the need for surgical decompression.  Procedure: The patient was brought to the operating room supine on a stretcher. After the smooth induction of general endotracheal anesthesia and placement of a Foley catheter patient was turned prone the back was prepped with alcohol and DuraPrep and draped in a sterile fashion. Midline incision was created over an area designated is T8 confirmed with radiography. The thoracic of dorsal fascia was opened and a subperiosteal fashion dissecting away from the spinous processes from T6-T10. T8 was then removed using a [place] rongeur and as the right lateral area of the pedicle was approached there was noted to be cavitation into the bone and substantial bleeding was encountered almost immediately. This required tamponade with Gelfoam soaked thrombin. The bleeding was brisk. After  decompressing this a bit further of the left side was decompressed and similar breakthrough into the pedicle was obtained again with brisk bleeding. Each time dissection would be attempted brisk arterial type bleeding would be encountered from a very gelatinous tissue within the pedicle itself. Biopsies of this tissue were obtained and sent for permanent sections. The procedure was slowed by the bleeding that was encountered but ultimately after working bit by bit from side to side with the help of Dr. pools were able to achieve a good posterior decompression and then also a good ventral decompression by [place] nodding and tamping tumor into the vertebral body and then removing it in a piecemeal fashion after controlling the hemorrhage. Ultimately were able to control the hemorrhage once we resected most of the pedicles on either side. Then when hemostasis was achieved and it was felt that we had a good decompression I began the posterior fixation area  Pedicle entry sites were chosen with fluoroscopic guidance and also using external landmarks. 5.5 x 45 mm pedicle screws were then placed sequentially at T6 T7 T9 and T10. Once these were placed and radiographically confirmed in the AP and lateral projections 120 mm precontoured rods were placed between the screw heads and tightened in a neutral construct. A transverse connector was applied. The posterior elements were then decorticated amply and then a tricortical bone graft was cut longitudinally and piece of each graft was placed between the spinous process and laminar arch complex from T7-T9. [place] were then placed between T8 and T10 and T6 and T8. Once the graft was placed hemostasis was checked the dorsal dura was inspected carefully to make sure was adequately decompressed and then the thoracodorsal fascia was closed with #1 Vicryl interrupted fashion 2-0 Vicryl using subcutaneous tissues 20  mL of half percent Marcaine was injected into the paraspinous  fascia, and then 3-0 Vicryl was used to close the testicular skin. Blood loss was estimated at approximately 1100 mL by myself and that no Cell Saver blood was used the patient did receive 2 units of packed cells during this case.

## 2014-04-13 NOTE — ED Notes (Signed)
Pt still waiting for transport

## 2014-04-14 ENCOUNTER — Inpatient Hospital Stay (HOSPITAL_COMMUNITY): Payer: BC Managed Care – PPO

## 2014-04-14 ENCOUNTER — Encounter (HOSPITAL_COMMUNITY): Payer: Self-pay | Admitting: Neurological Surgery

## 2014-04-14 LAB — CBC
HEMATOCRIT: 22.5 % — AB (ref 36.0–46.0)
Hemoglobin: 7.7 g/dL — ABNORMAL LOW (ref 12.0–15.0)
MCH: 30 pg (ref 26.0–34.0)
MCHC: 34.2 g/dL (ref 30.0–36.0)
MCV: 87.5 fL (ref 78.0–100.0)
Platelets: 90 10*3/uL — ABNORMAL LOW (ref 150–400)
RBC: 2.57 MIL/uL — ABNORMAL LOW (ref 3.87–5.11)
RDW: 12.4 % (ref 11.5–15.5)
WBC: 9.4 10*3/uL (ref 4.0–10.5)

## 2014-04-14 NOTE — Progress Notes (Signed)
Called [employer] to order TLSO brace for pt mobilization. Item cannot be ordered until tomorrow morning as vendor is closed. [employer] will place order first thing tomorrow morning.

## 2014-04-14 NOTE — Progress Notes (Signed)
Patient ID: Carolyn [name], female   DOB: October 11, 1970, 44 y.o.   MRN: [id number] Vital signs are stable though patient is tachycardic in lower 100s. Clinically patient feels legs are moving better and numbness from [place] umbilical region is gone. MRI of brain was performed today and this demonstrates presence of a deep right parietal lesion measuring approximately a centimeter in diameter with much peritumoral edema. There is also a lesion in the left temporal bone and lesion in the clivus.  At this time patient will require further treatment of both the metastatic disease in the spine and the intracranial lesions. We will obtain a CBC today to check status of patient's anemia Will obtain a TLSO so that patient may be mobilized Continue to observe in intensive care unit until tachycardia is more stabilized.

## 2014-04-15 ENCOUNTER — Inpatient Hospital Stay (HOSPITAL_COMMUNITY): Payer: BC Managed Care – PPO

## 2014-04-15 ENCOUNTER — Encounter (HOSPITAL_COMMUNITY): Payer: Self-pay | Admitting: Radiology

## 2014-04-15 ENCOUNTER — Encounter (HOSPITAL_COMMUNITY): Payer: BC Managed Care – PPO

## 2014-04-15 DIAGNOSIS — G9529 Other cord compression: Secondary | ICD-10-CM

## 2014-04-15 DIAGNOSIS — C50811 Malignant neoplasm of overlapping sites of right female breast: Secondary | ICD-10-CM

## 2014-04-15 DIAGNOSIS — C7931 Secondary malignant neoplasm of brain: Secondary | ICD-10-CM

## 2014-04-15 DIAGNOSIS — C7951 Secondary malignant neoplasm of bone: Secondary | ICD-10-CM

## 2014-04-15 DIAGNOSIS — M8458XA Pathological fracture in neoplastic disease, other specified site, initial encounter for fracture: Principal | ICD-10-CM

## 2014-04-15 MED ORDER — IOHEXOL 300 MG/ML  SOLN
80.0000 mL | Freq: Once | INTRAMUSCULAR | Status: AC | PRN
  Administered 2014-04-15: 80 mL via INTRAVENOUS

## 2014-04-15 MED ORDER — TECHNETIUM TC 99M MEDRONATE IV KIT
25.0000 | PACK | Freq: Once | INTRAVENOUS | Status: AC | PRN
  Administered 2014-04-15: 25 via INTRAVENOUS

## 2014-04-15 MED ORDER — WHITE PETROLATUM GEL
Status: AC
  Administered 2014-04-15: 1
  Filled 2014-04-15: qty 1

## 2014-04-15 MED ORDER — ENSURE ENLIVE PO LIQD
237.0000 mL | Freq: Two times a day (BID) | ORAL | Status: DC
  Administered 2014-04-15 – 2014-04-20 (×7): 237 mL via ORAL

## 2014-04-15 MED ORDER — MAGNESIUM HYDROXIDE 400 MG/5ML PO SUSP: 30.0000 mL | ORAL | Status: DC | PRN

## 2014-04-15 MED ORDER — DOCUSATE SODIUM 100 MG PO CAPS
100.0000 mg | ORAL_CAPSULE | Freq: Two times a day (BID) | ORAL | Status: DC
  Administered 2014-04-15: 100 mg via ORAL
  Filled 2014-04-15 (×3): qty 1

## 2014-04-15 MED ORDER — GADOBENATE DIMEGLUMINE 529 MG/ML IV SOLN
15.0000 mL | Freq: Once | INTRAVENOUS | Status: AC | PRN
  Administered 2014-04-15: 12 mL via INTRAVENOUS

## 2014-04-15 NOTE — Progress Notes (Signed)
[facility] Progress Note Patient Details:  Carolyn [name] 05-Jul-1970 [id number]  Patient ID: [name], female   DOB: 21-Jun-1970, 44 y.o.   MRN: [id number]  Called in [employer] brace order; spoke with Dolores Lory, Nyaisha Simao 04/15/2014, 9:21 AM

## 2014-04-15 NOTE — Progress Notes (Signed)
Chaplain responded to spiritual care consult. Pt told chaplain about her history of cancer for the last three years and recent gallbladder surgery. Pt and chaplain discussed coping strategies and family support. Faith and family support are very important to pt and she sees them as her source of strength. Chaplain offered emotional support, prayer, and brought pt a prayer shawl. Chaplain will continue to follow. Page chaplain as needed.    04/15/14 1000  Clinical Encounter Type  Visited With Patient  Visit Type Initial;Spiritual support  Referral From Nurse  Spiritual Encounters  Spiritual Needs Emotional;Prayer  Stress Factors  Patient Stress Factors Health changes  Malakai Schoenherr, Barbette Hair, Chaplain 04/15/2014 10:29 AM

## 2014-04-15 NOTE — Progress Notes (Addendum)
Patient ID: Carolyn [name], female   DOB: 21-Jun-1970, 44 y.o.   MRN: [id number] Vital signs are stable Patient is starting to ambulate but requiring substantial assistance Legs are weaker than they appear on testing in bed I believe that ultimately she may require a branch of inpatient rehabilitation Appreciate Dr. Dorathy Kinsman note in scheduling a full staging and further workup Intracranial lesion will be discussed in cancer conference I will ask rehabilitation medicine to evaluate patient In the meantime she is being transferred to the floor In my absence I have asked Dr. Earnie Larsson to follow this patient. Possibility of discharge this weekend if strength continues to improve

## 2014-04-15 NOTE — Consult Note (Signed)
[name]  Telephone:(336) [phone] [phone]) [phone]     ID: Carolyn [name] DOB: 1970/03/21  MR#: [id number]  SWN#:[id number]  Patient Care Team: [name], MD as PCP - General (Family Medicine) Thea Silversmith, MD (Radiation Oncology) Neldon Mc, MD as Surgeon (General Surgery) Chauncey Cruel, MD as Consulting Physician (Oncology) PCP: [name], MD GYN: SU:  OTHER MD: Renaldo Reel  CHIEF COMPLAINT: stage IV breast cancer  CURRENT TREATMENT: consider SRS; start anastrozole/ palbociclib as outpatient  INTERVAL HISTORY: Moneisha noted some strange feelings around her umbilicus 93/81/8299. This felt like an area of numbness. Over the next 2 days she noted some leg weakness and difficulty walking, so she presented to the ED 04/12/2014. MRI of the thoraco-lumbar spine was obtained 04/12/2014 showing multiple bone lesions and compression fracture at T8 with retropulsion and cord compression. On 04/13/2014 she underwent Laminectomy T8 decompression of spinal cord posterior fixation from T6-T10 with pedicle screws and rods posterior arthrodesis with allograft. She did well with her surgery and already feels significant improvement in her LE strength. 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 There were nonspecific L temporal bone changes and also possible involvement of the clivus and calvarium, but no other parenchymal brain lesions. CT scan of the chest is pending to complete restaging.  REVIEW OF SYSTEMS: [name] tells me she did "great" with the surgery. She has a foley in place, but has already had a normal BM. She feels her legs are much stronger. The numbness she felt around her umbilicus has resolved. Pain is well-controlled. No family in room  BREAST CANCER HISTORY: From doctor Khan's of original intake node 05/31/2011:  "Carolyn [name] is a 44  y.o. female. Without significant past medical history. She underwent a mammogram that showed calcifications measuring 5 cm on the right breast. She then went on To have an ultrasound which showed the area to measure 2.9 cm. She was also found to have a right axillary lymph node that was suspicious for a malignancy. She had a biopsy of the calcifications that showed high-grade ductal carcinoma in situ. 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 In the lymph node biopsy there was no lymphatic tissue seen and was felt that the node was replaced by tumor. Patient went on to have an MRI of the bilateral breasts performed on evening of 05/30/2011. The MRI showed in the right breast 5 x 3 x 4.5 cm mass abutting the chest wall. About 7 cm away from this there was another mass in the upper inner quadrant that measured 1.5 x 1.7 x 1.5 cm. On the contralateral breast, that is the left breast a 2 cm area suspicious enhancement was noted also this up to date has not been biopsied and arrangements are being made for the biopsy to be performed. In this side there were no suspicious lymph nodes. The prognostic panel is pending. Patient is otherwise without any complaints. "  Her subsequent history is as detailed below  PAST MEDICAL HISTORY: Past Medical History  Diagnosis Date  . 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 Hx of radiation therapy 11/08/11 -12/26/11    right chest wall/supraclav fossa, right scar  . History of chemotherapy 06/27/11 -09/04/11    neoadjuvant    PAST SURGICAL HISTORY:  Past Surgical History  Procedure Laterality Date  . Wisdom tooth extraction    . Portacath placement  06/20/2011    Procedure: INSERTION PORT-A-CATH;  Surgeon: Haywood Lasso, MD;  Location: [facility];  Service: General;  Laterality: N/A;  . Modified mastectomy  10/03/2011    Procedure: MODIFIED MASTECTOMY;  Surgeon: Haywood Lasso, MD;  Location: [facility];  Service: General;  Laterality: Right;  . Port-a-cath removal  01/30/2012    Procedure: MINOR REMOVAL PORT-A-CATH;  Surgeon: Haywood Lasso, MD;  Location: [facility];  Service: General;  Laterality: Left;  . Breast biopsy      Left  . Robotic assisted total hysterectomy with bilateral salpingo oopherectomy Bilateral 12/23/2012    Procedure: ROBOTIC ASSISTED TOTAL HYSTERECTOMY WITH BILATERAL SALPINGO OOPHORECTOMY;  Surgeon: Marvene Staff, MD;  Location: [facility];  Service: Gynecology;  Laterality: Bilateral;  . Cholecystectomy N/A 03/01/2014    Procedure: LAPAROSCOPIC CHOLECYSTECTOMY;  Surgeon: Leighton Ruff, MD;  Location: [facility];  Service: General;  Laterality: N/A;  . Laminectomy N/A 04/13/2014    Procedure: THORACIC LAMINECTOMY WITH FIXATION THORACIC SIX-THORACIC TEN FUSION;  Surgeon: Kristeen Miss, MD;  Location: [facility];  Service: Neurosurgery;  Laterality: N/A;    FAMILY HISTORY Family History  Problem Relation Age of Onset  . Breast cancer Maternal Aunt 67  . Pancreatic cancer Maternal Grandfather   . Pancreatic cancer Maternal Aunt     died in her 4s  . Leukemia Maternal Aunt     died in her 25s  . Ovarian cancer Cousin 37    maternal cousin    GYNECOLOGIC HISTORY:  Patient's last menstrual period was 07/02/2011..  Menarche age 15, she is GX P0. She underwent total hysterectomy with bilateral salpingo-oophorectomy December 2014.  SOCIAL HISTORY:  Carolyn [name] works at [employer] for [employer]. She mostly deals with government documents. She is single, lives by herself, with no pets. She attends [employer] locally.     ADVANCED DIRECTIVES: Not in place. The patient has a documents and intends to name her sister Carolyn [name] as healthcare power of attorney. Carolyn [name] lives in [place] and can be reached at [place]: History  Substance Use Topics  . Smoking status:  Never Smoker   . Smokeless tobacco: Never Used  . Alcohol Use: No     Colonoscopy:  PAP:  Bone density:  Lipid panel:  Allergies  Allergen Reactions  . Allegra [Fexofenadine] Hives    Abdomen only  . Shellfish Allergy Hives    Abdomen only    Current Facility-Administered Medications  Medication Dose Route Frequency Provider Last Rate Last Dose  . 0.9 %  sodium chloride infusion   Intravenous Continuous Kristeen Miss, MD 75 mL/hr at 04/14/14 2207    . HYDROcodone-acetaminophen (NORCO/VICODIN) 5-325 MG per tablet 1 tablet  1 tablet Oral Q4H PRN Kristeen Miss, MD   1 tablet at 04/13/14 2348  . HYDROmorphone (DILAUDID) injection 0.25-0.5 mg  0.25-0.5 mg Intravenous Q5 min PRN Myrtie Soman, MD   0.5 mg at 04/15/14 0343  . morphine 2 MG/ML injection 2 mg  2 mg Intravenous Q2H PRN Kristeen Miss, MD   2 mg at 04/14/14 1833  . promethazine (PHENERGAN) injection 6.25-12.5 mg  6.25-12.5 mg Intravenous Q15 min PRN Myrtie Soman, MD        OBJECTIVE: middle aged African American woman examined in bed Filed Vitals:   04/15/14 0700  BP: 101/60  Pulse: 100  Temp:   Resp: 15     Body mass index is 19.61 kg/(m^2).      Ocular: Sclerae unicteric, EOMs intact Ear-nose-throat: Oropharynx clear Neuro: moves both legs easily; dorsiflexion 5/5 bilat Breasts: deferred   LAB RESULTS:  CMP     Component Value Date/Time   NA 144 04/12/2014 1728   NA 145 02/23/2014 1524   K 3.5 04/12/2014 1728   K 3.9 02/23/2014 1524   CL 107 04/12/2014 1728   CL 102 01/16/2012 0804   CO2 26 04/12/2014 1728   CO2 29 02/23/2014 1524   GLUCOSE 111* 04/12/2014 1728   GLUCOSE 83 02/23/2014 1524   GLUCOSE 92 01/16/2012 0804   BUN 8 04/12/2014 1728   BUN 14.7 02/23/2014 1524   CREATININE 0.80 04/12/2014 1728   CREATININE 0.8 02/23/2014 1524   CALCIUM 9.4 04/12/2014 1728   CALCIUM 9.7 02/23/2014 1524   PROT 7.2 04/12/2014 1728   PROT 7.3 02/23/2014 1524   ALBUMIN 4.1 04/12/2014 1728   ALBUMIN 3.9 02/23/2014  1524   AST 22 04/12/2014 1728   AST 19 02/23/2014 1524   ALT 15 04/12/2014 1728   ALT 14 02/23/2014 1524   ALKPHOS 102 04/12/2014 1728   ALKPHOS 126 02/23/2014 1524   BILITOT 0.5 04/12/2014 1728   BILITOT 0.31 02/23/2014 1524   GFRNONAA 88* 04/12/2014 1728   GFRAA >90 04/12/2014 1728    INo results found for: SPEP, UPEP  Lab Results  Component Value Date   WBC 9.4 04/14/2014   NEUTROABS 2.1 02/28/2014   HGB 7.7* 04/14/2014   HCT 22.5* 04/14/2014   MCV 87.5 04/14/2014   PLT 90* 04/14/2014    _0 @  Lab Results  Component Value Date   LABCA2 24 05/31/2011    No components found for: [id number]  No results for input(s): INR in the last 168 hours.  Urinalysis No results found for: COLORURINE, APPEARANCEUR, LABSPEC, PHURINE, GLUCOSEU, HGBUR, BILIRUBINUR, KETONESUR, PROTEINUR, UROBILINOGEN, NITRITE, LEUKOCYTESUR  STUDIES: Dg Thoracic Spine 2 View  04/13/2014   CLINICAL DATA:  T6 through T10 laminectomy and fusion.  EXAM: THORACIC SPINE - 2 VIEW; DG C-ARM 61-120 MIN  COMPARISON:  04/12/2014  FINDINGS: Posterolateral rod and pedicle screw fixation noted at T6-T7-T9-T10, with a cross leakage at the T8 vertebral level but no pedicle screw at the T8 vertebral level. T8 compression fracture observed.  IMPRESSION: 1. Posterolateral rod and pedicle screw fixation at Q3-F3-L4-T62, without complicating feature or malpositioning identified. 2. Pathologic T8 compression fracture.   Electronically Signed   By: Van Clines M.D.   On: 04/13/2014 16:03   Dg Thoracic Spine 2 View  04/13/2014   CLINICAL DATA:  Pathologic compression of the T8 vertebral body with retropulsion. Intraoperative localization.  EXAM: THORACIC SPINE - 2 VIEW  COMPARISON:  MRI of the thoracic spine on 04/12/2014  FINDINGS: Two separate intraoperative films demonstrate needle placement along the lower margin of the T8 vertebral body. The vertebral body is visibly compressed relative to the T7 and T9 levels.   IMPRESSION: Intraoperative localization along the inferior margin of the T8 vertebral body.   Electronically Signed   By: Aletta Edouard M.D.   On: 04/13/2014 13:49   Mr Jeri Cos BW Contrast  04/14/2014   CLINICAL DATA:  Metastatic breast cancer. Leg weakness from pathologic compression deformity and epidural tumor, with recent stabilization. Incidental brain edema discovered on spinal MR screening.  EXAM: MRI HEAD WITHOUT AND WITH CONTRAST  TECHNIQUE: Multiplanar, multiecho pulse sequences of the brain  and surrounding structures were obtained without and with intravenous contrast.  CONTRAST:  MultiHance 12 mL.  COMPARISON:  Survey images from MRI thoracic spine 04/12/2014.  FINDINGS: There is no acute stroke or hemorrhage. No hydrocephalus or extra-axial fluid.  There is a 7 mm LEFT centrum semiovale brain metastasis, having reduced signal on T2 weighted images, no restriction of diffusion, and displaying mild susceptibility on gradient sequence. Avid postcontrast enhancement. No other definite metastases.  In the LEFT medial mastoid portion of the temporal bone, there is an asymmetric T2 hyperintense lesion 9 x 14 mm cross-section which shows mild postcontrast enhancement. This would be an unusual location for metastatic disease but this is not excluded given the osseous disease elsewhere. Temporal bone inflammatory process is also possible. Recommend CT of the temporal bones with contrast for further evaluation.  Mild disease of the clivus is suspected in its superior aspect. This is questioned on precontrast T1 weighted images inferior and posterior to the sella. Heterogeneity of calvarial bone marrow suggest osseous diploic involvement is possible.  No sinus or orbital disease. Negative RIGHT temporal bone. Major dural venous sinuses are patent.  IMPRESSION: 7 mm apparently solitary intra-axial intracranial metastasis with marked surrounding edema and mild susceptibility suggesting hemorrhage or  calcification.  Unusual LEFT medial mastoid temporal bone enhancing lesion 9 x 14 mm cross-section. Consider CT temporal bone with contrast for further evaluation.  Metastatic disease to the clivus and calvarium not excluded. See discussion above.   Electronically Signed   By: Rolla Flatten M.D.   On: 04/14/2014 11:54   Mr Thoracic Spine W Wo Contrast  04/12/2014   ADDENDUM REPORT: 04/12/2014 21:27  ADDENDUM: Subsequently noted on the whole body localizer 3 plane screening series #3 is asymmetric edema in the LEFT frontoparietal lobe concerning for a brain metastasis. MRI brain without and with contrast is recommended for further evaluation. Findings discussed with ordering provider.   Electronically Signed   By: Rolla Flatten M.D.   On: 04/12/2014 21:27   04/12/2014   CLINICAL DATA:  History of breast cancer with radiation and chemotherapy in 2013. Lower extremity weakness and difficulty bearing weight with back pain beginning a few weeks ago. Initial encounter.  EXAM: MRI THORACIC AND LUMBAR SPINE WITHOUT AND WITH CONTRAST  TECHNIQUE: Multiplanar and multiecho pulse sequences of the thoracic and lumbar spine were obtained without and with intravenous contrast.  CONTRAST:  37m MULTIHANCE GADOBENATE DIMEGLUMINE 529 MG/ML IV SOLN  COMPARISON:  None.  FINDINGS: MR THORACIC SPINE FINDINGS  Numbering of the thoracic spine was accomplished by counting down from the odontoid. There was an initial problem with the surface coil which was replaced mid way through the exam.  Widespread osseous metastatic disease is present. There is a pathologic compression fracture at T8 with slight retropulsion. Significant tumor involvement of the T7 and T9 vertebrae as well. Loss of approximately 50% vertebral body height at T8 is noted. Posterior element involvement is prominent at T8. Epidural tumor, along with retropulsed pathologic bone, extends into the spinal canal at T8, greater on the RIGHT, with significant ventral mass effect  on the cord. Canal AP diameter is severely narrowed approximately 5 mm. Slight abnormal cord signal suspected at the T8 level on STIR imaging. The exiting foraminal nerve roots at T7-8 and T8-9 bilaterally are significantly affected, worse on the RIGHT.  No other areas of epidural tumor, but foci of metastatic disease are seen elsewhere in the thoracic spine including T6-7 posterior elements and T12 vertebral body most  notably.  MR LUMBAR SPINE FINDINGS  No pathologic compression fracture, but widespread osseous involvement throughout the lumbar spine. The largest tumor deposit is seen in the L5 vertebral body anteriorly. Minimal superior endplate depression at L1. No epidural tumor. Small foci of sacral involvement. Normal conus and cauda equina nerve roots. No paravertebral masses.  IMPRESSION: Pathologic compression fracture T8. Retropulsed bone and epidural tumor result in cord compression at this level. Significant tumor involvement at T7 and T9 without retropulsion.  Widespread osseous disease throughout the thoracic and lumbar spine, but no significant epidural tumor elsewhere or pathologic compression.  Findings discussed with ordering provider by myself at the time of interpretation.  Electronically Signed: By: Rolla Flatten M.D. On: 04/12/2014 20:57   Mr Lumbar Spine W Wo Contrast (assess For Abscess, Cord Compression)  04/12/2014   ADDENDUM REPORT: 04/12/2014 21:27  ADDENDUM: Subsequently noted on the whole body localizer 3 plane screening series #3 is asymmetric edema in the LEFT frontoparietal lobe concerning for a brain metastasis. MRI brain without and with contrast is recommended for further evaluation. Findings discussed with ordering provider.   Electronically Signed   By: Rolla Flatten M.D.   On: 04/12/2014 21:27   04/12/2014   CLINICAL DATA:  History of breast cancer with radiation and chemotherapy in 2013. Lower extremity weakness and difficulty bearing weight with back pain beginning a few  weeks ago. Initial encounter.  EXAM: MRI THORACIC AND LUMBAR SPINE WITHOUT AND WITH CONTRAST  TECHNIQUE: Multiplanar and multiecho pulse sequences of the thoracic and lumbar spine were obtained without and with intravenous contrast.  CONTRAST:  51m MULTIHANCE GADOBENATE DIMEGLUMINE 529 MG/ML IV SOLN  COMPARISON:  None.  FINDINGS: MR THORACIC SPINE FINDINGS  Numbering of the thoracic spine was accomplished by counting down from the odontoid. There was an initial problem with the surface coil which was replaced mid way through the exam.  Widespread osseous metastatic disease is present. There is a pathologic compression fracture at T8 with slight retropulsion. Significant tumor involvement of the T7 and T9 vertebrae as well. Loss of approximately 50% vertebral body height at T8 is noted. Posterior element involvement is prominent at T8. Epidural tumor, along with retropulsed pathologic bone, extends into the spinal canal at T8, greater on the RIGHT, with significant ventral mass effect on the cord. Canal AP diameter is severely narrowed approximately 5 mm. Slight abnormal cord signal suspected at the T8 level on STIR imaging. The exiting foraminal nerve roots at T7-8 and T8-9 bilaterally are significantly affected, worse on the RIGHT.  No other areas of epidural tumor, but foci of metastatic disease are seen elsewhere in the thoracic spine including T6-7 posterior elements and T12 vertebral body most notably.  MR LUMBAR SPINE FINDINGS  No pathologic compression fracture, but widespread osseous involvement throughout the lumbar spine. The largest tumor deposit is seen in the L5 vertebral body anteriorly. Minimal superior endplate depression at L1. No epidural tumor. Small foci of sacral involvement. Normal conus and cauda equina nerve roots. No paravertebral masses.  IMPRESSION: Pathologic compression fracture T8. Retropulsed bone and epidural tumor result in cord compression at this level. Significant tumor  involvement at T7 and T9 without retropulsion.  Widespread osseous disease throughout the thoracic and lumbar spine, but no significant epidural tumor elsewhere or pathologic compression.  Findings discussed with ordering provider by myself at the time of interpretation.  Electronically Signed: By: JRolla FlattenM.D. On: 04/12/2014 20:57   Dg C-arm 61-120 Min  04/13/2014   CLINICAL  DATA:  T6 through T10 laminectomy and fusion.  EXAM: THORACIC SPINE - 2 VIEW; DG C-ARM 61-120 MIN  COMPARISON:  04/12/2014  FINDINGS: Posterolateral rod and pedicle screw fixation noted at T6-T7-T9-T10, with a cross leakage at the T8 vertebral level but no pedicle screw at the T8 vertebral level. T8 compression fracture observed.  IMPRESSION: 1. Posterolateral rod and pedicle screw fixation at L8-X2-J1-H41, without complicating feature or malpositioning identified. 2. Pathologic T8 compression fracture.   Electronically Signed   By: Van Clines M.D.   On: 04/13/2014 16:03    ASSESSMENT: 44 y.o. BRCA negative [place] woman status post right breast upper outer quadrant and right axillary lymph node biopsy 05/18/2011, both positive for an invasive ductal carcinoma, high-grade, clinicallyT2 N1-2 or stage IIB/IIIA, estrogen receptor 100% positive, progesterone receptor 87% positive, with an MIB-1 of 14% and no HER-2 amplification ([id number]).  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  (a) VUS were also found in [place] and BARD1  (2) additional right breast biopsy upper inner quadrant 06/08/2011 showed only a fibroadenoma, and central left breast biopsy for another suspicious lesion showed only fibrocystic changes ([id number] and [id number]).   (3)Treated neoadjuvantly with cyclophosphamide and docetaxel x4 completed 08/29/2011.  (4) status post right modified radical mastectomy 10/03/2011 showing a residual pT1c pN1a (3/18 lymph nodes positive)  invasive ductal carcinoma, grade 1,estrogen receptor 100% positive, progesterone receptor negative, with no HER-2 amplification ([id number])  (5) adjuvant radiation to the right chest wall, right supraclavicular fossa and right scar completed 12/26/2011  (6) tamoxifen started January 2014  (7) status post total hysterectomy with bilateral salpingo-oophorectomy 12/23/2012 with benign pathology ([id number])  METASTATIC DISEASE 04/13/2014 (8) presented with T8 cord compression and underwent laminectomy 04/13/2014 with T8 decompression of spinal cord, posterior fixation from T6-T10 with pedicle screws and rods, posterior arthrodesis with allograft.  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 Clearly her tamoxifen was not able to control her tumor. This has been stopped, and we will likely treat her with letrozole/ palbociclib (no chemo) unless CT scans show significant visceral lung or liver disease. We did not discuss the fact that stage IV breast cancer is not curable, but it is very treatable and we are having very good results in patients like her.  She will need:  (a) prognostic panel from 04/04 surgical specimen ([id number] [id number])  (b) CT chest and bone scan to complete staging  (c) review at brain tumor conference for consideration SRS  I will operationalize the above and schedule her to see me as outpatient in about 2 weeks at which point we will likely initiate her systemic therapy with anti-estrogens and cyclin inhibitors (letrozole and palbociclib)  Greatly appreciate your help to this patient! The patient has a good understanding of the overall plan. She agrees with it. She knows the goal of treatment in her case is cure. She will call with any problems that may develop before her next visit here.  Chauncey Cruel, MD    04/15/2014 7:39 AM Medical Oncology and Hematology [facility] [address] [place], [place] [address] Tel. [phone]    Fax. [phone]

## 2014-04-15 NOTE — Progress Notes (Signed)
Patient ID: Carolyn [name], female   DOB: 31-[name]-1970, 44 y.o.   MRN: [id number] Vital signs are stable Patient does not have TLSO yet We can mobilize her with the TLSO when it arrives Foley can be discontinued Believe that the patient can be discharged home once we know she is ambulating stably Further follow-up can be  done as an outpatient. I'll discuss her situation with radiation oncology to speed consultation there

## 2014-04-16 DIAGNOSIS — G959 Disease of spinal cord, unspecified: Secondary | ICD-10-CM

## 2014-04-16 DIAGNOSIS — M8448XS Pathological fracture, other site, sequela: Secondary | ICD-10-CM

## 2014-04-16 LAB — BASIC METABOLIC PANEL
Anion gap: 9 (ref 5–15)
BUN: 7 mg/dL (ref 6–23)
CO2: 27 mmol/L (ref 19–32)
Calcium: 7.9 mg/dL — ABNORMAL LOW (ref 8.4–10.5)
Chloride: 101 mmol/L (ref 96–112)
Creatinine, Ser: 0.75 mg/dL (ref 0.50–1.10)
GFR calc non Af Amer: >90 mL/min (ref >90)
Glucose, Bld: 185 mg/dL — ABNORMAL HIGH (ref 70–99)
Potassium: 3.4 mmol/L — ABNORMAL LOW (ref 3.5–5.1)
SODIUM: 137 mmol/L (ref 135–145)

## 2014-04-16 LAB — CBC
HEMATOCRIT: 20.7 % — AB (ref 36.0–46.0)
Hemoglobin: 7 g/dL — ABNORMAL LOW (ref 12.0–15.0)
MCH: 29.8 pg (ref 26.0–34.0)
MCHC: 33.8 g/dL (ref 30.0–36.0)
MCV: 88.1 fL (ref 78.0–100.0)
Platelets: 106 10*3/uL — ABNORMAL LOW (ref 150–400)
RBC: 2.35 MIL/uL — ABNORMAL LOW (ref 3.87–5.11)
RDW: 12.6 % (ref 11.5–15.5)
WBC: 9.2 10*3/uL (ref 4.0–10.5)

## 2014-04-16 MED ORDER — SODIUM CHLORIDE 0.9 % IV BOLUS (SEPSIS)
500.0000 mL | Freq: Once | INTRAVENOUS | Status: AC
  Administered 2014-04-16: 500 mL via INTRAVENOUS

## 2014-04-16 MED ORDER — POLYETHYLENE GLYCOL 3350 17 G PO PACK
17.0000 g | PACK | Freq: Every day | ORAL | Status: DC | PRN
  Administered 2014-04-16: 17 g via ORAL
  Filled 2014-04-16: qty 1

## 2014-04-16 MED ORDER — ONDANSETRON HCL 4 MG/2ML IJ SOLN
4.0000 mg | INTRAMUSCULAR | Status: DC | PRN
  Administered 2014-04-16 – 2014-04-17 (×2): 4 mg via INTRAVENOUS
  Filled 2014-04-16 (×2): qty 2

## 2014-04-16 MED ORDER — METHOCARBAMOL 500 MG PO TABS
500.0000 mg | ORAL_TABLET | Freq: Four times a day (QID) | ORAL | Status: DC | PRN
  Administered 2014-04-20: 500 mg via ORAL
  Filled 2014-04-16: qty 1

## 2014-04-16 MED ORDER — OXYCODONE-ACETAMINOPHEN 5-325 MG PO TABS
1.0000 | ORAL_TABLET | ORAL | Status: DC | PRN
  Administered 2014-04-16 – 2014-04-19 (×17): 2 via ORAL
  Administered 2014-04-20: 1 via ORAL
  Administered 2014-04-20: 2 via ORAL
  Filled 2014-04-16 (×19): qty 2
  Filled 2014-04-16: qty 1
  Filled 2014-04-16: qty 2

## 2014-04-16 MED ORDER — FLEET ENEMA 7-19 GM/118ML RE ENEM: 1.0000 | ENEMA | Freq: Once | RECTAL | Status: AC | PRN

## 2014-04-16 MED ORDER — CEFAZOLIN SODIUM 1-5 GM-% IV SOLN
1.0000 g | Freq: Three times a day (TID) | INTRAVENOUS | Status: DC
  Filled 2014-04-16: qty 50

## 2014-04-16 MED ORDER — ACETAMINOPHEN 650 MG RE SUPP: 650.0000 mg | RECTAL | Status: DC | PRN

## 2014-04-16 MED ORDER — MORPHINE SULFATE 2 MG/ML IJ SOLN: 1.0000 mg | INTRAMUSCULAR | Status: DC | PRN

## 2014-04-16 MED ORDER — SODIUM CHLORIDE 0.9 % IV SOLN
INTRAVENOUS | Status: DC
  Administered 2014-04-16 – 2014-04-17 (×2): via INTRAVENOUS
  Administered 2014-04-17: 1000 mL via INTRAVENOUS
  Administered 2014-04-19: 07:00:00 via INTRAVENOUS

## 2014-04-16 MED ORDER — SODIUM CHLORIDE 0.9 % IJ SOLN: 3.0000 mL | INTRAMUSCULAR | Status: DC | PRN

## 2014-04-16 MED ORDER — MENTHOL 3 MG MT LOZG: 1.0000 | LOZENGE | OROMUCOSAL | Status: DC | PRN

## 2014-04-16 MED ORDER — SENNA 8.6 MG PO TABS
1.0000 | ORAL_TABLET | Freq: Two times a day (BID) | ORAL | Status: DC
  Administered 2014-04-16 – 2014-04-20 (×9): 8.6 mg via ORAL
  Filled 2014-04-16 (×9): qty 1

## 2014-04-16 MED ORDER — SODIUM CHLORIDE 0.9 % IV SOLN: 250.0000 mL | INTRAVENOUS | Status: DC

## 2014-04-16 MED ORDER — METHOCARBAMOL 1000 MG/10ML IJ SOLN: 500.0000 mg | Freq: Four times a day (QID) | INTRAVENOUS | Status: DC | PRN

## 2014-04-16 MED ORDER — SODIUM CHLORIDE 0.9 % IJ SOLN
3.0000 mL | Freq: Two times a day (BID) | INTRAMUSCULAR | Status: DC
  Administered 2014-04-16 – 2014-04-18 (×3): 3 mL via INTRAVENOUS

## 2014-04-16 MED ORDER — ALUM & MAG HYDROXIDE-SIMETH 200-200-20 MG/5ML PO SUSP: 30.0000 mL | Freq: Four times a day (QID) | ORAL | Status: DC | PRN

## 2014-04-16 MED ORDER — ACETAMINOPHEN 325 MG PO TABS: 650.0000 mg | ORAL_TABLET | ORAL | Status: DC | PRN

## 2014-04-16 MED ORDER — DOCUSATE SODIUM 100 MG PO CAPS
100.0000 mg | ORAL_CAPSULE | Freq: Two times a day (BID) | ORAL | Status: DC
  Administered 2014-04-16 – 2014-04-20 (×9): 100 mg via ORAL
  Filled 2014-04-16 (×9): qty 1

## 2014-04-16 MED ORDER — PHENOL 1.4 % MT LIQD: 1.0000 | OROMUCOSAL | Status: DC | PRN

## 2014-04-16 MED ORDER — BISACODYL 10 MG RE SUPP
10.0000 mg | Freq: Every day | RECTAL | Status: DC | PRN
  Administered 2014-04-17: 10 mg via RECTAL
  Filled 2014-04-16: qty 1

## 2014-04-16 NOTE — Consult Note (Signed)
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  Referring Physician:  Dr. Ellene Route   HPI: Carolyn [name] is a 44 y.o. 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 MRI thoracolumbar spine revealed pathologic compression fracture of T8 with retropulsed bone and epidural tumor resulting cord compression, significant tumor involvement T7 and T9 and widespread osseous disease throughout thoracic and lumbar spine. 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  She was evaluated by Dr. Ellene Route and underwent laminectomy T8 with decompression of cord and T6-T10 fixation on 04/04. 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   Dr. [name] consulted for input and CT chest done revealing tiny pulmonary nodules new since 05/2014 question early mets. She is to follow up with him in 2 weeks to likely initiate systemic therapy with anti-estrogens and cyclin inhibitors . TLSO ordered for support prior to mobilization. Patient continues to have BLE weakness and PT/OT evaluations done today. Patient will likely require intensive rehab to achieve prior level of independence and CIR recommended for follow up therapy.   Review of Systems  HENT: Negative for hearing loss.   Eyes: Negative for blurred vision and double vision.  Respiratory: Negative for cough, hemoptysis and shortness of breath.   Cardiovascular: Negative for chest pain and palpitations.  Gastrointestinal: Negative for heartburn and abdominal pain.  Genitourinary: Negative for urgency and frequency.  Musculoskeletal: Positive for back pain.  Neurological: Positive for focal  weakness. Negative for headaches.  Psychiatric/Behavioral: Negative for depression. The patient is not nervous/anxious.      Past Medical History  Diagnosis Date  . 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 Hx of radiation therapy 11/08/11 -12/26/11    right chest wall/supraclav fossa, right scar  . History of chemotherapy 06/27/11 -09/04/11    neoadjuvant    Past Surgical History  Procedure Laterality Date  . Wisdom tooth extraction    . Portacath placement  06/20/2011    Procedure: INSERTION PORT-A-CATH;  Surgeon: Haywood Lasso, MD;  Location: [facility];  Service: General;  Laterality: N/A;  . Modified mastectomy  10/03/2011    Procedure: MODIFIED MASTECTOMY;  Surgeon: Haywood Lasso, MD;  Location: [facility];  Service: General;  Laterality: Right;  . Port-a-cath removal  01/30/2012    Procedure: MINOR REMOVAL PORT-A-CATH;  Surgeon: Haywood Lasso, MD;  Location: [facility];  Service: General;  Laterality: Left;  . Breast biopsy      Left  . Robotic assisted total hysterectomy with bilateral salpingo oopherectomy Bilateral 12/23/2012    Procedure: ROBOTIC ASSISTED TOTAL HYSTERECTOMY WITH BILATERAL SALPINGO OOPHORECTOMY;  Surgeon: Marvene Staff, MD;  Location: [facility];  Service: Gynecology;  Laterality: Bilateral;  . Cholecystectomy N/A 03/01/2014    Procedure: LAPAROSCOPIC CHOLECYSTECTOMY;  Surgeon: Leighton Ruff, MD;  Location: [facility];  Service: General;  Laterality: N/A;  . Laminectomy N/A 04/13/2014    Procedure: THORACIC LAMINECTOMY WITH FIXATION THORACIC SIX-THORACIC TEN FUSION;  Surgeon: Kristeen Miss, MD;  Location: [facility];  Service: Neurosurgery;  Laterality: N/A;    Family History  Problem Relation Age of Onset  . Breast  cancer Maternal Aunt 38  . Pancreatic cancer Maternal Grandfather   . Pancreatic cancer Maternal Aunt     died in her 46s  . Leukemia Maternal Aunt     died in  her 73s  . Ovarian cancer Cousin 43    maternal cousin    Social History:  Single. Works in Art therapist at [employer]. Has a supportive family with sisters who plan on assisting past discharge. She reports that she has never smoked. She has never used smokeless tobacco. She reports that she does not drink alcohol or use illicit drugs.     Allergies  Allergen Reactions  . Allegra [Fexofenadine] Hives    Abdomen only  . Shellfish Allergy Hives    Abdomen only    Medications Prior to Admission  Medication Sig Dispense Refill  . Cholecalciferol (VITAMIN D3) 2000 UNITS TABS Take 2,000 Units by mouth daily.    Marland Kitchen gabapentin (NEURONTIN) 100 MG capsule Take 100 mg by mouth 3 (three) times daily.    . methocarbamol (ROBAXIN) 500 MG tablet Take 500 mg by mouth every 6 (six) hours as needed for muscle spasms.    . Multiple Vitamin (MULTIVITAMIN) tablet Take 1 tablet by mouth daily.    . tamoxifen (NOLVADEX) 20 MG tablet Take 1 tablet (20 mg total) by mouth daily. 90 tablet 3  . acetaminophen (TYLENOL) 325 MG tablet Take 2 tablets (650 mg total) by mouth every 6 (six) hours as needed for mild pain, moderate pain, fever or headache. (Patient not taking: Reported on 04/12/2014)    . HYDROcodone-acetaminophen (NORCO/VICODIN) 5-325 MG per tablet Take 1 tablet by mouth every 4 (four) hours as needed for moderate pain. (Patient not taking: Reported on 04/12/2014) 40 tablet 0  . ibuprofen (ADVIL,MOTRIN) 200 MG tablet You can take 2-3 tablets every 6 hours safely for pain. (Patient not taking: Reported on 04/12/2014)    . venlafaxine XR (EFFEXOR-XR) 37.5 MG 24 hr capsule Take 1 capsule (37.5 mg total) by mouth daily with breakfast. (Patient not taking: Reported on 04/12/2014) 30 capsule 3    Home: Home Living Family/patient expects to be discharged to:: Private residence Living Arrangements: Alone  Functional History:   Functional Status:  Mobility:          ADL:    Cognition: Cognition Orientation Level:  Oriented X4    Blood pressure 104/59, pulse 120, temperature 99.8 F (37.7 C), temperature source Oral, resp. rate 18, height 5\' 8"  (1.727 m), weight 58.5 kg (128 lb 15.5 oz), last menstrual period 07/02/2011, SpO2 99 %. Physical Exam  Nursing note and vitals reviewed. Constitutional: She is oriented to person, place, and time. She appears well-developed and well-nourished.  HENT:  Head: Normocephalic and atraumatic.  Eyes: Conjunctivae are normal. Pupils are equal, round, and reactive to light.  Neck: Normal range of motion. Neck supple.  Cardiovascular: Normal rate and regular rhythm.   Respiratory: Effort normal and breath sounds normal. No respiratory distress. She has no wheezes.  GI: Soft. Bowel sounds are normal. She exhibits no distension. There is no tenderness.  Musculoskeletal: She exhibits no edema or tenderness.  Back somewhat tender with bed mobility, general palpation, UE use  Neurological: She is alert and oriented to person, place, and time.  Speech clear. Follows commands without difficulty. RUE weakness proximally with decreased ROM at shoulder. BLE with proximal weakness and instability. Mild sensory deficits LLE, 1+/2 to LT,PP, proprioception. Strength 4/5 ue's prox to distal with some limitations due to pain. LE:  3+ hf, 4 ke and 4+ both ankles.  BLE with 3+ DTR.   Skin: Skin is warm and dry.  Psychiatric: She has a normal mood and affect. Her behavior is normal. Judgment and thought content normal.    No results found for this or any previous visit (from the past 24 hour(s)). Ct Chest W Contrast  04/15/2014   CLINICAL DATA:  Stage IV carcinoma of right breast. Mastectomy. Radiation therapy chemotherapy. Decompression of thoracic spine and fusion from T6-T10 yesterday. Secondary to epidural tumor.  EXAM: CT CHEST WITH CONTRAST  TECHNIQUE: Multidetector CT imaging of the chest was performed during intravenous contrast administration.  CONTRAST:  74mL OMNIPAQUE IOHEXOL 300  MG/ML  SOLN  COMPARISON:  Thoracic spine MR 04/12/2014.  Chest CT 06/08/2014.  FINDINGS: Mediastinum/Nodes: No supraclavicular adenopathy. Right mastectomy and axillary node dissection. No axillary adenopathy.  Normal heart size, without pericardial effusion. No mediastinal or hilar adenopathy. No internal mammary adenopathy.  Paravertebral soft tissue fullness on image 32 of series 201 at the site of pathologic fracture and epidural tumor.  Lungs/Pleura: Small bilateral pleural effusions. Right apical pleural parenchymal scarring which could be radiation induced.  3 mm right upper lobe pulmonary nodule on image 25 is new.  2 mm right lower lobe pulmonary nodule on image 29 is not identified on the prior exam.  Focus of ground-glass opacity in the left apex on image 15 is nonspecific.  Upper abdomen: 7 mm hypo attenuating right liver lobe lesion on image 53 was present on the prior exam and favored to represent a cyst. Too small to characterize lesions more inferiorly in the right lobe are unchanged. 2 too small to characterize lateral segment left liver lobe lesions which are not readily apparent on the prior exam.  Normal imaged portions of the spleen, stomach, adrenal glands, kidneys, pancreas. Cholecystectomy. Common duct is within normal limits, 9 mm.  Musculoskeletal: Postoperative edema and gas within the perivertebral space. Spine fixation at T6 through T10. The left-sided screws extend anterior to the vertebral bodies. Metastatic disease is identified throughout the thoracic spine, including in the right-sided T12 on sagittal images 63 and 64.  IMPRESSION: 1. Tiny pulmonary nodules, likely new since the prior exam. Cannot exclude early pulmonary metastasis. Recommend attention on follow-up. 2. Too small to characterize liver lesions. Some are new. Early metastatic disease cannot be excluded. If further imaging characterization is desired, pre and post contrast abdominal MRI may be informative. 3. Osseous  metastasis throughout the thoracic spine with T8 compression fracture and interval fixation at T6-10. 4. Small bilateral pleural effusions. 5. Nonspecific ground-glass opacity at the left apex. Correlate with infectious symptoms. Recommend attention on follow-up.   Electronically Signed   By: Abigail Miyamoto M.D.   On: 04/15/2014 10:29   Mr Jeri Cos UX Contrast  04/14/2014   CLINICAL DATA:  Metastatic breast cancer. Leg weakness from pathologic compression deformity and epidural tumor, with recent stabilization. Incidental brain edema discovered on spinal MR screening.  EXAM: MRI HEAD WITHOUT AND WITH CONTRAST  TECHNIQUE: Multiplanar, multiecho pulse sequences of the brain and surrounding structures were obtained without and with intravenous contrast.  CONTRAST:  MultiHance 12 mL.  COMPARISON:  Survey images from MRI thoracic spine 04/12/2014.  FINDINGS: There is no acute stroke or hemorrhage. No hydrocephalus or extra-axial fluid.  There is a 7 mm LEFT centrum semiovale brain metastasis, having reduced signal on T2 weighted images, no restriction of diffusion, and displaying mild susceptibility on gradient sequence. Avid postcontrast enhancement.  No other definite metastases.  In the LEFT medial mastoid portion of the temporal bone, there is an asymmetric T2 hyperintense lesion 9 x 14 mm cross-section which shows mild postcontrast enhancement. This would be an unusual location for metastatic disease but this is not excluded given the osseous disease elsewhere. Temporal bone inflammatory process is also possible. Recommend CT of the temporal bones with contrast for further evaluation.  Mild disease of the clivus is suspected in its superior aspect. This is questioned on precontrast T1 weighted images inferior and posterior to the sella. Heterogeneity of calvarial bone marrow suggest osseous diploic involvement is possible.  No sinus or orbital disease. Negative RIGHT temporal bone. Major dural venous sinuses are  patent.  IMPRESSION: 7 mm apparently solitary intra-axial intracranial metastasis with marked surrounding edema and mild susceptibility suggesting hemorrhage or calcification.  Unusual LEFT medial mastoid temporal bone enhancing lesion 9 x 14 mm cross-section. Consider CT temporal bone with contrast for further evaluation.  Metastatic disease to the clivus and calvarium not excluded. See discussion above.   Electronically Signed   By: Rolla Flatten M.D.   On: 04/14/2014 11:54   Nm Bone Scan Whole Body  04/15/2014   CLINICAL DATA:  History of breast cancer, stage IV.  EXAM: NUCLEAR MEDICINE WHOLE BODY BONE SCAN  TECHNIQUE: Whole body anterior and posterior images were obtained approximately 3 hours after intravenous injection of radiopharmaceutical.  RADIOPHARMACEUTICALS:  25 mCi Technetium-99 MDP  COMPARISON:  Chest CT 04/15/2014  FINDINGS: Soft tissue activity and urinary activity is present - no superscan. At the time of imaging, renal activity has largely washed out.  There is a mid thoracic spine photopenic area correlating with large T8 metastasis. Spinal metastases throughout the thoracic and lumbar spine that are known from recent MRI and CT are not clearly visible.  IMPRESSION: 1. Osseous metastatic disease known by CT and MRI is significantly underestimated by bone scan - likely due to predominantly lytic nature. 2. T8 photopenia related to large lytic metastasis and surgical defect.   Electronically Signed   By: Monte Fantasia M.D.   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 Does the need for close, 24 hr/day medical supervision in concert with the patient's rehab needs make it unreasonable for this patient to be served in a less intensive setting? Yes 2. Co-Morbidities requiring supervision/potential complications: breast cancer, pain control, nutrition 3. Due to bladder management, bowel  management, safety, skin/wound care, disease management, medication administration, pain management and patient education, does the patient require 24 hr/day rehab nursing? Yes 4. Does the patient require coordinated care of a physician, rehab nurse, PT (1-2 hrs/day, 5 days/week) and OT (1-2 hrs/day, 5 days/week) to address physical and functional deficits in the context of the above medical diagnosis(es)? Yes Addressing deficits in the following areas: balance, endurance, locomotion, strength, transferring, bowel/bladder control, bathing, dressing, feeding, grooming, toileting and psychosocial support 5. Can the patient actively participate in an intensive therapy program of at least 3 hrs of therapy per day at least 5 days per week? Yes 6. The potential for patient to make measurable gains while on inpatient rehab is excellent 7. Anticipated functional outcomes upon discharge from inpatient rehab are modified independent  with PT, modified independent and supervision with OT, n/a with SLP. 8. Estimated rehab length of stay to reach the above functional goals is: likely 7-10 days 9. Does the patient have adequate social supports and living environment  to accommodate these discharge functional goals? Yes 10. Anticipated D/C setting: Home 11. Anticipated post D/C treatments: HH therapy and Outpatient therapy 12. Overall Rehab/Functional Prognosis: excellent  RECOMMENDATIONS: This patient's condition is appropriate for continued rehabilitative care in the following setting: CIR Patient has agreed to participate in recommended program. Yes Note that insurance prior authorization may be required for reimbursement for recommended care.  Comment: Awaiting therapy assessments. Rehab Admissions Coordinator to follow up.  Thanks,  Meredith Staggers, MD, Mellody Drown     04/16/2014

## 2014-04-16 NOTE — Progress Notes (Signed)
Patient arrived to unit a 2100 alert and oriented.  Requested something for pain once settled in family member at bedside.  Pain med given will monitor for effectiveness.

## 2014-04-16 NOTE — Clinical Documentation Improvement (Signed)
Supporting Information: Anemia noted per 4/05 progress notes.   Labs: H/H: 4/03:  12.5/37.2. 4/05:   7.7/22.5. 4/07:   7.0/20.7.    Possible Clinical Condition: . Documentation of Anemia should include the type of anemia: --Nutritional --Hemolytic --Aplastic --Acute Blood Loss Anemia --Acute on Chronic Blood Loss Anemia --Other (please specify) . Include in documentation if Anemia is due to nutrition or mineral deficits, resulting in a nutritional anemia . Document any "cause-and-effect" relationship between the intervention and the blood or immune disorder . Document the specific drug if anemia is drug-induced . Link any laboratory findings to a related diagnosis (if appropriate) . Document any associated diagnoses/conditions     Thank Sherian Maroon Documentation Specialist [phone] [email or web address]

## 2014-04-16 NOTE — Evaluation (Signed)
Occupational Therapy Evaluation Patient Details Name: Carolyn [name] MRN: [id number] DOB: [birth year] Today's Date: 04/16/2014    History of Present Illness Patient is a 44 year old individual who's had the onset of weakness in her legs and unsteadiness of gait in the past 24-48 hours. She notes some periumbilical numbness may be a bit longer. She came into the hospital today because she was having difficulty walking. 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 There is high-grade cord compression and the central canal is narrowed to 4 mm in the AP diameter at T8.  Pt underwent T6-T10 fusion with T8 decompression.   Clinical Impression   Pt is currently min assist level for most selfcare tasks and functional transfers using the RW for safety.  Plans to discharge to her sisters house where PRN supervision is available.  Anticipate that she will make steady progress toward a supervision to modified independent level with CIR stay for increased intensity of therapy.  Will continue to follow in acute care for progression of independence.      Follow Up Recommendations  CIR    Equipment Recommendations  3 in 1 bedside comode       Precautions / Restrictions Precautions Precautions: Fall;Back Precaution Booklet Issued: Yes (comment) Required Braces or Orthoses: Spinal Brace Spinal Brace: Thoracolumbosacral orthotic;Applied in supine position Restrictions Other Position/Activity Restrictions: No order in chart for brace at this time but MD notes state she needs for mobility.      Mobility Bed Mobility Overal bed mobility: Needs Assistance Bed Mobility: Rolling;Sit to Supine Rolling: Min guard     Sit to supine: Min assist      Transfers Overall transfer level: Needs assistance Equipment used: Rolling walker (2 wheeled)             General transfer comment: Min  instructional cueing needed for hand placement with sit to stand.     Balance Overall balance assessment: Needs assistance   Sitting balance-Leahy Scale: Fair       Standing balance-Leahy Scale: Fair Standing balance comment: Pt able to stand with close supervision while engaged in washing her hands and performing oral hygiene.                            ADL Overall ADL's : Needs assistance/impaired Eating/Feeding: Independent;Sitting   Grooming: Wash/dry hands;Wash/dry face;Oral care;Minimal assistance;Standing   Upper Body Bathing: Supervision/ safety;Bed level   Lower Body Bathing: Minimal assistance;Sit to/from stand   Upper Body Dressing : Maximal assistance;Bed level Upper Body Dressing Details (indicate cue type and reason): For donning and doffing TLSO in supine Lower Body Dressing: Sit to/from stand;Minimal assistance;Adhering to back precautions   Toilet Transfer: Minimal assistance;Comfort height toilet;Ambulation;RW;Grab bars   Toileting- Clothing Manipulation and Hygiene: Minimal assistance;Adhering to back precautions       Functional mobility during ADLs: Minimal assistance;Rolling walker General ADL Comments: Pt educated and provided handout on back precautions.  She was able to bring up her LEs in sitting without difficulty to donn and doff her gripper socks.  Educated her on grooming tasks in standing following back precautions.  Discussed the need for a 3:1, shower bench or chair, and reacher at discharge.      Vision Vision Assessment?: No apparent visual deficits   Perception Perception Perception Tested?: No   Praxis Praxis Praxis tested?: Not tested  Pertinent Vitals/Pain Pain Assessment: 0-10 Pain Score: 7  Pain Location: middle back Pain Intervention(s): Monitored during session;Repositioned     Hand Dominance Right   Extremity/Trunk Assessment Upper Extremity Assessment Upper Extremity Assessment: Overall WFL for tasks  assessed (Not formally assessed secondary to back precautions but grip strength WFLs bilaterally)   Lower Extremity Assessment Lower Extremity Assessment: Defer to PT evaluation   Cervical / Trunk Assessment Cervical / Trunk Assessment: Normal   Communication Communication Communication: No difficulties   Cognition Arousal/Alertness: Awake/alert Behavior During Therapy: WFL for tasks assessed/performed                                  Home Living Family/patient expects to be discharged to:: Private residence Living Arrangements: Alone Available Help at Discharge: Family;Available PRN/intermittently (Going to sister and nephews house at discharge) Type of Home: Apartment Home Access: Stairs to enter [employer] of Steps: 6-7 Entrance Stairs-Rails: Right;Left Home Layout: One level     Bathroom Shower/Tub: Tub/shower unit Shower/tub characteristics: Architectural technologist: Standard     Home Equipment: None          Prior Functioning/Environment Level of Independence: Independent             OT Diagnosis: Generalized weakness;Acute pain   OT Problem List: Decreased strength;Impaired balance (sitting and/or standing);Decreased knowledge of use of DME or AE;Pain;Decreased knowledge of precautions   OT Treatment/Interventions: Self-care/ADL training;Balance training;Energy conservation;DME and/or AE instruction;Neuromuscular education;Patient/family education;Therapeutic activities    OT Goals(Current goals can be found in the care plan section) Acute Rehab OT Goals Patient Stated Goal: To get back her independence OT Goal Formulation: With patient Time For Goal Achievement: 04/23/14 Potential to Achieve Goals: Good  OT Frequency: Min 3X/week              End of Session Equipment Utilized During Treatment: Gait belt;Rolling walker Nurse Communication: Mobility status  Activity Tolerance: Patient tolerated treatment well Patient  left: in bed;with call bell/phone within reach;with family/visitor present   Time: 7622-6333 OT Time Calculation (min): 46 min Charges:  OT General Charges $OT Visit: 1 Procedure OT Evaluation $Initial OT Evaluation Tier I: 1 Procedure OT Treatments $Self Care/Home Management : 38-52 mins Royston Bekele OTR/L 04/16/2014, 4:49 PM

## 2014-04-16 NOTE — Progress Notes (Signed)
Rehab admissions - Evaluated for possible admission.  I met with patient.  She would like inpatient rehab prior to going home with her sister and nephew.  She has state [employer].  I will begin precert process.  However, currently rehab beds are full.  I will follow up daily for bed availability and insurance decisions.  Call me for questions.  #[phone]

## 2014-04-16 NOTE — Evaluation (Signed)
Physical Therapy Evaluation Patient Details Name: Carolyn [name] MRN: [id number] DOB: 05/20/1970 Today's Date: 04/16/2014   History of Present Illness  Patient is a 44 year old individual who's had the onset of weakness in her legs and unsteadiness of gait in the past 24-48 hours. She notes some periumbilical numbness may be a bit longer. She came into the hospital today because she was having difficulty walking. 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 There is high-grade cord compression and the central canal is narrowed to 4 mm in the AP diameter at T8.  Pt underwent T6-T10 fusion with T8 decompression.  Clinical Impression  Patient demonstrates deficits in functional mobility as indicated below. Will need continued skilled PT to address deficits and maximize function. Will see as indicated and progress as tolerated. At this time, highly recommend CIR for further rehabilitation.     Follow Up Recommendations CIR    Equipment Recommendations  Rolling walker with 5" wheels;3in1 (PT)    Recommendations for Other Services Rehab consult     Precautions / Restrictions Precautions Precautions: Fall;Back Precaution Booklet Issued: Yes (comment) Required Braces or Orthoses: Spinal Brace Spinal Brace: Thoracolumbosacral orthotic;Applied in supine position Restrictions Weight Bearing Restrictions: No Other Position/Activity Restrictions: No order in chart for brace at this time but MD notes state she needs for mobility.      Mobility  Bed Mobility Overal bed mobility: Needs Assistance Bed Mobility: Rolling;Sidelying to Sit Rolling: Min guard Sidelying to sit: Min assist   Sit to supine: Min assist   General bed mobility comments: VCs for positioning, rollin gin bed to donn brace (clam shell) assist to elevate trunk to EOB  Transfers Overall transfer level: Needs  assistance Equipment used: Rolling walker (2 wheeled) Transfers: Sit to/from Stand Sit to Stand: Min assist         General transfer comment: VCs for hand placement with transfer, assist to power up to standing  Ambulation/Gait Ambulation/Gait assistance: Min assist Ambulation Distance (Feet): 40 Feet Assistive device: Rolling walker (2 wheeled) Gait Pattern/deviations: Step-to pattern;Decreased stride length;Steppage;Ataxic Gait velocity: decreased   General Gait Details: uncoordinated gait with  increased steppage left side  Stairs            Wheelchair Mobility    Modified Rankin (Stroke Patients Only)       Balance Overall balance assessment: Needs assistance   Sitting balance-Leahy Scale: Fair       Standing balance-Leahy Scale: Fair Standing balance comment: Pt able to stand with close supervision while engaged in washing her hands and performing oral hygiene.                             Pertinent Vitals/Pain Pain Assessment: 0-10 Pain Score: 8  Pain Location: incisional site Pain Descriptors / Indicators: Aching Pain Intervention(s): Monitored during session;Repositioned;RN gave pain meds during session;Relaxation    Home Living Family/patient expects to be discharged to:: Private residence Living Arrangements: Alone Available Help at Discharge: Family;Available PRN/intermittently (Going to sister and nephews house at discharge) Type of Home: Apartment Home Access: Stairs to enter Entrance Stairs-Rails: Psychiatric nurse of Steps: 6-7 Home Layout: One level Home Equipment: None      Prior Function Level of Independence: Independent               Hand Dominance   Dominant Hand:  Right    Extremity/Trunk Assessment   Upper Extremity Assessment: Overall WFL for tasks assessed           Lower Extremity Assessment: RLE deficits/detail;LLE deficits/detail RLE Deficits / Details: noted weakness 4/5 gross  motions LLE Deficits / Details: noted weakness 4/5 gross motions  Cervical / Trunk Assessment: Normal  Communication   Communication: No difficulties  Cognition Arousal/Alertness: Awake/alert Behavior During Therapy: WFL for tasks assessed/performed Overall Cognitive Status: Within Functional Limits for tasks assessed                      General Comments      Exercises        Assessment/Plan    PT Assessment Patient needs continued PT services  PT Diagnosis Difficulty walking;Abnormality of gait;Acute pain   PT Problem List Decreased strength;Decreased range of motion;Decreased activity tolerance;Decreased balance;Decreased mobility;Decreased coordination;Impaired sensation;Pain  PT Treatment Interventions DME instruction;Gait training;Stair training;Functional mobility training;Therapeutic activities;Therapeutic exercise;Balance training;Patient/family education   PT Goals (Current goals can be found in the Care Plan section) Acute Rehab PT Goals Patient Stated Goal: To get back her independence PT Goal Formulation: With patient Time For Goal Achievement: 04/30/14 Potential to Achieve Goals: Good    Frequency Min 3X/week   Barriers to discharge        Co-evaluation               End of Session Equipment Utilized During Treatment: Gait belt;Back brace Activity Tolerance: Patient limited by fatigue Patient left: in chair;with call bell/phone within reach Nurse Communication: Mobility status;Precautions         Time: 1749-4496 PT Time Calculation (min) (ACUTE ONLY): 22 min   Charges:   PT Evaluation $Initial PT Evaluation Tier I: 1 Procedure     PT G CodesDuncan Dull 2014-04-21, 5:31 PM Alben Deeds, [place] DPT  [phone]

## 2014-04-16 NOTE — Progress Notes (Signed)
Overall doing well today. States that her legs feel better than preop. Patient able to void spontaneously.  Afebrile. Vitals are stable. Urine output good. Hemoglobin down to 7.0. Recheck in a.m. Currently not symptomatic and I would hold off on transfusion if possible. Awake and alert. Oriented and appropriate. Motor examination 4+ over 5 bilateral lower extremities. Some residual ataxia but definitely improved from preop. Wound clean and dry.  Overall progressing well. Continue efforts at therapy.

## 2014-04-17 LAB — TYPE AND SCREEN
ABO/RH(D): B POS
Antibody Screen: NEGATIVE
Unit division: 0
Unit division: 0

## 2014-04-17 LAB — CBC WITH DIFFERENTIAL/PLATELET
Basophils Absolute: 0 10*3/uL (ref 0.0–0.1)
Basophils Relative: 0 % (ref 0–1)
EOS ABS: 0.1 10*3/uL (ref 0.0–0.7)
Eosinophils Relative: 1 % (ref 0–5)
HCT: 18.3 % — ABNORMAL LOW (ref 36.0–46.0)
Hemoglobin: 6.4 g/dL — CL (ref 12.0–15.0)
Lymphocytes Relative: 26 % (ref 12–46)
Lymphs Abs: 1.3 10*3/uL (ref 0.7–4.0)
MCH: 30.6 pg (ref 26.0–34.0)
MCHC: 35 g/dL (ref 30.0–36.0)
MCV: 87.6 fL (ref 78.0–100.0)
MONOS PCT: 6 % (ref 3–12)
Monocytes Absolute: 0.3 10*3/uL (ref 0.1–1.0)
Neutro Abs: 3.3 10*3/uL (ref 1.7–7.7)
Neutrophils Relative %: 66 % (ref 43–77)
Platelets: 125 10*3/uL — ABNORMAL LOW (ref 150–400)
RBC: 2.09 MIL/uL — AB (ref 3.87–5.11)
RDW: 12.7 % (ref 11.5–15.5)
WBC: 5 10*3/uL (ref 4.0–10.5)

## 2014-04-17 LAB — PREPARE RBC (CROSSMATCH)

## 2014-04-17 MED ORDER — ZOLPIDEM TARTRATE 5 MG PO TABS
5.0000 mg | ORAL_TABLET | Freq: Every evening | ORAL | Status: DC | PRN
  Administered 2014-04-19: 5 mg via ORAL
  Filled 2014-04-17: qty 1

## 2014-04-17 MED ORDER — SODIUM CHLORIDE 0.9 % IV SOLN
Freq: Once | INTRAVENOUS | Status: AC
  Administered 2014-04-17: 15:00:00 via INTRAVENOUS

## 2014-04-17 MED ORDER — DIAZEPAM 5 MG PO TABS
5.0000 mg | ORAL_TABLET | Freq: Four times a day (QID) | ORAL | Status: DC | PRN
  Administered 2014-04-18 – 2014-04-19 (×2): 5 mg via ORAL
  Filled 2014-04-17 (×2): qty 1

## 2014-04-17 MED ORDER — DIAZEPAM 5 MG/ML IJ SOLN
5.0000 mg | Freq: Four times a day (QID) | INTRAMUSCULAR | Status: DC | PRN
  Administered 2014-04-17: 5 mg via INTRAVENOUS
  Filled 2014-04-17: qty 2

## 2014-04-17 NOTE — Progress Notes (Signed)
UR complete.  Zahlia Deshazer RN, MSN 

## 2014-04-17 NOTE — Progress Notes (Signed)
Rehab admissions - I have sent information to [facility] requesting inpatient rehab admission.  However, I do not anticipate rehab bed available for the next several days.  Recommend pursuit of inpatient rehab at another facility in case our rehab here at [facility] remains full.  Call me for questions.  #[phone]

## 2014-04-17 NOTE — Plan of Care (Signed)
Problem: Consults Goal: Diagnosis - Spinal Surgery Outcome: Completed/Met Date Met:  04/17/14 Microdiscectomy     

## 2014-04-17 NOTE — Progress Notes (Signed)
Overall doing well. Pain very well controlled. Lower extremity function continues to improve but she still has some mild weakness and ataxia. Wound healing well.  Main issue today is that of following up her CBC. Patient's hemoglobin yesterday was 7. If lower today then I discussed with the patient giving her 2 units of blood. Overall doing well otherwise. Acceptable for transfer to rehabilitation. Source of anemia is blood loss from surgery.

## 2014-04-17 NOTE — Progress Notes (Signed)
CRITICAL VALUE ALERT  Critical value received: Hg -6.4  Date of notification: 04/17/14  Time of notification: 1123 Critical value read back:Yes.    Nurse who received alert:  Margarita Sermons RN   MD notified (1st page):  Trenton Gammon   Time of first page:  1127  MD notified (2nd page): n/a  Time of second page: n/a  Responding MD: Trenton Gammon  Time MD responded:  [phone]

## 2014-04-18 LAB — TYPE AND SCREEN
ABO/RH(D): B POS
Antibody Screen: NEGATIVE
UNIT DIVISION: 0
Unit division: 0

## 2014-04-18 NOTE — Progress Notes (Signed)
Patient ID: Carolyn [name], female   DOB: Oct 18, 1970, 44 y.o.   MRN: [id number] Subjective:  The patient is alert and pleasant and in no apparent distress. She looks well.  Objective: Vital signs in last 24 hours: Temp:  [98 F (36.7 C)-99.5 F (37.5 C)] 98.6 F (37 C) (04/09 0557) Pulse Rate:  [76-104] 83 (04/09 0557) Resp:  [16-20] 18 (04/09 0557) BP: (94-126)/(54-73) 98/62 mmHg (04/09 0557) SpO2:  [98 %-100 %] 100 % (04/09 0557)  Intake/Output from previous day: 04/08 0701 - 04/09 0700 In: 2509.5 [P.O.:1377; I.V.:647.5; Blood:485] Out: 1000 [Urine:1000] Intake/Output this shift:    Physical exam patient is alert and pleasant. She is moving all 4 extremities well.  Lab Results:  Recent Labs  04/16/14 0840 04/17/14 0940  WBC 9.2 5.0  HGB 7.0* 6.4*  HCT 20.7* 18.3*  PLT 106* 125*   BMET  Recent Labs  04/16/14 0840  NA 137  K 3.4*  CL 101  CO2 27  GLUCOSE 185*  BUN 7  CREATININE 0.75  CALCIUM 7.9*    Studies/Results: No results found.  Assessment/Plan: Postop day #6: The patient is doing well clinically and we are awaiting rehabilitation.  Acute blood loss anemia: A posttransfusion CBC is pending.  LOS: 6 days     Ovidio Steele D 04/18/2014, 9:18 AM

## 2014-04-19 LAB — CBC WITH DIFFERENTIAL/PLATELET
BASOS ABS: 0 10*3/uL (ref 0.0–0.1)
BASOS PCT: 0 % (ref 0–1)
Eosinophils Absolute: 0.2 10*3/uL (ref 0.0–0.7)
Eosinophils Relative: 3 % (ref 0–5)
HCT: 29.3 % — ABNORMAL LOW (ref 36.0–46.0)
Hemoglobin: 9.7 g/dL — ABNORMAL LOW (ref 12.0–15.0)
LYMPHS ABS: 1.6 10*3/uL (ref 0.7–4.0)
LYMPHS PCT: 26 % (ref 12–46)
MCH: 28.2 pg (ref 26.0–34.0)
MCHC: 33.1 g/dL (ref 30.0–36.0)
MCV: 85.2 fL (ref 78.0–100.0)
Monocytes Absolute: 0.5 10*3/uL (ref 0.1–1.0)
Monocytes Relative: 8 % (ref 3–12)
NEUTROS ABS: 3.8 10*3/uL (ref 1.7–7.7)
Neutrophils Relative %: 63 % (ref 43–77)
Platelets: 171 10*3/uL (ref 150–400)
RBC: 3.44 MIL/uL — AB (ref 3.87–5.11)
RDW: 15.3 % (ref 11.5–15.5)
WBC: 6 10*3/uL (ref 4.0–10.5)

## 2014-04-19 LAB — POCT I-STAT 4, (NA,K, GLUC, HGB,HCT)
GLUCOSE: 98 mg/dL (ref 70–99)
Glucose, Bld: >700 mg/dL (ref 70–99)
HCT: 24 % — ABNORMAL LOW (ref 36.0–46.0)
HEMATOCRIT: 10 % — AB (ref 36.0–46.0)
HEMOGLOBIN: 8.2 g/dL — AB (ref 12.0–15.0)
Hemoglobin: 3.4 g/dL — CL (ref 12.0–15.0)
POTASSIUM: 3.2 mmol/L — AB (ref 3.5–5.1)
Potassium: 4.3 mmol/L (ref 3.5–5.1)
Sodium: 143 mmol/L (ref 135–145)
Sodium: 111 mmol/L — CL (ref 135–145)

## 2014-04-19 LAB — POCT I-STAT 7, (LYTES, BLD GAS, ICA,H+H)
Acid-base deficit: 1 mmol/L (ref 0.0–2.0)
BICARBONATE: 25 meq/L — AB (ref 20.0–24.0)
CALCIUM ION: 1.19 mmol/L (ref 1.12–1.23)
HCT: 24 % — ABNORMAL LOW (ref 36.0–46.0)
Hemoglobin: 8.2 g/dL — ABNORMAL LOW (ref 12.0–15.0)
O2 SAT: 99 %
PH ART: 7.351 (ref 7.350–7.450)
POTASSIUM: 3.7 mmol/L (ref 3.5–5.1)
Sodium: 142 mmol/L (ref 135–145)
TCO2: 26 mmol/L (ref 0–100)
pCO2 arterial: 45.1 mmHg — ABNORMAL HIGH (ref 35.0–45.0)
pO2, Arterial: 144 mmHg — ABNORMAL HIGH (ref 80.0–100.0)

## 2014-04-19 NOTE — Progress Notes (Signed)
Occupational Therapy Treatment Patient Details Name: Carolyn [name] MRN: [id number] DOB: [birth year] Today's Date: 04/19/2014    History of present illness Patient is a 44 year old individual who's had the onset of weakness in her legs and unsteadiness of gait in the past 24-48 hours. She notes some periumbilical numbness may be a bit longer. She came into the hospital today because she was having difficulty walking. 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 There is high-grade cord compression and the central canal is narrowed to 4 mm in the AP diameter at T8.  Pt underwent T6-T10 fusion with T8 decompression.   OT comments  Pt reports ambulating to bathroom with assist routinely today and managed to walk to door earlier with nursing staff.  Focus of session on instruction in back precautions related to ADL, standing ADL and toileting.  Pt in good spirits. Reports LEs feeling wobbly, but no buckling noted. Continues to have good potential to perform at a modified independent level with rehab.  Follow Up Recommendations  CIR    Equipment Recommendations  3 in 1 bedside comode    Recommendations for Other Services      Precautions / Restrictions Precautions Precautions: Fall;Back Precaution Comments: educated in back precautions related to ADL Required Braces or Orthoses: Spinal Brace Spinal Brace: Thoracolumbosacral orthotic;Applied in supine position Restrictions Weight Bearing Restrictions: No       Mobility Bed Mobility               General bed mobility comments: pt up at EOB with brace  Transfers Overall transfer level: Needs assistance Equipment used: Rolling walker (2 wheeled) Transfers: Sit to/from Stand Sit to Stand: Min assist         General transfer comment: VCs for hand placement with transfer, assist to rise from surfaces without arms     Balance             Standing balance-Leahy Scale: Fair Standing balance comment: able to release walker in standing to manage pants and perform standing grooming                   ADL Overall ADL's : Needs assistance/impaired     Grooming: Wash/dry hands;Wash/dry face;Oral care;Standing;Min guard               Lower Body Dressing: Set up;Minimal assistance;Sit to/from stand Lower Body Dressing Details (indicate cue type and reason): donned socks with set up, min assist for dressing in standing Toilet Transfer: Minimal assistance;Comfort height toilet;Ambulation;Grab bars;RW   Toileting- Clothing Manipulation and Hygiene: Minimal assistance;Sit to/from stand Toileting - Clothing Manipulation Details (indicate cue type and reason): instructed in availability of toilet tongs for pericare to avoid twisting     Functional mobility during ADLs: Minimal assistance;Rolling walker General ADL Comments: Instructed pt on benefits of seated showering for safety and option of using a resin chair.      Vision                     Perception     Praxis      Cognition   Behavior During Therapy: [facility] for tasks assessed/performed Overall Cognitive Status: Within Functional Limits for tasks assessed                       Extremity/Trunk Assessment  Exercises     Shoulder Instructions       General Comments      Pertinent Vitals/ Pain       Pain Assessment: Faces Faces Pain Scale: Hurts little more Pain Location: back Pain Descriptors / Indicators: Grimacing;Guarding Pain Intervention(s): Monitored during session;Repositioned  Home Living                                          Prior Functioning/Environment              Frequency Min 3X/week     Progress Toward Goals  OT Goals(current goals can now be found in the care plan section)  Progress towards OT goals: Progressing toward goals  Acute  Rehab OT Goals Patient Stated Goal: To get back her independence  Plan Discharge plan remains appropriate    Co-evaluation                 End of Session Equipment Utilized During Treatment: Gait belt;Rolling walker;Back brace   Activity Tolerance Patient tolerated treatment well   Patient Left in chair;with call bell/phone within reach;with family/visitor present   Nurse Communication          Time: 1445-1501 OT Time Calculation (min): 16 min  Charges: OT General Charges $OT Visit: 1 Procedure OT Treatments $Self Care/Home Management : 8-22 mins  Malka So 04/19/2014, 3:25 PM  [phone]

## 2014-04-19 NOTE — Progress Notes (Signed)
Subjective: Patient reports she is doing well. She describes her pain as a 7 out of 10 but she is smiling and happy. She wants to walk in the hallways.  Objective: Vital signs in last 24 hours: Temp:  [98.4 F (36.9 C)-98.7 F (37.1 C)] 98.5 F (36.9 C) (04/10 0907) Pulse Rate:  [78-104] 104 (04/10 0907) Resp:  [16-20] 16 (04/10 0907) BP: (81-107)/(54-64) 81/54 mmHg (04/10 0907) SpO2:  [97 %-100 %] 97 % (04/10 0907)  Intake/Output from previous day:   Intake/Output this shift:    She is awake and alert and pleasant. She looks very comfortable. She is moving her extremities well. She is sitting in a chair in her TLSO brace  Lab Results: Lab Results  Component Value Date   WBC 5.0 04/17/2014   HGB 6.4* 04/17/2014   HCT 18.3* 04/17/2014   MCV 87.6 04/17/2014   PLT 125* 04/17/2014   No results found for: INR, PROTIME BMET Lab Results  Component Value Date   NA 137 04/16/2014   K 3.4* 04/16/2014   CL 101 04/16/2014   CO2 27 04/16/2014   GLUCOSE 185* 04/16/2014   BUN 7 04/16/2014   CREATININE 0.75 04/16/2014   CALCIUM 7.9* 04/16/2014    Studies/Results: No results found.  Assessment/Plan: Overall stable and making progress. Awaiting rehabilitation.   LOS: 7 days    JONES,DAVID S 04/19/2014, 11:55 AM

## 2014-04-20 ENCOUNTER — Other Ambulatory Visit: Payer: Self-pay | Admitting: Radiation Therapy

## 2014-04-20 ENCOUNTER — Encounter: Payer: Self-pay | Admitting: Radiation Oncology

## 2014-04-20 ENCOUNTER — Inpatient Hospital Stay (HOSPITAL_COMMUNITY)
Admission: RE | Admit: 2014-04-20 | Discharge: 2014-04-25 | DRG: 542 | Disposition: A | Discharge status: Home Health | Payer: BC Managed Care – PPO | Source: Intra-hospital | Attending: Physical Medicine & Rehabilitation | Admitting: Physical Medicine & Rehabilitation

## 2014-04-20 ENCOUNTER — Telehealth: Payer: Self-pay | Admitting: Oncology

## 2014-04-20 ENCOUNTER — Other Ambulatory Visit: Payer: Self-pay | Admitting: Oncology

## 2014-04-20 ENCOUNTER — Ambulatory Visit: Payer: BC Managed Care – PPO | Attending: Radiation Oncology | Admitting: Radiation Oncology

## 2014-04-20 DIAGNOSIS — C50919 Malignant neoplasm of unspecified site of unspecified female breast: Secondary | ICD-10-CM | POA: Insufficient documentation

## 2014-04-20 DIAGNOSIS — E876 Hypokalemia: Secondary | ICD-10-CM | POA: Diagnosis not present

## 2014-04-20 DIAGNOSIS — D62 Acute posthemorrhagic anemia: Secondary | ICD-10-CM

## 2014-04-20 DIAGNOSIS — K5909 Other constipation: Secondary | ICD-10-CM

## 2014-04-20 DIAGNOSIS — Z853 Personal history of malignant neoplasm of breast: Secondary | ICD-10-CM

## 2014-04-20 DIAGNOSIS — Z510 Encounter for antineoplastic radiation therapy: Secondary | ICD-10-CM | POA: Diagnosis present

## 2014-04-20 DIAGNOSIS — F419 Anxiety disorder, unspecified: Secondary | ICD-10-CM | POA: Diagnosis not present

## 2014-04-20 DIAGNOSIS — Z9221 Personal history of antineoplastic chemotherapy: Secondary | ICD-10-CM

## 2014-04-20 DIAGNOSIS — G959 Disease of spinal cord, unspecified: Secondary | ICD-10-CM | POA: Diagnosis present

## 2014-04-20 DIAGNOSIS — Z79899 Other long term (current) drug therapy: Secondary | ICD-10-CM | POA: Diagnosis not present

## 2014-04-20 DIAGNOSIS — M8448XS Pathological fracture, other site, sequela: Secondary | ICD-10-CM | POA: Diagnosis not present

## 2014-04-20 DIAGNOSIS — C7951 Secondary malignant neoplasm of bone: Secondary | ICD-10-CM | POA: Diagnosis not present

## 2014-04-20 DIAGNOSIS — C50411 Malignant neoplasm of upper-outer quadrant of right female breast: Secondary | ICD-10-CM | POA: Diagnosis present

## 2014-04-20 DIAGNOSIS — G936 Cerebral edema: Secondary | ICD-10-CM | POA: Diagnosis not present

## 2014-04-20 DIAGNOSIS — Z923 Personal history of irradiation: Secondary | ICD-10-CM

## 2014-04-20 DIAGNOSIS — M8448XA Pathological fracture, other site, initial encounter for fracture: Secondary | ICD-10-CM | POA: Diagnosis present

## 2014-04-20 DIAGNOSIS — G992 Myelopathy in diseases classified elsewhere: Secondary | ICD-10-CM | POA: Diagnosis not present

## 2014-04-20 DIAGNOSIS — Z170 Estrogen receptor positive status [ER+]: Secondary | ICD-10-CM

## 2014-04-20 DIAGNOSIS — C7931 Secondary malignant neoplasm of brain: Secondary | ICD-10-CM | POA: Diagnosis not present

## 2014-04-20 DIAGNOSIS — T402X5A Adverse effect of other opioids, initial encounter: Secondary | ICD-10-CM | POA: Diagnosis not present

## 2014-04-20 DIAGNOSIS — M79609 Pain in unspecified limb: Secondary | ICD-10-CM | POA: Diagnosis not present

## 2014-04-20 MED ORDER — FLEET ENEMA 7-19 GM/118ML RE ENEM: 1.0000 | ENEMA | Freq: Once | RECTAL | Status: AC | PRN

## 2014-04-20 MED ORDER — TRAZODONE HCL 50 MG PO TABS: 25.0000 mg | ORAL_TABLET | Freq: Every evening | ORAL | Status: DC | PRN

## 2014-04-20 MED ORDER — PHENOL 1.4 % MT LIQD
1.0000 | OROMUCOSAL | Status: DC | PRN
  Filled 2014-04-20: qty 177

## 2014-04-20 MED ORDER — GUAIFENESIN-DM 100-10 MG/5ML PO SYRP: 5.0000 mL | ORAL_SOLUTION | Freq: Four times a day (QID) | ORAL | Status: DC | PRN

## 2014-04-20 MED ORDER — ALPRAZOLAM 0.25 MG PO TABS
0.2500 mg | ORAL_TABLET | Freq: Two times a day (BID) | ORAL | Status: DC | PRN
  Administered 2014-04-22 – 2014-04-24 (×2): 0.25 mg via ORAL
  Filled 2014-04-20 (×2): qty 1

## 2014-04-20 MED ORDER — OXYCODONE HCL 5 MG PO TABS
5.0000 mg | ORAL_TABLET | ORAL | Status: DC | PRN
  Administered 2014-04-20 – 2014-04-21 (×4): 10 mg via ORAL
  Administered 2014-04-22: 5 mg via ORAL
  Administered 2014-04-22: 10 mg via ORAL
  Administered 2014-04-23: 5 mg via ORAL
  Administered 2014-04-23: 10 mg via ORAL
  Administered 2014-04-24: 5 mg via ORAL
  Administered 2014-04-24 (×2): 10 mg via ORAL
  Filled 2014-04-20 (×4): qty 2
  Filled 2014-04-20: qty 1
  Filled 2014-04-20 (×6): qty 2

## 2014-04-20 MED ORDER — OXYCODONE HCL ER 10 MG PO T12A
10.0000 mg | EXTENDED_RELEASE_TABLET | Freq: Every day | ORAL | Status: DC
  Administered 2014-04-20 – 2014-04-24 (×5): 10 mg via ORAL
  Filled 2014-04-20 (×5): qty 1

## 2014-04-20 MED ORDER — ALUM & MAG HYDROXIDE-SIMETH 200-200-20 MG/5ML PO SUSP: 30.0000 mL | Freq: Four times a day (QID) | ORAL | Status: DC | PRN

## 2014-04-20 MED ORDER — METHOCARBAMOL 1000 MG/10ML IJ SOLN: 500.0000 mg | Freq: Four times a day (QID) | INTRAVENOUS | Status: DC | PRN

## 2014-04-20 MED ORDER — ALUM & MAG HYDROXIDE-SIMETH 200-200-20 MG/5ML PO SUSP: 30.0000 mL | ORAL | Status: DC | PRN

## 2014-04-20 MED ORDER — BISACODYL 10 MG RE SUPP: 10.0000 mg | Freq: Every day | RECTAL | Status: DC | PRN

## 2014-04-20 MED ORDER — METHOCARBAMOL 500 MG PO TABS
500.0000 mg | ORAL_TABLET | Freq: Four times a day (QID) | ORAL | Status: DC | PRN
  Administered 2014-04-20 – 2014-04-24 (×2): 500 mg via ORAL
  Filled 2014-04-20 (×2): qty 1

## 2014-04-20 MED ORDER — MENTHOL 3 MG MT LOZG
1.0000 | LOZENGE | OROMUCOSAL | Status: DC | PRN
  Filled 2014-04-20: qty 9

## 2014-04-20 MED ORDER — ACETAMINOPHEN 325 MG PO TABS: 325.0000 mg | ORAL_TABLET | ORAL | Status: DC | PRN

## 2014-04-20 MED ORDER — POLYSACCHARIDE IRON COMPLEX 150 MG PO CAPS
150.0000 mg | ORAL_CAPSULE | Freq: Every day | ORAL | Status: DC
  Administered 2014-04-21 – 2014-04-25 (×5): 150 mg via ORAL
  Filled 2014-04-20 (×5): qty 1

## 2014-04-20 MED ORDER — ENSURE ENLIVE PO LIQD: 237.0000 mL | ORAL | Status: DC

## 2014-04-20 MED ORDER — SENNOSIDES-DOCUSATE SODIUM 8.6-50 MG PO TABS
2.0000 | ORAL_TABLET | Freq: Every day | ORAL | Status: DC
  Administered 2014-04-20 – 2014-04-24 (×5): 2 via ORAL
  Filled 2014-04-20 (×5): qty 2

## 2014-04-20 MED ORDER — DIPHENHYDRAMINE HCL 12.5 MG/5ML PO ELIX: 12.5000 mg | ORAL_SOLUTION | Freq: Four times a day (QID) | ORAL | Status: DC | PRN

## 2014-04-20 MED ORDER — ADULT MULTIVITAMIN W/MINERALS CH: 1.0000 | ORAL_TABLET | Freq: Every day | ORAL | Status: DC

## 2014-04-20 NOTE — Progress Notes (Signed)
Rehab admissions - I spoke with [employer] case manager this am.  I have faxed updated clinicals requesting acute inpatient rehab admission for today.  I will follow up as soon as I hear back from insurance carrier.  I am hopeful for admission to inpatient rehab later today.  Call me for questions.  #[phone]

## 2014-04-20 NOTE — Discharge Summary (Signed)
Physician Discharge Summary  Patient ID: Carolyn [name] MRN: [id number] DOB/AGE: [birth year] 44 y.o.  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 History of breast cancer. Metastatic cancer to brain. Acute blood loss anemia.  Active Problems:   Myelopathy   Pathologic fracture of thoracic vertebrae   Malnutrition of moderate degree   Discharged Condition: good  Hospital Course: Patient was admitted from the emergency department having developed significant weakness in her lower extremities she had some modest back pain and an MRI demonstrated a presence of compression fractures of T8 with metastatic cancer in T7 T9 and some lumbar vertebrae. She had a history of breast cancer treated about 18 months ago. She had been doing fairly well clinically otherwise.  She is taken to the operating room where she underwent surgical decompression and stabilization from T6-T10. Her leg strength improved however she still had some gait ataxia. An MRI of the brain was performed for further staging and this demonstrates lesions in the skull and in the left side of the hemisphere. These are small. Is also noted that she was quite anemic secondary to acute blood loss anemia from her surgery. The metastatic cancer and her spine was quite hemorrhagic. She is making a good recovery from her neurologic deficits however she will require acute inpatient hospitalization for rehabilitation. She will also require further adjuvant therapy. This will be done as an outpatient.  Consults: Medical oncology, radiation oncology, rehabilitation medicine  Significant Diagnostic Studies: MRI brain MRI T-spine MRI L-spine CT chest abdomen pelvis  Treatments: surgery:  Thoracic laminectomy and decompression of T8  Discharge Exam: Blood pressure 98/58, pulse 92, temperature 98.4 F (36.9 C), temperature source Oral, resp. rate 21, height 5\' 8"  (1.727 m), weight 58.5 kg (128 lb 15.5 oz), last menstrual period 07/02/2011, SpO2 100 %. Incision is clean and dry in her lumbar spine. Gait demonstrates a mildly wide-based ataxia. Strength is graded at 4 out of 5 with some spasticity in the lower extremities. Hyperreflexia with clonus is noted in the left leg not so much in the right leg  Disposition: 01-Home or Self Care  Discharge Instructions    Diet - low sodium heart healthy    Complete by:  As directed      Diet - low sodium heart healthy    Complete by:  As directed      Increase activity slowly    Complete by:  As directed      Increase activity slowly    Complete by:  As directed      No dressing needed    Complete by:  As directed             Medication List    TAKE these medications        acetaminophen 325 MG tablet  Commonly known as:  TYLENOL  Take 2 tablets (650 mg total) by mouth every 6 (six) hours as needed for mild pain, moderate pain, fever or headache.     gabapentin 100 MG capsule  Commonly known as:  NEURONTIN  Take 100 mg by mouth 3 (three) times daily.     HYDROcodone-acetaminophen 5-325 MG per tablet  Commonly known as:  NORCO/VICODIN  Take 1 tablet by mouth every 4 (four) hours as needed for moderate pain.  ibuprofen 200 MG tablet  Commonly known as:  ADVIL,MOTRIN  You can take 2-3 tablets every 6 hours safely for pain.     methocarbamol 500 MG tablet  Commonly known as:  ROBAXIN  Take 500 mg by mouth every 6 (six) hours as needed for muscle spasms.     multivitamin tablet  Take 1 tablet by mouth daily.     tamoxifen 20 MG tablet  Commonly known as:  NOLVADEX  Take 1 tablet (20 mg total) by mouth daily.     venlafaxine XR 37.5 MG 24 hr capsule  Commonly known as:  EFFEXOR-XR  Take 1 capsule (37.5 mg  total) by mouth daily with breakfast.     Vitamin D3 2000 UNITS Tabs  Take 2,000 Units by mouth daily.         SignedEarleen Newport 04/20/2014, 1:05 PM

## 2014-04-20 NOTE — Progress Notes (Signed)
[name], RN Rehab Admission Coordinator Signed Physical Medicine and Rehabilitation PMR Pre-admission 04/20/2014 11:03 AM  Related encounter: ED to Hosp-Admission (Discharged) from 04/12/2014 in [place]   PMR Admission Coordinator Pre-Admission Assessment  Patient: Carolyn [name] is an 44 y.o., female MRN: [id number] DOB: [birth year] Height: 5\' 8"  (172.7 cm) Weight: 58.5 kg (128 lb 15.5 oz)  Insurance Information HMO: PPO: Yes PCP: IPA: 80/20: OTHER: Blue options PRIMARY: BCBS state health plan Policy#: [id number] Subscriber: [name] [place] Name: Malachy Mood Phone#: [phone] Fax#: [phone] Pre-Cert#: [id number] precert for 7 days initially Employer: [employer] Benefits: Phone #: [phone] Name: Automated Eff. Date: 01/09/14 Deduct: $700 (met all) Out of Pocket Max: $3210 (met $2677.65) Life Max: None CIR: $233 copay per admit SNF: 80% with 100 days max Outpatient: No visit limit Co-Pay: $52/visit Home Health: 80% Co-Pay: 20% DME: 80% Co-Pay: 20% Providers: in network  Emergency Contact Information Contact Information    Name Relation Home Work 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 MRI thoracolumbar spine revealed pathologic compression fracture  of T8 with retropulsed bone and epidural tumor resulting cord compression, significant tumor involvement T7 and T9 and widespread osseous disease throughout thoracic and lumbar spine. 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 She was evaluated by Dr. Ellene Route and underwent laminectomy T8 with decompression of cord and T6-T10 fixation on 04/04. 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 Dr. [name] consulted for input and CT chest done revealing tiny pulmonary nodules new since 05/2014 question early mets. She is to follow up with him in 2 weeks to likely initiate systemic therapy with anti-estrogens and cyclin inhibitors . TLSO ordered for support prior to mobilization. She was noted to have ABLA with hgb down to 6.4 and was transfused with 2 units PRBC. To follow up with Radiation Onc on 04/13 for XRT simulation. Patient continues to have BLE weakness with ataxic gait and requires assistance with ADL tasks. CIR recommended for follow up therapy and patient admitted today.   Past Medical History  Past Medical History  Diagnosis Date  . 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 Hx of radiation therapy 11/08/11 -12/26/11    right chest wall/supraclav fossa, right scar  . History of chemotherapy 06/27/11 -09/04/11    neoadjuvant    Family History  family history includes Breast cancer (age of onset: 36) in her maternal aunt; Leukemia in her maternal aunt; Ovarian cancer (age of onset: 67) in her cousin; Pancreatic cancer in her maternal aunt and maternal grandfather.  Prior Rehab/Hospitalizations: Had outpatient PT 2013 after breast cancer on [address].  Current Medications   Current facility-administered medications:  . 0.9 % sodium chloride infusion, 250 mL, Intravenous, Continuous, Kristeen Miss, MD . 0.9 % sodium chloride infusion, , Intravenous, Continuous, Kristeen Miss, MD, Last Rate: 75 mL/hr at 04/19/14 0640 . acetaminophen (TYLENOL) tablet 650 mg, 650 mg, Oral, Q4H PRN **OR**  acetaminophen (TYLENOL) suppository 650 mg, 650 mg, Rectal, Q4H PRN, Kristeen Miss, MD . alum & mag hydroxide-simeth (MAALOX/MYLANTA) 200-200-20 MG/5ML suspension 30 mL, 30 mL, Oral, Q6H PRN, Kristeen Miss, MD . bisacodyl (DULCOLAX) suppository 10 mg, 10 mg, Rectal, Daily PRN, Kristeen Miss, MD, 10 mg at 04/17/14 0027 . diazepam (VALIUM) tablet 5 mg, 5 mg, Oral, Q6H PRN, Kristeen Miss, MD, 5 mg at 04/19/14 0656 . docusate sodium (COLACE) capsule 100 mg, 100 mg, Oral, BID, Kristeen Miss, MD, 100 mg at 04/20/14 6761 . feeding supplement (ENSURE ENLIVE) (ENSURE ENLIVE) liquid 237 mL, 237 mL, Oral, BID BM, Ardeen Garland, RD, 237 mL at 04/20/14 1106 . HYDROcodone-acetaminophen (NORCO/VICODIN) 5-325 MG per tablet 1 tablet, 1 tablet, Oral, Q4H PRN, Kristeen Miss, MD, 1 tablet at 04/13/14 2348 . magnesium hydroxide (MILK OF MAGNESIA) suspension 30 mL, 30 mL, Oral, Q4H PRN, Kristeen Miss, MD . menthol-cetylpyridinium (CEPACOL) lozenge 3 mg, 1 lozenge, Oral, PRN **OR** phenol (CHLORASEPTIC) mouth spray 1 spray, 1 spray, Mouth/Throat, PRN, Kristeen Miss, MD . methocarbamol (ROBAXIN) tablet 500 mg, 500 mg, Oral, Q6H PRN, 500 mg at 04/20/14 1106 **OR** methocarbamol (ROBAXIN) 500 mg in dextrose 5 % 50 mL IVPB, 500 mg, Intravenous, Q6H PRN, Kristeen Miss, MD . morphine 2 MG/ML injection 1-4 mg, 1-4 mg, Intravenous, Q3H PRN, Kristeen Miss, MD . ondansetron [facility]) injection 4 mg, 4 mg, Intravenous, Q4H PRN, Kristeen Miss, MD, 4 mg at 04/17/14 1023 . oxyCODONE-acetaminophen (PERCOCET/ROXICET) 5-325 MG per tablet 1-2 tablet, 1-2 tablet, Oral, Q4H PRN, Kristeen Miss, MD, 1 tablet at 04/20/14 1106 . polyethylene glycol (MIRALAX / GLYCOLAX) packet 17 g, 17 g, Oral, Daily PRN, Kristeen Miss, MD, 17 g at 04/16/14 1957 .  senna (SENOKOT) tablet 8.6 mg, 1 tablet, Oral, BID, Kristeen Miss, MD, 8.6 mg at 04/20/14 9509 . sodium chloride 0.9 % injection 3 mL, 3 mL, Intravenous, Q12H, Kristeen Miss, MD, 3 mL at 04/18/14 2200 . sodium chloride 0.9 % injection 3 mL, 3 mL, Intravenous, PRN, Kristeen Miss, MD . zolpidem [facility]) tablet 5 mg, 5 mg, Oral, QHS PRN, Newman Pies, MD, 5 mg at 04/19/14 2202  Patients Current Diet: Diet regular Room service appropriate?: Yes; Fluid consistency:: Thin  Precautions / Restrictions Precautions Precautions: Fall, Back Precaution Booklet Issued: Yes (comment) Precaution Comments: educated in back precautions related to ADL Spinal Brace: Thoracolumbosacral orthotic, Applied in supine position Restrictions Weight Bearing Restrictions: No Other Position/Activity Restrictions: No order in chart for brace at this time but MD notes state she needs for mobility.   Prior Activity Level Community (5-7x/wk): Went out daily. worked FT at [employer]. Not driving. Takes the bus for transportation.   Home Assistive Devices / Equipment Home Assistive Devices/Equipment: None Home Equipment: None  Prior Functional Level Prior Function Level of Independence: Independent  Current Functional Level Cognition  Overall Cognitive Status: Within Functional Limits for tasks assessed Orientation Level: Oriented X4   Extremity Assessment (includes Sensation/Coordination)  Upper Extremity Assessment: Overall WFL for tasks assessed  Lower Extremity Assessment: RLE deficits/detail, LLE deficits/detail RLE Deficits / Details: noted weakness 4/5 gross motions RLE Coordination: decreased fine motor, decreased gross motor LLE Deficits / Details: noted weakness 4/5 gross motions LLE Coordination: decreased fine motor, decreased gross motor    ADLs  Overall ADL's : Needs assistance/impaired Eating/Feeding: Independent, Sitting Grooming: Wash/dry hands, Wash/dry face, Oral care,  Standing, Min guard Upper Body Bathing: Supervision/ safety, Bed level Lower Body Bathing: Minimal assistance, Sit to/from stand Upper Body Dressing : Maximal assistance, Bed  level Upper Body Dressing Details (indicate cue type and reason): For donning and doffing TLSO in supine Lower Body Dressing: Set up, Minimal assistance, Sit to/from stand Lower Body Dressing Details (indicate cue type and reason): donned socks with set up, min assist for dressing in standing Toilet Transfer: Minimal assistance, Comfort height toilet, Ambulation, Grab bars, RW Toileting- Clothing Manipulation and Hygiene: Minimal assistance, Sit to/from stand Toileting - Clothing Manipulation Details (indicate cue type and reason): instructed in availability of toilet tongs for pericare to avoid twisting Functional mobility during ADLs: Minimal assistance, Rolling walker General ADL Comments: Instructed pt on benefits of seated showering for safety and option of using a resin chair.    Mobility  Overal bed mobility: Needs Assistance Bed Mobility: Rolling, Sidelying to Sit Rolling: Min guard Sidelying to sit: Min assist Sit to supine: Min assist General bed mobility comments: VCs and assist for donning brace, assist to elevate trunk to EOB    Transfers  Overall transfer level: Needs assistance Equipment used: Rolling walker (2 wheeled) Transfers: Sit to/from Stand Sit to Stand: Min assist General transfer comment: Performed x3, VCs for hand placement initially, assist for elevation to standing during power up to get hands to RW    Ambulation / Gait / Stairs / Wheelchair Mobility  Ambulation/Gait Ambulation/Gait assistance: Mod assist (Min assist with RW, Moderate assist without device) Ambulation Distance (Feet): 90 Feet (90 ft x1 with RW, 60 ft x2 without RW) Assistive device: Rolling walker (2 wheeled), 1 person hand held assist Gait Pattern/deviations: Step-to pattern, Decreased stride length,  Steppage, Ataxic Gait velocity: decreased General Gait Details: Patient improving with use of RW but continues to demonstrate instability with turns and increased distance while using RW. During mobility without device, patient required moderate assist for stability with multiple LOB noted. hand held assist provided. Patient with difficulty coordinating gait (scissoring and ataxic) without device. (2 steaded rest breaks between gait training trials)    Posture / Balance Balance Overall balance assessment: Needs assistance Sitting balance-Leahy Scale: Fair Standing balance-Leahy Scale: Fair Standing balance comment: able to release walker in standing to manage pants and perform standing grooming    Special needs/care consideration BiPAP/CPAP No CPM No Continuous Drip IV No Dialysis No  Life Vest No Oxygen No Special Bed No Trach Size No Wound Vac (area) No  Skin Thoracic surgical incision with dressing  Bowel mgmt: Last BM 04/19/14 Bladder mgmt: Voiding WDL Diabetic mgmt No    Previous Home Environment Living Arrangements: Alone Available Help at Discharge: Family, Available PRN/intermittently (Going to sister and nephews house at discharge) Type of Home: Apartment Home Layout: One level Home Access: Stairs to enter Entrance Stairs-Rails: Right, Left Entrance Stairs-Number of Steps: 6-7 Bathroom Shower/Tub: Chiropodist: Standard Home Care Services: No  Discharge Living Setting Plans for Discharge Living Setting: Lives with (comment), Apartment (Plans to go home with sister and nephew.) Type of Home at Discharge: Apartment (Apartment is on the 2nd level.) Discharge Home Layout: One level Discharge Home Access: Stairs to enter Entrance Stairs-Number of Steps: 6-7 steps Does the patient have any problems obtaining your medications?: No  Social/Family/Support Systems Patient Roles: Other (Comment) (Has a sister  and a nephew.) Contact Information: Katherine Basset - sister Anticipated Caregiver: Katherine Basset, self and nephew Anticipated Caregiver's Contact Information: Maudie Mercury - (h) [phone] (c) [phone] Ability/Limitations of Caregiver: Sister works 6 am to 4 pm; nephew works PT. Caregiver Availability: Intermittent Discharge Plan Discussed with Primary Caregiver: Yes Is Caregiver In Agreement  with Plan?: Yes Does Caregiver/Family have Issues with Lodging/Transportation while Pt is in Rehab?: No  Goals/Additional Needs Patient/Family Goal for Rehab: PT mod I, OT mod I to supervision goals Expected length of stay: 7-10 days Cultural Considerations: Attends [facility] Dietary Needs: Regular diet Equipment Needs: TBD Pt/Family Agrees to Admission and willing to participate: Yes Program Orientation Provided & Reviewed with Pt/Caregiver Including Roles & Responsibilities: Yes  Decrease burden of Care through IP rehab admission: N/A  Possible need for SNF placement upon discharge: Not anticipated  Patient Condition: This patient's medical and functional status has changed since the consult dated: 04/16/14 in which the Rehabilitation Physician determined and documented that the patient's condition is appropriate for intensive rehabilitative care in an inpatient rehabilitation facility. See "History of Present Illness" (above) for medical update. Functional changes are: Currently requiring min/mod assist to ambulate 90 ft RW. Patient's medical and functional status update has been discussed with the Rehabilitation physician and patient remains appropriate for inpatient rehabilitation. Will admit to inpatient rehab today.  Preadmission Screen Completed By: [name], 04/20/2014 12:14 PM ______________________________________________________________________  Discussed status with Dr. Naaman Plummer on 04/20/14 at 1214 and received telephone approval for admission today.  Admission Coordinator:  [name], time1214/Date04/11/16          Cosigned by: Meredith Staggers, MD at 04/20/2014 1:41 PM  Revision History     Date/Time User Provider Type Action   04/20/2014 1:41 PM Meredith Staggers, MD Physician Cosign   04/20/2014 12:14 PM [name], RN Rehab Admission Coordinator Sign

## 2014-04-20 NOTE — Progress Notes (Signed)
NUTRITION FOLLOW UP  Intervention:   Continue Ensure Enlive po once daily, each supplement provides 350 kcal and 20 grams of protein Provide Magic Cup ice cream, provides 290 kcal and 9 grams of protein Provide Multivitamin with minerals daily  Nutrition Dx:   Malnutrition related to chronic illness as evidenced by mild/moderate depletion of fat and muscle; ongoing  Goal:   Pt to meet >/= 90% of their estimated nutrition needs; unmet  Monitor:   Diet advancement, PO intake, weight trends, labs  Assessment:   Pt with hx of metastatic breast cancer, s/p surgery, XRT, and chemo. Pt admitted with weakness, pt found to have T8 cord compression secondary to metastatic pathologic fracture of T8 involvement with tumor at T7 and T9.  S/P thoracic laminectomy with fixation thoracic six-thoracic ten fusion on 04/13/14  Pt states that her appetite is good/normal but, she doesn't eat much; she may eat oatmeal and yogurt for breakfast and half a sandwich with fruit for lunch; she has been trying to eat more eggs, nuts, and dairy for protein since she doesn't really like meat. She has been drinking half or one bottle of Ensure Enlive daily. Her weight has dropped 4 lbs within the past 6 weeks. RD encouraged PO intake. Pt agreeable to continuing Ensure Enlive and trying Magic cup ice cream.   Labs: low hemoglobin  Height: Ht Readings from Last 1 Encounters:  04/13/14 5\' 8"  (1.727 m)    Weight Status:   Wt Readings from Last 1 Encounters:  04/13/14 128 lb 15.5 oz (58.5 kg)    Re-estimated needs:  Kcal: 1750-2000 Protein: 80-90 grams Fluid: > 1.7 L/day  Skin: closed incision on back  Diet Order: Diet regular Room service appropriate?: Yes; Fluid consistency:: Thin   Intake/Output Summary (Last 24 hours) at 04/20/14 1234 Last data filed at 04/20/14 1142  Gross per 24 hour  Intake    240 ml  Output      2 ml  Net    238 ml    Last BM: 4/10   Labs:   Recent Labs Lab  04/13/14 1429 04/13/14 1608 04/13/14 1638 04/16/14 0840  NA 143 111* 142 137  K 4.3 3.2* 3.7 3.4*  CL  --   --   --  101  CO2  --   --   --  27  BUN  --   --   --  7  CREATININE  --   --   --  0.75  CALCIUM  --   --   --  7.9*  GLUCOSE 98 >700*  --  185*    CBG (last 3)  No results for input(s): GLUCAP in the last 72 hours.  Scheduled Meds: . docusate sodium  100 mg Oral BID  . feeding supplement (ENSURE ENLIVE)  237 mL Oral BID BM  . senna  1 tablet Oral BID  . sodium chloride  3 mL Intravenous Q12H    Continuous Infusions: . sodium chloride    . sodium chloride 75 mL/hr at 04/19/14 [place], LDN Inpatient Clinical Dietitian Pager: [phone] After Hours Pager: [phone]

## 2014-04-20 NOTE — Progress Notes (Signed)
Physical Therapy Treatment Patient Details Name: Carolyn [name] MRN: [id number] DOB: 1970/08/08 Today's Date: 04/20/2014    History of Present Illness Patient is a 44 year old individual who's had the onset of weakness in her legs and unsteadiness of gait in the past 24-48 hours. She notes some periumbilical numbness may be a bit longer. She came into the hospital today because she was having difficulty walking. 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 There is high-grade cord compression and the central canal is narrowed to 4 mm in the AP diameter at T8.  Pt underwent T6-T10 fusion with T8 decompression.    PT Comments    Patient making steady progress towards PT goals. At this time patient able to ambulate with one person assist using device.  Attempted trials of ambulation without device, patient required increased moderate assist with several incidence of loss of balance secondary to gait deviations and LE weakness. Prior to admission patient was independent with mobility without device. At this time continue to recommend comprehensive therapies to address mobility and functional deficits and maximize chance of return to independence. Will see as indicated and progress as tolerated.    Follow Up Recommendations  CIR     Equipment Recommendations  Rolling walker with 5" wheels;3in1 (PT)    Recommendations for Other Services Rehab consult     Precautions / Restrictions Precautions Precautions: Fall;Back Precaution Booklet Issued: Yes (comment) Required Braces or Orthoses: Spinal Brace Spinal Brace: Thoracolumbosacral orthotic;Applied in supine position Restrictions Weight Bearing Restrictions: No Other Position/Activity Restrictions: No order in chart for brace at this time but MD notes state she needs for mobility.    Mobility  Bed Mobility Overal bed mobility: Needs  Assistance Bed Mobility: Rolling;Sidelying to Sit Rolling: Min guard Sidelying to sit: Min assist       General bed mobility comments: VCs and assist for donning brace, assist to elevate trunk to EOB  Transfers Overall transfer level: Needs assistance Equipment used: Rolling walker (2 wheeled) Transfers: Sit to/from Stand Sit to Stand: Min assist         General transfer comment: Performed x3, VCs for hand placement initially, assist for elevation to standing during power up to get hands to RW  Ambulation/Gait Ambulation/Gait assistance: Mod assist (Min assist with RW, Moderate assist without device) Ambulation Distance (Feet): 90 Feet (90 ft x1 with RW, 60 ft x2 without RW) Assistive device: Rolling walker (2 wheeled);1 person hand held assist Gait Pattern/deviations: Step-to pattern;Decreased stride length;Steppage;Ataxic Gait velocity: decreased   General Gait Details: Patient improving with use of RW but continues to demonstrate instability with turns and increased distance while using RW.  During mobility without device, patient required moderate assist for stability with multiple LOB noted. hand held assist provided. Patient with difficulty coordinating gait (scissoring and ataxic) without device.  (2 steaded rest breaks between gait training trials)   Stairs            Wheelchair Mobility    Modified Rankin (Stroke Patients Only)       Balance     Sitting balance-Leahy Scale: Fair       Standing balance-Leahy Scale: Fair                      Cognition Arousal/Alertness: Awake/alert Behavior During Therapy: WFL for tasks assessed/performed Overall Cognitive Status: Within Functional Limits for tasks assessed  Exercises      General Comments        Pertinent Vitals/Pain Pain Assessment: 0-10 Pain Score: 6  Pain Location: back Pain Descriptors / Indicators: Aching;Discomfort Pain Intervention(s): Monitored  during session;Repositioned;Relaxation    Home Living                      Prior Function            PT Goals (current goals can now be found in the care plan section) Acute Rehab PT Goals Patient Stated Goal: To get back her independence PT Goal Formulation: With patient Time For Goal Achievement: 04/30/14 Potential to Achieve Goals: Good Progress towards PT goals: Progressing toward goals    Frequency  Min 3X/week    PT Plan Current plan remains appropriate    Co-evaluation             End of Session Equipment Utilized During Treatment: Gait belt;Back brace Activity Tolerance: Patient tolerated treatment well; No increase in pain Patient left: in chair;with call bell/phone within reach     Time: 1007-1031 PT Time Calculation (min) (ACUTE ONLY): 24 min  Charges:  $Gait Training: 8-22 mins $Therapeutic Activity: 8-22 mins                    G CodesDuncan Dull 2014/05/04, 10:33 AM Alben Deeds, PT DPT  [phone]

## 2014-04-20 NOTE — Telephone Encounter (Signed)
per pof to sch pt appt-gave pt copy of sch °

## 2014-04-20 NOTE — Progress Notes (Signed)
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  Referring Physician: Dr. Ellene Route   HPI: Carolyn [name] is a 44 y.o. 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 MRI thoracolumbar spine revealed pathologic compression fracture of T8 with retropulsed bone and epidural tumor resulting cord compression, significant tumor involvement T7 and T9 and widespread osseous disease throughout thoracic and lumbar spine. 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 She was evaluated by Dr. Ellene Route and underwent laminectomy T8 with decompression of cord and T6-T10 fixation on 04/04. 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   Dr. [name] consulted for input and CT chest done revealing tiny pulmonary nodules new since 05/2014 question early mets. She is to follow up with him in 2 weeks to likely initiate systemic therapy with anti-estrogens and cyclin inhibitors . TLSO ordered for support prior to mobilization. Patient continues to have BLE weakness and PT/OT evaluations done today. Patient will likely require intensive rehab to achieve prior level of independence and CIR recommended for follow up therapy.   Review of Systems  HENT: Negative for hearing loss.  Eyes: Negative for blurred vision and double vision.  Respiratory: Negative for cough, hemoptysis and shortness of breath.    Cardiovascular: Negative for chest pain and palpitations.  Gastrointestinal: Negative for heartburn and abdominal pain.  Genitourinary: Negative for urgency and frequency.  Musculoskeletal: Positive for back pain.  Neurological: Positive for focal weakness. Negative for headaches.  Psychiatric/Behavioral: Negative for depression. The patient is not nervous/anxious.     Past Medical History  Diagnosis Date  . 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 Hx of radiation therapy 11/08/11 -12/26/11    right chest wall/supraclav fossa, right scar  . History of chemotherapy 06/27/11 -09/04/11    neoadjuvant    Past Surgical History  Procedure Laterality Date  . Wisdom tooth extraction    . Portacath placement  06/20/2011    Procedure: INSERTION PORT-A-CATH; Surgeon: Haywood Lasso, MD; Location: [place]; Service: General; Laterality: N/A;  . Modified mastectomy  10/03/2011    Procedure: MODIFIED MASTECTOMY; Surgeon: Haywood Lasso, MD; Location: [place]; Service: General; Laterality: Right;  . Port-a-cath removal  01/30/2012    Procedure: MINOR REMOVAL PORT-A-CATH; Surgeon: Haywood Lasso, MD; Location: [place]; Service: General; Laterality: Left;  . Breast biopsy      Left  . Robotic assisted total hysterectomy with bilateral salpingo oopherectomy Bilateral 12/23/2012    Procedure: ROBOTIC ASSISTED TOTAL HYSTERECTOMY WITH BILATERAL SALPINGO OOPHORECTOMY; Surgeon: Marvene Staff, MD; Location: [facility]; Service: Gynecology; Laterality: Bilateral;  . Cholecystectomy N/A 03/01/2014    Procedure: LAPAROSCOPIC CHOLECYSTECTOMY; Surgeon: Leighton Ruff, MD; Location: [facility]; Service: General; Laterality: N/A;  . Laminectomy N/A 04/13/2014    Procedure: THORACIC LAMINECTOMY WITH FIXATION THORACIC SIX-THORACIC  TEN FUSION; Surgeon: Kristeen Miss, MD; Location: [facility]; Service: Neurosurgery; Laterality: N/A;  Family History  Problem Relation Age of Onset  . Breast cancer Maternal Aunt 76  . Pancreatic cancer Maternal Grandfather   . Pancreatic cancer Maternal Aunt     died in her 88s  . Leukemia Maternal Aunt     died in her 28s  . Ovarian cancer Cousin 50    maternal cousin    Social History: Single. Works in Art therapist at [employer]. Has a supportive family with sisters who plan on assisting past discharge. She reports that she has never smoked. She has never used smokeless tobacco. She reports that she does not drink alcohol or use illicit drugs.     Allergies  Allergen Reactions  . Allegra [Fexofenadine] Hives    Abdomen only  . Shellfish Allergy Hives    Abdomen only    Medications Prior to Admission  Medication Sig Dispense Refill  . Cholecalciferol (VITAMIN D3) 2000 UNITS TABS Take 2,000 Units by mouth daily.    Marland Kitchen gabapentin (NEURONTIN) 100 MG capsule Take 100 mg by mouth 3 (three) times daily.    . methocarbamol (ROBAXIN) 500 MG tablet Take 500 mg by mouth every 6 (six) hours as needed for muscle spasms.    . Multiple Vitamin (MULTIVITAMIN) tablet Take 1 tablet by mouth daily.    . tamoxifen (NOLVADEX) 20 MG tablet Take 1 tablet (20 mg total) by mouth daily. 90 tablet 3  . acetaminophen (TYLENOL) 325 MG tablet Take 2 tablets (650 mg total) by mouth every 6 (six) hours as needed for mild pain, moderate pain, fever or headache. (Patient not taking: Reported on 04/12/2014)    . HYDROcodone-acetaminophen (NORCO/VICODIN) 5-325 MG per tablet Take 1 tablet by mouth every 4 (four) hours as needed for moderate pain. (Patient not taking: Reported on 04/12/2014) 40 tablet 0  . ibuprofen (ADVIL,MOTRIN) 200 MG tablet You can take 2-3 tablets every 6 hours safely for pain. (Patient not taking:  Reported on 04/12/2014)    . venlafaxine XR (EFFEXOR-XR) 37.5 MG 24 hr capsule Take 1 capsule (37.5 mg total) by mouth daily with breakfast. (Patient not taking: Reported on 04/12/2014) 30 capsule 3    Home: Home Living Family/patient expects to be discharged to:: Private residence Living Arrangements: Alone  Functional History:   Functional Status:  Mobility:          ADL:    Cognition: Cognition Orientation Level: Oriented X4    Blood pressure 104/59, pulse 120, temperature 99.8 F (37.7 C), temperature source Oral, resp. rate 18, height 5\' 8"  (1.727 m), weight 58.5 kg (128 lb 15.5 oz), last menstrual period 07/02/2011, SpO2 99 %. Physical Exam  Nursing note and vitals reviewed. Constitutional: She is oriented to person, place, and time. She appears well-developed and well-nourished.  HENT:  Head: Normocephalic and atraumatic.  Eyes: Conjunctivae are normal. Pupils are equal, round, and reactive to light.  Neck: Normal range of motion. Neck supple.  Cardiovascular: Normal rate and regular rhythm.  Respiratory: Effort normal and breath sounds normal. No respiratory distress. She has no wheezes.  GI: Soft. Bowel sounds are normal. She exhibits no distension. There is no tenderness.  Musculoskeletal: She exhibits no edema or tenderness.  Back somewhat tender with bed mobility, general palpation, UE use  Neurological: She is alert and oriented to person, place, and time.  Speech clear. Follows commands without difficulty. RUE weakness proximally with decreased ROM at shoulder. BLE with proximal weakness and instability. Mild sensory deficits LLE, 1+/2 to LT,PP, proprioception. Strength 4/5 ue's prox to  distal with some limitations due to pain. LE: 3+ hf, 4 ke and 4+ both ankles. BLE with 3+ DTR.  Skin: Skin is warm and dry.  Psychiatric: She has a normal mood and affect. Her behavior is normal. Judgment and thought content normal.     Lab Results Last 24 Hours     No results found for this or any previous visit (from the past 24 hour(s)).    Imaging Results (Last 48 hours)    Ct Chest W Contrast  04/15/2014 CLINICAL DATA: Stage IV carcinoma of right breast. Mastectomy. Radiation therapy chemotherapy. Decompression of thoracic spine and fusion from T6-T10 yesterday. Secondary to epidural tumor. EXAM: CT CHEST WITH CONTRAST TECHNIQUE: Multidetector CT imaging of the chest was performed during intravenous contrast administration. CONTRAST: 83mL OMNIPAQUE IOHEXOL 300 MG/ML SOLN COMPARISON: Thoracic spine MR 04/12/2014. Chest CT 06/08/2014. FINDINGS: Mediastinum/Nodes: No supraclavicular adenopathy. Right mastectomy and axillary node dissection. No axillary adenopathy. Normal heart size, without pericardial effusion. No mediastinal or hilar adenopathy. No internal mammary adenopathy. Paravertebral soft tissue fullness on image 32 of series 201 at the site of pathologic fracture and epidural tumor. Lungs/Pleura: Small bilateral pleural effusions. Right apical pleural parenchymal scarring which could be radiation induced. 3 mm right upper lobe pulmonary nodule on image 25 is new. 2 mm right lower lobe pulmonary nodule on image 29 is not identified on the prior exam. Focus of ground-glass opacity in the left apex on image 15 is nonspecific. Upper abdomen: 7 mm hypo attenuating right liver lobe lesion on image 53 was present on the prior exam and favored to represent a cyst. Too small to characterize lesions more inferiorly in the right lobe are unchanged. 2 too small to characterize lateral segment left liver lobe lesions which are not readily apparent on the prior exam. Normal imaged portions of the spleen, stomach, adrenal glands, kidneys, pancreas. Cholecystectomy. Common duct is within normal limits, 9 mm. Musculoskeletal: Postoperative edema and gas within the perivertebral space. Spine fixation at T6 through T10. The left-sided screws extend anterior to  the vertebral bodies. Metastatic disease is identified throughout the thoracic spine, including in the right-sided T12 on sagittal images 63 and 64. IMPRESSION: 1. Tiny pulmonary nodules, likely new since the prior exam. Cannot exclude early pulmonary metastasis. Recommend attention on follow-up. 2. Too small to characterize liver lesions. Some are new. Early metastatic disease cannot be excluded. If further imaging characterization is desired, pre and post contrast abdominal MRI may be informative. 3. Osseous metastasis throughout the thoracic spine with T8 compression fracture and interval fixation at T6-10. 4. Small bilateral pleural effusions. 5. Nonspecific ground-glass opacity at the left apex. Correlate with infectious symptoms. Recommend attention on follow-up.  Electronically Signed By: Abigail Miyamoto M.D. On: 04/15/2014 10:29   Mr Jeri Cos AJ Contrast  04/14/2014 CLINICAL DATA: Metastatic breast cancer. Leg weakness from pathologic compression deformity and epidural tumor, with recent stabilization. Incidental brain edema discovered on spinal MR screening. EXAM: MRI HEAD WITHOUT AND WITH CONTRAST TECHNIQUE: Multiplanar, multiecho pulse sequences of the brain and surrounding structures were obtained without and with intravenous contrast. CONTRAST: MultiHance 12 mL. COMPARISON: Survey images from MRI thoracic spine 04/12/2014. FINDINGS: There is no acute stroke or hemorrhage. No hydrocephalus or extra-axial fluid. There is a 7 mm LEFT centrum semiovale brain metastasis, having reduced signal on T2 weighted images, no restriction of diffusion, and displaying mild susceptibility on gradient sequence. Avid postcontrast enhancement. No other definite metastases. In the LEFT medial mastoid portion of the temporal  bone, there is an asymmetric T2 hyperintense lesion 9 x 14 mm cross-section which shows mild postcontrast enhancement. This would be an unusual location for metastatic disease but this  is not excluded given the osseous disease elsewhere. Temporal bone inflammatory process is also possible. Recommend CT of the temporal bones with contrast for further evaluation. Mild disease of the clivus is suspected in its superior aspect. This is questioned on precontrast T1 weighted images inferior and posterior to the sella. Heterogeneity of calvarial bone marrow suggest osseous diploic involvement is possible. No sinus or orbital disease. Negative RIGHT temporal bone. Major dural venous sinuses are patent. IMPRESSION: 7 mm apparently solitary intra-axial intracranial metastasis with marked surrounding edema and mild susceptibility suggesting hemorrhage or calcification. Unusual LEFT medial mastoid temporal bone enhancing lesion 9 x 14 mm cross-section. Consider CT temporal bone with contrast for further evaluation. Metastatic disease to the clivus and calvarium not excluded. See discussion above. Electronically Signed By: Rolla Flatten M.D. On: 04/14/2014 11:54   Nm Bone Scan Whole Body  04/15/2014 CLINICAL DATA: History of breast cancer, stage IV. EXAM: NUCLEAR MEDICINE WHOLE BODY BONE SCAN TECHNIQUE: Whole body anterior and posterior images were obtained approximately 3 hours after intravenous injection of radiopharmaceutical. RADIOPHARMACEUTICALS: 25 mCi Technetium-99 MDP COMPARISON: Chest CT 04/15/2014 FINDINGS: Soft tissue activity and urinary activity is present - no superscan. At the time of imaging, renal activity has largely washed out. There is a mid thoracic spine photopenic area correlating with large T8 metastasis. Spinal metastases throughout the thoracic and lumbar spine that are known from recent MRI and CT are not clearly visible. IMPRESSION: 1. Osseous metastatic disease known by CT and MRI is significantly underestimated by bone scan - likely due to predominantly lytic nature. 2. T8 photopenia related to large lytic metastasis and surgical defect. Electronically  Signed By: Monte Fantasia M.D. 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 Does the need for close, 24 hr/day medical supervision in concert with the patient's rehab needs make it unreasonable for this patient to be served in a less intensive setting? Yes 2. Co-Morbidities requiring supervision/potential complications: breast cancer, pain control, nutrition 3. Due to bladder management, bowel management, safety, skin/wound care, disease management, medication administration, pain management and patient education, does the patient require 24 hr/day rehab nursing? Yes 4. Does the patient require coordinated care of a physician, rehab nurse, PT (1-2 hrs/day, 5 days/week) and OT (1-2 hrs/day, 5 days/week) to address physical and functional deficits in the context of the above medical diagnosis(es)? Yes Addressing deficits in the following areas: balance, endurance, locomotion, strength, transferring, bowel/bladder control, bathing, dressing, feeding, grooming, toileting and psychosocial support 5. Can the patient actively participate in an intensive therapy program of at least 3 hrs of therapy per day at least 5 days per week? Yes 6. The potential for patient to make measurable gains while on inpatient rehab is excellent 7. Anticipated functional outcomes upon discharge from inpatient rehab are modified independent with PT, modified independent and supervision with OT, n/a with SLP. 8. Estimated rehab length of stay to reach the above functional goals is: likely 7-10 days 9. Does the patient have adequate social supports and living environment to accommodate these discharge functional goals? Yes 10. Anticipated D/C setting: Home 11. Anticipated post D/C treatments: HH therapy and Outpatient therapy 12. Overall Rehab/Functional Prognosis: excellent  RECOMMENDATIONS: This patient's condition  is appropriate for continued rehabilitative care in the following setting: CIR Patient  has agreed to participate in recommended program. Yes Note that insurance prior authorization may be required for reimbursement for recommended care.  Comment: Awaiting therapy assessments. Rehab Admissions Coordinator to follow up.  Thanks,  Meredith Staggers, MD, Mellody Drown     04/16/2014       Revision History     Date/Time User Provider Type Action   04/16/2014 11:43 AM Meredith Staggers, MD Physician Sign   04/16/2014 11:42 AM Meredith Staggers, MD Physician Share   04/16/2014 9:22 AM Bary Leriche, PA-C Physician Assistant Share   View Details Report       Routing History     Date/Time From To Method   04/16/2014 11:43 AM Meredith Staggers, MD Meredith Staggers, MD In Basket   04/16/2014 11:43 AM Meredith Staggers, MD [name], MD Fax

## 2014-04-20 NOTE — Progress Notes (Signed)
Patient ID: Carolyn [name], female   DOB: 04/03/1970, 44 y.o.   MRN: [id number] Patient admitted to [phone] via bed, escorted by nursing staff.  Patient verbalized understanding of rehab process, signed fall safety agreement.  Appears to be in no immediate distress at this time.  Will continue to monitor.  Brita Romp, RN

## 2014-04-20 NOTE — Clinical Social Work Note (Signed)
Patient discharging to [facility] today.  Clinical Social Worker will sign off for now as social work intervention is no longer needed. Please consult [place] again if new need arises.  Glendon Axe, MSW, LCSWA [phone] 04/20/2014 1:52 PM

## 2014-04-20 NOTE — Progress Notes (Signed)
Report given to [facility] on Rehab. Patient will be discharged to 4W05. All belongings sent with patient at this time. Patient will contact  Family.   Ave Filter, RN

## 2014-04-20 NOTE — Progress Notes (Signed)
Rehab admissions - I have approval for acute inpatient rehab admission for today.  Bed available and will admit to inpatient rehab today.  Call me for questions.  RC:[id number]

## 2014-04-20 NOTE — PMR Pre-admission (Signed)
PMR Admission Coordinator Pre-Admission Assessment  Patient: Carolyn [name] is an 44 y.o., female MRN: [id number] DOB: 1970-11-01 Height: '5\' 8"'  (172.7 cm) Weight: 58.5 kg (128 lb 15.5 oz)              Insurance Information HMO:      PPO: Yes     PCP:       IPA:       80/20:       OTHER:  Blue options PRIMARY: BCBS state health plan      Policy#: [id number]      Subscriber: [name] [place] Name: Malachy Mood      Phone#: [phone]     Fax#: [phone] Pre-Cert#: [id number] precert for 7 days initially     Employer: [employer] Benefits:  Phone #: [phone]     Name: Automated Eff. 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 MRI thoracolumbar spine revealed pathologic compression fracture of T8 with retropulsed bone and epidural tumor resulting cord compression, significant tumor involvement T7 and T9 and widespread osseous disease throughout thoracic and lumbar spine. 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 She was evaluated by Dr. EEllene Routeand underwent laminectomy T8 with  decompression of cord and T6-T10 fixation on 04/04. 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 Dr. MJana Hakimconsulted for input and CT chest done revealing tiny pulmonary nodules new since 05/2014 question early mets. She is to follow up with him in 2 weeks to likely initiate systemic therapy with anti-estrogens and cyclin inhibitors . TLSO ordered for support prior to mobilization. She was noted to have ABLA with hgb down to 6.4 and was transfused with 2 units PRBC. To follow up with Radiation Onc on 04/13 for XRT simulation. Patient continues to have BLE weakness with ataxic gait and requires assistance with ADL tasks. CIR recommended for follow up therapy and patient admitted today.   Past Medical History  Past Medical History  Diagnosis Date  . 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 Hx of radiation therapy 11/08/11 -12/26/11    right chest wall/supraclav fossa, right scar  . History of chemotherapy 06/27/11 -09/04/11    neoadjuvant    Family History  family history includes Breast cancer (age of onset: 426 in her maternal aunt; Leukemia in her maternal aunt; Ovarian cancer (age of onset: 440 in her cousin; Pancreatic cancer in her maternal aunt and maternal grandfather.  Prior Rehab/Hospitalizations:  Had outpatient PT 2013 after breast  cancer on [employer].   Current Medications   Current facility-administered medications:  .  0.9 %  sodium chloride infusion, 250 mL, Intravenous, Continuous, Kristeen Miss, MD .  0.9 %  sodium chloride infusion, , Intravenous, Continuous, Kristeen Miss, MD, Last Rate: 75 mL/hr at 04/19/14 0640 .  acetaminophen (TYLENOL) tablet 650 mg, 650 mg, Oral, Q4H PRN **OR** acetaminophen (TYLENOL) suppository 650 mg, 650 mg, Rectal, Q4H PRN, Kristeen Miss, MD .  alum & mag hydroxide-simeth (MAALOX/MYLANTA) 200-200-20 MG/5ML suspension 30 mL, 30 mL, Oral, Q6H PRN, Kristeen Miss, MD .  bisacodyl (DULCOLAX)  suppository 10 mg, 10 mg, Rectal, Daily PRN, Kristeen Miss, MD, 10 mg at 04/17/14 0027 .  diazepam (VALIUM) tablet 5 mg, 5 mg, Oral, Q6H PRN, Kristeen Miss, MD, 5 mg at 04/19/14 0656 .  docusate sodium (COLACE) capsule 100 mg, 100 mg, Oral, BID, Kristeen Miss, MD, 100 mg at 04/20/14 4287 .  feeding supplement (ENSURE ENLIVE) (ENSURE ENLIVE) liquid 237 mL, 237 mL, Oral, BID BM, Ardeen Garland, RD, 237 mL at 04/20/14 1106 .  HYDROcodone-acetaminophen (NORCO/VICODIN) 5-325 MG per tablet 1 tablet, 1 tablet, Oral, Q4H PRN, Kristeen Miss, MD, 1 tablet at 04/13/14 2348 .  magnesium hydroxide (MILK OF MAGNESIA) suspension 30 mL, 30 mL, Oral, Q4H PRN, Kristeen Miss, MD .  menthol-cetylpyridinium (CEPACOL) lozenge 3 mg, 1 lozenge, Oral, PRN **OR** phenol (CHLORASEPTIC) mouth spray 1 spray, 1 spray, Mouth/Throat, PRN, Kristeen Miss, MD .  methocarbamol (ROBAXIN) tablet 500 mg, 500 mg, Oral, Q6H PRN, 500 mg at 04/20/14 1106 **OR** methocarbamol (ROBAXIN) 500 mg in dextrose 5 % 50 mL IVPB, 500 mg, Intravenous, Q6H PRN, Kristeen Miss, MD .  morphine 2 MG/ML injection 1-4 mg, 1-4 mg, Intravenous, Q3H PRN, Kristeen Miss, MD .  ondansetron [facility]) injection 4 mg, 4 mg, Intravenous, Q4H PRN, Kristeen Miss, MD, 4 mg at 04/17/14 1023 .  oxyCODONE-acetaminophen (PERCOCET/ROXICET) 5-325 MG per tablet 1-2 tablet, 1-2 tablet, Oral, Q4H PRN, Kristeen Miss, MD, 1 tablet at 04/20/14 1106 .  polyethylene glycol (MIRALAX / GLYCOLAX) packet 17 g, 17 g, Oral, Daily PRN, Kristeen Miss, MD, 17 g at 04/16/14 1957 .  senna (SENOKOT) tablet 8.6 mg, 1 tablet, Oral, BID, Kristeen Miss, MD, 8.6 mg at 04/20/14 6811 .  sodium chloride 0.9 % injection 3 mL, 3 mL, Intravenous, Q12H, Kristeen Miss, MD, 3 mL at 04/18/14 2200 .  sodium chloride 0.9 % injection 3 mL, 3 mL, Intravenous, PRN, Kristeen Miss, MD .  zolpidem [facility]) tablet 5 mg, 5 mg, Oral, QHS PRN, Newman Pies, MD, 5 mg at 04/19/14 2202  Patients Current Diet: Diet regular Room service  appropriate?: Yes; Fluid consistency:: Thin  Precautions / Restrictions Precautions Precautions: Fall, Back Precaution Booklet Issued: Yes (comment) Precaution Comments: educated in back precautions related to ADL Spinal Brace: Thoracolumbosacral orthotic, Applied in supine position Restrictions Weight Bearing Restrictions: No Other Position/Activity Restrictions: No order in chart for brace at this time but MD notes state she needs for mobility.   Prior Activity Level Community (5-7x/wk): Went out daily.  worked FT at [employer].  Not driving.  Takes the bus for transportation.   Home Assistive Devices / Equipment Home Assistive Devices/Equipment: None Home Equipment: None  Prior Functional Level Prior Function Level of Independence: Independent  Current Functional Level Cognition  Overall Cognitive Status: Within Functional Limits for tasks assessed Orientation Level: Oriented X4    Extremity Assessment (includes Sensation/Coordination)  Upper Extremity Assessment: Overall WFL for tasks assessed  Lower Extremity  Assessment: RLE deficits/detail, LLE deficits/detail RLE Deficits / Details: noted weakness 4/5 gross motions RLE Coordination: decreased fine motor, decreased gross motor LLE Deficits / Details: noted weakness 4/5 gross motions LLE Coordination: decreased fine motor, decreased gross motor    ADLs  Overall ADL's : Needs assistance/impaired Eating/Feeding: Independent, Sitting Grooming: Wash/dry hands, Wash/dry face, Oral care, Standing, Min guard Upper Body Bathing: Supervision/ safety, Bed level Lower Body Bathing: Minimal assistance, Sit to/from stand Upper Body Dressing : Maximal assistance, Bed level Upper Body Dressing Details (indicate cue type and reason): For donning and doffing TLSO in supine Lower Body Dressing: Set up, Minimal assistance, Sit to/from stand Lower Body Dressing Details (indicate cue type and reason): donned socks with set up, min  assist for dressing in standing Toilet Transfer: Minimal assistance, Comfort height toilet, Ambulation, Grab bars, RW Toileting- Clothing Manipulation and Hygiene: Minimal assistance, Sit to/from stand Toileting - Clothing Manipulation Details (indicate cue type and reason): instructed in availability of toilet tongs for pericare to avoid twisting Functional mobility during ADLs: Minimal assistance, Rolling walker General ADL Comments: Instructed pt on benefits of seated showering for safety and option of using a resin chair.    Mobility  Overal bed mobility: Needs Assistance Bed Mobility: Rolling, Sidelying to Sit Rolling: Min guard Sidelying to sit: Min assist Sit to supine: Min assist General bed mobility comments: VCs and assist for donning brace, assist to elevate trunk to EOB    Transfers  Overall transfer level: Needs assistance Equipment used: Rolling walker (2 wheeled) Transfers: Sit to/from Stand Sit to Stand: Min assist General transfer comment: Performed x3, VCs for hand placement initially, assist for elevation to standing during power up to get hands to RW    Ambulation / Gait / Stairs / Wheelchair Mobility  Ambulation/Gait Ambulation/Gait assistance: Mod assist (Min assist with RW, Moderate assist without device) Ambulation Distance (Feet): 90 Feet (90 ft x1 with RW, 60 ft x2 without RW) Assistive device: Rolling walker (2 wheeled), 1 person hand held assist Gait Pattern/deviations: Step-to pattern, Decreased stride length, Steppage, Ataxic Gait velocity: decreased General Gait Details: Patient improving with use of RW but continues to demonstrate instability with turns and increased distance while using RW.  During mobility without device, patient required moderate assist for stability with multiple LOB noted. hand held assist provided. Patient with difficulty coordinating gait (scissoring and ataxic) without device.  (2 steaded rest breaks between gait training trials)     Posture / Balance Balance Overall balance assessment: Needs assistance Sitting balance-Leahy Scale: Fair Standing balance-Leahy Scale: Fair Standing balance comment: able to release walker in standing to manage pants and perform standing grooming    Special needs/care consideration BiPAP/CPAP No CPM No Continuous Drip IV No Dialysis No     Life Vest No Oxygen No Special Bed No Trach Size No Wound Vac (area) No  Skin Thoracic surgical incision with dressing                              Bowel mgmt: Last BM 04/19/14 Bladder mgmt: Voiding WDL Diabetic mgmt No    Previous Home Environment Living Arrangements: Alone Available Help at Discharge: Family, Available PRN/intermittently (Going to sister and nephews house at discharge) Type of Home: Apartment Home Layout: One level Home Access: Stairs to enter Entrance Stairs-Rails: Right, Left Entrance Stairs-Number of Steps: 6-7 Bathroom Shower/Tub: Chiropodist: Standard Home Care Services: No  Discharge Living Setting Plans for  Discharge Living Setting: Lives with (comment), Apartment (Plans to go home with sister and nephew.) Type of Home at Discharge: Apartment (Apartment is on the 2nd level.) Discharge Home Layout: One level Discharge Home Access: Stairs to enter Entrance Stairs-Number of Steps: 6-7 steps Does the patient have any problems obtaining your medications?: No  Social/Family/Support Systems Patient Roles: Other (Comment) (Has a sister and a nephew.) Contact Information: Katherine Basset - sister Anticipated Caregiver: Katherine Basset, self and nephew Anticipated Caregiver's Contact Information: Maudie Mercury - (h) [phone] (c) [phone] Ability/Limitations of Caregiver: Sister works 6 am to 4 pm; nephew works PT. Caregiver Availability: Intermittent Discharge Plan Discussed with Primary Caregiver: Yes Is Caregiver In Agreement with Plan?: Yes Does Caregiver/Family have Issues with Lodging/Transportation  while Pt is in Rehab?: No  Goals/Additional Needs Patient/Family Goal for Rehab: PT mod I, OT mod I to supervision goals Expected length of stay: 7-10 days Cultural Considerations: Attends [facility] Dietary Needs: Regular diet Equipment Needs: TBD Pt/Family Agrees to Admission and willing to participate: Yes Program Orientation Provided & Reviewed with Pt/Caregiver Including Roles  & Responsibilities: Yes  Decrease burden of Care through IP rehab admission: N/A  Possible need for SNF placement upon discharge: Not anticipated  Patient Condition: This patient's medical and functional status has changed since the consult dated: 04/16/14 in which the Rehabilitation Physician determined and documented that the patient's condition is appropriate for intensive rehabilitative care in an inpatient rehabilitation facility. See "History of Present Illness" (above) for medical update. Functional changes are: Currently requiring min/mod assist to ambulate 90 ft RW. Patient's medical and functional status update has been discussed with the Rehabilitation physician and patient remains appropriate for inpatient rehabilitation. Will admit to inpatient rehab today.  Preadmission Screen Completed By:  [name], 04/20/2014 12:14 PM ______________________________________________________________________   Discussed status with Dr. Naaman Plummer on 04/20/14 at 1214 and received telephone approval for admission today.  Admission Coordinator:  [name], time1214/Date04/11/16

## 2014-04-20 NOTE — Progress Notes (Signed)
Chaplain responded to page for pt needing assistance with advanced directive. Chaplain answered pt questions about living will portion. Pt will discuss HCPOA with her sister. Page chaplain when advanced directive is filled out and ready to be notarized. Page chaplain as needed.    04/20/14 1600  Clinical Encounter Type  Visited With Patient  Visit Type Follow-up;Spiritual support  Referral From Nurse  Stress Factors  Patient Stress Factors Health changes  Advance Directives (For Healthcare)  Does patient have an advance directive? No  Would patient like information on creating an advanced directive? Yes - Educational materials given  Marcelino Scot 04/20/2014 4:09 PM

## 2014-04-20 NOTE — H&P (Signed)
Physical Medicine and Rehabilitation Admission H&P   Chief Complaint  Patient presents with  . 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   HPI: Carolyn [name] is a 44 y.o. 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 MRI thoracolumbar spine revealed pathologic compression fracture of T8 with retropulsed bone and epidural tumor resulting cord compression, significant tumor involvement T7 and T9 and widespread osseous disease throughout thoracic and lumbar spine. 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 She was evaluated by Dr. Ellene Route and underwent laminectomy T8 with decompression of cord and T6-T10 fixation on 04/04. 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   Dr. [name] consulted for input and CT chest done revealing tiny pulmonary nodules new since 05/2014 question early mets. She is to follow up with him in 2 weeks to likely initiate systemic therapy with anti-estrogens and cyclin inhibitors . TLSO ordered for support prior to mobilization. She was noted to have ABLA with hgb down to 6.4 and was transfused with 2 units PRBC. To follow up with Radiation Onc on 04/13 for XRT simulation. Patient continues to have BLE weakness with ataxic gait and requires assistance with ADL tasks. CIR recommended for follow up therapy and patient admitted today.    Review of Systems  HENT: Negative for hearing loss and tinnitus.  Eyes: Negative for blurred vision and double vision.  Respiratory: Negative for cough and shortness of breath.  Cardiovascular: Negative for chest pain and palpitations.  Gastrointestinal: Negative for heartburn, nausea, abdominal pain and constipation.    Genitourinary: Negative for urgency and frequency.  Musculoskeletal: Positive for myalgias and back pain (improving).  Neurological: Positive for sensory change (bilateral feet) and focal weakness. Negative for dizziness and headaches.  Psychiatric/Behavioral: Negative for depression. The patient is not nervous/anxious and does not have insomnia.      Past Medical History  Diagnosis Date  . 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 Hx of radiation therapy 11/08/11 -12/26/11    right chest wall/supraclav fossa, right scar  . History of chemotherapy 06/27/11 -09/04/11    neoadjuvant    Past Surgical History  Procedure Laterality Date  . Wisdom tooth extraction    . Portacath placement  06/20/2011    Procedure: INSERTION PORT-A-CATH; Surgeon: Haywood Lasso, MD; Location: [place]; Service: General; Laterality: N/A;  . Modified mastectomy  10/03/2011    Procedure: MODIFIED MASTECTOMY; Surgeon: Haywood Lasso, MD; Location: [facility]; Service: General; Laterality: Right;  . Port-a-cath removal  01/30/2012    Procedure: MINOR REMOVAL PORT-A-CATH; Surgeon: Haywood Lasso, MD; Location: [place]; Service: General; Laterality: Left;  . Breast biopsy      Left  . Robotic assisted total hysterectomy with bilateral salpingo oopherectomy Bilateral 12/23/2012    Procedure: ROBOTIC ASSISTED TOTAL HYSTERECTOMY WITH BILATERAL SALPINGO OOPHORECTOMY; Surgeon: Marvene Staff, MD; Location: [facility]; Service: Gynecology; Laterality: Bilateral;  . Cholecystectomy N/A 03/01/2014    Procedure: LAPAROSCOPIC CHOLECYSTECTOMY; Surgeon: Leighton Ruff, MD; Location: [facility]; Service: General; Laterality: N/A;  . Laminectomy N/A 04/13/2014    Procedure: THORACIC LAMINECTOMY WITH FIXATION THORACIC SIX-THORACIC TEN FUSION; Surgeon:  Kristeen Miss, MD; Location: [facility]; Service: Neurosurgery; Laterality:  N/A;    Family History  Problem Relation Age of Onset  . Breast cancer Maternal Aunt 7  . Pancreatic cancer Maternal Grandfather   . Pancreatic cancer Maternal Aunt     died in her 73s  . Leukemia Maternal Aunt     died in her 58s  . Ovarian cancer Cousin 11    maternal cousin    Social History: Single. 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 Has a supportive family with sisters who plan on assisting past discharge. She reports that she has never smoked. She has never used smokeless tobacco. She reports that she does not drink alcohol or use illicit drugs.    Allergies  Allergen Reactions  . Allegra [Fexofenadine] Hives    Abdomen only  . Shellfish Allergy Hives    Abdomen only    Medications Prior to Admission  Medication Sig Dispense Refill  . Cholecalciferol (VITAMIN D3) 2000 UNITS TABS Take 2,000 Units by mouth daily.    Marland Kitchen gabapentin (NEURONTIN) 100 MG capsule Take 100 mg by mouth 3 (three) times daily.    . methocarbamol (ROBAXIN) 500 MG tablet Take 500 mg by mouth every 6 (six) hours as needed for muscle spasms.    . Multiple Vitamin (MULTIVITAMIN) tablet Take 1 tablet by mouth daily.    . tamoxifen (NOLVADEX) 20 MG tablet Take 1 tablet (20 mg total) by mouth daily. 90 tablet 3  . acetaminophen (TYLENOL) 325 MG tablet Take 2 tablets (650 mg total) by mouth every 6 (six) hours as needed for mild pain, moderate pain, fever or headache. (Patient not taking: Reported on 04/12/2014)    . HYDROcodone-acetaminophen (NORCO/VICODIN) 5-325 MG per tablet Take 1 tablet by mouth every 4 (four) hours as needed for moderate pain. (Patient not taking: Reported on 04/12/2014) 40 tablet 0  . ibuprofen (ADVIL,MOTRIN) 200 MG tablet You can take 2-3 tablets every 6 hours safely for pain. (Patient not taking: Reported on 04/12/2014)      . venlafaxine XR (EFFEXOR-XR) 37.5 MG 24 hr capsule Take 1 capsule (37.5 mg total) by mouth daily with breakfast. (Patient not taking: Reported on 04/12/2014) 30 capsule 3    Home: Home Living Family/patient expects to be discharged to:: Private residence Living Arrangements: Alone Available Help at Discharge: Family, Available PRN/intermittently (Going to sister and nephews house at discharge) Type of Home: Apartment Home Access: Stairs to enter Technical brewer of Steps: 6-7 Entrance Stairs-Rails: Right, Left Home Layout: One level Home Equipment: None  Functional History: Prior Function Level of Independence: Independent  Functional Status:  Mobility: Bed Mobility Overal bed mobility: Needs Assistance Bed Mobility: Rolling, Sidelying to Sit Rolling: Min guard Sidelying to sit: Min assist Sit to supine: Min assist General bed mobility comments: VCs and assist for donning brace, assist to elevate trunk to EOB Transfers Overall transfer level: Needs assistance Equipment used: Rolling walker (2 wheeled) Transfers: Sit to/from Stand Sit to Stand: Min assist General transfer comment: Performed x3, VCs for hand placement initially, assist for elevation to standing during power up to get hands to RW Ambulation/Gait Ambulation/Gait assistance: Mod assist (Min assist with RW, Moderate assist without device) Ambulation Distance (Feet): 90 Feet (90 ft x1 with RW, 60 ft x2 without RW) Assistive device: Rolling walker (2 wheeled), 1 person hand held assist Gait Pattern/deviations: Step-to pattern, Decreased stride length, Steppage, Ataxic Gait velocity: decreased General Gait Details: Patient improving with use of RW but continues to demonstrate instability with turns and increased distance while using RW. During  mobility without device, patient required moderate assist for stability with multiple LOB noted. hand held assist provided. Patient with difficulty coordinating  gait (scissoring and ataxic) without device. (2 steaded rest breaks between gait training trials)    ADL: ADL Overall ADL's : Needs assistance/impaired Eating/Feeding: Independent, Sitting Grooming: Wash/dry hands, Wash/dry face, Oral care, Standing, Min guard Upper Body Bathing: Supervision/ safety, Bed level Lower Body Bathing: Minimal assistance, Sit to/from stand Upper Body Dressing : Maximal assistance, Bed level Upper Body Dressing Details (indicate cue type and reason): For donning and doffing TLSO in supine Lower Body Dressing: Set up, Minimal assistance, Sit to/from stand Lower Body Dressing Details (indicate cue type and reason): donned socks with set up, min assist for dressing in standing Toilet Transfer: Minimal assistance, Comfort height toilet, Ambulation, Grab bars, RW Toileting- Clothing Manipulation and Hygiene: Minimal assistance, Sit to/from stand Toileting - Clothing Manipulation Details (indicate cue type and reason): instructed in availability of toilet tongs for pericare to avoid twisting Functional mobility during ADLs: Minimal assistance, Rolling walker General ADL Comments: Instructed pt on benefits of seated showering for safety and option of using a resin chair.  Cognition: Cognition Overall Cognitive Status: Within Functional Limits for tasks assessed Orientation Level: Oriented X4 Cognition Arousal/Alertness: Awake/alert Behavior During Therapy: WFL for tasks assessed/performed Overall Cognitive Status: Within Functional Limits for tasks assessed   Blood pressure 98/58, pulse 92, temperature 98.4 F (36.9 C), temperature source Oral, resp. rate 21, height 5\' 8"  (1.727 m), weight 58.5 kg (128 lb 15.5 oz), last menstrual period 07/02/2011, SpO2 100 %. Physical Exam  Nursing note and vitals reviewed. Constitutional: She is oriented to person, place, and time. She appears well-developed and well-nourished.  HENT:  Head: Normocephalic and atraumatic.    Eyes: Conjunctivae are normal. Pupils are equal, round, and reactive to light.  Neck: Normal range of motion. Neck supple.  Cardiovascular: Regular rhythm. Tachycardia present.  Respiratory: Effort normal and breath sounds normal. No respiratory distress. She has no wheezes.  GI: Soft. Bowel sounds are normal. She exhibits no distension. There is no tenderness.  Musculoskeletal: She exhibits no edema.  Neurological: She is alert and oriented to person, place, and time. No cranial nerve deficit.  Speech clear. Follows commands without difficulty. RUE weakness proximally with decreased ROM at shoulder. BLE with proximal weakness and instability. Mild sensory deficits LLE: 1+/2 to LT,PP, proprioception. Strength 4/5 ue's prox to distal with some limitations due to pain. LE: 3+ hf, 4 ke and 4+ both ankles. BLE with 3+ DTR.  Skin: Skin is warm and dry.   back incision clean and dry.  Psychiatric: She has a normal mood and affect. Her behavior is normal. Judgment and thought content normal.      Lab Results Last 48 Hours    Results for orders placed or performed during the hospital encounter of 04/12/14 (from the past 48 hour(s))  CBC with Differential/Platelet Status: Abnormal   Collection Time: 04/19/14 7:20 PM  Result Value Ref Range   WBC 6.0 4.0 - 10.5 K/uL   RBC 3.44 (L) 3.87 - 5.11 MIL/uL   Hemoglobin 9.7 (L) 12.0 - 15.0 g/dL   HCT 29.3 (L) 36.0 - 46.0 %   MCV 85.2 78.0 - 100.0 fL   MCH 28.2 26.0 - 34.0 pg   MCHC 33.1 30.0 - 36.0 g/dL   RDW 15.3 11.5 - 15.5 %   Platelets 171 150 - 400 K/uL   Neutrophils Relative % 63 43 - 77 %  Neutro Abs 3.8 1.7 - 7.7 K/uL   Lymphocytes Relative 26 12 - 46 %   Lymphs Abs 1.6 0.7 - 4.0 K/uL   Monocytes Relative 8 3 - 12 %   Monocytes Absolute 0.5 0.1 - 1.0 K/uL   Eosinophils Relative 3 0 - 5 %   Eosinophils Absolute 0.2 0.0 - 0.7 K/uL   Basophils Relative  0 0 - 1 %   Basophils Absolute 0.0 0.0 - 0.1 K/uL      Imaging Results (Last 48 hours)    No results found.       Medical Problem List and Plan: 1. 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 DVT Prophylaxis/Anticoagulation: Mechanical: Sequential compression devices, below knee Bilateral lower extremities 3. Pain Management: Will start with OxyContin at bedtime to help with more consistent pain management. Continue oxy IR prn breakthrough pain.  4. Mood: She is reporting some anxiety about current situation. Will add low dose xanax prn. Team to provide ego support. Has good support system--family and her church. LCSW to follow for evaluation and support.  5. Neuropsych: This patient is capable of making decisions on his own behalf. 6. Skin/Wound Care: Routine pressure relief measures.  7. Fluids/Electrolytes/Nutrition: Monitor I/O. Check follow up labs in am. Offer snacks bid to augment intake.  8. ABLA: Improved past transfusion. Recheck labs in am. Add iron supplement.  9. Constipation: Continue Senna S 10. Hypokalemia: Likely dilutional from IVF. Recheck labs in am .     Post Admission Physician Evaluation: 1. 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 Patient is admitted to receive collaborative, interdisciplinary care between the physiatrist, rehab nursing staff, and therapy team. 3. Patient's level of medical complexity and substantial therapy needs in context of that medical necessity cannot be provided at a lesser intensity of care such as a SNF. 4. Patient has experienced substantial functional loss from his/her baseline which was documented above under the "Functional History" and "Functional Status" headings. Judging by the patient's diagnosis, physical exam, and functional history, the  patient has potential for functional progress which will result in measurable gains while on inpatient rehab. These gains will be of substantial and practical use upon discharge in facilitating mobility and self-care at the household level. 5. Physiatrist will provide 24 hour management of medical needs as well as oversight of the therapy plan/treatment and provide guidance as appropriate regarding the interaction of the two. 6. 24 hour rehab nursing will assist with bladder management, bowel management, safety, skin/wound care, disease management, medication administration, pain management and patient education and help integrate therapy concepts, techniques,education, etc. 7. PT will assess and treat for/with: Lower extremity strength, range of motion, stamina, balance, functional mobility, safety, adaptive techniques and equipment, NMR. Goals are: mod I. 8. OT will assess and treat for/with: ADL's, functional mobility, safety, upper extremity strength, adaptive techniques and equipment, NMR, pain control, ego support. Goals are: mod I. Therapy may proceed with showering this patient. 9. SLP will assess and treat for/with: n/a. Goals are: n/a. 10. Case Management and Social Worker will assess and treat for psychological issues and discharge planning. 11. Team conference will be held weekly to assess progress toward goals and to determine barriers to discharge. 12. Patient will receive at least 3 hours of therapy per day at least 5 days per week. 13. ELOS: 7-10 days  14. Prognosis: excellent     Meredith Staggers, MD, [facility] Physical Medicine & Rehabilitation 04/20/2014

## 2014-04-20 NOTE — Progress Notes (Signed)
Approval of [id number] Brain MRI from Ellie at [facility]; Auth# [id number] 4/11-10/12/2014

## 2014-04-20 NOTE — Clinical Social Work Psychosocial (Signed)
Clinical Social Work Department BRIEF PSYCHOSOCIAL ASSESSMENT 04/20/2014  Patient:  Carolyn [name],Carolyn [name]     Account Number:  [id number]     Admit date:  04/12/2014  Clinical Social Worker:  ,, LCSWA  Date/Time:  04/20/2014 08:00 AM  Referred by:  RN  Date Referred:  04/20/2014 Referred for  SNF Placement   Other Referral:   Interview type:  Patient Other interview type:    PSYCHOSOCIAL DATA Living Status:  [place] Admitted from facility:   Level of care:   Primary support name:  Mccleery,Kim Primary support relationship to patient:  SIBLING Degree of support available:   [facility]    CURRENT CONCERNS Current Concerns  None Noted   Other Concerns:    SOCIAL WORK ASSESSMENT / PLAN CSW met the pt at the bedside.  CSW introduced self and purpose of the visit. CSW discussed clinical recommendation for SNF rehab. CSW inquired about the geographical location in which the pt would like to receive rehab from. Pt reported that she will talk to her sister about SNF. CSW explained the SNF rehab process to the pt. CSW and pt discussed insurance and its relation to SNF rehab. CSW answered all questions in which the pt inquired about. CSW provided pt with contact information for further questions. CSW will continue to follow this pt and assist with discharge as needed.   Assessment/plan status:  Psychosocial Support/Ongoing Assessment of Needs Other assessment/ plan:   Information/referral to community resources:    PATIENT'S/FAMILY'S RESPONSE TO CURRENT DIAGNOSE: Pt presented with a normal affect and mood. Pt expressed gratitude about being alive. Pt provided CSW with detail regarding her medical history. Pt acknowledged her silver lining is due to the grace of God.    PATIENT'S/FAMILY'S RESPONSE TO PLAN OF CARE: Pt appeared disappointed about alternate placement. Pt expressed strong desire for CIR.    , MSW, LCSWA [phone]     

## 2014-04-21 ENCOUNTER — Inpatient Hospital Stay (HOSPITAL_COMMUNITY): Payer: BC Managed Care – PPO

## 2014-04-21 ENCOUNTER — Other Ambulatory Visit: Payer: BC Managed Care – PPO

## 2014-04-21 ENCOUNTER — Inpatient Hospital Stay (HOSPITAL_COMMUNITY): Payer: BC Managed Care – PPO | Admitting: Occupational Therapy

## 2014-04-21 ENCOUNTER — Telehealth: Payer: Self-pay | Admitting: *Deleted

## 2014-04-21 DIAGNOSIS — M79609 Pain in unspecified limb: Secondary | ICD-10-CM

## 2014-04-21 LAB — COMPREHENSIVE METABOLIC PANEL
ALT: 19 U/L (ref 0–35)
AST: 26 U/L (ref 0–37)
Albumin: 3.1 g/dL — ABNORMAL LOW (ref 3.5–5.2)
Alkaline Phosphatase: 99 U/L (ref 39–117)
Anion gap: 11 (ref 5–15)
BUN: 9 mg/dL (ref 6–23)
CALCIUM: 9.5 mg/dL (ref 8.4–10.5)
CHLORIDE: 103 mmol/L (ref 96–112)
CO2: 27 mmol/L (ref 19–32)
Creatinine, Ser: 0.77 mg/dL (ref 0.50–1.10)
GFR calc Af Amer: >90 mL/min (ref >90)
GFR calc non Af Amer: >90 mL/min (ref >90)
GLUCOSE: 133 mg/dL — AB (ref 70–99)
POTASSIUM: 3.6 mmol/L (ref 3.5–5.1)
Sodium: 141 mmol/L (ref 135–145)
TOTAL PROTEIN: 6.3 g/dL (ref 6.0–8.3)
Total Bilirubin: 0.5 mg/dL (ref 0.3–1.2)

## 2014-04-21 LAB — CBC WITH DIFFERENTIAL/PLATELET
Basophils Absolute: 0 10*3/uL (ref 0.0–0.1)
Basophils Relative: 0 % (ref 0–1)
EOS ABS: 0.2 10*3/uL (ref 0.0–0.7)
Eosinophils Relative: 4 % (ref 0–5)
HEMATOCRIT: 33.5 % — AB (ref 36.0–46.0)
HEMOGLOBIN: 10.8 g/dL — AB (ref 12.0–15.0)
LYMPHS ABS: 1.4 10*3/uL (ref 0.7–4.0)
Lymphocytes Relative: 26 % (ref 12–46)
MCH: 27.8 pg (ref 26.0–34.0)
MCHC: 32.2 g/dL (ref 30.0–36.0)
MCV: 86.1 fL (ref 78.0–100.0)
MONOS PCT: 7 % (ref 3–12)
Monocytes Absolute: 0.4 10*3/uL (ref 0.1–1.0)
NEUTROS ABS: 3.5 10*3/uL (ref 1.7–7.7)
NEUTROS PCT: 64 % (ref 43–77)
Platelets: 264 10*3/uL (ref 150–400)
RBC: 3.89 MIL/uL (ref 3.87–5.11)
RDW: 15 % (ref 11.5–15.5)
WBC: 5.5 10*3/uL (ref 4.0–10.5)

## 2014-04-21 MED ORDER — GADOBENATE DIMEGLUMINE 529 MG/ML IV SOLN
15.0000 mL | Freq: Once | INTRAVENOUS | Status: AC
  Administered 2014-04-21: 15 mL via INTRAVENOUS

## 2014-04-21 NOTE — Evaluation (Signed)
Physical Therapy Assessment and Plan  Patient Details  Name: Carolyn [name] MRN: [id number] Date of Birth: Sep 13, 1970  PT Diagnosis: Abnormality of gait, Ataxic gait, Difficulty walking, Impaired sensation, Muscle weakness, Paraplegia and Pain in back Rehab Potential: Good ELOS: 5-7 days   Today's Date: 04/21/2014 PT Individual Time: 0800-0900 PT Individual Time Calculation (min): 60 min    Problem List:  Patient Active Problem List   Diagnosis Date Noted  . 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 Malnutrition of moderate degree 04/13/2014  . Myelopathy 04/12/2014  . Pathologic fracture of thoracic vertebrae 04/12/2014  . Biliary colic 97/94/8016  . Breast cancer of upper-outer quadrant of right female breast 09/24/2013  . S/P total hysterectomy and bilateral salpingo-oophorectomy 12/23/2012  . History of chemotherapy   . Hx of radiation therapy     Past Medical History:  Past Medical History  Diagnosis Date  . 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 Hx of radiation therapy 11/08/11 -12/26/11    right chest wall/supraclav fossa, right scar  . History of chemotherapy 06/27/11 -09/04/11    neoadjuvant   Past Surgical History:  Past Surgical History  Procedure Laterality Date  . Wisdom tooth extraction    . Portacath placement  06/20/2011    Procedure: INSERTION PORT-A-CATH;  Surgeon: Haywood Lasso, MD;  Location: [place];  Service: General;  Laterality: N/A;  . Modified mastectomy  10/03/2011    Procedure: MODIFIED MASTECTOMY;  Surgeon: Haywood Lasso, MD;  Location: [place];  Service: General;  Laterality: Right;  . Port-a-cath removal  01/30/2012    Procedure: MINOR REMOVAL PORT-A-CATH;  Surgeon: Haywood Lasso, MD;  Location: [place];  Service: General;  Laterality: Left;  . Breast biopsy      Left  . Robotic assisted total hysterectomy with bilateral salpingo oopherectomy  Bilateral 12/23/2012    Procedure: ROBOTIC ASSISTED TOTAL HYSTERECTOMY WITH BILATERAL SALPINGO OOPHORECTOMY;  Surgeon: Marvene Staff, MD;  Location: [facility];  Service: Gynecology;  Laterality: Bilateral;  . Cholecystectomy N/A 03/01/2014    Procedure: LAPAROSCOPIC CHOLECYSTECTOMY;  Surgeon: Leighton Ruff, MD;  Location: [facility];  Service: General;  Laterality: N/A;  . Laminectomy N/A 04/13/2014    Procedure: THORACIC LAMINECTOMY WITH FIXATION THORACIC SIX-THORACIC TEN FUSION;  Surgeon: Kristeen Miss, MD;  Location: [facility];  Service: Neurosurgery;  Laterality: N/A;    Assessment & Plan Clinical Impression: Patient is a 44 y.o. year old female with recent admission to the hospital with history of stage IV Breast cancer s/p mastectomy 09/2011 who presented to ED 04/12/2014 with 2 week history of numbness from umbilicus down progressing to difficulty walking with BLE weakness. MRI thoracolumbar spine revealed pathologic compression fracture of T8 with retropulsed bone and epidural tumor resulting cord compression, significant tumor involvement T7 and T9 and widespread osseous disease throughout thoracic and lumbar spine. 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 She was evaluated by Dr. EEllene Routeand underwent laminectomy T8 with decompression of cord and T6-T10 fixation on 04/04. 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   Dr. MJana Hakimconsulted for input and CT chest done revealing tiny pulmonary nodules new since 05/2014 question early mets. She is to follow up with him in 2 weeks to likely initiate systemic therapy with anti-estrogens and cyclin inhibitors . TLSO ordered for support prior  to mobilization. She was noted to have ABLA with hgb down to 6.4 and was transfused with 2 units PRBC. To follow up with Radiation Onc on 04/13 for XRT simulation. Patient  continues to have BLE weakness with ataxic gait and requires assistance with ADL tasks. CIR recommended for follow up therapy and patient admitted today.  Patient transferred to CIR on 04/20/2014 .   Patient currently requires min to mod A with mobility secondary to muscle weakness and muscle paralysis, decreased cardiorespiratoy endurance, unbalanced muscle activation and ataxia and decreased sitting balance, decreased standing balance, decreased postural control, decreased balance strategies and difficulty maintaining precautions.  Prior to hospitalization, patient was independent  with mobility and lived with Alone in a Apartment.  Plan is for pt to discharge to sister's house which home access is 6-7Stairs to enter.  Patient will benefit from skilled PT intervention to maximize safe functional mobility, minimize fall risk and decrease caregiver burden for planned discharge home with intermittent assist.  Anticipate patient will benefit from follow up [facility] at discharge.  PT - End of Session Activity Tolerance: Decreased this session Endurance Deficit: Yes PT Assessment Rehab Potential (ACUTE/IP ONLY): Good Barriers to Discharge: Decreased caregiver support PT Patient demonstrates impairments in the following area(s): Balance;Endurance;Motor;Pain;Sensory;Skin Integrity PT Transfers Functional Problem(s): Bed Mobility;Bed to Chair;Car;Furniture PT Locomotion Functional Problem(s): Ambulation;Wheelchair Mobility;Stairs PT Plan PT Intensity: Minimum of 1-2 x/day ,45 to 90 minutes PT Frequency: 5 out of 7 days PT Duration Estimated Length of Stay: 5-7 days PT Treatment/Interventions: Ambulation/gait training;Balance/vestibular training;Community reintegration;Discharge planning;Disease management/prevention;DME/adaptive equipment instruction;Functional mobility training;Neuromuscular re-education;Pain management;Patient/family education;Psychosocial support;Skin care/wound  management;Splinting/orthotics;Stair training;Therapeutic Activities;Therapeutic Exercise;UE/LE Strength taining/ROM;UE/LE Coordination activities;Wheelchair propulsion/positioning PT Transfers Anticipated Outcome(s): S overall PT Locomotion Anticipated Outcome(s): S gait; mod I w/c mobility; min A stairs PT Recommendation Follow Up Recommendations: Home health PT;24 hour supervision/assistance Patient destination: Home (sister's home) Equipment Recommended: Rolling walker with 5" wheels;Wheelchair (measurements);Wheelchair cushion (measurements)  Skilled Therapeutic Intervention Individual treatment initiated with focus on functional transfers (bed <-> w/c, w/c <-> toilet, sit to stands), gait with and without RW to address functional gait and balance in standing, stair negotiation using bilateral rails to simulate home entry, and education on energy conservation initiated. Pt performing at min to mod A level with basic mobility at this time.   PT Evaluation Precautions/Restrictions Precautions Precautions: Fall;Back Precaution Comments: Pt able to recall 3/3 back precautions Required Braces or Orthoses: Spinal Brace Spinal Brace: Thoracolumbosacral orthotic;Applied in supine position Restrictions Weight Bearing Restrictions: No Home Living/Prior Functioning Home Living Available Help at Discharge: Family;Available PRN/intermittently Type of Home: Apartment Home Access: Stairs to enter Entrance Stairs-Number of Steps: 6-7 Entrance Stairs-Rails: Right;Left Home Layout: One level Additional Comments: Reports accessible via RW. Pt plans to stay at her sister's place. Information above is in regards to pt's sister's apartment. Nephew also lives there but works part time. Pt's sister works full time during the day.  Lives With: Alone Prior Function Level of Independence: Independent with transfers;Independent with gait;Independent with homemaking with ambulation;Independent with basic  ADLs  Able to Take Stairs?: Yes Driving: No Vocation: Full time employment Vocation Requirements: works as Licensed conveyancer at [employer] Overall Cognitive Status: Within Functional Limits for tasks assessed Safety/Judgment: Appears intact Sensation Sensation Light Touch: Impaired Detail Light Touch Impaired Details: Impaired RLE;Impaired LLE (reports some tingling in BLE) Proprioception: Impaired by gross assessment (BLE) Coordination Gross Motor Movements are Fluid and Coordinated: No (BLE) Coordination and Movement Description: decreased in BLE Motor  Motor Motor: Ataxia;Abnormal postural  alignment and control;Other (comment) (Paraparesis) Motor - Skilled Clinical Observations: TLSO for any upright or OOB mobiilty  Locomotion  Ambulation Ambulation/Gait Assistance: 3: Mod assist  Trunk/Postural Assessment  Cervical Assessment Cervical Assessment: Within Functional Limits Thoracic Assessment Thoracic Assessment: Exceptions to WFL (TLSO for OOB; back precautions) Lumbar Assessment Lumbar Assessment: Exceptions to WFL (TLSO for OOB mobility; back precautions) Postural Control Postural Control: Deficits on evaluation Postural Limitations: TLSO for any upright or OOB mobility; back precautions  Balance Balance Balance Assessed: Yes Static Sitting Balance Static Sitting - Level of Assistance: 5: Stand by assistance Dynamic Sitting Balance Dynamic Sitting - Level of Assistance: 5: Stand by assistance;4: Min assist Static Standing Balance Static Standing - Level of Assistance: 4: Min assist Dynamic Standing Balance Dynamic Standing - Level of Assistance: 4: Min assist;3: Mod assist Extremity Assessment      RLE Assessment RLE Assessment: Exceptions to [facility] LLE Assessment LLE Assessment: Exceptions to [facility]  FIM:  FIM - Control and instrumentation engineer Devices: Orthosis Bed/Chair Transfer: 4: Supine > Sit: Min A (steadying Pt. > 75%/lift 1 leg);4: Bed >  Chair or W/C: Min A (steadying Pt. > 75%) FIM - Locomotion: Wheelchair Locomotion: Wheelchair: 1: Travels less than 50 ft with supervision, cueing or coaxing FIM - Locomotion: Ambulation Ambulation/Gait Assistance: 3: Mod assist Locomotion: Ambulation: 1: Travels less than 50 ft with moderate assistance (Pt: 50 - 74%) FIM - Locomotion: Stairs Locomotion: Scientist, physiological: Hand rail - 2 Locomotion: Stairs: 2: Up and Down 4 - 11 stairs with minimal assistance (Pt.>75%)   Refer to Care Plan for Long Term Goals  Recommendations for other services: None  Discharge Criteria: Patient will be discharged from PT if patient refuses treatment 3 consecutive times without medical reason, if treatment goals not met, if there is a change in medical status, if patient makes no progress towards goals or if patient is discharged from hospital.  The above assessment, treatment plan, treatment alternatives and goals were discussed and mutually agreed upon: by patient  Juanna Cao, PT, DPT  04/21/2014, 2:17 PM

## 2014-04-21 NOTE — Progress Notes (Signed)
*  PRELIMINARY RESULTS* Vascular Ultrasound Lower extremity venous duplex has been completed.  Preliminary findings: Negative for DVT.   Landry Mellow, RDMS, RVT  04/21/2014, 11:25 AM

## 2014-04-21 NOTE — Progress Notes (Signed)
Physical Therapy Session Note  Patient Details  Name: Carolyn [name] MRN: [id number] Date of Birth: 1970-05-04  Today's Date: 04/21/2014 PT Individual Time: 1445-1530 PT Individual Time Calculation (min): 45 min   Short Term Goals: Week 1:  PT Short Term Goal 1 (Week 1): = LTGs  Skilled Therapeutic Interventions/Progress Updates:  Session focused on functional transfers with RW with emphasis on technique and foot placement with min A, bed mobility in hospital bed and regular bed (elevated height) in ADL apartment with S cueing for back precautions, household mobility with RW, simulated car transfer using RW with steady A, education on use of w/c for long distance and community mobility and energy conservation education, and gait training with RW over tiled and carpeted surfaces > 200' with min A. Pt demonstrates some ataxia with gait and decreased awareness of foot placement/step pattern due to impaired sensation/proprioception in BLE. Transferred to recliner end of session with steady A using RW and cues for safety. Discussed need for someone to assist her with donning the brace in supine upon discharge. Pt reports she is wasn't quite sure who this would be consistently. Suggested that maybe her sister could help her in the morning when she gets up and gets dressed and then keeps it on during the day. Pt states she would talk with her family.   Therapy Documentation Precautions:  Precautions Precautions: Fall, Back Precaution Booklet Issued: Yes (comment) Precaution Comments: Pt able to recall 3/3 back precautions Required Braces or Orthoses: Spinal Brace Spinal Brace: Thoracolumbosacral orthotic, Applied in supine position Restrictions Weight Bearing Restrictions: No   Pain:  No complaints of pain.  See FIM for current functional status  Therapy/Group: Individual Therapy  Canary Brim Ivory Broad, PT, DPT  04/21/2014, 3:33 PM

## 2014-04-21 NOTE — Progress Notes (Signed)
[place] [facility]YSICAL MEDICINE & REHABILITATION     PROGRESS NOTE    Subjective/Complaints: Had a great night. Denies any problems. Pain under control. Ready to start therapies today.  Objective: Vital Signs: Blood pressure 96/60, pulse 98, temperature 98.2 F (36.8 C), temperature source Oral, resp. rate 18, height 5\' 8"  (1.727 m), weight 60.6 kg (133 lb 9.6 oz), SpO2 100 %. No results found.  Recent Labs  04/19/14 1920  WBC 6.0  HGB 9.7*  HCT 29.3*  PLT 171   No results for input(s): NA, K, CL, GLUCOSE, BUN, CREATININE, CALCIUM in the last 72 hours.  Invalid input(s): CO CBG (last 3)  No results for input(s): GLUCAP in the last 72 hours.  Wt Readings from Last 3 Encounters:  04/20/14 60.6 kg (133 lb 9.6 oz)  04/13/14 58.5 kg (128 lb 15.5 oz)  02/28/14 60 kg (132 lb 4.4 oz)    Physical Exam:  Constitutional: She is oriented to person, place, and time. She appears well-developed and well-nourished.  HENT:  Head: Normocephalic and atraumatic.  Eyes: Conjunctivae are normal. Pupils are equal, round, and reactive to light.  Neck: Normal range of motion. Neck supple.  Cardiovascular: Regular rhythm. Tachycardia present.  Respiratory: Effort normal and breath sounds normal. No respiratory distress. She has no wheezes.  GI: Soft. Bowel sounds are normal. She exhibits no distension. There is no tenderness.  Musculoskeletal: She exhibits no edema.  Neurological: She is alert and oriented to person, place, and time. No cranial nerve deficit.  Speech clear. Follows commands without difficulty. RUE weakness proximally with decreased ROM at shoulder. BLE with proximal weakness and instability. Mild sensory deficits LLE: 1+/2 to LT,PP, proprioception. Strength 4/5 ue's prox to distal with some limitations due to pain. LE: 3+ hf, 4 ke and 4+ both ankles. BLE with 3+ DTR.  Skin: Skin is warm and dry.  back incision clean and dry.  Psychiatric: She has a normal mood and  affect. Her behavior is normal. Judgment and thought content normal.    Assessment/Plan: 1. 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 Physiatrist is providing close team supervision and 24 hour management of active medical problems listed below. Physiatrist and rehab team continue to assess barriers to discharge/monitor patient progress toward functional and medical goals. FIM:                   Comprehension Comprehension Mode: Auditory Comprehension: 6-Follows complex conversation/direction: With extra time/assistive device  Expression Expression Mode: Verbal Expression: 6-Expresses complex ideas: With extra time/assistive device  Social Interaction Social Interaction: 7-Interacts appropriately with others - No medications needed.       Medical Problem List and Plan: 1. 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 DVT Prophylaxis/Anticoagulation: Mechanical: Sequential compression devices, below knee Bilateral lower extremities 3. Pain Management:   OxyContin at bedtime to help with more consistent pain management. Continue oxy IR prn breakthrough pain.  4. Mood: She is reporting some anxiety about current situation.  low dose xanax prn. Team to provide ego support. Has good support system--family and her church. LCSW to follow for evaluation and support. consider neuropsych eval 5. Neuropsych: This patient is capable of making decisions on his own behalf. 6. Skin/Wound Care: Routine pressure relief measures.  7. Fluids/Electrolytes/Nutrition: Monitor I/O. Check follow up labs as available. Offer snacks bid to augment intake.  8. ABLA: Improved after transfusion. Labs pending.  Added iron supplement.  9. Constipation: Continue Senna S 10.  Hypokalemia: Likely dilutional from IVF. Recheck labs .   LOS (Days) 1 A FACE TO FACE EVALUATION WAS PERFORMED  SWARTZ,ZACHARY T 04/21/2014 7:58 AM

## 2014-04-21 NOTE — Evaluation (Signed)
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 Malnutrition of moderate degree 04/13/2014  . Myelopathy 04/12/2014  . Pathologic fracture of thoracic vertebrae 04/12/2014  . Biliary colic 92/33/0076  . Breast cancer of upper-outer quadrant of right female breast 09/24/2013  . S/P total hysterectomy and bilateral salpingo-oophorectomy 12/23/2012  . History of chemotherapy   . Hx of radiation therapy     Past Medical History:  Past Medical History  Diagnosis Date  . 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 Hx of radiation therapy 11/08/11 -12/26/11    right chest wall/supraclav fossa, right scar  . History of chemotherapy 06/27/11 -09/04/11    neoadjuvant   Past Surgical History:  Past Surgical History  Procedure Laterality Date  . Wisdom tooth extraction    . Portacath placement  06/20/2011    Procedure: INSERTION PORT-A-CATH;  Surgeon: Haywood Lasso, MD;  Location: [place];  Service: General;  Laterality: N/A;  . Modified mastectomy  10/03/2011    Procedure: MODIFIED MASTECTOMY;  Surgeon: Haywood Lasso, MD;  Location: [place];  Service: General;  Laterality: Right;  . Port-a-cath removal  01/30/2012    Procedure: MINOR REMOVAL PORT-A-CATH;  Surgeon: Haywood Lasso, MD;  Location: [place];  Service: General;  Laterality: Left;  . Breast biopsy      Left  . Robotic assisted total hysterectomy with bilateral salpingo  oopherectomy Bilateral 12/23/2012    Procedure: ROBOTIC ASSISTED TOTAL HYSTERECTOMY WITH BILATERAL SALPINGO OOPHORECTOMY;  Surgeon: Marvene Staff, MD;  Location: [facility];  Service: Gynecology;  Laterality: Bilateral;  . Cholecystectomy N/A 03/01/2014    Procedure: LAPAROSCOPIC CHOLECYSTECTOMY;  Surgeon: Leighton Ruff, MD;  Location: [facility];  Service: General;  Laterality: N/A;  . Laminectomy N/A 04/13/2014    Procedure: THORACIC LAMINECTOMY WITH FIXATION THORACIC SIX-THORACIC TEN FUSION;  Surgeon: Kristeen Miss, MD;  Location: [facility];  Service: Neurosurgery;  Laterality: N/A;    Assessment & Plan Clinical Impression: Patient is a 44 y.o. year old female with recent admission to the hospital with history of stage IV Breast cancer s/p mastectomy 09/2011 who presented to ED 04/12/2014 with 2 week history of numbness from umbilicus down progressing to difficulty walking with BLE weakness. MRI thoracolumbar spine revealed pathologic compression fracture of T8 with retropulsed bone and epidural tumor resulting cord compression, significant tumor involvement T7 and T9 and widespread osseous disease throughout thoracic and lumbar spine. 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 She was evaluated by Dr. Ellene Route and underwent laminectomy T8 with decompression of cord and T6-T10 fixation on 04/04. 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   Dr. [name] consulted for input and CT chest done revealing tiny pulmonary nodules new since 05/2014 question early mets. She is to follow up with him in 2 weeks to likely initiate systemic therapy with anti-estrogens and cyclin inhibitors . TLSO ordered  for support prior to mobilization. She was noted to have ABLA with hgb down to 6.4 and was transfused with 2 units PRBC. To follow up with Radiation Onc on 04/13 for XRT simulation.  Patient continues to have BLE weakness with ataxic gait and requires assistance with ADL tasks. CIR recommended for follow up therapy and patient admitted today. Patient transferred to CIR on 04/20/2014 .   Patient currently requires mod with basic self-care skills and IADL secondary to muscle weakness, decreased cardiorespiratoy endurance, decreased coordination and decreased standing balance, decreased postural control, decreased balance strategies and difficulty maintaining precautions.  Prior to hospitalization, patient could complete ADLs/IADLs with independent .  Patient will benefit from skilled intervention to decrease level of assist with basic self-care skills, increase independence with basic self-care skills and increase level of independence with iADL prior to discharge home with care partner.  Anticipate patient will require 24 hour supervision and follow up home health.  OT - End of Session Activity Tolerance: Tolerates 30+ min activity with multiple rests Endurance Deficit: Yes OT Assessment Rehab Potential (ACUTE ONLY): Good Barriers to Discharge: Decreased caregiver support OT Patient demonstrates impairments in the following area(s): Balance;Endurance;Motor;Safety;Sensory OT Basic ADL's Functional Problem(s): Bathing;Dressing;Toileting OT Advanced ADL's Functional Problem(s): Simple Meal Preparation;Laundry;Light Housekeeping OT Transfers Functional Problem(s): Toilet;Tub/Shower OT Plan OT Intensity: Minimum of 1-2 x/day, 45 to 90 minutes OT Frequency: 5 out of 7 days OT Duration/Estimated Length of Stay: 3-4 days OT Treatment/Interventions: Balance/vestibular training;Discharge planning;Community reintegration;DME/adaptive equipment instruction;Patient/family education;Psychosocial support;Self Care/advanced ADL retraining;Therapeutic Activities;Therapeutic Exercise;UE/LE Strength taining/ROM;UE/LE Coordination activities OT Self Feeding Anticipated Outcome(s): Mod I OT  Basic Self-Care Anticipated Outcome(s): Mod I OT Toileting Anticipated Outcome(s): Supervision OT Bathroom Transfers Anticipated Outcome(s): Supervision OT Recommendation Patient destination: Home Follow Up Recommendations: Home health OT Equipment Recommended: Tub/shower bench   Skilled Therapeutic Intervention Pt seen for OT eval and ADL bathing/ dressing session. Pt in w/c upon arrival, agreeable to tx. Eval completed from w/c level. Pt voicing desire for shower. She transferred w/c> bed with min A, hospital gown removed, and TLSO brace re-donned. Pt ambulated within room and into walk-in shower with min A for steadying. She completed seated showering task with min steadying assist, and VCs for technique for lateral leans to complete buttock hygiene. Pt returned to bed following showering task, and TLSO brace doffed, new pads inserted, and brace re-donned. Pt completed UB dressing in supine with set-up. Pt educated on how to wash and change TLSO pads upon d/c. Pt transferred supine>EOB with supervision and cues for log rolling technique. She transferred to w/c with min A. Pt stood at sink with min A to complete oral care. She returned to w/c, and self-propelled w/c~50 yards throughout unit. Assist then provided due to time. In ADL apartment, pt completed functional transfers on/off toilet, and on/off tub shower bench with min A. She sat on standard bed, and completed seated scoots around bed, focusing on balance and UE strengthening. Pt complete bed mobility on standard bed with  Overall supervision, requiring min A for sit<> stands from bed and from low soft couch. Pt returned to w/c and taken back to room. RN in pt's room upon arrival, making pt aware of upcoming procedure and requested pt to return to bed. OT assist with transfer back to bed with min A to EOB, and supervision and cues for EOB> supine. Pt left in supine with RN present at end of session.   Education provided regarding role of OT,  POC, pt's goals, CIR, energy  conservation, spinal cord injuries, need for assist, spinal precautions, purpose of TLSO, instructions of how to don TLSO, and d/c planning.   OT Evaluation Precautions/Restrictions  Precautions Precautions: Fall;Back Precaution Booklet Issued: Yes (comment) Precaution Comments: Pt able to recall 3/3 back precautions Required Braces or Orthoses: Spinal Brace Spinal Brace: Thoracolumbosacral orthotic;Applied in supine position Restrictions Weight Bearing Restrictions: No General Chart Reviewed: Yes Pain  No/denies pain Home Living/Prior Functioning Home Living Family/patient expects to be discharged to:: Private residence Living Arrangements: Alone Available Help at Discharge: Family, Available PRN/intermittently Type of Home: Apartment Home Access: Stairs to enter [employer] of Steps: 6-7 Entrance Stairs-Rails: Right, Left Home Layout: One level Additional Comments: Reports accessible via RW. Pt plans to stay at her sister's place. Information above is in regards to pt's sister's apartment. Nephew also lives there but works part time. Pt's sister works full time during the day.. Pt reports friends from church can help provide supervision upon d/c  Lives With: Alone IADL History Homemaking Responsibilities: Yes Mode of Transportation: Bus Occupation: Full time employment Type of Occupation: [employer] Prior Function Level of Independence: Independent with transfers, Independent with gait, Independent with homemaking with ambulation, Independent with basic ADLs  Able to Take Stairs?: Yes Driving: No Vocation: Full time employment Vocation Requirements: works as Licensed conveyancer at [employer] Leisure: Hobbies-yes (Comment) Comments: Volunteering Vision/Perception  Vision- History Baseline Vision/History: Wears glasses Wears Glasses: Reading only Patient Visual Report: No change from baseline Vision- Assessment Vision Assessment?: No apparent visual  deficits  Cognition Overall Cognitive Status: Within Functional Limits for tasks assessed Orientation Level: Oriented X4 Memory: Appears intact Awareness: Appears intact Problem Solving: Appears intact Safety/Judgment: Appears intact Comments: Pt able to demonstrate adherence to spinal precautions during functional tasks. Sensation Sensation Light Touch: Impaired Detail Light Touch Impaired Details: Impaired RLE;Impaired LLE Proprioception: Impaired by gross assessment Coordination Gross Motor Movements are Fluid and Coordinated: No (B LE) Coordination and Movement Description: decreased in BLE Motor  Motor Motor: Ataxia;Abnormal postural alignment and control;Other (comment) Motor - Skilled Clinical Observations: TLSO for any upright or OOB mobiilty Mobility  Bed Mobility Bed Mobility: Rolling Right;Rolling Left Rolling Right: 5: Supervision Rolling Left: 5: Supervision Transfers Transfers: Sit to Stand;Stand to Sit Sit to Stand: 4: Min assist Sit to Stand Details: Manual facilitation for weight shifting Stand to Sit: 4: Min guard Stand to Sit Details (indicate cue type and reason): Manual facilitation for weight shifting  Trunk/Postural Assessment  Cervical Assessment Cervical Assessment: Within Functional Limits Thoracic Assessment Thoracic Assessment: Exceptions to WFL (TLSO for OOB; back precautions) Lumbar Assessment Lumbar Assessment: Exceptions to WFL (TLSO for OOB; back precautions) Postural Control Postural Control: Deficits on evaluation Postural Limitations: TLSO for any upright or OOB mobility; back precautions  Balance Balance Balance Assessed: Yes Static Sitting Balance Static Sitting - Level of Assistance: 5: Stand by assistance Dynamic Sitting Balance Dynamic Sitting - Level of Assistance: 5: Stand by assistance;4: Min assist Static Standing Balance Static Standing - Level of Assistance: 4: Min assist Dynamic Standing Balance Dynamic Standing -  Level of Assistance: 4: Min assist Extremity/Trunk Assessment RUE Assessment RUE Assessment: Within Functional Limits LUE Assessment LUE Assessment: Within Functional Limits  FIM:  FIM - Control and instrumentation engineer Devices: Orthosis Bed/Chair Transfer: 4: Supine > Sit: Min A (steadying Pt. > 75%/lift 1 leg);4: Bed > Chair or W/C: Min A (steadying Pt. > 75%)   Refer to Care Plan for Long Term Goals  Recommendations for other services: None  Discharge Criteria: Patient  will be discharged from OT if patient refuses treatment 3 consecutive times without medical reason, if treatment goals not met, if there is a change in medical status, if patient makes no progress towards goals or if patient is discharged from hospital.  The above assessment, treatment plan, treatment alternatives and goals were discussed and mutually agreed upon: by patient  Ernestina Patches 04/21/2014, 3:25 PM

## 2014-04-21 NOTE — Progress Notes (Signed)
Patient information reviewed and entered into eRehab system by Lassie Demorest, RN, CRRN, PPS Coordinator.  Information including medical coding and functional independence measure will be reviewed and updated through discharge.    

## 2014-04-21 NOTE — Telephone Encounter (Signed)
Called [facility],spoke with RN Loree Fee, to arrange patient via [facility] transportation here tomorrow at [facility], Radiation Oncology dept for Ct simulation , will be lying on hard table for about 1 hour, medicate patient for pain/anxiety, patient also will have IV contrast, asked if patient had an IV site,"No, but she will aske IV team if they could start one today for the patient",thanked Whitney,RN 9:45 AM

## 2014-04-22 ENCOUNTER — Other Ambulatory Visit: Payer: Self-pay | Admitting: Radiation Oncology

## 2014-04-22 ENCOUNTER — Ambulatory Visit
Admit: 2014-04-22 | Discharge: 2014-04-22 | Disposition: A | Discharge status: Home / Self Care | Payer: BC Managed Care – PPO | Attending: Radiation Oncology | Admitting: Radiation Oncology

## 2014-04-22 ENCOUNTER — Ambulatory Visit: Payer: BC Managed Care – PPO | Admitting: Radiation Oncology

## 2014-04-22 ENCOUNTER — Encounter: Payer: Self-pay | Admitting: Radiation Therapy

## 2014-04-22 ENCOUNTER — Ambulatory Visit: Payer: BC Managed Care – PPO

## 2014-04-22 ENCOUNTER — Inpatient Hospital Stay: Admit: 2014-04-22 | Payer: BC Managed Care – PPO

## 2014-04-22 ENCOUNTER — Inpatient Hospital Stay (HOSPITAL_COMMUNITY): Payer: BC Managed Care – PPO | Admitting: Occupational Therapy

## 2014-04-22 ENCOUNTER — Inpatient Hospital Stay (HOSPITAL_COMMUNITY): Payer: BC Managed Care – PPO

## 2014-04-22 ENCOUNTER — Inpatient Hospital Stay: Admit: 2014-04-22 | Payer: BC Managed Care – PPO | Admitting: Radiation Oncology

## 2014-04-22 DIAGNOSIS — C7951 Secondary malignant neoplasm of bone: Principal | ICD-10-CM

## 2014-04-22 DIAGNOSIS — C50411 Malignant neoplasm of upper-outer quadrant of right female breast: Secondary | ICD-10-CM

## 2014-04-22 DIAGNOSIS — M8448XS Pathological fracture, other site, sequela: Secondary | ICD-10-CM

## 2014-04-22 DIAGNOSIS — Z853 Personal history of malignant neoplasm of breast: Secondary | ICD-10-CM | POA: Insufficient documentation

## 2014-04-22 DIAGNOSIS — Z510 Encounter for antineoplastic radiation therapy: Secondary | ICD-10-CM | POA: Insufficient documentation

## 2014-04-22 DIAGNOSIS — C7931 Secondary malignant neoplasm of brain: Secondary | ICD-10-CM | POA: Insufficient documentation

## 2014-04-22 DIAGNOSIS — G959 Disease of spinal cord, unspecified: Secondary | ICD-10-CM

## 2014-04-22 LAB — GLUCOSE, CAPILLARY: Glucose-Capillary: 108 mg/dL — ABNORMAL HIGH (ref 70–99)

## 2014-04-22 MED ORDER — LORAZEPAM 0.5 MG PO TABS
0.5000 mg | ORAL_TABLET | Freq: Once | ORAL | Status: DC
  Filled 2014-04-22: qty 1

## 2014-04-22 MED ORDER — DEXAMETHASONE 4 MG PO TABS
4.0000 mg | ORAL_TABLET | Freq: Four times a day (QID) | ORAL | Status: DC
  Administered 2014-04-22 – 2014-04-25 (×12): 4 mg via ORAL
  Filled 2014-04-22 (×16): qty 1

## 2014-04-22 MED ORDER — LORAZEPAM 0.5 MG PO TABS
0.5000 mg | ORAL_TABLET | Freq: Once | ORAL | Status: AC
  Administered 2014-04-22: 0.5 mg via ORAL
  Filled 2014-04-22: qty 1

## 2014-04-22 NOTE — Progress Notes (Signed)
PHYSICAL MEDICINE & REHABILITATION     PROGRESS NOTE    Subjective/Complaints: No complaints this am. Feels that things went well yesterday.   Objective: Vital Signs: Blood pressure 97/58, pulse 101, temperature 98.7 F (37.1 C), temperature source Oral, resp. rate 18, height 5\' 8"  (1.727 m), weight 60.6 kg (133 lb 9.6 oz), SpO2 98 %. [name] Wo Contrast  04/21/2014   CLINICAL DATA:  Metastatic breast cancer  EXAM: MRI HEAD WITHOUT AND WITH CONTRAST  TECHNIQUE: Multiplanar, multiecho pulse sequences of the brain and surrounding structures were obtained without and with intravenous contrast.  CONTRAST:  81mL MULTIHANCE GADOBENATE DIMEGLUMINE 529 MG/ML IV SOLN  COMPARISON:  MRI head 04/14/2014  FINDINGS: 11 mm enhancing mass in the left posterior centrum semiovale shows interval enlargement since the prior study when it measured approximately 7 mm. Moderate surrounding vasogenic edema is similar. Mild hemorrhage is more prominent on the current study due to higher field strength 3 Tesla imaging. No other intra-axial enhancing mass lesions identified in the brain  Enhancing mass lesion in the left medial mastoid portion of the temporal bone measures approximately 15 x 9 mm. This is unusual in location for metastatic disease but may represent bony metastatic disease. This is unchanged from the prior study.  Patchy signal abnormality in the upper cervical spine could represent metastatic disease. Heterogeneous signal in the clivus and calvarium could represent metastatic disease however it does not show significant focal enhancement suggesting tumor.  IMPRESSION: 11 mm metastatic deposit left posterior centrum semiovale shows interval enlargement from the prior study. This is a hemorrhagic lesion with surrounding white matter edema  Enhancing mass in the left mastoid segment of the temporal bone, worrisome for bony metastatic disease. Possible other bony metastatic disease including the  cervical spine and calvarium.   Electronically Signed   By: Carolyn [name] M.D.   On: 04/21/2014 15:15    Recent Labs  04/19/14 1920 04/21/14 0910  WBC 6.0 5.5  HGB 9.7* 10.8*  HCT 29.3* 33.5*  PLT 171 264    Recent Labs  04/21/14 0910  NA 141  K 3.6  CL 103  GLUCOSE 133*  BUN 9  CREATININE 0.77  CALCIUM 9.5   CBG (last 3)  No results for input(s): GLUCAP in the last 72 hours.  Wt Readings from Last 3 Encounters:  04/20/14 60.6 kg (133 lb 9.6 oz)  04/13/14 58.5 kg (128 lb 15.5 oz)  02/28/14 60 kg (132 lb 4.4 oz)    Physical Exam:  Constitutional: She is oriented to person, place, and time. She appears well-developed and well-nourished.  HENT:  Head: Normocephalic and atraumatic.  Eyes: Conjunctivae are normal. Pupils are equal, round, and reactive to light.  Neck: Normal range of motion. Neck supple.  Cardiovascular: Regular rhythm. Tachycardia present.  Respiratory: Effort normal and breath sounds normal. No respiratory distress. She has no wheezes.  GI: Soft. Bowel sounds are normal. She exhibits no distension. There is no tenderness.  Musculoskeletal: She exhibits no edema.  Neurological: She is alert and oriented to person, place, and time. No cranial nerve deficit.  Speech clear. Follows commands without difficulty. RUE weakness proximally with decreased ROM at shoulder. BLE with proximal weakness and instability. Mild sensory deficits LLE: 1+/2 to LT,PP, proprioception. Strength 4/5 ue's prox to distal with some limitations due to pain. LE: 3+ hf, 4 ke and 4+ both ankles. BLE with 3+ DTR.  Skin: Skin is warm and dry.  back incision clean and  dry.  Psychiatric: She has a normal mood and affect. Her behavior is normal. Judgment and thought content normal.    Assessment/Plan: 1. Functional deficits secondary to metastatic breast cancer to the thoracic spine which require 3+ hours per day of interdisciplinary therapy in a comprehensive inpatient  rehab setting. Physiatrist is providing close team supervision and 24 hour management of active medical problems listed below. Physiatrist and rehab team continue to assess barriers to discharge/monitor patient progress toward functional and medical goals. FIM: FIM - Bathing Bathing Steps Patient Completed: Chest, Right Arm, Left Arm, Abdomen, Front perineal area, Buttocks, Right upper leg, Left upper leg, Right lower leg (including foot), Left lower leg (including foot) Bathing: 4: Steadying assist  FIM - Upper Body Dressing/Undressing Upper body dressing/undressing steps patient completed: Thread/unthread right sleeve of pullover shirt/dresss, Thread/unthread left sleeve of pullover shirt/dress, Put head through opening of pull over shirt/dress, Pull shirt over trunk Upper body dressing/undressing: 5: Set-up assist to: Apply TLSO, cervical collar FIM - Lower Body Dressing/Undressing Lower body dressing/undressing steps patient completed: Thread/unthread left pants leg, Thread/unthread right pants leg, Pull pants up/down Lower body dressing/undressing: 4: Steadying Assist     FIM - Radio producer Devices: Grab bars Toilet Transfers: 4-To toilet/BSC: Min A (steadying Pt. > 75%), 4-From toilet/BSC: Min A (steadying Pt. > 75%)  FIM - Control and instrumentation engineer Devices: Orthosis Bed/Chair Transfer: 4: Chair or W/C > Bed: Min A (steadying Pt. > 75%), 5: Sit > Supine: Supervision (verbal cues/safety issues)  FIM - Locomotion: Wheelchair Locomotion: Wheelchair: 1: Travels less than 50 ft with supervision, cueing or coaxing FIM - Locomotion: Ambulation Ambulation/Gait Assistance: 3: Mod assist Locomotion: Ambulation: 1: Travels less than 50 ft with moderate assistance (Pt: 50 - 74%)  Comprehension Comprehension Mode: Auditory Comprehension: 7-Follows complex conversation/direction: With no assist  Expression Expression Mode:  Verbal Expression: 7-Expresses complex ideas: With no assist  Social Interaction Social Interaction: 6-Interacts appropriately with others with medication or extra time (anti-anxiety, antidepressant).  Problem Solving Problem Solving: 7-Solves complex problems: Recognizes & self-corrects  Memory Memory: 7-Complete Independence: No helper Medical Problem List and Plan: 1. 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 Onc/rad-onc following  -decadron ordered 2. DVT Prophylaxis/Anticoagulation: Mechanical: Sequential compression devices, below knee Bilateral lower extremities 3. Pain Management:   OxyContin at bedtime to help with more consistent pain management. Continue oxy IR prn breakthrough pain.   4. Mood: She is reporting some anxiety about current situation.  low dose xanax prn. Team to provide ego support. Has good support system--family and her church. LCSW to follow for evaluation and support. consider neuropsych eval 5. Neuropsych: This patient is capable of making decisions on his own behalf. 6. Skin/Wound Care: Routine pressure relief measures.  7. Fluids/Electrolytes/Nutrition: Monitor I/O. Check follow up labs as available. Offer snacks bid to augment intake.  8. ABLA: Improved after transfusion. Labs pending.  Added iron supplement.  9. Constipation: Continue Senna S 10. Hypokalemia:  improved  LOS (Days) 2 A FACE TO FACE EVALUATION WAS PERFORMED  Aimar Borghi T 04/22/2014 8:11 AM

## 2014-04-22 NOTE — Progress Notes (Signed)
Occupational Therapy Session Note  Patient Details  Name: Carolyn [name] MRN: [id number] Date of Birth: 1970-09-10  Today's Date: 04/22/2014 OT Individual Time: 3662-9476 OT Individual Time Calculation (min): 45 min    Short Term Goals: Week 1:  OT Short Term Goal 1 (Week 1): STG=LTG due to short LOS  Skilled Therapeutic Interventions/Progress Updates:    Pt seen for OT ADL bathing and dressing session. Pt sitting in recliner upon arrival, finishing breakfast and agreeable to tx. Pt ambulated within room with CGA and RW. She sat at w/c at the sink to complete LB bathing/ dressing. Pt stood to complete pericare/buttock hygiene and pull pants up with steadying assist. Pt returned to bed with min A w/c>EOB and supervision EOB> supine to doff brace and complete UB bathing and dressing. Pt required assist to don/doff TLSO, and able to bathe and dress UB with supervision.    Brace donned and pt sat EOB with supervision. Seated EOB, pt completed standing balancing tasks in prep for standing ADL tasks. Pt able to stand feet even width apart with eyes open and closed without LOB. When standing staggered stepped, pt required min A to maintain balance requiring increased assist when eyes were closed. Standing balancing exercises completed x3 sets of each standing position. Pt then sat EOB and completing lifts using B LEs with VCs focusing on LE strengthening and coordination. At end of session, TLSO doffed and pt returned to supine in prep for off unit procedure, all needs in reach, and bed alarm on.     Education provided regarding energy conservation, need for assist, DME, and d/c planning.   Therapy Documentation Precautions:  Precautions Precautions: Fall, Back Precaution Booklet Issued: Yes (comment) Precaution Comments: Pt able to recall 3/3 back precautions Required Braces or Orthoses: Spinal Brace Spinal Brace: Thoracolumbosacral orthotic, Applied in supine position Restrictions Weight Bearing  Restrictions: No Pain: Pain Assessment Pain Assessment: No/denies pain  See FIM for current functional status  Therapy/Group: Individual Therapy  [name], Loriana Samad C 04/22/2014, 7:52 AM

## 2014-04-22 NOTE — Progress Notes (Addendum)
Radiation Oncology         [phone] ________________________________  Name: Carolyn [name] MRN: [id number]  Date: 04/20/2014  DOB: 02-08-1970    Narrative:  The patient was seen for possible radiation treatment. She was admitted with worsening weakening and has undergone surgery with stabilization and debulking of spinal tumor. 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 Also seen was a possible metastasis within the temporal bone.   The patient's case has been discussed in brain/spine tumor board. She is felt to be a good candidate for radiosurgery to the brain metastasis and I discussed this with the patient. She also is felt to be a good candidate for postoperative radiation treatment to the spine. This can be done in a conventional manner and will be given over a period of 2 weeks to try to eradicate residual disease in the surgical bed.                        ALLERGIES:  is allergic to allegra and shellfish allergy.  Meds: Current Facility-Administered Medications  Medication Dose Route Frequency Provider Last Rate Last Dose  . acetaminophen (TYLENOL) tablet 325-650 mg  325-650 mg Oral Q4H PRN Bary Leriche, PA-C      . ALPRAZolam Duanne Moron) tablet 0.25 mg  0.25 mg Oral BID PRN Bary Leriche, PA-C      . alum & mag hydroxide-simeth (MAALOX/MYLANTA) 200-200-20 MG/5ML suspension 30 mL  30 mL Oral Q4H PRN Bary Leriche, PA-C      . alum & mag hydroxide-simeth (MAALOX/MYLANTA) 200-200-20 MG/5ML suspension 30 mL  30 mL Oral Q6H PRN Bary Leriche, PA-C      . bisacodyl (DULCOLAX) suppository 10 mg  10 mg Rectal Daily PRN Bary Leriche, PA-C      . diphenhydrAMINE (BENADRYL) 12.5 MG/5ML elixir 12.5-25 mg  12.5-25 mg Oral Q6H PRN Bary Leriche, PA-C      . guaiFENesin-dextromethorphan (ROBITUSSIN DM) 100-10 MG/5ML syrup 5-10 mL  5-10 mL Oral Q6H PRN Ivan Anchors Love, PA-C      . iron polysaccharides (NIFEREX) capsule 150 mg  150 mg Oral Q1200 Pamela  S Love, PA-C   150 mg at 04/21/14 1137  . menthol-cetylpyridinium (CEPACOL) lozenge 3 mg  1 lozenge Oral PRN Ivan Anchors Love, PA-C       Or  . phenol (CHLORASEPTIC) mouth spray 1 spray  1 spray Mouth/Throat PRN Bary Leriche, PA-C      . methocarbamol (ROBAXIN) tablet 500 mg  500 mg Oral Q6H PRN Bary Leriche, PA-C   500 mg at 04/20/14 2300   Or  . methocarbamol (ROBAXIN) 500 mg in dextrose 5 % 50 mL IVPB  500 mg Intravenous Q6H PRN Bary Leriche, PA-C      . oxyCODONE (Oxy IR/ROXICODONE) immediate release tablet 5-10 mg  5-10 mg Oral Q4H PRN Bary Leriche, PA-C   10 mg at 04/21/14 1737  . OxyCODONE (OXYCONTIN) 12 hr tablet 10 mg  10 mg Oral QHS Ivan Anchors Love, PA-C   10 mg at 04/21/14 2120  . senna-docusate (Senokot-S) tablet 2 tablet  2 tablet Oral QHS Bary Leriche, PA-C   2 tablet at 04/21/14 2120  . traZODone (DESYREL) tablet 25-50 mg  25-50 mg Oral QHS PRN Bary Leriche, PA-C        Physical Findings: The patient is in no acute  distress. Patient is alert and oriented.  height is 5\' 8"  (1.727 m) and weight is 133 lb 9.6 oz (60.6 kg). Her oral temperature is 98.7 F (37.1 C). Her blood pressure is 97/58 and her pulse is 101. Her respiration is 18 and oxygen saturation is 98%. .     Lab Findings: Lab Results  Component Value Date   WBC 5.5 04/21/2014   HGB 10.8* 04/21/2014   HCT 33.5* 04/21/2014   MCV 86.1 04/21/2014   PLT 264 04/21/2014     Radiographic Findings: Dg Thoracic Spine 2 View  04/13/2014   CLINICAL DATA:  T6 through T10 laminectomy and fusion.  EXAM: THORACIC SPINE - 2 VIEW; DG C-ARM 61-120 MIN  COMPARISON:  04/12/2014  FINDINGS: Posterolateral rod and pedicle screw fixation noted at T6-T7-T9-T10, with a cross leakage at the T8 vertebral level but no pedicle screw at the T8 vertebral level. T8 compression fracture observed.  IMPRESSION: 1. Posterolateral rod and pedicle screw fixation at P9-J0-D3-O67, without complicating feature or malpositioning identified. 2. Pathologic  T8 compression fracture.   Electronically Signed   By: Van Clines M.D.   On: 04/13/2014 16:03   Dg Thoracic Spine 2 View  04/13/2014   CLINICAL DATA:  Pathologic compression of the T8 vertebral body with retropulsion. Intraoperative localization.  EXAM: THORACIC SPINE - 2 VIEW  COMPARISON:  MRI of the thoracic spine on 04/12/2014  FINDINGS: Two separate intraoperative films demonstrate needle placement along the lower margin of the T8 vertebral body. The vertebral body is visibly compressed relative to the T7 and T9 levels.  IMPRESSION: Intraoperative localization along the inferior margin of the T8 vertebral body.   Electronically Signed   By: Aletta Edouard M.D.   On: 04/13/2014 13:49   Ct Chest W Contrast  04/15/2014   CLINICAL DATA:  Stage IV carcinoma of right breast. Mastectomy. Radiation therapy chemotherapy. Decompression of thoracic spine and fusion from T6-T10 yesterday. Secondary to epidural tumor.  EXAM: CT CHEST WITH CONTRAST  TECHNIQUE: Multidetector CT imaging of the chest was performed during intravenous contrast administration.  CONTRAST:  3mL OMNIPAQUE IOHEXOL 300 MG/ML  SOLN  COMPARISON:  Thoracic spine MR 04/12/2014.  Chest CT 06/08/2014.  FINDINGS: Mediastinum/Nodes: No supraclavicular adenopathy. Right mastectomy and axillary node dissection. No axillary adenopathy.  Normal heart size, without pericardial effusion. No mediastinal or hilar adenopathy. No internal mammary adenopathy.  Paravertebral soft tissue fullness on image 32 of series 201 at the site of pathologic fracture and epidural tumor.  Lungs/Pleura: Small bilateral pleural effusions. Right apical pleural parenchymal scarring which could be radiation induced.  3 mm right upper lobe pulmonary nodule on image 25 is new.  2 mm right lower lobe pulmonary nodule on image 29 is not identified on the prior exam.  Focus of ground-glass opacity in the left apex on image 15 is nonspecific.  Upper abdomen: 7 mm hypo attenuating  right liver lobe lesion on image 53 was present on the prior exam and favored to represent a cyst. Too small to characterize lesions more inferiorly in the right lobe are unchanged. 2 too small to characterize lateral segment left liver lobe lesions which are not readily apparent on the prior exam.  Normal imaged portions of the spleen, stomach, adrenal glands, kidneys, pancreas. Cholecystectomy. Common duct is within normal limits, 9 mm.  Musculoskeletal: Postoperative edema and gas within the perivertebral space. Spine fixation at T6 through T10. The left-sided screws extend anterior to the vertebral bodies. Metastatic disease is identified throughout  the thoracic spine, including in the right-sided T12 on sagittal images 63 and 64.  IMPRESSION: 1. Tiny pulmonary nodules, likely new since the prior exam. Cannot exclude early pulmonary metastasis. Recommend attention on follow-up. 2. Too small to characterize liver lesions. Some are new. Early metastatic disease cannot be excluded. If further imaging characterization is desired, pre and post contrast abdominal MRI may be informative. 3. Osseous metastasis throughout the thoracic spine with T8 compression fracture and interval fixation at T6-10. 4. Small bilateral pleural effusions. 5. Nonspecific ground-glass opacity at the left apex. Correlate with infectious symptoms. Recommend attention on follow-up.   Electronically Signed   By: Abigail Miyamoto M.D.   On: 04/15/2014 10:29   Mr Jeri Cos KD Contrast  04/21/2014   CLINICAL DATA:  Metastatic breast cancer  EXAM: MRI HEAD WITHOUT AND WITH CONTRAST  TECHNIQUE: Multiplanar, multiecho pulse sequences of the brain and surrounding structures were obtained without and with intravenous contrast.  CONTRAST:  56mL MULTIHANCE GADOBENATE DIMEGLUMINE 529 MG/ML IV SOLN  COMPARISON:  MRI head 04/14/2014  FINDINGS: 11 mm enhancing mass in the left posterior centrum semiovale shows interval enlargement since the prior study when it  measured approximately 7 mm. Moderate surrounding vasogenic edema is similar. Mild hemorrhage is more prominent on the current study due to higher field strength 3 Tesla imaging. No other intra-axial enhancing mass lesions identified in the brain  Enhancing mass lesion in the left medial mastoid portion of the temporal bone measures approximately 15 x 9 mm. This is unusual in location for metastatic disease but may represent bony metastatic disease. This is unchanged from the prior study.  Patchy signal abnormality in the upper cervical spine could represent metastatic disease. Heterogeneous signal in the clivus and calvarium could represent metastatic disease however it does not show significant focal enhancement suggesting tumor.  IMPRESSION: 11 mm metastatic deposit left posterior centrum semiovale shows interval enlargement from the prior study. This is a hemorrhagic lesion with surrounding white matter edema  Enhancing mass in the left mastoid segment of the temporal bone, worrisome for bony metastatic disease. Possible other bony metastatic disease including the cervical spine and calvarium.   Electronically Signed   By: Franchot Gallo M.D.   On: 04/21/2014 15:15   Mr Jeri Cos XI Contrast  04/14/2014   CLINICAL DATA:  Metastatic breast cancer. Leg weakness from pathologic compression deformity and epidural tumor, with recent stabilization. Incidental brain edema discovered on spinal MR screening.  EXAM: MRI HEAD WITHOUT AND WITH CONTRAST  TECHNIQUE: Multiplanar, multiecho pulse sequences of the brain and surrounding structures were obtained without and with intravenous contrast.  CONTRAST:  MultiHance 12 mL.  COMPARISON:  Survey images from MRI thoracic spine 04/12/2014.  FINDINGS: There is no acute stroke or hemorrhage. No hydrocephalus or extra-axial fluid.  There is a 7 mm LEFT centrum semiovale brain metastasis, having reduced signal on T2 weighted images, no restriction of diffusion, and displaying mild  susceptibility on gradient sequence. Avid postcontrast enhancement. No other definite metastases.  In the LEFT medial mastoid portion of the temporal bone, there is an asymmetric T2 hyperintense lesion 9 x 14 mm cross-section which shows mild postcontrast enhancement. This would be an unusual location for metastatic disease but this is not excluded given the osseous disease elsewhere. Temporal bone inflammatory process is also possible. Recommend CT of the temporal bones with contrast for further evaluation.  Mild disease of the clivus is suspected in its superior aspect. This is questioned on precontrast T1 weighted  images inferior and posterior to the sella. Heterogeneity of calvarial bone marrow suggest osseous diploic involvement is possible.  No sinus or orbital disease. Negative RIGHT temporal bone. Major dural venous sinuses are patent.  IMPRESSION: 7 mm apparently solitary intra-axial intracranial metastasis with marked surrounding edema and mild susceptibility suggesting hemorrhage or calcification.  Unusual LEFT medial mastoid temporal bone enhancing lesion 9 x 14 mm cross-section. Consider CT temporal bone with contrast for further evaluation.  Metastatic disease to the clivus and calvarium not excluded. See discussion above.   Electronically Signed   By: Rolla Flatten M.D.   On: 04/14/2014 11:54   Mr Thoracic Spine W Wo Contrast  04/12/2014   ADDENDUM REPORT: 04/12/2014 21:27  ADDENDUM: Subsequently noted on the whole body localizer 3 plane screening series #3 is asymmetric edema in the LEFT frontoparietal lobe concerning for a brain metastasis. MRI brain without and with contrast is recommended for further evaluation. Findings discussed with ordering provider.   Electronically Signed   By: Rolla Flatten M.D.   On: 04/12/2014 21:27   04/12/2014   CLINICAL DATA:  History of breast cancer with radiation and chemotherapy in 2013. Lower extremity weakness and difficulty bearing weight with back pain  beginning a few weeks ago. Initial encounter.  EXAM: MRI THORACIC AND LUMBAR SPINE WITHOUT AND WITH CONTRAST  TECHNIQUE: Multiplanar and multiecho pulse sequences of the thoracic and lumbar spine were obtained without and with intravenous contrast.  CONTRAST:  27mL MULTIHANCE GADOBENATE DIMEGLUMINE 529 MG/ML IV SOLN  COMPARISON:  None.  FINDINGS: MR THORACIC SPINE FINDINGS  Numbering of the thoracic spine was accomplished by counting down from the odontoid. There was an initial problem with the surface coil which was replaced mid way through the exam.  Widespread osseous metastatic disease is present. There is a pathologic compression fracture at T8 with slight retropulsion. Significant tumor involvement of the T7 and T9 vertebrae as well. Loss of approximately 50% vertebral body height at T8 is noted. Posterior element involvement is prominent at T8. Epidural tumor, along with retropulsed pathologic bone, extends into the spinal canal at T8, greater on the RIGHT, with significant ventral mass effect on the cord. Canal AP diameter is severely narrowed approximately 5 mm. Slight abnormal cord signal suspected at the T8 level on STIR imaging. The exiting foraminal nerve roots at T7-8 and T8-9 bilaterally are significantly affected, worse on the RIGHT.  No other areas of epidural tumor, but foci of metastatic disease are seen elsewhere in the thoracic spine including T6-7 posterior elements and T12 vertebral body most notably.  MR LUMBAR SPINE FINDINGS  No pathologic compression fracture, but widespread osseous involvement throughout the lumbar spine. The largest tumor deposit is seen in the L5 vertebral body anteriorly. Minimal superior endplate depression at L1. No epidural tumor. Small foci of sacral involvement. Normal conus and cauda equina nerve roots. No paravertebral masses.  IMPRESSION: Pathologic compression fracture T8. Retropulsed bone and epidural tumor result in cord compression at this level. Significant  tumor involvement at T7 and T9 without retropulsion.  Widespread osseous disease throughout the thoracic and lumbar spine, but no significant epidural tumor elsewhere or pathologic compression.  Findings discussed with ordering provider by myself at the time of interpretation.  Electronically Signed: By: Rolla Flatten M.D. On: 04/12/2014 20:57   Mr Lumbar Spine W Wo Contrast (assess For Abscess, Cord Compression)  04/12/2014   ADDENDUM REPORT: 04/12/2014 21:27  ADDENDUM: Subsequently noted on the whole body localizer 3 plane screening series #3  is asymmetric edema in the LEFT frontoparietal lobe concerning for a brain metastasis. MRI brain without and with contrast is recommended for further evaluation. Findings discussed with ordering provider.   Electronically Signed   By: Rolla Flatten M.D.   On: 04/12/2014 21:27   04/12/2014   CLINICAL DATA:  History of breast cancer with radiation and chemotherapy in 2013. Lower extremity weakness and difficulty bearing weight with back pain beginning a few weeks ago. Initial encounter.  EXAM: MRI THORACIC AND LUMBAR SPINE WITHOUT AND WITH CONTRAST  TECHNIQUE: Multiplanar and multiecho pulse sequences of the thoracic and lumbar spine were obtained without and with intravenous contrast.  CONTRAST:  45mL MULTIHANCE GADOBENATE DIMEGLUMINE 529 MG/ML IV SOLN  COMPARISON:  None.  FINDINGS: MR THORACIC SPINE FINDINGS  Numbering of the thoracic spine was accomplished by counting down from the odontoid. There was an initial problem with the surface coil which was replaced mid way through the exam.  Widespread osseous metastatic disease is present. There is a pathologic compression fracture at T8 with slight retropulsion. Significant tumor involvement of the T7 and T9 vertebrae as well. Loss of approximately 50% vertebral body height at T8 is noted. Posterior element involvement is prominent at T8. Epidural tumor, along with retropulsed pathologic bone, extends into the spinal canal at  T8, greater on the RIGHT, with significant ventral mass effect on the cord. Canal AP diameter is severely narrowed approximately 5 mm. Slight abnormal cord signal suspected at the T8 level on STIR imaging. The exiting foraminal nerve roots at T7-8 and T8-9 bilaterally are significantly affected, worse on the RIGHT.  No other areas of epidural tumor, but foci of metastatic disease are seen elsewhere in the thoracic spine including T6-7 posterior elements and T12 vertebral body most notably.  MR LUMBAR SPINE FINDINGS  No pathologic compression fracture, but widespread osseous involvement throughout the lumbar spine. The largest tumor deposit is seen in the L5 vertebral body anteriorly. Minimal superior endplate depression at L1. No epidural tumor. Small foci of sacral involvement. Normal conus and cauda equina nerve roots. No paravertebral masses.  IMPRESSION: Pathologic compression fracture T8. Retropulsed bone and epidural tumor result in cord compression at this level. Significant tumor involvement at T7 and T9 without retropulsion.  Widespread osseous disease throughout the thoracic and lumbar spine, but no significant epidural tumor elsewhere or pathologic compression.  Findings discussed with ordering provider by myself at the time of interpretation.  Electronically Signed: By: Rolla Flatten M.D. On: 04/12/2014 20:57   Nm Bone Scan Whole Body  04/15/2014   CLINICAL DATA:  History of breast cancer, stage IV.  EXAM: NUCLEAR MEDICINE WHOLE BODY BONE SCAN  TECHNIQUE: Whole body anterior and posterior images were obtained approximately 3 hours after intravenous injection of radiopharmaceutical.  RADIOPHARMACEUTICALS:  25 mCi Technetium-99 MDP  COMPARISON:  Chest CT 04/15/2014  FINDINGS: Soft tissue activity and urinary activity is present - no superscan. At the time of imaging, renal activity has largely washed out.  There is a mid thoracic spine photopenic area correlating with large T8 metastasis. Spinal  metastases throughout the thoracic and lumbar spine that are known from recent MRI and CT are not clearly visible.  IMPRESSION: 1. Osseous metastatic disease known by CT and MRI is significantly underestimated by bone scan - likely due to predominantly lytic nature. 2. T8 photopenia related to large lytic metastasis and surgical defect.   Electronically Signed   By: Monte Fantasia M.D.   On: 04/15/2014 17:13   Dg  C-arm 61-120 Min  04/13/2014   CLINICAL DATA:  T6 through T10 laminectomy and fusion.  EXAM: THORACIC SPINE - 2 VIEW; DG C-ARM 61-120 MIN  COMPARISON:  04/12/2014  FINDINGS: Posterolateral rod and pedicle screw fixation noted at T6-T7-T9-T10, with a cross leakage at the T8 vertebral level but no pedicle screw at the T8 vertebral level. T8 compression fracture observed.  IMPRESSION: 1. Posterolateral rod and pedicle screw fixation at E1-E0-F1-Q19, without complicating feature or malpositioning identified. 2. Pathologic T8 compression fracture.   Electronically Signed   By: Van Clines M.D.   On: 04/13/2014 16:03    Impression/plan :    The patient is a good candidate for postoperative radiation treatment to the spine as well as radiosurgery to a solitary brain metastasis. She has proceeded with a SRS protocol MRI scan of the brain. She also will be scheduled for a simulation in our department at [facility] such that we can proceed with radiation planning. All of this was discussed with the patient. Her questions were answered and she does wish to proceed with this treatment plan.   -The patient has edema surrounding the solitary metastasis. I do not see that the patient is currently on steroids. If not, I would recommend beginning Decadron 4 mg 4 times a day and this can be tapered after she proceeds with radiosurgery.  ------------------------------------------------  [name], MD, PhD

## 2014-04-22 NOTE — Progress Notes (Signed)
Patient brought to Ct Sim room, via [facility],  requested ativan "I'm claustrophobic" order fro 0.5mg  ativan sl x 1 per Dr. Kyung Rudd, verbal and written orders given,  Asked patient name and dob as identification, patien thas IV site LAC, intact, no redness, 0925 0.5mg  ativan sl x 1  given to patient , pain is mild stated I took pain med before coming" 9:28 AM

## 2014-04-22 NOTE — Progress Notes (Signed)
Occupational Therapy Session Note  Patient Details  Name: Carolyn [name] MRN: [id number] Date of Birth: [birth year]  Today's Date: 04/22/2014 OT Individual Time: 1300-1415 OT Individual Time Calculation (min): 75 min    Short Term Goals: Week 1:  OT Short Term Goal 1 (Week 1): STG=LTG due to short LOS  Skilled Therapeutic Interventions/Progress Updates:    Pt seen for OT session focusing on functional mobility, functional balance, and functional activity tolerance. Pt in supine upon arrival finishing lunch.Upon entering pt voiced need to complete toileting task urgently. Pt transferred supine> EOB with HOB raised and TLSo donned with assist. Pt ambulated with RW to toilet with min A with decreased balanace noted when ambulating quickly. Pt transferred onto toilet with min A.  She completed hygiene via lateral leans with supervision. Pt ambulated to sink where she completed hand washing task with steadying assist. In w/[name], pt self-propeled to pt laundry area ~50 yards. Pt educated regarding modified/ adpapted laundry techniques including education regarding lifting and back precautions including  Using small laundry detergent bottles, using reacher to access washer and dryer, golfer pick-up technique as upgrade when balance progresses and using cart/ rolling chair to transport laundry. Pt then self-propelled to ADL apartment where she completed vaccuuming IADL task. Pt first practiced walking with RW using 1 UE and then upgrading to using vacuum while walking with RW with min A.  Pt then completed bed mobility on raised standard bed with supervision and VCs. Education provided regarding log rolling technique, and donning/doffing brace while seated EOB. Education provided regarding proper fitting and positioning of brace. Pt then ambulated to therapy gym with RW. In gym, pt completed standing horseshoe toss game. Multiple trials completed in standing with min A for steadying demonstrating functional static  dynamic balance. Pt completed trials with staggered step length and required to reach in various planes to obtain horse shoe. Pt educated regarding using squating/ bending of knees to obtain low objects vs. Bending at the back. At end of session, pt ambulated back to room with w/[name] as stabilizer with min steadying assist. Pt returned to EOB with min A, TLSO brace doffed and pt returned to supine with supervision. Pt left in supine with all needs in reach and NT present.    Education provided regarding energy conservation, need for assist, community mobility, spinal precautions with functional tasks, POC , and d/[name] planning.  Therapy Documentation Precautions: Precautions Precautions: Fall, Back Precaution Booklet Issued: Yes (comment) Precaution Comments: Pt able to recall 3/3 back precautions Required Braces or Orthoses: Spinal Brace Spinal Brace: Thoracolumbosacral orthotic, Applied in supine position Restrictions Weight Bearing Restrictions: No Pain: Pain Assessment Pain Assessment: No/denies pain  See FIM for current functional status  Therapy/Group: Individual Therapy  Carolyn [name] 04/22/2014, 1:04 PM

## 2014-04-22 NOTE — Progress Notes (Signed)
IP Rehab social worker - [place]  [phone]

## 2014-04-22 NOTE — Progress Notes (Signed)
Occupational Therapy Session Note  Patient Details  Name: Carolyn [name] MRN: [id number] Date of Birth: 1970-04-13  Today's Date: 04/22/2014 OT Individual Time: 1430-1500 OT Individual Time Calculation (min): 30 min    Short Term Goals: Week 1:  OT Short Term Goal 1 (Week 1): STG=LTG due to short LOS  Skilled Therapeutic Interventions/Progress Updates:    1:1 focus on bed mobility from flat bed, ability to don brace with setup and extra time, functional ambulation with increased distance, activity tolerance/ endurance and functional mobility around kitchen setting.  Pt able to ambulated from her room to the ADL apartment, perform tasks in ADL apartment and return to room with one one seated break.  Pt demonstrated functional ambulation with steady A to close supervision with RW. Pt able to retrieve items from upper cabinets, pantry and fridge. Problem solved organization of kitchen to retrieve items while maintaining back precautions and how to transport items in the kitchen.  Pt reported sister going to help with kitchen tasks.  Pt able to get on and off couch with extra time, supervision for back prec off lower surfaces. Returned to room and into bed; RN came in and brought pain meds for back discomfort.  Therapy Documentation Precautions:  Precautions Precautions: Fall, Back Precaution Booklet Issued: Yes (comment) Precaution Comments: Pt able to recall 3/3 back precautions Required Braces or Orthoses: Spinal Brace Spinal Brace: Thoracolumbosacral orthotic, Applied in supine position Restrictions Weight Bearing Restrictions: No Pain: Pain Assessment Pain Assessment: 0-10 Pain Score: 3  Pain Type: Acute pain Pain Location: Back Pain Orientation: Upper Pain Descriptors / Indicators: Aching Pain Frequency: Intermittent Pain Onset: With Activity Patients Stated Pain Goal: 3 Pain Intervention(s): Medication (See eMAR)  See FIM for current functional status  Therapy/Group:  Individual Therapy  Willeen Cass [facility] 04/22/2014, 3:49 PM

## 2014-04-22 NOTE — Progress Notes (Signed)
Physical Therapy Session Note  Patient Details  Name: Carolyn [name] MRN: [id number] Date of Birth: 1970/02/13  Today's Date: 04/22/2014 PT Individual Time: 2831-5176 PT Individual Time Calculation (min): 60 min   Short Term Goals: Week 1:  PT Short Term Goal 1 (Week 1): = LTGs  Skilled Therapeutic Interventions/Progress Updates:    Pt received supine in bed, agreeable to participate in therapy. Session focused on balance, gait. Pt able to recall 3/3 back precautions and roll with mod (I) for therapist to don TLSO in supine. Pt moved supine>sit w/ SBA and min cueing for maintaining precautions. Pt ambulated w/ RW and Min Guard A 150' to rehab gym. To challenge dynamic balance pt ambulated w/ no AD w/ MinA 100'. Instructed pt in x10 each step ups onto 6" step with foam mat on top to strengthen BLE and challenge balance, pt completed w/ heavy Min HHA. Instructed pt in x10 each cone taps. Pt educated on modifying exercises at home to target balance vs strength vs endurance. Instructed pt in dual motor task activity of walking while dribbling basketball, pt completed 100' w/ MinA at decreased gait speed. Instructed pt in tandem stance w/ min alternating isometrics, pt with 2x LOB requiring stepping strategy to correct (Min physical assist). Instructed pt in ascending/descending 12 stairs w/ 2 rails and Min Guard A. Pt tolerated session well. Session ended in pt's room, where pt was left supine in bed w/ all needs within reach.    Therapy Documentation Precautions:  Precautions Precautions: Fall, Back Precaution Booklet Issued: Yes (comment) Precaution Comments: Pt able to recall 3/3 back precautions Required Braces or Orthoses: Spinal Brace Spinal Brace: Thoracolumbosacral orthotic, Applied in supine position Restrictions Weight Bearing Restrictions: No General:   Vital Signs: Therapy Vitals Temp: 98.7 F (37.1 C) Temp Source: Oral Pulse Rate: (!) 101 Resp: 18 BP: (!) 97/58 mmHg Patient  Position (if appropriate): Lying Oxygen Therapy SpO2: 98 % O2 Device: Not Delivered Pain: Pain Assessment Pain Assessment: 0-10 Pain Score: 0-No pain   See FIM for current functional status  Therapy/Group: Individual Therapy  Rada Hay  Rada Hay, PT, DPT 04/22/2014, 7:47 AM

## 2014-04-23 ENCOUNTER — Inpatient Hospital Stay (HOSPITAL_COMMUNITY): Payer: BC Managed Care – PPO | Admitting: Occupational Therapy

## 2014-04-23 ENCOUNTER — Inpatient Hospital Stay (HOSPITAL_COMMUNITY): Payer: BC Managed Care – PPO

## 2014-04-23 DIAGNOSIS — C7931 Secondary malignant neoplasm of brain: Secondary | ICD-10-CM

## 2014-04-23 DIAGNOSIS — C50919 Malignant neoplasm of unspecified site of unspecified female breast: Secondary | ICD-10-CM

## 2014-04-23 NOTE — Progress Notes (Signed)
[place] PHYSICAL MEDICINE & REHABILITATION     PROGRESS NOTE    Subjective/Complaints: No complaints this am. Feels that things went well yesterday.   Objective: Vital Signs: Blood pressure 98/60, pulse 88, temperature 98.6 F (37 C), temperature source Oral, resp. rate 18, height 5\' 8"  (1.727 m), weight 60.6 kg (133 lb 9.6 oz), SpO2 99 %. [name] Wo Contrast  04/21/2014   CLINICAL DATA:  Metastatic breast cancer  EXAM: MRI HEAD WITHOUT AND WITH CONTRAST  TECHNIQUE: Multiplanar, multiecho pulse sequences of the brain and surrounding structures were obtained without and with intravenous contrast.  CONTRAST:  57mL MULTIHANCE GADOBENATE DIMEGLUMINE 529 MG/ML IV SOLN  COMPARISON:  MRI head 04/14/2014  FINDINGS: 11 mm enhancing mass in the left posterior centrum semiovale shows interval enlargement since the prior study when it measured approximately 7 mm. Moderate surrounding vasogenic edema is similar. Mild hemorrhage is more prominent on the current study due to higher field strength 3 Tesla imaging. No other intra-axial enhancing mass lesions identified in the brain  Enhancing mass lesion in the left medial mastoid portion of the temporal bone measures approximately 15 x 9 mm. This is unusual in location for metastatic disease but may represent bony metastatic disease. This is unchanged from the prior study.  Patchy signal abnormality in the upper cervical spine could represent metastatic disease. Heterogeneous signal in the clivus and calvarium could represent metastatic disease however it does not show significant focal enhancement suggesting tumor.  IMPRESSION: 11 mm metastatic deposit left posterior centrum semiovale shows interval enlargement from the prior study. This is a hemorrhagic lesion with surrounding white matter edema  Enhancing mass in the left mastoid segment of the temporal bone, worrisome for bony metastatic disease. Possible other bony metastatic disease including the cervical  spine and calvarium.   Electronically Signed   By: Franchot Gallo M.D.   On: 04/21/2014 15:15    Recent Labs  04/21/14 0910  WBC 5.5  HGB 10.8*  HCT 33.5*  PLT 264    Recent Labs  04/21/14 0910  NA 141  K 3.6  CL 103  GLUCOSE 133*  BUN 9  CREATININE 0.77  CALCIUM 9.5   CBG (last 3)   Recent Labs  04/22/14 0842  GLUCAP 108*    Wt Readings from Last 3 Encounters:  04/20/14 60.6 kg (133 lb 9.6 oz)  04/13/14 58.5 kg (128 lb 15.5 oz)  02/28/14 60 kg (132 lb 4.4 oz)    Physical Exam:  Constitutional: She is oriented to person, place, and time. She appears well-developed and well-nourished.  HENT:  Head: Normocephalic and atraumatic.  Eyes: Conjunctivae are normal. Pupils are equal, round, and reactive to light.  Neck: Normal range of motion. Neck supple.  Cardiovascular: Regular rhythm. Tachycardia present.  Respiratory: Effort normal and breath sounds normal. No respiratory distress. She has no wheezes.  GI: Soft. Bowel sounds are normal. She exhibits no distension. There is no tenderness.  Musculoskeletal: She exhibits no edema.  Neurological: She is alert and oriented to person, place, and time. No cranial nerve deficit.  Speech clear. Follows commands without difficulty. RUE weakness proximally with decreased ROM at shoulder. BLE with proximal weakness and instability. Mild sensory deficits LLE: 1+/2 to LT,PP, proprioception. Strength 4/5 ue's prox to distal with some limitations due to pain. LE: 3+ hf, 4 ke and 4+ both ankles. BLE with 3+ DTR.  Skin: Skin is warm and dry.  back incision clean and dry.  Psychiatric: She has  a normal mood and affect. Her behavior is normal. Judgment and thought content normal.    Assessment/Plan: 1. 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 Physiatrist is providing close team  supervision and 24 hour management of active medical problems listed below. Physiatrist and rehab team continue to assess barriers to discharge/monitor patient progress toward functional and medical goals. FIM: FIM - Bathing Bathing Steps Patient Completed: Right lower leg (including foot), Left lower leg (including foot), Chest, Right Arm, Left Arm, Abdomen, Front perineal area, Buttocks, Right upper leg, Left upper leg Bathing: 5: Supervision: Safety issues/verbal cues  FIM - Upper Body Dressing/Undressing Upper body dressing/undressing steps patient completed: Thread/unthread right sleeve of pullover shirt/dresss, Thread/unthread left sleeve of pullover shirt/dress, Put head through opening of pull over shirt/dress, Pull shirt over trunk Upper body dressing/undressing: 5: Set-up assist to: Apply TLSO, cervical collar FIM - Lower Body Dressing/Undressing Lower body dressing/undressing steps patient completed: Thread/unthread left pants leg, Thread/unthread right pants leg, Pull pants up/down Lower body dressing/undressing: 4: Steadying Assist  FIM - Toileting Toileting steps completed by patient: Adjust clothing prior to toileting, Performs perineal hygiene, Adjust clothing after toileting Toileting Assistive Devices: Grab bar or rail for support Toileting: 5: Supervision: Safety issues/verbal cues  FIM - Radio producer Devices: Grab bars Toilet Transfers: 4-To toilet/BSC: Min A (steadying Pt. > 75%), 5-From toilet/BSC: Supervision (verbal cues/safety issues)  FIM - Control and instrumentation engineer Devices: Orthosis Bed/Chair Transfer: 5: Supine > Sit: Supervision (verbal cues/safety issues), 5: Bed > Chair or W/C: Supervision (verbal cues/safety issues)  FIM - Locomotion: Wheelchair Locomotion: Wheelchair: 0: Activity did not occur FIM - Locomotion: Ambulation Ambulation/Gait Assistance: 4: Min assist Locomotion: Ambulation: 4: Travels 150  ft or more with minimal assistance (Pt.>75%)  Comprehension Comprehension Mode: Auditory Comprehension: 7-Follows complex conversation/direction: With no assist  Expression Expression Mode: Verbal Expression: 7-Expresses complex ideas: With no assist  Social Interaction Social Interaction: 6-Interacts appropriately with others with medication or extra time (anti-anxiety, antidepressant).  Problem Solving Problem Solving: 7-Solves complex problems: Recognizes & self-corrects  Memory Memory: 7-Complete Independence: No helper Medical Problem List and Plan: 1. 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 Onc/rad-onc following  -decadron ordered  -pt simulated yesterday---radiation to follow (next week?) 2. DVT Prophylaxis/Anticoagulation: Mechanical: Sequential compression devices, below knee Bilateral lower extremities 3. Pain Management:   OxyContin at bedtime to help with more consistent pain management. Continue oxy IR prn breakthrough pain.   4. Mood: She is reporting some anxiety about current situation.  low dose xanax prn. Team to provide ego support. Has good support system--family and her church. LCSW to follow for evaluation and support. consider neuropsych eval 5. Neuropsych: This patient is capable of making decisions on his own behalf. 6. Skin/Wound Care: Routine pressure relief measures.  7. Fluids/Electrolytes/Nutrition: Monitor I/O. Check follow up labs as available. Offer snacks bid to augment intake.  8. ABLA: Improved after transfusion.  Added iron supplement.  9. Constipation: Continue Senna S 10. Hypokalemia:  improved  LOS (Days) 3 A FACE TO FACE EVALUATION WAS PERFORMED  SWARTZ,ZACHARY T 04/23/2014 7:52 AM

## 2014-04-23 NOTE — IPOC Note (Signed)
Overall Plan of Care [facility]) Patient Details Name: Carolyn [name] MRN: [id number] DOB: 30-Dec-1970  Admitting Diagnosis: spinal myelopathy  Hospital Problems: Principal Problem:   Myelopathy Active Problems:   Breast cancer of upper-outer quadrant of right female breast   Pathologic fracture of thoracic vertebrae   Metastatic cancer to spine     Functional Problem List: Nursing Endurance, Medication Management, Pain, Skin Integrity  PT Balance, Endurance, Motor, Pain, Sensory, Skin Integrity  OT Balance, Endurance, Motor, Safety, Sensory  SLP    TR         Basic ADL's: OT Bathing, Dressing, Toileting     Advanced  ADL's: OT Simple Meal Preparation, Laundry, Light Housekeeping     Transfers: PT Bed Mobility, Bed to Chair, Car, Manufacturing systems engineer, Metallurgist: PT Ambulation, Emergency planning/management officer, Stairs     Additional Impairments: OT    SLP        TR      Anticipated Outcomes Item Anticipated Outcome  Self Feeding Mod I  Swallowing      Basic self-care  Mod I  Insurance underwriter Transfers Supervision  Bowel/Bladder  Mod I  Transfers  S overall  Locomotion  S gait; mod I w/c mobility; min A stairs  Communication     Cognition     Pain  < 4  Safety/Judgment  Mod I   Therapy Plan: PT Intensity: Minimum of 1-2 x/day ,45 to 90 minutes PT Frequency: 5 out of 7 days PT Duration Estimated Length of Stay: 5-7 days OT Intensity: Minimum of 1-2 x/day, 45 to 90 minutes OT Frequency: 5 out of 7 days OT Duration/Estimated Length of Stay: 3-4 days         Team Interventions: Nursing Interventions Patient/Family Education, Pain Management, Disease Management/Prevention, Medication Management, Skin Care/Wound Management  PT interventions Ambulation/gait training, Training and development officer, Community reintegration, Discharge planning, Disease management/prevention, DME/adaptive equipment instruction, Functional mobility  training, Neuromuscular re-education, Pain management, Patient/family education, Psychosocial support, Skin care/wound management, Splinting/orthotics, Stair training, Therapeutic Activities, Therapeutic Exercise, UE/LE Strength taining/ROM, UE/LE Coordination activities, Wheelchair propulsion/positioning  OT Interventions Training and development officer, Discharge planning, Community reintegration, Engineer, drilling, Barrister's clerk education, Psychosocial support, Self Care/advanced ADL retraining, Therapeutic Activities, Therapeutic Exercise, UE/LE Strength taining/ROM, UE/LE Coordination activities  SLP Interventions    TR Interventions    SW/CM Interventions      Team Discharge Planning: Destination: PT-Home (sister's home) ,OT- Home , SLP-  Projected Follow-up: PT-Home health PT, 24 hour supervision/assistance, OT-  Home health OT, SLP-  Projected Equipment Needs: PT-Rolling walker with 5" wheels, Wheelchair (measurements), Wheelchair cushion (measurements), OT- Tub/shower bench, SLP-  Equipment Details: PT- , OT-  Patient/family involved in discharge planning: PT- Patient,  OT- , SLP-   MD ELOS: 6 days Medical Rehab Prognosis:  Excellent Assessment: The patient has been admitted for CIR therapies with the diagnosis of metastatic breast cancer with myelopathy. The team will be addressing functional mobility, strength, stamina, balance, safety, adaptive techniques and equipment, self-care, bowel and bladder mgt, patient and caregiver education, NMR, pain mgt, onc mgt, ego support, activity tolerance. Goals have been set at mod I to supervision.    Meredith Staggers, MD, FAAPMR      See Team Conference Notes for weekly updates to the plan of care

## 2014-04-23 NOTE — Progress Notes (Signed)
Social Work  Social Work Assessment and Plan  Patient Details  Name: Carolyn [name] MRN: [id number] Date of Birth: 10/03/1970  Today's Date: 04/23/2014  Problem List:  Patient Active Problem List   Diagnosis Date Noted  . 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 Malnutrition of moderate degree 04/13/2014  . Myelopathy 04/12/2014  . Pathologic fracture of thoracic vertebrae 04/12/2014  . Biliary colic 76/19/5093  . Breast cancer of upper-outer quadrant of right female breast 09/24/2013  . S/P total hysterectomy and bilateral salpingo-oophorectomy 12/23/2012  . History of chemotherapy   . Hx of radiation therapy    Past Medical History:  Past Medical History  Diagnosis Date  . 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 Hx of radiation therapy 11/08/11 -12/26/11    right chest wall/supraclav fossa, right scar  . History of chemotherapy 06/27/11 -09/04/11    neoadjuvant   Past Surgical History:  Past Surgical History  Procedure Laterality Date  . Wisdom tooth extraction    . Portacath placement  06/20/2011    Procedure: INSERTION PORT-A-CATH;  Surgeon: Haywood Lasso, MD;  Location: [place];  Service: General;  Laterality: N/A;  . Modified mastectomy  10/03/2011    Procedure: MODIFIED MASTECTOMY;  Surgeon: Haywood Lasso, MD;  Location: [place];  Service: General;  Laterality: Right;  . Port-a-cath removal  01/30/2012    Procedure: MINOR REMOVAL PORT-A-CATH;  Surgeon: Haywood Lasso, MD;  Location: [place];  Service: General;  Laterality: Left;  . Breast biopsy      Left  . Robotic assisted total hysterectomy with bilateral salpingo oopherectomy Bilateral 12/23/2012    Procedure: ROBOTIC ASSISTED TOTAL HYSTERECTOMY WITH BILATERAL SALPINGO OOPHORECTOMY;  Surgeon: Marvene Staff, MD;  Location: [facility];  Service: Gynecology;  Laterality: Bilateral;  . Cholecystectomy N/A 03/01/2014   Procedure: LAPAROSCOPIC CHOLECYSTECTOMY;  Surgeon: Leighton Ruff, MD;  Location: [facility];  Service: General;  Laterality: N/A;  . Laminectomy N/A 04/13/2014    Procedure: THORACIC LAMINECTOMY WITH FIXATION THORACIC SIX-THORACIC TEN FUSION;  Surgeon: Kristeen Miss, MD;  Location: [place] [facility];  Service: Neurosurgery;  Laterality: N/A;   Social History:  reports that she has never smoked. She has never used smokeless tobacco. She reports that she does not drink alcohol or use illicit drugs.  Family / Support Systems Marital Status: Single Other Supports: sister, Carolyn [name] [name]) @ [phone] and nephew Ability/Limitations of Caregiver: Sister works 6 am to 4 pm; nephew works PT. 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  Enjoys her job. Legal Hisotry/Current Legal Issues: None Guardian/Conservator: None - per MD, pt capable of making decisions on her own behalf   Abuse/Neglect Physical Abuse: Denies Verbal Abuse: Denies Sexual Abuse: Denies Exploitation of patient/patient's resources: Denies Self-Neglect: Denies  Emotional Status Pt's affect, behavior adn adjustment status: Pt very pleasant, talkative and completes interview assessment without difficulty.  Talks easily about her diagnosis and denies any current s/s of significant emotional distress.  Admits general concerns about the physical and cognitive effects her  upcoming treatments might cause.  Was consulted by neuropsych today. Recent Psychosocial Issues: Breast Ca  2013;  none more recent Pyschiatric History: None Substance Abuse History: None  Patient / Family Perceptions, Expectations & Goals Pt/Family understanding of illness & functional limitations: Pt and family with good understanding of the extent of her mets and of her current functional deficits/ need for CIR. Premorbid pt/family roles/activities: Pt was completely independent and working f/t.   Anticipated changes in roles/activities/participation: Pt with mod i goals, therefore, do not anticipate much role change.  Sister and nephew to provide assist as needed. Pt/family expectations/goals: "I want to be able to do as much for myself as possible."  [employer]: None Premorbid Home Care/DME Agencies: None Transportation available at discharge: yes Resource referrals recommended: Neuropsychology, Support group (specify), Advocacy groups  Discharge Planning Living Arrangements: Alone Support Systems: Other relatives, Water engineer, Social worker community Type of Residence: Private residence Insurance Resources: Multimedia programmer (specify) Printmaker) Financial Resources: Employment Financial Screen Referred: No Living Expenses: Education officer, community Management: Patient Does the patient have any problems obtaining your medications?: No Home Management: pt Patient/Family Preliminary Plans: Pt plans to d/c to sister's home where close to 24/7 care can be provided. Social Work Anticipated Follow Up Needs: HH/OP, Support Group Expected length of stay: 7 days  Clinical Impression Unfortunate woman here following finding of metastatic cancer to her spine.  Does have excellent support of sisters and plans to stay with local sister initially upon d/c.  Making quick progress with txs and anticipate short LOS.  Will follow for support and d/c planning needs.  Chianti Goh 04/23/2014, 3:10 PM

## 2014-04-23 NOTE — Progress Notes (Signed)
Physical Therapy Session Note  Patient Details  Name: Carolyn [name] MRN: [id number] Date of Birth: Feb 12, 1970  Today's Date: 04/23/2014 PT Individual Time: 0900-1000 PT Individual Time Calculation (min): 60 min   Short Term Goals: Week 1:  PT Short Term Goal 1 (Week 1): = LTGs  Skilled Therapeutic Interventions/Progress Updates:   Gait with RW to and from therapy gym with close S with a few episodes of intermittent steady A during turns demonstrating improved control and coordination in BLE. Administered Normal Tug and 5 time sit to stand tests for balance and fall risk assessment: see results below. Discussed results with patient who verbalized understanding. Follow up therapy recommendations discussed and importance of having S at home due to increased fall risk and pt states she will have friends throughout the day to stay with her while her sister is at work. Stair negotiation training for home entry using L rail going up sideways up/down 12 steps with S. Neuro re-ed for balance training and BLE control including toe taps to target (x 10 reps each on 4" step, then with LLE only due to decreased coordination noted on L x 10 reps x 2 sets) and standing on compliant surface while performing reaching and squatting activities to simulate functional household tasks while maintaining back precautions. Pt required min verbal cues to problem solve technique in order to maintain precautions but able to independently identify when a situation would cause her to break precautions. Steady A overall needed. Returned back to room and transferred to recliner with S.    Normal TUG (with RW and TLSO) = 32 seconds  Five times Sit to Stand Test (FTSS) Method: Use a straight back chair with a solid seat that is 16-18" high. Ask participant to sit on the chair with arms folded across their chest.   Instructions: "Stand up and sit down as quickly as possible 5 times, keeping your arms folded across your chest."    Measurement: Stop timing when the participant stands the 5th time.  TIME: __37__ (in seconds) using hands to push up and lower down.  Times > 13.6 seconds is associated with increased disability and morbidity (Guralnik, 2000) Times > 15 seconds is predictive of recurrent falls in healthy individuals aged 31 and older (Buatois, et al., 2008) Normal performance values in community dwelling individuals aged 28 and older (Bohannon, 2006): o 60-69 years: 11.4 seconds o 70-79 years: 12.6 seconds o 80-89 years: 14.8 seconds  MCID: ? 2.3 seconds for Vestibular Disorders Mariah Milling, 2006)   Therapy Documentation Precautions:  Precautions Precautions: Fall, Back Precaution Booklet Issued: Yes (comment) Precaution Comments: Pt able to recall 3/3 back precautions Required Braces or Orthoses: Spinal Brace Spinal Brace: Thoracolumbosacral orthotic, Applied in supine position Restrictions Weight Bearing Restrictions: No  Pain: Reports mild back pain; premedicated.  See FIM for current functional status  Therapy/Group: Individual Therapy  Canary Brim Ivory Broad, PT, DPT  04/23/2014, 11:46 AM

## 2014-04-23 NOTE — Care Management Note (Signed)
[facility] Individual Statement of Services  Patient Name:  REESA GOTSCHALL  Date:  04/23/2014  Welcome to the [facility].  Our goal is to provide you with an individualized program based on your diagnosis and situation, designed to meet your specific needs.  With this comprehensive rehabilitation program, you will be expected to participate in at least 3 hours of rehabilitation therapies Monday-Friday, with modified therapy programming on the weekends.  Your rehabilitation program will include the following services:  Physical Therapy (PT), Occupational Therapy (OT), Speech Therapy (ST), 24 hour per day rehabilitation nursing, Therapeutic Recreaction (TR), Neuropsychology, Case Management (Social Worker), Rehabilitation Medicine, Nutrition Services and Pharmacy Services  Weekly team conferences will be held on Tuesdays to discuss your progress.  Your Social Worker will talk with you frequently to get your input and to update you on team discussions.  Team conferences with you and your family in attendance may also be held.  Expected length of stay: 7 days  Overall anticipated outcome: modified independent  Depending on your progress and recovery, your program may change. Your Social Worker will coordinate services and will keep you informed of any changes. Your Social Worker's name and contact numbers are listed  below.  The following services may also be recommended but are not provided by the [facility] will be made to provide these services after discharge if needed.  Arrangements include referral to agencies that provide these services.  Your insurance has been verified to be:  [employer] Your primary doctor is:  Dr. Jonni Sanger  Pertinent information will be shared with your doctor and your  insurance company.  Social Worker:  [place], [place] or ([phone]   Information discussed with and copy given to patient by: Lennart Pall, 04/23/2014, 2:41 PM

## 2014-04-23 NOTE — Progress Notes (Signed)
Chaplain responded to patient requesting Advanced Directive notarized. Chaplain and pt coordinated time after pt appointments completed for the day. Chaplain informed pt of the possibility of notarization at a later time. Pt sister also interested in an Advanced Directive form. Hartlee Amedee, Barbette Hair, Chaplain 04/23/2014 11:36 AM

## 2014-04-23 NOTE — Progress Notes (Signed)
Social Work Patient ID: [name], female   DOB: 1970/11/04, 44 y.o.   MRN: [id number]   Have reviewed team conference with pt who is aware and agreeable with targeted d/c date of 4/16 and mod i goals.  Plans to d/c home with her sister and nephew.  Denies any concerns at this time.  Zarif Rathje, LCSW

## 2014-04-23 NOTE — Progress Notes (Signed)
Occupational Therapy Session Note  Patient Details  Name: Carolyn [name] MRN: [id number] Date of Birth: 03/25/1970  Today's Date: 04/23/2014 OT Individual Time: 1330-1500 OT Individual Time Calculation (min): 90 min    Short Term Goals: Week 1:  OT Short Term Goal 1 (Week 1): STG=LTG due to short LOS  Skilled Therapeutic Interventions/Progress Updates:    Pt seen for OT session focusing on functional mobilty, functional activity tolerance, community mobility, IADL retraining and UE strength/ROM. Pt in recliner upon arrival, agreeable to tx. Pt ambulated throughout session with RW and supervision. In ADL kitchen, pt completed simple meal prep task of making an omlet with supervision and education provided throughout regarding home safety, energy conservation, and modifcations within the home. Pt demonstrated functional activty tolerance and balance, tolerating >20 minutes of standing at one time without need for rest break and accessing refridgerator and overhead cabinets. Pt demonstrated ability to carry items while managing RW safely. She demonstrated good safety awareness in the kitchen, with management of RW, and maintaining spinal precautions during functional task.    Following cooking task, pt ambulated throughout unit down to [place] of hospital to outside [place]. Pt voiced feeling "different" managing RW on various terrains, however, was able to maintain balance with navigating outside environment. Pt voiced desire to practice curb steps with RW. Verbal and demonstration cues provided prior to pt attempt. Pt completed x2 reps of up/down curb with min steadying assist. Pt returned to unit and to room, and sat in recliner. Education and demonstration provided regarding UE strength/ROM exercises using level I theraband. Exercises demonstrated, and pt return demonstrated exercises of elbow flexion/extension, internal/external rotation, and tricep extensions.    Pt requested return to bed at end of  session. She transferred to EOB with close supervision and no AE, and completed bed mobility mod I. Pt left in supine, all needs in reach, and CSW present.   Therapy Documentation Precautions:  Precautions Precautions: Fall, Back Precaution Booklet Issued: Yes (comment) Precaution Comments: Pt able to recall 3/3 back precautions Required Braces or Orthoses: Spinal Brace Spinal Brace: Thoracolumbosacral orthotic, Applied in supine position Restrictions Weight Bearing Restrictions: No Vital Signs: Therapy Vitals Temp: 98.8 F (37.1 C) Temp Source: Oral Pulse Rate: (!) 111 Resp: 20 BP: 107/62 mmHg Patient Position (if appropriate): Sitting Oxygen Therapy SpO2: 100 % O2 Device: Not Delivered Pain: Pain Assessment Pain Assessment: No/denies pain  See FIM for current functional status  Therapy/Group: Individual Therapy  [name], Kynlea Blackston C 04/23/2014, 2:52 PM

## 2014-04-23 NOTE — Patient Care Conference (Signed)
Inpatient RehabilitationTeam Conference and Plan of Care Update Date: 04/21/2014   Time: 2:25 PM    Patient Name: Carolyn [name]      Medical Record Number: [id number]  Date of Birth: Mar 06, 1970 Sex: Female         Room/Bed: 4W05C/4W05C-01 Payor Info: Payor: [employer] / Plan: [facility] PPO / Product Type: *No Product type* /    Admitting Diagnosis: spinal myelopathy  Admit Date/Time:  04/20/2014  2:58 PM Admission Comments: No comment available   Primary Diagnosis:  Myelopathy Principal Problem: Myelopathy  Patient Active Problem List   Diagnosis Date Noted  . 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 Malnutrition of moderate degree 04/13/2014  . Myelopathy 04/12/2014  . Pathologic fracture of thoracic vertebrae 04/12/2014  . Biliary colic 37/16/9678  . Breast cancer of upper-outer quadrant of right female breast 09/24/2013  . S/P total hysterectomy and bilateral salpingo-oophorectomy 12/23/2012  . History of chemotherapy   . Hx of radiation therapy     Expected Discharge Date: Expected Discharge Date: 04/25/14  Team Members Present: Physician leading conference: Dr. Alger Simons Social Worker Present: Lennart Pall, LCSW Nurse Present: Elliot Cousin, RN PT Present: Canary Brim, Lorriane Shire, PT OT Present: Salome Spotted, OT;Other (comment) Napoleon Form, OT) SLP Present: Weston Anna, SLP Other (Discipline and Name): Danne Baxter, RN [facility]) PPS Coordinator present : Daiva Nakayama, RN, [facility]     Current Status/Progress Goal Weekly Team Focus  Medical   breast cancer with mets to spine/cord compression. s/p decompression  improve functional mobility  post op management   Bowel/Bladder   Continent of bowel and bladder; LBM 4/11  Mod I  Maintain current regimen; assess and treat for constipation as needed   Swallow/Nutrition/ Hydration             ADL's   min A functional transfers; set-up UB dressing; steadying assist LB dressing  Overall  Supervision  Functonal transfers; functional activity tolerance; IADLs; balance   Mobility   min to mod A overall  S overall; min A stairs  strengthening, endurance, transfers, education, stairs, neuro re-ed, balance   Communication             Safety/Cognition/ Behavioral Observations            Pain   C/o upper back pain; scheduled oxycontin and prn oxycodone in use- seems effective at this time  < 4  Assess and treat for pain q shift and prn   Skin   Incision to upper back clean dry and intact with dermabond  Mod I  Assess skin q shift and prn    Rehab Goals Patient on target to meet rehab goals: Yes *See Care Plan and progress notes for long and short-term goals.  Barriers to Discharge: breast cancer progression, brace donning and doffing    Possible Resolutions to Barriers:  family and patient education,     Discharge Planning/Teaching Needs:  home with sister and nephew where close to 24/7 supervision is available      Team Discussion:  Expect short LOS;  Pt says can cover 24/7 if needed at home, however, goals mostly set for mod i goals (except for donning brace).  Motivated and doing well.  No concerns.  Revisions to Treatment Plan:  None   Continued Need for Acute Rehabilitation Level of Care: The patient requires daily medical management by a physician with specialized training in physical medicine and rehabilitation for the following conditions: Daily direction of  a multidisciplinary physical rehabilitation program to ensure safe treatment while eliciting the highest outcome that is of practical value to the patient.: Yes Daily medical management of patient stability for increased activity during participation in an intensive rehabilitation regime.: Yes Daily analysis of laboratory values and/or radiology reports with any subsequent need for medication adjustment of medical intervention for : Post surgical problems;Neurological problems  Georgios Kina 04/23/2014, 2:37  PM

## 2014-04-23 NOTE — Progress Notes (Signed)
Occupational Therapy Session Note  Patient Details  Name: Carolyn [name] MRN: [id number] Date of Birth: [birth year]  Today's Date: 04/23/2014 OT Individual Time: 2641-5830 OT Individual Time Calculation (min): 60 min    Short Term Goals: Week 1:  OT Short Term Goal 1 (Week 1): STG=LTG due to short LOS  Skilled Therapeutic Interventions/Progress Updates:    Pt seen for OT ADL bathing and dressing session. Pt in supine upon arrival, agreeable to tx. Pt transferred supine> EOB with VCs. TLSO brace donned with min A from therapist. Pt ambulated throughout room and complete tub shower transfer with RW and close supervision. Pt completed bathing tsk seated in shower with supervision, using lateral leans to complete pericare/buttock hygiene. Following shower, pt dressed LB seated in w/c with stab by assist to stand to pull pants up standing at RW. Pt returned to EOB with supervision to dress UB and pads on TLSO brace change.d. Pt educated regarding cleaning and care of TLSO brace and pads. Seated EOB, pt ate breakfast with supervision. Education provided throughout session regarding energy conservation, importance of planning ahead for good and bad days upon d/c and d/c planning.    Following breakfast, pt ambulated throughout unit with RW and supervision. She ambulated ~131ft to ADL apartment, where she accessed cabinets and refrigerator in prep for cooking task at next session. Pt demonstrated functional balance and endurance, able to maintain spinal precautions during functional task and without LOB. Pt then ambulated back to room and returned to recliner with close supervision. Pt left in recliner with all needs in reach  Therapy Documentation Precautions:  Precautions Precautions: Fall, Back Precaution Booklet Issued: Yes (comment) Precaution Comments: Pt able to recall 3/3 back precautions Required Braces or Orthoses: Spinal Brace Spinal Brace: Thoracolumbosacral orthotic, Applied in supine  position Restrictions Weight Bearing Restrictions: No Pain: Pain Assessment Pain Assessment: No/denies pain Pain Score: 3  Pain Type: Acute pain Pain Location: Back Pain Orientation: Upper Pain Descriptors / Indicators: Aching Pain Intervention(s): Medication (See eMAR)  See FIM for current functional status  Therapy/Group: Individual Therapy  [name], Delford Wingert C 04/23/2014, 8:05 AM

## 2014-04-24 ENCOUNTER — Inpatient Hospital Stay (HOSPITAL_COMMUNITY): Payer: BC Managed Care – PPO | Admitting: Occupational Therapy

## 2014-04-24 ENCOUNTER — Other Ambulatory Visit: Payer: Self-pay | Admitting: Radiation Oncology

## 2014-04-24 ENCOUNTER — Ambulatory Visit: Payer: BC Managed Care – PPO

## 2014-04-24 ENCOUNTER — Inpatient Hospital Stay (HOSPITAL_COMMUNITY): Payer: BC Managed Care – PPO | Admitting: Physical Therapy

## 2014-04-24 MED ORDER — OXYCODONE HCL ER 10 MG PO T12A: 10.0000 mg | EXTENDED_RELEASE_TABLET | Freq: Every day | ORAL | Status: DC

## 2014-04-24 MED ORDER — METHOCARBAMOL 500 MG PO TABS: 500.0000 mg | ORAL_TABLET | Freq: Four times a day (QID) | ORAL | Status: DC | PRN

## 2014-04-24 MED ORDER — SENNOSIDES-DOCUSATE SODIUM 8.6-50 MG PO TABS: 2.0000 | ORAL_TABLET | Freq: Every day | ORAL | Status: DC

## 2014-04-24 MED ORDER — OXYCODONE HCL 5 MG PO TABS: 5.0000 mg | ORAL_TABLET | Freq: Four times a day (QID) | ORAL | Status: DC | PRN

## 2014-04-24 MED ORDER — ALPRAZOLAM 0.25 MG PO TABS: 0.2500 mg | ORAL_TABLET | Freq: Two times a day (BID) | ORAL | Status: DC | PRN

## 2014-04-24 MED ORDER — POLYSACCHARIDE IRON COMPLEX 150 MG PO CAPS: 150.0000 mg | ORAL_CAPSULE | Freq: Every day | ORAL | Status: DC

## 2014-04-24 MED ORDER — DEXAMETHASONE 4 MG PO TABS: 4.0000 mg | ORAL_TABLET | Freq: Four times a day (QID) | ORAL | Status: DC

## 2014-04-24 NOTE — Progress Notes (Signed)
Occupational Therapy Session Note  Patient Details  Name: Carolyn [name] MRN: [id number] Date of Birth: [birth year]  Today's Date: 04/24/2014 OT Individual Time: 0830-1000 OT Individual Time Calculation (min): 90 min    Short Term Goals: Week 1:  OT Short Term Goal 1 (Week 1): STG=LTG due to short LOS  Skilled Therapeutic Interventions/Progress Updates:    Pt seen for OT session focusing on ADL retraining, function mobility, functional standing balance, and activity tolerance. Pt sitting in recliner uon arrival, agreeable to tx. Pt completed functional transfers and ambulation with RW and supervision. She gathered clothing and bathing items from around room and ambulated pushing w/c to ADL apartment to complete bathing task in tub shower combination She transferred on tub transfer bench with supervision and cues for technique. Pt bathed with supervision, using lateral leans to complete buttock hygiene. Pt required set-up assist to don/doff TLSO prior to and after showering task. Education provided regarding the proper positioning of pads when changing out pads. Following shower, pt pushed w/c back to room and sat EOB to rest. Pt ambulated the remainder of session with RW. Pt ambulated to therapy gym where she completed various LE strengthening/ stability exercises to increase dynamic standing balance in prep for ADL/IADL standing tasks. Pt stood with CGA to toss ball on tilted trampoline, she did so succesfuly standing with feet shoulder width apart, and staggered stepped on B sides without LOB. Following seated rest break, pt completed same task standing on foam mat to challenge balance. Pt required min A and cues to maintain balance while tossing ball. She then completed task with L LE on foam mat and R LE on solid floor with min A and pt stating that was a more difficult position. She then completed x10 reps of stepping on/off 4 inch block in simulation of community curb. She required min A  With  occasional mod to regain balance and VCs for sequencing steps. Seated on mat, pt completed soccer ball rolls back and forth and kicking ball to therapist from seated position to increase functional strength and stability in B LEs. At end of session, pt ambulated back to room with RW and supervision. Pt left in recliner with all needs in reach. D/C planning discussed and pt voiced feeling confident going home with family.   Therapy Documentation Precautions:  Precautions Precautions: Fall, Back Precaution Booklet Issued: Yes (comment) Precaution Comments: Pt able to recall 3/3 back precautions Required Braces or Orthoses: Spinal Brace Spinal Brace: Thoracolumbosacral orthotic, Other (comment) Spinal Brace Comments: when OOB Restrictions Weight Bearing Restrictions: No Pain: Pain Assessment Pain Assessment: 0-10 Pain Score: 3  Pain Location: Back Pain Orientation: Right;Mid Pain Descriptors / Indicators: Aching Pain Intervention(s): Shower;Repositioned;Ambulation/increased activity  See FIM for current functional status  Therapy/Group: Individual Therapy  [name], Shalin Linders C 04/24/2014, 9:35 AM

## 2014-04-24 NOTE — Progress Notes (Signed)
Social Work  Discharge Note  The overall goal for the admission was met for:   Discharge location: Yes - home with sister and nephew  Length of Stay: Yes - 5 days (with 4/16 discharge)  Discharge activity level: Yes - modified independent  Home/community participation: Yes  Services provided included: MD, RD, PT, OT, RN, TR, Pharmacy, [facility]: Private Insurance: [place] BCBS  Follow-up services arranged: Home Health: PT via [facility], DME: 16x18 lightweight w/c, cushion, rolling walker, 3n1 commode and tub bench via [facility] and Patient/Family has no preference for HH/DME agencies  Comments (or additional information):  Patient/Family verbalized understanding of follow-up arrangements: Yes  Individual responsible for coordination of the follow-up plan: patient  Confirmed correct DME delivered: Nirvan Laban 04/24/2014    Elwyn Lowden

## 2014-04-24 NOTE — Progress Notes (Signed)
Occupational Therapy Session Note  Patient Details  Name: Carolyn [name] MRN: [id number] Date of Birth: August 04, 1970  Today's Date: 04/24/2014 OT Individual Time: 1330-1430 OT Individual Time Calculation (min): 60 min    Short Term Goals: Week 1:  OT Short Term Goal 1 (Week 1): STG=LTG due to short LOS  Skilled Therapeutic Interventions/Progress Updates:    Pt seen for OT session focusing on functional activity tolerance, community mobility, and functional standing balance. Pt in recliner upon arrival, agreeable to tx. Pt ambulated throughout session with RW and supervision. Pt ambulated throughout hospital and unit to hospital gift store navigating environmental barriers and various terrains with supervision and occasional VCs for awareness of upcoming hazards. Pt navigated through gift shop managing RW and adhering to spinal precautions in functional and community environment. Pt then walked outside and voiced desire to practice steps in outside terrace area. Pt able to recall safety recommendations made by PT for stair negotiation, and ascended and descended 6 steps x4 trials with min A and VCs for technique. Pt then ambulated back to therapy gym where she took seated rest break. Pt tolerated >30 minutes of community ambulation without need for rest break and without showing signs/symptoms of fatigue. In therapy gym, pt completed standing exercsies on foam mat, tossing horse shoes.  Pt required CGA-min A to stand on foam and complete tossing game. At end of session, pt ambulated back to room with min A and no AE. Mod A required to regain balance following 1 LOB episode. Pt returned to recliner, all needs in reach. All questions regarding d/c planning answered.    Education provided regarding need for assist at d/c, benefits of mobility/ OOB, energy conservation, and d/c planning.   Therapy Documentation Precautions:  Precautions Precautions: Fall, Back Precaution Booklet Issued: Yes  (comment) Precaution Comments: Pt able to recall 3/3 back precautions Required Braces or Orthoses: Spinal Brace Spinal Brace: Thoracolumbosacral orthotic, Other (comment) Spinal Brace Comments: when OOB Restrictions Weight Bearing Restrictions: No Pain: Pain Assessment Pain Assessment: No/denies pain  See FIM for current functional status  Therapy/Group: Individual Therapy  [name], Reyann Troop C 04/24/2014, 3:18 PM

## 2014-04-24 NOTE — Discharge Instructions (Signed)
Inpatient Rehab Discharge Instructions  Carolyn [name] Discharge date and time:    Activities/Precautions/ Functional Status: Activity: no lifting, driving, or strenuous exercise till cleared by MD. Wear brace when out of bed.  Diet: regular diet Wound Care: keep wound clean and dry   Functional status:  ___ No restrictions     ___ Walk up steps independently ___ 24/7 supervision/assistance   ___ Walk up steps with assistance ___ Intermittent supervision/assistance  ___ Bathe/dress independently _X__ Walk with walker     ___ Bathe/dress with assistance ___ Walk Independently    ___ Shower independently ___ Walk with assistance    ___ Shower with assistance _X__ No alcohol     ___ Return to work/school ________    COMMUNITY REFERRALS UPON DISCHARGE:    Home Health:   PT                     Agency: [facility] Phone: [phone]   Medical Equipment/Items Ordered:  Wheelchair, cushion, rolling walker, commode and tub bench                                                      Agency/Supplier: [facility] @ [phone]   GENERAL COMMUNITY RESOURCES FOR PATIENT/FAMILY:  Support Groups: available via [facility] (handout)      Special Instructions:    My questions have been answered and I understand these instructions. I will adhere to these goals and the provided educational materials after my discharge from the hospital.  Patient/Caregiver Signature _______________________________ Date __________  Clinician Signature _______________________________________ Date __________  Please bring this form and your medication list with you to all your follow-up doctor's appointments.

## 2014-04-24 NOTE — Progress Notes (Signed)
Physical Therapy Discharge Summary  Patient Details  Name: Carolyn [name] MRN: [id number] Date of Birth: 08-02-1970  Today's Date: 04/24/2014 PT Individual Time: 1500-1600 PT Individual Time Calculation (min): 60 min    Patient has met 9 of 9 long term goals due to improved activity tolerance, improved balance, improved postural control, increased strength and improved coordination.  Patient to discharge at a household ambulatory level level Modified Independent.   Patient's care partner is independent to provide the necessary physical assistance at discharge.  Recommendation:  Patient will benefit from ongoing skilled PT services in home health setting to continue to advance safe functional mobility, address ongoing impairments in endurance, safety, ambulation, balance, and minimize fall risk.  Equipment: RW and 16x18 manual w/c with basic cushion  Reasons for discharge: treatment goals met and discharge from hospital  Patient/family agrees with progress made and goals achieved: Yes  Therapeutic Intervention Therapeutic Activity: PT instructs pt in car transfer req SBA with verbal cues for hand placement for ease of access and supervision/set up assist to manage RW once in car.  Pt demonstrates mod I recliner to/from bed transfer with RW and mod I bed mobility, adhereing to spinal precautions.   Gait Training: Pt demonstrates mod I ambulation with RW in home setting and in a controlled environment x 150' total.  Pt demonstrates stair negotiation x 12 steps req CGA for safety with B rails - pt completes 6" stairs in step-to pattern and 4" stairs in step-over-step pattern. PT instructs pt to have sister nearbye when ascending stairs, but to have sister hold on when descending stairs. Pt completes 3 more sets of stairs with the above level of assist for therapeutic strengthening.   W/C Management: Pt demonstrates mod I w/c propulsion with B UEs x 200'. Pt is able to manage brakes and doff  legrests, but req supervision to don legrests.   Pt is safe to d/c home.     PT Discharge Precautions/Restrictions Precautions Precautions: Fall;Back Precaution Comments: Pt able to recall 3/3 back precautions Required Braces or Orthoses: Spinal Brace Spinal Brace: Thoracolumbosacral orthotic;Other (comment) Spinal Brace Comments: able to be donned in sit per pt report Restrictions Weight Bearing Restrictions: No Other Position/Activity Restrictions: No order in chart for brace at this time but MD notes state she needs for mobility. Vital Signs Therapy Vitals Temp: 97.6 F (36.4 C) Temp Source: Oral Pulse Rate: 88 BP: (!) 95/58 mmHg Patient Position (if appropriate): Sitting Oxygen Therapy SpO2: 100 % O2 Device: Not Delivered Pain Pain Assessment Pain Assessment: No/denies pain Pain Score: 3  Pain Type: Acute pain Pain Location: Back Pain Orientation: Right;Upper Pain Descriptors / Indicators: Throbbing Pain Intervention(s): Rest;RN made aware Multiple Pain Sites: No Vision/Perception  Perception Comments: appears wfl Praxis Praxis-Other Comments: appears wfl  Cognition Overall Cognitive Status: Within Functional Limits for tasks assessed Arousal/Alertness: Awake/alert Orientation Level: Oriented X4 Attention: Focused;Sustained Focused Attention: Appears intact Sustained Attention: Appears intact Memory: Appears intact Awareness: Appears intact Problem Solving: Appears intact Safety/Judgment: Appears intact Comments: Pt able to demonstrate adherence to spinal precautions during functional tasks and mobility. Sensation Sensation Light Touch: Impaired Detail Light Touch Impaired Details: Impaired RLE;Impaired LLE Stereognosis: Not tested Hot/Cold: Not tested Proprioception: Appears Intact Additional Comments: toes tingly, but pt reports they are coming back Coordination Gross Motor Movements are Fluid and Coordinated: Yes Fine Motor Movements are Fluid  and Coordinated: Not tested Finger Nose Finger Test: slight intention tremor with L side Heel Shin Test: wfl bilaterally Motor  Motor  Motor: Abnormal postural alignment and control;Ataxia Motor - Discharge Observations: TLSO for any upright or OOB mobiilty  Mobility Bed Mobility Bed Mobility: Sit to Supine;Supine to Sit Supine to Sit: 6: Modified independent (Device/Increase time) Sit to Supine: 6: Modified independent (Device/Increase time) Transfers Transfers: Yes Sit to Stand: 6: Modified independent (Device/Increase time) Stand to Sit: 6: Modified independent (Device/Increase time) Stand Pivot Transfers: 6: Modified independent (Device/Increase time) Locomotion  Ambulation Ambulation: Yes Ambulation/Gait Assistance: 6: Modified independent (Device/Increase time) Ambulation Distance (Feet): 150 Feet Assistive device: Rolling walker Gait Gait: Yes Gait Pattern: Impaired Gait Pattern: Lateral hip instability;Ataxic Gait velocity: decreased Stairs / Additional Locomotion Stairs: Yes Stairs Assistance: 4: Min guard Stair Management Technique: Two rails Number of Stairs: 12 Height of Stairs: 6 Wheelchair Mobility Wheelchair Mobility: Yes Wheelchair Assistance: 6: Modified independent (Device/Increase time) Environmental health practitioner: Both upper extremities Wheelchair Parts Management: Supervision/cueing Distance: 200  Trunk/Postural Assessment  Cervical Assessment Cervical Assessment: Within Water engineer Thoracic Assessment: Exceptions to [facility] Thoracic AROM Overall Thoracic AROM: Deficits;Due to precautions Overall Thoracic AROM Comments: tlso on when oob Lumbar Assessment Lumbar Assessment: Exceptions to [facility] Lumbar AROM Overall Lumbar AROM: Deficits;Due to precautions Overall Lumbar AROM Comments: tlso on when oob Postural Control Postural Control: Deficits on evaluation Postural Limitations: TLSO for any upright or OOB mobility; back  precautions  Balance Balance Balance Assessed: Yes Static Sitting Balance Static Sitting - Level of Assistance: 6: Modified independent (Device/Increase time) Dynamic Sitting Balance Dynamic Sitting - Level of Assistance: 6: Modified independent (Device/Increase time) Static Standing Balance Static Standing - Level of Assistance: 6: Modified independent (Device/Increase time) Dynamic Standing Balance Dynamic Standing - Level of Assistance: 6: Modified independent (Device/Increase time) Extremity Assessment  RUE Assessment RUE Assessment: Within Functional Limits LUE Assessment LUE Assessment: Within Functional Limits RLE Assessment RLE Assessment: Within Functional Limits LLE Assessment LLE Assessment: Within Functional Limits  See FIM for current functional status  Micahel Omlor M 04/24/2014, 3:34 PM

## 2014-04-24 NOTE — Progress Notes (Signed)
Occupational Therapy Discharge Summary  Patient Details  Name: ELDORA NAPP MRN: [id number] Date of Birth: March 25, 1970   Patient has met 8 of 8 long term goals due to improved activity tolerance, improved balance, postural control and improved coordination.  Patient to discharge at overall Modified Independent level.  Patient's care partner is independent to provide the necessary physical assistance at discharge.     Recommendation:  Pt with no further OT needs at this time.   Equipment: Tub transfer bench and BSC  Reasons for discharge: treatment goals met and discharge from hospital  Patient/family agrees with progress made and goals achieved: Yes  OT Discharge Precautions/Restrictions  Precautions Precautions: Fall;Back Required Braces or Orthoses: Spinal Brace Spinal Brace: Thoracolumbosacral orthotic;Other (comment) Spinal Brace Comments: when OOB Restrictions Weight Bearing Restrictions: No Pain Pain Assessment Pain Assessment: 0-10 Pain Score: 3  Pain Intervention(s): Medication (See eMAR) Vision/Perception  Vision- History Baseline Vision/History: Wears glasses Wears Glasses: Reading only Patient Visual Report: No change from baseline Vision- Assessment Vision Assessment?: No apparent visual deficits  Cognition Overall Cognitive Status: Within Functional Limits for tasks assessed Arousal/Alertness: Awake/alert Orientation Level: Oriented X4 Memory: Appears intact Awareness: Appears intact Problem Solving: Appears intact Safety/Judgment: Appears intact Comments: Pt able to demonstrate adherence to spinal precautions during functional tasks and mobility. Sensation Coordination Gross Motor Movements are Fluid and Coordinated: Yes Fine Motor Movements are Fluid and Coordinated: Yes Motor  Motor Motor: Ataxia;Abnormal postural alignment and control;Other (comment) Motor - Discharge Observations: TLSO for any upright or OOB mobiilty Mobility  Bed  Mobility Bed Mobility: Rolling Right;Rolling Left;Supine to Sit;Sit to Supine Rolling Right: 6: Modified independent (Device/Increase time) Rolling Left: 6: Modified independent (Device/Increase time) Supine to Sit: 6: Modified independent (Device/Increase time) Sit to Supine: 6: Modified independent (Device/Increase time) Transfers Transfers: Sit to Stand;Stand to Sit Sit to Stand: 6: Modified independent (Device/Increase time) Stand to Sit: 6: Modified independent (Device/Increase time)  Trunk/Postural Assessment  Cervical Assessment Cervical Assessment: Within Functional Limits Thoracic Assessment Thoracic Assessment: Exceptions to WFL (TLSO donned when OOB; back precautions) Lumbar Assessment Lumbar Assessment: Exceptions to WFL (TLSO donned when OOB; back precautions) Postural Control Postural Control: Deficits on evaluation Postural Limitations: TLSO for any upright or OOB mobility; back precautions  Balance Balance Balance Assessed: Yes Static Sitting Balance Static Sitting - Level of Assistance: 6: Modified independent (Device/Increase time) Dynamic Sitting Balance Dynamic Sitting - Level of Assistance: 6: Modified independent (Device/Increase time) Static Standing Balance Static Standing - Level of Assistance: 6: Modified independent (Device/Increase time) Dynamic Standing Balance Dynamic Standing - Level of Assistance: 6: Modified independent (Device/Increase time) Extremity/Trunk Assessment RUE Assessment RUE Assessment: Within Functional Limits LUE Assessment LUE Assessment: Within Functional Limits  See FIM for current functional status  [name], Christana Angelica C 04/24/2014, 7:10 AM

## 2014-04-24 NOTE — Progress Notes (Signed)
[place] PHYSICAL MEDICINE & REHABILITATION     PROGRESS NOTE    Subjective/Complaints: No new issues. Had a better day, as day wasn't as long. Pt denies nausea, vomiting, abdominal pain, diarrhea, chest pain, shortness of breath, palpitations, dizziness   Objective: Vital Signs: Blood pressure 92/55, pulse 92, temperature 98.2 F (36.8 C), temperature source Oral, resp. rate 18, height 5\' 8"  (1.727 m), weight 60.6 kg (133 lb 9.6 oz), SpO2 99 %. No results found.  Recent Labs  04/21/14 0910  WBC 5.5  HGB 10.8*  HCT 33.5*  PLT 264    Recent Labs  04/21/14 0910  NA 141  K 3.6  CL 103  GLUCOSE 133*  BUN 9  CREATININE 0.77  CALCIUM 9.5   CBG (last 3)   Recent Labs  04/22/14 0842  GLUCAP 108*    Wt Readings from Last 3 Encounters:  04/20/14 60.6 kg (133 lb 9.6 oz)  04/13/14 58.5 kg (128 lb 15.5 oz)  02/28/14 60 kg (132 lb 4.4 oz)    Physical Exam:  Constitutional: She is oriented to person, place, and time. She appears well-developed and well-nourished.  HENT:  Head: Normocephalic and atraumatic.  Eyes: Conjunctivae are normal. Pupils are equal, round, and reactive to light.  Neck: Normal range of motion. Neck supple.  Cardiovascular: Regular rhythm. Tachycardia present.  Respiratory: Effort normal and breath sounds normal. No respiratory distress. She has no wheezes.  GI: Soft. Bowel sounds are normal. She exhibits no distension. There is no tenderness.  Musculoskeletal: She exhibits no edema.  Neurological: She is alert and oriented to person, place, and time. No cranial nerve deficit.  Speech clear. Follows commands without difficulty. RUE weakness proximally with decreased ROM at shoulder. BLE with proximal weakness and instability. Mild sensory deficits LLE: 1+/2 to LT,PP, proprioception. Strength 4/5 ue's prox to distal with some limitations due to pain. LE: 4- hf, 4 ke and 4+ both ankles. BLE with 3+ DTR.  Skin: Skin is warm and dry.   back incision clean and dry.  Psychiatric: She has a normal mood and affect. Her behavior is normal. Judgment and thought content normal.    Assessment/Plan: 1. 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 Physiatrist is providing close team supervision and 24 hour management of active medical problems listed below. Physiatrist and rehab team continue to assess barriers to discharge/monitor patient progress toward functional and medical goals. FIM: FIM - Bathing Bathing Steps Patient Completed: Right lower leg (including foot), Left lower leg (including foot), Chest, Right Arm, Left Arm, Abdomen, Front perineal area, Buttocks, Right upper leg, Left upper leg Bathing: 5: Supervision: Safety issues/verbal cues  FIM - Upper Body Dressing/Undressing Upper body dressing/undressing steps patient completed: Thread/unthread right sleeve of pullover shirt/dresss, Thread/unthread left sleeve of pullover shirt/dress, Put head through opening of pull over shirt/dress, Pull shirt over trunk Upper body dressing/undressing: 5: Set-up assist to: Apply TLSO, cervical collar FIM - Lower Body Dressing/Undressing Lower body dressing/undressing steps patient completed: Thread/unthread left pants leg, Thread/unthread right pants leg, Pull pants up/down Lower body dressing/undressing: 4: Steadying Assist  FIM - Toileting Toileting steps completed by patient: Adjust clothing prior to toileting, Performs perineal hygiene, Adjust clothing after toileting Toileting Assistive Devices: Grab bar or rail for support Toileting: 5: Supervision: Safety issues/verbal cues  FIM - Radio producer Devices: Grab bars Toilet Transfers: 4-To toilet/BSC: Min A (  steadying Pt. > 75%), 5-From toilet/BSC: Supervision (verbal cues/safety issues)  FIM - Financial trader Devices: Orthosis, Adult nurse Transfer: 5: Bed > Chair or W/C: Supervision (verbal cues/safety issues), 5: Chair or W/C > Bed: Supervision (verbal cues/safety issues)  FIM - Locomotion: Wheelchair Locomotion: Wheelchair: 0: Activity did not occur FIM - Locomotion: Ambulation Locomotion: Ambulation Assistive Devices: Administrator, Orthosis Ambulation/Gait Assistance: 5: Supervision, 4: Min guard Locomotion: Ambulation: 4: Travels 150 ft or more with minimal assistance (Pt.>75%)  Comprehension Comprehension Mode: Auditory Comprehension: 7-Follows complex conversation/direction: With no assist  Expression Expression Mode: Verbal Expression: 7-Expresses complex ideas: With no assist  Social Interaction Social Interaction: 6-Interacts appropriately with others with medication or extra time (anti-anxiety, antidepressant).  Problem Solving Problem Solving: 7-Solves complex problems: Recognizes & self-corrects  Memory Memory: 7-Complete Independence: No helper Medical Problem List and Plan: 1. 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 Onc/rad-onc following  -decadron ordered  -pt simulated ---radiation to follow (next week?) 2. DVT Prophylaxis/Anticoagulation: Mechanical: Sequential compression devices, below knee Bilateral lower extremities 3. Pain Management:   OxyContin at bedtime to help with more consistent pain management. Continue oxy IR prn breakthrough pain.   4. Mood: She is reporting some anxiety about current situation.  low dose xanax prn. Team to provide ego support. Has good support system--family and her church. LCSW to follow for evaluation and support. consider neuropsych eval 5. Neuropsych: This patient is capable of making decisions on his own behalf. 6. Skin/Wound Care: Routine pressure relief measures.  7.  Fluids/Electrolytes/Nutrition: Monitor I/O. Check follow up labs as available. Offer snacks bid to augment intake.  8. ABLA: Improved after transfusion.  Added iron supplement.  9. Constipation: Continue Senna S 10. Hypokalemia:  improved  LOS (Days) 4 A FACE TO FACE EVALUATION WAS PERFORMED  Lenya Sterne T 04/24/2014 8:13 AM

## 2014-04-25 NOTE — Progress Notes (Signed)
04/25/14 1300 nsg Pt discharged to home pewr wheelchair accompanied by NT and family members

## 2014-04-25 NOTE — Progress Notes (Signed)
[place] PHYSICAL MEDICINE & REHABILITATION     PROGRESS NOTE    Subjective/Complaints: No new complaints. Needed FMLA paperwork fixed. no shortness of breath, palpitations, dizziness   Objective: Vital Signs: Blood pressure 91/57, pulse 80, temperature 97.8 F (36.6 C), temperature source Oral, resp. rate 18, height 5\' 8"  (1.727 m), weight 60.6 kg (133 lb 9.6 oz), SpO2 99 %. No results found. No results for input(s): WBC, HGB, HCT, PLT in the last 72 hours. No results for input(s): NA, K, CL, GLUCOSE, BUN, CREATININE, CALCIUM in the last 72 hours.  Invalid input(s): CO CBG (last 3)   Recent Labs  04/22/14 0842  GLUCAP 108*    Wt Readings from Last 3 Encounters:  04/20/14 60.6 kg (133 lb 9.6 oz)  04/13/14 58.5 kg (128 lb 15.5 oz)  02/28/14 60 kg (132 lb 4.4 oz)    Physical Exam:  Constitutional: She is oriented to person, place, and time. She appears well-developed and well-nourished.  HENT:  Head: Normocephalic and atraumatic.  Eyes: Conjunctivae are normal. Pupils are equal, round, and reactive to light.  Neck: Normal range of motion. Neck supple.  Cardiovascular: Regular rhythm.    Respiratory: Effort normal and breath sounds normal. No respiratory distress. She has no wheezes.  GI: Soft. Bowel sounds are normal. She exhibits no distension. There is no tenderness.  Musculoskeletal: She exhibits no edema.  Neurological: She is alert and oriented to person, place, and time. No cranial nerve deficit.  Speech clear. Follows commands without difficulty. RUE weakness proximally with decreased ROM at shoulder. BLE with proximal weakness and instability. Mild sensory deficits LLE: 1+/2 to LT,PP, proprioception. Strength 4/5 ue's prox to distal with some limitations due to pain. LE: 4- hf, 4 ke and 4+ both ankles. BLE with 3+ DTR.  Skin: Skin is warm and dry.  back incision clean and dry.  Psychiatric: She has a normal mood and affect. Her behavior is normal.  Judgment and thought content normal.    Assessment/Plan: 1. 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 Physiatrist is providing close team supervision and 24 hour management of active medical problems listed below. Physiatrist and rehab team continue to assess barriers to discharge/monitor patient progress toward functional and medical goals.  Dc home today.  FIM: FIM - Bathing Bathing Steps Patient Completed: Right lower leg (including foot), Left lower leg (including foot), Chest, Right Arm, Left Arm, Abdomen, Front perineal area, Buttocks, Right upper leg, Left upper leg Bathing: 6: More than reasonable amount of time  FIM - Upper Body Dressing/Undressing Upper body dressing/undressing steps patient completed: Thread/unthread right sleeve of pullover shirt/dresss, Thread/unthread left sleeve of pullover shirt/dress, Put head through opening of pull over shirt/dress, Pull shirt over trunk Upper body dressing/undressing: 6: More than reasonable amount of time FIM - Lower Body Dressing/Undressing Lower body dressing/undressing steps patient completed: Thread/unthread left pants leg, Thread/unthread right pants leg, Pull pants up/down, Don/Doff left sock, Don/Doff right sock, Don/Doff right shoe, Don/Doff left shoe Lower body dressing/undressing: 6: More than reasonable amount of time  FIM - Toileting Toileting steps completed by patient: Adjust clothing prior to toileting, Performs perineal hygiene, Adjust clothing after toileting Toileting Assistive Devices: Grab bar or rail for support Toileting: 6: Assistive device: No helper  FIM - Radio producer Devices: Insurance account manager Transfers: 6-Assistive device: No helper  FIM - Control and instrumentation engineer  Devices: Walker, Orthosis, Arm rests Bed/Chair  Transfer: 6: Supine > Sit: No assist, 6: Assistive device: no helper, 6: Sit > Supine: No assist, 6: Bed > Chair or W/C: No assist, 6: Chair or W/C > Bed: No assist  FIM - Locomotion: Wheelchair Distance: 200 Locomotion: Wheelchair: 6: Travels 150 ft or more, turns around, maneuvers to table, bed or toilet, negotiates 3% grade: maneuvers on rugs and over door sills independently FIM - Locomotion: Ambulation Locomotion: Ambulation Assistive Devices: Administrator, Orthosis Ambulation/Gait Assistance: 6: Modified independent (Device/Increase time) Locomotion: Ambulation: 6: Travels 150 ft or more with assistive device/no helper  Comprehension Comprehension Mode: Auditory Comprehension: 7-Follows complex conversation/direction: With no assist  Expression Expression Mode: Verbal Expression: 7-Expresses complex ideas: With no assist  Social Interaction Social Interaction: 6-Interacts appropriately with others with medication or extra time (anti-anxiety, antidepressant).  Problem Solving Problem Solving: 7-Solves complex problems: Recognizes & self-corrects  Memory Memory: 7-Complete Independence: No helper Medical Problem List and Plan: 1. 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 Onc/rad-onc following  -decadron ordered  -pt simulated ---radiation as outpt 2. DVT Prophylaxis/Anticoagulation: Mechanical: Sequential compression devices, below knee Bilateral lower extremities 3. Pain Management:   OxyContin at bedtime to help with more consistent pain management. Continue oxy IR prn breakthrough pain.   4. Mood: She is reporting some anxiety about current situation.  low dose xanax prn. Team to provide ego support. Has good support system--family and her church. LCSW to follow for evaluation and support. consider neuropsych eval 5. Neuropsych: This patient is capable of making  decisions on his own behalf. 6. Skin/Wound Care: Routine pressure relief measures.  7. Fluids/Electrolytes/Nutrition: Monitor I/O. Check follow up labs as available. Offer snacks bid to augment intake.  8. ABLA: Improved after transfusion.  Added iron supplement.  9. Constipation: Continue Senna S 10. Hypokalemia:  improved  LOS (Days) 5 A FACE TO FACE EVALUATION WAS PERFORMED  SWARTZ,ZACHARY T 04/25/2014 8:28 AM

## 2014-04-25 NOTE — Progress Notes (Signed)
04/25/14 1152 nsg Patient has discharge orders today. Family in room claims they will be going by 1pm. Pt claims she got all discharge papers and no further questions. Due meds given.

## 2014-04-27 ENCOUNTER — Ambulatory Visit
Admit: 2014-04-27 | Discharge: 2014-04-27 | Disposition: A | Discharge status: Home / Self Care | Payer: BC Managed Care – PPO | Attending: Radiation Oncology | Admitting: Radiation Oncology

## 2014-04-27 ENCOUNTER — Ambulatory Visit: Payer: BC Managed Care – PPO | Admitting: Radiation Oncology

## 2014-04-27 DIAGNOSIS — C7931 Secondary malignant neoplasm of brain: Secondary | ICD-10-CM | POA: Diagnosis not present

## 2014-04-27 DIAGNOSIS — Z510 Encounter for antineoplastic radiation therapy: Secondary | ICD-10-CM | POA: Diagnosis present

## 2014-04-27 DIAGNOSIS — Z853 Personal history of malignant neoplasm of breast: Secondary | ICD-10-CM | POA: Diagnosis not present

## 2014-04-28 ENCOUNTER — Encounter: Payer: Self-pay | Admitting: Radiation Oncology

## 2014-04-28 ENCOUNTER — Ambulatory Visit: Payer: BC Managed Care – PPO | Admitting: Radiation Oncology

## 2014-04-28 ENCOUNTER — Ambulatory Visit
Admit: 2014-04-28 | Discharge: 2014-04-28 | Disposition: A | Discharge status: Home / Self Care | Payer: BC Managed Care – PPO | Attending: Radiation Oncology | Admitting: Radiation Oncology

## 2014-04-28 DIAGNOSIS — Z510 Encounter for antineoplastic radiation therapy: Secondary | ICD-10-CM | POA: Diagnosis not present

## 2014-04-29 ENCOUNTER — Ambulatory Visit
Admit: 2014-04-29 | Discharge: 2014-04-29 | Disposition: A | Discharge status: Home / Self Care | Payer: BC Managed Care – PPO | Attending: Radiation Oncology | Admitting: Radiation Oncology

## 2014-04-29 ENCOUNTER — Other Ambulatory Visit: Payer: Self-pay | Admitting: Radiation Therapy

## 2014-04-29 ENCOUNTER — Encounter: Payer: Self-pay | Admitting: Radiation Oncology

## 2014-04-29 VITALS — BP 101/63 | HR 80 | Temp 98.3°F | Resp 16

## 2014-04-29 DIAGNOSIS — C7931 Secondary malignant neoplasm of brain: Secondary | ICD-10-CM

## 2014-04-29 DIAGNOSIS — Z510 Encounter for antineoplastic radiation therapy: Secondary | ICD-10-CM | POA: Diagnosis not present

## 2014-04-29 NOTE — Progress Notes (Addendum)
  Radiation Oncology         [phone] ________________________________  Name: Carolyn [name] MRN: [id number]  Date: 04/22/2014  DOB: [birth year]  SIMULATION AND TREATMENT PLANNING NOTE  DIAGNOSIS:  Metastatic breast cancer  NARRATIVE:  The patient was brought to the [facility].  Identity was confirmed.  All relevant records and images related to the planned course of therapy were reviewed.  The patient freely provided informed written consent to proceed with treatment after reviewing the details related to the planned course of therapy. The consent form was witnessed and verified by the simulation staff. Intravenous access was established for contrast administration. Then, the patient was set-up in a stable reproducible supine position for radiation therapy.  A relocatable thermoplastic stereotactic head frame was fabricated for precise immobilization.  CT images were obtained.  Surface markings were placed.  The CT images were loaded into the planning software and fused with the patient's targeting MRI scan.  Then the target and avoidance structures were contoured.  Treatment planning then occurred.  The radiation prescription was entered and confirmed.  I have requested 3D planning  I have requested a DVH of the following structures: Brain stem, brain, left eye, right I, lenses, optic chiasm, target volumes, uninvolved brain, and normal tissue.    PLAN:  The patient will receive 20 Gy in 1 fraction.   Special treatment procedure The patient will be treated with a course of radiosurgery. This requires extremely precise localization of the target and sophisticated, labor intensive treatment planning to achieve a highly focused radiation treatment plan. Due to the extra work involved in planning and delivery of such a procedure, this corresponds to a special treatment procedure.   ________________________________  [name], MD, PhD

## 2014-04-29 NOTE — Progress Notes (Signed)
  Name: Carolyn [name]  MRN: [id number]  Date: 04/29/2014   DOB: Nov 05, 1970  Stereotactic Radiosurgery Operative Note  PRE-OPERATIVE DIAGNOSIS:  Solitary Brain Metastasis  POST-OPERATIVE DIAGNOSIS:  Solitary Brain Metastasis  PROCEDURE:  Stereotactic Radiosurgery  SURGEON:  Earleen Newport, MD  NARRATIVE: The patient underwent a radiation treatment planning session in the radiation oncology simulation suite under the care of the radiation oncology physician and physicist.  I participated closely in the radiation treatment planning afterwards. The patient underwent planning CT which was fused to 3T high resolution MRI with 1 mm axial slices.  These images were fused on the planning system.  We contoured the gross target volumes and subsequently expanded this to yield the Planning Target Volume. I actively participated in the planning process.  I helped to define and review the target contours and also the contours of the optic pathway, eyes, brainstem and selected nearby organs at risk.  All the dose constraints for critical structures were reviewed and compared to AAPM Task Group 101.  The prescription dose conformity was reviewed.  I approved the plan electronically.    Accordingly, Carolyn [name] was brought to the TrueBeam stereotactic radiation treatment linac and placed in the custom immobilization mask.  The patient was aligned according to the IR fiducial markers with BrainLab Exactrac, then orthogonal x-rays were used in ExacTrac with the 6DOF robotic table and the shifts were made to align the patient  Carolyn [name] received stereotactic radiosurgery uneventfully.    The detailed description of the procedure is recorded in the radiation oncology procedure note.  I was present for the duration of the procedure.  DISPOSITION:  Following delivery, the patient was transported to nursing in stable condition and monitored for possible acute effects to be discharged to home in stable condition with  follow-up in one month.  Earleen Newport, MD 04/29/2014 9:21 AM

## 2014-04-29 NOTE — Discharge Summary (Signed)
Physician Discharge Summary  Patient ID: Carolyn [name] MRN: [id number] DOB/AGE: [birth year] 44 y.o.  Admit date: 04/20/2014 Discharge date: 04/25/2014  Discharge Diagnoses:  Principal Problem:   Myelopathy Active Problems:   Breast cancer of upper-outer quadrant of right female breast   Pathologic fracture of thoracic vertebrae   Metastatic cancer to spine   Discharged Condition: stable   Labs:  Basic Metabolic Panel: BMP Latest Ref Rng 04/21/2014 04/16/2014 04/13/2014  Glucose 70 - 99 mg/dL 133(H) 185(H) -  BUN 6 - 23 mg/dL 9 7 -  Creatinine 0.50 - 1.10 mg/dL 0.77 0.75 -  Sodium 135 - 145 mmol/L 141 137 142  Potassium 3.5 - 5.1 mmol/L 3.6 3.4(L) 3.7  Chloride 96 - 112 mmol/L 103 101 -  CO2 19 - 32 mmol/L 27 27 -  Calcium 8.4 - 10.5 mg/dL 9.5 7.9(L) -     CBC: CBC Latest Ref Rng 04/21/2014 04/19/2014 04/17/2014  WBC 4.0 - 10.5 K/uL 5.5 6.0 5.0  Hemoglobin 12.0 - 15.0 g/dL 10.8(L) 9.7(L) 6.4(LL)  Hematocrit 36.0 - 46.0 % 33.5(L) 29.3(L) 18.3(L)  Platelets 150 - 400 K/uL 264 171 125(L)     CBG: No results for input(s): GLUCAP in the last 168 hours.  Brief HPI:   Carolyn [name] is a 44 y.o. female with history of stage IV Breast cancer s/p mastectomy 09/2011 who presented to ED 04/12/2014 with 2 week history of numbness from umbilicus down progressing to difficulty walking with BLE weakness due to pathologic compression fracture of T8 with retropulsed bone and epidural tumor resulting cord compression, significant tumor involvement T7 and T9 and widespread osseous disease throughout thoracic and lumbar spine. 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 She was evaluated by Dr. Ellene Route and underwent laminectomy T8 with decompression of cord and T6-T10 fixation on 04/04. 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 TLSO ordered for support prior to  mobilization and she was transfused with 2 units PRBC for ABLA. She continues to have BLE weakness with ataxic gait and requires assistance with ADL tasks and CIR was recommended for follow up therapy.    Hospital Course: Carolyn [name] was admitted to rehab 04/20/2014 for inpatient therapies to consist of PT and OT at least three hours five days a week. Past admission physiatrist, therapy team and rehab RN have worked together to provide customized collaborative inpatient rehab.  Back incision is healing well without signs or symptoms of infection.  Follow up labs revealed ABLA improving with Hgb up to 10.8 and mild hypokalemia had resolved.  She was started on Oxycontin at nights to help with pain management during the day. Sennakot S was added to help with narcotic induced constipation with good results.  Low dose xanax was used on prn basis to help manage anxiety.  Follow up MRI of brain was done on 04/12 revealing moderate vasogenic edema.  Decadron was added per Dr. Ida Rogue recommendations and patient underwent stereotactic radiosurgery of solitary brain metastasis on 04/29/14.  Dr. Pollard/Neurospychology has followed for support and no indications of depression noted but patient expressed concerns about cognitive effects of radiation. She was encouraged to seek comprehensive neuropsychologist evaluation should she notice major changes in cognitive abilities.  Patient has made steady progress and is at modified independent to supervision level at discharge.   She will continue to receive follow up  HHPT by [facility] past discharge.  Rehab course: During patient's stay in rehab weekly team conferences were held to monitor patient's progress, set goals and discuss barriers to discharge. At admission, patient required moderate assistance with ADL tasks and min to moderate assistance with mobility. She  has had improvement in activity tolerance, balance, postural control, as well as ability to  compensate for deficits. She is has had improvement in functional use BLE. She is able to complete ADL tasks with supervision.  She is able to ambulate 150" with walker at modified independent level.   Family education was done with sister. Friends and sister will be checking in to provide assistance past discharge.     Disposition: 01-Home or Self Care  Diet: Regular.   Special Instructions: 1. No lifting, driving, or strenuous exercise till cleared by MD. Wear brace when out of bed.      Medication List    STOP taking these medications        gabapentin 100 MG capsule  Commonly known as:  NEURONTIN     HYDROcodone-acetaminophen 5-325 MG per tablet  Commonly known as:  NORCO/VICODIN     ibuprofen 200 MG tablet  Commonly known as:  ADVIL,MOTRIN     tamoxifen 20 MG tablet  Commonly known as:  NOLVADEX     venlafaxine XR 37.5 MG 24 hr capsule  Commonly known as:  EFFEXOR-XR      TAKE these medications        acetaminophen 325 MG tablet  Commonly known as:  TYLENOL  Take 2 tablets (650 mg total) by mouth every 6 (six) hours as needed for mild pain, moderate pain, fever or headache.     ALPRAZolam 0.25 MG tablet  Commonly known as:  XANAX  Take 1 tablet (0.25 mg total) by mouth 2 (two) times daily as needed for anxiety.     dexamethasone 4 MG tablet  Commonly known as:  DECADRON  Take 1 tablet (4 mg total) by mouth every 6 (six) hours. Take with food     iron polysaccharides 150 MG capsule  Commonly known as:  NIFEREX  Take 1 capsule (150 mg total) by mouth daily at 12 noon.     methocarbamol 500 MG tablet  Commonly known as:  ROBAXIN  Take 1 tablet (500 mg total) by mouth every 6 (six) hours as needed for muscle spasms.     multivitamin tablet  Take 1 tablet by mouth daily.     OxyCODONE 10 mg T12a 12 hr tablet  Commonly known as:  OXYCONTIN  Take 1 tablet (10 mg total) by mouth at bedtime.     oxyCODONE 5 MG immediate release tablet  Commonly known as:  Oxy  IR/ROXICODONE  Take 1-2 tablets (5-10 mg total) by mouth every 6 (six) hours as needed for severe pain.     senna-docusate 8.6-50 MG per tablet  Commonly known as:  Senokot-S  Take 2 tablets by mouth at bedtime.     Vitamin D3 2000 UNITS Tabs  Take 2,000 Units by mouth daily.           Follow-up Information    Follow up with Meredith Staggers, MD On 06/23/2014.   Specialty:  Physical Medicine and Rehabilitation   Why:  Be there at 10:00   for  10:20 am appointment    Contact information:   [address], [place] [address] [phone]       Follow up with Chauncey Cruel, MD On 05/11/2014.   Specialty:  Oncology   Why:  1pm for lab/ 1:30 pm for follow up appointment    Contact information:   [place] [address] [phone]       Follow up with Marye Round, MD.   Specialty:  Radiation Oncology   Why:  follow up for radiation treatment   Contact information:   [address] [place] [place] [address] [phone]       Follow up with Earleen Newport, MD. Call today.   Specialty:  Neurosurgery   Why:  for follow up appointment   Contact information:   [address] [place] [address] [phone]       Follow up with [name], MD On 05/01/2014.   Specialty:  Family Medicine   Why:  @ 8:30 am  (24 hour notice if need to change appointment please)   Contact information:   [address] [address] [phone]       Signed: Bary Leriche 04/29/2014, 2:51 PM

## 2014-04-29 NOTE — Progress Notes (Signed)
  Radiation Oncology         [phone] ________________________________  Name: Carolyn [name] MRN: [id number]  Date: 04/29/2014  DOB: 1970/08/13   SPECIAL TREATMENT PROCEDURE   3D TREATMENT PLANNING AND DOSIMETRY: The patient's radiation plan was reviewed and approved by Dr. Ellene Route from neurosurgery and radiation oncology prior to treatment. It showed 3-dimensional radiation distributions overlaid onto the planning CT/MRI image set. The [facility] for the target structures as well as the organs at risk were reviewed. The documentation of the 3D plan and dosimetry are filed in the radiation oncology EMR.   NARRATIVE: The patient was brought to the TrueBeam stereotactic radiation treatment machine and placed supine on the CT couch. The head frame was applied, and the patient was set up for stereotactic radiosurgery. Neurosurgery was present for the set-up and delivery   SIMULATION VERIFICATION: In the couch zero-angle position, the patient underwent Exactrac imaging using the Brainlab system with orthogonal KV images. These were carefully aligned and repeated to confirm treatment position for each of the isocenters. The Exactrac snap film verification was repeated at each couch angle.   SPECIAL TREATMENT PROCEDURE: The patient received stereotactic radiosurgery to the following targets:  1.  PTV1 Lt Post Centrum Semiovale 71mm target was treated using 2 Arcs to a prescription dose of 20 Gy. ExacTrac Snap verification was performed for each couch angle.   STEREOTACTIC TREATMENT MANAGEMENT: Following delivery, the patient was transported to nursing in stable condition and monitored for possible acute effects. Vital signs were recorded . The patient tolerated treatment without significant acute effects, and was discharged to home in stable condition.  PLAN: Continue palliative radiation to the spine.   ------------------------------------------------  [name], MD, PhD

## 2014-04-29 NOTE — Progress Notes (Signed)
Patient does not have any complaints.  Vitals within normal limits.  Patient left the clinic escorted by her nephew.

## 2014-04-29 NOTE — Progress Notes (Signed)
Patient denies pain, headache, dizziness, vision changes and nausea.  Vital signs within normal limits.

## 2014-04-30 ENCOUNTER — Ambulatory Visit
Admit: 2014-04-30 | Discharge: 2014-04-30 | Disposition: A | Discharge status: Home / Self Care | Payer: BC Managed Care – PPO | Attending: Radiation Oncology | Admitting: Radiation Oncology

## 2014-04-30 DIAGNOSIS — C50919 Malignant neoplasm of unspecified site of unspecified female breast: Secondary | ICD-10-CM | POA: Insufficient documentation

## 2014-04-30 DIAGNOSIS — C7931 Secondary malignant neoplasm of brain: Secondary | ICD-10-CM

## 2014-04-30 DIAGNOSIS — Z510 Encounter for antineoplastic radiation therapy: Secondary | ICD-10-CM | POA: Diagnosis not present

## 2014-04-30 NOTE — Consult Note (Signed)
NEUROCOGNITIVE [facility]   Ms. Shelene Krage is a 44 year old woman, who was seen for a brief neurocognitive status examination to evaluate her emotional state and mental status in the setting of metastatic cancer.  According to her medical record, she has a history of stage IV breast cancer in September, 2013.  She presented to the emergency department on 04/12/14 with a 2 week history of numbness from umbilicus down progressing to difficulty walking with bilateral lower extremity weakness.  MRI of her spine revealed epidural tumor with resulting cord compression.  MRI of the brain revealed an extra-axial intracranial metastasis in the left centrum ovale with marked surrounding edema and question of mets to clivus and calvarium.  Biopsy was positive for metastatic breast cancer.    Emotional Functioning:  During the clinical interview, Ms. Satterwhite described optimism overall, but acknowledged some of the emotional difficulties that come with recurring cancer.  Time was spent processing her emotional reactions and in evaluating coping strategies that she has found to be helpful.  She stated that she typically relies on prayer and reading scriptures in order to give her strength.  She also said that relying on her social support network has been beneficial in improving her mood.  Ms. Biederman stated that immediately upon diagnosis, she felt depressed and had some passive suicidal ideation, though she adamantly denied plan or intent for self-harm and stated that currently, she no longer has even passive ideation.  Ms. Siverling cited her biggest concerns as potential cognitive changes following brain radiation and surrounding paying her medical bills.  She stated that she plans to spend more time getting organized (e.g. making lists, etc.) in order to be proactive about any potential cognitive side effects of cancer treatment.  Ms. Cassell responses to self-report  measures of mood symptoms were not suggestive of clinically significant depression at this time.    Mental Status:  Ms. Sow total score on a very brief measure of mental status was not suggestive of the presence of significant cognitive disruption at this time (MMSE-2 brief = 15/16).  She lost one point for freely recalling only 2 of 3 previously studied words after a brief delay.  Subjectively, she denied noticing cognitive changes as of yet, though as stated above, expressed concern about the potential to develop cognitive changes post treatment.    Impressions and Recommendations:  Ms. Wareing score on a very brief measure of mental status was not suggestive of significant cognitive disruption.  The risks for development of cognitive changes in the case of focused brain radiation were discussed and Ms. Rosene appeared to understand.  She was encouraged to seek a comprehensive neuropsychological evaluation as an outpatient following treatment should she notice major changes in cognitive abilities.  Contact information on a neuropsychologist in her area should be included in her discharge paperwork for this purpose.  From an emotional standpoint, she expressed some significant worries and stressors, but seems to be coping well with them at this time.  There were no indications of clinically significant depression, though behaviorally, Ms. Badia presented as somewhat guarded and therefore, it could be the case that she has more emotional distress than she was letting on.  Still, given that she seems to be coping well at this time and is not reporting significant impacts in aspects of her life based on mood disruption, she likely does not require medication intervention for management of mood.  She might benefit from participation in individual psychotherapy  post-discharge, though notably, she did not seem open to that at this time.  Still, information on providers in her area should be included in her  discharge paperwork in case she needs this in the future.    DIAGNOSES:   Brain metastasis  Marlane Hatcher, Psy.D.  Clinical Neuropsychologist

## 2014-05-01 ENCOUNTER — Ambulatory Visit
Admission: RE | Admit: 2014-05-01 | Discharge: 2014-05-01 | Disposition: A | Discharge status: Home / Self Care | Payer: BC Managed Care – PPO | Source: Ambulatory Visit | Attending: Radiation Oncology | Admitting: Radiation Oncology

## 2014-05-01 ENCOUNTER — Ambulatory Visit
Admit: 2014-05-01 | Discharge: 2014-05-01 | Disposition: A | Discharge status: Home / Self Care | Payer: BC Managed Care – PPO | Attending: Radiation Oncology | Admitting: Radiation Oncology

## 2014-05-01 ENCOUNTER — Ambulatory Visit: Payer: BC Managed Care – PPO | Admitting: Radiation Oncology

## 2014-05-01 VITALS — BP 110/71 | HR 85 | Resp 16 | Wt 126.0 lb

## 2014-05-01 DIAGNOSIS — C7931 Secondary malignant neoplasm of brain: Secondary | ICD-10-CM

## 2014-05-01 DIAGNOSIS — Z510 Encounter for antineoplastic radiation therapy: Secondary | ICD-10-CM | POA: Diagnosis not present

## 2014-05-01 NOTE — Progress Notes (Signed)
   Department of Radiation Oncology  Phone:  [phone] Fax:        [phone]  Weekly Treatment Note    Name: Carolyn [name] Date: 05/01/2014 MRN: [id number] DOB: 09/19/1970   Current dose: 15 Gy  Current fraction: 5   MEDICATIONS: Current Outpatient Prescriptions  Medication Sig Dispense Refill  . acetaminophen (TYLENOL) 325 MG tablet Take 2 tablets (650 mg total) by mouth every 6 (six) hours as needed for mild pain, moderate pain, fever or headache. (Patient not taking: Reported on 04/12/2014)    . ALPRAZolam (XANAX) 0.25 MG tablet Take 1 tablet (0.25 mg total) by mouth 2 (two) times daily as needed for anxiety. 15 tablet 0  . Cholecalciferol (VITAMIN D3) 2000 UNITS TABS Take 2,000 Units by mouth daily.    Marland Kitchen dexamethasone (DECADRON) 4 MG tablet Take 1 tablet (4 mg total) by mouth every 6 (six) hours. Take with food 120 tablet 0  . iron polysaccharides (NIFEREX) 150 MG capsule Take 1 capsule (150 mg total) by mouth daily at 12 noon. 30 capsule 1  . methocarbamol (ROBAXIN) 500 MG tablet Take 1 tablet (500 mg total) by mouth every 6 (six) hours as needed for muscle spasms. 60 tablet 0  . Multiple Vitamin (MULTIVITAMIN) tablet Take 1 tablet by mouth daily.    Marland Kitchen oxyCODONE (OXY IR/ROXICODONE) 5 MG immediate release tablet Take 1-2 tablets (5-10 mg total) by mouth every 6 (six) hours as needed for severe pain. 90 tablet 0  . OxyCODONE (OXYCONTIN) 10 mg T12A 12 hr tablet Take 1 tablet (10 mg total) by mouth at bedtime. 30 tablet 0  . senna-docusate (SENOKOT-S) 8.6-50 MG per tablet Take 2 tablets by mouth at bedtime. 60 tablet 1   No current facility-administered medications for this encounter.   Facility-Administered Medications Ordered in Other Encounters  Medication Dose Route Frequency Provider Last Rate Last Dose  . LORazepam (ATIVAN) tablet 0.5 mg  0.5 mg Oral Once Kyung Rudd, MD         ALLERGIES: Allegra and Shellfish allergy   LABORATORY DATA:  Lab Results  Component  Value Date   WBC 5.5 04/21/2014   HGB 10.8* 04/21/2014   HCT 33.5* 04/21/2014   MCV 86.1 04/21/2014   PLT 264 04/21/2014   Lab Results  Component Value Date   NA 141 04/21/2014   K 3.6 04/21/2014   CL 103 04/21/2014   CO2 27 04/21/2014   Lab Results  Component Value Date   ALT 19 04/21/2014   AST 26 04/21/2014   ALKPHOS 99 04/21/2014   BILITOT 0.5 04/21/2014     NARRATIVE: Carolyn [name] was seen today for weekly treatment management. The chart was checked and the patient's films were reviewed.  Slow shuffling gait noted with aid of rolling walker. Reports pain is well managed with OTC medications. Denies numbness or tingling in arms or legs. Reports having a normal formed bowel movement yesterday but, concerned about bloating. Denies urinary incontinence.   The patient's back pain is improved. She is pleased with how she is doing in terms of walking better.  PHYSICAL EXAMINATION: weight is 126 lb (57.153 kg). Her blood pressure is 110/71 and her pulse is 85. Her respiration is 16.        ASSESSMENT: The patient is doing satisfactorily with treatment.  PLAN: We will continue with the patient's radiation treatment as planned.

## 2014-05-01 NOTE — Progress Notes (Signed)
Slow shuffling gait noted with aid of rolling walker. Reports pain is well managed with OTC medications. Denies numbness or tingling in arms or legs. Reports having a normal formed bowel movement yesterday but, concerned about bloating. Denies urinary incontinence.

## 2014-05-04 ENCOUNTER — Encounter (HOSPITAL_COMMUNITY): Payer: Self-pay

## 2014-05-04 ENCOUNTER — Ambulatory Visit
Admit: 2014-05-04 | Discharge: 2014-05-04 | Disposition: A | Discharge status: Home / Self Care | Payer: BC Managed Care – PPO | Attending: Radiation Oncology | Admitting: Radiation Oncology

## 2014-05-04 ENCOUNTER — Ambulatory Visit (HOSPITAL_COMMUNITY): Admission: RE | Admit: 2014-05-04 | Payer: BC Managed Care – PPO | Source: Ambulatory Visit

## 2014-05-04 ENCOUNTER — Ambulatory Visit (HOSPITAL_COMMUNITY)
Admission: RE | Admit: 2014-05-04 | Discharge: 2014-05-04 | Disposition: A | Discharge status: Home / Self Care | Payer: BC Managed Care – PPO | Source: Ambulatory Visit | Attending: Radiation Oncology | Admitting: Radiation Oncology

## 2014-05-04 DIAGNOSIS — R930 Abnormal findings on diagnostic imaging of skull and head, not elsewhere classified: Secondary | ICD-10-CM | POA: Diagnosis not present

## 2014-05-04 DIAGNOSIS — Z510 Encounter for antineoplastic radiation therapy: Secondary | ICD-10-CM | POA: Diagnosis not present

## 2014-05-04 DIAGNOSIS — C50919 Malignant neoplasm of unspecified site of unspecified female breast: Secondary | ICD-10-CM | POA: Diagnosis present

## 2014-05-04 DIAGNOSIS — C7931 Secondary malignant neoplasm of brain: Secondary | ICD-10-CM | POA: Diagnosis not present

## 2014-05-04 MED ORDER — IOHEXOL 300 MG/ML  SOLN
100.0000 mL | Freq: Once | INTRAMUSCULAR | Status: AC | PRN
  Administered 2014-05-04: 75 mL via INTRAVENOUS

## 2014-05-05 ENCOUNTER — Ambulatory Visit
Admit: 2014-05-05 | Discharge: 2014-05-05 | Disposition: A | Discharge status: Home / Self Care | Payer: BC Managed Care – PPO | Attending: Radiation Oncology | Admitting: Radiation Oncology

## 2014-05-05 ENCOUNTER — Telehealth: Payer: Self-pay | Admitting: *Deleted

## 2014-05-05 DIAGNOSIS — Z510 Encounter for antineoplastic radiation therapy: Secondary | ICD-10-CM | POA: Diagnosis not present

## 2014-05-05 NOTE — Telephone Encounter (Signed)
Carolyn [name] has called to find out about getting PT services.  She was told she was getting [facility] but has heard from no one.

## 2014-05-06 ENCOUNTER — Ambulatory Visit
Admit: 2014-05-06 | Discharge: 2014-05-06 | Disposition: A | Discharge status: Home / Self Care | Payer: BC Managed Care – PPO | Attending: Radiation Oncology | Admitting: Radiation Oncology

## 2014-05-06 DIAGNOSIS — Z510 Encounter for antineoplastic radiation therapy: Secondary | ICD-10-CM | POA: Diagnosis not present

## 2014-05-06 NOTE — Telephone Encounter (Signed)
Message was sent to Lennart Pall MSW  Discharge note said services ordered.

## 2014-05-07 ENCOUNTER — Ambulatory Visit
Admit: 2014-05-07 | Discharge: 2014-05-07 | Disposition: A | Discharge status: Home / Self Care | Payer: BC Managed Care – PPO | Attending: Radiation Oncology | Admitting: Radiation Oncology

## 2014-05-07 DIAGNOSIS — Z510 Encounter for antineoplastic radiation therapy: Secondary | ICD-10-CM | POA: Diagnosis not present

## 2014-05-08 ENCOUNTER — Ambulatory Visit
Admission: RE | Admit: 2014-05-08 | Discharge: 2014-05-08 | Disposition: A | Discharge status: Home / Self Care | Payer: BC Managed Care – PPO | Source: Ambulatory Visit | Attending: Radiation Oncology | Admitting: Radiation Oncology

## 2014-05-08 ENCOUNTER — Encounter: Payer: Self-pay | Admitting: Radiation Oncology

## 2014-05-08 VITALS — BP 116/72 | HR 95 | Temp 98.2°F | Resp 20 | Wt 127.6 lb

## 2014-05-08 DIAGNOSIS — C7931 Secondary malignant neoplasm of brain: Secondary | ICD-10-CM

## 2014-05-08 DIAGNOSIS — Z510 Encounter for antineoplastic radiation therapy: Secondary | ICD-10-CM | POA: Diagnosis not present

## 2014-05-08 DIAGNOSIS — Z923 Personal history of irradiation: Secondary | ICD-10-CM

## 2014-05-08 HISTORY — DX: Personal history of irradiation: Z92.3

## 2014-05-08 NOTE — Progress Notes (Signed)
Weekly rad txs T-spine 10/10 completd, wears a brace, no pain, nausea or diarrhea, is still having some bloating in abdomen, appetite good, 1 month f/u appt card  given 11:12 AM BP 116/72 mmHg  Pulse 95  Temp(Src) 98.2 F (36.8 C) (Oral)  Resp 20  Wt 127 lb 9.6 oz (57.879 kg)  Wt Readings from Last 3 Encounters:  05/01/14 126 lb (57.153 kg)  04/20/14 133 lb 9.6 oz (60.6 kg)  04/13/14 128 lb 15.5 oz (58.5 kg)

## 2014-05-08 NOTE — Progress Notes (Signed)
  Radiation Oncology         [phone] ________________________________  Name: Carolyn [name] MRN: [id number]  Date: 05/08/2014  DOB: [birth year]  Weekly Radiation Therapy Management    ICD-9-CM ICD-10-CM   1. Brain metastasis 198.3 C79.31     Current Dose: 30 Gy     Planned Dose:  30 Gy  Narrative . . . . . . . . The patient presents for routine under treatment assessment. The pt states that she is taking her pain meds as prescribed. Looks forwards to get the brace off. The pt is taking dexamethasone 3 times a day.                                 The patient is without complaint.                                 Set-up films were reviewed.                                 The chart was checked.  Physical Findings. . .  weight is 127 lb 9.6 oz (57.879 kg). Her oral temperature is 98.2 F (36.8 C). Her blood pressure is 116/72 and her pulse is 95. Her respiration is 20. . Weight essentially stable.  No significant changes. Impression . . . . . . . The patient is tolerating radiation. Plan . . . . . . . . . . . . Continue treatment as planned. Scale back the dexamethasone 2 times a day for 2 weeks, then 1 times a day for 2 weeks, and then stop.  This document serves as a record of services personally performed by Tyler Pita, MD. It was created on his behalf by Darcus Austin, a trained medical scribe. The creation of this record is based on the scribe's personal observations and the provider's statements to them. This document has been checked and approved by the attending provider.    ________________________________  Sheral Apley. Tammi Klippel, M.D.

## 2014-05-11 ENCOUNTER — Ambulatory Visit (HOSPITAL_BASED_OUTPATIENT_CLINIC_OR_DEPARTMENT_OTHER): Payer: BC Managed Care – PPO | Admitting: Oncology

## 2014-05-11 ENCOUNTER — Telehealth: Payer: Self-pay | Admitting: Oncology

## 2014-05-11 ENCOUNTER — Ambulatory Visit: Payer: BC Managed Care – PPO

## 2014-05-11 ENCOUNTER — Other Ambulatory Visit (HOSPITAL_BASED_OUTPATIENT_CLINIC_OR_DEPARTMENT_OTHER): Payer: BC Managed Care – PPO

## 2014-05-11 VITALS — BP 106/67 | HR 98 | Temp 98.4°F | Resp 18 | Ht 68.0 in | Wt 125.1 lb

## 2014-05-11 DIAGNOSIS — C50919 Malignant neoplasm of unspecified site of unspecified female breast: Secondary | ICD-10-CM

## 2014-05-11 DIAGNOSIS — C773 Secondary and unspecified malignant neoplasm of axilla and upper limb lymph nodes: Secondary | ICD-10-CM

## 2014-05-11 DIAGNOSIS — K769 Liver disease, unspecified: Secondary | ICD-10-CM

## 2014-05-11 DIAGNOSIS — C50411 Malignant neoplasm of upper-outer quadrant of right female breast: Secondary | ICD-10-CM

## 2014-05-11 DIAGNOSIS — C50811 Malignant neoplasm of overlapping sites of right female breast: Secondary | ICD-10-CM

## 2014-05-11 DIAGNOSIS — C7931 Secondary malignant neoplasm of brain: Secondary | ICD-10-CM | POA: Diagnosis not present

## 2014-05-11 DIAGNOSIS — C7951 Secondary malignant neoplasm of bone: Secondary | ICD-10-CM

## 2014-05-11 DIAGNOSIS — R918 Other nonspecific abnormal finding of lung field: Secondary | ICD-10-CM

## 2014-05-11 DIAGNOSIS — Z170 Estrogen receptor positive status [ER+]: Secondary | ICD-10-CM

## 2014-05-11 LAB — CBC WITH DIFFERENTIAL/PLATELET
BASO%: 0.1 % (ref 0.0–2.0)
Basophils Absolute: 0 10*3/uL (ref 0.0–0.1)
EOS%: 0 % (ref 0.0–7.0)
Eosinophils Absolute: 0 10*3/uL (ref 0.0–0.5)
HEMATOCRIT: 42.1 % (ref 34.8–46.6)
HGB: 13.6 g/dL (ref 11.6–15.9)
LYMPH%: 2.3 % — AB (ref 14.0–49.7)
MCH: 28.3 pg (ref 25.1–34.0)
MCHC: 32.2 g/dL (ref 31.5–36.0)
MCV: 87.7 fL (ref 79.5–101.0)
MONO#: 0.7 10*3/uL (ref 0.1–0.9)
MONO%: 6.9 % (ref 0.0–14.0)
NEUT#: 8.7 10*3/uL — ABNORMAL HIGH (ref 1.5–6.5)
NEUT%: 90.7 % — AB (ref 38.4–76.8)
Platelets: 155 10*3/uL (ref 145–400)
RBC: 4.8 10*6/uL (ref 3.70–5.45)
RDW: 17.3 % — AB (ref 11.2–14.5)
WBC: 9.6 10*3/uL (ref 3.9–10.3)
lymph#: 0.2 10*3/uL — ABNORMAL LOW (ref 0.9–3.3)

## 2014-05-11 LAB — COMPREHENSIVE METABOLIC PANEL (CC13)
ALBUMIN: 3.6 g/dL (ref 3.5–5.0)
ALK PHOS: 123 U/L (ref 40–150)
ALT: 29 U/L (ref 0–55)
ANION GAP: 12 meq/L — AB (ref 3–11)
AST: 18 U/L (ref 5–34)
BUN: 19.7 mg/dL (ref 7.0–26.0)
CO2: 28 mEq/L (ref 22–29)
CREATININE: 0.7 mg/dL (ref 0.6–1.1)
Calcium: 9.3 mg/dL (ref 8.4–10.4)
Chloride: 101 mEq/L (ref 98–109)
EGFR: >90 mL/min/{1.73_m2} (ref >90)
Glucose: 123 mg/dl (ref 70–140)
Potassium: 3.8 mEq/L (ref 3.5–5.1)
SODIUM: 141 meq/L (ref 136–145)
Total Bilirubin: 0.51 mg/dL (ref 0.20–1.20)
Total Protein: 6.8 g/dL (ref 6.4–8.3)

## 2014-05-11 MED ORDER — PALBOCICLIB 125 MG PO CAPS: 125.0000 mg | ORAL_CAPSULE | Freq: Every day | ORAL | Status: DC

## 2014-05-11 NOTE — Progress Notes (Signed)
[facility]  [phone] [phone]) [phone]     ID: Carolyn [name] DOB: December 06, 1970  MR#: [id number]  AQT#:[id number]  Patient Care Team: [name], MD as PCP - General (Family Medicine) Thea Silversmith, MD (Radiation Oncology) Neldon Mc, MD as Surgeon (General Surgery) Chauncey Cruel, MD as Consulting Physician (Oncology) OTHER M.D.JN Collene Mares  CHIEF COMPLAINT: Estrogen receptor positive breast cancer  CURRENT TREATMENT: fulvestrant, palbociclib, zolendronate   BREAST CANCER HISTORY: From doctor Khan's of original intake node 05/31/2011:  "Carolyn [name] is a 44 y.o. female. Without significant past medical history. She underwent a mammogram that showed calcifications measuring 5 cm on the right breast. She then went on To have an ultrasound which showed the area to measure 2.9 cm. She was also found to have a right axillary lymph node that was suspicious for a malignancy. She had a biopsy of the calcifications that showed high-grade ductal carcinoma in situ. 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 In the lymph node biopsy there was no lymphatic tissue seen and was felt that the node was replaced by tumor. Patient went on to have an MRI of the bilateral breasts performed on evening of 05/30/2011. The MRI showed in the right breast 5 x 3 x 4.5 cm mass abutting the chest wall. About 7 cm away from this there was another mass in the upper inner quadrant that measured 1.5 x 1.7 x 1.5 cm. On the contralateral breast, that is the left breast a 2 cm area suspicious enhancement was noted also this up to date has not been biopsied and arrangements are being made for the biopsy to be performed. In this side there were no suspicious lymph nodes. The prognostic panel is pending. Patient is otherwise without any complaints. "  METASTATIC DISEASE: Aljean noted some strange feelings around her umbilicus 56/25/6389. This felt like  an area of numbness. Over the next 2 days she noted some leg weakness and difficulty walking, so she presented to the ED 04/12/2014. MRI of the thoraco-lumbar spine was obtained 04/12/2014 showing multiple bone lesions and compression fracture at T8 with retropulsion and cord compression. On 04/13/2014 she underwent Laminectomy T8 decompression of spinal cord posterior fixation from T6-T10 with pedicle screws and rods posterior arthrodesis with allograft. 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 HER-2 could not be obtained.  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 There were nonspecific L temporal bone changes and also possible involvement of the clivus and calvarium, but no other parenchymal brain lesions. Further staging studies included a bone scan, which failed to show the lytic lesion seen on other scans, and CTs of the chest, abdomen and pelvis on 06/08/2011, which showed very small right lung and left liver lesions which will require follow-up  Her subsequent history is as detailed below  INTERVAL HISTORY: Carolyn [name] returns today for followup of her stage IV breast cancer. Since her last visit here she underwent stereotactic radiosurgery to the single brain lesion 04/29/2014. She also has received radiation to the spine, I believe 30 gray, completed 05/08/2014. After her surgery, she went to a rehabilitation facility but is now back home. Home health PT has not yet started. She is on dexamethasone 4 mg twice daily with a taper in place.  REVIEW OF SYSTEMS: Carolyn [name] feels the strength in her legs is much better although not "the way it was before". She is using her walker more for balance than anything else. They have been no falls. She denies headaches,  visual changes, nausea, vomiting, dizziness, or gait imbalance issues. Sensation in her lower extremities is "coming back". She notices tingling and a little bit of numbness in the distal feet bilaterally. She is using a stool softener and with that she has a bowel movement every other day. She has full urine control. She tolerated radiation well, without unusual fatigue or other side effects that she is aware of. A detailed review of systems today was otherwise stable  PAST MEDICAL HISTORY: Past Medical History  Diagnosis Date  . 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 Hx of radiation therapy 11/08/11 -12/26/11    right chest wall/supraclav fossa, right scar  . History of chemotherapy 06/27/11 -09/04/11    neoadjuvant    PAST SURGICAL HISTORY: Past Surgical History  Procedure Laterality Date  . Wisdom tooth extraction    . Portacath placement  06/20/2011    Procedure: INSERTION PORT-A-CATH;  Surgeon: Haywood Lasso, MD;  Location: [facility];  Service: General;  Laterality: N/A;  . Modified mastectomy  10/03/2011    Procedure: MODIFIED MASTECTOMY;  Surgeon: Haywood Lasso, MD;  Location: [facility];  Service: General;  Laterality: Right;  . Port-a-cath removal  01/30/2012    Procedure: MINOR REMOVAL PORT-A-CATH;  Surgeon: Haywood Lasso, MD;  Location: [facility];  Service: General;  Laterality: Left;  . Breast biopsy      Left  . Robotic assisted total hysterectomy with bilateral salpingo oopherectomy Bilateral 12/23/2012    Procedure: ROBOTIC ASSISTED TOTAL HYSTERECTOMY WITH BILATERAL SALPINGO OOPHORECTOMY;  Surgeon: Marvene Staff, MD;  Location: [facility];  Service: Gynecology;  Laterality: Bilateral;  . Cholecystectomy N/A 03/01/2014    Procedure: LAPAROSCOPIC CHOLECYSTECTOMY;  Surgeon: Leighton Ruff, MD;  Location: [facility];  Service: General;  Laterality: N/A;  . Laminectomy N/A 04/13/2014    Procedure:  THORACIC LAMINECTOMY WITH FIXATION THORACIC SIX-THORACIC TEN FUSION;  Surgeon: Kristeen Miss, MD;  Location: [facility];  Service: Neurosurgery;  Laterality: N/A;    FAMILY HISTORY Family History  Problem Relation Age of Onset  . Breast cancer Maternal Aunt 53  . Pancreatic cancer Maternal Grandfather   . Pancreatic cancer Maternal Aunt     died in her 51s  . Leukemia Maternal Aunt     died in her 16s  . Ovarian cancer Cousin 75    maternal cousin   the patient's father is alive, currently 58 years old. The patient's mother died from complications of diabetes at the age of 71. The patient had no brothers, 4 sisters. One sister has died from congestive heart failure. The patient's mother is one of 5 sisters. One of them was diagnosed with breast cancer the age of 6. Also a cousin on the maternal side it was diagnosed with ovarian cancer. This was approximately age 76. There is also colon cancer in the family. The patient has had extensive genetic testing summarized below. She is however BRCA negative  GYNECOLOGIC HISTORY:  No LMP recorded. Patient is not currently having periods (Reason: Chemotherapy). Menarche age 25, she is GX P0. She underwent total hysterectomy with bilateral salpingo-oophorectomy December 2014.  SOCIAL HISTORY:  Carolyn [name] works at [employer] for [employer]. She mostly  deals with government documents. She is single, lives by herself, with no pets. She attends [employer] locally.    ADVANCED DIRECTIVES: Not in place. The patient has a documents and intends to name her sister Carolyn [name] as healthcare power of attorney. Carolyn [name] lives in [place] and can be reached at [place]: History  Substance Use Topics  . Smoking status: Never Smoker   . Smokeless tobacco: Never Used  . Alcohol Use: No     Colonoscopy:  PAP:  Bone density:  Lipid panel:  Allergies  Allergen Reactions  . Allegra [Fexofenadine] Hives    Abdomen only  . Shellfish  Allergy Hives    Abdomen only    Current Outpatient Prescriptions  Medication Sig Dispense Refill  . acetaminophen (TYLENOL) 325 MG tablet Take 2 tablets (650 mg total) by mouth every 6 (six) hours as needed for mild pain, moderate pain, fever or headache. (Patient not taking: Reported on 04/12/2014)    . Cholecalciferol (VITAMIN D3) 2000 UNITS TABS Take 2,000 Units by mouth daily.    Marland Kitchen dexamethasone (DECADRON) 4 MG tablet Take 1 tablet (4 mg total) by mouth 2 (two) times daily. Take with food 120 tablet 0  . iron polysaccharides (NIFEREX) 150 MG capsule Take 1 capsule (150 mg total) by mouth daily at 12 noon. 30 capsule 1  . Multiple Vitamin (MULTIVITAMIN) tablet Take 1 tablet by mouth daily.    . palbociclib (IBRANCE) 125 MG capsule Take 1 capsule (125 mg total) by mouth daily with breakfast. Take whole with food. 21 capsule 6  . senna-docusate (SENOKOT-S) 8.6-50 MG per tablet Take 2 tablets by mouth at bedtime. 60 tablet 1   No current facility-administered medications for this visit.    OBJECTIVE: Middle-aged [place] American woman using a walker Filed Vitals:   05/11/14 1325  BP: 106/67  Pulse: 98  Temp: 98.4 F (36.9 C)  Resp: 18     Body mass index is 19.03 kg/(m^2).    ECOG FS:2 - Symptomatic, <50% confined to bed  Sclerae unicteric, pupils round and equal, EOMs intact Oropharynx clear,and moist No cervical or supraclavicular adenopathy Lungs no rales or rhonchi Heart regular rate and rhythm Abd she is wearing a chest brace which makes abdominal examination limited MSK no upper extremity lymphedema Neuro: 5 over 5 all muscle groups tested and particularly 5 over 5 dorsiflexion bilaterally; well-oriented; positive affect Breasts: Sdeferred   LAB RESULTS:  CMP     Component Value Date/Time   NA 141 05/11/2014 1310   NA 141 04/21/2014 0910   K 3.8 05/11/2014 1310   K 3.6 04/21/2014 0910   CL 103 04/21/2014 0910   CL 102 01/16/2012 0804   CO2 28 05/11/2014 1310    CO2 27 04/21/2014 0910   GLUCOSE 123 05/11/2014 1310   GLUCOSE 133* 04/21/2014 0910   GLUCOSE 92 01/16/2012 0804   BUN 19.7 05/11/2014 1310   BUN 9 04/21/2014 0910   CREATININE 0.7 05/11/2014 1310   CREATININE 0.77 04/21/2014 0910   CALCIUM 9.3 05/11/2014 1310   CALCIUM 9.5 04/21/2014 0910   PROT 6.8 05/11/2014 1310   PROT 6.3 04/21/2014 0910   ALBUMIN 3.6 05/11/2014 1310   ALBUMIN 3.1* 04/21/2014 0910   AST 18 05/11/2014 1310   AST 26 04/21/2014 0910   ALT 29 05/11/2014 1310   ALT 19 04/21/2014 0910   ALKPHOS 123 05/11/2014 1310   ALKPHOS 99 04/21/2014 0910   BILITOT 0.51 05/11/2014 1310  BILITOT 0.5 04/21/2014 0910   GFRNONAA >90 04/21/2014 0910   GFRAA >90 04/21/2014 0910    I No results found for: SPEP  Lab Results  Component Value Date   WBC 9.6 05/11/2014   NEUTROABS 8.7* 05/11/2014   HGB 13.6 05/11/2014   HCT 42.1 05/11/2014   MCV 87.7 05/11/2014   PLT 155 05/11/2014      Chemistry      Component Value Date/Time   NA 141 05/11/2014 1310   NA 141 04/21/2014 0910   K 3.8 05/11/2014 1310   K 3.6 04/21/2014 0910   CL 103 04/21/2014 0910   CL 102 01/16/2012 0804   CO2 28 05/11/2014 1310   CO2 27 04/21/2014 0910   BUN 19.7 05/11/2014 1310   BUN 9 04/21/2014 0910   CREATININE 0.7 05/11/2014 1310   CREATININE 0.77 04/21/2014 0910      Component Value Date/Time   CALCIUM 9.3 05/11/2014 1310   CALCIUM 9.5 04/21/2014 0910   ALKPHOS 123 05/11/2014 1310   ALKPHOS 99 04/21/2014 0910   AST 18 05/11/2014 1310   AST 26 04/21/2014 0910   ALT 29 05/11/2014 1310   ALT 19 04/21/2014 0910   BILITOT 0.51 05/11/2014 1310   BILITOT 0.5 04/21/2014 0910       Lab Results  Component Value Date   LABCA2 24 05/31/2011    No components found for: [id number]  No results for input(s): INR in the last 168 hours.  Urinalysis No results found for: COLORURINE  STUDIES: Dg Thoracic Spine 2 View  04/13/2014   CLINICAL DATA:  T6 through T10 laminectomy and fusion.   EXAM: THORACIC SPINE - 2 VIEW; DG C-ARM 61-120 MIN  COMPARISON:  04/12/2014  FINDINGS: Posterolateral rod and pedicle screw fixation noted at T6-T7-T9-T10, with a cross leakage at the T8 vertebral level but no pedicle screw at the T8 vertebral level. T8 compression fracture observed.  IMPRESSION: 1. Posterolateral rod and pedicle screw fixation at P9-X5-A5-W97, without complicating feature or malpositioning identified. 2. Pathologic T8 compression fracture.   Electronically Signed   By: Van Clines M.D.   On: 04/13/2014 16:03   Dg Thoracic Spine 2 View  04/13/2014   CLINICAL DATA:  Pathologic compression of the T8 vertebral body with retropulsion. Intraoperative localization.  EXAM: THORACIC SPINE - 2 VIEW  COMPARISON:  MRI of the thoracic spine on 04/12/2014  FINDINGS: Two separate intraoperative films demonstrate needle placement along the lower margin of the T8 vertebral body. The vertebral body is visibly compressed relative to the T7 and T9 levels.  IMPRESSION: Intraoperative localization along the inferior margin of the T8 vertebral body.   Electronically Signed   By: Aletta Edouard M.D.   On: 04/13/2014 13:49   Ct Chest W Contrast  04/15/2014   CLINICAL DATA:  Stage IV carcinoma of right breast. Mastectomy. Radiation therapy chemotherapy. Decompression of thoracic spine and fusion from T6-T10 yesterday. Secondary to epidural tumor.  EXAM: CT CHEST WITH CONTRAST  TECHNIQUE: Multidetector CT imaging of the chest was performed during intravenous contrast administration.  CONTRAST:  64m OMNIPAQUE IOHEXOL 300 MG/ML  SOLN  COMPARISON:  Thoracic spine MR 04/12/2014.  Chest CT 06/08/2014.  FINDINGS: Mediastinum/Nodes: No supraclavicular adenopathy. Right mastectomy and axillary node dissection. No axillary adenopathy.  Normal heart size, without pericardial effusion. No mediastinal or hilar adenopathy. No internal mammary adenopathy.  Paravertebral soft tissue fullness on image 32 of series 201 at the  site of pathologic fracture and epidural tumor.  Lungs/Pleura: Small  bilateral pleural effusions. Right apical pleural parenchymal scarring which could be radiation induced.  3 mm right upper lobe pulmonary nodule on image 25 is new.  2 mm right lower lobe pulmonary nodule on image 29 is not identified on the prior exam.  Focus of ground-glass opacity in the left apex on image 15 is nonspecific.  Upper abdomen: 7 mm hypo attenuating right liver lobe lesion on image 53 was present on the prior exam and favored to represent a cyst. Too small to characterize lesions more inferiorly in the right lobe are unchanged. 2 too small to characterize lateral segment left liver lobe lesions which are not readily apparent on the prior exam.  Normal imaged portions of the spleen, stomach, adrenal glands, kidneys, pancreas. Cholecystectomy. Common duct is within normal limits, 9 mm.  Musculoskeletal: Postoperative edema and gas within the perivertebral space. Spine fixation at T6 through T10. The left-sided screws extend anterior to the vertebral bodies. Metastatic disease is identified throughout the thoracic spine, including in the right-sided T12 on sagittal images 63 and 64.  IMPRESSION: 1. Tiny pulmonary nodules, likely new since the prior exam. Cannot exclude early pulmonary metastasis. Recommend attention on follow-up. 2. Too small to characterize liver lesions. Some are new. Early metastatic disease cannot be excluded. If further imaging characterization is desired, pre and post contrast abdominal MRI may be informative. 3. Osseous metastasis throughout the thoracic spine with T8 compression fracture and interval fixation at T6-10. 4. Small bilateral pleural effusions. 5. Nonspecific ground-glass opacity at the left apex. Correlate with infectious symptoms. Recommend attention on follow-up.   Electronically Signed   By: Abigail Miyamoto M.D.   On: 04/15/2014 10:29   Mr Jeri Cos SA Contrast  04/21/2014   CLINICAL DATA:   Metastatic breast cancer  EXAM: MRI HEAD WITHOUT AND WITH CONTRAST  TECHNIQUE: Multiplanar, multiecho pulse sequences of the brain and surrounding structures were obtained without and with intravenous contrast.  CONTRAST:  5m MULTIHANCE GADOBENATE DIMEGLUMINE 529 MG/ML IV SOLN  COMPARISON:  MRI head 04/14/2014  FINDINGS: 11 mm enhancing mass in the left posterior centrum semiovale shows interval enlargement since the prior study when it measured approximately 7 mm. Moderate surrounding vasogenic edema is similar. Mild hemorrhage is more prominent on the current study due to higher field strength 3 Tesla imaging. No other intra-axial enhancing mass lesions identified in the brain  Enhancing mass lesion in the left medial mastoid portion of the temporal bone measures approximately 15 x 9 mm. This is unusual in location for metastatic disease but may represent bony metastatic disease. This is unchanged from the prior study.  Patchy signal abnormality in the upper cervical spine could represent metastatic disease. Heterogeneous signal in the clivus and calvarium could represent metastatic disease however it does not show significant focal enhancement suggesting tumor.  IMPRESSION: 11 mm metastatic deposit left posterior centrum semiovale shows interval enlargement from the prior study. This is a hemorrhagic lesion with surrounding white matter edema  Enhancing mass in the left mastoid segment of the temporal bone, worrisome for bony metastatic disease. Possible other bony metastatic disease including the cervical spine and calvarium.   Electronically Signed   By: CFranchot GalloM.D.   On: 04/21/2014 15:15   Mr BJeri CosWYTContrast  04/14/2014   CLINICAL DATA:  Metastatic breast cancer. Leg weakness from pathologic compression deformity and epidural tumor, with recent stabilization. Incidental brain edema discovered on spinal MR screening.  EXAM: MRI HEAD WITHOUT AND WITH CONTRAST  TECHNIQUE:  Multiplanar, multiecho  pulse sequences of the brain and surrounding structures were obtained without and with intravenous contrast.  CONTRAST:  MultiHance 12 mL.  COMPARISON:  Survey images from MRI thoracic spine 04/12/2014.  FINDINGS: There is no acute stroke or hemorrhage. No hydrocephalus or extra-axial fluid.  There is a 7 mm LEFT centrum semiovale brain metastasis, having reduced signal on T2 weighted images, no restriction of diffusion, and displaying mild susceptibility on gradient sequence. Avid postcontrast enhancement. No other definite metastases.  In the LEFT medial mastoid portion of the temporal bone, there is an asymmetric T2 hyperintense lesion 9 x 14 mm cross-section which shows mild postcontrast enhancement. This would be an unusual location for metastatic disease but this is not excluded given the osseous disease elsewhere. Temporal bone inflammatory process is also possible. Recommend CT of the temporal bones with contrast for further evaluation.  Mild disease of the clivus is suspected in its superior aspect. This is questioned on precontrast T1 weighted images inferior and posterior to the sella. Heterogeneity of calvarial bone marrow suggest osseous diploic involvement is possible.  No sinus or orbital disease. Negative RIGHT temporal bone. Major dural venous sinuses are patent.  IMPRESSION: 7 mm apparently solitary intra-axial intracranial metastasis with marked surrounding edema and mild susceptibility suggesting hemorrhage or calcification.  Unusual LEFT medial mastoid temporal bone enhancing lesion 9 x 14 mm cross-section. Consider CT temporal bone with contrast for further evaluation.  Metastatic disease to the clivus and calvarium not excluded. See discussion above.   Electronically Signed   By: Rolla Flatten M.D.   On: 04/14/2014 11:54   Mr Thoracic Spine W Wo Contrast  04/12/2014   ADDENDUM REPORT: 04/12/2014 21:27  ADDENDUM: Subsequently noted on the whole body localizer 3 plane screening series #3 is  asymmetric edema in the LEFT frontoparietal lobe concerning for a brain metastasis. MRI brain without and with contrast is recommended for further evaluation. Findings discussed with ordering provider.   Electronically Signed   By: Rolla Flatten M.D.   On: 04/12/2014 21:27   04/12/2014   CLINICAL DATA:  History of breast cancer with radiation and chemotherapy in 2013. Lower extremity weakness and difficulty bearing weight with back pain beginning a few weeks ago. Initial encounter.  EXAM: MRI THORACIC AND LUMBAR SPINE WITHOUT AND WITH CONTRAST  TECHNIQUE: Multiplanar and multiecho pulse sequences of the thoracic and lumbar spine were obtained without and with intravenous contrast.  CONTRAST:  27m MULTIHANCE GADOBENATE DIMEGLUMINE 529 MG/ML IV SOLN  COMPARISON:  None.  FINDINGS: MR THORACIC SPINE FINDINGS  Numbering of the thoracic spine was accomplished by counting down from the odontoid. There was an initial problem with the surface coil which was replaced mid way through the exam.  Widespread osseous metastatic disease is present. There is a pathologic compression fracture at T8 with slight retropulsion. Significant tumor involvement of the T7 and T9 vertebrae as well. Loss of approximately 50% vertebral body height at T8 is noted. Posterior element involvement is prominent at T8. Epidural tumor, along with retropulsed pathologic bone, extends into the spinal canal at T8, greater on the RIGHT, with significant ventral mass effect on the cord. Canal AP diameter is severely narrowed approximately 5 mm. Slight abnormal cord signal suspected at the T8 level on STIR imaging. The exiting foraminal nerve roots at T7-8 and T8-9 bilaterally are significantly affected, worse on the RIGHT.  No other areas of epidural tumor, but foci of metastatic disease are seen elsewhere in the thoracic spine including T6-7  posterior elements and T12 vertebral body most notably.  MR LUMBAR SPINE FINDINGS  No pathologic compression  fracture, but widespread osseous involvement throughout the lumbar spine. The largest tumor deposit is seen in the L5 vertebral body anteriorly. Minimal superior endplate depression at L1. No epidural tumor. Small foci of sacral involvement. Normal conus and cauda equina nerve roots. No paravertebral masses.  IMPRESSION: Pathologic compression fracture T8. Retropulsed bone and epidural tumor result in cord compression at this level. Significant tumor involvement at T7 and T9 without retropulsion.  Widespread osseous disease throughout the thoracic and lumbar spine, but no significant epidural tumor elsewhere or pathologic compression.  Findings discussed with ordering provider by myself at the time of interpretation.  Electronically Signed: By: Rolla Flatten M.D. On: 04/12/2014 20:57   Mr Lumbar Spine W Wo Contrast (assess For Abscess, Cord Compression)  04/12/2014   ADDENDUM REPORT: 04/12/2014 21:27  ADDENDUM: Subsequently noted on the whole body localizer 3 plane screening series #3 is asymmetric edema in the LEFT frontoparietal lobe concerning for a brain metastasis. MRI brain without and with contrast is recommended for further evaluation. Findings discussed with ordering provider.   Electronically Signed   By: Rolla Flatten M.D.   On: 04/12/2014 21:27   04/12/2014   CLINICAL DATA:  History of breast cancer with radiation and chemotherapy in 2013. Lower extremity weakness and difficulty bearing weight with back pain beginning a few weeks ago. Initial encounter.  EXAM: MRI THORACIC AND LUMBAR SPINE WITHOUT AND WITH CONTRAST  TECHNIQUE: Multiplanar and multiecho pulse sequences of the thoracic and lumbar spine were obtained without and with intravenous contrast.  CONTRAST:  16m MULTIHANCE GADOBENATE DIMEGLUMINE 529 MG/ML IV SOLN  COMPARISON:  None.  FINDINGS: MR THORACIC SPINE FINDINGS  Numbering of the thoracic spine was accomplished by counting down from the odontoid. There was an initial problem with the  surface coil which was replaced mid way through the exam.  Widespread osseous metastatic disease is present. There is a pathologic compression fracture at T8 with slight retropulsion. Significant tumor involvement of the T7 and T9 vertebrae as well. Loss of approximately 50% vertebral body height at T8 is noted. Posterior element involvement is prominent at T8. Epidural tumor, along with retropulsed pathologic bone, extends into the spinal canal at T8, greater on the RIGHT, with significant ventral mass effect on the cord. Canal AP diameter is severely narrowed approximately 5 mm. Slight abnormal cord signal suspected at the T8 level on STIR imaging. The exiting foraminal nerve roots at T7-8 and T8-9 bilaterally are significantly affected, worse on the RIGHT.  No other areas of epidural tumor, but foci of metastatic disease are seen elsewhere in the thoracic spine including T6-7 posterior elements and T12 vertebral body most notably.  MR LUMBAR SPINE FINDINGS  No pathologic compression fracture, but widespread osseous involvement throughout the lumbar spine. The largest tumor deposit is seen in the L5 vertebral body anteriorly. Minimal superior endplate depression at L1. No epidural tumor. Small foci of sacral involvement. Normal conus and cauda equina nerve roots. No paravertebral masses.  IMPRESSION: Pathologic compression fracture T8. Retropulsed bone and epidural tumor result in cord compression at this level. Significant tumor involvement at T7 and T9 without retropulsion.  Widespread osseous disease throughout the thoracic and lumbar spine, but no significant epidural tumor elsewhere or pathologic compression.  Findings discussed with ordering provider by myself at the time of interpretation.  Electronically Signed: By: JRolla FlattenM.D. On: 04/12/2014 20:57   Nm Bone  Scan Whole Body  04/15/2014   CLINICAL DATA:  History of breast cancer, stage IV.  EXAM: NUCLEAR MEDICINE WHOLE BODY BONE SCAN  TECHNIQUE:  Whole body anterior and posterior images were obtained approximately 3 hours after intravenous injection of radiopharmaceutical.  RADIOPHARMACEUTICALS:  25 mCi Technetium-99 MDP  COMPARISON:  Chest CT 04/15/2014  FINDINGS: Soft tissue activity and urinary activity is present - no superscan. At the time of imaging, renal activity has largely washed out.  There is a mid thoracic spine photopenic area correlating with large T8 metastasis. Spinal metastases throughout the thoracic and lumbar spine that are known from recent MRI and CT are not clearly visible.  IMPRESSION: 1. Osseous metastatic disease known by CT and MRI is significantly underestimated by bone scan - likely due to predominantly lytic nature. 2. T8 photopenia related to large lytic metastasis and surgical defect.   Electronically Signed   By: Monte Fantasia M.D.   On: 04/15/2014 17:13   Dg C-arm 61-120 Min  04/13/2014   CLINICAL DATA:  T6 through T10 laminectomy and fusion.  EXAM: THORACIC SPINE - 2 VIEW; DG C-ARM 61-120 MIN  COMPARISON:  04/12/2014  FINDINGS: Posterolateral rod and pedicle screw fixation noted at T6-T7-T9-T10, with a cross leakage at the T8 vertebral level but no pedicle screw at the T8 vertebral level. T8 compression fracture observed.  IMPRESSION: 1. Posterolateral rod and pedicle screw fixation at D3-T7-S1-X79, without complicating feature or malpositioning identified. 2. Pathologic T8 compression fracture.   Electronically Signed   By: Van Clines M.D.   On: 04/13/2014 16:03   Ct Temporal Bones Wo/w Cm  05/04/2014   CLINICAL DATA:  44 year old female with metastatic breast cancer. Solitary left brain metastasis, and abnormal enhancement of the left temporal bone suspicious for metastatic disease. Planned radiation treatment. Subsequent encounter.  EXAM: CT TEMPORAL BONES WITHOUT AND WITH CONTRAST  TECHNIQUE: Axial and coronal plane CT imaging of the petrous temporal bones was performed with thin-collimation image  reconstruction before and following intravenous contrast administration. Multiplanar CT image reconstructions were also generated.  CONTRAST:  43m OMNIPAQUE IOHEXOL 300 MG/ML  SOLN  COMPARISON:  Brain MRI without and with contrast 04/21/2014.  FINDINGS: The area of abnormal marrow enhancement in the left temporal bone near the mastoid on MRI is identified on series 2, image 66 corresponding to a 16 x 13 x 14 mm area of abnormally decreased bone mineralization (AP by transverse by CC). No overt destruction of bone has occurred. The lesion is inseparable from the left otic capsule. Note proximity to the left superior semicircular canal on series 2, image 66 and series 603, image 60. The adjacent mastoid and petrous apex air cells remain pneumatized.  No dural thickening was evident on MRI. The adjacent left sigmoid sinus remains patent. The major vascular structures at the skullbase are patent.  Other left temporal bone structures including the EAC, tympanic membrane, left tympanic cavity, ossicles, left IAC, and cochlea are within normal limits.  There is decreased bone mineralization in the clivus (series 3, image 38) which corresponds to more heterogeneous loss of normal marrow signal on the recent MRI. No skullbase lesion which has overtly destroyed bone is identified.  The right temporal bone is within normal limits.  Visualized orbit soft tissues are within normal limits. Stable visualized posterior fossa brain parenchyma.  IMPRESSION: 1. Enhancing left temporal bone lesion recently demonstrated on MRI corresponds to an area of abnormally decreased bone mineralization encompassing 16 x 13 x 14 mm, inseparable from  the left otic capsule and with proximity to the superior semicircular canal. See series 2, image 66 and series 603, image 60). 2. Suspicion of additional less discrete bone metastases given the skull marrow heterogeneity on 04/21/2014. No overtly destructive bone lesion at the skullbase. 3. Otherwise  normal temporal bones.   Electronically Signed   By: Genevie Ann M.D.   On: 05/04/2014 09:42     ASSESSMENT: 44 y.o. 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   (3)Treated neoadjuvantly with cyclophosphamide and docetaxel x4 completed 08/29/2011.  (4) status post right modified radical mastectomy 10/03/2011 showing a residual pT1c pN1a (3/18 lymph nodes positive) invasive ductal carcinoma, grade 1,estrogen receptor 100% positive, progesterone receptor negative, with no HER-2 amplification ([id number])  (a) anterolateral mastectomy pending, to be followed by reconstruction  (5) adjuvant radiation to the right chest wall, right supraclavicular fossa and right scar completed 12/26/2011  (6) tamoxifen started January 2014-- discontinued April 2016 with evidence of metastatic disease  (7) status post total hysterectomy with bilateral salpingo-oophorectomy 12/23/2012 with benign pathology ([id number])  METASTATIC DISEASE 04/13/2014 (8) presented with T8 cord compression and underwent laminectomy 04/13/2014 with T8 decompression of spinal cord, posterior fixation from T6-T10 with pedicle screws and rods, posterior arthrodesis with allograft. Pathology confirms an estrogen receptor positive, progesterone receptor negative metastatic  adenocarcinoma.   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 ExacTrac Snap verification was performed for each couch angle.   (b) radiation to the T-spine completed 05/08/2014.  (11) to start fulvestrant 05/18/2014 and Palbociclib 06/01/2014  (12) to start zolendronate 05/18/2014  PLAN: WeCharleis making a good recovery from her spinal surgery and radiation. She is looking for to getting back to work in July. She has not yet started home physical therapy, but is very hopeful that that will further improve her functional status.  Today we spent approximately 50 minutes discussing her situation. She understands that stage IV breast cancer is not curable with our current knowledge base. It is treatable however. The goal of treatment is control. Generally we prefer to use agents that cause fewer side effects and yet have a good chance of controlling the tumor. There is no survival advantage in going with more aggressive agents in the absence of critical visceral disease.  She understands also that most chemotherapy agents do not cross the blood-brain barrier. Accordingly follow-up and treatment of any brain lesions is done mostly through radiation oncology.  As far as peripheral disease is concerned were going to start with fulvestrant and Palbociclib. Today we talked about  the possible toxicities, side effects and complications of these agents. The plan is to start fulvestrant May 9. I hope we will have obtained the Palbociclib for her eye her second fulvestrant dose which will be May 23. If so we will start checking her counts on a once a week basis  until we establish her Palbociclib dose.  Once she is on stable treatment, she will be coming here on a once a month basis for her fulvestrant, every 3 months for her zolendronate, and of course she will be on Palbociclib 21 days on and 7 days off. The plan is to obtain restaging studies in 3 or 4 months. They will serve as a new baseline.  Gratia  has a good understanding of the overall plan. She agrees with it. She knows the goal of treatment in her case is control. She will call with any problems that may develop before her next visit here.  Chauncey Cruel, MD   05/11/2014 2:33 PM

## 2014-05-11 NOTE — Telephone Encounter (Signed)
Gave avs & calendar for May. Sent message to schedule treatment. Left message with Manuela Schwartz in [place] to move appointment for Dr.Moody.

## 2014-05-12 ENCOUNTER — Telehealth: Payer: Self-pay

## 2014-05-12 NOTE — Telephone Encounter (Signed)
Let pt know ibrance had been approved and was ready at [place].  Copay $10.  Pt to pick up and start immediately.  Confirmed appt on the 9th.  Pt voiced understanding.

## 2014-05-18 ENCOUNTER — Encounter: Payer: Self-pay | Admitting: *Deleted

## 2014-05-18 ENCOUNTER — Ambulatory Visit (HOSPITAL_BASED_OUTPATIENT_CLINIC_OR_DEPARTMENT_OTHER): Payer: BC Managed Care – PPO

## 2014-05-18 ENCOUNTER — Ambulatory Visit: Payer: BC Managed Care – PPO

## 2014-05-18 ENCOUNTER — Other Ambulatory Visit: Payer: Self-pay | Admitting: Oncology

## 2014-05-18 ENCOUNTER — Other Ambulatory Visit (HOSPITAL_BASED_OUTPATIENT_CLINIC_OR_DEPARTMENT_OTHER): Payer: BC Managed Care – PPO

## 2014-05-18 VITALS — BP 103/65 | HR 96 | Temp 97.3°F | Resp 18

## 2014-05-18 DIAGNOSIS — C773 Secondary and unspecified malignant neoplasm of axilla and upper limb lymph nodes: Secondary | ICD-10-CM | POA: Diagnosis not present

## 2014-05-18 DIAGNOSIS — C50811 Malignant neoplasm of overlapping sites of right female breast: Secondary | ICD-10-CM | POA: Diagnosis not present

## 2014-05-18 DIAGNOSIS — C50919 Malignant neoplasm of unspecified site of unspecified female breast: Secondary | ICD-10-CM

## 2014-05-18 DIAGNOSIS — C50411 Malignant neoplasm of upper-outer quadrant of right female breast: Secondary | ICD-10-CM

## 2014-05-18 DIAGNOSIS — C7951 Secondary malignant neoplasm of bone: Secondary | ICD-10-CM | POA: Diagnosis not present

## 2014-05-18 DIAGNOSIS — C7931 Secondary malignant neoplasm of brain: Secondary | ICD-10-CM

## 2014-05-18 DIAGNOSIS — Z5111 Encounter for antineoplastic chemotherapy: Secondary | ICD-10-CM | POA: Diagnosis not present

## 2014-05-18 LAB — COMPREHENSIVE METABOLIC PANEL (CC13)
ALT: 38 U/L (ref 0–55)
AST: 17 U/L (ref 5–34)
Albumin: 3.8 g/dL (ref 3.5–5.0)
Alkaline Phosphatase: 126 U/L (ref 40–150)
Anion Gap: 12 mEq/L — ABNORMAL HIGH (ref 3–11)
BILIRUBIN TOTAL: 0.53 mg/dL (ref 0.20–1.20)
BUN: 15.3 mg/dL (ref 7.0–26.0)
CALCIUM: 9.8 mg/dL (ref 8.4–10.4)
CHLORIDE: 101 meq/L (ref 98–109)
CO2: 28 mEq/L (ref 22–29)
Creatinine: 0.8 mg/dL (ref 0.6–1.1)
EGFR: >90 mL/min/{1.73_m2} (ref >90)
Glucose: 91 mg/dl (ref 70–140)
Potassium: 3.9 mEq/L (ref 3.5–5.1)
Sodium: 141 mEq/L (ref 136–145)
Total Protein: 7.1 g/dL (ref 6.4–8.3)

## 2014-05-18 LAB — CBC WITH DIFFERENTIAL/PLATELET
BASO%: 0 % (ref 0.0–2.0)
Basophils Absolute: 0 10*3/uL (ref 0.0–0.1)
EOS ABS: 0 10*3/uL (ref 0.0–0.5)
EOS%: 0 % (ref 0.0–7.0)
HCT: 43.1 % (ref 34.8–46.6)
HGB: 14.3 g/dL (ref 11.6–15.9)
LYMPH#: 0.4 10*3/uL — AB (ref 0.9–3.3)
LYMPH%: 4.5 % — ABNORMAL LOW (ref 14.0–49.7)
MCH: 29.5 pg (ref 25.1–34.0)
MCHC: 33.2 g/dL (ref 31.5–36.0)
MCV: 88.9 fL (ref 79.5–101.0)
MONO#: 0.4 10*3/uL (ref 0.1–0.9)
MONO%: 4.5 % (ref 0.0–14.0)
NEUT%: 91 % — ABNORMAL HIGH (ref 38.4–76.8)
NEUTROS ABS: 7.4 10*3/uL — AB (ref 1.5–6.5)
Platelets: 133 10*3/uL — ABNORMAL LOW (ref 145–400)
RBC: 4.85 10*6/uL (ref 3.70–5.45)
RDW: 15.5 % — AB (ref 11.2–14.5)
WBC: 8.1 10*3/uL (ref 3.9–10.3)

## 2014-05-18 MED ORDER — FULVESTRANT 250 MG/5ML IM SOLN
500.0000 mg | INTRAMUSCULAR | Status: DC
  Administered 2014-05-18: 500 mg via INTRAMUSCULAR
  Filled 2014-05-18: qty 10

## 2014-05-18 MED ORDER — ZOLEDRONIC ACID 5 MG/100ML IV SOLN: 5.0000 mg | Freq: Once | INTRAVENOUS | Status: DC

## 2014-05-18 MED ORDER — ZOLEDRONIC ACID 4 MG/100ML IV SOLN
4.0000 mg | Freq: Once | INTRAVENOUS | Status: AC
  Administered 2014-05-18: 4 mg via INTRAVENOUS
  Filled 2014-05-18: qty 100

## 2014-05-18 MED ORDER — SODIUM CHLORIDE 0.9 % IV SOLN
Freq: Once | INTRAVENOUS | Status: AC
  Administered 2014-05-18: 10:00:00 via INTRAVENOUS

## 2014-05-18 NOTE — Patient Instructions (Signed)
Fulvestrant injection What is this medicine? FULVESTRANT (ful VES trant) blocks the effects of estrogen. It is used to treat breast cancer in women past the age of menopause. This medicine may be used for other purposes; ask your health care provider or pharmacist if you have questions. COMMON BRAND NAME(S): FASLODEX What should I tell my health care provider before I take this medicine? They need to know if you have any of these conditions: -bleeding problems -liver disease -low levels of platelets in the blood -an unusual or allergic reaction to fulvestrant, other medicines, foods, dyes, or preservatives -pregnant or trying to get pregnant -breast-feeding How should I use this medicine? This medicine is for injection into a muscle. It is usually given by a health care professional in a hospital or clinic setting. Talk to your pediatrician regarding the use of this medicine in children. Special care may be needed. Overdosage: If you think you have taken too much of this medicine contact a poison control center or emergency room at once. NOTE: This medicine is only for you. Do not share this medicine with others. What if I miss a dose? It is important not to miss your dose. Call your doctor or health care professional if you are unable to keep an appointment. What may interact with this medicine? -medicines that treat or prevent blood clots like warfarin, enoxaparin, and dalteparin This list may not describe all possible interactions. Give your health care provider a list of all the medicines, herbs, non-prescription drugs, or dietary supplements you use. Also tell them if you smoke, drink alcohol, or use illegal drugs. Some items may interact with your medicine. What should I watch for while using this medicine? Your condition will be monitored carefully while you are receiving this medicine. You will need important blood work done while you are taking this medicine. Do not become pregnant  while taking this medicine. Women should inform their doctor if they wish to become pregnant or think they might be pregnant. There is a potential for serious side effects to an unborn child. Talk to your health care professional or pharmacist for more information. What side effects may I notice from receiving this medicine? Side effects that you should report to your doctor or health care professional as soon as possible: -allergic reactions like skin rash, itching or hives, swelling of the face, lips, or tongue -feeling faint or lightheaded, falls -fever or flu-like symptoms -sore throat -vaginal bleeding Side effects that usually do not require medical attention (report to your doctor or health care professional if they continue or are bothersome): -aches, pains -constipation or diarrhea -headache -hot flashes -nausea, vomiting -pain at site where injected -stomach pain This list may not describe all possible side effects. Call your doctor for medical advice about side effects. You may report side effects to FDA at 1-800-FDA-1088. Where should I keep my medicine? This drug is given in a hospital or clinic and will not be stored at home. NOTE: This sheet is a summary. It may not cover all possible information. If you have questions about this medicine, talk to your doctor, pharmacist, or health care provider.  2015, Elsevier/Gold Standard. (2007-05-06 15:39:24) Zoledronic Acid injection (Hypercalcemia, Oncology) What is this medicine? ZOLEDRONIC ACID (ZOE le dron ik AS id) lowers the amount of calcium loss from bone. It is used to treat too much calcium in your blood from cancer. It is also used to prevent complications of cancer that has spread to the bone. This medicine may   be used for other purposes; ask your health care provider or pharmacist if you have questions. COMMON BRAND NAME(S): Zometa What should I tell my health care provider before I take this medicine? They need to know  if you have any of these conditions: -aspirin-sensitive asthma -cancer, especially if you are receiving medicines used to treat cancer -dental disease or wear dentures -infection -kidney disease -receiving corticosteroids like dexamethasone or prednisone -an unusual or allergic reaction to zoledronic acid, other medicines, foods, dyes, or preservatives -pregnant or trying to get pregnant -breast-feeding How should I use this medicine? This medicine is for infusion into a vein. It is given by a health care professional in a hospital or clinic setting. Talk to your pediatrician regarding the use of this medicine in children. Special care may be needed. Overdosage: If you think you have taken too much of this medicine contact a poison control center or emergency room at once. NOTE: This medicine is only for you. Do not share this medicine with others. What if I miss a dose? It is important not to miss your dose. Call your doctor or health care professional if you are unable to keep an appointment. What may interact with this medicine? -certain antibiotics given by injection -NSAIDs, medicines for pain and inflammation, like ibuprofen or naproxen -some diuretics like bumetanide, furosemide -teriparatide -thalidomide This list may not describe all possible interactions. Give your health care provider a list of all the medicines, herbs, non-prescription drugs, or dietary supplements you use. Also tell them if you smoke, drink alcohol, or use illegal drugs. Some items may interact with your medicine. What should I watch for while using this medicine? Visit your doctor or health care professional for regular checkups. It may be some time before you see the benefit from this medicine. Do not stop taking your medicine unless your doctor tells you to. Your doctor may order blood tests or other tests to see how you are doing. Women should inform their doctor if they wish to become pregnant or think  they might be pregnant. There is a potential for serious side effects to an unborn child. Talk to your health care professional or pharmacist for more information. You should make sure that you get enough calcium and vitamin D while you are taking this medicine. Discuss the foods you eat and the vitamins you take with your health care professional. Some people who take this medicine have severe bone, joint, and/or muscle pain. This medicine may also increase your risk for jaw problems or a broken thigh bone. Tell your doctor right away if you have severe pain in your jaw, bones, joints, or muscles. Tell your doctor if you have any pain that does not go away or that gets worse. Tell your dentist and dental surgeon that you are taking this medicine. You should not have major dental surgery while on this medicine. See your dentist to have a dental exam and fix any dental problems before starting this medicine. Take good care of your teeth while on this medicine. Make sure you see your dentist for regular follow-up appointments. What side effects may I notice from receiving this medicine? Side effects that you should report to your doctor or health care professional as soon as possible: -allergic reactions like skin rash, itching or hives, swelling of the face, lips, or tongue -anxiety, confusion, or depression -breathing problems -changes in vision -eye pain -feeling faint or lightheaded, falls -jaw pain, especially after dental work -mouth sores -muscle cramps,   stiffness, or weakness -trouble passing urine or change in the amount of urine Side effects that usually do not require medical attention (report to your doctor or health care professional if they continue or are bothersome): -bone, joint, or muscle pain -constipation -diarrhea -fever -hair loss -irritation at site where injected -loss of appetite -nausea, vomiting -stomach upset -trouble sleeping -trouble swallowing -weak or  tired This list may not describe all possible side effects. Call your doctor for medical advice about side effects. You may report side effects to FDA at 1-800-FDA-1088. Where should I keep my medicine? This drug is given in a hospital or clinic and will not be stored at home. NOTE: This sheet is a summary. It may not cover all possible information. If you have questions about this medicine, talk to your doctor, pharmacist, or health care provider.  2015, Elsevier/Gold Standard. (2012-06-06 13:03:13)  

## 2014-05-18 NOTE — Progress Notes (Signed)
Patient states she had no dental problems and she saw her dentist last in February. No extractions are planned.   Discussed with patient that she needs to be on calcium with Vit. D.  Gave her written information on this from pharmacist.

## 2014-05-18 NOTE — Progress Notes (Signed)
Faslodex injections given by infusion nurse

## 2014-05-18 NOTE — Progress Notes (Signed)
[name]  Clinical Social Work was referred by patient to review and complete healthcare advance directives.  Clinical Social Worker met with patient in the infusion room to review advance directives booklet.  The patient designated Carolyn [name] as their primary healthcare agent and Carolyn [name] as their secondary agent.  Patient also completed healthcare living will.    Clinical Social Worker notarized documents and made copies for patient/family. Clinical Social Worker will send documents to medical records to be scanned into patient's chart. Clinical Social Worker encouraged patient/family to contact with any additional questions or concerns.  [name], MSW, LCSW, OSW-C Clinical Social Worker [facility] [phone]

## 2014-05-26 ENCOUNTER — Ambulatory Visit: Payer: BC Managed Care – PPO | Attending: Physical Medicine & Rehabilitation

## 2014-05-26 DIAGNOSIS — C7951 Secondary malignant neoplasm of bone: Secondary | ICD-10-CM | POA: Insufficient documentation

## 2014-05-26 DIAGNOSIS — G959 Disease of spinal cord, unspecified: Secondary | ICD-10-CM | POA: Diagnosis not present

## 2014-05-26 DIAGNOSIS — R262 Difficulty in walking, not elsewhere classified: Secondary | ICD-10-CM | POA: Diagnosis not present

## 2014-05-26 DIAGNOSIS — R269 Unspecified abnormalities of gait and mobility: Secondary | ICD-10-CM

## 2014-05-26 DIAGNOSIS — R6889 Other general symptoms and signs: Secondary | ICD-10-CM | POA: Insufficient documentation

## 2014-05-26 DIAGNOSIS — R531 Weakness: Secondary | ICD-10-CM | POA: Insufficient documentation

## 2014-05-26 DIAGNOSIS — Z79899 Other long term (current) drug therapy: Secondary | ICD-10-CM | POA: Insufficient documentation

## 2014-05-26 DIAGNOSIS — C50919 Malignant neoplasm of unspecified site of unspecified female breast: Secondary | ICD-10-CM | POA: Insufficient documentation

## 2014-05-26 NOTE — Therapy (Signed)
[facility] [address] [place], [place], [address] Phone: [phone]   Fax:  [phone]  Physical Therapy Evaluation  Patient Details  Name: Carolyn [name] MRN: [id number] Date of Birth: [birth year] Referring Provider:  Meredith Staggers, MD  Encounter Date: 05/26/2014      PT End of Session - 05/26/14 1134    Visit Number 1   Date for PT Re-Evaluation 07/21/14   PT Start Time 1103   PT Stop Time 1136   PT Time Calculation (min) 33 min   Activity Tolerance Patient tolerated treatment well   Behavior During Therapy [facility] for tasks assessed/performed      Past Medical History  Diagnosis Date  . 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 Hx of radiation therapy 11/08/11 -12/26/11    right chest wall/supraclav fossa, right scar  . History of chemotherapy 06/27/11 -09/04/11    neoadjuvant    Past Surgical History  Procedure Laterality Date  . Wisdom tooth extraction    . Portacath placement  06/20/2011    Procedure: INSERTION PORT-A-CATH;  Surgeon: Haywood Lasso, MD;  Location: [place];  Service: General;  Laterality: N/A;  . Modified mastectomy  10/03/2011    Procedure: MODIFIED MASTECTOMY;  Surgeon: Haywood Lasso, MD;  Location: [place];  Service: General;  Laterality: Right;  . Port-a-cath removal  01/30/2012    Procedure: MINOR REMOVAL PORT-A-CATH;  Surgeon: Haywood Lasso, MD;  Location: [name];  Service: General;  Laterality: Left;  . Breast biopsy      Left  . Robotic assisted total hysterectomy with bilateral salpingo oopherectomy Bilateral 12/23/2012    Procedure: ROBOTIC ASSISTED TOTAL HYSTERECTOMY WITH BILATERAL SALPINGO OOPHORECTOMY;  Surgeon: Marvene Staff, MD;  Location: [place];  Service: Gynecology;  Laterality: Bilateral;  . Cholecystectomy N/A 03/01/2014    Procedure: LAPAROSCOPIC CHOLECYSTECTOMY;   Surgeon: Leighton Ruff, MD;  Location: [facility];  Service: General;  Laterality: N/A;  . Laminectomy N/A 04/13/2014    Procedure: THORACIC LAMINECTOMY WITH FIXATION THORACIC SIX-THORACIC TEN FUSION;  Surgeon: Kristeen Miss, MD;  Location: [place] [facility];  Service: Neurosurgery;  Laterality: N/A;    There were no vitals filed for this visit.  Visit Diagnosis:  Weakness generalized - Plan: PT plan of care cert/re-cert  Abnormality of gait - Plan: PT plan of care cert/re-cert  Activity intolerance - Plan: PT plan of care cert/re-cert  Difficulty walking - Plan: PT plan of care cert/re-cert      Subjective Assessment - 05/26/14 1104    Subjective Pt is a 44 y.o. female who presents to PT with weakness and endurance deficits s/p thoracic surgery for rod placement.  Pt has thoracic spine metastatic cancer and is currently receiving chemotherapy.     Pertinent History thoracic spine surgery for rod placement 04/12/14-Dr Elsner   Limitations Walking;Standing   How long can you stand comfortably? 20 minutes   How long can you walk comfortably? 20 minutes   Patient Stated Goals increase strength, walk without walker   Currently in Pain? 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  No   Is the patient reluctant to leave their home because of a fear of falling?  No   Home Environment   Living Enviornment Private residence   [place] to enter   Entrance Stairs-Number of Steps [place] One level   [place] - 2 wheels;Wheelchair - manual;Tub bench   Prior Function   Level of Independence Independent with basic ADLs   Vocation Full time employment   Vocation Requirements [employer]- not working now   Leisure walking for exercise   Cognition   Overall Cognitive Status Within Functional Limits for tasks assessed   Posture/Postural Control   Posture/Postural Control Postural limitations   ROM / Strength   AROM / PROM / Strength AROM;Strength   AROM   Overall AROM  Deficits   Overall AROM Comments Shoulder AROM limited by 25%-pt reports that this is baseline for her   Strength   Overall Strength Deficits   Overall Strength Comments Lt hip flexor 4/5, DF 4/5 all other Lt hip and knee 4+/5, Rt hip and knee 4+/5 throughout.  UE strength 4 to 4+/5 thrgughout   Palpation   Palpation Surgical incision T2-T10 healing well with good scar mobility.  Muscle atrophy in bilateral rhomboids.  No palpable tenderness today   Transfers   Transfers Sit to Stand   Transfers Yes   Sit to Stand 6: Modified independent (Device/Increase time)   Stand to Sit 6: Modified independent (Device/Increase time)   Stand Pivot Transfers 6: Modified independent (Device/Increase time)   Ambulation/Gait   Ambulation/Gait Yes   Ambulation/Gait Assistance 6: Modified independent (Device/Increase time)   Ambulation Distance (Feet) 100 Feet   Assistive device Rolling walker   Gait Pattern Step-through pattern;Decreased stride length;Decreased dorsiflexion - left;Left steppage   Ambulation Surface Level                           PT Education - 05/26/14 1126    Education provided Yes   Education Details HEP: seated marching, long arch quads and marching, seated horizonal abduction and 3 way raises   Person(s) Educated Patient   Methods Explanation;Demonstration;Handout   Comprehension  Verbalized understanding;Returned demonstration          PT Short Term Goals - 05/26/14 1138    PT SHORT TERM GOAL #1   Title be independent in initial HEP   Time 4   Period Weeks   Status New   PT SHORT TERM GOAL #2   Title improve strength and safety to allow to wean from walker to cane 50% of the time in the home   Time 4   Period Weeks   Status New   PT SHORT TERM GOAL #3   Title walk for 25 minutes with walker without significant fatigue   Time 4   Period Weeks   Status New           PT Long Term Goals - 05/26/14 1140    PT LONG TERM GOAL #1   Title be independent in advanced HEP  Time 8   Period Weeks   Status New   PT LONG TERM GOAL #2   Title wean from walker to use of cane at least 50% in the community   Time 8   Period Weeks   Status New   PT LONG TERM GOAL #3   Title demonstrate heel to toe gait pattern on the Lt with level surface gait at least 50% of the time   Time 8   Period Weeks   Status New   PT LONG TERM GOAL #4   Title improve endurance to walk for 25 minutes with use of cane in the community   Time 8   Period Weeks   Status New   PT LONG TERM GOAL #5   Title demonstrate 4+/5 Lt hip flexor and DF strength to improve safety with gait   Time 8   Period Weeks   Status New               Plan - 05/26/14 1135    Clinical Impression Statement Pt presents to PT with weakness, gait instability, and endurance deficits.  Pt has metastatic cancer in the thoracic spine and brain and had thoracic spine surgery for rod placement.  Pt is also undergoing chemotherapy.  Pt will benefit from PT to improve strength, endurance, improve independence with gait and weakn from device safely.    Pt will benefit from skilled therapeutic intervention in order to improve on the following deficits Abnormal gait;Difficulty walking;Decreased endurance;Decreased activity tolerance;Decreased strength;Decreased balance   PT Frequency 2x / week   PT Duration 8  weeks   PT Treatment/Interventions ADLs/Self Care Home Management;Therapeutic activities;Patient/family education;Therapeutic exercise;Manual techniques;Gait training;Cryotherapy;Neuromuscular re-education;Electrical Stimulation;Functional mobility training   PT Next Visit Plan UE/LE strength and endurance, gait, balance activity.  Practice gait with cane   Consulted and Agree with Plan of Care Patient         Problem List Patient Active Problem List   Diagnosis Date Noted  . Breast cancer metastasized to brain   . 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 Malnutrition of moderate degree 04/13/2014  . Myelopathy 04/12/2014  . Pathologic fracture of thoracic vertebrae 04/12/2014  . Biliary colic 00/34/9179  . Breast cancer of upper-outer quadrant of right female breast 09/24/2013  . S/P total hysterectomy and bilateral salpingo-oophorectomy 12/23/2012  . History of chemotherapy   . Hx of radiation therapy     Yuridiana Formanek, PT 05/26/2014, 11:45 AM  [facility] [address], [place], [place], [address] Phone: [phone]   Fax:  [phone]

## 2014-05-26 NOTE — Patient Instructions (Signed)
KNEE: Extension, Long Arc Quad (Weight)  Place weight around leg. Raise leg until knee is straight. Hold _5__ seconds. Use ___ lb weight. _10__ reps per set (each leg), 4-5__ sets per day, __7_ days per week  Copyright  VHI. All rights reserved.    Reverse Fly / Shoulder Retraction   Extend both arms in front of body at shoulder height, palms down, holding band. Move arms out to sides, squeeze shoulder blades together. Repeat _10__ times. Do _4-5__ sessions per day.   Copyright  VHI. All rights reserved.     Knee Raise   Lift knee and then lower it. Repeat with other knee. Repeat _10__ times each leg. Do _4-5___ sessions per day.  http://gt2.exer.us/445   Copyright  VHI. All rights reserved.  Toe Up   Gently rise up on toes and back on heels. Repeat _20___ times. Do 4-5____ sessions per day.  http://gt2.exer.us/455   Copyright  VHI. All rights reserved.  SHOULDER: Flexion Unilateral (Weight)   Start with arm at side. Raise both arms forward and up to shoulder high  Keep elbows straight.  Use   lb weight.10  reps per set, 2-3___ sets per day.  Copyright  VHI. All rights reserved.  SHOULDER: Abduction (Weight)   Raise arm out and up. Keep elbow straight. Do not shrug shoulders. Use  # lb weight. _10__ reps per set, 2-_3__ sets per day.  Copyright  VHI. All rights reserved.  SHOULDER: Scaption (Weight)   Place arm at 45 angle to body. Raise arm up to shoulder level - shoulder keeping elbow straight.  Use   lb weight. _10__ reps per set, _2-3__ sets per day.

## 2014-05-28 ENCOUNTER — Encounter: Payer: Self-pay | Admitting: Radiation Oncology

## 2014-06-01 ENCOUNTER — Ambulatory Visit: Payer: BC Managed Care – PPO

## 2014-06-01 ENCOUNTER — Encounter: Payer: Self-pay | Admitting: Physical Therapy

## 2014-06-01 ENCOUNTER — Ambulatory Visit: Payer: BC Managed Care – PPO | Admitting: Physical Therapy

## 2014-06-01 DIAGNOSIS — R262 Difficulty in walking, not elsewhere classified: Secondary | ICD-10-CM

## 2014-06-01 DIAGNOSIS — C7951 Secondary malignant neoplasm of bone: Secondary | ICD-10-CM | POA: Diagnosis not present

## 2014-06-01 DIAGNOSIS — R269 Unspecified abnormalities of gait and mobility: Secondary | ICD-10-CM

## 2014-06-01 DIAGNOSIS — R6889 Other general symptoms and signs: Secondary | ICD-10-CM

## 2014-06-01 DIAGNOSIS — R531 Weakness: Secondary | ICD-10-CM

## 2014-06-01 NOTE — Therapy (Signed)
[facility] [address] [place], [place], [address] Phone: [phone]   Fax:  [phone]  Physical Therapy Treatment  Patient Details  Name: Carolyn [name] MRN: [id number] Date of Birth: 1970/04/27 Referring Provider:  [name], MD  Encounter Date: 06/01/2014      PT End of Session - 06/01/14 1448    Visit Number 2   Date for PT Re-Evaluation 07/21/14   PT Start Time 1400   PT Stop Time 1444   PT Time Calculation (min) 44 min   Equipment Utilized During Treatment Back brace;Other (comment)  Pt is wearing back brace, practiced amb with SPC in clinic    Activity Tolerance Patient tolerated treatment well   Behavior During Therapy [facility] for tasks assessed/performed      Past Medical History  Diagnosis Date  . 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 Hx of radiation therapy 11/08/11 -12/26/11    right chest wall/supraclav fossa, right scar  . History of chemotherapy 06/27/11 -09/04/11    neoadjuvant  . S/P radiation therapy 04/29/14    SRS brain    Past Surgical History  Procedure Laterality Date  . Wisdom tooth extraction    . Portacath placement  06/20/2011    Procedure: INSERTION PORT-A-CATH;  Surgeon: Haywood Lasso, MD;  Location: [place];  Service: General;  Laterality: N/A;  . Modified mastectomy  10/03/2011    Procedure: MODIFIED MASTECTOMY;  Surgeon: Haywood Lasso, MD;  Location: [place];  Service: General;  Laterality: Right;  . Port-a-cath removal  01/30/2012    Procedure: MINOR REMOVAL PORT-A-CATH;  Surgeon: Haywood Lasso, MD;  Location: [place];  Service: General;  Laterality: Left;  . Breast biopsy      Left  . Robotic assisted total hysterectomy with bilateral salpingo oopherectomy Bilateral 12/23/2012    Procedure: ROBOTIC ASSISTED TOTAL HYSTERECTOMY WITH BILATERAL SALPINGO OOPHORECTOMY;  Surgeon:  Marvene Staff, MD;  Location: [place] [facility];  Service: Gynecology;  Laterality: Bilateral;  . Cholecystectomy N/A 03/01/2014    Procedure: LAPAROSCOPIC CHOLECYSTECTOMY;  Surgeon: Leighton Ruff, MD;  Location[facility];  Service: General;  Laterality: N/A;  . Laminectomy N/A 04/13/2014    Procedure: THORACIC LAMINECTOMY WITH FIXATION THORACIC SIX-THORACIC TEN FUSION;  Surgeon: Kristeen Miss, MD;  Location: [place] [facility];  Service: Neurosurgery;  Laterality: N/A;    There were no vitals filed for this visit.  Visit Diagnosis:  Weakness generalized  Abnormality of gait  Activity intolerance  Difficulty walking      Subjective Assessment - 06/01/14 1411    Subjective Pt is a 44 y. o. female she reports she is ready to wean away from rolling walker, she reports feeling slowly improvement with regaining strength and endurance   Pertinent History thoracic spine surgery for rod placement 04/12/14-Dr Elsner   Currently in Pain? No/denies   Multiple Pain Sites No                         [facility] Adult PT Treatment/Exercise - 06/01/14 0001    Exercises   Exercises Lumbar;Shoulder;Knee/Hip   Lumbar Exercises: Standing   Other Standing Lumbar Exercises Practiced walking with SPC 50" x 3  SPC on Rt hand to support L LE, pt with antaxic gait, recommended to continue using RW   Lumbar Exercises: Seated   Long Arc Quad on Chair Strengthening;Both;20 reps;10  reps;Weights  2# added   Other Seated Lumbar Exercises Ball squzzes 2x10 with ball   Shoulder Exercises: Standing   Retraction Strengthening;10 reps;Other (comment);Theraband  standing leaning agains wall & sittng yellow band 2 x10   Other Standing Exercises D2 in standing leaning against wall & sitting 2 x10 yellow t-band  and bil horizontal abd with yello t-band                PT Education - 06/01/14 1447    Education provided Yes   Education Details PNF strengthening with yellow t-band   Person(s) Educated Patient    Methods Demonstration;Explanation;Handout   Comprehension Verbalized understanding;Returned demonstration          PT Short Term Goals - 06/01/14 1502    PT SHORT TERM GOAL #1   Title be independent in initial HEP   Time 4   Period Weeks   Status On-going   PT SHORT TERM GOAL #2   Title improve strength and safety to allow to wean from walker to cane 50% of the time in the home   Time 4   Period Weeks   Status On-going   PT SHORT TERM GOAL #3   Title walk for 25 minutes with walker without significant fatigue   Time 4   Period Weeks   Status On-going           PT Long Term Goals - 05/26/14 1140    PT LONG TERM GOAL #1   Title be independent in advanced HEP   Time 8   Period Weeks   Status New   PT LONG TERM GOAL #2   Title wean from walker to use of cane at least 50% in the community   Time 8   Period Weeks   Status New   PT LONG TERM GOAL #3   Title demonstrate heel to toe gait pattern on the Lt with level surface gait at least 50% of the time   Time 8   Period Weeks   Status New   PT LONG TERM GOAL #4   Title improve endurance to walk for 25 minutes with use of cane in the community   Time 8   Period Weeks   Status New   PT LONG TERM GOAL #5   Title demonstrate 4+/5 Lt hip flexor and DF strength to improve safety with gait   Time 8   Period Weeks   Status New               Plan - 06/01/14 1450    Clinical Impression Statement Pt with severe weakness, as noticed with shaking with and after exercises. Pt has metastatic cancer in the thoracic spine and brain and had thoracic surgery for rod placement. Pt is undergoing chemotherapy                                                           .    Pt will benefit from skilled therapeutic intervention in order to improve on the following deficits Abnormal gait;Difficulty walking;Decreased endurance;Decreased activity tolerance;Decreased strength;Decreased balance   Rehab Potential Good   PT Frequency 2x /  week   PT Duration 8 weeks   PT Treatment/Interventions ADLs/Self Care Home Management;Therapeutic activities;Patient/family education;Therapeutic exercise;Manual techniques;Gait training;Cryotherapy;Neuromuscular re-education;Electrical Stimulation;Functional mobility training  PT Next Visit Plan UE/LE strength and endurance, gait, balance activity.  Practice gait with cane   Consulted and Agree with Plan of Care Patient        Problem List Patient Active Problem List   Diagnosis Date Noted  . Breast cancer metastasized to brain   . 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 Malnutrition of moderate degree 04/13/2014  . Myelopathy 04/12/2014  . Pathologic fracture of thoracic vertebrae 04/12/2014  . Biliary colic 71/16/5790  . Breast cancer of upper-outer quadrant of right female breast 09/24/2013  . S/P total hysterectomy and bilateral salpingo-oophorectomy 12/23/2012  . History of chemotherapy   . Hx of radiation therapy     NAUMANN-HOUEGNIFIO,Jensine Luz PTA 06/01/2014, 3:06 PM  [facility] [address], [place], [place], [address] Phone: [phone]   Fax:  [phone]

## 2014-06-01 NOTE — Patient Instructions (Signed)
  PNF Strengthening: Resisted   Standing with resistive band around each hand, bring right arm up and away, thumb back. Repeat _10___ times per set. Do _3___ sets per session. Do _2-3___ sessions per day.      Resisted Horizontal Abduction: Bilateral   Sit or stand, tubing in both hands, arms out in front. Keeping arms straight, pinch shoulder blades together and stretch arms out. Repeat _10___ times per set. Do 3____ sets per session. Do _2-3__ sessions per day.                  Scapular Retraction: Elbow Flexion  In sitting (Standing lean againts wall for support)   With elbows bent to 90, pinch shoulder blades together and rotate arms out, but keep your elbows at ribcage.  Keeping elbows bent. Repeat _10___ times per set. Do 2-3___ sets per session. Do many_3___ sessions per day.

## 2014-06-03 ENCOUNTER — Other Ambulatory Visit: Payer: Self-pay | Admitting: Radiation Therapy

## 2014-06-03 ENCOUNTER — Ambulatory Visit (HOSPITAL_BASED_OUTPATIENT_CLINIC_OR_DEPARTMENT_OTHER): Payer: BC Managed Care – PPO

## 2014-06-03 ENCOUNTER — Telehealth: Payer: Self-pay | Admitting: Nurse Practitioner

## 2014-06-03 ENCOUNTER — Ambulatory Visit
Admission: RE | Admit: 2014-06-03 | Discharge: 2014-06-03 | Disposition: A | Discharge status: Home / Self Care | Payer: BC Managed Care – PPO | Source: Ambulatory Visit | Attending: Radiation Oncology | Admitting: Radiation Oncology

## 2014-06-03 ENCOUNTER — Encounter: Payer: Self-pay | Admitting: Radiation Oncology

## 2014-06-03 ENCOUNTER — Ambulatory Visit (HOSPITAL_BASED_OUTPATIENT_CLINIC_OR_DEPARTMENT_OTHER): Payer: BC Managed Care – PPO | Admitting: Nurse Practitioner

## 2014-06-03 ENCOUNTER — Other Ambulatory Visit (HOSPITAL_BASED_OUTPATIENT_CLINIC_OR_DEPARTMENT_OTHER): Payer: BC Managed Care – PPO

## 2014-06-03 ENCOUNTER — Encounter: Payer: Self-pay | Admitting: Nurse Practitioner

## 2014-06-03 VITALS — BP 122/67 | HR 109 | Temp 99.1°F | Resp 18 | Ht 68.0 in | Wt 124.6 lb

## 2014-06-03 VITALS — BP 99/74 | HR 100 | Temp 98.8°F | Resp 20 | Wt 131.4 lb

## 2014-06-03 DIAGNOSIS — C50411 Malignant neoplasm of upper-outer quadrant of right female breast: Secondary | ICD-10-CM

## 2014-06-03 DIAGNOSIS — C7951 Secondary malignant neoplasm of bone: Secondary | ICD-10-CM

## 2014-06-03 DIAGNOSIS — C50919 Malignant neoplasm of unspecified site of unspecified female breast: Secondary | ICD-10-CM

## 2014-06-03 DIAGNOSIS — C50811 Malignant neoplasm of overlapping sites of right female breast: Secondary | ICD-10-CM

## 2014-06-03 DIAGNOSIS — Z5111 Encounter for antineoplastic chemotherapy: Secondary | ICD-10-CM

## 2014-06-03 DIAGNOSIS — K769 Liver disease, unspecified: Secondary | ICD-10-CM

## 2014-06-03 DIAGNOSIS — C7931 Secondary malignant neoplasm of brain: Secondary | ICD-10-CM | POA: Diagnosis not present

## 2014-06-03 DIAGNOSIS — C50911 Malignant neoplasm of unspecified site of right female breast: Secondary | ICD-10-CM

## 2014-06-03 DIAGNOSIS — C773 Secondary and unspecified malignant neoplasm of axilla and upper limb lymph nodes: Secondary | ICD-10-CM

## 2014-06-03 DIAGNOSIS — R911 Solitary pulmonary nodule: Secondary | ICD-10-CM

## 2014-06-03 HISTORY — DX: Personal history of irradiation: Z92.3

## 2014-06-03 LAB — COMPREHENSIVE METABOLIC PANEL (CC13)
ALBUMIN: 3.6 g/dL (ref 3.5–5.0)
ALK PHOS: 98 U/L (ref 40–150)
ALT: 38 U/L (ref 0–55)
AST: 21 U/L (ref 5–34)
Anion Gap: 12 mEq/L — ABNORMAL HIGH (ref 3–11)
BUN: 20.3 mg/dL (ref 7.0–26.0)
CALCIUM: 9.2 mg/dL (ref 8.4–10.4)
CHLORIDE: 105 meq/L (ref 98–109)
CO2: 27 mEq/L (ref 22–29)
CREATININE: 0.7 mg/dL (ref 0.6–1.1)
EGFR: >90 mL/min/{1.73_m2} (ref >90)
GLUCOSE: 84 mg/dL (ref 70–140)
Potassium: 3.8 mEq/L (ref 3.5–5.1)
Sodium: 144 mEq/L (ref 136–145)
TOTAL PROTEIN: 6.6 g/dL (ref 6.4–8.3)
Total Bilirubin: 0.27 mg/dL (ref 0.20–1.20)

## 2014-06-03 LAB — CBC WITH DIFFERENTIAL/PLATELET
BASO%: 0.2 % (ref 0.0–2.0)
Basophils Absolute: 0 10*3/uL (ref 0.0–0.1)
EOS ABS: 0 10*3/uL (ref 0.0–0.5)
EOS%: 0.2 % (ref 0.0–7.0)
HCT: 41.3 % (ref 34.8–46.6)
HGB: 13.7 g/dL (ref 11.6–15.9)
LYMPH#: 0.7 10*3/uL — AB (ref 0.9–3.3)
LYMPH%: 10.9 % — ABNORMAL LOW (ref 14.0–49.7)
MCH: 29.7 pg (ref 25.1–34.0)
MCHC: 33.2 g/dL (ref 31.5–36.0)
MCV: 89.4 fL (ref 79.5–101.0)
MONO#: 0.4 10*3/uL (ref 0.1–0.9)
MONO%: 6.5 % (ref 0.0–14.0)
NEUT%: 82.2 % — ABNORMAL HIGH (ref 38.4–76.8)
NEUTROS ABS: 5.2 10*3/uL (ref 1.5–6.5)
Platelets: 136 10*3/uL — ABNORMAL LOW (ref 145–400)
RBC: 4.62 10*6/uL (ref 3.70–5.45)
RDW: 15.5 % — AB (ref 11.2–14.5)
WBC: 6.3 10*3/uL (ref 3.9–10.3)

## 2014-06-03 MED ORDER — FULVESTRANT 250 MG/5ML IM SOLN
500.0000 mg | INTRAMUSCULAR | Status: DC
  Administered 2014-06-03: 500 mg via INTRAMUSCULAR
  Filled 2014-06-03: qty 10

## 2014-06-03 NOTE — Progress Notes (Signed)
  Radiation Oncology         [phone] ________________________________  Name: Carolyn [name] MRN: [id number]  Date: 06/03/2014  DOB: 1970-11-22  Follow-Up Visit Note  CC: [name], MD  [name], MD  Diagnosis:   Brain metastasis  Interval Since Last Radiation:  1 month   Narrative:  The patient returns today for routine follow-up.  Follow u[p s/p SRS brain, no c/o pain, no nuasea, no dizzy ness no vision changes, no headaches, appetite great on Decadron 4mg  oral daily Will complete this regimen 06/06/14, gets her brace off June 1st, had injection and lab this am in med onc                              ALLERGIES:  is allergic to allegra and shellfish allergy.  Meds: Current Outpatient Prescriptions  Medication Sig Dispense Refill  . Calcium Carbonate-Vit D-Min (CALCIUM 1200 PO) Take 1 tablet by mouth 2 (two) times daily.    . Cholecalciferol (VITAMIN D3) 2000 UNITS TABS Take 2,000 Units by mouth daily.    Marland Kitchen dexamethasone (DECADRON) 4 MG tablet Take 4 mg by mouth daily. Take with food. Pt will stop this med on May 28th per doctor's orders. 120 tablet 0  . iron polysaccharides (NIFEREX) 150 MG capsule Take 1 capsule (150 mg total) by mouth daily at 12 noon. 30 capsule 1  . Multiple Vitamin (MULTIVITAMIN) tablet Take 1 tablet by mouth daily.    Marland Kitchen senna-docusate (SENOKOT-S) 8.6-50 MG per tablet Take 2 tablets by mouth at bedtime. 60 tablet 1  . acetaminophen (TYLENOL) 325 MG tablet Take 2 tablets (650 mg total) by mouth every 6 (six) hours as needed for mild pain, moderate pain, fever or headache. (Patient not taking: Reported on 06/03/2014)    . palbociclib (IBRANCE) 125 MG capsule Take 1 capsule (125 mg total) by mouth daily with breakfast. Take whole with food. (Patient not taking: Reported on 06/03/2014) 21 capsule 6   No current facility-administered medications for this encounter.    Physical Findings: The patient is in no acute distress. Patient is alert and oriented.  weight is 131 lb 6.4 oz (59.603 kg). Her oral temperature is 98.8 F (37.1 C). Her blood pressure is 99/74 and her pulse is 100. Her respiration is 20 and oxygen saturation is 100%. .     Lab Findings: Lab Results  Component Value Date   WBC 6.3 06/03/2014   HGB 13.7 06/03/2014   HCT 41.3 06/03/2014   MCV 89.4 06/03/2014   PLT 136* 06/03/2014     Radiographic Findings: No results found.  Impression: Brain metastasis, continuing chemotherapy with medical oncology.   Plan:  Patient will be removing brace on June 1st. In order to follow the skull metastasis closely, discussed with patient about scheduling brain MRI scan sooner than normal. We will obtain a brain scan in one month.   [name], M.D., Ph.D.     This document serves as a record of services personally performed by Kyung Rudd, MD. It was created on his behalf by Derek Mound, a trained medical scribe. The creation of this record is based on the scribe's personal observations and the provider's statements to them. This document has been checked and approved by the attending provider.

## 2014-06-03 NOTE — Telephone Encounter (Signed)
Gave avs & calendar for [facility], August & September. Sent message to verify Treatment.

## 2014-06-03 NOTE — Progress Notes (Addendum)
Follow u[p s/p SRS brian, no c/o pain, no nuasea, no dizzy ness no vision changes, appetite great on Decadron 4mg  oral daily  Will complete this regimen 06/06/14, gets her brace off June 1st, had injection and lab this am in med onc 10:37 AM  Wt Readings from Last 3 Encounters:  06/03/14 124 lb 9.6 oz (56.518 kg)  05/11/14 125 lb 1.6 oz (56.745 kg)  05/01/14 126 lb (57.153 kg)  BP 99/74 mmHg  Pulse 100  Temp(Src) 98.8 F (37.1 C) (Oral)  Resp 20  Wt 131 lb 6.4 oz (59.603 kg)  SpO2 100%

## 2014-06-03 NOTE — Progress Notes (Signed)
Carolyn [name]  [phone] [phone]     ID: Carolyn [name] DOB: [birth year]  MR#: [id number]  WHQ#:[id number]  Patient Care Team: [name], MD as PCP - General (Family Medicine) Thea Silversmith, MD (Radiation Oncology) Neldon Mc, MD as Surgeon (General Surgery) Chauncey Cruel, MD as Consulting Physician (Oncology) OTHER M.D.JN Duncan: Estrogen receptor positive breast cancer  CURRENT TREATMENT: fulvestrant, palbociclib, zolendronate   BREAST CANCER HISTORY: From doctor Khan's of original intake node 05/31/2011:  "Carolyn [name] is a 44 y.o. female. Without significant past medical history. She underwent a mammogram that showed calcifications measuring 5 cm on the right breast. She then went on To have an ultrasound which showed the area to measure 2.9 cm. She was also found to have a right axillary lymph node that was suspicious for a malignancy. She had a biopsy of the calcifications that showed high-grade ductal carcinoma in situ. 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 In the lymph node biopsy there was no lymphatic tissue seen and was felt that the node was replaced by tumor. Patient went on to have an MRI of the bilateral breasts performed on evening of 05/30/2011. The MRI showed in the right breast 5 x 3 x 4.5 cm mass abutting the chest wall. About 7 cm away from this there was another mass in the upper inner quadrant that measured 1.5 x 1.7 x 1.5 cm. On the contralateral breast, that is the left breast a 2 cm area suspicious enhancement was noted also this up to date has not been biopsied and arrangements are being made for the biopsy to be performed. In this side there were no suspicious lymph nodes. The prognostic panel is pending. Patient is otherwise without any complaints. "  METASTATIC DISEASE: Carolyn [name] noted some strange feelings around her umbilicus 65/99/3570. This felt like  an area of numbness. Over the next 2 days she noted some leg weakness and difficulty walking, so she presented to the ED 04/12/2014. MRI of the thoraco-lumbar spine was obtained 04/12/2014 showing multiple bone lesions and compression fracture at T8 with retropulsion and cord compression. On 04/13/2014 she underwent Laminectomy T8 decompression of spinal cord posterior fixation from T6-T10 with pedicle screws and rods posterior arthrodesis with allograft. 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 HER-2 could not be obtained.  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 There were nonspecific L temporal bone changes and also possible involvement of the clivus and calvarium, but no other parenchymal brain lesions. Further staging studies included a bone scan, which failed to show the lytic lesion seen on other scans, and CTs of the chest, abdomen and pelvis on 06/08/2011, which showed very small right lung and left liver lesions which will require follow-up  Her subsequent history is as detailed below  INTERVAL HISTORY: Carolyn [name] returns today for follow up of her stage IV breast cancer. Since her last visit, she received her first zometa infusion and fulvestrant injection. She tolerated both drugs well with no complaints or incident. She is here today to discuss beginning palbociclib. This has already been sent to her pharmacy and is available for pickup.   REVIEW OF SYSTEMS: Carolyn [name] denies fevers, chills, nausea, or vomiting. She uses a combination of miralax  and sennakot for her constipation. She is urinating well. She has mild hot flashes. She denies pain today. She continues on physical therapy and is using her walker for pure balance now. She is ambulating well.  Her appetite is increased since she is on dexamethasone, but the taper has her coming off of this drug in 4 days. She denies headaches, dizziness, or vision changes. She has some tingling and numbness to the bottom of her feet. A detailed review of systems is otherwise stable.   PAST MEDICAL HISTORY: Past Medical History  Diagnosis Date  . 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 Hx of radiation therapy 11/08/11 -12/26/11    right chest wall/supraclav fossa, right scar  . History of chemotherapy 06/27/11 -09/04/11    neoadjuvant  . S/P radiation therapy 04/29/14    SRS brain    PAST SURGICAL HISTORY: Past Surgical History  Procedure Laterality Date  . Wisdom tooth extraction    . Portacath placement  06/20/2011    Procedure: INSERTION PORT-A-CATH;  Surgeon: Haywood Lasso, MD;  Location: [facility];  Service: General;  Laterality: N/A;  . Modified mastectomy  10/03/2011    Procedure: MODIFIED MASTECTOMY;  Surgeon: Haywood Lasso, MD;  Location: [facility];  Service: General;  Laterality: Right;  . Port-a-cath removal  01/30/2012    Procedure: MINOR REMOVAL PORT-A-CATH;  Surgeon: Haywood Lasso, MD;  Location: [facility];  Service: General;  Laterality: Left;  . Breast biopsy      Left  . Robotic assisted total hysterectomy with bilateral salpingo oopherectomy Bilateral 12/23/2012    Procedure: ROBOTIC ASSISTED TOTAL HYSTERECTOMY WITH BILATERAL SALPINGO OOPHORECTOMY;  Surgeon: Marvene Staff, MD;  Location: [facility];  Service: Gynecology;  Laterality: Bilateral;  . Cholecystectomy N/A 03/01/2014    Procedure: LAPAROSCOPIC CHOLECYSTECTOMY;  Surgeon: Leighton Ruff, MD;  Location: [facility];  Service: General;  Laterality: N/A;  . Laminectomy N/A 04/13/2014    Procedure: THORACIC LAMINECTOMY WITH FIXATION THORACIC SIX-THORACIC TEN FUSION;  Surgeon: Kristeen Miss, MD;  Location: [facility];  Service: Neurosurgery;   Laterality: N/A;    FAMILY HISTORY Family History  Problem Relation Age of Onset  . Breast cancer Maternal Aunt 14  . Pancreatic cancer Maternal Grandfather   . Pancreatic cancer Maternal Aunt     died in her 72s  . Leukemia Maternal Aunt     died in her 66s  . Ovarian cancer Cousin 51    maternal cousin   the patient's father is alive, currently 88 years old. The patient's mother died from complications of diabetes at the age of 29. The patient had no brothers, 4 sisters. One sister has died from congestive heart failure. The patient's mother is one of 5 sisters. One of them was diagnosed with breast cancer the age of 63. Also a cousin on the maternal side it was diagnosed with ovarian cancer. This was approximately age 50. There is also colon cancer in the family. The patient has had extensive genetic testing summarized below. She is however BRCA negative  GYNECOLOGIC HISTORY:  No LMP recorded. Patient is not currently having periods (Reason: Chemotherapy). Menarche age 31, she is GX P0. She underwent total hysterectomy with bilateral salpingo-oophorectomy December 2014.  SOCIAL HISTORY:  Carolyn [name] works at [employer] for [employer]. She mostly deals with government documents. She is single, lives by herself, with no pets. She attends [employer] locally.  ADVANCED DIRECTIVES: Not in place. The patient has a documents and intends to name her sister Griselda Bramblett as healthcare power of attorney. Joelene Millin lives in [place] and can be reached at [place]: History  Substance Use Topics  . Smoking status: Never Smoker   . Smokeless tobacco: Never Used  . Alcohol Use: No     Colonoscopy:  PAP:  Bone density:  Lipid panel:  Allergies  Allergen Reactions  . Allegra [Fexofenadine] Hives    Abdomen only  . Shellfish Allergy Hives    Abdomen only    Current Outpatient Prescriptions  Medication Sig Dispense Refill  . Calcium Carbonate-Vit D-Min (CALCIUM 1200  PO) Take 1 tablet by mouth 2 (two) times daily.    . Cholecalciferol (VITAMIN D3) 2000 UNITS TABS Take 2,000 Units by mouth daily.    Marland Kitchen dexamethasone (DECADRON) 4 MG tablet Take 4 mg by mouth daily. Take with food. Pt will stop this med on May 28th per doctor's orders. 120 tablet 0  . iron polysaccharides (NIFEREX) 150 MG capsule Take 1 capsule (150 mg total) by mouth daily at 12 noon. 30 capsule 1  . Multiple Vitamin (MULTIVITAMIN) tablet Take 1 tablet by mouth daily.    Marland Kitchen senna-docusate (SENOKOT-S) 8.6-50 MG per tablet Take 2 tablets by mouth at bedtime. 60 tablet 1  . acetaminophen (TYLENOL) 325 MG tablet Take 2 tablets (650 mg total) by mouth every 6 (six) hours as needed for mild pain, moderate pain, fever or headache. (Patient not taking: Reported on 06/03/2014)    . palbociclib (IBRANCE) 125 MG capsule Take 1 capsule (125 mg total) by mouth daily with breakfast. Take whole with food. (Patient not taking: Reported on 06/03/2014) 21 capsule 6   No current facility-administered medications for this visit.    OBJECTIVE: Middle-aged [place] American woman using a walker Filed Vitals:   06/03/14 0901  BP: 122/67  Pulse: 109  Temp: 99.1 F (37.3 C)  Resp: 18     Body mass index is 18.95 kg/(m^2).    ECOG FS:2 - Symptomatic, <50% confined to bed  Skin: warm, dry  HEENT: sclerae anicteric, conjunctivae pink, oropharynx clear. No thrush or mucositis.  Lymph Nodes: No cervical or supraclavicular lymphadenopathy  Lungs: clear to auscultation bilaterally, no rales, wheezes, or rhonci  Heart: regular rate and rhythm  Abdomen: limited because of chest/back brace Musculoskeletal: No focal spinal tenderness, no peripheral edema  Neuro: non focal, well oriented, positive affect  Breasts: deferred   LAB RESULTS:  CMP     Component Value Date/Time   NA 144 06/03/2014 0815   NA 141 04/21/2014 0910   K 3.8 06/03/2014 0815   K 3.6 04/21/2014 0910   CL 103 04/21/2014 0910   CL 102 01/16/2012  0804   CO2 27 06/03/2014 0815   CO2 27 04/21/2014 0910   GLUCOSE 84 06/03/2014 0815   GLUCOSE 133* 04/21/2014 0910   GLUCOSE 92 01/16/2012 0804   BUN 20.3 06/03/2014 0815   BUN 9 04/21/2014 0910   CREATININE 0.7 06/03/2014 0815   CREATININE 0.77 04/21/2014 0910   CALCIUM 9.2 06/03/2014 0815   CALCIUM 9.5 04/21/2014 0910   PROT 6.6 06/03/2014 0815   PROT 6.3 04/21/2014 0910   ALBUMIN 3.6 06/03/2014 0815   ALBUMIN 3.1* 04/21/2014 0910   AST 21 06/03/2014 0815   AST 26 04/21/2014 0910   ALT 38 06/03/2014 0815   ALT 19 04/21/2014 0910   ALKPHOS 98 06/03/2014 0815   ALKPHOS  99 04/21/2014 0910   BILITOT 0.27 06/03/2014 0815   BILITOT 0.5 04/21/2014 0910   GFRNONAA >90 04/21/2014 0910   GFRAA >90 04/21/2014 0910    I No results found for: SPEP  Lab Results  Component Value Date   WBC 6.3 06/03/2014   NEUTROABS 5.2 06/03/2014   HGB 13.7 06/03/2014   HCT 41.3 06/03/2014   MCV 89.4 06/03/2014   PLT 136* 06/03/2014      Chemistry      Component Value Date/Time   NA 144 06/03/2014 0815   NA 141 04/21/2014 0910   K 3.8 06/03/2014 0815   K 3.6 04/21/2014 0910   CL 103 04/21/2014 0910   CL 102 01/16/2012 0804   CO2 27 06/03/2014 0815   CO2 27 04/21/2014 0910   BUN 20.3 06/03/2014 0815   BUN 9 04/21/2014 0910   CREATININE 0.7 06/03/2014 0815   CREATININE 0.77 04/21/2014 0910      Component Value Date/Time   CALCIUM 9.2 06/03/2014 0815   CALCIUM 9.5 04/21/2014 0910   ALKPHOS 98 06/03/2014 0815   ALKPHOS 99 04/21/2014 0910   AST 21 06/03/2014 0815   AST 26 04/21/2014 0910   ALT 38 06/03/2014 0815   ALT 19 04/21/2014 0910   BILITOT 0.27 06/03/2014 0815   BILITOT 0.5 04/21/2014 0910       Lab Results  Component Value Date   LABCA2 24 05/31/2011    No components found for: ONGEX528  No results for input(s): INR in the last 168 hours.  Urinalysis No results found for: COLORURINE  STUDIES: No results found.   ASSESSMENT: 44 y.o. BRCA negative  Carolyn [name] woman status post right breast upper outer quadrant and right axillary lymph node biopsy 05/18/2011, both positive for an invasive ductal carcinoma, high-grade, clinicallyT2 N1-2 or stage IIB/IIIA,  estrogen receptor 100% positive, progesterone receptor 87% positive, with an MIB-1 of 14% and no HER-2 amplification ([id number]).  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   (a) VUS were also found in [place] and BARD1  (2) additional right breast biopsy upper inner quadrant 06/08/2011 showed only a fibroadenoma, and central left breast biopsy for another suspicious lesion showed only fibrocystic changes (SAA [id number] and [id number]).   (3)Treated neoadjuvantly with cyclophosphamide and docetaxel x4 completed 08/29/2011.  (4) status post right modified radical mastectomy 10/03/2011 showing a residual pT1c pN1a (3/18 lymph nodes positive) invasive ductal carcinoma, grade 1,estrogen receptor 100% positive, progesterone receptor negative, with no HER-2 amplification ([id number] [id number])  (a) anterolateral mastectomy pending, to be followed by reconstruction  (5) adjuvant radiation to the right chest wall, right supraclavicular fossa and right scar completed 12/26/2011  (6) tamoxifen started January 2014-- discontinued April 2016 with evidence of metastatic disease  (7) status post total hysterectomy with bilateral salpingo-oophorectomy 12/23/2012 with benign pathology ([id number])  METASTATIC DISEASE 04/13/2014 (8) presented with T8 cord compression and underwent laminectomy 04/13/2014 with T8 decompression of spinal cord, posterior fixation from T6-T10 with pedicle screws and rods, posterior arthrodesis with allograft. 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 ExacTrac Snap verification was performed for each couch angle.   (b) radiation to the T-spine completed 05/08/2014.  (11) to start fulvestrant 05/18/2014 and Palbociclib 06/01/2014  (12) to start zolendronate 05/18/2014  PLAN: Carolyn [name] is doing well today. The labs were reviewed in detail and were entirely stable. She want a ferritin level checked with her next lab appointment, and I am happy to oblige, though she was happy to hear her anemia has completely resolved.   Carolyn [name] and I reviewed her therapy plan. She will receive the booster fulvestrant shot, then she will move to monthly injections starting in early June. She will next receive zometa in August. As she has not started on the palbociclib yet, we reviewed it's mechanism and schedule. She will begin on 126m 21 days on and and 7 days off. She will have weekly labs starting out until we can figure out a does that she tolerates well. She knows that neutropenia will be the main concern. She will stop the cycle when she becomes neutropenic, and will not restart this drug until her AMiddleportis 1.5 or greater.  WKasenwill return in June a week earlier than planned because she is heading out of town on father's day. Thus I have scheduled her for follow up on 6/15. She understands and agrees with this plan. She knows the goal of treatment in her case is control. She has been encouraged to call with any issues that might arise before her next visit here.   HLaurie Panda NP   06/03/2014 10:41 AM

## 2014-06-04 ENCOUNTER — Telehealth: Payer: Self-pay | Admitting: Oncology

## 2014-06-04 ENCOUNTER — Ambulatory Visit: Payer: BC Managed Care – PPO

## 2014-06-04 DIAGNOSIS — C7951 Secondary malignant neoplasm of bone: Secondary | ICD-10-CM | POA: Diagnosis not present

## 2014-06-04 DIAGNOSIS — R262 Difficulty in walking, not elsewhere classified: Secondary | ICD-10-CM

## 2014-06-04 DIAGNOSIS — R6889 Other general symptoms and signs: Secondary | ICD-10-CM

## 2014-06-04 DIAGNOSIS — R531 Weakness: Secondary | ICD-10-CM

## 2014-06-04 NOTE — Telephone Encounter (Signed)
Returned Advertising account executive. Left message to confirmed appointment for June

## 2014-06-04 NOTE — Telephone Encounter (Signed)
called pt main # and advised on June appts

## 2014-06-04 NOTE — Therapy (Signed)
[facility] [address], [place], [place], [address] Phone: [phone]   Fax:  [phone]  Physical Therapy Treatment  Patient Details  Name: Carolyn [name] MRN: [id number] Date of Birth: 03/26/1970 Referring Provider:  [name], MD  Encounter Date: 06/04/2014      PT End of Session - 06/04/14 1014    Visit Number 3   Date for PT Re-Evaluation 07/21/14   PT Start Time 0931   PT Stop Time 1016   PT Time Calculation (min) 45 min   Activity Tolerance Patient tolerated treatment well   Behavior During Therapy [facility] for tasks assessed/performed      Past Medical History  Diagnosis Date  . 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 Hx of radiation therapy 11/08/11 -12/26/11    right chest wall/supraclav fossa, right scar  . History of chemotherapy 06/27/11 -09/04/11    neoadjuvant  . S/P radiation therapy 04/29/14    SRS brain    Past Surgical History  Procedure Laterality Date  . Wisdom tooth extraction    . Portacath placement  06/20/2011    Procedure: INSERTION PORT-A-CATH;  Surgeon: Haywood Lasso, MD;  Location: [facility];  Service: General;  Laterality: N/A;  . Modified mastectomy  10/03/2011    Procedure: MODIFIED MASTECTOMY;  Surgeon: Haywood Lasso, MD;  Location: [facility];  Service: General;  Laterality: Right;  . Port-a-cath removal  01/30/2012    Procedure: MINOR REMOVAL PORT-A-CATH;  Surgeon: Haywood Lasso, MD;  Location: [facility];  Service: General;  Laterality: Left;  . Breast biopsy      Left  . Robotic assisted total hysterectomy with bilateral salpingo oopherectomy Bilateral 12/23/2012    Procedure: ROBOTIC ASSISTED TOTAL HYSTERECTOMY WITH BILATERAL SALPINGO OOPHORECTOMY;  Surgeon: Marvene Staff, MD;  Location: [facility];  Service: Gynecology;  Laterality: Bilateral;  . Cholecystectomy N/A 03/01/2014   Procedure: LAPAROSCOPIC CHOLECYSTECTOMY;  Surgeon: Leighton Ruff, MD;  Location: [facility];  Service: General;  Laterality: N/A;  . Laminectomy N/A 04/13/2014    Procedure: THORACIC LAMINECTOMY WITH FIXATION THORACIC SIX-THORACIC TEN FUSION;  Surgeon: Kristeen Miss, MD;  Location: [facility];  Service: Neurosurgery;  Laterality: N/A;    There were no vitals filed for this visit.  Visit Diagnosis:  Weakness generalized  Activity intolerance  Difficulty walking      Subjective Assessment - 06/04/14 0944    Subjective Feeling pretty good since last time. Will be getting a cane for inside the house today.    How long can you walk comfortably? 15 minutes; able to walk hilly terrain to clinic today    Currently in Pain? No/denies                         [facility] Adult PT Treatment/Exercise - 06/04/14 0001    Knee/Hip Exercises: Aerobic   Stationary Bike Level 1 for 5 minutes    Knee/Hip Exercises: Standing   Other Standing Knee Exercises standing hip abduction; bil 2x10; UE support   rest break for 30 seconds in between   Other Standing Knee Exercises standing hip extension; 1x10 with UE supportx2   Knee/Hip Exercises: Seated   Long Arc Quad Strengthening;Both;Weights  2x10 reps; 2 lbs   Long Arc Quad Weight 2 lbs.   Other Seated Knee Exercises Hip adduction squeezes; 3x10 with 5 second squeeze  Shoulder Exercises: Standing   Retraction Strengthening;10 reps;Other (comment);Theraband  2 sets   Theraband Level (Shoulder Retraction) Level 1 (Yellow)   Other Standing Exercises D2 in standing with chair behind her; 2x10 with yellow theraband; rest break for 30 seconds in between                  PT Short Term Goals - 06/04/14 0957    PT SHORT TERM GOAL #3   Title walk for 25 minutes with walker without significant fatigue  able to walk 15 min to clinic today on hilly terrain   Time 4   Period Weeks   Status On-going           PT Long Term Goals -  05/26/14 1140    PT LONG TERM GOAL #1   Title be independent in advanced HEP   Time 8   Period Weeks   Status New   PT LONG TERM GOAL #2   Title wean from walker to use of cane at least 50% in the community   Time 8   Period Weeks   Status New   PT LONG TERM GOAL #3   Title demonstrate heel to toe gait pattern on the Lt with level surface gait at least 50% of the time   Time 8   Period Weeks   Status New   PT LONG TERM GOAL #4   Title improve endurance to walk for 25 minutes with use of cane in the community   Time 8   Period Weeks   Status New   PT LONG TERM GOAL #5   Title demonstrate 4+/5 Lt hip flexor and DF strength to improve safety with gait   Time 8   Period Weeks   Status New               Plan - 06/04/14 1005    Clinical Impression Statement Pt able to increase ambulation to 15 minutes with RW. Demonstrated increased endurance for exercise;  able to perform more exercises in standing and able to complete more sets of exercise with 30 second rest break in between. Pt will continue to benefit from skilled PT for endurance and LE strengthening.    Pt will benefit from skilled therapeutic intervention in order to improve on the following deficits Abnormal gait;Difficulty walking;Decreased endurance;Decreased activity tolerance;Decreased strength;Decreased balance   Rehab Potential Good   PT Frequency 2x / week   PT Duration 8 weeks   PT Treatment/Interventions ADLs/Self Care Home Management;Therapeutic activities;Patient/family education;Therapeutic exercise;Manual techniques;Gait training;Cryotherapy;Neuromuscular re-education;Electrical Stimulation;Functional mobility training   PT Next Visit Plan continue increase number of sets for exercise, try marching, start with warm up on bike for endurance, try arm bike if there is time?    Consulted and Agree with Plan of Care Patient        Problem List Patient Active Problem List   Diagnosis Date Noted  . Breast  cancer metastasized to brain   . 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 Malnutrition of moderate degree 04/13/2014  . Myelopathy 04/12/2014  . Pathologic fracture of thoracic vertebrae 04/12/2014  . Biliary colic 34/74/2595  . Breast cancer of upper-outer quadrant of right female breast 09/24/2013  . S/P total hysterectomy and bilateral salpingo-oophorectomy 12/23/2012  . History of chemotherapy   . Hx of radiation therapy     Reginal Lutes, SPT 06/04/2014 12:50 PM   [facility] [address], [place], [place], [address] Phone:  [phone]   Fax:  [phone]

## 2014-06-09 ENCOUNTER — Ambulatory Visit: Payer: BC Managed Care – PPO | Admitting: Physical Therapy

## 2014-06-09 ENCOUNTER — Telehealth: Payer: Self-pay | Admitting: *Deleted

## 2014-06-09 ENCOUNTER — Other Ambulatory Visit: Payer: Self-pay | Admitting: Oncology

## 2014-06-09 DIAGNOSIS — C7951 Secondary malignant neoplasm of bone: Secondary | ICD-10-CM | POA: Diagnosis not present

## 2014-06-09 DIAGNOSIS — R6889 Other general symptoms and signs: Secondary | ICD-10-CM

## 2014-06-09 DIAGNOSIS — R531 Weakness: Secondary | ICD-10-CM

## 2014-06-09 NOTE — Telephone Encounter (Signed)
VM message from pt @ 4:15 pm stating pt has a fever and a slight headache.  Call back to patient and she states she is feeling better, temperature is back to normal after taking 2 tylenol.  No headache but had slight upset stomach.  She is resting now. Instructed her to call back if fever or any other symptom returns.  Patient verbalized understanding.

## 2014-06-09 NOTE — Therapy (Signed)
[facility] [address], [place], [place], [address] Phone: [phone]   Fax:  [phone]  Physical Therapy Treatment  Patient Details  Name: Carolyn [name] MRN: [id number] Date of Birth: 30-Dec-1970 Referring Provider:  [name], MD  Encounter Date: 06/09/2014      PT End of Session - 06/09/14 1148    Visit Number 4   PT Start Time 8466   PT Stop Time 1228   PT Time Calculation (min) 42 min   Activity Tolerance Patient tolerated treatment well      Past Medical History  Diagnosis Date  . 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 Hx of radiation therapy 11/08/11 -12/26/11    right chest wall/supraclav fossa, right scar  . History of chemotherapy 06/27/11 -09/04/11    neoadjuvant  . S/P radiation therapy 04/29/14    SRS brain    Past Surgical History  Procedure Laterality Date  . Wisdom tooth extraction    . Portacath placement  06/20/2011    Procedure: INSERTION PORT-A-CATH;  Surgeon: Hay[place] Lasso, MD;  Location: [place];  Service: General;  Laterality: N/A;  . Modified mastectomy  10/03/2011    Procedure: MODIFIED MASTECTOMY;  Surgeon: Haywood Lasso, MD;  Location: [place];  Service: General;  Laterality: Right;  . Port-a-cath removal  01/30/2012    Procedure: MINOR REMOVAL PORT-A-CATH;  Surgeon: [place] Lasso, MD;  Location: [place];  Service: General;  Laterality: Left;  . Breast biopsy      Left  . Robotic assisted total hysterectomy with bilateral salpingo oopherectomy Bilateral 12/23/2012    Procedure: ROBOTIC ASSISTED TOTAL HYSTERECTOMY WITH BILATERAL SALPINGO OOPHORECTOMY;  Surgeon: Marvene Staff, MD;  Location: [facility];  Service: Gynecology;  Laterality: Bilateral;  . Cholecystectomy N/A 03/01/2014    Procedure: LAPAROSCOPIC CHOLECYSTECTOMY;  Surgeon: Leighton Ruff, MD;  Location: [facility];  Service:  General;  Laterality: N/A;  . Laminectomy N/A 04/13/2014    Procedure: THORACIC LAMINECTOMY WITH FIXATION THORACIC SIX-THORACIC TEN FUSION;  Surgeon: Kristeen Miss, MD;  Location: [facility];  Service: Neurosurgery;  Laterality: N/A;    There were no vitals filed for this visit.  Visit Diagnosis:  Weakness generalized  Activity intolerance      Subjective Assessment - 06/09/14 1149    Subjective Walked to therapy with no increased pain.    Currently in Pain? No/denies                         [facility] Adult PT Treatment/Exercise - 06/09/14 0001    Lumbar Exercises: Aerobic   UBE (Upper Arm Bike) L1 standing 2'/2'  VCs to engage abdominal muscles   Knee/Hip Exercises: Standing   Knee Flexion Strengthening;Both;2 sets;10 reps  2#   Other Standing Knee Exercises standing hip abduction; bil 2x10; UE support   rest break for 30 seconds in between   Other Standing Knee Exercises standing hip extension; 1x10 with UE supportx2  standing hip flexion 2 sets x 10 bil   Knee/Hip Exercises: Seated   Long Arc Quad Strengthening;Both;2 sets;10 reps  second set with 5 second hold   Long Arc Quad Weight 4 lbs.  3# on left; 4# on right    Other Seated Knee Exercises marching with 2# wts 20 ea bil  PT Short Term Goals - 06/04/14 0957    PT SHORT TERM GOAL #3   Title walk for 25 minutes with walker without significant fatigue  able to walk 15 min to clinic today on hilly terrain   Time 4   Period Weeks   Status On-going           PT Long Term Goals - 05/26/14 1140    PT LONG TERM GOAL #1   Title be independent in advanced HEP   Time 8   Period Weeks   Status New   PT LONG TERM GOAL #2   Title wean from walker to use of cane at least 50% in the community   Time 8   Period Weeks   Status New   PT LONG TERM GOAL #3   Title demonstrate heel to toe gait pattern on the Lt with level surface gait at least 50% of the time   Time 8   Period  Weeks   Status New   PT LONG TERM GOAL #4   Title improve endurance to walk for 25 minutes with use of cane in the community   Time 8   Period Weeks   Status New   PT LONG TERM GOAL #5   Title demonstrate 4+/5 Lt hip flexor and DF strength to improve safety with gait   Time 8   Period Weeks   Status New               Plan - 06/09/14 1334    Clinical Impression Statement Patient again walked to clinic with RW. She was able to increase weight with exercises and tolerated standing UBE for 4 minutes. She still requires some rests after standing exercises, but rests are shorter and less frequent.         Problem List Patient Active Problem List   Diagnosis Date Noted  . Breast cancer metastasized to brain   . 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 Malnutrition of moderate degree 04/13/2014  . Myelopathy 04/12/2014  . Pathologic fracture of thoracic vertebrae 04/12/2014  . Biliary colic 22/48/2500  . Breast cancer of upper-outer quadrant of right female breast 09/24/2013  . S/P total hysterectomy and bilateral salpingo-oophorectomy 12/23/2012  . History of chemotherapy   . Hx of radiation therapy    [name] Flavors PT  06/09/2014, 1:38 PM  [facility] [address], [place], [place], [address] Phone: [phone]   Fax:  [phone]

## 2014-06-10 ENCOUNTER — Other Ambulatory Visit (HOSPITAL_BASED_OUTPATIENT_CLINIC_OR_DEPARTMENT_OTHER): Payer: BC Managed Care – PPO

## 2014-06-10 DIAGNOSIS — C7951 Secondary malignant neoplasm of bone: Secondary | ICD-10-CM

## 2014-06-10 DIAGNOSIS — C50411 Malignant neoplasm of upper-outer quadrant of right female breast: Secondary | ICD-10-CM

## 2014-06-10 DIAGNOSIS — C50811 Malignant neoplasm of overlapping sites of right female breast: Secondary | ICD-10-CM

## 2014-06-10 LAB — CBC WITH DIFFERENTIAL/PLATELET
BASO%: 0.2 % (ref 0.0–2.0)
Basophils Absolute: 0 10*3/uL (ref 0.0–0.1)
EOS%: 0.1 % (ref 0.0–7.0)
Eosinophils Absolute: 0 10*3/uL (ref 0.0–0.5)
HCT: 38.8 % (ref 34.8–46.6)
HGB: 13.1 g/dL (ref 11.6–15.9)
LYMPH%: 3.1 % — ABNORMAL LOW (ref 14.0–49.7)
MCH: 30.1 pg (ref 25.1–34.0)
MCHC: 33.7 g/dL (ref 31.5–36.0)
MCV: 89.3 fL (ref 79.5–101.0)
MONO#: 0.1 10*3/uL (ref 0.1–0.9)
MONO%: 2.2 % (ref 0.0–14.0)
NEUT%: 94.4 % — ABNORMAL HIGH (ref 38.4–76.8)
NEUTROS ABS: 4.8 10*3/uL (ref 1.5–6.5)
Platelets: 159 10*3/uL (ref 145–400)
RBC: 4.34 10*6/uL (ref 3.70–5.45)
RDW: 17.1 % — ABNORMAL HIGH (ref 11.2–14.5)
WBC: 5 10*3/uL (ref 3.9–10.3)
lymph#: 0.2 10*3/uL — ABNORMAL LOW (ref 0.9–3.3)

## 2014-06-10 LAB — COMPREHENSIVE METABOLIC PANEL (CC13)
ALK PHOS: 84 U/L (ref 40–150)
ALT: 30 U/L (ref 0–55)
ANION GAP: 12 meq/L — AB (ref 3–11)
AST: 24 U/L (ref 5–34)
Albumin: 3.3 g/dL — ABNORMAL LOW (ref 3.5–5.0)
BILIRUBIN TOTAL: 0.65 mg/dL (ref 0.20–1.20)
BUN: 10.8 mg/dL (ref 7.0–26.0)
CHLORIDE: 101 meq/L (ref 98–109)
CO2: 30 meq/L — AB (ref 22–29)
Calcium: 8.8 mg/dL (ref 8.4–10.4)
Creatinine: 0.8 mg/dL (ref 0.6–1.1)
EGFR: >90 mL/min/{1.73_m2} (ref >90)
Glucose: 140 mg/dl (ref 70–140)
POTASSIUM: 3.6 meq/L (ref 3.5–5.1)
Sodium: 143 mEq/L (ref 136–145)
Total Protein: 6.6 g/dL (ref 6.4–8.3)

## 2014-06-10 LAB — FERRITIN CHCC: Ferritin: 170 ng/ml (ref 9–269)

## 2014-06-11 ENCOUNTER — Ambulatory Visit: Payer: BC Managed Care – PPO | Attending: Physical Medicine & Rehabilitation | Admitting: Physical Therapy

## 2014-06-11 DIAGNOSIS — R6889 Other general symptoms and signs: Secondary | ICD-10-CM | POA: Diagnosis present

## 2014-06-11 DIAGNOSIS — R262 Difficulty in walking, not elsewhere classified: Secondary | ICD-10-CM | POA: Insufficient documentation

## 2014-06-11 DIAGNOSIS — R269 Unspecified abnormalities of gait and mobility: Secondary | ICD-10-CM | POA: Insufficient documentation

## 2014-06-11 DIAGNOSIS — R531 Weakness: Secondary | ICD-10-CM | POA: Insufficient documentation

## 2014-06-11 NOTE — Patient Instructions (Addendum)
Lower abdominal/core stability exercises  1. Practice your breathing technique: Inhale through your nose expanding your belly and rib cage. Try not to breathe into your chest. Exhale slowly and gradually out your mouth feeling a sense of softness to your body. Practice multiple times. This can be performed unlimited.  2. Finding the lower abdominals. Laying on your back with the knees bent, place your fingers just below your belly button. Using your breathing technique from above, on your exhale gently pull the belly button away from your fingertips without tensing any other muscles. Practice this 5x. Next, as you exhale, draw belly button inwards and hold onto it...then feel as if you are pulling that muscle across your pelvis like you are tightening a belt. This can be hard to do at first so be patient and practice. Do 5-10 reps 1-3 x day. Always recognize quality over quantity; if your abdominal muscles become tired you will notice you may tighten/contract other muscles. This is the time to take a break.   Practice this first laying on your back, then in sitting, progressing to standing and finally adding it to all your daily movements.   3. Finding your pelvic floor. Using the breathing technique above, when your exhale, this time draw your pelvic floor muscles up as if you were attempting to stop the flow of urination. Be careful NOT to tense any other muscles. This can be hard, BE PATIENT. Try to hold up to 10 seconds repeating 10x. Try 2x a day. Once you feel you are doing this well, add this contraction to exercise #2. First contracting your pelvic floor followed by lower abdominals.   4. Adding leg movements. Add the following leg movements to challenge your ability to keep your core stable:  1. Single leg drop outs: Laying on your back with knees bent feet flat. Inhale,  dropping one knee outward KEEPING YOUR PELVIS STILL. Exhale as you bring the leg back, simultaneously performing your lower  abdominal contraction. Do 5-10 on each leg.   2. Marching: While keeping your pelvis still, lift the right foot a few inches, put it down then lift left foot. This will mimic a march. Start slow to establish control. Once you have control you may speed it up. Do 10-20x. You MUST keep your lower abdominlas contracted while you march. Breathe naturally.    3. Single leg slides: Inhale while you slowly slide one leg out keeping your pelvis still. Only slide your leg as far as you can keep your pelvis still. Exhale as you bring the leg back to the start, contracting the lower abdominals as you do that. Keep your upper body relaxed. Do 5-10 on each side.     Pelvic Press: On your stomach, press your hip bones toward the bed/floor without contracting your buttocks. Hold 5 seconds. Repeat 5x.  Then, do pelvic press and while holding bend one knee up 5x. Then the other knee.  1-2x/day  [name] Flavors, PT 06/11/2014 12:27 PM [facility] [address] [place], [place] [address] Phone # [phone] Fax [phone]

## 2014-06-11 NOTE — Therapy (Signed)
[facility] [address] [place], [place], [address] Phone: [phone]   Fax:  [phone]  Physical Therapy Treatment  Patient Details  Name: Carolyn [name] MRN: [id number] Date of Birth: 1970/09/12 Referring Provider:  [name], MD  Encounter Date: 06/11/2014      PT End of Session - 06/11/14 1144    Visit Number 5   Date for PT Re-Evaluation 07/21/14   PT Start Time 2202   PT Stop Time 1228   PT Time Calculation (min) 40 min   Activity Tolerance Patient tolerated treatment well      Past Medical History  Diagnosis Date  . 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 Hx of radiation therapy 11/08/11 -12/26/11    right chest wall/supraclav fossa, right scar  . History of chemotherapy 06/27/11 -09/04/11    neoadjuvant  . S/P radiation therapy 04/29/14    SRS brain    Past Surgical History  Procedure Laterality Date  . Wisdom tooth extraction    . Portacath placement  06/20/2011    Procedure: INSERTION PORT-A-CATH;  Surgeon: [place] Lasso, MD;  Location: [place];  Service: General;  Laterality: N/A;  . Modified mastectomy  10/03/2011    Procedure: MODIFIED MASTECTOMY;  Surgeon: [place] Lasso, MD;  Location: [place];  Service: General;  Laterality: Right;  . Port-a-cath removal  01/30/2012    Procedure: MINOR REMOVAL PORT-A-CATH;  Surgeon: [place] Lasso, MD;  Location: [place];  Service: General;  Laterality: Left;  . Breast biopsy      Left  . Robotic assisted total hysterectomy with bilateral salpingo oopherectomy Bilateral 12/23/2012    Procedure: ROBOTIC ASSISTED TOTAL HYSTERECTOMY WITH BILATERAL SALPINGO OOPHORECTOMY;  Surgeon: Marvene Staff, MD;  Location: [facility];  Service: Gynecology;  Laterality: Bilateral;  . Cholecystectomy N/A 03/01/2014    Procedure: LAPAROSCOPIC CHOLECYSTECTOMY;  Surgeon: [facility]ighton Ruff, MD;  [facility];  Service: General;  Laterality: N/A;  . Laminectomy N/A 04/13/2014    Procedure: THORACIC LAMINECTOMY WITH FIXATION THORACIC SIX-THORACIC TEN FUSION;  Surgeon: Kristeen Miss, MD;  Location: [facility] [facility];  Service: Neurosurgery;  Laterality: N/A;    There were no vitals filed for this visit.  Visit Diagnosis:  Weakness generalized  Difficulty walking  Activity intolerance      Subjective Assessment - 06/11/14 1142    Subjective Patient amb to therapy with SPC today. Took about 10-15 min. She no longer must wear her brace. No bending or twisitng   Currently in Pain? No/denies            [facility] PT Assessment - 06/11/14 0001    Assessment   Medical Diagnosis metastatic brease cancer to thoracic spine, myelopathy (G99.@)   Next MD Visit 07/23/14                     [facility] Adult PT Treatment/Exercise - 06/11/14 0001    Lumbar Exercises: Aerobic   UBE (Upper Arm Bike) L1 standing 2'/2'  VCs to engage abdominal muscles   Lumbar Exercises: Supine   Ab Set 5 reps;5 seconds  knee in/out; marching and heel slides x 5 ea.   AB Set Limitations weak eccentric hip add on left with knee out   Lumbar Exercises: Prone   Other Prone Lumbar Exercises Pelvic press 5 sec x 10; then with unilateral knee bends x 5 ea.  Knee/Hip Exercises: Seated   Long Arc Quad Strengthening;Both;2 sets;10 reps  second set with 5 second hold   Long Arc Quad Weight 4 lbs.  3# on left; 4# on right                 PT Education - 06/11/14 1331    Education provided Yes   Education Details TrAb and pelvic press   Person(s) Educated Patient   Methods Explanation;Demonstration;Handout   Comprehension Verbalized understanding;Returned demonstration          PT Short Term Goals - 06/11/14 1336    PT SHORT TERM GOAL #2   Title improve strength and safety to allow to wean from walker to cane 50% of the time in the home  able to walk to clinic today   Time 4    Period Weeks   Status On-going           PT Long Term Goals - 06/11/14 1627    PT LONG TERM GOAL #3   Title demonstrate heel to toe gait pattern on the Lt with level surface gait at least 50% of the time  demos normal heel/toe gait in clinic    Time 8   Period Weeks   Status On-going               Plan - 06/11/14 1332    Clinical Impression Statement Patient is progressing with ambulation goals. She walked to clinic with SPC in 10-15 min. She required 5 min rest upon arrival, but then tolerated therex without rest. Good contraction with TrAb and MF with core exercises but fatigues quickly with therex.    PT Next Visit Plan continue core strengthening, BLE strengthening and endurance activities. Balance.   Consulted and Agree with Plan of Care Patient        Problem List Patient Active Problem List   Diagnosis Date Noted  . Breast cancer metastasized to brain   . 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 Malnutrition of moderate degree 04/13/2014  . Myelopathy 04/12/2014  . Pathologic fracture of thoracic vertebrae 04/12/2014  . Biliary colic 33/38/3291  . Breast cancer of upper-outer quadrant of right female breast 09/24/2013  . S/P total hysterectomy and bilateral salpingo-oophorectomy 12/23/2012  . History of chemotherapy   . Hx of radiation therapy     [name] Flavors PT  06/11/2014, 4:30 PM  [facility] [address] [place], [place], [address] Phone: [phone]   Fax:  [phone]

## 2014-06-12 NOTE — Addendum Note (Signed)
Encounter addended by: Kyung Rudd, MD on: 06/12/2014  1:03 PM<BR>     Documentation filed: Notes Section

## 2014-06-15 ENCOUNTER — Ambulatory Visit: Payer: BC Managed Care – PPO

## 2014-06-16 ENCOUNTER — Other Ambulatory Visit: Payer: Self-pay | Admitting: Oncology

## 2014-06-16 ENCOUNTER — Encounter: Payer: Self-pay | Admitting: Physical Therapy

## 2014-06-16 ENCOUNTER — Ambulatory Visit: Payer: BC Managed Care – PPO | Admitting: Physical Therapy

## 2014-06-16 DIAGNOSIS — R6889 Other general symptoms and signs: Secondary | ICD-10-CM

## 2014-06-16 DIAGNOSIS — R269 Unspecified abnormalities of gait and mobility: Secondary | ICD-10-CM

## 2014-06-16 DIAGNOSIS — R531 Weakness: Secondary | ICD-10-CM

## 2014-06-16 DIAGNOSIS — R262 Difficulty in walking, not elsewhere classified: Secondary | ICD-10-CM

## 2014-06-16 NOTE — Therapy (Signed)
[facility] [address] [place], [place], [address] Phone: [phone]   Fax:  [phone]  Physical Therapy Treatment  Patient Details  Name: Carolyn [name] MRN: [id number] Date of Birth: 08/23/1970 Referring Provider:  [name], MD  Encounter Date: 06/16/2014      PT End of Session - 06/16/14 1039    Visit Number 6   Date for PT Re-Evaluation 07/21/14   PT Start Time 6629   PT Stop Time 1100   PT Time Calculation (min) 45 min   Activity Tolerance Patient tolerated treatment well   Behavior During Therapy [facility] for tasks assessed/performed      Past Medical History  Diagnosis Date  . 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 Hx of radiation therapy 11/08/11 -12/26/11    right chest wall/supraclav fossa, right scar  . History of chemotherapy 06/27/11 -09/04/11    neoadjuvant  . S/P radiation therapy 04/29/14    SRS brain    Past Surgical History  Procedure Laterality Date  . Wisdom tooth extraction    . Portacath placement  06/20/2011    Procedure: INSERTION PORT-A-CATH;  Surgeon: [place] Lasso, MD;  Location: [place];  Service: General;  Laterality: N/A;  . Modified mastectomy  10/03/2011    Procedure: MODIFIED MASTECTOMY;  Surgeon: [place] Lasso, MD;  Location: [place];  Service: General;  Laterality: Right;  . Port-a-cath removal  01/30/2012    Procedure: MINOR REMOVAL PORT-A-CATH;  Surgeon: [place] Lasso, MD;  Location: [place];  Service: General;  Laterality: Left;  . Breast biopsy      Left  . Robotic assisted total hysterectomy with bilateral salpingo oopherectomy Bilateral 12/23/2012    Procedure: ROBOTIC ASSISTED TOTAL HYSTERECTOMY WITH BILATERAL SALPINGO OOPHORECTOMY;  Surgeon: Marvene Staff, MD;  Location: [facility];  Service: Gynecology;  Laterality: Bilateral;  . Cholecystectomy N/A 03/01/2014   Procedure: LAPAROSCOPIC CHOLECYSTECTOMY;  Surgeon: [place] Ruff, MD;  [facility];  Service: General;  Laterality: N/A;  . Laminectomy N/A 04/13/2014    Procedure: THORACIC LAMINECTOMY WITH FIXATION THORACIC SIX-THORACIC TEN FUSION;  Surgeon: Kristeen Miss, MD;  Location: [place] [facility];  Service: Neurosurgery;  Laterality: N/A;    There were no vitals filed for this visit.  Visit Diagnosis:  Weakness generalized  Difficulty walking  Activity intolerance  Abnormality of gait      Subjective Assessment - 06/16/14 1021    Subjective Patient ambulated to PT with [facility] and reports she was able to walk the distance in 59min today   Pertinent History thoracic spine surgery for rod placement 04/12/14-Dr Elsner   How long can you stand comfortably? 20 minutes   How long can you walk comfortably? 15 minutes; able to walk hilly terrain to clinic today    Patient Stated Goals increase strength improve walking distance and pace with cane,   Currently in Pain? No/denies   Multiple Pain Sites No                         [facility] Adult PT Treatment/Exercise - 06/16/14 0001    Ambulation/Gait   Ambulation/Gait Yes  90 feet x2   Ambulation/Gait Assistance --  amb with SPC, pts cane is to high advised to buy adjustable    Lumbar Exercises: Aerobic   Stationary Bike Bike L 1 x 8 min   Lumbar  Exercises: Seated   Other Seated Lumbar Exercises Ball squzzes 2x10 with ball   Lumbar Exercises: Supine   Ab Set 5 reps;5 seconds   AB Set Limitations weak eccentric hip add on left with knee out   Lumbar Exercises: Prone   Other Prone Lumbar Exercises Pelvic press 5 sec x 10; unilateral & bil knee flex x 5,  5 sec hold .   Knee/Hip Exercises: Seated   Long Arc Quad Strengthening;Both;2 sets;10 reps   Long Arc Quad Weight 4 lbs.  3# on left; 4# on right                   PT Short Term Goals - 06/16/14 1043    PT SHORT TERM GOAL #1   Title be independent in initial HEP   Time  4   Period Weeks   Status Achieved   PT SHORT TERM GOAL #2   Title improve strength and safety to allow to wean from walker to cane 50% of the time in the home   Time 4   Period Weeks   Status Achieved   PT SHORT TERM GOAL #3   Title walk for 25 minutes with walker without significant fatigue  now able to ambul. with SPC up to 25 min when shopping   Time 4   Period Weeks   Status Achieved           PT Long Term Goals - 06/16/14 1046    PT LONG TERM GOAL #1   Title be independent in advanced HEP   Time 8   Period Weeks   Status On-going   PT LONG TERM GOAL #2   Title wean from walker to use of cane at least 50% in the community   Time 8   Period Weeks   Status Achieved   PT LONG TERM GOAL #3   Title demonstrate heel to toe gait pattern on the Lt with level surface gait at least 50% of the time   Time 8   Period Weeks   Status On-going   PT LONG TERM GOAL #4   Title improve endurance to walk for 25 minutes with use of cane in the community   Time 8   Period Weeks   Status Achieved   PT LONG TERM GOAL #5   Title demonstrate 4+/5 Lt hip flexor and DF strength to improve safety with gait   Time 8   Period Weeks   Status On-going               Plan - 06/16/14 1040    Clinical Impression Statement Pt is progressing in all areas esspecially with ambulatory goal, Pt tolerates exercises well and slowly gaining strength   Pt will benefit from skilled therapeutic intervention in order to improve on the following deficits Abnormal gait;Difficulty walking;Decreased endurance;Decreased activity tolerance;Decreased strength;Decreased balance   Rehab Potential Good   PT Frequency 2x / week   PT Duration 8 weeks   PT Treatment/Interventions ADLs/Self Care Home Management;Therapeutic activities;Patient/family education;Therapeutic exercise;Manual techniques;Gait training;Cryotherapy;Neuromuscular re-education;Electrical Stimulation;Functional mobility training   PT Next  Visit Plan continue core strengthening, BLE strengthening and endurance activities. Balance.   Consulted and Agree with Plan of Care Patient        Problem List Patient Active Problem List   Diagnosis Date Noted  . Breast cancer metastasized to brain   . 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 Malnutrition of moderate degree 04/13/2014  . Myelopathy 04/12/2014  .  Pathologic fracture of thoracic vertebrae 04/12/2014  . Biliary colic 82/64/1583  . Breast cancer of upper-outer quadrant of right female breast 09/24/2013  . S/P total hysterectomy and bilateral salpingo-oophorectomy 12/23/2012  . History of chemotherapy   . Hx of radiation therapy     NAUMANN-HOUEGNIFIO,Carolyn [name] PTA 06/16/2014, 11:12 AM  [facility] [address], [place], [place], [address] Phone: [phone]   Fax:  [phone]

## 2014-06-17 ENCOUNTER — Ambulatory Visit (HOSPITAL_COMMUNITY)
Admission: RE | Admit: 2014-06-17 | Discharge: 2014-06-17 | Disposition: A | Discharge status: Home / Self Care | Payer: BC Managed Care – PPO | Source: Ambulatory Visit | Attending: Oncology | Admitting: Oncology

## 2014-06-17 ENCOUNTER — Other Ambulatory Visit (HOSPITAL_BASED_OUTPATIENT_CLINIC_OR_DEPARTMENT_OTHER): Payer: BC Managed Care – PPO

## 2014-06-17 ENCOUNTER — Other Ambulatory Visit: Payer: Self-pay | Admitting: *Deleted

## 2014-06-17 ENCOUNTER — Other Ambulatory Visit: Payer: Self-pay | Admitting: Oncology

## 2014-06-17 ENCOUNTER — Encounter: Payer: Self-pay | Admitting: Nurse Practitioner

## 2014-06-17 ENCOUNTER — Ambulatory Visit (HOSPITAL_BASED_OUTPATIENT_CLINIC_OR_DEPARTMENT_OTHER): Payer: BC Managed Care – PPO

## 2014-06-17 ENCOUNTER — Ambulatory Visit (HOSPITAL_BASED_OUTPATIENT_CLINIC_OR_DEPARTMENT_OTHER): Payer: BC Managed Care – PPO | Admitting: Nurse Practitioner

## 2014-06-17 VITALS — BP 125/75 | HR 114 | Temp 99.0°F | Resp 16 | Wt 128.8 lb

## 2014-06-17 VITALS — BP 112/72 | HR 107 | Temp 102.5°F

## 2014-06-17 DIAGNOSIS — C50411 Malignant neoplasm of upper-outer quadrant of right female breast: Secondary | ICD-10-CM

## 2014-06-17 DIAGNOSIS — R509 Fever, unspecified: Secondary | ICD-10-CM

## 2014-06-17 DIAGNOSIS — C7931 Secondary malignant neoplasm of brain: Secondary | ICD-10-CM

## 2014-06-17 DIAGNOSIS — E876 Hypokalemia: Secondary | ICD-10-CM

## 2014-06-17 DIAGNOSIS — C7951 Secondary malignant neoplasm of bone: Secondary | ICD-10-CM

## 2014-06-17 DIAGNOSIS — Z5111 Encounter for antineoplastic chemotherapy: Secondary | ICD-10-CM

## 2014-06-17 DIAGNOSIS — C773 Secondary and unspecified malignant neoplasm of axilla and upper limb lymph nodes: Secondary | ICD-10-CM | POA: Diagnosis not present

## 2014-06-17 DIAGNOSIS — C50811 Malignant neoplasm of overlapping sites of right female breast: Secondary | ICD-10-CM

## 2014-06-17 DIAGNOSIS — T883XXA Malignant hyperthermia due to anesthesia, initial encounter: Secondary | ICD-10-CM

## 2014-06-17 DIAGNOSIS — R05 Cough: Secondary | ICD-10-CM | POA: Diagnosis not present

## 2014-06-17 DIAGNOSIS — C50919 Malignant neoplasm of unspecified site of unspecified female breast: Secondary | ICD-10-CM

## 2014-06-17 LAB — COMPREHENSIVE METABOLIC PANEL (CC13)
ALBUMIN: 3 g/dL — AB (ref 3.5–5.0)
ALK PHOS: 78 U/L (ref 40–150)
ALT: 13 U/L (ref 0–55)
AST: 15 U/L (ref 5–34)
Anion Gap: 9 mEq/L (ref 3–11)
BUN: 7.5 mg/dL (ref 7.0–26.0)
CO2: 29 meq/L (ref 22–29)
Calcium: 8.2 mg/dL — ABNORMAL LOW (ref 8.4–10.4)
Chloride: 99 mEq/L (ref 98–109)
Creatinine: 0.8 mg/dL (ref 0.6–1.1)
GLUCOSE: 119 mg/dL (ref 70–140)
Potassium: 3.4 mEq/L — ABNORMAL LOW (ref 3.5–5.1)
Sodium: 138 mEq/L (ref 136–145)
Total Bilirubin: 0.46 mg/dL (ref 0.20–1.20)
Total Protein: 7 g/dL (ref 6.4–8.3)

## 2014-06-17 LAB — CBC WITH DIFFERENTIAL/PLATELET
BASO%: 0.5 % (ref 0.0–2.0)
Basophils Absolute: 0 10*3/uL (ref 0.0–0.1)
EOS%: 0.4 % (ref 0.0–7.0)
Eosinophils Absolute: 0 10*3/uL (ref 0.0–0.5)
HCT: 32.4 % — ABNORMAL LOW (ref 34.8–46.6)
HGB: 11 g/dL — ABNORMAL LOW (ref 11.6–15.9)
LYMPH%: 5.6 % — ABNORMAL LOW (ref 14.0–49.7)
MCH: 29.8 pg (ref 25.1–34.0)
MCHC: 33.8 g/dL (ref 31.5–36.0)
MCV: 88.2 fL (ref 79.5–101.0)
MONO#: 0 10*3/uL — ABNORMAL LOW (ref 0.1–0.9)
MONO%: 2.2 % (ref 0.0–14.0)
NEUT#: 1.9 10*3/uL (ref 1.5–6.5)
NEUT%: 91.3 % — AB (ref 38.4–76.8)
PLATELETS: 103 10*3/uL — AB (ref 145–400)
RBC: 3.68 10*6/uL — AB (ref 3.70–5.45)
RDW: 16.7 % — AB (ref 11.2–14.5)
WBC: 2.1 10*3/uL — AB (ref 3.9–10.3)
lymph#: 0.1 10*3/uL — ABNORMAL LOW (ref 0.9–3.3)

## 2014-06-17 LAB — URINALYSIS, MICROSCOPIC - CHCC
Bilirubin (Urine): NEGATIVE
Blood: NEGATIVE
Glucose: NEGATIVE mg/dL
Ketones: NEGATIVE mg/dL
NITRITE: NEGATIVE
PH: 6 (ref 4.6–8.0)
Protein: NEGATIVE mg/dL
Specific Gravity, Urine: 1.01 (ref 1.003–1.035)
UROBILINOGEN UR: 0.2 mg/dL (ref 0.2–1)

## 2014-06-17 MED ORDER — CIPROFLOXACIN HCL 500 MG PO TABS: 500.0000 mg | ORAL_TABLET | Freq: Two times a day (BID) | ORAL | Status: DC

## 2014-06-17 MED ORDER — FULVESTRANT 250 MG/5ML IM SOLN
500.0000 mg | Freq: Once | INTRAMUSCULAR | Status: AC
  Administered 2014-06-17: 500 mg via INTRAMUSCULAR
  Filled 2014-06-17: qty 10

## 2014-06-17 NOTE — Assessment & Plan Note (Signed)
Patient presented to the [facility] today to receive her next Faslodex injection.  It was noted that time that her temperature was 102.0.  Patient's only complaint is some trace dysuria only.  She denies any frequency or flank pain.  She also denies any nausea, vomiting, diarrhea, or constipation.  Patient states that she took Tylenol; and temperature is currently 99.0.  Labs obtained today reveal a WBC of 2.1.  Urinalysis obtained today revealed nitrite negative, small amount of leukocytes, WBC 3-6, and few bacteria.  Pending both urine and blood culture results.  On exam patient with no abdominal or flank pain.  Patient appears nontoxic.  Will prescribe Cipro antibiotics for questionable, trace UTI.  Advised patient to call/return to go directly to the emergency department for any worsening symptoms whatsoever.

## 2014-06-17 NOTE — Progress Notes (Signed)
Carolyn [name] here for injection appointment.  Temperature 102.5 today.  No cough noted.  States that she has had hot flashes.  Temp at home has been low grade (99)   Notified Dr [name], instructed me to get her in with Symptom Management.  Orders put in computer for urine and blood cultures and chest x-ray by Dr [name].  Will give injection after being seen by SM.

## 2014-06-17 NOTE — Assessment & Plan Note (Signed)
Patient presented to the [facility] today to receive her next Faslodex injection.  Blood counts remained fairly stable.  Patient appears to be tolerating the Faslodex with minimal complaints.  Patient will proceed today with her Faslodex injection.  Patient will return on 06/24/2014 for labs and a follow-up visit.  She is scheduled for restaging brain MRI on 06/26/2014.  Her next Faslodex injection is due on 07/15/2014.

## 2014-06-17 NOTE — Assessment & Plan Note (Signed)
Potassium was 3.4 today.  Patient was encouraged to push potassium in her diet is much as possible.

## 2014-06-17 NOTE — Progress Notes (Signed)
SYMPTOM MANAGEMENT CLINIC   HPI: Carolyn [name] 44 y.o. 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  Currently undergoing Faslodex injections.  Patient presented to the [facility] today to receive her next Faslodex injection.  It was noted that time that her temperature was 102.0.  Patient's only complaint is some trace dysuria only.  She denies any frequency or flank pain.  She also denies any nausea, vomiting, diarrhea, or constipation.  Patient states that she took Tylenol; and temperature is currently 99.0.  Labs obtained today reveal a WBC of 2.1.  Urinalysis obtained today revealed nitrite negative, small amount of leukocytes, WBC 3-6, and few bacteria.  Pending both urine and blood culture results.  HPI  ROS  Past Medical History  Diagnosis Date  . 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 Hx of radiation therapy 11/08/11 -12/26/11    right chest wall/supraclav fossa, right scar  . History of chemotherapy 06/27/11 -09/04/11    neoadjuvant  . S/P radiation therapy 04/29/14    SRS brain    Past Surgical History  Procedure Laterality Date  . Wisdom tooth extraction    . Portacath placement  06/20/2011    Procedure: INSERTION PORT-A-CATH;  Surgeon: Haywood Lasso, MD;  Location: [place];  Service: General;  Laterality: N/A;  . Modified mastectomy  10/03/2011    Procedure: MODIFIED MASTECTOMY;  Surgeon: Haywood Lasso, MD;  Location: [place];  Service: General;  Laterality: Right;  . Port-a-cath removal  01/30/2012    Procedure: MINOR REMOVAL PORT-A-CATH;  Surgeon: Haywood Lasso, MD;  Location: [place];  Service: General;  Laterality: Left;  . Breast biopsy      Left  . Robotic assisted total hysterectomy with bilateral salpingo oopherectomy Bilateral 12/23/2012    Procedure: ROBOTIC ASSISTED TOTAL HYSTERECTOMY WITH BILATERAL SALPINGO OOPHORECTOMY;   Surgeon: Marvene Staff, MD;  Location: [facility];  Service: Gynecology;  Laterality: Bilateral;  . Cholecystectomy N/A 03/01/2014    Procedure: LAPAROSCOPIC CHOLECYSTECTOMY;  Surgeon: Leighton Ruff, MD;  Location: [facility];  Service: General;  Laterality: N/A;  . Laminectomy N/A 04/13/2014    Procedure: THORACIC LAMINECTOMY WITH FIXATION THORACIC SIX-THORACIC TEN FUSION;  Surgeon: Kristeen Miss, MD;  Location: [place];  Service: Neurosurgery;  Laterality: N/A;    has Hx of radiation therapy; History of chemotherapy; S/P total hysterectomy and bilateral salpingo-oophorectomy; Breast cancer of upper-outer quadrant of right female breast; Biliary colic; Myelopathy; Pathologic fracture of thoracic vertebrae; Malnutrition of moderate degree; Metastatic cancer to spine; Breast cancer metastasized to brain; Fever; and Hypokalemia on her problem list.    is allergic to allegra and shellfish allergy.    Medication List       This list is accurate as of: 06/17/14  4:39 PM.  Always use your most recent med list.               acetaminophen 325 MG tablet  Commonly known as:  TYLENOL  Take 2 tablets (650 mg total) by mouth every 6 (six) hours as needed for mild pain, moderate pain, fever or headache.     CALCIUM 1200 PO  Take 1 tablet by mouth 2 (two) times daily.     ciprofloxacin 500 MG tablet  Commonly known as:  CIPRO  Take 1 tablet (500 mg total) by mouth 2 (two) times daily.     dexamethasone 4  MG tablet  Commonly known as:  DECADRON  Take 4 mg by mouth daily. Take with food. Pt will stop this med on May 28th per doctor's orders.     iron polysaccharides 150 MG capsule  Commonly known as:  NIFEREX  Take 1 capsule (150 mg total) by mouth daily at 12 noon.     multivitamin tablet  Take 1 tablet by mouth daily.     palbociclib 125 MG capsule  Commonly known as:  IBRANCE  Take 1 capsule (125 mg total) by mouth daily with breakfast. Take whole with food.     senna-docusate 8.6-50  MG per tablet  Commonly known as:  Senokot-S  Take 2 tablets by mouth at bedtime.     Vitamin D3 2000 UNITS Tabs  Take 2,000 Units by mouth daily.         PHYSICAL EXAMINATION  Oncology Vitals 06/17/2014 06/17/2014 06/03/2014 06/03/2014 05/18/2014 05/11/2014 05/08/2014  Height - - - 173 cm - 173 cm -  Weight 58.423 kg - 59.603 kg 56.518 kg - 56.745 kg 57.879 kg  Weight (lbs) 128 lbs 13 oz - 131 lbs 6 oz 124 lbs 10 oz - 125 lbs 2 oz 127 lbs 10 oz  BMI (kg/m2) - - - 18.95 kg/m2 - 19.02 kg/m2 -  Temp 99 102.5 98.8 99.1 97.3 98.4 98.2  Pulse 114 107 100 109 96 98 95  Resp 16 - _0 Resp (Historical as of 08/10/11) - - - - - - -  SpO2 100 - 100 100 - - -  BSA (m2) - - - 1.65 m2 - 1.65 m2 -   BP Readings from Last 3 Encounters:  06/17/14 125/75  06/17/14 112/72  06/03/14 99/74    Physical Exam  Constitutional: She is oriented to person, place, and time. She appears unhealthy.  HENT:  Head: Normocephalic and atraumatic.  Mouth/Throat: Oropharynx is clear and moist.  Eyes: Conjunctivae and EOM are normal. Pupils are equal, round, and reactive to light. Right eye exhibits no discharge. Left eye exhibits no discharge. No scleral icterus.  Neck: Normal range of motion. Neck supple. No JVD present. No tracheal deviation present. No thyromegaly present.  Cardiovascular: Normal rate, regular rhythm, normal heart sounds and intact distal pulses.   Pulmonary/Chest: Breath sounds normal. No respiratory distress. She has no wheezes. She has no rales. She exhibits no tenderness.  Abdominal: Soft. Bowel sounds are normal. She exhibits no distension and no mass. There is no tenderness. There is no rebound and no guarding.  Musculoskeletal: Normal range of motion. She exhibits no edema or tenderness.  Lymphadenopathy:    She has no cervical adenopathy.  Neurological: She is alert and oriented to person, place, and time.  Walks with the assistance of a cane.  Skin: Skin is warm and dry. No rash  noted. No erythema. No pallor.  Psychiatric: Affect normal.  Nursing note and vitals reviewed.   LABORATORY DATA:. Appointment on 06/17/2014  Component Date Value Ref Range Status  . Glucose 06/17/2014 Negative  Negative mg/dL Final  . Bilirubin (Urine) 06/17/2014 Negative  Negative Final  . Ketones 06/17/2014 Negative  Negative mg/dL Final  . Specific Gravity, Urine 06/17/2014 1.010  1.003 - 1.035 Final  . Blood 06/17/2014 Negative  Negative Final  . pH 06/17/2014 6.0  4.6 - 8.0 Final  . Protein 06/17/2014 Negative  Negative- <30 mg/dL Final  . Urobilinogen, UR 06/17/2014 0.2  0.2 - 1 mg/dL Final  . Nitrite  06/17/2014 Negative  Negative Final  . Leukocyte Esterase 06/17/2014 Small  Negative Final  . RBC / HPF 06/17/2014 0-2  0 - 2 Final  . WBC, UA 06/17/2014 3-6  0 - 2 Final  . Bacteria, UA 06/17/2014 Few  Negative- Trace Final  . Epithelial Cells 06/17/2014 Occasional  Negative- Few Final  Appointment on 06/17/2014  Component Date Value Ref Range Status  . WBC 06/17/2014 2.1* 3.9 - 10.3 10e3/uL Final  . NEUT# 06/17/2014 1.9  1.5 - 6.5 10e3/uL Final  . HGB 06/17/2014 11.0* 11.6 - 15.9 g/dL Final  . HCT 06/17/2014 32.4* 34.8 - 46.6 % Final  . Platelets 06/17/2014 103* 145 - 400 10e3/uL Final  . MCV 06/17/2014 88.2  79.5 - 101.0 fL Final  . MCH 06/17/2014 29.8  25.1 - 34.0 pg Final  . MCHC 06/17/2014 33.8  31.5 - 36.0 g/dL Final  . RBC 06/17/2014 3.68* 3.70 - 5.45 10e6/uL Final  . RDW 06/17/2014 16.7* 11.2 - 14.5 % Final  . lymph# 06/17/2014 0.1* 0.9 - 3.3 10e3/uL Final  . MONO# 06/17/2014 0.0* 0.1 - 0.9 10e3/uL Final  . Eosinophils Absolute 06/17/2014 0.0  0.0 - 0.5 10e3/uL Final  . Basophils Absolute 06/17/2014 0.0  0.0 - 0.1 10e3/uL Final  . NEUT% 06/17/2014 91.3* 38.4 - 76.8 % Final  . LYMPH% 06/17/2014 5.6* 14.0 - 49.7 % Final  . MONO% 06/17/2014 2.2  0.0 - 14.0 % Final  . EOS% 06/17/2014 0.4  0.0 - 7.0 % Final  . BASO% 06/17/2014 0.5  0.0 - 2.0 % Final  . Sodium  06/17/2014 138  136 - 145 mEq/L Final  . Potassium 06/17/2014 3.4* 3.5 - 5.1 mEq/L Final  . Chloride 06/17/2014 99  98 - 109 mEq/L Final  . CO2 06/17/2014 29  22 - 29 mEq/L Final  . Glucose 06/17/2014 119  70 - 140 mg/dl Final  . BUN 06/17/2014 7.5  7.0 - 26.0 mg/dL Final  . Creatinine 06/17/2014 0.8  0.6 - 1.1 mg/dL Final  . Total Bilirubin 06/17/2014 0.46  0.20 - 1.20 mg/dL Final  . Alkaline Phosphatase 06/17/2014 78  40 - 150 U/L Final  . AST 06/17/2014 15  5 - 34 U/L Final  . ALT 06/17/2014 13  0 - 55 U/L Final  . Total Protein 06/17/2014 7.0  6.4 - 8.3 g/dL Final  . Albumin 06/17/2014 3.0* 3.5 - 5.0 g/dL Final  . Calcium 06/17/2014 8.2* 8.4 - 10.4 mg/dL Final  . Anion Gap 06/17/2014 9  3 - 11 mEq/L Final  . EGFR 06/17/2014 >90  >90 ml/min/1.73 m2 Final   eGFR is calculated using the CKD-EPI Creatinine Equation (2009)     RADIOGRAPHIC STUDIES: Dg Chest 2 View  06/17/2014   CLINICAL DATA:  Fever and cough.  EXAM: CHEST  2 VIEW  COMPARISON:  Chest CT 04/15/2014. Thoracic spine radiographs 05/21/2014 and 04/13/2014.  FINDINGS: The cardiomediastinal silhouette is within normal limits. Mild scarring is again seen in the right lung apex. Minimal nodularity in the left mid lung is similar to prior thoracic spine radiographs. No confluent airspace opacity, pulmonary edema, pleural effusion, or pneumothorax is identified. Prior right mastectomy in right axillary dissection are again identified. Mid thoracic vertebral compression fracture and adjacent posterior spinal fusion are again noted.  IMPRESSION: No evidence of acute airspace disease.   Electronically Signed   By: Logan Bores   On: 06/17/2014 10:44    ASSESSMENT/PLAN:    Breast cancer of upper-outer quadrant of right  female breast Patient presented to the [facility] today to receive her next Faslodex injection.  Blood counts remained fairly stable.  Patient appears to be tolerating the Faslodex with minimal complaints.  Patient will  proceed today with her Faslodex injection.  Patient will return on 06/24/2014 for labs and a follow-up visit.  She is scheduled for restaging brain MRI on 06/26/2014.  Her next Faslodex injection is due on 07/15/2014.  Fever Patient presented to the [facility] today to receive her next Faslodex injection.  It was noted that time that her temperature was 102.0.  Patient's only complaint is some trace dysuria only.  She denies any frequency or flank pain.  She also denies any nausea, vomiting, diarrhea, or constipation.  Patient states that she took Tylenol; and temperature is currently 99.0.  Labs obtained today reveal a WBC of 2.1.  Urinalysis obtained today revealed nitrite negative, small amount of leukocytes, WBC 3-6, and few bacteria.  Pending both urine and blood culture results.  On exam patient with no abdominal or flank pain.  Patient appears nontoxic.  Will prescribe Cipro antibiotics for questionable, trace UTI.  Advised patient to call/return to go directly to the emergency department for any worsening symptoms whatsoever.      Hypokalemia Potassium was 3.4 today.  Patient was encouraged to push potassium in her diet is much as possible.  Patient stated understanding of all instructions; and was in agreement with this plan of care. The patient knows to call the clinic with any problems, questions or concerns.   Review/collaboration with Dr. [name] regarding all aspects of patient's visit today.   Total time spent with patient was 25 minutes;  with greater than 75 percent of that time spent in face to face counseling regarding patient's symptoms,  and coordination of care and follow up.  Disclaimer: This note was dictated with voice recognition software. Similar sounding words can inadvertently be transcribed and may not be corrected upon review.   Drue Second, NP 06/17/2014

## 2014-06-18 ENCOUNTER — Telehealth: Payer: Self-pay

## 2014-06-18 ENCOUNTER — Encounter: Payer: BC Managed Care – PPO | Admitting: Physical Therapy

## 2014-06-18 LAB — URINE CULTURE

## 2014-06-18 NOTE — Telephone Encounter (Signed)
Called to follow up with pt after [facility] visit yesterday 06/17/14. Pt states she feels a whole lot better today. States she does still have a cough and inquired on what she could take for it. Per CB pt can take Robitussin and also Zyrtec since she mentioned it was most likely allergy related. Pt verbalized understanding and informed to call back with any further questions or concerns.

## 2014-06-19 ENCOUNTER — Encounter: Payer: Self-pay | Admitting: Oncology

## 2014-06-19 NOTE — Progress Notes (Signed)
I placed std form on desk of nurse for dr. [name].

## 2014-06-22 ENCOUNTER — Ambulatory Visit: Payer: BC Managed Care – PPO | Admitting: Physical Therapy

## 2014-06-22 ENCOUNTER — Encounter: Payer: Self-pay | Admitting: Physical Therapy

## 2014-06-22 ENCOUNTER — Encounter: Payer: Self-pay | Admitting: Radiation Oncology

## 2014-06-22 DIAGNOSIS — R531 Weakness: Secondary | ICD-10-CM | POA: Diagnosis not present

## 2014-06-22 DIAGNOSIS — R269 Unspecified abnormalities of gait and mobility: Secondary | ICD-10-CM

## 2014-06-22 DIAGNOSIS — R6889 Other general symptoms and signs: Secondary | ICD-10-CM

## 2014-06-22 DIAGNOSIS — R262 Difficulty in walking, not elsewhere classified: Secondary | ICD-10-CM

## 2014-06-22 NOTE — Therapy (Signed)
[facility] [address], [place], [place], [address] Phone: [phone]   Fax:  [phone]  Physical Therapy Treatment  Patient Details  Name: Carolyn [name] MRN: [id number] Date of Birth: 28-Dec-1970 Referring Provider:  [name], MD  Encounter Date: 06/22/2014      PT End of Session - 06/22/14 1056    Visit Number 7   Date for PT Re-Evaluation 07/21/14   PT Start Time 1014   PT Stop Time 1102   PT Time Calculation (min) 48 min   Activity Tolerance Patient tolerated treatment well   Behavior During Therapy [facility] for tasks assessed/performed      Past Medical History  Diagnosis Date  . 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 Hx of radiation therapy 11/08/11 -12/26/11    right chest wall/supraclav fossa, right scar  . History of chemotherapy 06/27/11 -09/04/11    neoadjuvant  . S/P radiation therapy 05/08/14    SRS brain    Past Surgical History  Procedure Laterality Date  . Wisdom tooth extraction    . Portacath placement  06/20/2011    Procedure: INSERTION PORT-A-CATH;  Surgeon: Haywood Lasso, MD;  Location: [place];  Service: General;  Laterality: N/A;  . Modified mastectomy  10/03/2011    Procedure: MODIFIED MASTECTOMY;  Surgeon: Haywood Lasso, MD;  Location: [place];  Service: General;  Laterality: Right;  . Port-a-cath removal  01/30/2012    Procedure: MINOR REMOVAL PORT-A-CATH;  Surgeon: Haywood Lasso, MD;  Location: [place];  Service: General;  Laterality: Left;  . Breast biopsy      Left  . Robotic assisted total hysterectomy with bilateral salpingo oopherectomy Bilateral 12/23/2012    Procedure: ROBOTIC ASSISTED TOTAL HYSTERECTOMY WITH BILATERAL SALPINGO OOPHORECTOMY;  Surgeon: Marvene Staff, MD;  Location: [facility];  Service: Gynecology;  Laterality: Bilateral;  . Cholecystectomy N/A 03/01/2014   Procedure: LAPAROSCOPIC CHOLECYSTECTOMY;  Surgeon: Leighton Ruff, MD;  Location: [facility];  Service: General;  Laterality: N/A;  . Laminectomy N/A 04/13/2014    Procedure: THORACIC LAMINECTOMY WITH FIXATION THORACIC SIX-THORACIC TEN FUSION;  Surgeon: Kristeen Miss, MD;  Location: [place] [facility];  Service: Neurosurgery;  Laterality: N/A;    There were no vitals filed for this visit.  Visit Diagnosis:  Weakness generalized  Difficulty walking  Activity intolerance  Abnormality of gait      Subjective Assessment - 06/22/14 1026    Subjective Pt reports has an UTI infections and reports feeling weaker. Pt was still able to walk with [facility] to clinic today   Pertinent History thoracic spine surgery for rod placement 04/12/14-Dr Elsner   Currently in Pain? No/denies   Multiple Pain Sites No                         [facility] Adult PT Treatment/Exercise - 06/22/14 0001    Ambulation/Gait   Ambulation/Gait Yes  amb with SPC stepping on 4"box, blue t-band pad, turns Lt Rt   Ambulation/Gait Assistance 6: Modified independent (Device/Increase time)   Lumbar Exercises: Aerobic   Stationary Bike Bike L 1 x 8 min   UBE (Upper Arm Bike) L1 sitting 3/3'  pt tolerated incr in time well   Lumbar Exercises: Standing   Other Standing Lumbar Exercises Bodyblade in standing 3 x 30 sec vertical & horizontal grip with focus on abdominal bracing  Other Standing Lumbar Exercises Red physioball x 10up the wall with focus on abdominal bracing    Lumbar Exercises: Seated   Other Seated Lumbar Exercises Ball squezzes 2x10 with orange ball   Knee/Hip Exercises: Standing   Lateral Step Up 10 reps;Hand Hold: 2;Step Height: 6"  pt with decreased control of movement in bil LE   Knee/Hip Exercises: Seated   Long Arc Quad Strengthening;Both;2 sets;10 reps   Long Arc Quad Weight 4 lbs.   Shoulder Exercises: Seated   Other Seated Exercises 1# weight into f90% flexion x 10                  PT  Short Term Goals - 06/22/14 1102    PT SHORT TERM GOAL #1   Title be independent in initial HEP   Time 4   Period Weeks   Status Achieved   PT SHORT TERM GOAL #2   Title improve strength and safety to allow to wean from walker to cane 50% of the time in the home   Time 4   Period Weeks   Status Achieved   PT SHORT TERM GOAL #3   Title walk for 25 minutes with walker without significant fatigue   Time 4   Period Weeks   Status Achieved           PT Long Term Goals - 06/22/14 1102    PT LONG TERM GOAL #1   Title be independent in advanced HEP   Time 8   Period Weeks   Status On-going   PT LONG TERM GOAL #2   Title wean from walker to use of cane at least 50% in the community   Time 8   Period Weeks   Status Achieved   PT LONG TERM GOAL #3   Title demonstrate heel to toe gait pattern on the Lt with level surface gait at least 50% of the time   Time 8   Period Weeks   Status On-going   PT LONG TERM GOAL #4   Title improve endurance to walk for 25 minutes with use of cane in the community   Time 8   Period Weeks   Status Achieved   PT LONG TERM GOAL #5   Title demonstrate 4+/5 Lt hip flexor and DF strength to improve safety with gait   Time 8   Period Weeks   Status On-going               Plan - 06/22/14 1100    Clinical Impression Statement Pt continues to progress toward all goals, able to increase activity with PT.    Pt will benefit from skilled therapeutic intervention in order to improve on the following deficits Abnormal gait;Difficulty walking;Decreased endurance;Decreased activity tolerance;Decreased strength;Decreased balance   Rehab Potential Good   PT Frequency 2x / week   PT Duration 8 weeks   PT Treatment/Interventions ADLs/Self Care Home Management;Therapeutic activities;Patient/family education;Therapeutic exercise;Manual techniques;Gait training;Cryotherapy;Neuromuscular re-education;Electrical Stimulation;Functional mobility training   PT  Next Visit Plan continue core strengthening, BLE strengthening and endurance activities. Balance.   Consulted and Agree with Plan of Care Patient        Problem List Patient Active Problem List   Diagnosis Date Noted  . Fever 06/17/2014  . Hypokalemia 06/17/2014  . Breast cancer metastasized to brain   . 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 Malnutrition of moderate degree 04/13/2014  . Myelopathy 04/12/2014  . Pathologic fracture of thoracic vertebrae 04/12/2014  .  Biliary colic 79/15/0413  . Breast cancer of upper-outer quadrant of right female breast 09/24/2013  . S/P total hysterectomy and bilateral salpingo-oophorectomy 12/23/2012  . History of chemotherapy   . Hx of radiation therapy     NAUMANN-HOUEGNIFIO,Rally Ouch PTA 06/22/2014, 1:29 PM  [facility] [address] [place], [place], [address] Phone: [phone]   Fax:  [phone]

## 2014-06-23 ENCOUNTER — Encounter: Payer: Self-pay | Admitting: Physical Medicine & Rehabilitation

## 2014-06-23 ENCOUNTER — Encounter: Payer: Self-pay | Admitting: Hematology and Oncology

## 2014-06-23 ENCOUNTER — Encounter
Payer: BC Managed Care – PPO | Attending: Physical Medicine & Rehabilitation | Admitting: Physical Medicine & Rehabilitation

## 2014-06-23 VITALS — BP 114/70 | HR 114 | Resp 16

## 2014-06-23 DIAGNOSIS — G959 Disease of spinal cord, unspecified: Secondary | ICD-10-CM | POA: Diagnosis not present

## 2014-06-23 DIAGNOSIS — B370 Candidal stomatitis: Secondary | ICD-10-CM | POA: Insufficient documentation

## 2014-06-23 DIAGNOSIS — C7931 Secondary malignant neoplasm of brain: Secondary | ICD-10-CM | POA: Diagnosis not present

## 2014-06-23 DIAGNOSIS — C7951 Secondary malignant neoplasm of bone: Secondary | ICD-10-CM | POA: Diagnosis not present

## 2014-06-23 DIAGNOSIS — C50919 Malignant neoplasm of unspecified site of unspecified female breast: Secondary | ICD-10-CM | POA: Insufficient documentation

## 2014-06-23 LAB — CULTURE, BLOOD (SINGLE)

## 2014-06-23 MED ORDER — FLUCONAZOLE 100 MG PO TABS: 100.0000 mg | ORAL_TABLET | Freq: Every day | ORAL | Status: DC

## 2014-06-23 NOTE — Progress Notes (Signed)
Subjective:    Patient ID: Carolyn [name], female    DOB: April 12, 1970, 44 y.o.   MRN: [id number]  HPI   Mrs. 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 She is working with therapy on balance and strength. She is doing therapy at [facility]. She has visits planned through July.   Pain is improved. She is still having some difficulties with coordination, sensation on the left side more than right.   She did develop a UTI for which she's on cipro. She's also had a sore throat as well.   Dr. [name] follows Abigail Butts for her breast cancer. She is currently on ibrance. She will go for an MRI of her brain soon.    Pain Inventory Average Pain 0 Pain Right Now 0 My pain is No Pain  In the last 24 hours, has pain interfered with the following? General activity 0 Relation with others 0 Enjoyment of life 0 What TIME of day is your pain at its worst? No Pain Sleep (in general) Fair  Pain is worse with: No Pain Pain improves with: No Pain Relief from Meds: 0  Mobility walk without assistance how many minutes can you walk? 20-25 ability to climb steps?  yes do you drive?  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  no  Physicians involved in your care Any changes since last visit?  no   Family History  Problem Relation Age of Onset  . Breast cancer Maternal Aunt 4  . Pancreatic cancer Maternal Grandfather   . Pancreatic cancer Maternal Aunt     died in her 31s  . Leukemia Maternal Aunt     died in her 84s  . Ovarian cancer Cousin 35    maternal cousin   History   Social History  . Marital Status: Single    Spouse Name: N/A  . Number of Children: N/A  . Years of Education: N/A   Social History Main Topics  . Smoking status: Never Smoker   . Smokeless tobacco: Never Used  . Alcohol Use: No  .  Drug Use: No  . Sexual Activity: Not Currently    Birth Control/ Protection: Condom   Other Topics Concern  . None   Social History Narrative   Past Surgical History  Procedure Laterality Date  . Wisdom tooth extraction    . Portacath placement  06/20/2011    Procedure: INSERTION PORT-A-CATH;  Surgeon: Haywood Lasso, MD;  Location: [place];  Service: General;  Laterality: N/A;  . Modified mastectomy  10/03/2011    Procedure: MODIFIED MASTECTOMY;  Surgeon: Haywood Lasso, MD;  Location: [place];  Service: General;  Laterality: Right;  . Port-a-cath removal  01/30/2012    Procedure: MINOR REMOVAL PORT-A-CATH;  Surgeon: Haywood Lasso, MD;  Location: [place];  Service: General;  Laterality: Left;  . Breast biopsy      Left  . Robotic assisted total hysterectomy with bilateral salpingo oopherectomy Bilateral 12/23/2012    Procedure: ROBOTIC ASSISTED TOTAL HYSTERECTOMY WITH BILATERAL SALPINGO OOPHORECTOMY;  Surgeon: Marvene Staff, MD;  Location: [place] [facility];  Service: Gynecology;  Laterality: Bilateral;  . Cholecystectomy N/A 03/01/2014    Procedure: LAPAROSCOPIC CHOLECYSTECTOMY;  Surgeon: Leighton Ruff, MD;  Location: [facility];  Service: General;  Laterality: N/A;  .  Laminectomy N/A 04/13/2014    Procedure: THORACIC LAMINECTOMY WITH FIXATION THORACIC SIX-THORACIC TEN FUSION;  Surgeon: Kristeen Miss, MD;  Location: [place] [facility];  Service: Neurosurgery;  Laterality: N/A;   Past Medical History  Diagnosis Date  . 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 Hx of radiation therapy 11/08/11 -12/26/11    right chest wall/supraclav fossa, right scar  . History of chemotherapy 06/27/11 -09/04/11    neoadjuvant  . S/P radiation therapy 05/08/14    SRS brain   BP 114/70 mmHg  Pulse 114  Resp 16  SpO2 100%  Opioid Risk Score:   Fall Risk Score: Low Fall Risk (0-5 points)`1  Depression screen PHQ  2/9  Depression screen PHQ 2/9 06/23/2014  Decreased Interest 0  Down, Depressed, Hopeless 0  PHQ - 2 Score 0  Altered sleeping 1  Tired, decreased energy 2  Change in appetite 0  Feeling bad or failure about yourself  0  Trouble concentrating 0  Moving slowly or fidgety/restless 0  Suicidal thoughts 0  PHQ-9 Score 3     Review of Systems  Constitutional: Negative.   HENT: Negative.   Eyes: Negative.   Respiratory: Negative.   Cardiovascular: Negative.   Gastrointestinal: Negative.   Endocrine: Negative.   Genitourinary: Negative.   Musculoskeletal: Negative.   Skin: Negative.   Allergic/Immunologic: Negative.   Neurological: Negative.   Hematological: Negative.   Psychiatric/Behavioral: Negative.        Objective:   Physical Exam  Constitutional: She is oriented to person, place, and time. She appears well-developed and well-nourished.  HENT: thrush on tongue Head: Normocephalic and atraumatic.  Eyes: Conjunctivae are normal. Pupils are equal, round, and reactive to light.  Neck: Normal range of motion. Neck supple.  Cardiovascular: Regular rhythm.    Respiratory: Effort normal and breath sounds normal. No respiratory distress. She has no wheezes.  GI: Soft. Bowel sounds are normal. She exhibits no distension. There is no tenderness.  Musculoskeletal: She exhibits no edema.  Neurological: She is alert and oriented to person, place, and time. No cranial nerve deficit.  Speech clear. Follows commands without difficulty.   Mild sensory deficits Bilateral LE: nearly 2/2 to LT,PP, proprioception. Strength 5/5 ue's prox to distal  . LE: 5 hf, 5 ke and 5 both ankles. BLE with 3+ DTR.balance is functional with straight cane (cane a little long for her)---left leg occasionally tremors when she walks.   Skin: Skin is warm and dry.  back incision clean and dry.  Psychiatric: She has a normal mood and affect. Her behavior is normal. Judgment and thought content  normal.    Assessment/Plan: 1. Functional deficits secondary to metastatic breast cancer to thoracic spine s/p decompression/fusion---subsequent myelopathy  -adjust cane by 2" less to better fit her arm/height  -complete outpt therapies  -recommended use of a pool for "water walking" this summer to further improve balance.  2. Oral candidiasis, ?esophageal involvement--7 days diflucan 3. Pain Management: on tylenol---using prn 4. Mood: very positive and motivated 5. Breast cancer mgt per Dr. [name]

## 2014-06-23 NOTE — Progress Notes (Signed)
I faxed disability forms to [phone] and will mail copy to patient for her records.

## 2014-06-23 NOTE — Patient Instructions (Signed)
PLEASE CALL ME WITH ANY PROBLEMS OR QUESTIONS (#[phone]).  HAVE A GOOD DAY!    

## 2014-06-23 NOTE — Progress Notes (Signed)
I will put copy of form upfront with ms. Wilma for pat on Wednesday visit

## 2014-06-24 ENCOUNTER — Other Ambulatory Visit (HOSPITAL_BASED_OUTPATIENT_CLINIC_OR_DEPARTMENT_OTHER): Payer: BC Managed Care – PPO

## 2014-06-24 ENCOUNTER — Ambulatory Visit: Payer: BC Managed Care – PPO | Admitting: Physical Therapy

## 2014-06-24 ENCOUNTER — Other Ambulatory Visit: Payer: Self-pay | Admitting: *Deleted

## 2014-06-24 ENCOUNTER — Encounter: Payer: Self-pay | Admitting: Nurse Practitioner

## 2014-06-24 ENCOUNTER — Other Ambulatory Visit (HOSPITAL_COMMUNITY)
Admission: RE | Admit: 2014-06-24 | Discharge: 2014-06-24 | Disposition: A | Discharge status: Home / Self Care | Payer: BC Managed Care – PPO | Source: Ambulatory Visit | Attending: Oncology | Admitting: Oncology

## 2014-06-24 ENCOUNTER — Ambulatory Visit (HOSPITAL_BASED_OUTPATIENT_CLINIC_OR_DEPARTMENT_OTHER): Payer: BC Managed Care – PPO | Admitting: Nurse Practitioner

## 2014-06-24 ENCOUNTER — Telehealth: Payer: Self-pay | Admitting: Nurse Practitioner

## 2014-06-24 ENCOUNTER — Encounter: Payer: Self-pay | Admitting: Physical Therapy

## 2014-06-24 VITALS — BP 91/71 | HR 119 | Temp 99.9°F | Resp 18 | Ht 68.0 in | Wt 128.6 lb

## 2014-06-24 DIAGNOSIS — C50811 Malignant neoplasm of overlapping sites of right female breast: Secondary | ICD-10-CM | POA: Diagnosis not present

## 2014-06-24 DIAGNOSIS — R6889 Other general symptoms and signs: Secondary | ICD-10-CM

## 2014-06-24 DIAGNOSIS — K769 Liver disease, unspecified: Secondary | ICD-10-CM

## 2014-06-24 DIAGNOSIS — R531 Weakness: Secondary | ICD-10-CM

## 2014-06-24 DIAGNOSIS — J029 Acute pharyngitis, unspecified: Secondary | ICD-10-CM

## 2014-06-24 DIAGNOSIS — R918 Other nonspecific abnormal finding of lung field: Secondary | ICD-10-CM

## 2014-06-24 DIAGNOSIS — B370 Candidal stomatitis: Secondary | ICD-10-CM | POA: Diagnosis present

## 2014-06-24 DIAGNOSIS — C50411 Malignant neoplasm of upper-outer quadrant of right female breast: Secondary | ICD-10-CM

## 2014-06-24 DIAGNOSIS — R05 Cough: Secondary | ICD-10-CM

## 2014-06-24 DIAGNOSIS — C773 Secondary and unspecified malignant neoplasm of axilla and upper limb lymph nodes: Secondary | ICD-10-CM | POA: Diagnosis not present

## 2014-06-24 DIAGNOSIS — C7951 Secondary malignant neoplasm of bone: Secondary | ICD-10-CM

## 2014-06-24 DIAGNOSIS — C7931 Secondary malignant neoplasm of brain: Secondary | ICD-10-CM

## 2014-06-24 DIAGNOSIS — R269 Unspecified abnormalities of gait and mobility: Secondary | ICD-10-CM

## 2014-06-24 DIAGNOSIS — Z170 Estrogen receptor positive status [ER+]: Secondary | ICD-10-CM

## 2014-06-24 DIAGNOSIS — R070 Pain in throat: Secondary | ICD-10-CM

## 2014-06-24 DIAGNOSIS — R262 Difficulty in walking, not elsewhere classified: Secondary | ICD-10-CM

## 2014-06-24 LAB — COMPREHENSIVE METABOLIC PANEL (CC13)
ALT: 33 U/L (ref 0–55)
AST: 22 U/L (ref 5–34)
Albumin: 2.8 g/dL — ABNORMAL LOW (ref 3.5–5.0)
Alkaline Phosphatase: 92 U/L (ref 40–150)
Anion Gap: 8 mEq/L (ref 3–11)
BUN: 7.3 mg/dL (ref 7.0–26.0)
CALCIUM: 9.3 mg/dL (ref 8.4–10.4)
CHLORIDE: 96 meq/L — AB (ref 98–109)
CO2: 29 mEq/L (ref 22–29)
CREATININE: 1 mg/dL (ref 0.6–1.1)
EGFR: 80 mL/min/{1.73_m2} — ABNORMAL LOW (ref >90)
GLUCOSE: 110 mg/dL (ref 70–140)
POTASSIUM: 3.9 meq/L (ref 3.5–5.1)
Sodium: 133 mEq/L — ABNORMAL LOW (ref 136–145)
Total Bilirubin: 0.62 mg/dL (ref 0.20–1.20)
Total Protein: 6.9 g/dL (ref 6.4–8.3)

## 2014-06-24 LAB — CBC WITH DIFFERENTIAL/PLATELET
BASO%: 0.7 % (ref 0.0–2.0)
BASOS ABS: 0 10*3/uL (ref 0.0–0.1)
EOS%: 0.7 % (ref 0.0–7.0)
Eosinophils Absolute: 0 10*3/uL (ref 0.0–0.5)
HCT: 28.9 % — ABNORMAL LOW (ref 34.8–46.6)
HEMOGLOBIN: 9.9 g/dL — AB (ref 11.6–15.9)
LYMPH%: 13.8 % — AB (ref 14.0–49.7)
MCH: 30 pg (ref 25.1–34.0)
MCHC: 34.3 g/dL (ref 31.5–36.0)
MCV: 87.6 fL (ref 79.5–101.0)
MONO#: 0 10*3/uL — AB (ref 0.1–0.9)
MONO%: 1.4 % (ref 0.0–14.0)
NEUT#: 1.2 10*3/uL — ABNORMAL LOW (ref 1.5–6.5)
NEUT%: 83.4 % — AB (ref 38.4–76.8)
NRBC: 0 % (ref 0–0)
PLATELETS: 74 10*3/uL — AB (ref 145–400)
RBC: 3.3 10*6/uL — ABNORMAL LOW (ref 3.70–5.45)
RDW: 14.8 % — ABNORMAL HIGH (ref 11.2–14.5)
WBC: 1.4 10*3/uL — ABNORMAL LOW (ref 3.9–10.3)
lymph#: 0.2 10*3/uL — ABNORMAL LOW (ref 0.9–3.3)

## 2014-06-24 LAB — RAPID STREP SCREEN (MED CTR MEBANE ONLY): STREPTOCOCCUS, GROUP A SCREEN (DIRECT): NEGATIVE

## 2014-06-24 MED ORDER — HYDROCODONE-HOMATROPINE 5-1.5 MG/5ML PO SYRP: 5.0000 mL | ORAL_SOLUTION | Freq: Four times a day (QID) | ORAL | Status: DC | PRN

## 2014-06-24 NOTE — Telephone Encounter (Signed)
Appointments made and avs printed for patient °

## 2014-06-24 NOTE — Progress Notes (Signed)
[place]  [phone] [phone]) [phone]     ID: YASHEKA FOSSETT DOB: [birth year]  MR#: [id number]  MVH#:[id number]  Patient Care Team: [name], MD as PCP - General (Family Medicine) Thea Silversmith, MD (Radiation Oncology) Neldon Mc, MD as Surgeon (General Surgery) Chauncey Cruel, MD as Consulting Physician (Oncology) OTHER M.D.JN Phillips: Estrogen receptor positive breast cancer  CURRENT TREATMENT: fulvestrant, palbociclib, zolendronate   BREAST CANCER HISTORY: From doctor Khan's of original intake node 05/31/2011:  "Felicita Nuncio is a 44 y.o. female. Without significant past medical history. She underwent a mammogram that showed calcifications measuring 5 cm on the right breast. She then went on To have an ultrasound which showed the area to measure 2.9 cm. She was also found to have a right axillary lymph node that was suspicious for a malignancy. She had a biopsy of the calcifications that showed high-grade ductal carcinoma in situ. 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 In the lymph node biopsy there was no lymphatic tissue seen and was felt that the node was replaced by tumor. Patient went on to have an MRI of the bilateral breasts performed on evening of 05/30/2011. The MRI showed in the right breast 5 x 3 x 4.5 cm mass abutting the chest wall. About 7 cm away from this there was another mass in the upper inner quadrant that measured 1.5 x 1.7 x 1.5 cm. On the contralateral breast, that is the left breast a 2 cm area suspicious enhancement was noted also this up to date has not been biopsied and arrangements are being made for the biopsy to be performed. In this side there were no suspicious lymph nodes. The prognostic panel is pending. Patient is otherwise without any complaints. "  METASTATIC DISEASE: Reine noted some strange feelings around her umbilicus 84/13/2440. This felt like  an area of numbness. Over the next 2 days she noted some leg weakness and difficulty walking, so she presented to the ED 04/12/2014. MRI of the thoraco-lumbar spine was obtained 04/12/2014 showing multiple bone lesions and compression fracture at T8 with retropulsion and cord compression. On 04/13/2014 she underwent Laminectomy T8 decompression of spinal cord posterior fixation from T6-T10 with pedicle screws and rods posterior arthrodesis with allograft. The pathology from this procedure ([id number] [id number]) showed metastatic adenocarcinoma which was estrogen receptor 69% positive, with moderate staining intensity, progesterone receptor negative. HER-2 could not be obtained.  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 There were nonspecific L temporal bone changes and also possible involvement of the clivus and calvarium, but no other parenchymal brain lesions. Further staging studies included a bone scan, which failed to show the lytic lesion seen on other scans, and CTs of the chest, abdomen and pelvis on 06/08/2011, which showed very small right lung and left liver lesions which will require follow-up  Her subsequent history is as detailed below  INTERVAL HISTORY: Myliyah returns today for follow up of her stage IV breast cancer. She continues on fulvestrant monthly, zolendronate every 3 months, and tomorrow will complete day 21 of her 1st cycle of palbociclib. She tolerates all drugs well with few complaints. She visited our symptom management clinic for a fever and dysuria. She was diagnosed with a UTI and treated with a course of cipro. She completed this antibiotic and is now passing urine  with no pain. New however is a sore throat and persistent dry cough for 3 days. She states she usually has the symptoms around this time of year, but one time it was strep throat. She is having mild low grade fevers around  99-100, but they almost always drop when rechecked. She has been taking robitussin for the cough, but it is not helping.  REVIEW OF SYSTEMS: Alfa denies fevers, chills, nausea, or vomiting. Occasionally she has diarrhea and I recommended she try imodium. She is eating and drinking well. She is doing well in physical therapy. She is no longer wearing the back brace and has been "upgraded" to a can instead of a walker. She denies pain. She has has mild hot flashes. She denies headaches, dizziness, or vision changes. She has some tingling and numbness to the bottom of her feet. A detailed review of systems is otherwise stable.   PAST MEDICAL HISTORY: Past Medical History  Diagnosis Date  . 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 Hx of radiation therapy 11/08/11 -12/26/11    right chest wall/supraclav fossa, right scar  . History of chemotherapy 06/27/11 -09/04/11    neoadjuvant  . S/P radiation therapy 05/08/14    SRS brain    PAST SURGICAL HISTORY: Past Surgical History  Procedure Laterality Date  . Wisdom tooth extraction    . Portacath placement  06/20/2011    Procedure: INSERTION PORT-A-CATH;  Surgeon: Haywood Lasso, MD;  Location: [place];  Service: General;  Laterality: N/A;  . Modified mastectomy  10/03/2011    Procedure: MODIFIED MASTECTOMY;  Surgeon: Haywood Lasso, MD;  Location: [place];  Service: General;  Laterality: Right;  . Port-a-cath removal  01/30/2012    Procedure: MINOR REMOVAL PORT-A-CATH;  Surgeon: Haywood Lasso, MD;  Location: [place];  Service: General;  Laterality: Left;  . Breast biopsy      Left  . Robotic assisted total hysterectomy with bilateral salpingo oopherectomy Bilateral 12/23/2012    Procedure: ROBOTIC ASSISTED TOTAL HYSTERECTOMY WITH BILATERAL SALPINGO OOPHORECTOMY;  Surgeon: Marvene Staff, MD;  Location: [place] [facility];  Service: Gynecology;  Laterality:  Bilateral;  . Cholecystectomy N/A 03/01/2014    Procedure: LAPAROSCOPIC CHOLECYSTECTOMY;  Surgeon: Leighton Ruff, MD;  Location[facility];  Service: General;  Laterality: N/A;  . Laminectomy N/A 04/13/2014    Procedure: THORACIC LAMINECTOMY WITH FIXATION THORACIC SIX-THORACIC TEN FUSION;  Surgeon: Kristeen Miss, MD;  Location: [place] [facility];  Service: Neurosurgery;  Laterality: N/A;    FAMILY HISTORY Family History  Problem Relation Age of Onset  . Breast cancer Maternal Aunt 71  . Pancreatic cancer Maternal Grandfather   . Pancreatic cancer Maternal Aunt     died in her 34s  . Leukemia Maternal Aunt     died in her 47s  . Ovarian cancer Cousin 50    maternal cousin   the patient's father is alive, currently 40 years old. The patient's mother died from complications of diabetes at the age of 56. The patient had no brothers, 4 sisters. One sister has died from congestive heart failure. The patient's mother is one of 5 sisters. One of them was diagnosed with breast cancer the age of 27. Also a cousin on the maternal side it was diagnosed with ovarian cancer. This was approximately age 80. There is also colon cancer in the family. The patient has had extensive genetic testing summarized below. She  is however BRCA negative  GYNECOLOGIC HISTORY:  No LMP recorded. Patient is not currently having periods (Reason: Chemotherapy). Menarche age 9, she is GX P0. She underwent total hysterectomy with bilateral salpingo-oophorectomy December 2014.  SOCIAL HISTORY:  Valorie works at [employer] for [employer]. She mostly deals with government documents. She is single, lives by herself, with no pets. She attends [employer] locally.    ADVANCED DIRECTIVES: Not in place. The patient has a documents and intends to name her sister Kaidyn Hernandes as healthcare power of attorney. Joelene Millin lives in [place] and can be reached at [place]: History  Substance Use Topics  . Smoking status: Never  Smoker   . Smokeless tobacco: Never Used  . Alcohol Use: No     Colonoscopy:  PAP:  Bone density:  Lipid panel:  Allergies  Allergen Reactions  . Allegra [Fexofenadine] Hives    Abdomen only  . Shellfish Allergy Hives    Abdomen only    Current Outpatient Prescriptions  Medication Sig Dispense Refill  . Calcium Carbonate-Vit D-Min (CALCIUM 1200 PO) Take 1 tablet by mouth 2 (two) times daily.    . Cholecalciferol (VITAMIN D3) 2000 UNITS TABS Take 2,000 Units by mouth daily.    . ciprofloxacin (CIPRO) 500 MG tablet Take 1 tablet (500 mg total) by mouth 2 (two) times daily. 14 tablet 0  . fluconazole (DIFLUCAN) 100 MG tablet Take 1 tablet (100 mg total) by mouth daily. For 7 days total 7 tablet 0  . iron polysaccharides (NIFEREX) 150 MG capsule Take 1 capsule (150 mg total) by mouth daily at 12 noon. 30 capsule 1  . Multiple Vitamin (MULTIVITAMIN) tablet Take 1 tablet by mouth daily.    . palbociclib (IBRANCE) 125 MG capsule Take 1 capsule (125 mg total) by mouth daily with breakfast. Take whole with food. 21 capsule 6  . acetaminophen (TYLENOL) 325 MG tablet Take 2 tablets (650 mg total) by mouth every 6 (six) hours as needed for mild pain, moderate pain, fever or headache. (Patient not taking: Reported on 06/24/2014)    . HYDROcodone-homatropine (HYCODAN) 5-1.5 MG/5ML syrup Take 5 mLs by mouth every 6 (six) hours as needed for cough. 120 mL 0   No current facility-administered medications for this visit.    OBJECTIVE: Middle-aged [place] American woman using a walker Filed Vitals:   06/24/14 1332  BP:   Pulse:   Temp: 99.9 F (37.7 C)  Resp:      Body mass index is 19.56 kg/(m^2).    ECOG FS:2 - Symptomatic, <50% confined to bed  Skin: warm, dry  HEENT: sclerae anicteric, conjunctivae pink, oropharynx clear. No thrush or mucositis.  Lymph Nodes: No cervical or supraclavicular lymphadenopathy  Lungs: clear to auscultation bilaterally, no rales, wheezes, or rhonci  Heart:  regular rate and rhythm  Abdomen: round, soft, non tender, positive bowel sounds  Musculoskeletal: No focal spinal tenderness, no peripheral edema  Neuro: non focal, well oriented, positive affect  Breasts: deferred  LAB RESULTS:  CMP     Component Value Date/Time   NA 133* 06/24/2014 1240   NA 141 04/21/2014 0910   K 3.9 06/24/2014 1240   K 3.6 04/21/2014 0910   CL 103 04/21/2014 0910   CL 102 01/16/2012 0804   CO2 29 06/24/2014 1240   CO2 27 04/21/2014 0910   GLUCOSE 110 06/24/2014 1240   GLUCOSE 133* 04/21/2014 0910   GLUCOSE 92 01/16/2012 0804   BUN 7.3 06/24/2014 1240  BUN 9 04/21/2014 0910   CREATININE 1.0 06/24/2014 1240   CREATININE 0.77 04/21/2014 0910   CALCIUM 9.3 06/24/2014 1240   CALCIUM 9.5 04/21/2014 0910   PROT 6.9 06/24/2014 1240   PROT 6.3 04/21/2014 0910   ALBUMIN 2.8* 06/24/2014 1240   ALBUMIN 3.1* 04/21/2014 0910   AST 22 06/24/2014 1240   AST 26 04/21/2014 0910   ALT 33 06/24/2014 1240   ALT 19 04/21/2014 0910   ALKPHOS 92 06/24/2014 1240   ALKPHOS 99 04/21/2014 0910   BILITOT 0.62 06/24/2014 1240   BILITOT 0.5 04/21/2014 0910   GFRNONAA >90 04/21/2014 0910   GFRAA >90 04/21/2014 0910    I No results found for: SPEP  Lab Results  Component Value Date   WBC 1.4* 06/24/2014   NEUTROABS 1.2* 06/24/2014   HGB 9.9* 06/24/2014   HCT 28.9* 06/24/2014   MCV 87.6 06/24/2014   PLT 74* 06/24/2014      Chemistry      Component Value Date/Time   NA 133* 06/24/2014 1240   NA 141 04/21/2014 0910   K 3.9 06/24/2014 1240   K 3.6 04/21/2014 0910   CL 103 04/21/2014 0910   CL 102 01/16/2012 0804   CO2 29 06/24/2014 1240   CO2 27 04/21/2014 0910   BUN 7.3 06/24/2014 1240   BUN 9 04/21/2014 0910   CREATININE 1.0 06/24/2014 1240   CREATININE 0.77 04/21/2014 0910      Component Value Date/Time   CALCIUM 9.3 06/24/2014 1240   CALCIUM 9.5 04/21/2014 0910   ALKPHOS 92 06/24/2014 1240   ALKPHOS 99 04/21/2014 0910   AST 22 06/24/2014 1240    AST 26 04/21/2014 0910   ALT 33 06/24/2014 1240   ALT 19 04/21/2014 0910   BILITOT 0.62 06/24/2014 1240   BILITOT 0.5 04/21/2014 0910       Lab Results  Component Value Date   LABCA2 24 05/31/2011    No components found for: [id number]  No results for input(s): INR in the last 168 hours.  Urinalysis No results found for: COLORURINE  STUDIES: Dg Chest 2 View  06/17/2014   CLINICAL DATA:  Fever and cough.  EXAM: CHEST  2 VIEW  COMPARISON:  Chest CT 04/15/2014. Thoracic spine radiographs 05/21/2014 and 04/13/2014.  FINDINGS: The cardiomediastinal silhouette is within normal limits. Mild scarring is again seen in the right lung apex. Minimal nodularity in the left mid lung is similar to prior thoracic spine radiographs. No confluent airspace opacity, pulmonary edema, pleural effusion, or pneumothorax is identified. Prior right mastectomy in right axillary dissection are again identified. Mid thoracic vertebral compression fracture and adjacent posterior spinal fusion are again noted.  IMPRESSION: No evidence of acute airspace disease.   Electronically Signed   By: Logan Bores   On: 06/17/2014 10:44     ASSESSMENT: 44 y.o. BRCA negative [place] woman status post right breast upper outer quadrant and right axillary lymph node biopsy 05/18/2011, both positive for an invasive ductal carcinoma, high-grade, clinicallyT2 N1-2 or stage IIB/IIIA,  estrogen receptor 100% positive, progesterone receptor 87% positive, with an MIB-1 of 14% and no HER-2 amplification ([id number]).  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   (a) VUS were also found in [place] and BARD1  (2) additional right breast biopsy upper inner quadrant 06/08/2011 showed only a fibroadenoma, and central left breast biopsy for another suspicious lesion showed only fibrocystic changes ([id number] and [id number]).   (  3)Treated neoadjuvantly with cyclophosphamide and  docetaxel x4 completed 08/29/2011.  (4) status post right modified radical mastectomy 10/03/2011 showing a residual pT1c pN1a (3/18 lymph nodes positive) invasive ductal carcinoma, grade 1,estrogen receptor 100% positive, progesterone receptor negative, with no HER-2 amplification ([id number])  (a) anterolateral mastectomy pending, to be followed by reconstruction  (5) adjuvant radiation to the right chest wall, right supraclavicular fossa and right scar completed 12/26/2011  (6) tamoxifen started January 2014-- discontinued April 2016 with evidence of metastatic disease  (7) status post total hysterectomy with bilateral salpingo-oophorectomy 12/23/2012 with benign pathology ([id number])  METASTATIC DISEASE 04/13/2014 (8) presented with T8 cord compression and underwent laminectomy 04/13/2014 with T8 decompression of spinal cord, posterior fixation from T6-T10 with pedicle screws and rods, posterior arthrodesis with allograft. 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 ExacTrac Snap verification was performed for each couch angle.   (b) radiation to the T-spine completed 05/08/2014.  (11) to start fulvestrant 05/18/2014 and Palbociclib 06/01/2014  (12) to start zolendronate 05/18/2014  PLAN: I had a rapid strep test performed on Elfa, and this was negative. She was recently treated for a UTI and the chest xray and blood cultures performed last week were negative. I don't find good cause to prescribe any antibiotics at this time,  despite her occasional low grade fever. She will treat her sore throat symptomatically with warm salt water rinses and warm teas. I have prescribed a hycodan syrup for the dry cough, and suggested she try an antihistamine such as claritin or allegra since she thinks this may be motivated by allergies.   The labs were reviewed in detail, and her ANHirams at 1.2 with just one more day of palbociclib to go. She has successfully completed her first cycle of this treatment with no overwhelming neutropenia. She does have a worsening hgb and platelet count however, down to 9.9 and 74 respectively, but she is asymptomatic.   Her next fulvestrant injection will be due on 7/6 and she will have zometa in August. She will continue weekly labs for the neutropenia caused by the palbociclib for now, but so far has demonstrated remarkable tolerance.   WeGabryelleill return on 7/20 for labs and a follow up visit with Dr. MaJana HakimShe understands and agrees with this plan. She knows the goal of treatment in her case is control. She has been encouraged to call with any issues that might arise before her next visit here.   HeLaurie PandaNP   06/24/2014 3:11 PM

## 2014-06-24 NOTE — Therapy (Signed)
[facility] [address], [place], [place], [address] Phone: [phone]   Fax:  [phone]  Physical Therapy Treatment  Patient Details  Name: Carolyn [name] MRN: [id number] Date of Birth: September 29, 1970 Referring Provider:  [name], MD  Encounter Date: 06/24/2014      PT End of Session - 06/24/14 1036    Visit Number 8   Date for PT Re-Evaluation 07/21/14   PT Start Time 1014   PT Stop Time 1103   PT Time Calculation (min) 49 min   Equipment Utilized During Treatment Back brace;Other (comment)   Behavior During Therapy WFL for tasks assessed/performed      Past Medical History  Diagnosis Date  . 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 Hx of radiation therapy 11/08/11 -12/26/11    right chest wall/supraclav fossa, right scar  . History of chemotherapy 06/27/11 -09/04/11    neoadjuvant  . S/P radiation therapy 05/08/14    SRS brain    Past Surgical History  Procedure Laterality Date  . Wisdom tooth extraction    . Portacath placement  06/20/2011    Procedure: INSERTION PORT-A-CATH;  Surgeon: Haywood Lasso, MD;  Location: [place];  Service: General;  Laterality: N/A;  . Modified mastectomy  10/03/2011    Procedure: MODIFIED MASTECTOMY;  Surgeon: Haywood Lasso, MD;  Location: [facility] [facility];  Service: General;  Laterality: Right;  . Port-a-cath removal  01/30/2012    Procedure: MINOR REMOVAL PORT-A-CATH;  Surgeon: Haywood Lasso, MD;  Location: [place];  Service: General;  Laterality: Left;  . Breast biopsy      Left  . Robotic assisted total hysterectomy with bilateral salpingo oopherectomy Bilateral 12/23/2012    Procedure: ROBOTIC ASSISTED TOTAL HYSTERECTOMY WITH BILATERAL SALPINGO OOPHORECTOMY;  Surgeon: Marvene Staff, MD;  Location: [facility];  Service: Gynecology;  Laterality: Bilateral;  . Cholecystectomy N/A  03/01/2014    Procedure: LAPAROSCOPIC CHOLECYSTECTOMY;  Surgeon: Leighton Ruff, MD;  Location: [facility];  Service: General;  Laterality: N/A;  . Laminectomy N/A 04/13/2014    Procedure: THORACIC LAMINECTOMY WITH FIXATION THORACIC SIX-THORACIC TEN FUSION;  Surgeon: Kristeen Miss, MD;  Location: [place];  Service: Neurosurgery;  Laterality: N/A;    There were no vitals filed for this visit.  Visit Diagnosis:  Weakness generalized  Difficulty walking  Activity intolerance  Abnormality of gait      Subjective Assessment - 06/24/14 1024    Subjective Pt reports slowly recovering from UTI, today is last day to take meds for this   Currently in Pain? No/denies                         [facility] Adult PT Treatment/Exercise - 06/24/14 0001    Ambulation/Gait   Ambulation/Gait Yes  amb with SPC over 4" & 6" step, over blue balance pads & aro   Ambulation/Gait Assistance 6: Modified independent (Device/Increase time)   Lumbar Exercises: Aerobic   Stationary Bike Bike L 1 x 10 min   Lumbar Exercises: Standing   Other Standing Lumbar Exercises Bodyblade in standing 5x 30 sec vertical & horizontal grip with focus on abdominal bracing   Other Standing Lumbar Exercises Red physioball x 10up the wall with focus on abdominal bracing    Lumbar Exercises: Seated   Other Seated Lumbar Exercises Ball squezzes 2x10 with blue ball  Knee/Hip Exercises: Seated   Long Arc Quad Strengthening;Both;10 reps;3 sets   [employer] Weight 4 lbs.                  PT Short Term Goals - 06/24/14 1041    PT SHORT TERM GOAL #1   Title be independent in initial HEP   Time 4   Period Weeks   Status Achieved   PT SHORT TERM GOAL #2   Title improve strength and safety to allow to wean from walker to cane 50% of the time in the home   Time 4   Period Weeks   Status Achieved   PT SHORT TERM GOAL #3   Title walk for 25 minutes with walker without significant fatigue   Time 4   Period  Weeks   Status Achieved           PT Long Term Goals - 06/24/14 1042    PT LONG TERM GOAL #1   Title be independent in advanced HEP   Time 8   Period Weeks   Status On-going   PT LONG TERM GOAL #2   Title wean from walker to use of cane at least 50% in the community   Time 8   Period Weeks   Status Achieved   PT LONG TERM GOAL #3   Title demonstrate heel to toe gait pattern on the Lt with level surface gait at least 50% of the time   Time 8   Period Weeks   Status On-going   PT LONG TERM GOAL #4   Title improve endurance to walk for 25 minutes with use of cane in the community   Time 8   Period Weeks   Status Achieved   PT LONG TERM GOAL #5   Title demonstrate 4+/5 Lt hip flexor and DF strength to improve safety with gait   Time 8   Period Weeks   Status On-going               Plan - 06/24/14 1038    Clinical Impression Statement Pt is as expected, due to med history, slowly progressing toward all goals.   Pt will benefit from skilled therapeutic intervention in order to improve on the following deficits Abnormal gait;Difficulty walking;Decreased endurance;Decreased activity tolerance;Decreased strength;Decreased balance   Rehab Potential Good   PT Frequency 2x / week   PT Duration 8 weeks   PT Treatment/Interventions ADLs/Self Care Home Management;Therapeutic activities;Patient/family education;Therapeutic exercise;Manual techniques;Gait training;Cryotherapy;Neuromuscular re-education;Electrical Stimulation;Functional mobility training   PT Next Visit Plan continue core strengthening, BLE strengthening and endurance activities. Balance.   Consulted and Agree with Plan of Care Patient        Problem List Patient Active Problem List   Diagnosis Date Noted  . Oral thrush 06/23/2014  . Fever 06/17/2014  . Hypokalemia 06/17/2014  . Breast cancer metastasized to brain   . 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 Malnutrition of moderate degree 04/13/2014   . Myelopathy 04/12/2014  . Pathologic fracture of thoracic vertebrae 04/12/2014  . Biliary colic 56/31/4970  . Breast cancer of upper-outer quadrant of right female breast 09/24/2013  . S/P total hysterectomy and bilateral salpingo-oophorectomy 12/23/2012  . History of chemotherapy   . Hx of radiation therapy     NAUMANN-HOUEGNIFIO,Breindy Meadow PTA 06/24/2014, 1:42 PM  [facility] [address], [place], [place], [address] Phone: [phone]   Fax:  [phone]

## 2014-06-25 ENCOUNTER — Telehealth: Payer: Self-pay | Admitting: *Deleted

## 2014-06-25 NOTE — Telephone Encounter (Signed)
Called to inform pt of test results concerning the Rapid Strep test done on yesterday. Communicated with pt that test results were negative. Pt said her throat felt much better today, no longer having pain when she swallows to the right of her throat. She said the cough syrup really helped. Advised her to still take the Claritin daily and do warm salt water rinses until all symptoms are gone. Pt has not had any more fevers as well. Message to be forwarded to Gentry Fitz, NP.

## 2014-06-26 ENCOUNTER — Ambulatory Visit
Admission: RE | Admit: 2014-06-26 | Discharge: 2014-06-26 | Disposition: A | Discharge status: Home / Self Care | Payer: BC Managed Care – PPO | Source: Ambulatory Visit | Attending: Radiation Oncology | Admitting: Radiation Oncology

## 2014-06-26 ENCOUNTER — Encounter: Payer: Self-pay | Admitting: Radiation Therapy

## 2014-06-26 DIAGNOSIS — C7931 Secondary malignant neoplasm of brain: Secondary | ICD-10-CM

## 2014-06-26 MED ORDER — GADOBENATE DIMEGLUMINE 529 MG/ML IV SOLN
12.0000 mL | Freq: Once | INTRAVENOUS | Status: AC | PRN
  Administered 2014-06-26: 12 mL via INTRAVENOUS

## 2014-06-29 ENCOUNTER — Encounter: Payer: BC Managed Care – PPO | Admitting: Physical Therapy

## 2014-06-29 ENCOUNTER — Ambulatory Visit
Admission: RE | Admit: 2014-06-29 | Payer: BC Managed Care – PPO | Source: Ambulatory Visit | Admitting: Radiation Oncology

## 2014-06-30 ENCOUNTER — Telehealth: Payer: Self-pay | Admitting: *Deleted

## 2014-06-30 ENCOUNTER — Other Ambulatory Visit: Payer: Self-pay | Admitting: Radiation Therapy

## 2014-06-30 DIAGNOSIS — C7931 Secondary malignant neoplasm of brain: Secondary | ICD-10-CM

## 2014-06-30 NOTE — Telephone Encounter (Signed)
Patient called asking results of MRI  Done 06/26/14, Dr. Lisbeth Renshaw is out of office today, and I am unable to give those results without Dr'., approval, will inbasket MD and transferred call to Alvin Critchley, Brain navigator 11:00 AM

## 2014-07-01 ENCOUNTER — Other Ambulatory Visit: Payer: BC Managed Care – PPO

## 2014-07-01 ENCOUNTER — Encounter: Payer: BC Managed Care – PPO | Admitting: Physical Therapy

## 2014-07-06 ENCOUNTER — Encounter: Payer: BC Managed Care – PPO | Admitting: Physical Therapy

## 2014-07-07 ENCOUNTER — Ambulatory Visit: Payer: BC Managed Care – PPO

## 2014-07-07 DIAGNOSIS — R262 Difficulty in walking, not elsewhere classified: Secondary | ICD-10-CM

## 2014-07-07 DIAGNOSIS — R6889 Other general symptoms and signs: Secondary | ICD-10-CM

## 2014-07-07 DIAGNOSIS — R531 Weakness: Secondary | ICD-10-CM | POA: Diagnosis not present

## 2014-07-07 DIAGNOSIS — R269 Unspecified abnormalities of gait and mobility: Secondary | ICD-10-CM

## 2014-07-07 NOTE — Therapy (Signed)
[facility] [address], [place], [place], [address] Phone: [phone]   Fax:  [phone]  Physical Therapy Treatment  Patient Details  Name: Carolyn [name] MRN: [id number] Date of Birth: 11-01-1970 Referring Provider:  [name], MD  Encounter Date: 07/07/2014      PT End of Session - 07/07/14 1013    Visit Number 9   Date for PT Re-Evaluation 07/21/14   PT Start Time 0931   PT Stop Time 1017   PT Time Calculation (min) 46 min   Activity Tolerance Patient tolerated treatment well   Behavior During Therapy [facility] for tasks assessed/performed      Past Medical History  Diagnosis Date  . 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 Hx of radiation therapy 11/08/11 -12/26/11    right chest wall/supraclav fossa, right scar  . History of chemotherapy 06/27/11 -09/04/11    neoadjuvant  . S/P radiation therapy 05/08/14    SRS brain    Past Surgical History  Procedure Laterality Date  . Wisdom tooth extraction    . Portacath placement  06/20/2011    Procedure: INSERTION PORT-A-CATH;  Surgeon: Haywood Lasso, MD;  Location: [place];  Service: General;  Laterality: N/A;  . Modified mastectomy  10/03/2011    Procedure: MODIFIED MASTECTOMY;  Surgeon: Haywood Lasso, MD;  Location: [place];  Service: General;  Laterality: Right;  . Port-a-cath removal  01/30/2012    Procedure: MINOR REMOVAL PORT-A-CATH;  Surgeon: Haywood Lasso, MD;  Location: [place];  Service: General;  Laterality: Left;  . Breast biopsy      Left  . Robotic assisted total hysterectomy with bilateral salpingo oopherectomy Bilateral 12/23/2012    Procedure: ROBOTIC ASSISTED TOTAL HYSTERECTOMY WITH BILATERAL SALPINGO OOPHORECTOMY;  Surgeon: Marvene Staff, MD;  Location: [facility];  Service: Gynecology;  Laterality: Bilateral;  . Cholecystectomy N/A 03/01/2014     Procedure: LAPAROSCOPIC CHOLECYSTECTOMY;  Surgeon: Leighton Ruff, MD;  [facility];  Service: General;  Laterality: N/A;  . Laminectomy N/A 04/13/2014    Procedure: THORACIC LAMINECTOMY WITH FIXATION THORACIC SIX-THORACIC TEN FUSION;  Surgeon: Kristeen Miss, MD;  Location: [place] [facility];  Service: Neurosurgery;  Laterality: N/A;    There were no vitals filed for this visit.  Visit Diagnosis:  Weakness generalized  Difficulty walking  Activity intolerance  Abnormality of gait      Subjective Assessment - 07/07/14 0932    Subjective Pt went on a trip to [place] last week.     Currently in Pain? No/denies            [facility] PT Assessment - 07/07/14 0001    Strength   Overall Strength Within functional limits for tasks performed   Overall Strength Comments Lt hip flexor 4/5, Rt hip flexor 4+/5                     [facility] Adult PT Treatment/Exercise - 07/07/14 0001    Lumbar Exercises: Aerobic   Stationary Bike Bike L 1 x 10 min   UBE (Upper Arm Bike) Level 1 standingx 6 minutes (3/3)   Knee/Hip Exercises: Seated   Long Arc Quad Strengthening;Both;10 reps;3 sets   [employer] Weight 4 lbs.   Other Seated Knee/Hip Exercises marching 2x10 bil. with 2#   Shoulder Exercises: Seated   Horizontal ABduction Strengthening;Both;20 reps   Theraband Level (  Shoulder Horizontal ABduction) Level 2 (Red)   Other Seated Exercises 3 way raises: 1# added 2x10 each                  PT Short Term Goals - 06/24/14 1041    PT SHORT TERM GOAL #1   Title be independent in initial HEP   Time 4   Period Weeks   Status Achieved   PT SHORT TERM GOAL #2   Title improve strength and safety to allow to wean from walker to cane 50% of the time in the home   Time 4   Period Weeks   Status Achieved   PT SHORT TERM GOAL #3   Title walk for 25 minutes with walker without significant fatigue   Time 4   Period Weeks   Status Achieved           PT Long Term Goals -  07/07/14 5809    PT LONG TERM GOAL #1   Title be independent in advanced HEP   Time 8   Period Weeks   Status On-going  independent in current HEP   PT LONG TERM GOAL #2   Title wean from walker to use of cane at least 50% in the community   Status Achieved  100% cane use   PT LONG TERM GOAL #3   Title demonstrate heel to toe gait pattern on the Lt with level surface gait at least 50% of the time   Time 8   Period Weeks   Status On-going  foot flat when fatigued   PT LONG TERM GOAL #4   Title improve endurance to walk for 25 minutes with use of cane in the community   Time 8   Status Achieved  20-25 minutes reported   PT LONG TERM GOAL #5   Title demonstrate 4+/5 Lt hip flexor and DF strength to improve safety with gait   Time 8   Period Weeks   Status On-going  4/5 Lt hip flexor               Plan - 07/07/14 0953    Clinical Impression Statement Pt was out of town last week so limited progress toward goals.  Pt with fatigue of UEs with addition of red theraband today.  Pt ambulates all distances with standard cane and demonstrates shuffling gait pattern when she is fatigued.  Pt continues to demonstrate UE/LE weakness and fatigue associated with cancer treatment and s/p surgery.  Pt will benefit from advanced endurance and strength progression to return to prior level of function.     Pt will benefit from skilled therapeutic intervention in order to improve on the following deficits Abnormal gait;Difficulty walking;Decreased endurance;Decreased activity tolerance;Decreased strength;Decreased balance   Rehab Potential Good   PT Frequency 2x / week   PT Duration 8 weeks   PT Treatment/Interventions ADLs/Self Care Home Management;Therapeutic activities;Patient/family education;Therapeutic exercise;Manual techniques;Gait training;Cryotherapy;Neuromuscular re-education;Electrical Stimulation;Functional mobility training   PT Next Visit Plan UE/LE strength and endurance, gait    Consulted and Agree with Plan of Care Patient        Problem List Patient Active Problem List   Diagnosis Date Noted  . Oral thrush 06/23/2014  . Fever 06/17/2014  . Hypokalemia 06/17/2014  . Breast cancer metastasized to brain   . 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 Malnutrition of moderate degree 04/13/2014  . Myelopathy 04/12/2014  . Pathologic fracture of thoracic vertebrae 04/12/2014  . Biliary colic 98/33/8250  .  Breast cancer of upper-outer quadrant of right female breast 09/24/2013  . S/P total hysterectomy and bilateral salpingo-oophorectomy 12/23/2012  . History of chemotherapy   . Hx of radiation therapy     TAKACS,KELLY, PT 07/07/2014, 10:15 AM  [facility] [address], [place], [place], [address] Phone: [phone]   Fax:  [phone]

## 2014-07-08 ENCOUNTER — Other Ambulatory Visit (HOSPITAL_BASED_OUTPATIENT_CLINIC_OR_DEPARTMENT_OTHER): Payer: BC Managed Care – PPO

## 2014-07-08 ENCOUNTER — Encounter: Payer: BC Managed Care – PPO | Admitting: Physical Therapy

## 2014-07-08 DIAGNOSIS — K769 Liver disease, unspecified: Secondary | ICD-10-CM

## 2014-07-08 DIAGNOSIS — C50811 Malignant neoplasm of overlapping sites of right female breast: Secondary | ICD-10-CM | POA: Diagnosis not present

## 2014-07-08 DIAGNOSIS — C50411 Malignant neoplasm of upper-outer quadrant of right female breast: Secondary | ICD-10-CM

## 2014-07-08 LAB — COMPREHENSIVE METABOLIC PANEL (CC13)
ALBUMIN: 2.8 g/dL — AB (ref 3.5–5.0)
ALK PHOS: 82 U/L (ref 40–150)
ALT: 12 U/L (ref 0–55)
AST: 14 U/L (ref 5–34)
Anion Gap: 10 mEq/L (ref 3–11)
BUN: 6.8 mg/dL — ABNORMAL LOW (ref 7.0–26.0)
CHLORIDE: 102 meq/L (ref 98–109)
CO2: 30 mEq/L — ABNORMAL HIGH (ref 22–29)
CREATININE: 0.8 mg/dL (ref 0.6–1.1)
Calcium: 9.4 mg/dL (ref 8.4–10.4)
EGFR: >90 mL/min/{1.73_m2} (ref >90)
Glucose: 96 mg/dl (ref 70–140)
POTASSIUM: 4.1 meq/L (ref 3.5–5.1)
Sodium: 142 mEq/L (ref 136–145)
Total Bilirubin: 0.34 mg/dL (ref 0.20–1.20)
Total Protein: 6.8 g/dL (ref 6.4–8.3)

## 2014-07-08 LAB — CBC WITH DIFFERENTIAL/PLATELET
BASO%: 1 % (ref 0.0–2.0)
Basophils Absolute: 0 10*3/uL (ref 0.0–0.1)
EOS%: 4.5 % (ref 0.0–7.0)
Eosinophils Absolute: 0.1 10*3/uL (ref 0.0–0.5)
HCT: 27.5 % — ABNORMAL LOW (ref 34.8–46.6)
HGB: 9.1 g/dL — ABNORMAL LOW (ref 11.6–15.9)
LYMPH%: 16.8 % (ref 14.0–49.7)
MCH: 30.5 pg (ref 25.1–34.0)
MCHC: 33.1 g/dL (ref 31.5–36.0)
MCV: 92.3 fL (ref 79.5–101.0)
MONO#: 0.1 10*3/uL (ref 0.1–0.9)
MONO%: 6.4 % (ref 0.0–14.0)
NEUT#: 1.4 10*3/uL — ABNORMAL LOW (ref 1.5–6.5)
NEUT%: 71.3 % (ref 38.4–76.8)
PLATELETS: 292 10*3/uL (ref 145–400)
RBC: 2.98 10*6/uL — AB (ref 3.70–5.45)
RDW: 16.5 % — AB (ref 11.2–14.5)
WBC: 2 10*3/uL — ABNORMAL LOW (ref 3.9–10.3)
lymph#: 0.3 10*3/uL — ABNORMAL LOW (ref 0.9–3.3)

## 2014-07-09 ENCOUNTER — Ambulatory Visit: Payer: BC Managed Care – PPO

## 2014-07-09 DIAGNOSIS — R531 Weakness: Secondary | ICD-10-CM

## 2014-07-09 DIAGNOSIS — R262 Difficulty in walking, not elsewhere classified: Secondary | ICD-10-CM

## 2014-07-09 DIAGNOSIS — R269 Unspecified abnormalities of gait and mobility: Secondary | ICD-10-CM

## 2014-07-09 DIAGNOSIS — R6889 Other general symptoms and signs: Secondary | ICD-10-CM

## 2014-07-09 NOTE — Patient Instructions (Addendum)
Abductor Strength: Chair Pose (Strap, Wall)   Sit in a chair. Make strap wide enough to brace knees at hip width. Push knees out. Press into theraband.  Repeat 10x for 3 sets. Repeat _2x per day.___= Copyright  VHI. All rights reserved.  Flexibility: Corner Stretch   Standing in corner with hands just above shoulder level and lean forward until a comfortable stretch is felt across chest. Hold __15__ seconds. Repeat __3__ times per set.  Do _2x___ sessions per day.  http://orth.exer.us/343   Copyright  VHI. All rights reserved.  [facility] [address] [place], [place] [address] Phone # [phone] Fax [phone]

## 2014-07-09 NOTE — Therapy (Signed)
[facility] [address], [place], [place], [address] Phone: [phone]   Fax:  [phone]  Physical Therapy Treatment  Patient Details  Name: Carolyn [name] MRN: [id number] Date of Birth: 1970-12-18 Referring Provider:  [name], MD  Encounter Date: 07/09/2014      PT End of Session - 07/09/14 1108    Visit Number 10   Date for PT Re-Evaluation 07/21/14   PT Start Time 1102   PT Stop Time 1150   PT Time Calculation (min) 48 min   Activity Tolerance Patient tolerated treatment well   Behavior During Therapy [facility] for tasks assessed/performed      Past Medical History  Diagnosis Date  . 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 Hx of radiation therapy 11/08/11 -12/26/11    right chest wall/supraclav fossa, right scar  . History of chemotherapy 06/27/11 -09/04/11    neoadjuvant  . S/P radiation therapy 05/08/14    SRS brain    Past Surgical History  Procedure Laterality Date  . Wisdom tooth extraction    . Portacath placement  06/20/2011    Procedure: INSERTION PORT-A-CATH;  Surgeon: Haywood Lasso, MD;  Location: [place];  Service: General;  Laterality: N/A;  . Modified mastectomy  10/03/2011    Procedure: MODIFIED MASTECTOMY;  Surgeon: Haywood Lasso, MD;  Location: [place];  Service: General;  Laterality: Right;  . Port-a-cath removal  01/30/2012    Procedure: MINOR REMOVAL PORT-A-CATH;  Surgeon: Haywood Lasso, MD;  Location: [place];  Service: General;  Laterality: Left;  . Breast biopsy      Left  . Robotic assisted total hysterectomy with bilateral salpingo oopherectomy Bilateral 12/23/2012    Procedure: ROBOTIC ASSISTED TOTAL HYSTERECTOMY WITH BILATERAL SALPINGO OOPHORECTOMY;  Surgeon: Marvene Staff, MD;  Location: [facility];  Service: Gynecology;  Laterality: Bilateral;  . Cholecystectomy N/A 03/01/2014     Procedure: LAPAROSCOPIC CHOLECYSTECTOMY;  Surgeon: Leighton Ruff, MD;  [facility];  Service: General;  Laterality: N/A;  . Laminectomy N/A 04/13/2014    Procedure: THORACIC LAMINECTOMY WITH FIXATION THORACIC SIX-THORACIC TEN FUSION;  Surgeon: Kristeen Miss, MD;  Location: [place] [facility];  Service: Neurosurgery;  Laterality: N/A;    There were no vitals filed for this visit.  Visit Diagnosis:  Weakness generalized  Difficulty walking  Activity intolerance  Abnormality of gait      Subjective Assessment - 07/09/14 1105    Subjective Feeling fine since Tuesday.    Pertinent History thoracic spine surgery for rod placement 04/12/14-Dr Elsner   Currently in Pain? No/denies                         [facility] Adult PT Treatment/Exercise - 07/09/14 0001    Lumbar Exercises: Aerobic   Stationary Bike Bike L 2 x 6 min    UBE (Upper Arm Bike) Level 1 standingx 6 minutes (3/3)   Knee/Hip Exercises: Machines for Strengthening   Cybex Leg Press 3x7; 50#, rest break in between sets    Knee/Hip Exercises: Seated   Other Seated Knee/Hip Exercises --   Other Seated Knee/Hip Exercises abduction with yellow theraband   3x10    Shoulder Exercises: Seated   Horizontal ABduction Strengthening;Both;20 reps   Theraband Level (Shoulder Horizontal ABduction) Level 2 (Red)   Other Seated Exercises 3 way raises: 1# added 2x10 each  No weight added for abduction due to pec minor tightness    Shoulder Exercises: Stretch   Corner Stretch 20 seconds;3 reps  VC for appropriate position                 PT Education - 07/09/14 1156    Education provided Yes   Education Details Pec stretch, seated abduction    Person(s) Educated Patient   Methods Explanation;Demonstration;Handout   Comprehension Verbalized understanding;Returned demonstration          PT Short Term Goals - 07/09/14 1129    PT SHORT TERM GOAL #1   Title be independent in initial HEP           PT Long Term  Goals - 07/07/14 0933    PT LONG TERM GOAL #1   Title be independent in advanced HEP   Time 8   Period Weeks   Status On-going  independent in current HEP   PT LONG TERM GOAL #2   Title wean from walker to use of cane at least 50% in the community   Status Achieved  100% cane use   PT LONG TERM GOAL #3   Title demonstrate heel to toe gait pattern on the Lt with level surface gait at least 50% of the time   Time 8   Period Weeks   Status On-going  foot flat when fatigued   PT LONG TERM GOAL #4   Title improve endurance to walk for 25 minutes with use of cane in the community   Time 8   Status Achieved  20-25 minutes reported   PT LONG TERM GOAL #5   Title demonstrate 4+/5 Lt hip flexor and DF strength to improve safety with gait   Time 8   Period Weeks   Status On-going  4/5 Lt hip flexor               Plan - 07/09/14 1259    Clinical Impression Statement Improved tolerance today for seated LE exercises today; however continued to require 30 second rest breaks between each set.  UE strenght/mobility limited by pec minor tightness; provided corner stretch for HEP to decrease tightness. Tolerated UE/LE endurance activitites well; able to ambulate with cane for 10-15 minutes functionally. Will continue to benefit from skilled PT  continued ambulation endurance and general strengthening. for return to functional activities.    Pt will benefit from skilled therapeutic intervention in order to improve on the following deficits Abnormal gait;Difficulty walking;Decreased endurance;Decreased activity tolerance;Decreased strength;Decreased balance   Rehab Potential Good   PT Frequency 2x / week   PT Duration 8 weeks   PT Treatment/Interventions ADLs/Self Care Home Management;Therapeutic activities;Patient/family education;Therapeutic exercise;Manual techniques;Gait training;Cryotherapy;Neuromuscular re-education;Electrical Stimulation;Functional mobility training;Energy conservation    PT Next Visit Plan Gait/ambulation practice with gait ladder, continue with leg press, review HEP    Consulted and Agree with Plan of Care Patient        Problem List Patient Active Problem List   Diagnosis Date Noted  . Oral thrush 06/23/2014  . Fever 06/17/2014  . Hypokalemia 06/17/2014  . Breast cancer metastasized to brain   . 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 Malnutrition of moderate degree 04/13/2014  . Myelopathy 04/12/2014  . Pathologic fracture of thoracic vertebrae 04/12/2014  . Biliary colic 09/32/6712  . Breast cancer of upper-outer quadrant of right female breast 09/24/2013  . S/P total hysterectomy and bilateral salpingo-oophorectomy 12/23/2012  . History of chemotherapy   . Hx of radiation  therapy    Reginal Lutes, SPT 07/09/2014 1:15 PM  During this treatment session, the therapist was present, participating in, and directing the treatment. TAKACS,KELLY, PT 07/09/2014, 1:15 PM  [facility] [address], [place], [place], [address] Phone: [phone]   Fax:  [phone]

## 2014-07-14 ENCOUNTER — Ambulatory Visit: Payer: BC Managed Care – PPO

## 2014-07-14 ENCOUNTER — Ambulatory Visit: Payer: BC Managed Care – PPO | Attending: Physical Medicine & Rehabilitation

## 2014-07-14 ENCOUNTER — Telehealth: Payer: Self-pay | Admitting: *Deleted

## 2014-07-14 DIAGNOSIS — R262 Difficulty in walking, not elsewhere classified: Secondary | ICD-10-CM | POA: Diagnosis present

## 2014-07-14 DIAGNOSIS — R531 Weakness: Secondary | ICD-10-CM | POA: Insufficient documentation

## 2014-07-14 DIAGNOSIS — R269 Unspecified abnormalities of gait and mobility: Secondary | ICD-10-CM | POA: Diagnosis present

## 2014-07-14 DIAGNOSIS — R6889 Other general symptoms and signs: Secondary | ICD-10-CM

## 2014-07-14 DIAGNOSIS — N390 Urinary tract infection, site not specified: Secondary | ICD-10-CM

## 2014-07-14 NOTE — Telephone Encounter (Signed)
Received VM on triage; pt states she is having burning with urination with itching, no fever. Wants to know if a UA can be ordered to r/o UTI vs yeast infection secondary to cipro.  Pt has labs tomorrow w/ injection. UA order placed. Message routed to Dr. [name] and Val RN

## 2014-07-14 NOTE — Telephone Encounter (Signed)
Thank you :)

## 2014-07-14 NOTE — Therapy (Signed)
[facility] [address] [place], [place], [address] Phone: [phone]   Fax:  [phone]  Physical Therapy Treatment  Patient Details  Name: Carolyn [name] MRN: [id number] Date of Birth: 01/07/1971 Referring Provider:  [name], MD  Encounter Date: 07/14/2014      PT End of Session - 07/14/14 1144    Visit Number 11   Date for PT Re-Evaluation 07/21/14   PT Start Time 1110   PT Stop Time 1150   PT Time Calculation (min) 40 min      Past Medical History  Diagnosis Date  . 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 Hx of radiation therapy 11/08/11 -12/26/11    right chest wall/supraclav fossa, right scar  . History of chemotherapy 06/27/11 -09/04/11    neoadjuvant  . S/P radiation therapy 05/08/14    SRS brain    Past Surgical History  Procedure Laterality Date  . Wisdom tooth extraction    . Portacath placement  06/20/2011    Procedure: INSERTION PORT-A-CATH;  Surgeon: Haywood Lasso, MD;  Location: [place];  Service: General;  Laterality: N/A;  . Modified mastectomy  10/03/2011    Procedure: MODIFIED MASTECTOMY;  Surgeon: Haywood Lasso, MD;  Location: [place];  Service: General;  Laterality: Right;  . Port-a-cath removal  01/30/2012    Procedure: MINOR REMOVAL PORT-A-CATH;  Surgeon: Haywood Lasso, MD;  Location: [place];  Service: General;  Laterality: Left;  . Breast biopsy      Left  . Robotic assisted total hysterectomy with bilateral salpingo oopherectomy Bilateral 12/23/2012    Procedure: ROBOTIC ASSISTED TOTAL HYSTERECTOMY WITH BILATERAL SALPINGO OOPHORECTOMY;  Surgeon: Marvene Staff, MD;  Location: [place] [facility];  Service: Gynecology;  Laterality: Bilateral;  . Cholecystectomy N/A 03/01/2014    Procedure: LAPAROSCOPIC CHOLECYSTECTOMY;  Surgeon: Leighton Ruff, MD;  Location: [facility];  Service: General;   Laterality: N/A;  . Laminectomy N/A 04/13/2014    Procedure: THORACIC LAMINECTOMY WITH FIXATION THORACIC SIX-THORACIC TEN FUSION;  Surgeon: Kristeen Miss, MD;  Location: [place] [facility];  Service: Neurosurgery;  Laterality: N/A;    There were no vitals filed for this visit.  Visit Diagnosis:  Weakness generalized  Difficulty walking  Abnormality of gait  Activity intolerance      Subjective Assessment - 07/14/14 1109    Subjective Feeling fine since last time. States that home exercise program is feeling pretty good and pec stretch is helping.    How long can you stand comfortably? 20 minutes    How long can you walk comfortably? 20 minutes with the cane    Currently in Pain? No/denies                         [facility] Adult PT Treatment/Exercise - 07/14/14 0001    Ambulation/Gait   Ambulation/Gait Yes   Ambulation/Gait Assistance 6: Modified independent (Device/Increase time)  SPC    Gait Pattern Decreased step length - right;Decreased step length - left;Decreased stride length;Decreased dorsiflexion - right;Decreased dorsiflexion - left;Shuffle   Ambulation Surface Level   Gait Comments Mod VC to for heel strike pattern, knee extension with use of cane    Lumbar Exercises: Aerobic   Stationary Bike Bike L0 x 6 minutes    Tread Mill 5 minutes at 0.6 mph; CGA/supervision  Gait training with mirror; VC for longer stride/heel  strike   UBE (Upper Arm Bike) Level 1 sitting x 6 minutes (3/3)   Lumbar Exercises: Standing   Other Standing Lumbar Exercises SLS x 30 seconds, 2x each leg   Knee/Hip Exercises: Aerobic   Tread Mill 5 minutes    Knee/Hip Exercises: Standing   SLS 30 secondsx2; alternate legs   1 UE support at counter    Shoulder Exercises: Seated   Other Seated Exercises 3 way raises: 1# added 3x10 each  No weight added for abduction due to pec minor tightness                 PT Education - 07/14/14 1303    Education provided Yes   Education  Details Appropriate heel strike pattern   Person(s) Educated Patient   Methods Explanation;Demonstration   Comprehension Verbalized understanding;Returned demonstration          PT Short Term Goals - 07/09/14 1129    PT SHORT TERM GOAL #1   Title be independent in initial HEP           PT Long Term Goals - 07/14/14 1116    PT LONG TERM GOAL #1   Title be independent in advanced HEP  Pt reports performing HEP daily at home and that it is helping    Time 8   Period Weeks   Status Achieved   PT LONG TERM GOAL #3   Title demonstrate heel to toe gait pattern on the Lt with level surface gait at least 50% of the time  Continues to require moderate VC for appropriate heel toe pattern  with gait on level surface               Plan - 07/14/14 1303    Clinical Impression Statement Has continued tolerance today for more standing exercises without need for rest break. Practice heel strike/knee extension for fluid gait pattern; patient limited due to LE weakness and required moderate cueing. Will continue to benefit from skilled PT for general strengthening, gait/ambulation training.    Pt will benefit from skilled therapeutic intervention in order to improve on the following deficits Abnormal gait;Difficulty walking;Decreased endurance;Decreased activity tolerance;Decreased strength;Decreased balance   Rehab Potential Good   PT Frequency 2x / week   PT Duration 8 weeks   PT Treatment/Interventions ADLs/Self Care Home Management;Therapeutic activities;Patient/family education;Therapeutic exercise;Manual techniques;Gait training;Cryotherapy;Neuromuscular re-education;Electrical Stimulation;Functional mobility training;Energy conservation   PT Next Visit Plan Gait training with treadmill, leg press machine, balance exercises (SLS, tandem stance, tandem walking), SLS on the trampoline. Renewal next session.   Consulted and Agree with Plan of Care Patient        Problem  List Patient Active Problem List   Diagnosis Date Noted  . Oral thrush 06/23/2014  . Fever 06/17/2014  . Hypokalemia 06/17/2014  . Breast cancer metastasized to brain   . 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 Malnutrition of moderate degree 04/13/2014  . Myelopathy 04/12/2014  . Pathologic fracture of thoracic vertebrae 04/12/2014  . Biliary colic 00/86/7619  . Breast cancer of upper-outer quadrant of right female breast 09/24/2013  . S/P total hysterectomy and bilateral salpingo-oophorectomy 12/23/2012  . History of chemotherapy   . Hx of radiation therapy    Reginal Lutes, SPT 07/14/2014 2:07 PM  During this treatment session, the therapist was present, participating in, and directing the treatment. TAKACS,KELLY, PT 07/14/2014, 2:07 PM  [facility] [address] [place], [place], [address] Phone: [phone]   Fax:  [phone]      

## 2014-07-15 ENCOUNTER — Ambulatory Visit (HOSPITAL_BASED_OUTPATIENT_CLINIC_OR_DEPARTMENT_OTHER): Payer: BC Managed Care – PPO

## 2014-07-15 ENCOUNTER — Other Ambulatory Visit (HOSPITAL_BASED_OUTPATIENT_CLINIC_OR_DEPARTMENT_OTHER): Payer: BC Managed Care – PPO

## 2014-07-15 ENCOUNTER — Ambulatory Visit: Payer: BC Managed Care – PPO

## 2014-07-15 VITALS — BP 115/72 | HR 105 | Temp 98.7°F

## 2014-07-15 DIAGNOSIS — C7931 Secondary malignant neoplasm of brain: Secondary | ICD-10-CM

## 2014-07-15 DIAGNOSIS — C50411 Malignant neoplasm of upper-outer quadrant of right female breast: Secondary | ICD-10-CM

## 2014-07-15 DIAGNOSIS — C50811 Malignant neoplasm of overlapping sites of right female breast: Secondary | ICD-10-CM | POA: Diagnosis not present

## 2014-07-15 DIAGNOSIS — C7951 Secondary malignant neoplasm of bone: Secondary | ICD-10-CM

## 2014-07-15 DIAGNOSIS — Z5111 Encounter for antineoplastic chemotherapy: Secondary | ICD-10-CM

## 2014-07-15 DIAGNOSIS — N390 Urinary tract infection, site not specified: Secondary | ICD-10-CM

## 2014-07-15 DIAGNOSIS — C50919 Malignant neoplasm of unspecified site of unspecified female breast: Secondary | ICD-10-CM

## 2014-07-15 DIAGNOSIS — C773 Secondary and unspecified malignant neoplasm of axilla and upper limb lymph nodes: Secondary | ICD-10-CM

## 2014-07-15 LAB — URINALYSIS, MICROSCOPIC - CHCC
BLOOD: NEGATIVE
Bilirubin (Urine): NEGATIVE
Glucose: NEGATIVE mg/dL
Ketones: NEGATIVE mg/dL
Nitrite: NEGATIVE
PROTEIN: NEGATIVE mg/dL
SPECIFIC GRAVITY, URINE: 1.015 (ref 1.003–1.035)
UROBILINOGEN UR: 0.2 mg/dL (ref 0.2–1)
pH: 6 (ref 4.6–8.0)

## 2014-07-15 LAB — CBC WITH DIFFERENTIAL/PLATELET
BASO%: 2.1 % — AB (ref 0.0–2.0)
Basophils Absolute: 0 10*3/uL (ref 0.0–0.1)
EOS%: 6.7 % (ref 0.0–7.0)
Eosinophils Absolute: 0.1 10*3/uL (ref 0.0–0.5)
HCT: 28.7 % — ABNORMAL LOW (ref 34.8–46.6)
HGB: 9.7 g/dL — ABNORMAL LOW (ref 11.6–15.9)
LYMPH%: 21.6 % (ref 14.0–49.7)
MCH: 31.3 pg (ref 25.1–34.0)
MCHC: 33.9 g/dL (ref 31.5–36.0)
MCV: 92.2 fL (ref 79.5–101.0)
MONO#: 0.1 10*3/uL (ref 0.1–0.9)
MONO%: 5.5 % (ref 0.0–14.0)
NEUT#: 1.3 10*3/uL — ABNORMAL LOW (ref 1.5–6.5)
NEUT%: 64.1 % (ref 38.4–76.8)
PLATELETS: 285 10*3/uL (ref 145–400)
RBC: 3.12 10*6/uL — ABNORMAL LOW (ref 3.70–5.45)
RDW: 18.7 % — ABNORMAL HIGH (ref 11.2–14.5)
WBC: 2.1 10*3/uL — AB (ref 3.9–10.3)
lymph#: 0.5 10*3/uL — ABNORMAL LOW (ref 0.9–3.3)

## 2014-07-15 LAB — COMPREHENSIVE METABOLIC PANEL (CC13)
ALT: 8 U/L (ref 0–55)
AST: 13 U/L (ref 5–34)
Albumin: 3.3 g/dL — ABNORMAL LOW (ref 3.5–5.0)
Alkaline Phosphatase: 78 U/L (ref 40–150)
Anion Gap: 11 mEq/L (ref 3–11)
BUN: 11.3 mg/dL (ref 7.0–26.0)
CHLORIDE: 104 meq/L (ref 98–109)
CO2: 29 meq/L (ref 22–29)
Calcium: 9.8 mg/dL (ref 8.4–10.4)
Creatinine: 0.8 mg/dL (ref 0.6–1.1)
EGFR: >90 mL/min/{1.73_m2} (ref >90)
Glucose: 76 mg/dl (ref 70–140)
Potassium: 3.8 mEq/L (ref 3.5–5.1)
Sodium: 144 mEq/L (ref 136–145)
TOTAL PROTEIN: 7.1 g/dL (ref 6.4–8.3)
Total Bilirubin: 0.29 mg/dL (ref 0.20–1.20)

## 2014-07-15 MED ORDER — FULVESTRANT 250 MG/5ML IM SOLN
500.0000 mg | Freq: Once | INTRAMUSCULAR | Status: AC
  Administered 2014-07-15: 500 mg via INTRAMUSCULAR
  Filled 2014-07-15: qty 10

## 2014-07-16 ENCOUNTER — Ambulatory Visit: Payer: BC Managed Care – PPO

## 2014-07-16 DIAGNOSIS — R531 Weakness: Secondary | ICD-10-CM

## 2014-07-16 DIAGNOSIS — R6889 Other general symptoms and signs: Secondary | ICD-10-CM

## 2014-07-16 DIAGNOSIS — R269 Unspecified abnormalities of gait and mobility: Secondary | ICD-10-CM

## 2014-07-16 DIAGNOSIS — R262 Difficulty in walking, not elsewhere classified: Secondary | ICD-10-CM

## 2014-07-16 NOTE — Telephone Encounter (Signed)
U/A with in normal values.  Message left on number identified home phone for pt to return call for to discuss symptoms and possible need for diflucan for itching.

## 2014-07-16 NOTE — Therapy (Signed)
[facility] [address], [place], [place], [address] Phone: [phone]   Fax:  [phone]  Physical Therapy Treatment  Patient Details  Name: Carolyn [name] MRN: [id number] Date of Birth: 1970-11-13 Referring Provider:  Meredith Staggers, MD  Encounter Date: 07/16/2014      PT End of Session - 07/16/14 0945    Visit Number 12   Date for PT Re-Evaluation 08/28/14   PT Start Time 0850   PT Stop Time 0940   PT Time Calculation (min) 50 min   Equipment Utilized During Treatment Gait belt   Activity Tolerance Patient tolerated treatment well   Behavior During Therapy [facility] for tasks assessed/performed      Past Medical History  Diagnosis Date  . 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 Hx of radiation therapy 11/08/11 -12/26/11    right chest wall/supraclav fossa, right scar  . History of chemotherapy 06/27/11 -09/04/11    neoadjuvant  . S/P radiation therapy 05/08/14    SRS brain    Past Surgical History  Procedure Laterality Date  . Wisdom tooth extraction    . Portacath placement  06/20/2011    Procedure: INSERTION PORT-A-CATH;  Surgeon: Haywood Lasso, MD;  Location: [place];  Service: General;  Laterality: N/A;  . Modified mastectomy  10/03/2011    Procedure: MODIFIED MASTECTOMY;  Surgeon: Haywood Lasso, MD;  Location: [place];  Service: General;  Laterality: Right;  . Port-a-cath removal  01/30/2012    Procedure: MINOR REMOVAL PORT-A-CATH;  Surgeon: Haywood Lasso, MD;  Location: [place];  Service: General;  Laterality: Left;  . Breast biopsy      Left  . Robotic assisted total hysterectomy with bilateral salpingo oopherectomy Bilateral 12/23/2012    Procedure: ROBOTIC ASSISTED TOTAL HYSTERECTOMY WITH BILATERAL SALPINGO OOPHORECTOMY;  Surgeon: Marvene Staff, MD;  Location: [place] [facility];  Service: Gynecology;   Laterality: Bilateral;  . Cholecystectomy N/A 03/01/2014    Procedure: LAPAROSCOPIC CHOLECYSTECTOMY;  Surgeon: Leighton Ruff, MD;  [facility];  Service: General;  Laterality: N/A;  . Laminectomy N/A 04/13/2014    Procedure: THORACIC LAMINECTOMY WITH FIXATION THORACIC SIX-THORACIC TEN FUSION;  Surgeon: Kristeen Miss, MD;  Location: [place] [facility];  Service: Neurosurgery;  Laterality: N/A;    There were no vitals filed for this visit.  Visit Diagnosis:  Weakness generalized - Plan: PT plan of care cert/re-cert  Abnormality of gait - Plan: PT plan of care cert/re-cert  Difficulty walking - Plan: PT plan of care cert/re-cert  Activity intolerance - Plan: PT plan of care cert/re-cert      Subjective Assessment - 07/16/14 0855    Subjective Feeling good since last time.  Working on gait pattern both in home/community.    Patient Stated Goals Increase strength in legs, walk without cane, improve shoulder strength   Currently in Pain? 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, myelopathy (G99.@)   Onset Date/Surgical Date 04/13/14  cancer diagnosed 04/12/14   Strength   Overall Strength Deficits   Overall Strength Comments Bil hip flexion: 4/5; bil DF: 4+/5                      [facility] Adult  PT Treatment/Exercise - 07/16/14 0001    Ambulation/Gait   Ambulation/Gait Yes   Ambulation/Gait Assistance 6: Modified independent (Device/Increase time)  CGA to supervision    Gait Comments Agility ladder; 6x forward walking without cane  4x backward walking with cane   Exercises   Exercises Ankle   Lumbar Exercises: Aerobic   UBE (Upper Arm Bike) Level 1 sitting x 6 minutes (3/3)   Knee/Hip Exercises: Aerobic   Stationary Bike --   Knee/Hip Exercises: Machines for Strengthening   Cybex Leg Press 3x7; 50#, rest break in between sets   seat 6    Knee/Hip  Exercises: Standing   Other Standing Knee Exercises lateral steps 10 ft x 4  3x10    Knee/Hip Exercises: Seated   Long Arc Quad Strengthening;Both;10 reps;3 sets   Long Arc Quad Weight 4 lbs.   Other Seated Knee/Hip Exercises marching 3 lbs weight; 1 minx3    Ankle Exercises: Seated   Other Seated Ankle Exercises DF 10x2; red theraband  provided for HEP                 PT Education - 07/16/14 0945    Education provided Yes   Education Details ankle DF exercises    Person(s) Educated Patient   Methods Explanation;Demonstration   Comprehension Verbalized understanding;Returned demonstration          PT Short Term Goals - 07/16/14 0949    PT SHORT TERM GOAL #1   Title be independent in initial HEP   Time 4   Period Weeks   Status Achieved   PT SHORT TERM GOAL #2   Title improve strength and safety to allow to wean from walker to cane 50% of the time in the home  Uses cane 100% of the time for ambulation    Time 4   Period Weeks   Status Achieved   PT SHORT TERM GOAL #3   Title walk for 25 minutes with walker without significant fatigue   Time 4   Period Weeks   Status Achieved           PT Long Term Goals - 07/16/14 0950    PT LONG TERM GOAL #1   Title be independent in advanced HEP   Time 6   Period Weeks   Status On-going  Continued advancement over the next weeks is needed   PT LONG TERM GOAL #2   Title wean from walker to use of cane at least 50% in the community   Time --   Period --   Status Achieved   PT LONG TERM GOAL #3   Title demonstrate heel to toe gait pattern on the Lt with level surface gait at least 50% of the time  Only needs min cueing for heel to toe gait pattern when fatigued in clinic; performs heel toe with cane.    Time 6   Period Weeks   Status Partially Met   PT LONG TERM GOAL #4   Title improve endurance to walk for 25 minutes with use of cane in the community   Time --   Period --   Status Achieved   PT LONG TERM  GOAL #5   Title demonstrate 4+/5 Lt hip flexor and DF strength to improve safety with gait  4/5 bil hip flexion; 4+/5 bil DF strength    Time 6   Period Weeks   Status Partially Met   Additional Long Term Goals   Additional Long Term Goals  Yes   PT LONG TERM GOAL #6   Title Wean from cane use at least 50% of the time for community ambulation    Time 6   Period Weeks   Status New               Plan - 07/16/14 0946    Clinical Impression Statement Able to perform heel strike pattern without cueing when using cane; required min cueing for narrow BOS when ambulating without the cane. Able to perform appropriate gait pattern with practice, but is fatigued quickly. Strength has improved since initial evaluation, but still needed for proper gait mechanics and endurance. Will benefit from skilled PT for continued endurance, UE/LE strengthening and balance.    Pt will benefit from skilled therapeutic intervention in order to improve on the following deficits Abnormal gait;Difficulty walking;Decreased endurance;Decreased activity tolerance;Decreased strength;Decreased balance   Rehab Potential Good   PT Frequency 2x / week   PT Duration 6 weeks   PT Treatment/Interventions ADLs/Self Care Home Management;Therapeutic activities;Patient/family education;Therapeutic exercise;Manual techniques;Gait training;Cryotherapy;Neuromuscular re-education;Electrical Stimulation;Functional mobility training;Energy conservation   PT Next Visit Plan Continue with treadmill training, balance exercises (standing tandem/SLS) , tandem walking, check quad strength    Consulted and Agree with Plan of Care Patient        Problem List Patient Active Problem List   Diagnosis Date Noted  . Oral thrush 06/23/2014  . Fever 06/17/2014  . Hypokalemia 06/17/2014  . Breast cancer metastasized to brain   . 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 Malnutrition of moderate degree 04/13/2014  . Myelopathy 04/12/2014   . Pathologic fracture of thoracic vertebrae 04/12/2014  . Biliary colic 53/91/2258  . Breast cancer of upper-outer quadrant of right female breast 09/24/2013  . S/P total hysterectomy and bilateral salpingo-oophorectomy 12/23/2012  . History of chemotherapy   . Hx of radiation therapy    Reginal Lutes, SPT 07/16/2014 10:16 AM   During this treatment session, the therapist was present, participating in, and directing the treatment. TAKACS,KELLY, PT 07/16/2014, 10:16 AM  [facility] [address], [place], [place], [address] Phone: [phone]   Fax:  [phone]

## 2014-07-17 ENCOUNTER — Other Ambulatory Visit: Payer: Self-pay

## 2014-07-17 DIAGNOSIS — Z1231 Encounter for screening mammogram for malignant neoplasm of breast: Secondary | ICD-10-CM

## 2014-07-20 ENCOUNTER — Ambulatory Visit: Payer: BC Managed Care – PPO | Admitting: Physical Therapy

## 2014-07-20 ENCOUNTER — Encounter: Payer: Self-pay | Admitting: Physical Therapy

## 2014-07-20 DIAGNOSIS — R531 Weakness: Secondary | ICD-10-CM

## 2014-07-20 DIAGNOSIS — R262 Difficulty in walking, not elsewhere classified: Secondary | ICD-10-CM

## 2014-07-20 DIAGNOSIS — R269 Unspecified abnormalities of gait and mobility: Secondary | ICD-10-CM

## 2014-07-20 DIAGNOSIS — R6889 Other general symptoms and signs: Secondary | ICD-10-CM

## 2014-07-20 NOTE — Therapy (Signed)
[facility] [address] [place], [place], [address] Phone: [phone]   Fax:  [phone]  Physical Therapy Treatment  Patient Details  Name: Carolyn [name] MRN: [id number] Date of Birth: 1970/04/01 Referring Provider:  Meredith Staggers, MD  Encounter Date: 07/20/2014      PT End of Session - 07/20/14 1032    Visit Number 13   Date for PT Re-Evaluation 08/28/14   PT Start Time 1014   PT Stop Time 1100   PT Time Calculation (min) 46 min   Equipment Utilized During Treatment Gait belt   Activity Tolerance Patient tolerated treatment well   Behavior During Therapy [facility] for tasks assessed/performed      Past Medical History  Diagnosis Date  . 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 Hx of radiation therapy 11/08/11 -12/26/11    right chest wall/supraclav fossa, right scar  . History of chemotherapy 06/27/11 -09/04/11    neoadjuvant  . S/P radiation therapy 05/08/14    SRS brain    Past Surgical History  Procedure Laterality Date  . Wisdom tooth extraction    . Portacath placement  06/20/2011    Procedure: INSERTION PORT-A-CATH;  Surgeon: Haywood Lasso, MD;  Location: [place];  Service: General;  Laterality: N/A;  . Modified mastectomy  10/03/2011    Procedure: MODIFIED MASTECTOMY;  Surgeon: Haywood Lasso, MD;  Location: [place];  Service: General;  Laterality: Right;  . Port-a-cath removal  01/30/2012    Procedure: MINOR REMOVAL PORT-A-CATH;  Surgeon: Haywood Lasso, MD;  Location: [place];  Service: General;  Laterality: Left;  . Breast biopsy      Left  . Robotic assisted total hysterectomy with bilateral salpingo oopherectomy Bilateral 12/23/2012    Procedure: ROBOTIC ASSISTED TOTAL HYSTERECTOMY WITH BILATERAL SALPINGO OOPHORECTOMY;  Surgeon: Marvene Staff, MD;  Location: [facility] [facility];  Service: Gynecology;   Laterality: Bilateral;  . Cholecystectomy N/A 03/01/2014    Procedure: LAPAROSCOPIC CHOLECYSTECTOMY;  Surgeon: Leighton Ruff, MD;  [facility];  Service: General;  Laterality: N/A;  . Laminectomy N/A 04/13/2014    Procedure: THORACIC LAMINECTOMY WITH FIXATION THORACIC SIX-THORACIC TEN FUSION;  Surgeon: Kristeen Miss, MD;  Location: [place] [facility];  Service: Neurosurgery;  Laterality: N/A;    There were no vitals filed for this visit.  Visit Diagnosis:  Weakness generalized  Abnormality of gait  Difficulty walking  Activity intolerance      Subjective Assessment - 07/20/14 1030    Subjective feeling good, notice improvement with ambulation, using cane for all ambulation. Pt has MD appointment with Dr Ellene Route   Currently in Pain? No/denies   Multiple Pain Sites No                         [facility] Adult PT Treatment/Exercise - 07/20/14 0001    Ambulation/Gait   Ambulation/Gait Yes   Ambulation/Gait Assistance 6: Modified independent (Device/Increase time)  practice distance neede in gym without cane but close Superv   High Level Balance   High Level Balance Activities Tandem walking  along kitchen counter x 2 each direction with 1 UE support   High Level Balance Comments Tandme stance x 2 with 20 sec hold each leg with UE support at times  and close Supervision   Lumbar Exercises: Aerobic   Stationary Bike Bike L1 x 8 minutes  Elliptical L3/ R3 x 3 min    UBE (Upper Arm Bike) Level 1 sitting x 6 minutes (3/3)   Lumbar Exercises: Standing   Other Standing Lumbar Exercises SLS x 30 seconds, 2x each leg  with modification at times   Knee/Hip Exercises: Machines for Strengthening   Cybex Leg Press St6 55#, 3 x10 B LE rest break in between sets    Knee/Hip Exercises: Seated   Long Arc Quad Strengthening;Both;10 reps;3 sets   [employer] Weight 4 lbs.                  PT Short Term Goals - 07/20/14 1044    PT SHORT TERM GOAL #1   Title be  independent in initial HEP   Time 4   Period Weeks   Status Achieved   PT SHORT TERM GOAL #2   Title improve strength and safety to allow to wean from walker to cane 50% of the time in the home   Time 4   Period Weeks   Status Achieved   PT SHORT TERM GOAL #3   Title walk for 25 minutes with walker without significant fatigue   Time 4   Period Weeks   Status Achieved           PT Long Term Goals - 07/20/14 1045    PT LONG TERM GOAL #1   Title be independent in advanced HEP   Time 6   Period Weeks   Status On-going   PT LONG TERM GOAL #2   Title wean from walker to use of cane at least 50% in the community   Time 8   Period Weeks   Status Achieved   PT LONG TERM GOAL #3   Title demonstrate heel to toe gait pattern on the Lt with level surface gait at least 50% of the time   Time 6   Period Weeks   Status Partially Met   PT LONG TERM GOAL #4   Title improve endurance to walk for 25 minutes with use of cane in the community   Time 8   Period Weeks   Status Achieved   PT LONG TERM GOAL #5   Title demonstrate 4+/5 Lt hip flexor and DF strength to improve safety with gait   Time 6   Period Weeks   Status Partially Met   PT LONG TERM GOAL #6   Title Wean from cane use at least 50% of the time for community ambulation    Time 6   Period Weeks   Status On-going               Plan - 07/20/14 1038    Pt will benefit from skilled therapeutic intervention in order to improve on the following deficits Abnormal gait;Difficulty walking;Decreased endurance;Decreased activity tolerance;Decreased strength;Decreased balance   Rehab Potential Good   PT Frequency 2x / week   PT Duration 6 weeks   PT Treatment/Interventions ADLs/Self Care Home Management;Therapeutic activities;Patient/family education;Therapeutic exercise;Manual techniques;Gait training;Cryotherapy;Neuromuscular re-education;Electrical Stimulation;Functional mobility training;Energy conservation   PT Next  Visit Plan Continue with treadmill training, balance exercises (standing tandem/SLS) , tandem walking   Consulted and Agree with Plan of Care Patient        Problem List Patient Active Problem List   Diagnosis Date Noted  . Oral thrush 06/23/2014  . Fever 06/17/2014  . Hypokalemia 06/17/2014  . Breast cancer metastasized to brain   . 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 Malnutrition  of moderate degree 04/13/2014  . Myelopathy 04/12/2014  . Pathologic fracture of thoracic vertebrae 04/12/2014  . Biliary colic 63/14/9702  . Breast cancer of upper-outer quadrant of right female breast 09/24/2013  . S/P total hysterectomy and bilateral salpingo-oophorectomy 12/23/2012  . History of chemotherapy   . Hx of radiation therapy     NAUMANN-HOUEGNIFIO,Yael Angerer PTA 07/20/2014, 11:10 AM  [facility] [address], [place], [place], [address] Phone: [phone]   Fax:  [phone]

## 2014-07-21 ENCOUNTER — Encounter: Payer: Self-pay | Admitting: Nurse Practitioner

## 2014-07-21 NOTE — Progress Notes (Signed)
I placed [employer] forms on desk of heather B

## 2014-07-21 NOTE — Progress Notes (Signed)
I placed forms for patient pick up at front desk with ms. Wilma. I called to let the patient know they are there for her pick up.

## 2014-07-22 ENCOUNTER — Other Ambulatory Visit (HOSPITAL_BASED_OUTPATIENT_CLINIC_OR_DEPARTMENT_OTHER): Payer: BC Managed Care – PPO

## 2014-07-22 DIAGNOSIS — C50811 Malignant neoplasm of overlapping sites of right female breast: Secondary | ICD-10-CM | POA: Diagnosis not present

## 2014-07-22 DIAGNOSIS — C50411 Malignant neoplasm of upper-outer quadrant of right female breast: Secondary | ICD-10-CM

## 2014-07-22 LAB — CBC WITH DIFFERENTIAL/PLATELET
BASO%: 1.6 % (ref 0.0–2.0)
Basophils Absolute: 0 10*3/uL (ref 0.0–0.1)
EOS%: 6.7 % (ref 0.0–7.0)
Eosinophils Absolute: 0.1 10*3/uL (ref 0.0–0.5)
HEMATOCRIT: 29.8 % — AB (ref 34.8–46.6)
HEMOGLOBIN: 9.9 g/dL — AB (ref 11.6–15.9)
LYMPH%: 25.3 % (ref 14.0–49.7)
MCH: 31.9 pg (ref 25.1–34.0)
MCHC: 33.4 g/dL (ref 31.5–36.0)
MCV: 95.4 fL (ref 79.5–101.0)
MONO#: 0.1 10*3/uL (ref 0.1–0.9)
MONO%: 7 % (ref 0.0–14.0)
NEUT#: 0.9 10*3/uL — ABNORMAL LOW (ref 1.5–6.5)
NEUT%: 59.4 % (ref 38.4–76.8)
Platelets: 140 10*3/uL — ABNORMAL LOW (ref 145–400)
RBC: 3.12 10*6/uL — ABNORMAL LOW (ref 3.70–5.45)
RDW: 20.7 % — AB (ref 11.2–14.5)
WBC: 1.5 10*3/uL — AB (ref 3.9–10.3)
lymph#: 0.4 10*3/uL — ABNORMAL LOW (ref 0.9–3.3)

## 2014-07-22 LAB — COMPREHENSIVE METABOLIC PANEL (CC13)
ALT: 9 U/L (ref 0–55)
AST: 13 U/L (ref 5–34)
Albumin: 3.5 g/dL (ref 3.5–5.0)
Alkaline Phosphatase: 78 U/L (ref 40–150)
Anion Gap: 8 mEq/L (ref 3–11)
BILIRUBIN TOTAL: 0.35 mg/dL (ref 0.20–1.20)
BUN: 12.2 mg/dL (ref 7.0–26.0)
CO2: 29 mEq/L (ref 22–29)
Calcium: 9.7 mg/dL (ref 8.4–10.4)
Chloride: 106 mEq/L (ref 98–109)
Creatinine: 0.8 mg/dL (ref 0.6–1.1)
EGFR: >90 mL/min/{1.73_m2} (ref >90)
Glucose: 85 mg/dl (ref 70–140)
Potassium: 3.9 mEq/L (ref 3.5–5.1)
SODIUM: 143 meq/L (ref 136–145)
TOTAL PROTEIN: 7.2 g/dL (ref 6.4–8.3)

## 2014-07-23 ENCOUNTER — Ambulatory Visit: Payer: BC Managed Care – PPO

## 2014-07-23 DIAGNOSIS — R531 Weakness: Secondary | ICD-10-CM

## 2014-07-23 DIAGNOSIS — R262 Difficulty in walking, not elsewhere classified: Secondary | ICD-10-CM

## 2014-07-23 DIAGNOSIS — R6889 Other general symptoms and signs: Secondary | ICD-10-CM

## 2014-07-23 DIAGNOSIS — R269 Unspecified abnormalities of gait and mobility: Secondary | ICD-10-CM

## 2014-07-23 NOTE — Therapy (Signed)
[facility] [address], [place], [place], [address] Phone: [phone]   Fax:  [phone]  Physical Therapy Treatment  Patient Details  Name: Carolyn [name] MRN: [id number] Date of Birth: [birth year] Referring Provider:  [name], MD  Encounter Date: 07/23/2014      PT End of Session - 07/23/14 0853    Visit Number 14   Date for PT Re-Evaluation 08/28/14   PT Start Time 0850   PT Stop Time 0930   PT Time Calculation (min) 40 min   Equipment Utilized During Treatment Gait belt   Activity Tolerance Patient tolerated treatment well   Behavior During Therapy [facility] for tasks assessed/performed      Past Medical History  Diagnosis Date  . 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 Hx of radiation therapy 11/08/11 -12/26/11    right chest wall/supraclav fossa, right scar  . History of chemotherapy 06/27/11 -09/04/11    neoadjuvant  . S/P radiation therapy 05/08/14    SRS brain    Past Surgical History  Procedure Laterality Date  . Wisdom tooth extraction    . Portacath placement  06/20/2011    Procedure: INSERTION PORT-A-CATH;  Surgeon: Haywood Lasso, MD;  Location: [place];  Service: General;  Laterality: N/A;  . Modified mastectomy  10/03/2011    Procedure: MODIFIED MASTECTOMY;  Surgeon: Haywood Lasso, MD;  Location: [place];  Service: General;  Laterality: Right;  . Port-a-cath removal  01/30/2012    Procedure: MINOR REMOVAL PORT-A-CATH;  Surgeon: Haywood Lasso, MD;  Location: [place];  Service: General;  Laterality: Left;  . Breast biopsy      Left  . Robotic assisted total hysterectomy with bilateral salpingo oopherectomy Bilateral 12/23/2012    Procedure: ROBOTIC ASSISTED TOTAL HYSTERECTOMY WITH BILATERAL SALPINGO OOPHORECTOMY;  Surgeon: Marvene Staff, MD;  Location: [place] [facility];  Service: Gynecology;  Laterality:  Bilateral;  . Cholecystectomy N/A 03/01/2014    Procedure: LAPAROSCOPIC CHOLECYSTECTOMY;  Surgeon: Leighton Ruff, MD;  Location: [facility];  Service: General;  Laterality: N/A;  . Laminectomy N/A 04/13/2014    Procedure: THORACIC LAMINECTOMY WITH FIXATION THORACIC SIX-THORACIC TEN FUSION;  Surgeon: Kristeen Miss, MD;  Location: [facility];  Service: Neurosurgery;  Laterality: N/A;    There were no vitals filed for this visit.  Visit Diagnosis:  Weakness generalized  Difficulty walking  Activity intolerance  Abnormality of gait      Subjective Assessment - 07/23/14 0854    Subjective Continues with exercise program at home. Reports feeling good with walking.    How long can you walk comfortably? 20 minutes    Currently in Pain? No/denies                         [facility] Adult PT Treatment/Exercise - 07/23/14 0001    Ambulation/Gait   Ambulation/Gait Yes   Ambulation/Gait Assistance 6: Modified independent (Device/Increase time)  Without cane, supervision required    Ambulation Distance (Feet) 60 Feet  60 ft. x 5; seated rest break between each set   Gait Comments Min cueing for larger steps, 3 LOB due to decreased balance and bilateral weakness in quads    Lumbar Exercises: Aerobic   Stationary Bike Bike L2, 5 min   Felt like a workout    Knee/Hip Exercises: Machines for Strengthening   Cybex Leg Press St6  55#, 3 x10 B LE rest break in between sets    Knee/Hip Exercises: Standing   Forward Step Up --   Other Standing Knee Exercises standing hip extension/abduction 3x10    Shoulder Exercises: Seated   Other Seated Exercises 3 way raises: 1# added 3x10 each  No weight with abduction    Shoulder Exercises: ROM/Strengthening   Other ROM/Strengthening Exercises walking with sports cord; backward with 15#, 10# forward   7x each direction                   PT Short Term Goals - 07/20/14 1044    PT SHORT TERM GOAL #1   Title be independent in initial HEP    Time 4   Period Weeks   Status Achieved   PT SHORT TERM GOAL #2   Title improve strength and safety to allow to wean from walker to cane 50% of the time in the home   Time 4   Period Weeks   Status Achieved   PT SHORT TERM GOAL #3   Title walk for 25 minutes with walker without significant fatigue   Time 4   Period Weeks   Status Achieved           PT Long Term Goals - 07/20/14 1045    PT LONG TERM GOAL #1   Title be independent in advanced HEP   Time 6   Period Weeks   Status On-going   PT LONG TERM GOAL #2   Title wean from walker to use of cane at least 50% in the community   Time 8   Period Weeks   Status Achieved   PT LONG TERM GOAL #3   Title demonstrate heel to toe gait pattern on the Lt with level surface gait at least 50% of the time   Time 6   Period Weeks   Status Partially Met   PT LONG TERM GOAL #4   Title improve endurance to walk for 25 minutes with use of cane in the community   Time 8   Period Weeks   Status Achieved   PT LONG TERM GOAL #5   Title demonstrate 4+/5 Lt hip flexor and DF strength to improve safety with gait   Time 6   Period Weeks   Status Partially Met   PT LONG TERM GOAL #6   Title Wean from cane use at least 50% of the time for community ambulation    Time 6   Period Weeks   Status On-going               Plan - 07/23/14 0935    Clinical Impression Statement Continues to demonstrate balance deficits and decreased quad/LE strength bilaterally when performing ambulation. Increased difficulty with forward walking with weights, but improved performance with practice. Will benefit  from continued PT for body mechanics to protect spine, balance training and LE strengthening.    Pt will benefit from skilled therapeutic intervention in order to improve on the following deficits Abnormal gait;Difficulty walking;Decreased endurance;Decreased activity tolerance;Decreased strength;Decreased balance   Rehab Potential Good   PT  Frequency 2x / week   PT Duration 6 weeks   PT Treatment/Interventions ADLs/Self Care Home Management;Therapeutic activities;Patient/family education;Therapeutic exercise;Manual techniques;Gait training;Cryotherapy;Neuromuscular re-education;Electrical Stimulation;Functional mobility training;Energy conservation   PT Next Visit Plan Practice ambulation without cane, LAQ/quad strengthening exercises, static/dynamic balance exercises    Consulted and Agree with Plan of Care Patient        Problem  List Patient Active Problem List   Diagnosis Date Noted  . Oral thrush 06/23/2014  . Fever 06/17/2014  . Hypokalemia 06/17/2014  . Breast cancer metastasized to brain   . 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 Malnutrition of moderate degree 04/13/2014  . Myelopathy 04/12/2014  . Pathologic fracture of thoracic vertebrae 04/12/2014  . Biliary colic 09/73/5329  . Breast cancer of upper-outer quadrant of right female breast 09/24/2013  . S/P total hysterectomy and bilateral salpingo-oophorectomy 12/23/2012  . History of chemotherapy   . Hx of radiation therapy    Reginal Lutes, SPT 07/23/2014 9:56 AM During this treatment session, the therapist was present, participating in, and directing the treatment. TAKACS,KELLY, PT 07/23/2014, 9:56 AM  [facility] [address], [place], [place], [address] Phone: [phone]   Fax:  [phone]

## 2014-07-24 ENCOUNTER — Ambulatory Visit: Payer: BC Managed Care – PPO | Admitting: Radiation Oncology

## 2014-07-27 ENCOUNTER — Ambulatory Visit: Payer: BC Managed Care – PPO | Admitting: Physical Therapy

## 2014-07-27 ENCOUNTER — Encounter: Payer: Self-pay | Admitting: Physical Therapy

## 2014-07-27 DIAGNOSIS — R6889 Other general symptoms and signs: Secondary | ICD-10-CM

## 2014-07-27 DIAGNOSIS — R531 Weakness: Secondary | ICD-10-CM | POA: Diagnosis not present

## 2014-07-27 DIAGNOSIS — R262 Difficulty in walking, not elsewhere classified: Secondary | ICD-10-CM

## 2014-07-27 DIAGNOSIS — R269 Unspecified abnormalities of gait and mobility: Secondary | ICD-10-CM

## 2014-07-27 NOTE — Therapy (Signed)
[facility] [address] [place], [place], [address] Phone: [phone]   Fax:  [phone]  Physical Therapy Treatment  Patient Details  Name: Carolyn [name] MRN: [id number] Date of Birth: May 25, 1970 Referring Provider:  [name], MD  Encounter Date: 07/27/2014      PT End of Session - 07/27/14 1455    Visit Number 15   Date for PT Re-Evaluation 08/28/14   PT Start Time 1451   Activity Tolerance Patient tolerated treatment well   Behavior During Therapy [facility] for tasks assessed/performed      Past Medical History  Diagnosis Date  . 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 Hx of radiation therapy 11/08/11 -12/26/11    right chest wall/supraclav fossa, right scar  . History of chemotherapy 06/27/11 -09/04/11    neoadjuvant  . S/P radiation therapy 05/08/14    SRS brain    Past Surgical History  Procedure Laterality Date  . Wisdom tooth extraction    . Portacath placement  06/20/2011    Procedure: INSERTION PORT-A-CATH;  Surgeon: Haywood Lasso, MD;  Location: [place];  Service: General;  Laterality: N/A;  . Modified mastectomy  10/03/2011    Procedure: MODIFIED MASTECTOMY;  Surgeon: Haywood Lasso, MD;  Location: [place];  Service: General;  Laterality: Right;  . Port-a-cath removal  01/30/2012    Procedure: MINOR REMOVAL PORT-A-CATH;  Surgeon: Haywood Lasso, MD;  Location: [place];  Service: General;  Laterality: Left;  . Breast biopsy      Left  . Robotic assisted total hysterectomy with bilateral salpingo oopherectomy Bilateral 12/23/2012    Procedure: ROBOTIC ASSISTED TOTAL HYSTERECTOMY WITH BILATERAL SALPINGO OOPHORECTOMY;  Surgeon: Marvene Staff, MD;  Location: [place] [facility];  Service: Gynecology;  Laterality: Bilateral;  . Cholecystectomy N/A 03/01/2014    Procedure: LAPAROSCOPIC CHOLECYSTECTOMY;  Surgeon:  Leighton Ruff, MD;  Location[facility];  Service: General;  Laterality: N/A;  . Laminectomy N/A 04/13/2014    Procedure: THORACIC LAMINECTOMY WITH FIXATION THORACIC SIX-THORACIC TEN FUSION;  Surgeon: Kristeen Miss, MD;  Location: [place] [facility];  Service: Neurosurgery;  Laterality: N/A;    There were no vitals filed for this visit.  Visit Diagnosis:  Difficulty walking  Activity intolerance  Weakness generalized  Abnormality of gait      Subjective Assessment - 07/27/14 1454    Subjective Continues to use the gym at appartment complex and walks in the morning.    Currently in Pain? No/denies   Multiple Pain Sites No                         [facility] Adult PT Treatment/Exercise - 07/27/14 0001    Ambulation/Gait   Ambulation/Gait Yes   Ambulation/Gait Assistance 6: Modified independent (Device/Increase time)   Ambulation Distance (Feet) 60 Feet  x 8 no sitting restbreaks, but at end unsteady gait    Gait Comments cues to incr pace    Lumbar Exercises: Aerobic   Stationary Bike Bike L2, 6 min    Knee/Hip Exercises: Standing   Other Standing Knee Exercises standing hip extension/abduction 3x10   2# added 2 x 10 each                   PT Short Term Goals - 07/27/14 1458    PT SHORT TERM GOAL #1   Title be independent  in initial HEP   Time 4   Period Weeks   Status Achieved   PT SHORT TERM GOAL #2   Title improve strength and safety to allow to wean from walker to cane 50% of the time in the home   Time 4   Period Weeks   Status Achieved   PT SHORT TERM GOAL #3   Title walk for 25 minutes with walker without significant fatigue   Time 4   Period Weeks   Status Achieved           PT Long Term Goals - 07/27/14 1500    PT LONG TERM GOAL #1   Title be independent in advanced HEP   Time 6   Period Weeks   Status On-going   PT LONG TERM GOAL #2   Title wean from walker to use of cane at least 50% in the community   Time 8   Period Weeks    Status Achieved   PT LONG TERM GOAL #3   Title demonstrate heel to toe gait pattern on the Lt with level surface gait at least 50% of the time   Time 6   Period Weeks   Status Partially Met   PT LONG TERM GOAL #4   Title improve endurance to walk for 25 minutes with use of cane in the community   Time 8   Period Weeks   Status Achieved   PT LONG TERM GOAL #5   Title demonstrate 4+/5 Lt hip flexor and DF strength to improve safety with gait   Time 6   Period Weeks   Status Partially Met   PT LONG TERM GOAL #6   Title Wean from cane use at least 50% of the time for community ambulation    Time 6   Period Weeks   Status On-going               Plan - 07/27/14 1456    Clinical Impression Statement Pt continues to demonstrate balance deficits and decreased quad/LE strength bilateralyy with ambualtion. Pt will continue to benefit from skilled PT to address weakness and improve gait.   Pt will benefit from skilled therapeutic intervention in order to improve on the following deficits Abnormal gait;Difficulty walking;Decreased endurance;Decreased activity tolerance;Decreased strength;Decreased balance   Rehab Potential Good   PT Frequency 2x / week   PT Duration 8 weeks   PT Treatment/Interventions ADLs/Self Care Home Management;Therapeutic activities;Patient/family education;Therapeutic exercise;Manual techniques;Gait training;Cryotherapy;Neuromuscular re-education;Electrical Stimulation;Functional mobility training;Energy conservation   PT Next Visit Plan Continue to practice without cane, LAQ strength. balance activities   Consulted and Agree with Plan of Care Patient        Problem List Patient Active Problem List   Diagnosis Date Noted  . Oral thrush 06/23/2014  . Fever 06/17/2014  . Hypokalemia 06/17/2014  . Breast cancer metastasized to brain   . 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 Malnutrition of moderate degree 04/13/2014  . Myelopathy 04/12/2014  .  Pathologic fracture of thoracic vertebrae 04/12/2014  . Biliary colic 82/50/0370  . Breast cancer of upper-outer quadrant of right female breast 09/24/2013  . S/P total hysterectomy and bilateral salpingo-oophorectomy 12/23/2012  . History of chemotherapy   . Hx of radiation therapy     NAUMANN-HOUEGNIFIO,Jeison Delpilar PTA 07/27/2014, 3:28 PM  [facility] [address] [place], [place], [address] Phone: [phone]   Fax:  [phone]

## 2014-07-29 ENCOUNTER — Other Ambulatory Visit (HOSPITAL_BASED_OUTPATIENT_CLINIC_OR_DEPARTMENT_OTHER): Payer: BC Managed Care – PPO

## 2014-07-29 ENCOUNTER — Ambulatory Visit (HOSPITAL_BASED_OUTPATIENT_CLINIC_OR_DEPARTMENT_OTHER): Payer: BC Managed Care – PPO | Admitting: Oncology

## 2014-07-29 ENCOUNTER — Telehealth: Payer: Self-pay | Admitting: Oncology

## 2014-07-29 VITALS — BP 121/71 | HR 100 | Temp 99.1°F | Resp 18 | Ht 68.0 in | Wt 129.5 lb

## 2014-07-29 DIAGNOSIS — C50811 Malignant neoplasm of overlapping sites of right female breast: Secondary | ICD-10-CM

## 2014-07-29 DIAGNOSIS — C7951 Secondary malignant neoplasm of bone: Secondary | ICD-10-CM | POA: Diagnosis not present

## 2014-07-29 DIAGNOSIS — C50919 Malignant neoplasm of unspecified site of unspecified female breast: Secondary | ICD-10-CM

## 2014-07-29 DIAGNOSIS — R918 Other nonspecific abnormal finding of lung field: Secondary | ICD-10-CM

## 2014-07-29 DIAGNOSIS — C7931 Secondary malignant neoplasm of brain: Secondary | ICD-10-CM

## 2014-07-29 DIAGNOSIS — C50411 Malignant neoplasm of upper-outer quadrant of right female breast: Secondary | ICD-10-CM

## 2014-07-29 DIAGNOSIS — C773 Secondary and unspecified malignant neoplasm of axilla and upper limb lymph nodes: Secondary | ICD-10-CM

## 2014-07-29 DIAGNOSIS — K769 Liver disease, unspecified: Secondary | ICD-10-CM

## 2014-07-29 DIAGNOSIS — Z170 Estrogen receptor positive status [ER+]: Secondary | ICD-10-CM

## 2014-07-29 LAB — COMPREHENSIVE METABOLIC PANEL (CC13)
ALBUMIN: 3.8 g/dL (ref 3.5–5.0)
ALT: 8 U/L (ref 0–55)
AST: 15 U/L (ref 5–34)
Alkaline Phosphatase: 83 U/L (ref 40–150)
Anion Gap: 8 mEq/L (ref 3–11)
BUN: 10.6 mg/dL (ref 7.0–26.0)
CALCIUM: 9.8 mg/dL (ref 8.4–10.4)
CHLORIDE: 105 meq/L (ref 98–109)
CO2: 28 meq/L (ref 22–29)
Creatinine: 0.8 mg/dL (ref 0.6–1.1)
Glucose: 85 mg/dl (ref 70–140)
Potassium: 4.1 mEq/L (ref 3.5–5.1)
SODIUM: 142 meq/L (ref 136–145)
TOTAL PROTEIN: 7.6 g/dL (ref 6.4–8.3)
Total Bilirubin: 0.32 mg/dL (ref 0.20–1.20)

## 2014-07-29 LAB — CBC WITH DIFFERENTIAL/PLATELET
BASO%: 0.9 % (ref 0.0–2.0)
BASOS ABS: 0 10*3/uL (ref 0.0–0.1)
EOS ABS: 0.1 10*3/uL (ref 0.0–0.5)
EOS%: 3.1 % (ref 0.0–7.0)
HEMATOCRIT: 30.1 % — AB (ref 34.8–46.6)
HGB: 10.3 g/dL — ABNORMAL LOW (ref 11.6–15.9)
LYMPH%: 24.2 % (ref 14.0–49.7)
MCH: 32.6 pg (ref 25.1–34.0)
MCHC: 34.2 g/dL (ref 31.5–36.0)
MCV: 95.3 fL (ref 79.5–101.0)
MONO#: 0.4 10*3/uL (ref 0.1–0.9)
MONO%: 17.6 % — ABNORMAL HIGH (ref 0.0–14.0)
NEUT#: 1.2 10*3/uL — ABNORMAL LOW (ref 1.5–6.5)
NEUT%: 54.2 % (ref 38.4–76.8)
Platelets: 145 10*3/uL (ref 145–400)
RBC: 3.16 10*6/uL — ABNORMAL LOW (ref 3.70–5.45)
RDW: 18.3 % — AB (ref 11.2–14.5)
WBC: 2.3 10*3/uL — ABNORMAL LOW (ref 3.9–10.3)
lymph#: 0.6 10*3/uL — ABNORMAL LOW (ref 0.9–3.3)

## 2014-07-29 MED ORDER — PALBOCICLIB 125 MG PO CAPS: 100.0000 mg | ORAL_CAPSULE | Freq: Every day | ORAL | Status: DC

## 2014-07-29 NOTE — Telephone Encounter (Signed)
Pt confirmed labs/ov per 07/20 POF, gave pt AVS and Calendar... KJ °

## 2014-07-29 NOTE — Progress Notes (Signed)
[place]  [phone] [phone]     ID: Carolyn [name] DOB: 12/18/1970  MR#: [id number]  ERX#:[id number]  Patient Care Team: [name], MD as PCP - General (Family Medicine) Thea Silversmith, MD (Radiation Oncology) Neldon Mc, MD as Surgeon (General Surgery) Chauncey Cruel, MD as Consulting Physician (Oncology) OTHER M.D.JN Susanne Greenhouse, Marye Round  CHIEF COMPLAINT: Estrogen receptor positive breast cancer  CURRENT TREATMENT: fulvestrant, palbociclib, zolendronate   BREAST CANCER HISTORY: From doctor Khan's of original intake node 05/31/2011:  "Carolyn [name] is a 44 y.o. female. Without significant past medical history. She underwent a mammogram that showed calcifications measuring 5 cm on the right breast. She then went on To have an ultrasound which showed the area to measure 2.9 cm. She was also found to have a right axillary lymph node that was suspicious for a malignancy. She had a biopsy of the calcifications that showed high-grade ductal carcinoma in situ. 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 In the lymph node biopsy there was no lymphatic tissue seen and was felt that the node was replaced by tumor. Patient went on to have an MRI of the bilateral breasts performed on evening of 05/30/2011. The MRI showed in the right breast 5 x 3 x 4.5 cm mass abutting the chest wall. About 7 cm away from this there was another mass in the upper inner quadrant that measured 1.5 x 1.7 x 1.5 cm. On the contralateral breast, that is the left breast a 2 cm area suspicious enhancement was noted also this up to date has not been biopsied and arrangements are being made for the biopsy to be performed. In this side there were no suspicious lymph nodes. The prognostic panel is pending. Patient is otherwise without any complaints. "  METASTATIC DISEASE: From the earlier summary note:  Sonam noted  some strange feelings around her umbilicus 95/09/3265. This felt like an area of numbness. Over the next 2 days she noted some leg weakness and difficulty walking, so she presented to the ED 04/12/2014. MRI of the thoraco-lumbar spine was obtained 04/12/2014 showing multiple bone lesions and compression fracture at T8 with retropulsion and cord compression. On 04/13/2014 she underwent Laminectomy T8 decompression of spinal cord posterior fixation from T6-T10 with pedicle screws and rods posterior arthrodesis with allograft. 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 HER-2 could not be obtained.  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 There were nonspecific L temporal bone changes and also possible involvement of the clivus and calvarium, but no other parenchymal brain lesions. Further staging studies included a bone scan, which failed to show the lytic lesion seen on other scans, and CTs of the chest, abdomen and pelvis on 06/08/2011, which showed very small right lung and left liver lesions which will require follow-up  Her subsequent history is as detailed below  INTERVAL HISTORY: Carolyn [name] returns today for follow up of her stage IV breast cancer. She continues on physical therapy and she is greatly benefiting from that. She is working on balance particularly. She walks with a cane. She recently traveled to [place] and developed a rash while there. She had been taking Cipro at the time. She stopped that medication, used some aqua 4, and  the rash disappeared.--She is tolerating the monthly fulvestrant with no side effects that she is aware of. She also tolerated the initial is alendronate dose in May with no side effects. She thinks the [facility] makes her a  little tired. Her neutrophils have held up pretty well although currently they are not high enough for her to resume treatment.   REVIEW OF SYSTEMS: Akisha bruises easily and since she has some balance issues she does bump into things some. The bruising however is consistent with that. She has some hot flashes. She denies headaches, nausea, vomiting, or vision changes. A detailed review of systems today was otherwise stable  PAST MEDICAL HISTORY: Past Medical History  Diagnosis Date  . 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 Hx of radiation therapy 11/08/11 -12/26/11    right chest wall/supraclav fossa, right scar  . History of chemotherapy 06/27/11 -09/04/11    neoadjuvant  . S/P radiation therapy 05/08/14    SRS brain    PAST SURGICAL HISTORY: Past Surgical History  Procedure Laterality Date  . Wisdom tooth extraction    . Portacath placement  06/20/2011    Procedure: INSERTION PORT-A-CATH;  Surgeon: Haywood Lasso, MD;  Location: [facility];  Service: General;  Laterality: N/A;  . Modified mastectomy  10/03/2011    Procedure: MODIFIED MASTECTOMY;  Surgeon: Haywood Lasso, MD;  Location: [facility];  Service: General;  Laterality: Right;  . Port-a-cath removal  01/30/2012    Procedure: MINOR REMOVAL PORT-A-CATH;  Surgeon: Haywood Lasso, MD;  Location: [facility];  Service: General;  Laterality: Left;  . Breast biopsy      Left  . Robotic assisted total hysterectomy with bilateral salpingo oopherectomy Bilateral 12/23/2012    Procedure: ROBOTIC ASSISTED TOTAL HYSTERECTOMY WITH BILATERAL SALPINGO OOPHORECTOMY;  Surgeon: Marvene Staff, MD;  Location: [facility];  Service: Gynecology;  Laterality: Bilateral;  . Cholecystectomy N/A 03/01/2014    Procedure: LAPAROSCOPIC CHOLECYSTECTOMY;  Surgeon: Leighton Ruff, MD;  Location: [facility];  Service: General;  Laterality: N/A;  . Laminectomy N/A 04/13/2014     Procedure: THORACIC LAMINECTOMY WITH FIXATION THORACIC SIX-THORACIC TEN FUSION;  Surgeon: Kristeen Miss, MD;  Location: [facility];  Service: Neurosurgery;  Laterality: N/A;    FAMILY HISTORY Family History  Problem Relation Age of Onset  . Breast cancer Maternal Aunt 73  . Pancreatic cancer Maternal Grandfather   . Pancreatic cancer Maternal Aunt     died in her 23s  . Leukemia Maternal Aunt     died in her 85s  . Ovarian cancer Cousin 29    maternal cousin   the patient's father is alive, currently 35 years old. The patient's mother died from complications of diabetes at the age of 49. The patient had no brothers, 4 sisters. One sister has died from congestive heart failure. The patient's mother is one of 5 sisters. One of them was diagnosed with breast cancer the age of 64. Also a cousin on the maternal side it was diagnosed with ovarian cancer. This was approximately age 67. There is also colon cancer in the family. The patient has had extensive genetic testing summarized below. She is however BRCA negative  GYNECOLOGIC HISTORY:  No LMP recorded. Patient is not currently having periods (Reason: Chemotherapy). Menarche age 79, she is GX P0. She underwent total hysterectomy with bilateral salpingo-oophorectomy December 2014.  SOCIAL HISTORY:  Vale works at [employer] for  [employer]. She mostly deals with government documents. She is single, lives by herself, with no pets. She attends [employer] locally.    ADVANCED DIRECTIVES: Not in place. The patient has a documents and intends to name her sister Kahli Mayon as healthcare power of attorney. Joelene Millin lives in [place] and can be reached at [place]: History  Substance Use Topics  . Smoking status: Never Smoker   . Smokeless tobacco: Never Used  . Alcohol Use: No     Colonoscopy:  PAP:  Bone density:  Lipid panel:  Allergies  Allergen Reactions  . Allegra [Fexofenadine] Hives    Abdomen only  .  Shellfish Allergy Hives    Abdomen only    Current Outpatient Prescriptions  Medication Sig Dispense Refill  . acetaminophen (TYLENOL) 325 MG tablet Take 2 tablets (650 mg total) by mouth every 6 (six) hours as needed for mild pain, moderate pain, fever or headache. (Patient not taking: Reported on 06/24/2014)    . Calcium Carbonate-Vit D-Min (CALCIUM 1200 PO) Take 1 tablet by mouth 2 (two) times daily.    . Cholecalciferol (VITAMIN D3) 2000 UNITS TABS Take 2,000 Units by mouth daily.    . ciprofloxacin (CIPRO) 500 MG tablet Take 1 tablet (500 mg total) by mouth 2 (two) times daily. 14 tablet 0  . fluconazole (DIFLUCAN) 100 MG tablet Take 1 tablet (100 mg total) by mouth daily. For 7 days total 7 tablet 0  . HYDROcodone-homatropine (HYCODAN) 5-1.5 MG/5ML syrup Take 5 mLs by mouth every 6 (six) hours as needed for cough. 120 mL 0  . iron polysaccharides (NIFEREX) 150 MG capsule Take 1 capsule (150 mg total) by mouth daily at 12 noon. 30 capsule 1  . Multiple Vitamin (MULTIVITAMIN) tablet Take 1 tablet by mouth daily.    . palbociclib (IBRANCE) 125 MG capsule Take 1 capsule (125 mg total) by mouth daily with breakfast. Take whole with food. 21 capsule 6   No current facility-administered medications for this visit.    OBJECTIVE: Middle-aged [place] American woman using a cane Filed Vitals:   07/29/14 1352  BP: 121/71  Pulse: 100  Temp: 99.1 F (37.3 C)  Resp: 18     Body mass index is 19.7 kg/(m^2).    ECOG FS:1 - Symptomatic but completely ambulatory  Sclerae unicteric, pupils round and equal Oropharynx clear and moist-- no thrush or other lesions No cervical or supraclavicular adenopathy Lungs no rales or rhonchi Heart regular rate and rhythm Abd soft, nontender, positive bowel sounds MSK no focal spinal tenderness, no upper extremity lymphedema Neuro: well oriented, appropriate affect Breasts: Deferred   LAB RESULTS:  CMP     Component Value Date/Time   NA 143 07/22/2014  1007   NA 141 04/21/2014 0910   K 3.9 07/22/2014 1007   K 3.6 04/21/2014 0910   CL 103 04/21/2014 0910   CL 102 01/16/2012 0804   CO2 29 07/22/2014 1007   CO2 27 04/21/2014 0910   GLUCOSE 85 07/22/2014 1007   GLUCOSE 133* 04/21/2014 0910   GLUCOSE 92 01/16/2012 0804   BUN 12.2 07/22/2014 1007   BUN 9 04/21/2014 0910   CREATININE 0.8 07/22/2014 1007   CREATININE 0.77 04/21/2014 0910   CALCIUM 9.7 07/22/2014 1007   CALCIUM 9.5 04/21/2014 0910   PROT 7.2 07/22/2014 1007   PROT 6.3 04/21/2014 0910   ALBUMIN 3.5 07/22/2014 1007   ALBUMIN 3.1* 04/21/2014 0910   AST 13 07/22/2014 1007   AST  26 04/21/2014 0910   ALT 9 07/22/2014 1007   ALT 19 04/21/2014 0910   ALKPHOS 78 07/22/2014 1007   ALKPHOS 99 04/21/2014 0910   BILITOT 0.35 07/22/2014 1007   BILITOT 0.5 04/21/2014 0910   GFRNONAA >90 04/21/2014 0910   GFRAA >90 04/21/2014 0910    I No results found for: SPEP  Lab Results  Component Value Date   WBC 2.3* 07/29/2014   NEUTROABS 1.2* 07/29/2014   HGB 10.3* 07/29/2014   HCT 30.1* 07/29/2014   MCV 95.3 07/29/2014   PLT 145 07/29/2014      Chemistry      Component Value Date/Time   NA 143 07/22/2014 1007   NA 141 04/21/2014 0910   K 3.9 07/22/2014 1007   K 3.6 04/21/2014 0910   CL 103 04/21/2014 0910   CL 102 01/16/2012 0804   CO2 29 07/22/2014 1007   CO2 27 04/21/2014 0910   BUN 12.2 07/22/2014 1007   BUN 9 04/21/2014 0910   CREATININE 0.8 07/22/2014 1007   CREATININE 0.77 04/21/2014 0910      Component Value Date/Time   CALCIUM 9.7 07/22/2014 1007   CALCIUM 9.5 04/21/2014 0910   ALKPHOS 78 07/22/2014 1007   ALKPHOS 99 04/21/2014 0910   AST 13 07/22/2014 1007   AST 26 04/21/2014 0910   ALT 9 07/22/2014 1007   ALT 19 04/21/2014 0910   BILITOT 0.35 07/22/2014 1007   BILITOT 0.5 04/21/2014 0910       Lab Results  Component Value Date   LABCA2 24 05/31/2011    No components found for: [id number]  No results for input(s): INR in the last 168  hours.  Urinalysis No results found for: COLORURINE  STUDIES: No results found.  ASSESSMENT: 44 y.o. BRCA negative [place] woman status post right breast upper outer quadrant and right axillary lymph node biopsy 05/18/2011, both positive for an invasive ductal carcinoma, high-grade, clinicallyT2 N1-2 or stage IIB/IIIA,  estrogen receptor 100% positive, progesterone receptor 87% positive, with an MIB-1 of 14% and no HER-2 amplification ([id number]).  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   (3)Treated neoadjuvantly with cyclophosphamide and docetaxel x4 completed 08/29/2011.  (4) status post right modified radical mastectomy 10/03/2011 showing a residual pT1c pN1a (3/18 lymph nodes positive) invasive ductal carcinoma, grade 1,estrogen receptor 100% positive, progesterone receptor negative, with no HER-2 amplification ([id number])  (a) anterolateral mastectomy pending, to be followed by reconstruction  (5) adjuvant radiation to the right chest wall, right supraclavicular fossa and right scar completed 12/26/2011  (6) tamoxifen started January 2014-- discontinued April 2016 with evidence of metastatic disease  (7) status post total hysterectomy with bilateral salpingo-oophorectomy 12/23/2012 with benign pathology ([id number])  METASTATIC DISEASE 04/13/2014 (8) presented with T8 cord compression and underwent laminectomy 04/13/2014 with T8 decompression of spinal cord, posterior fixation from T6-T10 with pedicle screws and rods, posterior arthrodesis with allograft. Pathology confirms an estrogen receptor positive, progesterone receptor negative metastatic  adenocarcinoma.   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 ExacTrac Snap verification was performed for each couch angle.   (b) radiation to the T-spine completed 05/08/2014.  (11) started fulvestrant 05/18/2014 and Palbociclib 06/01/2014  (a) Palbociclib dose decreased 200 mg daily, 21/7 as of 08/05/2014  (12) started zolendronate 05/18/2014  PLAN: WTajis doing remarkably well and is looking for to going back to work in August.  She is tolerating the fulvestrant with no side effects. She also did well with the zolendronate. She will receive both those medications August 3 an every 28 days for the next several months at least.  She did generally well with the Palbociclib, although it did cause her a little fatigue, she thinks, and it also dropped her neutrophil count. She will not be ready to resume this Friday. Instead she will have lab work again next Wednesday, 08/05/2014, and at that point likely she will start at a lower dose, namely 100 mg daily. She will continue to take the medication 21 days on 7 days off.  4 going to continue to check her labwork on a weekly basis until she is on a very stable Palbociclib dose.  She is already scheduled for repeat brain MRI in July and to follow-up with Dr. MLisbeth Renshaw She will see me again in October. If everything is stable at that time we will consider obtaining a new baseline CT of the chest sometime later this year.  She has a good understanding of the overall plan. She knows to call for any problems that may develop before her next visit here. MChauncey Cruel MD   07/29/2014 2:00 PM

## 2014-07-30 ENCOUNTER — Encounter: Payer: Self-pay | Admitting: Physical Therapy

## 2014-07-30 ENCOUNTER — Ambulatory Visit: Payer: BC Managed Care – PPO | Admitting: Physical Therapy

## 2014-07-30 DIAGNOSIS — R269 Unspecified abnormalities of gait and mobility: Secondary | ICD-10-CM

## 2014-07-30 DIAGNOSIS — R262 Difficulty in walking, not elsewhere classified: Secondary | ICD-10-CM

## 2014-07-30 DIAGNOSIS — R6889 Other general symptoms and signs: Secondary | ICD-10-CM

## 2014-07-30 DIAGNOSIS — R531 Weakness: Secondary | ICD-10-CM

## 2014-07-30 NOTE — Therapy (Signed)
[facility] [address] [place], [place], [address] Phone: [phone]   Fax:  [phone]  Physical Therapy Treatment  Patient Details  Name: Carolyn [name] MRN: [id number] Date of Birth: 1970/07/08 Referring Provider:  Meredith Staggers, MD  Encounter Date: 07/30/2014      PT End of Session - 07/30/14 1029    Visit Number 16   Date for PT Re-Evaluation 08/28/14   PT Start Time 1016   PT Stop Time 1102   PT Time Calculation (min) 46 min   Equipment Utilized During Treatment Gait belt   Activity Tolerance Patient tolerated treatment well   Behavior During Therapy [facility] for tasks assessed/performed      Past Medical History  Diagnosis Date  . 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 Hx of radiation therapy 11/08/11 -12/26/11    right chest wall/supraclav fossa, right scar  . History of chemotherapy 06/27/11 -09/04/11    neoadjuvant  . S/P radiation therapy 05/08/14    SRS brain    Past Surgical History  Procedure Laterality Date  . Wisdom tooth extraction    . Portacath placement  06/20/2011    Procedure: INSERTION PORT-A-CATH;  Surgeon: Haywood Lasso, MD;  Location: [facility];  Service: General;  Laterality: N/A;  . Modified mastectomy  10/03/2011    Procedure: MODIFIED MASTECTOMY;  Surgeon: Haywood Lasso, MD;  Location: [facility];  Service: General;  Laterality: Right;  . Port-a-cath removal  01/30/2012    Procedure: MINOR REMOVAL PORT-A-CATH;  Surgeon: Haywood Lasso, MD;  Location: [facility];  Service: General;  Laterality: Left;  . Breast biopsy      Left  . Robotic assisted total hysterectomy with bilateral salpingo oopherectomy Bilateral 12/23/2012    Procedure: ROBOTIC ASSISTED TOTAL HYSTERECTOMY WITH BILATERAL SALPINGO OOPHORECTOMY;  Surgeon: Marvene Staff, MD;  Location: [facility];  Service: Gynecology;   Laterality: Bilateral;  . Cholecystectomy N/A 03/01/2014    Procedure: LAPAROSCOPIC CHOLECYSTECTOMY;  Surgeon: Leighton Ruff, MD;  Location[facility];  Service: General;  Laterality: N/A;  . Laminectomy N/A 04/13/2014    Procedure: THORACIC LAMINECTOMY WITH FIXATION THORACIC SIX-THORACIC TEN FUSION;  Surgeon: Kristeen Miss, MD;  Location: [facility];  Service: Neurosurgery;  Laterality: N/A;    There were no vitals filed for this visit.  Visit Diagnosis:  Difficulty walking  Activity intolerance  Weakness generalized  Abnormality of gait      Subjective Assessment - 07/30/14 1024    Subjective Pt reports feeling more strength and confidence with ambulation   Currently in Pain? No/denies                         [facility] Adult PT Treatment/Exercise - 07/30/14 0001    Ambulation/Gait   Ambulation/Gait Yes   Ambulation/Gait Assistance 6: Modified independent (Device/Increase time)  pt ambulates without AD, but gaitbelt and supervision   Ambulation Distance (Feet) 60 Feet  4 x around gym = 372fet   Lumbar Exercises: Aerobic   Stationary Bike Bike L2, 6 min    UBE (Upper Arm Bike) Level 1 sitting x 6 minutes (3/3)   Lumbar Exercises: Standing   Row Strengthening;Both  20# with TA activation 2x10, one sitting restbreak b/w   Knee/Hip Exercises: Machines for Strengthening   Cybex Leg Press St6 55#, 3 x10 B LE rest break in between sets  Knee/Hip Exercises: Standing   Walking with Sports Cord 20# using handles walking backward/reverse 2x10  2# on each leg   Other Standing Knee Exercises standing hip extension/abduction 3x10   2# added                  PT Short Term Goals - 07/27/14 1458    PT SHORT TERM GOAL #1   Title be independent in initial HEP   Time 4   Period Weeks   Status Achieved   PT SHORT TERM GOAL #2   Title improve strength and safety to allow to wean from walker to cane 50% of the time in the home   Time 4   Period Weeks   Status  Achieved   PT SHORT TERM GOAL #3   Title walk for 25 minutes with walker without significant fatigue   Time 4   Period Weeks   Status Achieved           PT Long Term Goals - 07/27/14 1500    PT LONG TERM GOAL #1   Title be independent in advanced HEP   Time 6   Period Weeks   Status On-going   PT LONG TERM GOAL #2   Title wean from walker to use of cane at least 50% in the community   Time 8   Period Weeks   Status Achieved   PT LONG TERM GOAL #3   Title demonstrate heel to toe gait pattern on the Lt with level surface gait at least 50% of the time   Time 6   Period Weeks   Status Partially Met   PT LONG TERM GOAL #4   Title improve endurance to walk for 25 minutes with use of cane in the community   Time 8   Period Weeks   Status Achieved   PT LONG TERM GOAL #5   Title demonstrate 4+/5 Lt hip flexor and DF strength to improve safety with gait   Time 6   Period Weeks   Status Partially Met   PT LONG TERM GOAL #6   Title Wean from cane use at least 50% of the time for community ambulation    Time 6   Period Weeks   Status On-going               Plan - 07/30/14 1030    Clinical Impression Statement Pt continues to show improvement with confidence in ambulation using SPC. Pt will continue to benefit from skilled PT to continue to improve with strength and endurance   Pt will benefit from skilled therapeutic intervention in order to improve on the following deficits Abnormal gait;Difficulty walking;Decreased endurance;Decreased activity tolerance;Decreased strength;Decreased balance   Rehab Potential Good   PT Frequency 2x / week   PT Duration 8 weeks   PT Treatment/Interventions ADLs/Self Care Home Management;Therapeutic activities;Patient/family education;Therapeutic exercise;Manual techniques;Gait training;Cryotherapy;Neuromuscular re-education;Electrical Stimulation;Functional mobility training;Energy conservation   PT Next Visit Plan Continue to practice  ambulation without cane, quadriceps and back strength, balance activities   Consulted and Agree with Plan of Care Patient        Problem List Patient Active Problem List   Diagnosis Date Noted  . Oral thrush 06/23/2014  . Fever 06/17/2014  . Hypokalemia 06/17/2014  . Breast cancer metastasized to brain   . 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 Malnutrition of moderate degree 04/13/2014  . Myelopathy 04/12/2014  . Pathologic fracture of thoracic vertebrae 04/12/2014  . Biliary colic 74/25/9563  .  Breast cancer of upper-outer quadrant of right female breast 09/24/2013  . S/P total hysterectomy and bilateral salpingo-oophorectomy 12/23/2012  . History of chemotherapy   . Hx of radiation therapy     NAUMANN-HOUEGNIFIO,Kentrel Clevenger PTA 07/30/2014, 11:02 AM  [facility] [address] [place], [place], [address] Phone: [phone]   Fax:  [phone]

## 2014-08-03 ENCOUNTER — Encounter: Payer: BC Managed Care – PPO | Admitting: Physical Therapy

## 2014-08-04 ENCOUNTER — Ambulatory Visit: Payer: BC Managed Care – PPO

## 2014-08-04 DIAGNOSIS — R531 Weakness: Secondary | ICD-10-CM | POA: Diagnosis not present

## 2014-08-04 DIAGNOSIS — R262 Difficulty in walking, not elsewhere classified: Secondary | ICD-10-CM

## 2014-08-04 DIAGNOSIS — R6889 Other general symptoms and signs: Secondary | ICD-10-CM

## 2014-08-04 DIAGNOSIS — R269 Unspecified abnormalities of gait and mobility: Secondary | ICD-10-CM

## 2014-08-04 NOTE — Therapy (Signed)
[facility] [address] [place], [place], [address] Phone: [phone]   Fax:  [phone]  Physical Therapy Treatment  Patient Details  Name: Carolyn [name] MRN: [id number] Date of Birth: September 16, 1970 Referring Provider:  Meredith Staggers, MD  Encounter Date: 08/04/2014      PT End of Session - 08/04/14 1011    Visit Number 17   Date for PT Re-Evaluation 08/28/14   PT Start Time 0929   PT Stop Time 1013   PT Time Calculation (min) 44 min   Activity Tolerance Patient tolerated treatment well   Behavior During Therapy [facility] for tasks assessed/performed      Past Medical History  Diagnosis Date  . 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 Hx of radiation therapy 11/08/11 -12/26/11    right chest wall/supraclav fossa, right scar  . History of chemotherapy 06/27/11 -09/04/11    neoadjuvant  . S/P radiation therapy 05/08/14    SRS brain    Past Surgical History  Procedure Laterality Date  . Wisdom tooth extraction    . Portacath placement  06/20/2011    Procedure: INSERTION PORT-A-CATH;  Surgeon: Haywood Lasso, MD;  Location: [place];  Service: General;  Laterality: N/A;  . Modified mastectomy  10/03/2011    Procedure: MODIFIED MASTECTOMY;  Surgeon: Haywood Lasso, MD;  Location: [place];  Service: General;  Laterality: Right;  . Port-a-cath removal  01/30/2012    Procedure: MINOR REMOVAL PORT-A-CATH;  Surgeon: Haywood Lasso, MD;  Location: [name];  Service: General;  Laterality: Left;  . Breast biopsy      Left  . Robotic assisted total hysterectomy with bilateral salpingo oopherectomy Bilateral 12/23/2012    Procedure: ROBOTIC ASSISTED TOTAL HYSTERECTOMY WITH BILATERAL SALPINGO OOPHORECTOMY;  Surgeon: Marvene Staff, MD;  Location: [place] [facility];  Service: Gynecology;  Laterality: Bilateral;  . Cholecystectomy N/A 03/01/2014     Procedure: LAPAROSCOPIC CHOLECYSTECTOMY;  Surgeon: Leighton Ruff, MD;  [facility];  Service: General;  Laterality: N/A;  . Laminectomy N/A 04/13/2014    Procedure: THORACIC LAMINECTOMY WITH FIXATION THORACIC SIX-THORACIC TEN FUSION;  Surgeon: Kristeen Miss, MD;  Location: [place] [facility];  Service: Neurosurgery;  Laterality: N/A;    There were no vitals filed for this visit.  Visit Diagnosis:  Difficulty walking  Activity intolerance  Weakness generalized  Abnormality of gait      Subjective Assessment - 08/04/14 0938    Subjective Feeling stronger.  Able to walk with heel to toe gait more frequently   Currently in Pain? No/denies                         [facility] Adult PT Treatment/Exercise - 08/04/14 0001    Lumbar Exercises: Aerobic   Stationary Bike Bike L2, 6 min    Lumbar Exercises: Standing   Row Strengthening;Both  20# with TA activation 2x10, no rest needed today   Knee/Hip Exercises: Machines for Strengthening   Cybex Leg Press St6 55#, 3 x10 B LE, single leg 30# 2x10 each.  rest break in between sets    Knee/Hip Exercises: Standing   Other Standing Knee Exercises standing hip extension/abduction 3x10   2# added   Ankle Exercises: Standing   SLS 3x10 seconds each   Rebounder weight shifting x 1 minute, 3 directions   Heel Raises 20 reps  PT Short Term Goals - 07/27/14 1458    PT SHORT TERM GOAL #1   Title be independent in initial HEP   Time 4   Period Weeks   Status Achieved   PT SHORT TERM GOAL #2   Title improve strength and safety to allow to wean from walker to cane 50% of the time in the home   Time 4   Period Weeks   Status Achieved   PT SHORT TERM GOAL #3   Title walk for 25 minutes with walker without significant fatigue   Time 4   Period Weeks   Status Achieved           PT Long Term Goals - 08/04/14 0936    PT LONG TERM GOAL #1   Title be independent in advanced HEP   Time 6   Period  Weeks   Status On-going   PT LONG TERM GOAL #2   Title wean from walker to use of cane at least 50% in the community   Status Achieved   PT LONG TERM GOAL #3   Title demonstrate heel to toe gait pattern on the Lt with level surface gait at least 50% of the time   Time 6   Period Weeks   Status On-going  verbal cueing required by PT, able to do 10% of the time   PT LONG TERM GOAL #4   Title improve endurance to walk for 25 minutes with use of cane in the community   Status Achieved   PT LONG TERM GOAL #6   Title Wean from cane use at least 50% of the time for community ambulation    Time 6   Period Weeks   Status On-going  using cane all of the time for safety               Plan - 08/04/14 0948    Clinical Impression Statement Pt tolerated single leg on the leg press without difficulty today.  Pt will return to work at the begining of August and will benefit from continued PT for endurance and strength progression as pt remains weak with limited endurance for standing and walking.     Pt will benefit from skilled therapeutic intervention in order to improve on the following deficits Abnormal gait;Difficulty walking;Decreased endurance;Decreased activity tolerance;Decreased strength;Decreased balance   PT Frequency 2x / week   PT Duration 8 weeks   PT Treatment/Interventions ADLs/Self Care Home Management;Therapeutic activities;Patient/family education;Therapeutic exercise;Manual techniques;Gait training;Cryotherapy;Neuromuscular re-education;Electrical Stimulation;Functional mobility training;Energy conservation   PT Next Visit Plan Strength, balance, gait   Consulted and Agree with Plan of Care Patient        Problem List Patient Active Problem List   Diagnosis Date Noted  . Oral thrush 06/23/2014  . Fever 06/17/2014  . Hypokalemia 06/17/2014  . Breast cancer metastasized to brain   . 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 Malnutrition of moderate degree  04/13/2014  . Myelopathy 04/12/2014  . Pathologic fracture of thoracic vertebrae 04/12/2014  . Biliary colic 93/81/0175  . Breast cancer of upper-outer quadrant of right female breast 09/24/2013  . S/P total hysterectomy and bilateral salpingo-oophorectomy 12/23/2012  . History of chemotherapy   . Hx of radiation therapy     TAKACS,KELLY, PT 08/04/2014, 10:13 AM  [facility] [address] [place], [place], [address] Phone: [phone]   Fax:  [phone]

## 2014-08-05 ENCOUNTER — Other Ambulatory Visit: Payer: Self-pay | Admitting: Oncology

## 2014-08-05 ENCOUNTER — Encounter: Payer: Self-pay | Admitting: *Deleted

## 2014-08-05 ENCOUNTER — Other Ambulatory Visit (HOSPITAL_BASED_OUTPATIENT_CLINIC_OR_DEPARTMENT_OTHER): Payer: BC Managed Care – PPO

## 2014-08-05 ENCOUNTER — Telehealth: Payer: Self-pay | Admitting: *Deleted

## 2014-08-05 ENCOUNTER — Other Ambulatory Visit: Payer: Self-pay | Admitting: *Deleted

## 2014-08-05 DIAGNOSIS — C50411 Malignant neoplasm of upper-outer quadrant of right female breast: Secondary | ICD-10-CM

## 2014-08-05 DIAGNOSIS — C50811 Malignant neoplasm of overlapping sites of right female breast: Secondary | ICD-10-CM | POA: Diagnosis not present

## 2014-08-05 LAB — COMPREHENSIVE METABOLIC PANEL (CC13)
ALT: 11 U/L (ref 0–55)
AST: 21 U/L (ref 5–34)
Albumin: 3.7 g/dL (ref 3.5–5.0)
Alkaline Phosphatase: 86 U/L (ref 40–150)
Anion Gap: 8 mEq/L (ref 3–11)
BUN: 8.2 mg/dL (ref 7.0–26.0)
CHLORIDE: 107 meq/L (ref 98–109)
CO2: 28 mEq/L (ref 22–29)
CREATININE: 0.8 mg/dL (ref 0.6–1.1)
Calcium: 9.8 mg/dL (ref 8.4–10.4)
EGFR: >90 mL/min/{1.73_m2} (ref >90)
GLUCOSE: 96 mg/dL (ref 70–140)
POTASSIUM: 4.1 meq/L (ref 3.5–5.1)
Sodium: 143 mEq/L (ref 136–145)
TOTAL PROTEIN: 7.2 g/dL (ref 6.4–8.3)
Total Bilirubin: 0.38 mg/dL (ref 0.20–1.20)

## 2014-08-05 LAB — CBC WITH DIFFERENTIAL/PLATELET
BASO%: 1.4 % (ref 0.0–2.0)
BASOS ABS: 0 10*3/uL (ref 0.0–0.1)
EOS%: 2.4 % (ref 0.0–7.0)
Eosinophils Absolute: 0.1 10*3/uL (ref 0.0–0.5)
HCT: 31.7 % — ABNORMAL LOW (ref 34.8–46.6)
HEMOGLOBIN: 11 g/dL — AB (ref 11.6–15.9)
LYMPH%: 28.4 % (ref 14.0–49.7)
MCH: 33.2 pg (ref 25.1–34.0)
MCHC: 34.7 g/dL (ref 31.5–36.0)
MCV: 95.8 fL (ref 79.5–101.0)
MONO#: 0.3 10*3/uL (ref 0.1–0.9)
MONO%: 16.1 % — AB (ref 0.0–14.0)
NEUT#: 1.1 10*3/uL — ABNORMAL LOW (ref 1.5–6.5)
NEUT%: 51.7 % (ref 38.4–76.8)
PLATELETS: 227 10*3/uL (ref 145–400)
RBC: 3.31 10*6/uL — AB (ref 3.70–5.45)
RDW: 16.5 % — ABNORMAL HIGH (ref 11.2–14.5)
WBC: 2.1 10*3/uL — ABNORMAL LOW (ref 3.9–10.3)
lymph#: 0.6 10*3/uL — ABNORMAL LOW (ref 0.9–3.3)

## 2014-08-05 MED ORDER — PALBOCICLIB 100 MG PO CAPS: 100.0000 mg | ORAL_CAPSULE | Freq: Every day | ORAL | Status: DC

## 2014-08-05 NOTE — Telephone Encounter (Signed)
Patient called requesting a letter to return to work and to ask about the doseage of her [facility].  Advised patient that new rx for Ibrance  100 mg sent to [facility] outpatient pharmacy but patient's labs were not where they needed to be to restart the medication. Work letter left at [employer] and sister is to come pick up.

## 2014-08-06 ENCOUNTER — Ambulatory Visit: Payer: BC Managed Care – PPO

## 2014-08-06 DIAGNOSIS — R262 Difficulty in walking, not elsewhere classified: Secondary | ICD-10-CM

## 2014-08-06 DIAGNOSIS — R531 Weakness: Secondary | ICD-10-CM | POA: Diagnosis not present

## 2014-08-06 DIAGNOSIS — R6889 Other general symptoms and signs: Secondary | ICD-10-CM

## 2014-08-06 DIAGNOSIS — R269 Unspecified abnormalities of gait and mobility: Secondary | ICD-10-CM

## 2014-08-06 NOTE — Therapy (Signed)
[facility] [address] [place], [place], [address] Phone: [phone]   Fax:  [phone]  Physical Therapy Treatment  Patient Details  Name: Carolyn [name] MRN: [id number] Date of Birth: 19-Aug-1970 Referring Provider:  Meredith Staggers, MD  Encounter Date: 08/06/2014      PT End of Session - 08/06/14 1242    Visit Number 18   Date for PT Re-Evaluation 08/28/14   PT Start Time 1145   PT Stop Time 1235   PT Time Calculation (min) 50 min   Equipment Utilized During Treatment Gait belt   Activity Tolerance Patient tolerated treatment well   Behavior During Therapy [facility] for tasks assessed/performed      Past Medical History  Diagnosis Date  . 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 Hx of radiation therapy 11/08/11 -12/26/11    right chest wall/supraclav fossa, right scar  . History of chemotherapy 06/27/11 -09/04/11    neoadjuvant  . S/P radiation therapy 05/08/14    SRS brain    Past Surgical History  Procedure Laterality Date  . Wisdom tooth extraction    . Portacath placement  06/20/2011    Procedure: INSERTION PORT-A-CATH;  Surgeon: Haywood Lasso, MD;  Location: [place];  Service: General;  Laterality: N/A;  . Modified mastectomy  10/03/2011    Procedure: MODIFIED MASTECTOMY;  Surgeon: Haywood Lasso, MD;  Location: [place];  Service: General;  Laterality: Right;  . Port-a-cath removal  01/30/2012    Procedure: MINOR REMOVAL PORT-A-CATH;  Surgeon: Haywood Lasso, MD;  Location: [place];  Service: General;  Laterality: Left;  . Breast biopsy      Left  . Robotic assisted total hysterectomy with bilateral salpingo oopherectomy Bilateral 12/23/2012    Procedure: ROBOTIC ASSISTED TOTAL HYSTERECTOMY WITH BILATERAL SALPINGO OOPHORECTOMY;  Surgeon: Marvene Staff, MD;  Location: [place] [facility];  Service: Gynecology;   Laterality: Bilateral;  . Cholecystectomy N/A 03/01/2014    Procedure: LAPAROSCOPIC CHOLECYSTECTOMY;  Surgeon: Leighton Ruff, MD;  Location[facility];  Service: General;  Laterality: N/A;  . Laminectomy N/A 04/13/2014    Procedure: THORACIC LAMINECTOMY WITH FIXATION THORACIC SIX-THORACIC TEN FUSION;  Surgeon: Kristeen Miss, MD;  Location: [place] [facility];  Service: Neurosurgery;  Laterality: N/A;    There were no vitals filed for this visit.  Visit Diagnosis:  Weakness generalized  Abnormality of gait  Difficulty walking  Activity intolerance      Subjective Assessment - 08/06/14 1149    Subjective Feeling good today.    Patient Stated Goals Increase strength in legs, walk without cane, improve shoulder strength   Currently in Pain? No/denies                         [facility] Adult PT Treatment/Exercise - 08/06/14 0001    Lumbar Exercises: Aerobic   Stationary Bike Nustep: resistance 1, seat 9, 7 min    Knee/Hip Exercises: Machines for Strengthening   Cybex Leg Press St6 55#, 3 x10 B LE, single leg 30# 2x10 each.  rest break in between sets    Knee/Hip Exercises: Standing   Functional Squat --   SLS 30 secondsx3; alternate legs   one finger support for Lt. side   Walking with Sports Cord 20# backward, supervision required 8x   10# forward   Other Standing Knee Exercises standing hip extension/abduction 3x10  2# added; stance leg on dark blue pad    Other Standing Knee Exercises tandem walking at counter; tandem on foam pad    x10                  PT Short Term Goals - 07/27/14 1458    PT SHORT TERM GOAL #1   Title be independent in initial HEP   Time 4   Period Weeks   Status Achieved   PT SHORT TERM GOAL #2   Title improve strength and safety to allow to wean from walker to cane 50% of the time in the home   Time 4   Period Weeks   Status Achieved   PT SHORT TERM GOAL #3   Title walk for 25 minutes with walker without significant fatigue   Time  4   Period Weeks   Status Achieved           PT Long Term Goals - 08/04/14 6063    PT LONG TERM GOAL #1   Title be independent in advanced HEP   Time 6   Period Weeks   Status On-going   PT LONG TERM GOAL #2   Title wean from walker to use of cane at least 50% in the community   Status Achieved   PT LONG TERM GOAL #3   Title demonstrate heel to toe gait pattern on the Lt with level surface gait at least 50% of the time   Time 6   Period Weeks   Status On-going  verbal cueing required by PT, able to do 10% of the time   PT LONG TERM GOAL #4   Title improve endurance to walk for 25 minutes with use of cane in the community   Status Achieved   PT LONG TERM GOAL #6   Title Wean from cane use at least 50% of the time for community ambulation    Time 6   Period Weeks   Status On-going  using cane all of the time for safety               Plan - 08/06/14 1243    Clinical Impression Statement Improved performance with SLS and tandem walking with practice; noted weakness Lt. LE compared to Rt. Continues to demonstrate balance/endurance and would benefit from skilled PT for gait training, balance training and  endurance. 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    Pt will benefit from skilled therapeutic intervention in order to improve on the following deficits Abnormal gait;Difficulty walking;Decreased endurance;Decreased activity tolerance;Decreased strength;Decreased balance   Rehab Potential Good   PT Frequency 2x / week   PT Duration 8 weeks   PT Treatment/Interventions ADLs/Self Care Home Management;Therapeutic activities;Patient/family education;Therapeutic exercise;Manual techniques;Gait training;Cryotherapy;Neuromuscular re-education;Electrical Stimulation;Functional mobility training;Energy conservation   PT Next Visit Plan Gait training, balance training    Consulted and Agree with Plan of Care Patient        Problem List Patient Active  Problem List   Diagnosis Date Noted  . Oral thrush 06/23/2014  . Fever 06/17/2014  . Hypokalemia 06/17/2014  . Breast cancer metastasized to brain   . 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 Malnutrition of moderate degree 04/13/2014  . Myelopathy 04/12/2014  . Pathologic fracture of thoracic vertebrae 04/12/2014  . Biliary colic 01/60/1093  . Breast cancer of upper-outer quadrant of right female breast 09/24/2013  . S/P total hysterectomy and bilateral salpingo-oophorectomy 12/23/2012  . History of  chemotherapy   . Hx of radiation therapy    Reginal Lutes, SPT 08/06/2014 12:52 PM  During this treatment session, the therapist was present, participating in, and directing the treatment. TAKACS,KELLY 08/06/2014, 12:52 PM  [facility] [address] [place], [place], [address] Phone: [phone]   Fax:  [phone]

## 2014-08-10 ENCOUNTER — Ambulatory Visit: Payer: BC Managed Care – PPO

## 2014-08-10 ENCOUNTER — Ambulatory Visit: Payer: BC Managed Care – PPO | Attending: Physical Medicine & Rehabilitation

## 2014-08-10 DIAGNOSIS — R262 Difficulty in walking, not elsewhere classified: Secondary | ICD-10-CM | POA: Insufficient documentation

## 2014-08-10 DIAGNOSIS — R6889 Other general symptoms and signs: Secondary | ICD-10-CM | POA: Diagnosis present

## 2014-08-10 DIAGNOSIS — R269 Unspecified abnormalities of gait and mobility: Secondary | ICD-10-CM | POA: Diagnosis present

## 2014-08-10 DIAGNOSIS — C50919 Malignant neoplasm of unspecified site of unspecified female breast: Secondary | ICD-10-CM | POA: Insufficient documentation

## 2014-08-10 DIAGNOSIS — C7931 Secondary malignant neoplasm of brain: Secondary | ICD-10-CM | POA: Diagnosis present

## 2014-08-10 DIAGNOSIS — R531 Weakness: Secondary | ICD-10-CM | POA: Insufficient documentation

## 2014-08-10 NOTE — Therapy (Signed)
[facility] [address] [place], [place], [address] Phone: [phone]   Fax:  [phone]  Physical Therapy Treatment  Patient Details  Name: Carolyn [name] MRN: [id number] Date of Birth: [birth year] Referring Provider:  Meredith Staggers, MD  Encounter Date: 08/10/2014      PT End of Session - 08/10/14 1014    Visit Number 19   Date for PT Re-Evaluation 08/28/14   PT Start Time 0943   PT Stop Time 1020   PT Time Calculation (min) 37 min   Activity Tolerance Patient tolerated treatment well   Behavior During Therapy [facility] for tasks assessed/performed      Past Medical History  Diagnosis Date  . 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 Hx of radiation therapy 11/08/11 -12/26/11    right chest wall/supraclav fossa, right scar  . History of chemotherapy 06/27/11 -09/04/11    neoadjuvant  . S/P radiation therapy 05/08/14    SRS brain    Past Surgical History  Procedure Laterality Date  . Wisdom tooth extraction    . Portacath placement  06/20/2011    Procedure: INSERTION PORT-A-CATH;  Surgeon: Haywood Lasso, MD;  Location: [place];  Service: General;  Laterality: N/A;  . Modified mastectomy  10/03/2011    Procedure: MODIFIED MASTECTOMY;  Surgeon: Haywood Lasso, MD;  Location: [place];  Service: General;  Laterality: Right;  . Port-a-cath removal  01/30/2012    Procedure: MINOR REMOVAL PORT-A-CATH;  Surgeon: Haywood Lasso, MD;  Location: [place];  Service: General;  Laterality: Left;  . Breast biopsy      Left  . Robotic assisted total hysterectomy with bilateral salpingo oopherectomy Bilateral 12/23/2012    Procedure: ROBOTIC ASSISTED TOTAL HYSTERECTOMY WITH BILATERAL SALPINGO OOPHORECTOMY;  Surgeon: Marvene Staff, MD;  Location: [place] [facility];  Service: Gynecology;  Laterality: Bilateral;  . Cholecystectomy N/A 03/01/2014     Procedure: LAPAROSCOPIC CHOLECYSTECTOMY;  Surgeon: Leighton Ruff, MD;  [facility];  Service: General;  Laterality: N/A;  . Laminectomy N/A 04/13/2014    Procedure: THORACIC LAMINECTOMY WITH FIXATION THORACIC SIX-THORACIC TEN FUSION;  Surgeon: Kristeen Miss, MD;  Location: [place] [facility];  Service: Neurosurgery;  Laterality: N/A;    There were no vitals filed for this visit.  Visit Diagnosis:  Weakness generalized  Abnormality of gait  Activity intolerance      Subjective Assessment - 08/10/14 0948    Subjective Pt went back to work today. Late for appt.     Currently in Pain? No/denies                         [facility] Adult PT Treatment/Exercise - 08/10/14 0001    Lumbar Exercises: Aerobic   Stationary Bike Nustep: resistance 1, seat 9, 7 min    UBE (Upper Arm Bike) Level 1 sitting x 6 minutes (3/3)   Lumbar Exercises: Standing   Row Strengthening;Both  25# with TA activation 2x10, no rest needed today   Knee/Hip Exercises: Machines for Strengthening   Cybex Leg Press St6 55#, 3 x10 B LE, single leg 30# 2x10 each.  rest break in between sets    Knee/Hip Exercises: Standing   Walking with Sports Cord 20# backward, 10# forward, supervision required x10                  PT Short Term  Goals - 07/27/14 1458    PT SHORT TERM GOAL #1   Title be independent in initial HEP   Time 4   Period Weeks   Status Achieved   PT SHORT TERM GOAL #2   Title improve strength and safety to allow to wean from walker to cane 50% of the time in the home   Time 4   Period Weeks   Status Achieved   PT SHORT TERM GOAL #3   Title walk for 25 minutes with walker without significant fatigue   Time 4   Period Weeks   Status Achieved           PT Long Term Goals - 08/10/14 0950    PT LONG TERM GOAL #1   Title be independent in advanced HEP   Time 6   Period Weeks   Status On-going  independent in current HEP   PT LONG TERM GOAL #3   Title demonstrate heel to  toe gait pattern on the Lt with level surface gait at least 50% of the time   Time 6   Period Weeks   Status On-going  15% consistency reported   PT LONG TERM GOAL #6   Title Wean from cane use at least 50% of the time for community ambulation    Time 6   Period Weeks   Status On-going  using cane all of the time for safety               Plan - 08/10/14 0959    Clinical Impression Statement Pt with continued use of cane in the community for safety.  Pt uses heel to toe gait ~15% of the time in the community.  Pt with continued LE weakness due to illness.  Pt will benefit from skilled PT for balance, gait and UE/LE strength.     Pt will benefit from skilled therapeutic intervention in order to improve on the following deficits Abnormal gait;Difficulty walking;Decreased endurance;Decreased activity tolerance;Decreased strength;Decreased balance   Rehab Potential Good   PT Frequency 2x / week   PT Duration 8 weeks   PT Treatment/Interventions ADLs/Self Care Home Management;Therapeutic activities;Patient/family education;Therapeutic exercise;Manual techniques;Gait training;Cryotherapy;Neuromuscular re-education;Electrical Stimulation;Functional mobility training;Energy conservation   PT Next Visit Plan Gait, balance and strength   Consulted and Agree with Plan of Care Patient        Problem List Patient Active Problem List   Diagnosis Date Noted  . Oral thrush 06/23/2014  . Fever 06/17/2014  . Hypokalemia 06/17/2014  . Breast cancer metastasized to brain   . 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 Malnutrition of moderate degree 04/13/2014  . Myelopathy 04/12/2014  . Pathologic fracture of thoracic vertebrae 04/12/2014  . Biliary colic 36/62/9476  . Breast cancer of upper-outer quadrant of right female breast 09/24/2013  . S/P total hysterectomy and bilateral salpingo-oophorectomy 12/23/2012  . History of chemotherapy   . Hx of radiation therapy     Zayde Stroupe,  PT 08/10/2014, 10:15 AM  [place] [facility] [address] [place], [place], [address] Phone: [phone]   Fax:  [phone]

## 2014-08-12 ENCOUNTER — Ambulatory Visit: Payer: BC Managed Care – PPO

## 2014-08-12 ENCOUNTER — Other Ambulatory Visit (HOSPITAL_BASED_OUTPATIENT_CLINIC_OR_DEPARTMENT_OTHER): Payer: BC Managed Care – PPO

## 2014-08-12 ENCOUNTER — Encounter: Payer: BC Managed Care – PPO | Admitting: Physical Therapy

## 2014-08-12 ENCOUNTER — Ambulatory Visit (HOSPITAL_BASED_OUTPATIENT_CLINIC_OR_DEPARTMENT_OTHER): Payer: BC Managed Care – PPO

## 2014-08-12 VITALS — BP 102/70 | HR 89 | Temp 98.1°F | Resp 18

## 2014-08-12 DIAGNOSIS — C7931 Secondary malignant neoplasm of brain: Secondary | ICD-10-CM

## 2014-08-12 DIAGNOSIS — C7951 Secondary malignant neoplasm of bone: Secondary | ICD-10-CM | POA: Diagnosis not present

## 2014-08-12 DIAGNOSIS — C50811 Malignant neoplasm of overlapping sites of right female breast: Secondary | ICD-10-CM | POA: Diagnosis not present

## 2014-08-12 DIAGNOSIS — C50411 Malignant neoplasm of upper-outer quadrant of right female breast: Secondary | ICD-10-CM

## 2014-08-12 DIAGNOSIS — Z5111 Encounter for antineoplastic chemotherapy: Secondary | ICD-10-CM

## 2014-08-12 DIAGNOSIS — C50919 Malignant neoplasm of unspecified site of unspecified female breast: Secondary | ICD-10-CM

## 2014-08-12 LAB — CBC WITH DIFFERENTIAL/PLATELET
BASO%: 0.7 % (ref 0.0–2.0)
Basophils Absolute: 0 10*3/uL (ref 0.0–0.1)
EOS%: 1.9 % (ref 0.0–7.0)
Eosinophils Absolute: 0.1 10*3/uL (ref 0.0–0.5)
HEMATOCRIT: 32.6 % — AB (ref 34.8–46.6)
HEMOGLOBIN: 11.1 g/dL — AB (ref 11.6–15.9)
LYMPH%: 17.6 % (ref 14.0–49.7)
MCH: 32.9 pg (ref 25.1–34.0)
MCHC: 34 g/dL (ref 31.5–36.0)
MCV: 96.6 fL (ref 79.5–101.0)
MONO#: 0.4 10*3/uL (ref 0.1–0.9)
MONO%: 10.6 % (ref 0.0–14.0)
NEUT#: 2.7 10*3/uL (ref 1.5–6.5)
NEUT%: 69.2 % (ref 38.4–76.8)
Platelets: 250 10*3/uL (ref 145–400)
RBC: 3.37 10*6/uL — ABNORMAL LOW (ref 3.70–5.45)
RDW: 17.7 % — ABNORMAL HIGH (ref 11.2–14.5)
WBC: 4 10*3/uL (ref 3.9–10.3)
lymph#: 0.7 10*3/uL — ABNORMAL LOW (ref 0.9–3.3)

## 2014-08-12 LAB — COMPREHENSIVE METABOLIC PANEL (CC13)
ALT: 13 U/L (ref 0–55)
AST: 19 U/L (ref 5–34)
Albumin: 4 g/dL (ref 3.5–5.0)
Alkaline Phosphatase: 90 U/L (ref 40–150)
Anion Gap: 8 mEq/L (ref 3–11)
BILIRUBIN TOTAL: 0.24 mg/dL (ref 0.20–1.20)
BUN: 8.3 mg/dL (ref 7.0–26.0)
CHLORIDE: 106 meq/L (ref 98–109)
CO2: 27 mEq/L (ref 22–29)
Calcium: 9.5 mg/dL (ref 8.4–10.4)
Creatinine: 0.7 mg/dL (ref 0.6–1.1)
EGFR: >90 mL/min/{1.73_m2} (ref >90)
Glucose: 94 mg/dl (ref 70–140)
Potassium: 3.9 mEq/L (ref 3.5–5.1)
SODIUM: 142 meq/L (ref 136–145)
TOTAL PROTEIN: 7.4 g/dL (ref 6.4–8.3)

## 2014-08-12 MED ORDER — ZOLEDRONIC ACID 4 MG/100ML IV SOLN
4.0000 mg | Freq: Once | INTRAVENOUS | Status: AC
  Administered 2014-08-12: 4 mg via INTRAVENOUS
  Filled 2014-08-12: qty 100

## 2014-08-12 MED ORDER — SODIUM CHLORIDE 0.9 % IV SOLN
Freq: Once | INTRAVENOUS | Status: AC
  Administered 2014-08-12: 14:00:00 via INTRAVENOUS

## 2014-08-12 MED ORDER — FULVESTRANT 250 MG/5ML IM SOLN
500.0000 mg | Freq: Once | INTRAMUSCULAR | Status: AC
  Administered 2014-08-12: 500 mg via INTRAMUSCULAR
  Filled 2014-08-12: qty 10

## 2014-08-12 NOTE — Progress Notes (Signed)
Faslodex injections given by infusion nurse

## 2014-08-12 NOTE — Patient Instructions (Signed)
Zoledronic Acid injection (Hypercalcemia, Oncology) What is this medicine? ZOLEDRONIC ACID (ZOE le dron ik AS id) lowers the amount of calcium loss from bone. It is used to treat too much calcium in your blood from cancer. It is also used to prevent complications of cancer that has spread to the bone. This medicine may be used for other purposes; ask your health care provider or pharmacist if you have questions. COMMON BRAND NAME(S): Zometa What should I tell my health care provider before I take this medicine? They need to know if you have any of these conditions: -aspirin-sensitive asthma -cancer, especially if you are receiving medicines used to treat cancer -dental disease or wear dentures -infection -kidney disease -receiving corticosteroids like dexamethasone or prednisone -an unusual or allergic reaction to zoledronic acid, other medicines, foods, dyes, or preservatives -pregnant or trying to get pregnant -breast-feeding How should I use this medicine? This medicine is for infusion into a vein. It is given by a health care professional in a hospital or clinic setting. Talk to your pediatrician regarding the use of this medicine in children. Special care may be needed. Overdosage: If you think you have taken too much of this medicine contact a poison control center or emergency room at once. NOTE: This medicine is only for you. Do not share this medicine with others. What if I miss a dose? It is important not to miss your dose. Call your doctor or health care professional if you are unable to keep an appointment. What may interact with this medicine? -certain antibiotics given by injection -NSAIDs, medicines for pain and inflammation, like ibuprofen or naproxen -some diuretics like bumetanide, furosemide -teriparatide -thalidomide This list may not describe all possible interactions. Give your health care provider a list of all the medicines, herbs, non-prescription drugs, or  dietary supplements you use. Also tell them if you smoke, drink alcohol, or use illegal drugs. Some items may interact with your medicine. What should I watch for while using this medicine? Visit your doctor or health care professional for regular checkups. It may be some time before you see the benefit from this medicine. Do not stop taking your medicine unless your doctor tells you to. Your doctor may order blood tests or other tests to see how you are doing. Women should inform their doctor if they wish to become pregnant or think they might be pregnant. There is a potential for serious side effects to an unborn child. Talk to your health care professional or pharmacist for more information. You should make sure that you get enough calcium and vitamin D while you are taking this medicine. Discuss the foods you eat and the vitamins you take with your health care professional. Some people who take this medicine have severe bone, joint, and/or muscle pain. This medicine may also increase your risk for jaw problems or a broken thigh bone. Tell your doctor right away if you have severe pain in your jaw, bones, joints, or muscles. Tell your doctor if you have any pain that does not go away or that gets worse. Tell your dentist and dental surgeon that you are taking this medicine. You should not have major dental surgery while on this medicine. See your dentist to have a dental exam and fix any dental problems before starting this medicine. Take good care of your teeth while on this medicine. Make sure you see your dentist for regular follow-up appointments. What side effects may I notice from receiving this medicine? Side effects that   you should report to your doctor or health care professional as soon as possible: -allergic reactions like skin rash, itching or hives, swelling of the face, lips, or tongue -anxiety, confusion, or depression -breathing problems -changes in vision -eye pain -feeling faint or  lightheaded, falls -jaw pain, especially after dental work -mouth sores -muscle cramps, stiffness, or weakness -trouble passing urine or change in the amount of urine Side effects that usually do not require medical attention (report to your doctor or health care professional if they continue or are bothersome): -bone, joint, or muscle pain -constipation -diarrhea -fever -hair loss -irritation at site where injected -loss of appetite -nausea, vomiting -stomach upset -trouble sleeping -trouble swallowing -weak or tired This list may not describe all possible side effects. Call your doctor for medical advice about side effects. You may report side effects to FDA at 1-800-FDA-1088. Where should I keep my medicine? This drug is given in a hospital or clinic and will not be stored at home. NOTE: This sheet is a summary. It may not cover all possible information. If you have questions about this medicine, talk to your doctor, pharmacist, or health care provider.  2015, Elsevier/Gold Standard. (2012-06-06 13:03:13) Fulvestrant injection What is this medicine? FULVESTRANT (ful VES trant) blocks the effects of estrogen. It is used to treat breast cancer in women past the age of menopause. This medicine may be used for other purposes; ask your health care provider or pharmacist if you have questions. COMMON BRAND NAME(S): FASLODEX What should I tell my health care provider before I take this medicine? They need to know if you have any of these conditions: -bleeding problems -liver disease -low levels of platelets in the blood -an unusual or allergic reaction to fulvestrant, other medicines, foods, dyes, or preservatives -pregnant or trying to get pregnant -breast-feeding How should I use this medicine? This medicine is for injection into a muscle. It is usually given by a health care professional in a hospital or clinic setting. Talk to your pediatrician regarding the use of this  medicine in children. Special care may be needed. Overdosage: If you think you have taken too much of this medicine contact a poison control center or emergency room at once. NOTE: This medicine is only for you. Do not share this medicine with others. What if I miss a dose? It is important not to miss your dose. Call your doctor or health care professional if you are unable to keep an appointment. What may interact with this medicine? -medicines that treat or prevent blood clots like warfarin, enoxaparin, and dalteparin This list may not describe all possible interactions. Give your health care provider a list of all the medicines, herbs, non-prescription drugs, or dietary supplements you use. Also tell them if you smoke, drink alcohol, or use illegal drugs. Some items may interact with your medicine. What should I watch for while using this medicine? Your condition will be monitored carefully while you are receiving this medicine. You will need important blood work done while you are taking this medicine. Do not become pregnant while taking this medicine. Women should inform their doctor if they wish to become pregnant or think they might be pregnant. There is a potential for serious side effects to an unborn child. Talk to your health care professional or pharmacist for more information. What side effects may I notice from receiving this medicine? Side effects that you should report to your doctor or health care professional as soon as possible: -allergic reactions like   skin rash, itching or hives, swelling of the face, lips, or tongue -feeling faint or lightheaded, falls -fever or flu-like symptoms -sore throat -vaginal bleeding Side effects that usually do not require medical attention (report to your doctor or health care professional if they continue or are bothersome): -aches, pains -constipation or diarrhea -headache -hot flashes -nausea, vomiting -pain at site where  injected -stomach pain This list may not describe all possible side effects. Call your doctor for medical advice about side effects. You may report side effects to FDA at 1-800-FDA-1088. Where should I keep my medicine? This drug is given in a hospital or clinic and will not be stored at home. NOTE: This sheet is a summary. It may not cover all possible information. If you have questions about this medicine, talk to your doctor, pharmacist, or health care provider.  2015, Elsevier/Gold Standard. (2007-05-06 15:39:24)  

## 2014-08-17 ENCOUNTER — Ambulatory Visit: Payer: BC Managed Care – PPO | Admitting: Physical Therapy

## 2014-08-17 ENCOUNTER — Other Ambulatory Visit: Payer: BC Managed Care – PPO

## 2014-08-17 ENCOUNTER — Encounter: Payer: Self-pay | Admitting: Physical Therapy

## 2014-08-17 DIAGNOSIS — R269 Unspecified abnormalities of gait and mobility: Secondary | ICD-10-CM

## 2014-08-17 DIAGNOSIS — R531 Weakness: Secondary | ICD-10-CM | POA: Diagnosis not present

## 2014-08-17 DIAGNOSIS — R6889 Other general symptoms and signs: Secondary | ICD-10-CM

## 2014-08-17 DIAGNOSIS — R262 Difficulty in walking, not elsewhere classified: Secondary | ICD-10-CM

## 2014-08-17 NOTE — Therapy (Signed)
[facility] [address], [place], [place], [address] Phone: [phone]   Fax:  [phone]  Physical Therapy Treatment  Patient Details  Name: Carolyn [name] MRN: [id number] Date of Birth: 13-Jul-1970 Referring Provider:  Meredith Staggers, MD  Encounter Date: 08/17/2014    Past Medical History  Diagnosis Date  . 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 Hx of radiation therapy 11/08/11 -12/26/11    right chest wall/supraclav fossa, right scar  . History of chemotherapy 06/27/11 -09/04/11    neoadjuvant  . S/P radiation therapy 05/08/14    SRS brain    Past Surgical History  Procedure Laterality Date  . Wisdom tooth extraction    . Portacath placement  06/20/2011    Procedure: INSERTION PORT-A-CATH;  Surgeon: Haywood Lasso, MD;  Location: [place];  Service: General;  Laterality: N/A;  . Modified mastectomy  10/03/2011    Procedure: MODIFIED MASTECTOMY;  Surgeon: Haywood Lasso, MD;  Location: [place];  Service: General;  Laterality: Right;  . Port-a-cath removal  01/30/2012    Procedure: MINOR REMOVAL PORT-A-CATH;  Surgeon: Haywood Lasso, MD;  Location: [place];  Service: General;  Laterality: Left;  . Breast biopsy      Left  . Robotic assisted total hysterectomy with bilateral salpingo oopherectomy Bilateral 12/23/2012    Procedure: ROBOTIC ASSISTED TOTAL HYSTERECTOMY WITH BILATERAL SALPINGO OOPHORECTOMY;  Surgeon: Marvene Staff, MD;  Location: [facility];  Service: Gynecology;  Laterality: Bilateral;  . Cholecystectomy N/A 03/01/2014    Procedure: LAPAROSCOPIC CHOLECYSTECTOMY;  Surgeon: Leighton Ruff, MD;  Location: [facility];  Service: General;  Laterality: N/A;  . Laminectomy N/A 04/13/2014    Procedure: THORACIC LAMINECTOMY WITH FIXATION THORACIC SIX-THORACIC TEN FUSION;  Surgeon: Kristeen Miss, MD;  Location: [facility];   Service: Neurosurgery;  Laterality: N/A;    There were no vitals filed for this visit.  Visit Diagnosis:  Weakness generalized  Abnormality of gait  Activity intolerance  Difficulty walking      Subjective Assessment - 08/17/14 1550    Subjective Pt started her second week of work today, overall she tolerates well but notices some limitations with her endurance and feeling stiff after prolonged sitting   Pertinent History thoracic spine surgery for rod placement 04/12/14-Dr Elsner   Limitations Walking;Standing   Currently in Pain? No/denies   Multiple Pain Sites No                         [facility] Adult PT Treatment/Exercise - 08/17/14 0001    Lumbar Exercises: Aerobic   Stationary Bike Bike L2 x 74mn   UBE (Upper Arm Bike) Level 1 sitting x 6 minutes (3/3)   Lumbar Exercises: Standing   Row Strengthening;Both  25# with abdominal activation, no rest b/w sets   Knee/Hip Exercises: Machines for Strengthening   Cybex Leg Press St6 55#, 3 x10 B LE, single leg 30# 2x10 each.  rest break in between sets   incr weight to 60# next visit with B LE   Knee/Hip Exercises: Standing   Walking with Sports Cord 20# backward/reverse, 10# forward/reverse, supervision required x10                  PT Short Term Goals - 08/17/14 1556    PT SHORT TERM GOAL #1   Title be independent in initial HEP  Time 4   Period Weeks   Status Achieved   PT SHORT TERM GOAL #2   Title improve strength and safety to allow to wean from walker to cane 50% of the time in the home   Time 4   Period Weeks   Status Achieved   PT SHORT TERM GOAL #3   Title walk for 25 minutes with walker without significant fatigue   Time 4   Period Weeks   Status Achieved           PT Long Term Goals - 08/17/14 1557    PT LONG TERM GOAL #1   Title be independent in advanced HEP   Time 6   Period Weeks   Status On-going   PT LONG TERM GOAL #2   Title wean from walker to use of cane at  least 50% in the community   Time 8   Period Weeks   Status Achieved   PT LONG TERM GOAL #3   Title demonstrate heel to toe gait pattern on the Lt with level surface gait at least 50% of the time   Time 6   Period Weeks   Status On-going   PT LONG TERM GOAL #4   Title improve endurance to walk for 25 minutes with use of cane in the community   Time 8   Period Weeks   Status Achieved   PT LONG TERM GOAL #5   Title demonstrate 4+/5 Lt hip flexor and DF strength to improve safety with gait   Time 6   Period Weeks   Status Partially Met   PT LONG TERM GOAL #6   Title Wean from cane use at least 50% of the time for community ambulation    Time 6   Period Weeks   Status On-going               Plan - 08/17/14 1607    Clinical Impression Statement Pt continues to depend on single point cane for ambulation in the community. Pt with LE weakness and challenge with balance with resisted walking. Pt will continue to benefit from skilled PT for balance, gait and UE/LE strength.   Rehab Potential Good   PT Frequency 2x / week   PT Duration 8 weeks   PT Treatment/Interventions ADLs/Self Care Home Management;Therapeutic activities;Patient/family education;Therapeutic exercise;Manual techniques;Gait training;Cryotherapy;Neuromuscular re-education;Electrical Stimulation;Functional mobility training;Energy conservation   PT Next Visit Plan Gait, balance and strength   Consulted and Agree with Plan of Care Patient        Problem List Patient Active Problem List   Diagnosis Date Noted  . Oral thrush 06/23/2014  . Fever 06/17/2014  . Hypokalemia 06/17/2014  . Breast cancer metastasized to brain   . 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 Malnutrition of moderate degree 04/13/2014  . Myelopathy 04/12/2014  . Pathologic fracture of thoracic vertebrae 04/12/2014  . Biliary colic 16/96/7893  . Breast cancer of upper-outer quadrant of right female breast 09/24/2013  . S/P total  hysterectomy and bilateral salpingo-oophorectomy 12/23/2012  . History of chemotherapy   . Hx of radiation therapy     NAUMANN-HOUEGNIFIO,Tavia Stave PTA 08/17/2014, 4:14 PM  [facility] [address] [place], [place], [address] Phone: [phone]   Fax:  [phone]

## 2014-08-19 ENCOUNTER — Other Ambulatory Visit (HOSPITAL_BASED_OUTPATIENT_CLINIC_OR_DEPARTMENT_OTHER): Payer: BC Managed Care – PPO

## 2014-08-19 DIAGNOSIS — C50811 Malignant neoplasm of overlapping sites of right female breast: Secondary | ICD-10-CM

## 2014-08-19 DIAGNOSIS — C50411 Malignant neoplasm of upper-outer quadrant of right female breast: Secondary | ICD-10-CM

## 2014-08-19 LAB — CBC WITH DIFFERENTIAL/PLATELET
BASO%: 0.7 % (ref 0.0–2.0)
Basophils Absolute: 0 10*3/uL (ref 0.0–0.1)
EOS%: 4.1 % (ref 0.0–7.0)
Eosinophils Absolute: 0.2 10*3/uL (ref 0.0–0.5)
HEMATOCRIT: 32.8 % — AB (ref 34.8–46.6)
HGB: 11.1 g/dL — ABNORMAL LOW (ref 11.6–15.9)
LYMPH#: 0.5 10*3/uL — AB (ref 0.9–3.3)
LYMPH%: 13.8 % — AB (ref 14.0–49.7)
MCH: 32.6 pg (ref 25.1–34.0)
MCHC: 33.8 g/dL (ref 31.5–36.0)
MCV: 96.5 fL (ref 79.5–101.0)
MONO#: 0.4 10*3/uL (ref 0.1–0.9)
MONO%: 10 % (ref 0.0–14.0)
NEUT#: 2.8 10*3/uL (ref 1.5–6.5)
NEUT%: 71.4 % (ref 38.4–76.8)
Platelets: 220 10*3/uL (ref 145–400)
RBC: 3.4 10*6/uL — ABNORMAL LOW (ref 3.70–5.45)
RDW: 16.5 % — ABNORMAL HIGH (ref 11.2–14.5)
WBC: 3.9 10*3/uL (ref 3.9–10.3)

## 2014-08-19 LAB — COMPREHENSIVE METABOLIC PANEL (CC13)
ALBUMIN: 3.8 g/dL (ref 3.5–5.0)
ALT: 14 U/L (ref 0–55)
AST: 18 U/L (ref 5–34)
Alkaline Phosphatase: 84 U/L (ref 40–150)
Anion Gap: 7 mEq/L (ref 3–11)
BUN: 7.9 mg/dL (ref 7.0–26.0)
CALCIUM: 9.6 mg/dL (ref 8.4–10.4)
CHLORIDE: 106 meq/L (ref 98–109)
CO2: 30 mEq/L — ABNORMAL HIGH (ref 22–29)
Creatinine: 0.8 mg/dL (ref 0.6–1.1)
Glucose: 90 mg/dl (ref 70–140)
POTASSIUM: 3.9 meq/L (ref 3.5–5.1)
SODIUM: 143 meq/L (ref 136–145)
TOTAL PROTEIN: 7.1 g/dL (ref 6.4–8.3)
Total Bilirubin: 0.31 mg/dL (ref 0.20–1.20)

## 2014-08-20 ENCOUNTER — Encounter: Payer: Self-pay | Admitting: Physical Therapy

## 2014-08-20 ENCOUNTER — Ambulatory Visit: Payer: BC Managed Care – PPO | Admitting: Physical Therapy

## 2014-08-20 DIAGNOSIS — R262 Difficulty in walking, not elsewhere classified: Secondary | ICD-10-CM

## 2014-08-20 DIAGNOSIS — R269 Unspecified abnormalities of gait and mobility: Secondary | ICD-10-CM

## 2014-08-20 DIAGNOSIS — R531 Weakness: Secondary | ICD-10-CM | POA: Diagnosis not present

## 2014-08-20 DIAGNOSIS — R6889 Other general symptoms and signs: Secondary | ICD-10-CM

## 2014-08-20 NOTE — Therapy (Signed)
[facility] [address] [place], [place], [address] Phone: [phone]   Fax:  [phone]  Physical Therapy Treatment  Patient Details  Name: Carolyn [name] MRN: [id number] Date of Birth: 1970/10/26 Referring Provider:  Meredith Staggers, MD  Encounter Date: 08/20/2014      PT End of Session - 08/20/14 1558    Visit Number 21   Date for PT Re-Evaluation 08/28/14   PT Start Time 6837   PT Stop Time 1615   PT Time Calculation (min) 42 min   Activity Tolerance Patient tolerated treatment well   Behavior During Therapy [facility] for tasks assessed/performed      Past Medical History  Diagnosis Date  . 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 Hx of radiation therapy 11/08/11 -12/26/11    right chest wall/supraclav fossa, right scar  . History of chemotherapy 06/27/11 -09/04/11    neoadjuvant  . S/P radiation therapy 05/08/14    SRS brain    Past Surgical History  Procedure Laterality Date  . Wisdom tooth extraction    . Portacath placement  06/20/2011    Procedure: INSERTION PORT-A-CATH;  Surgeon: Haywood Lasso, MD;  Location: [place];  Service: General;  Laterality: N/A;  . Modified mastectomy  10/03/2011    Procedure: MODIFIED MASTECTOMY;  Surgeon: Haywood Lasso, MD;  Location: [place];  Service: General;  Laterality: Right;  . Port-a-cath removal  01/30/2012    Procedure: MINOR REMOVAL PORT-A-CATH;  Surgeon: Haywood Lasso, MD;  Location: [place];  Service: General;  Laterality: Left;  . Breast biopsy      Left  . Robotic assisted total hysterectomy with bilateral salpingo oopherectomy Bilateral 12/23/2012    Procedure: ROBOTIC ASSISTED TOTAL HYSTERECTOMY WITH BILATERAL SALPINGO OOPHORECTOMY;  Surgeon: Marvene Staff, MD;  Location: [facility];  Service: Gynecology;  Laterality: Bilateral;  . Cholecystectomy N/A 03/01/2014     Procedure: LAPAROSCOPIC CHOLECYSTECTOMY;  Surgeon: Leighton Ruff, MD;  Location[facility];  Service: General;  Laterality: N/A;  . Laminectomy N/A 04/13/2014    Procedure: THORACIC LAMINECTOMY WITH FIXATION THORACIC SIX-THORACIC TEN FUSION;  Surgeon: Kristeen Miss, MD;  Location: [place] [facility];  Service: Neurosurgery;  Laterality: N/A;    There were no vitals filed for this visit.  Visit Diagnosis:  Weakness generalized  Abnormality of gait  Activity intolerance  Difficulty walking      Subjective Assessment - 08/20/14 1551    Subjective Pt reports to continues to improve with strength and stamina, the working day's are getting easier   Currently in Pain? No/denies   Multiple Pain Sites No                         [facility] Adult PT Treatment/Exercise - 08/20/14 0001    Lumbar Exercises: Aerobic   Stationary Bike Bike L2 x 63mn   Elliptical L3/ R3 x 4 min    UBE (Upper Arm Bike) Level 1 sitting x 6 minutes (3/3)   Lumbar Exercises: Standing   Row Strengthening;Both  25# with focus on abdominal bracing   Knee/Hip Exercises: Machines for Strengthening   Cybex Leg Press St6 55#, 3 x10 B LE, single leg 30# 2x10 each.  rest break in between sets   incr the wieght to 60 #   Knee/Hip Exercises: Standing   Walking with Sports Cord 20# backward/reverse, 10# forward/reverse,  supervision required x10                  PT Short Term Goals - 08/17/14 1556    PT SHORT TERM GOAL #1   Title be independent in initial HEP   Time 4   Period Weeks   Status Achieved   PT SHORT TERM GOAL #2   Title improve strength and safety to allow to wean from walker to cane 50% of the time in the home   Time 4   Period Weeks   Status Achieved   PT SHORT TERM GOAL #3   Title walk for 25 minutes with walker without significant fatigue   Time 4   Period Weeks   Status Achieved           PT Long Term Goals - 08/17/14 1557    PT LONG TERM GOAL #1   Title be independent in  advanced HEP   Time 6   Period Weeks   Status On-going   PT LONG TERM GOAL #2   Title wean from walker to use of cane at least 50% in the community   Time 8   Period Weeks   Status Achieved   PT LONG TERM GOAL #3   Title demonstrate heel to toe gait pattern on the Lt with level surface gait at least 50% of the time   Time 6   Period Weeks   Status On-going   PT LONG TERM GOAL #4   Title improve endurance to walk for 25 minutes with use of cane in the community   Time 8   Period Weeks   Status Achieved   PT LONG TERM GOAL #5   Title demonstrate 4+/5 Lt hip flexor and DF strength to improve safety with gait   Time 6   Period Weeks   Status Partially Met   PT LONG TERM GOAL #6   Title Wean from cane use at least 50% of the time for community ambulation    Time 6   Period Weeks   Status On-going               Plan - 08/20/14 1600    Clinical Impression Statement Pt continues to depend on single point cane for ambulation in the community. Pt with improved balance with resisted walking, but still area of need of improvement. Pt will continue to benefit from skilled PT for balance,gait stamina and UE/LE strength.   Pt will benefit from skilled therapeutic intervention in order to improve on the following deficits Abnormal gait;Difficulty walking;Decreased endurance;Decreased activity tolerance;Decreased strength;Decreased balance   Rehab Potential Good   PT Frequency 2x / week   PT Duration 8 weeks   PT Treatment/Interventions ADLs/Self Care Home Management;Therapeutic activities;Patient/family education;Therapeutic exercise;Manual techniques;Gait training;Cryotherapy;Neuromuscular re-education;Electrical Stimulation;Functional mobility training;Energy conservation   PT Next Visit Plan Gait, balance and strength   Consulted and Agree with Plan of Care Patient        Problem List Patient Active Problem List   Diagnosis Date Noted  . Oral thrush 06/23/2014  . Fever  06/17/2014  . Hypokalemia 06/17/2014  . Breast cancer metastasized to brain   . 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 Malnutrition of moderate degree 04/13/2014  . Myelopathy 04/12/2014  . Pathologic fracture of thoracic vertebrae 04/12/2014  . Biliary colic 00/17/4944  . Breast cancer of upper-outer quadrant of right female breast 09/24/2013  . S/P total hysterectomy and bilateral salpingo-oophorectomy 12/23/2012  . History of chemotherapy   .  Hx of radiation therapy     NAUMANN-HOUEGNIFIO, PTA 08/20/2014, 4:08 PM  [facility] [address] [place], [place], [address] Phone: [phone]   Fax:  [phone]

## 2014-08-24 ENCOUNTER — Ambulatory Visit: Payer: BC Managed Care – PPO | Admitting: Physical Therapy

## 2014-08-24 ENCOUNTER — Ambulatory Visit
Admission: RE | Admit: 2014-08-24 | Discharge: 2014-08-24 | Disposition: A | Discharge status: Home / Self Care | Payer: BC Managed Care – PPO | Source: Ambulatory Visit

## 2014-08-24 ENCOUNTER — Encounter: Payer: Self-pay | Admitting: Physical Therapy

## 2014-08-24 ENCOUNTER — Ambulatory Visit: Payer: BC Managed Care – PPO | Admitting: Nurse Practitioner

## 2014-08-24 DIAGNOSIS — R531 Weakness: Secondary | ICD-10-CM | POA: Diagnosis not present

## 2014-08-24 DIAGNOSIS — R6889 Other general symptoms and signs: Secondary | ICD-10-CM

## 2014-08-24 DIAGNOSIS — R269 Unspecified abnormalities of gait and mobility: Secondary | ICD-10-CM

## 2014-08-24 DIAGNOSIS — R262 Difficulty in walking, not elsewhere classified: Secondary | ICD-10-CM

## 2014-08-24 DIAGNOSIS — Z1231 Encounter for screening mammogram for malignant neoplasm of breast: Secondary | ICD-10-CM

## 2014-08-24 NOTE — Therapy (Signed)
[facility] [address] [place], [place], [address] Phone: [phone]   Fax:  [phone]  Physical Therapy Treatment  Patient Details  Name: Carolyn [name] MRN: [id number] Date of Birth: 1970/10/28 Referring Provider:  Meredith Staggers, MD  Encounter Date: 08/24/2014      PT End of Session - 08/24/14 1544    Visit Number 22   Date for PT Re-Evaluation 08/28/14   PT Start Time 2426   PT Stop Time 1617   PT Time Calculation (min) 42 min   Activity Tolerance Patient tolerated treatment well   Behavior During Therapy [facility] for tasks assessed/performed      Past Medical History  Diagnosis Date  . 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 Hx of radiation therapy 11/08/11 -12/26/11    right chest wall/supraclav fossa, right scar  . History of chemotherapy 06/27/11 -09/04/11    neoadjuvant  . S/P radiation therapy 05/08/14    SRS brain    Past Surgical History  Procedure Laterality Date  . Wisdom tooth extraction    . Portacath placement  06/20/2011    Procedure: INSERTION PORT-A-CATH;  Surgeon: Haywood Lasso, MD;  Location: [place];  Service: General;  Laterality: N/A;  . Modified mastectomy  10/03/2011    Procedure: MODIFIED MASTECTOMY;  Surgeon: Haywood Lasso, MD;  Location: [place];  Service: General;  Laterality: Right;  . Port-a-cath removal  01/30/2012    Procedure: MINOR REMOVAL PORT-A-CATH;  Surgeon: Haywood Lasso, MD;  Location: [place];  Service: General;  Laterality: Left;  . Breast biopsy      Left  . Robotic assisted total hysterectomy with bilateral salpingo oopherectomy Bilateral 12/23/2012    Procedure: ROBOTIC ASSISTED TOTAL HYSTERECTOMY WITH BILATERAL SALPINGO OOPHORECTOMY;  Surgeon: Marvene Staff, MD;  Location: [facility];  Service: Gynecology;  Laterality: Bilateral;  . Cholecystectomy N/A 03/01/2014     Procedure: LAPAROSCOPIC CHOLECYSTECTOMY;  Surgeon: Leighton Ruff, MD;  [facility];  Service: General;  Laterality: N/A;  . Laminectomy N/A 04/13/2014    Procedure: THORACIC LAMINECTOMY WITH FIXATION THORACIC SIX-THORACIC TEN FUSION;  Surgeon: Kristeen Miss, MD;  Location: [place] [facility];  Service: Neurosurgery;  Laterality: N/A;    There were no vitals filed for this visit.  Visit Diagnosis:  Weakness generalized  Abnormality of gait  Activity intolerance  Difficulty walking      Subjective Assessment - 08/24/14 1542    Subjective Pt reports more active due to church, family events and back at work since 3 weeks. However, she reports unstaediness of gait and end of day due to fatique.   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  Yes   Is the patient reluctant to leave their home because of a fear of falling?  No   Home Ec[employer] residence   Prior Function   Level of Independence Independent   Vocation Full time employment   Vocation Requirements [place]- not working now   Leisure walking for exercise   Cognition   Overall Cognitive Status Within Functional Limits for tasks assessed   Memory Appears intact   Awareness Appears intact   Problem Solving Appears intact   Strength   Overall  Strength Deficits   Overall Strength Comments Bil hip flexion: 4/5; abduction 4-/5   Ambulation/Gait   Ambulation/Gait Yes   Ambulation/Gait Assistance 6: Modified independent (Device/Increase time)  pt depends on singl point cane   Ambulation Distance (Feet) --  all distance needed                     [facility] Adult PT Treatment/Exercise - 08/24/14 0001    Lumbar Exercises: Aerobic   Stationary Bike Nu-step L2 x 32mn seat#9, Arm # 8   Elliptical L3/ R3 x 1 min   was to tired to continue   UBE (Upper Arm Bike) Level 1 sitting x 6 minutes (3/3)   Lumbar Exercises: Standing   Row Strengthening;Both  25# with abdominal bracing   Knee/Hip Exercises: Machines for Strengthening   Cybex Leg Press St6 60#, 3 x10 B LE, single leg 30# 3x10 each rest break in between sets    Knee/Hip Exercises: Standing   Walking with Sports Cord 20# backward/reverse, 10# forward/reverse, supervision required x10                  PT Short Term Goals - 08/17/14 1556    PT SHORT TERM GOAL #1   Title be independent in initial HEP   Time 4   Period Weeks   Status Achieved   PT SHORT TERM GOAL #2   Title improve strength and safety to allow to wean from walker to cane 50% of the time in the home   Time 4   Period Weeks   Status Achieved   PT SHORT TERM GOAL #3   Title walk for 25 minutes with walker without significant fatigue   Time 4   Period Weeks   Status Achieved           PT Long Term Goals - 08/24/14 1557    PT LONG TERM GOAL #1   Title be independent in advanced HEP   Time 6   Period Weeks   Status On-going   PT LONG TERM GOAL #2   Title wean from walker to use of cane at least 50% in the community   Time 8   Period Weeks   Status Achieved   PT LONG TERM GOAL #3   Title demonstrate heel to toe gait pattern on the Lt with level surface gait at least 50% of the time   Time 6   Period Weeks   Status Achieved   PT LONG TERM GOAL #4   Title improve endurance to  walk for 25 minutes with use of cane in the community   Time 8   Period Weeks   Status Achieved   PT LONG TERM GOAL #5   Title demonstrate 4+/5 Lt hip flexor and DF strength to improve safety with gait   Time 6   Period Weeks  Status Partially Met   PT LONG TERM GOAL #6   Title Wean from cane use at least 50% of the time for community ambulation    Time 6   Period Weeks   Status On-going               Plan - 08/24/14 1554    Clinical Impression Statement Pt continues to depend on single point cane for ambulation, Balance with resisted walking is improving but still challenging. Pt will continue to benefit from skilled PT for balance, gait, stamina and UE/LE strength   Pt will benefit from skilled therapeutic intervention in order to improve on the following deficits Abnormal gait;Difficulty walking;Decreased endurance;Decreased activity tolerance;Decreased strength;Decreased balance   Rehab Potential Good   PT Frequency 2x / week   PT Duration 8 weeks   PT Treatment/Interventions ADLs/Self Care Home Management;Therapeutic activities;Patient/family education;Therapeutic exercise;Manual techniques;Gait training;Cryotherapy;Neuromuscular re-education;Electrical Stimulation;Functional mobility training;Energy conservation   PT Next Visit Plan Renewal to extend for 5 weeks to continue with strengt, balance and endurance   Consulted and Agree with Plan of Care Patient        Problem List Patient Active Problem List   Diagnosis Date Noted  . Oral thrush 06/23/2014  . Fever 06/17/2014  . Hypokalemia 06/17/2014  . Breast cancer metastasized to brain   . 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 Malnutrition of moderate degree 04/13/2014  . Myelopathy 04/12/2014  . Pathologic fracture of thoracic vertebrae 04/12/2014  . Biliary colic 63/14/9702  . Breast cancer of upper-outer quadrant of right female breast 09/24/2013  . S/P total hysterectomy and bilateral  salpingo-oophorectomy 12/23/2012  . History of chemotherapy   . Hx of radiation therapy    Rivka Barbara, PTA 08/24/2014 5:06 PM  Earlie Counts, PT 08/24/2014 5:13 PM    [facility] [address] [place], [place], [address] Phone: [phone]   Fax:  [phone]

## 2014-08-26 ENCOUNTER — Ambulatory Visit: Payer: BC Managed Care – PPO | Admitting: Nurse Practitioner

## 2014-08-26 ENCOUNTER — Other Ambulatory Visit: Payer: BC Managed Care – PPO

## 2014-08-26 ENCOUNTER — Other Ambulatory Visit (HOSPITAL_BASED_OUTPATIENT_CLINIC_OR_DEPARTMENT_OTHER): Payer: BC Managed Care – PPO

## 2014-08-26 DIAGNOSIS — C50411 Malignant neoplasm of upper-outer quadrant of right female breast: Secondary | ICD-10-CM

## 2014-08-26 DIAGNOSIS — C50811 Malignant neoplasm of overlapping sites of right female breast: Secondary | ICD-10-CM | POA: Diagnosis not present

## 2014-08-26 LAB — COMPREHENSIVE METABOLIC PANEL (CC13)
ALT: 16 U/L (ref 0–55)
AST: 21 U/L (ref 5–34)
Albumin: 3.8 g/dL (ref 3.5–5.0)
Alkaline Phosphatase: 85 U/L (ref 40–150)
Anion Gap: 8 mEq/L (ref 3–11)
BUN: 7.1 mg/dL (ref 7.0–26.0)
CO2: 26 mEq/L (ref 22–29)
Calcium: 9.3 mg/dL (ref 8.4–10.4)
Chloride: 110 mEq/L — ABNORMAL HIGH (ref 98–109)
Creatinine: 0.8 mg/dL (ref 0.6–1.1)
EGFR: >90 mL/min/{1.73_m2} (ref >90)
GLUCOSE: 89 mg/dL (ref 70–140)
POTASSIUM: 4.1 meq/L (ref 3.5–5.1)
SODIUM: 144 meq/L (ref 136–145)
Total Bilirubin: 0.25 mg/dL (ref 0.20–1.20)
Total Protein: 6.8 g/dL (ref 6.4–8.3)

## 2014-08-26 LAB — CBC WITH DIFFERENTIAL/PLATELET
BASO%: 0.6 % (ref 0.0–2.0)
BASOS ABS: 0 10*3/uL (ref 0.0–0.1)
EOS%: 3.7 % (ref 0.0–7.0)
Eosinophils Absolute: 0.2 10*3/uL (ref 0.0–0.5)
HCT: 29.4 % — ABNORMAL LOW (ref 34.8–46.6)
HEMOGLOBIN: 10 g/dL — AB (ref 11.6–15.9)
LYMPH%: 15.3 % (ref 14.0–49.7)
MCH: 32.4 pg (ref 25.1–34.0)
MCHC: 33.9 g/dL (ref 31.5–36.0)
MCV: 95.7 fL (ref 79.5–101.0)
MONO#: 0.4 10*3/uL (ref 0.1–0.9)
MONO%: 9.9 % (ref 0.0–14.0)
NEUT#: 3.1 10*3/uL (ref 1.5–6.5)
NEUT%: 70.5 % (ref 38.4–76.8)
Platelets: 187 10*3/uL (ref 145–400)
RBC: 3.08 10*6/uL — ABNORMAL LOW (ref 3.70–5.45)
RDW: 15.5 % — AB (ref 11.2–14.5)
WBC: 4.4 10*3/uL (ref 3.9–10.3)
lymph#: 0.7 10*3/uL — ABNORMAL LOW (ref 0.9–3.3)

## 2014-08-27 ENCOUNTER — Encounter: Payer: Self-pay | Admitting: Physical Therapy

## 2014-08-27 ENCOUNTER — Ambulatory Visit: Payer: BC Managed Care – PPO | Admitting: Physical Therapy

## 2014-08-27 DIAGNOSIS — R531 Weakness: Secondary | ICD-10-CM

## 2014-08-27 DIAGNOSIS — R6889 Other general symptoms and signs: Secondary | ICD-10-CM

## 2014-08-27 DIAGNOSIS — R269 Unspecified abnormalities of gait and mobility: Secondary | ICD-10-CM

## 2014-08-27 DIAGNOSIS — C7931 Secondary malignant neoplasm of brain: Secondary | ICD-10-CM

## 2014-08-27 DIAGNOSIS — C50919 Malignant neoplasm of unspecified site of unspecified female breast: Secondary | ICD-10-CM

## 2014-08-27 DIAGNOSIS — R262 Difficulty in walking, not elsewhere classified: Secondary | ICD-10-CM

## 2014-08-27 NOTE — Therapy (Signed)
[facility] [address], [place], [place], [address] Phone: [phone]   Fax:  [phone]  Physical Therapy Treatment  Patient Details  Name: Carolyn [name] MRN: [id number] Date of Birth: March 08, 1970 Referring Provider:  [name], MD  Encounter Date: 08/27/2014      PT End of Session - 08/27/14 1601    Visit Number 23   Date for PT Re-Evaluation 10/02/14   PT Start Time 1324   PT Stop Time 1619   PT Time Calculation (min) 44 min   Activity Tolerance Patient tolerated treatment well   Behavior During Therapy [facility] for tasks assessed/performed      Past Medical History  Diagnosis Date  . 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 Hx of radiation therapy 11/08/11 -12/26/11    right chest wall/supraclav fossa, right scar  . History of chemotherapy 06/27/11 -09/04/11    neoadjuvant  . S/P radiation therapy 05/08/14    SRS brain    Past Surgical History  Procedure Laterality Date  . Wisdom tooth extraction    . Portacath placement  06/20/2011    Procedure: INSERTION PORT-A-CATH;  Surgeon: Haywood Lasso, MD;  Location: [place];  Service: General;  Laterality: N/A;  . Modified mastectomy  10/03/2011    Procedure: MODIFIED MASTECTOMY;  Surgeon: Haywood Lasso, MD;  Location: [place];  Service: General;  Laterality: Right;  . Port-a-cath removal  01/30/2012    Procedure: MINOR REMOVAL PORT-A-CATH;  Surgeon: Haywood Lasso, MD;  Location: [place];  Service: General;  Laterality: Left;  . Breast biopsy      Left  . Robotic assisted total hysterectomy with bilateral salpingo oopherectomy Bilateral 12/23/2012    Procedure: ROBOTIC ASSISTED TOTAL HYSTERECTOMY WITH BILATERAL SALPINGO OOPHORECTOMY;  Surgeon: Marvene Staff, MD;  Location: [facility];  Service: Gynecology;  Laterality: Bilateral;  . Cholecystectomy N/A 03/01/2014   Procedure: LAPAROSCOPIC CHOLECYSTECTOMY;  Surgeon: Leighton Ruff, MD;  [facility];  Service: General;  Laterality: N/A;  . Laminectomy N/A 04/13/2014    Procedure: THORACIC LAMINECTOMY WITH FIXATION THORACIC SIX-THORACIC TEN FUSION;  Surgeon: Kristeen Miss, MD;  Location: [place] [facility];  Service: Neurosurgery;  Laterality: N/A;    There were no vitals filed for this visit.  Visit Diagnosis:  Weakness generalized  Abnormality of gait  Activity intolerance  Difficulty walking  Breast cancer metastasized to brain, unspecified laterality      Subjective Assessment - 08/27/14 1549    Subjective All over pt feeling good, at end of day feeling fatique and unsteadiness of gait at end of work day   Currently in Pain? No/denies   Multiple Pain Sites No                         [facility] Adult PT Treatment/Exercise - 08/27/14 0001    Lumbar Exercises: Aerobic   Stationary Bike Nu-step L2 x 49mn seat#9, Arm # 8   Elliptical L3/ R3 x 4 min    UBE (Upper Arm Bike) Level 1 sitting x 6 minutes (3/3)   Lumbar Exercises: Standing   Row Strengthening;Both  25# with abdominal bracing   Knee/Hip Exercises: Machines for Strengthening   Cybex Leg Press St6 65# 2 x 10, 60 1x10,single leg 35# 3x10 each rest break in between sets    Knee/Hip Exercises: Standing   Walking with Sports Cord  20# backward/reverse, 10# forward/reverse, supervision required x10                  PT Short Term Goals - 08/17/14 1556    PT SHORT TERM GOAL #1   Title be independent in initial HEP   Time 4   Period Weeks   Status Achieved   PT SHORT TERM GOAL #2   Title improve strength and safety to allow to wean from walker to cane 50% of the time in the home   Time 4   Period Weeks   Status Achieved   PT SHORT TERM GOAL #3   Title walk for 25 minutes with walker without significant fatigue   Time 4   Period Weeks   Status Achieved           PT Long Term Goals - 08/24/14 1557    PT  LONG TERM GOAL #1   Title be independent in advanced HEP   Time 6   Period Weeks   Status On-going   PT LONG TERM GOAL #2   Title wean from walker to use of cane at least 50% in the community   Time 8   Period Weeks   Status Achieved   PT LONG TERM GOAL #3   Title demonstrate heel to toe gait pattern on the Lt with level surface gait at least 50% of the time   Time 6   Period Weeks   Status Achieved   PT LONG TERM GOAL #4   Title improve endurance to walk for 25 minutes with use of cane in the community   Time 8   Period Weeks   Status Achieved   PT LONG TERM GOAL #5   Title demonstrate 4+/5 Lt hip flexor and DF strength to improve safety with gait   Time 6   Period Weeks   Status Partially Met   PT LONG TERM GOAL #6   Title Wean from cane use at least 50% of the time for community ambulation    Time 6   Period Weeks   Status On-going               Plan - 08/27/14 1604    Clinical Impression Statement Pt continues to use single point cane for ambulation. Pt will continue to benefit from skilled PT to improve UE/LE strength, balance, stamina   Pt will benefit from skilled therapeutic intervention in order to improve on the following deficits Abnormal gait;Difficulty walking;Decreased endurance;Decreased activity tolerance;Decreased strength;Decreased balance   Rehab Potential Good   PT Frequency 2x / week   PT Duration 8 weeks   PT Treatment/Interventions ADLs/Self Care Home Management;Therapeutic activities;Patient/family education;Therapeutic exercise;Manual techniques;Gait training;Cryotherapy;Neuromuscular re-education;Electrical Stimulation;Functional mobility training;Energy conservation   PT Next Visit Plan Renewal to extend for 5 weeks to continue with strengt, balance and endurance   Consulted and Agree with Plan of Care Patient        Problem List Patient Active Problem List   Diagnosis Date Noted  . Oral thrush 06/23/2014  . Fever 06/17/2014  .  Hypokalemia 06/17/2014  . Breast cancer metastasized to brain   . 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 Malnutrition of moderate degree 04/13/2014  . Myelopathy 04/12/2014  . Pathologic fracture of thoracic vertebrae 04/12/2014  . Biliary colic 92/11/9415  . Breast cancer of upper-outer quadrant of right female breast 09/24/2013  . S/P total hysterectomy and bilateral salpingo-oophorectomy 12/23/2012  . History of chemotherapy   . Hx of  radiation therapy     NAUMANN-HOUEGNIFIO,Melvina Pangelinan PTA 08/27/2014, 4:15 PM  [facility] [address] [place], [place], [address] Phone: [phone]   Fax:  [phone]

## 2014-08-28 ENCOUNTER — Telehealth: Payer: Self-pay | Admitting: *Deleted

## 2014-08-28 NOTE — Telephone Encounter (Signed)
WOULD LIKE LAB RESULTS CONCERNING HER IBRANCE.

## 2014-08-28 NOTE — Telephone Encounter (Signed)
This RN spoke with pt and reviewed labs with in range for restart of Ibrance at lowered dose of 100mg .  Pt will pick up medication on Monday and restart at that time.

## 2014-09-02 ENCOUNTER — Other Ambulatory Visit (HOSPITAL_BASED_OUTPATIENT_CLINIC_OR_DEPARTMENT_OTHER): Payer: BC Managed Care – PPO

## 2014-09-02 DIAGNOSIS — C50811 Malignant neoplasm of overlapping sites of right female breast: Secondary | ICD-10-CM

## 2014-09-02 DIAGNOSIS — C50411 Malignant neoplasm of upper-outer quadrant of right female breast: Secondary | ICD-10-CM

## 2014-09-02 LAB — CBC WITH DIFFERENTIAL/PLATELET
BASO%: 0.4 % (ref 0.0–2.0)
Basophils Absolute: 0 10*3/uL (ref 0.0–0.1)
EOS%: 4.6 % (ref 0.0–7.0)
Eosinophils Absolute: 0.2 10*3/uL (ref 0.0–0.5)
HEMATOCRIT: 31.8 % — AB (ref 34.8–46.6)
HGB: 10.8 g/dL — ABNORMAL LOW (ref 11.6–15.9)
LYMPH%: 16 % (ref 14.0–49.7)
MCH: 31.9 pg (ref 25.1–34.0)
MCHC: 34 g/dL (ref 31.5–36.0)
MCV: 93.8 fL (ref 79.5–101.0)
MONO#: 0.3 10*3/uL (ref 0.1–0.9)
MONO%: 5.8 % (ref 0.0–14.0)
NEUT%: 73.2 % (ref 38.4–76.8)
NEUTROS ABS: 3.7 10*3/uL (ref 1.5–6.5)
PLATELETS: 196 10*3/uL (ref 145–400)
RBC: 3.39 10*6/uL — AB (ref 3.70–5.45)
RDW: 13.2 % (ref 11.2–14.5)
WBC: 5 10*3/uL (ref 3.9–10.3)
lymph#: 0.8 10*3/uL — ABNORMAL LOW (ref 0.9–3.3)

## 2014-09-02 LAB — COMPREHENSIVE METABOLIC PANEL (CC13)
ALT: 15 U/L (ref 0–55)
ANION GAP: 9 meq/L (ref 3–11)
AST: 19 U/L (ref 5–34)
Albumin: 3.8 g/dL (ref 3.5–5.0)
Alkaline Phosphatase: 90 U/L (ref 40–150)
BILIRUBIN TOTAL: 0.28 mg/dL (ref 0.20–1.20)
BUN: 9.3 mg/dL (ref 7.0–26.0)
CALCIUM: 9.5 mg/dL (ref 8.4–10.4)
CO2: 27 mEq/L (ref 22–29)
Chloride: 108 mEq/L (ref 98–109)
Creatinine: 0.8 mg/dL (ref 0.6–1.1)
Glucose: 87 mg/dl (ref 70–140)
Potassium: 4 mEq/L (ref 3.5–5.1)
Sodium: 143 mEq/L (ref 136–145)
TOTAL PROTEIN: 6.9 g/dL (ref 6.4–8.3)

## 2014-09-07 ENCOUNTER — Ambulatory Visit: Payer: BC Managed Care – PPO | Admitting: Physical Therapy

## 2014-09-07 ENCOUNTER — Encounter: Payer: Self-pay | Admitting: Physical Therapy

## 2014-09-07 DIAGNOSIS — C50919 Malignant neoplasm of unspecified site of unspecified female breast: Secondary | ICD-10-CM

## 2014-09-07 DIAGNOSIS — R269 Unspecified abnormalities of gait and mobility: Secondary | ICD-10-CM

## 2014-09-07 DIAGNOSIS — R6889 Other general symptoms and signs: Secondary | ICD-10-CM

## 2014-09-07 DIAGNOSIS — C7931 Secondary malignant neoplasm of brain: Secondary | ICD-10-CM

## 2014-09-07 DIAGNOSIS — R262 Difficulty in walking, not elsewhere classified: Secondary | ICD-10-CM

## 2014-09-07 DIAGNOSIS — R531 Weakness: Secondary | ICD-10-CM

## 2014-09-07 NOTE — Therapy (Signed)
[facility] [address], [place], [place], [address] Phone: [phone]   Fax:  [phone]  Physical Therapy Treatment  Patient Details  Name: Carolyn [name] MRN: [id number] Date of Birth: 1970/03/01 Referring Provider:  Meredith Staggers, MD  Encounter Date: 09/07/2014      PT End of Session - 09/07/14 0901    Visit Number 24   Date for PT Re-Evaluation 10/02/14   PT Start Time 0848   PT Stop Time 0930   PT Time Calculation (min) 42 min   Activity Tolerance Patient tolerated treatment well   Behavior During Therapy [facility] for tasks assessed/performed      Past Medical History  Diagnosis Date  . 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 Hx of radiation therapy 11/08/11 -12/26/11    right chest wall/supraclav fossa, right scar  . History of chemotherapy 06/27/11 -09/04/11    neoadjuvant  . S/P radiation therapy 05/08/14    SRS brain    Past Surgical History  Procedure Laterality Date  . Wisdom tooth extraction    . Portacath placement  06/20/2011    Procedure: INSERTION PORT-A-CATH;  Surgeon: Haywood Lasso, MD;  Location: [place];  Service: General;  Laterality: N/A;  . Modified mastectomy  10/03/2011    Procedure: MODIFIED MASTECTOMY;  Surgeon: Haywood Lasso, MD;  Location: [place];  Service: General;  Laterality: Right;  . Port-a-cath removal  01/30/2012    Procedure: MINOR REMOVAL PORT-A-CATH;  Surgeon: Haywood Lasso, MD;  Location: [place];  Service: General;  Laterality: Left;  . Breast biopsy      Left  . Robotic assisted total hysterectomy with bilateral salpingo oopherectomy Bilateral 12/23/2012    Procedure: ROBOTIC ASSISTED TOTAL HYSTERECTOMY WITH BILATERAL SALPINGO OOPHORECTOMY;  Surgeon: Marvene Staff, MD;  Location: [facility];  Service: Gynecology;  Laterality: Bilateral;  . Cholecystectomy N/A 03/01/2014     Procedure: LAPAROSCOPIC CHOLECYSTECTOMY;  Surgeon: Leighton Ruff, MD;  [facility];  Service: General;  Laterality: N/A;  . Laminectomy N/A 04/13/2014    Procedure: THORACIC LAMINECTOMY WITH FIXATION THORACIC SIX-THORACIC TEN FUSION;  Surgeon: Kristeen Miss, MD;  Location: [place] [facility];  Service: Neurosurgery;  Laterality: N/A;    There were no vitals filed for this visit.  Visit Diagnosis:  Weakness generalized  Abnormality of gait  Activity intolerance  Difficulty walking  Breast cancer metastasized to brain, unspecified laterality      Subjective Assessment - 09/07/14 0851    Subjective Not too bad this AM.   Currently in Pain? No/denies   Multiple Pain Sites No                         [facility] Adult PT Treatment/Exercise - 09/07/14 0001    Lumbar Exercises: Aerobic   Stationary Bike Nu step L3 x 10 min   Elliptical L4/R4 x 5   UBE (Upper Arm Bike) Level 1 sitting x 6 minutes (3/3)   Lumbar Exercises: Standing   Row Strengthening;Both  3x 10 VC for posture/TA   Knee/Hip Exercises: Standing   Walking with Sports Cord 20# fwd/bkwrd 10x                  PT Short Term Goals - 08/17/14 1556    PT SHORT TERM GOAL #1   Title be independent in initial HEP   Time  4   Period Weeks   Status Achieved   PT SHORT TERM GOAL #2   Title improve strength and safety to allow to wean from walker to cane 50% of the time in the home   Time 4   Period Weeks   Status Achieved   PT SHORT TERM GOAL #3   Title walk for 25 minutes with walker without significant fatigue   Time 4   Period Weeks   Status Achieved           PT Long Term Goals - 09/07/14 2536    PT LONG TERM GOAL #6   Title Wean from cane use at least 50% of the time for community ambulation    Time 6   Period Weeks   Status Achieved  40%               Plan - 09/07/14 0919    Clinical Impression Statement Using cane only 40% of the time now, meeting LTG. Pt tolerated all  increases in load and duration today in treatment.    Pt will benefit from skilled therapeutic intervention in order to improve on the following deficits Abnormal gait;Difficulty walking;Decreased endurance;Decreased activity tolerance;Decreased strength;Decreased balance   Rehab Potential Good   PT Frequency 2x / week   PT Duration 8 weeks   PT Treatment/Interventions ADLs/Self Care Home Management;Therapeutic activities;Patient/family education;Therapeutic exercise;Manual techniques;Gait training;Cryotherapy;Neuromuscular re-education;Electrical Stimulation;Functional mobility training;Energy conservation   PT Next Visit Plan Continue with ankle DF and hip flexor strenght specifically   Consulted and Agree with Plan of Care Patient        Problem List Patient Active Problem List   Diagnosis Date Noted  . Oral thrush 06/23/2014  . Fever 06/17/2014  . Hypokalemia 06/17/2014  . Breast cancer metastasized to brain   . 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 Malnutrition of moderate degree 04/13/2014  . Myelopathy 04/12/2014  . Pathologic fracture of thoracic vertebrae 04/12/2014  . Biliary colic 64/40/3474  . Breast cancer of upper-outer quadrant of right female breast 09/24/2013  . S/P total hysterectomy and bilateral salpingo-oophorectomy 12/23/2012  . History of chemotherapy   . Hx of radiation therapy     Travas Schexnayder, PTA 09/07/2014, 9:21 AM  [facility] [address], [place], [place], [address] Phone: [phone]   Fax:  [phone]

## 2014-09-09 ENCOUNTER — Other Ambulatory Visit (HOSPITAL_BASED_OUTPATIENT_CLINIC_OR_DEPARTMENT_OTHER): Payer: BC Managed Care – PPO

## 2014-09-09 DIAGNOSIS — C50811 Malignant neoplasm of overlapping sites of right female breast: Secondary | ICD-10-CM

## 2014-09-09 DIAGNOSIS — C50411 Malignant neoplasm of upper-outer quadrant of right female breast: Secondary | ICD-10-CM

## 2014-09-09 LAB — CBC WITH DIFFERENTIAL/PLATELET
BASO%: 0.5 % (ref 0.0–2.0)
Basophils Absolute: 0 10*3/uL (ref 0.0–0.1)
EOS ABS: 0.1 10*3/uL (ref 0.0–0.5)
EOS%: 2.5 % (ref 0.0–7.0)
HEMATOCRIT: 32.3 % — AB (ref 34.8–46.6)
HEMOGLOBIN: 10.8 g/dL — AB (ref 11.6–15.9)
LYMPH#: 0.6 10*3/uL — AB (ref 0.9–3.3)
LYMPH%: 14.9 % (ref 14.0–49.7)
MCH: 31.9 pg (ref 25.1–34.0)
MCHC: 33.6 g/dL (ref 31.5–36.0)
MCV: 94.8 fL (ref 79.5–101.0)
MONO#: 0.1 10*3/uL (ref 0.1–0.9)
MONO%: 2.4 % (ref 0.0–14.0)
NEUT#: 3.2 10*3/uL (ref 1.5–6.5)
NEUT%: 79.7 % — ABNORMAL HIGH (ref 38.4–76.8)
PLATELETS: 193 10*3/uL (ref 145–400)
RBC: 3.4 10*6/uL — ABNORMAL LOW (ref 3.70–5.45)
RDW: 14.5 % (ref 11.2–14.5)
WBC: 4 10*3/uL (ref 3.9–10.3)

## 2014-09-09 LAB — COMPREHENSIVE METABOLIC PANEL (CC13)
ALBUMIN: 3.8 g/dL (ref 3.5–5.0)
ALK PHOS: 95 U/L (ref 40–150)
ALT: 8 U/L (ref 0–55)
ANION GAP: 8 meq/L (ref 3–11)
AST: 17 U/L (ref 5–34)
BILIRUBIN TOTAL: 0.27 mg/dL (ref 0.20–1.20)
BUN: 13 mg/dL (ref 7.0–26.0)
CO2: 29 mEq/L (ref 22–29)
CREATININE: 0.9 mg/dL (ref 0.6–1.1)
Calcium: 9.8 mg/dL (ref 8.4–10.4)
Chloride: 107 mEq/L (ref 98–109)
EGFR: 89 mL/min/{1.73_m2} — AB (ref >90)
Glucose: 89 mg/dl (ref 70–140)
Potassium: 4.4 mEq/L (ref 3.5–5.1)
Sodium: 144 mEq/L (ref 136–145)
Total Protein: 7 g/dL (ref 6.4–8.3)

## 2014-09-15 ENCOUNTER — Ambulatory Visit: Payer: BC Managed Care – PPO | Attending: Physical Medicine & Rehabilitation

## 2014-09-15 DIAGNOSIS — R6889 Other general symptoms and signs: Secondary | ICD-10-CM

## 2014-09-15 DIAGNOSIS — R531 Weakness: Secondary | ICD-10-CM | POA: Diagnosis not present

## 2014-09-15 NOTE — Therapy (Signed)
[facility] [address], [place], [place], [address] Phone: [phone]   Fax:  [phone]  Physical Therapy Treatment  Patient Details  Name: Carolyn [name] MRN: [id number] Date of Birth: June 16, 1970 Referring Provider:  Meredith Staggers, MD  Encounter Date: 09/15/2014      PT End of Session - 09/15/14 0922    Visit Number 25   Date for PT Re-Evaluation 10/02/14   PT Start Time 0845   PT Stop Time 0924   PT Time Calculation (min) 39 min   Activity Tolerance Patient tolerated treatment well   Behavior During Therapy [facility] for tasks assessed/performed      Past Medical History  Diagnosis Date  . 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 Hx of radiation therapy 11/08/11 -12/26/11    right chest wall/supraclav fossa, right scar  . History of chemotherapy 06/27/11 -09/04/11    neoadjuvant  . S/P radiation therapy 05/08/14    SRS brain    Past Surgical History  Procedure Laterality Date  . Wisdom tooth extraction    . Portacath placement  06/20/2011    Procedure: INSERTION PORT-A-CATH;  Surgeon: Haywood Lasso, MD;  Location: [place];  Service: General;  Laterality: N/A;  . Modified mastectomy  10/03/2011    Procedure: MODIFIED MASTECTOMY;  Surgeon: Haywood Lasso, MD;  Location: [place];  Service: General;  Laterality: Right;  . Port-a-cath removal  01/30/2012    Procedure: MINOR REMOVAL PORT-A-CATH;  Surgeon: Haywood Lasso, MD;  Location: [place];  Service: General;  Laterality: Left;  . Breast biopsy      Left  . Robotic assisted total hysterectomy with bilateral salpingo oopherectomy Bilateral 12/23/2012    Procedure: ROBOTIC ASSISTED TOTAL HYSTERECTOMY WITH BILATERAL SALPINGO OOPHORECTOMY;  Surgeon: Marvene Staff, MD;  Location: [place] [facility];  Service: Gynecology;  Laterality: Bilateral;  . Cholecystectomy N/A 03/01/2014    Procedure: LAPAROSCOPIC CHOLECYSTECTOMY;  Surgeon: Leighton Ruff, MD;  Location: [facility];  Service: General;  Laterality: N/A;  . Laminectomy N/A 04/13/2014    Procedure: THORACIC LAMINECTOMY WITH FIXATION THORACIC SIX-THORACIC TEN FUSION;  Surgeon: Kristeen Miss, MD;  Location: [place] [facility];  Service: Neurosurgery;  Laterality: N/A;    There were no vitals filed for this visit.  Visit Diagnosis:  Weakness generalized  Activity intolerance      Subjective Assessment - 09/15/14 0849    Currently in Pain? No/denies                         [facility] Adult PT Treatment/Exercise - 09/15/14 0001    Lumbar Exercises: Aerobic   Stationary Bike Nu step L3 x 8 min   Elliptical L4/R4 x 5   UBE (Upper Arm Bike) Level 2 sitting x 6 minutes (3/3)   Lumbar Exercises: Standing   Row Strengthening;Both  3x 10 VC for posture/TA   Row Limitations 25#   Knee/Hip Exercises: Standing   Walking with Sports Cord 20# fwd/bkwrd 10x each, 15# sidestepping x 10 each                  PT Short Term Goals - 08/17/14 1556    PT SHORT TERM GOAL #1   Title be independent in initial HEP   Time 4   Period Weeks   Status Achieved   PT SHORT TERM GOAL #2   Title  improve strength and safety to allow to wean from walker to cane 50% of the time in the home   Time 4   Period Weeks   Status Achieved   PT SHORT TERM GOAL #3   Title walk for 25 minutes with walker without significant fatigue   Time 4   Period Weeks   Status Achieved           PT Long Term Goals - 09/15/14 5638    PT LONG TERM GOAL #1   Title be independent in advanced HEP   Time 6   Period Weeks   Status On-going   PT LONG TERM GOAL #2   Title wean from walker to use of cane at least 50% in the community   Status Achieved  using cane in the community and at work   PT [place] #3   Title demonstrate heel to toe gait pattern on the Lt with level surface gait at least 50% of the time   Status Achieved   PT  LONG TERM GOAL #4   Title improve endurance to walk for 25 minutes with use of cane in the community   Status Achieved   PT LONG TERM GOAL #5   Status On-going   PT LONG TERM GOAL #6   Title Wean from cane use at least 50% of the time for community ambulation    Time 6   Period Weeks   Status Partially Met  using 40% in the comminty and at work               Plan - 09/15/14 0856    Clinical Impression Statement Pt with continued endurance and strength deficits secondary to cancer.  Pt with increased difficulty with sidestepping with resistance today. Pt able to do arm bike on level 2 today.  Endurance is improving and pt is able to walk longer periods without rest and is weaning from cane due to increased balance/stability in the community.  Pt reports limitation and difficulty with ascending inclines in the community.  Pt will benfiit fro skilled PT for safe advancement of endurance and strength exercises.     Pt will benefit from skilled therapeutic intervention in order to improve on the following deficits Abnormal gait;Difficulty walking;Decreased endurance;Decreased activity tolerance;Decreased strength;Decreased balance   Rehab Potential Good   PT Frequency 2x / week   PT Duration 8 weeks   PT Treatment/Interventions ADLs/Self Care Home Management;Therapeutic activities;Patient/family education;Therapeutic exercise;Manual techniques;Gait training;Cryotherapy;Neuromuscular re-education;Electrical Stimulation;Functional mobility training;Energy conservation   PT Next Visit Plan Stregnth, endurance, ankle strength, balance exercises.     Consulted and Agree with Plan of Care Patient        Problem List Patient Active Problem List   Diagnosis Date Noted  . Oral thrush 06/23/2014  . Fever 06/17/2014  . Hypokalemia 06/17/2014  . Breast cancer metastasized to brain   . 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 Malnutrition of moderate degree 04/13/2014  . Myelopathy  04/12/2014  . Pathologic fracture of thoracic vertebrae 04/12/2014  . Biliary colic 75/64/3329  . Breast cancer of upper-outer quadrant of right female breast 09/24/2013  . S/P total hysterectomy and bilateral salpingo-oophorectomy 12/23/2012  . History of chemotherapy   . Hx of radiation therapy     Delita Chiquito, PT 09/15/2014, 9:23 AM  [facility] [address], [place], [place], [address] Phone: [phone]   Fax:  [phone]

## 2014-09-16 ENCOUNTER — Other Ambulatory Visit (HOSPITAL_BASED_OUTPATIENT_CLINIC_OR_DEPARTMENT_OTHER): Payer: BC Managed Care – PPO

## 2014-09-16 DIAGNOSIS — C50811 Malignant neoplasm of overlapping sites of right female breast: Secondary | ICD-10-CM | POA: Diagnosis not present

## 2014-09-16 DIAGNOSIS — C50411 Malignant neoplasm of upper-outer quadrant of right female breast: Secondary | ICD-10-CM

## 2014-09-16 LAB — COMPREHENSIVE METABOLIC PANEL (CC13)
ALBUMIN: 4 g/dL (ref 3.5–5.0)
ALK PHOS: 90 U/L (ref 40–150)
ALT: 10 U/L (ref 0–55)
AST: 17 U/L (ref 5–34)
Anion Gap: 8 mEq/L (ref 3–11)
BUN: 12.2 mg/dL (ref 7.0–26.0)
CALCIUM: 9.7 mg/dL (ref 8.4–10.4)
CO2: 29 mEq/L (ref 22–29)
Chloride: 107 mEq/L (ref 98–109)
Creatinine: 0.9 mg/dL (ref 0.6–1.1)
EGFR: 85 mL/min/{1.73_m2} — AB (ref >90)
Glucose: 86 mg/dl (ref 70–140)
POTASSIUM: 4 meq/L (ref 3.5–5.1)
Sodium: 143 mEq/L (ref 136–145)
Total Bilirubin: 0.27 mg/dL (ref 0.20–1.20)
Total Protein: 7.3 g/dL (ref 6.4–8.3)

## 2014-09-16 LAB — CBC WITH DIFFERENTIAL/PLATELET
BASO%: 0.5 % (ref 0.0–2.0)
BASOS ABS: 0 10*3/uL (ref 0.0–0.1)
EOS ABS: 0 10*3/uL (ref 0.0–0.5)
EOS%: 2 % (ref 0.0–7.0)
HEMATOCRIT: 33.4 % — AB (ref 34.8–46.6)
HEMOGLOBIN: 11.2 g/dL — AB (ref 11.6–15.9)
LYMPH#: 0.6 10*3/uL — AB (ref 0.9–3.3)
LYMPH%: 25 % (ref 14.0–49.7)
MCH: 31.5 pg (ref 25.1–34.0)
MCHC: 33.7 g/dL (ref 31.5–36.0)
MCV: 93.6 fL (ref 79.5–101.0)
MONO#: 0.1 10*3/uL (ref 0.1–0.9)
MONO%: 3.5 % (ref 0.0–14.0)
NEUT#: 1.6 10*3/uL (ref 1.5–6.5)
NEUT%: 69 % (ref 38.4–76.8)
Platelets: 151 10*3/uL (ref 145–400)
RBC: 3.56 10*6/uL — ABNORMAL LOW (ref 3.70–5.45)
RDW: 14.6 % — AB (ref 11.2–14.5)
WBC: 2.3 10*3/uL — ABNORMAL LOW (ref 3.9–10.3)

## 2014-09-21 ENCOUNTER — Ambulatory Visit: Payer: BC Managed Care – PPO

## 2014-09-21 DIAGNOSIS — R531 Weakness: Secondary | ICD-10-CM | POA: Diagnosis not present

## 2014-09-21 DIAGNOSIS — R6889 Other general symptoms and signs: Secondary | ICD-10-CM

## 2014-09-21 NOTE — Therapy (Signed)
[facility] [address], [place], [place], [address] Phone: [phone]   Fax:  [phone]  Physical Therapy Treatment  Patient Details  Name: Carolyn [name] MRN: [id number] Date of Birth: April 18, 1970 Referring Provider:  Meredith Staggers, MD  Encounter Date: 09/21/2014      PT End of Session - 09/21/14 1008    Visit Number 26   Date for PT Re-Evaluation 10/02/14   PT Start Time 0931   PT Stop Time 1012   PT Time Calculation (min) 41 min   Activity Tolerance Patient tolerated treatment well   Behavior During Therapy [facility] for tasks assessed/performed      Past Medical History  Diagnosis Date  . 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 Hx of radiation therapy 11/08/11 -12/26/11    right chest wall/supraclav fossa, right scar  . History of chemotherapy 06/27/11 -09/04/11    neoadjuvant  . S/P radiation therapy 05/08/14    SRS brain    Past Surgical History  Procedure Laterality Date  . Wisdom tooth extraction    . Portacath placement  06/20/2011    Procedure: INSERTION PORT-A-CATH;  Surgeon: Haywood Lasso, MD;  Location: [place];  Service: General;  Laterality: N/A;  . Modified mastectomy  10/03/2011    Procedure: MODIFIED MASTECTOMY;  Surgeon: Haywood Lasso, MD;  Location: [place];  Service: General;  Laterality: Right;  . Port-a-cath removal  01/30/2012    Procedure: MINOR REMOVAL PORT-A-CATH;  Surgeon: Haywood Lasso, MD;  Location: [place];  Service: General;  Laterality: Left;  . Breast biopsy      Left  . Robotic assisted total hysterectomy with bilateral salpingo oopherectomy Bilateral 12/23/2012    Procedure: ROBOTIC ASSISTED TOTAL HYSTERECTOMY WITH BILATERAL SALPINGO OOPHORECTOMY;  Surgeon: Marvene Staff, MD;  Location: [facility] [facility];  Service: Gynecology;  Laterality: Bilateral;  . Cholecystectomy N/A 03/01/2014     Procedure: LAPAROSCOPIC CHOLECYSTECTOMY;  Surgeon: Leighton Ruff, MD;  [facility];  Service: General;  Laterality: N/A;  . Laminectomy N/A 04/13/2014    Procedure: THORACIC LAMINECTOMY WITH FIXATION THORACIC SIX-THORACIC TEN FUSION;  Surgeon: Kristeen Miss, MD;  Location: [place] [facility];  Service: Neurosurgery;  Laterality: N/A;    There were no vitals filed for this visit.  Visit Diagnosis:  Weakness generalized  Activity intolerance      Subjective Assessment - 09/21/14 0935    Subjective Pt had a good weekend.     Currently in Pain? No/denies                         [facility] Adult PT Treatment/Exercise - 09/21/14 0001    Lumbar Exercises: Aerobic   Stationary Bike Nu step L3 x 8 min  seat 9, arms 10   Elliptical L4/R4 x 5   UBE (Upper Arm Bike) Level 2 sitting x 6 minutes (3/3)   Lumbar Exercises: Standing   Row Strengthening;Both  3x 10 VC for posture/TA   Row Limitations 25#   Knee/Hip Exercises: Standing   Walking with Sports Cord 20# fwd/bkwrd 10x each, 15# sidestepping x 10 each                  PT Short Term Goals - 08/17/14 1556    PT SHORT TERM GOAL #1   Title be independent in initial HEP   Time 4  Period Weeks   Status Achieved   PT SHORT TERM GOAL #2   Title improve strength and safety to allow to wean from walker to cane 50% of the time in the home   Time 4   Period Weeks   Status Achieved   PT SHORT TERM GOAL #3   Title walk for 25 minutes with walker without significant fatigue   Time 4   Period Weeks   Status Achieved           PT Long Term Goals - 09/21/14 0936    PT LONG TERM GOAL #1   Title be independent in advanced HEP   Time 6   Period Weeks   Status On-going  Pt independnet in current HEP   PT LONG TERM GOAL #2   Title wean from walker to use of cane at least 50% in the community   Status Achieved   PT LONG TERM GOAL #3   Title demonstrate heel to toe gait pattern on the Lt with level surface gait  at least 50% of the time   Status Achieved   PT LONG TERM GOAL #6   Title Wean from cane use at least 50% of the time for community ambulation    Time 6   Period Weeks   Status Partially Met  still uses for safety               Plan - 09/21/14 0942    Clinical Impression Statement Pt with improving endurance reports each session.  Pt goes to the gym 3x/wk for cardio and strength exercise.  Pt tolerates increased resistance with exercise in the clinic.  Pt did experience fatigue with arm bike today at 4.5 minutes.  Pt is using cane 40% of the time for safety especially at work.  Pt will benefit from skilled PT for continued progression of exercise for strength and endurance gains.     Pt will benefit from skilled therapeutic intervention in order to improve on the following deficits Abnormal gait;Difficulty walking;Decreased endurance;Decreased activity tolerance;Decreased strength;Decreased balance   Rehab Potential Good   PT Frequency 2x / week   PT Duration 8 weeks   PT Treatment/Interventions ADLs/Self Care Home Management;Therapeutic activities;Patient/family education;Therapeutic exercise;Manual techniques;Gait training;Cryotherapy;Neuromuscular re-education;Electrical Stimulation;Functional mobility training;Energy conservation   PT Next Visit Plan Stregnth, endurance, ankle strength, balance exercises.     Consulted and Agree with Plan of Care Patient        Problem List Patient Active Problem List   Diagnosis Date Noted  . Oral thrush 06/23/2014  . Fever 06/17/2014  . Hypokalemia 06/17/2014  . Breast cancer metastasized to brain   . 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 Malnutrition of moderate degree 04/13/2014  . Myelopathy 04/12/2014  . Pathologic fracture of thoracic vertebrae 04/12/2014  . Biliary colic 01/17/3233  . Breast cancer of upper-outer quadrant of right female breast 09/24/2013  . S/P total hysterectomy and bilateral salpingo-oophorectomy  12/23/2012  . History of chemotherapy   . Hx of radiation therapy     TAKACS,KELLY, PT 09/21/2014, 10:10 AM  [facility] [address] [place], [place], [address] Phone: [phone]   Fax:  [phone]

## 2014-09-23 ENCOUNTER — Other Ambulatory Visit (HOSPITAL_BASED_OUTPATIENT_CLINIC_OR_DEPARTMENT_OTHER): Payer: BC Managed Care – PPO

## 2014-09-23 DIAGNOSIS — C50811 Malignant neoplasm of overlapping sites of right female breast: Secondary | ICD-10-CM | POA: Diagnosis not present

## 2014-09-23 DIAGNOSIS — C50411 Malignant neoplasm of upper-outer quadrant of right female breast: Secondary | ICD-10-CM

## 2014-09-23 LAB — COMPREHENSIVE METABOLIC PANEL (CC13)
ALBUMIN: 3.9 g/dL (ref 3.5–5.0)
ALK PHOS: 84 U/L (ref 40–150)
ALT: 11 U/L (ref 0–55)
AST: 15 U/L (ref 5–34)
Anion Gap: 8 mEq/L (ref 3–11)
BILIRUBIN TOTAL: 0.31 mg/dL (ref 0.20–1.20)
BUN: 13.8 mg/dL (ref 7.0–26.0)
CO2: 29 mEq/L (ref 22–29)
CREATININE: 0.9 mg/dL (ref 0.6–1.1)
Calcium: 9.6 mg/dL (ref 8.4–10.4)
Chloride: 107 mEq/L (ref 98–109)
EGFR: 86 mL/min/{1.73_m2} — ABNORMAL LOW (ref >90)
GLUCOSE: 91 mg/dL (ref 70–140)
Potassium: 4.2 mEq/L (ref 3.5–5.1)
SODIUM: 144 meq/L (ref 136–145)
TOTAL PROTEIN: 7.1 g/dL (ref 6.4–8.3)

## 2014-09-23 LAB — CBC WITH DIFFERENTIAL/PLATELET
BASO%: 0.5 % (ref 0.0–2.0)
BASOS ABS: 0 10*3/uL (ref 0.0–0.1)
EOS%: 0.5 % (ref 0.0–7.0)
Eosinophils Absolute: 0 10*3/uL (ref 0.0–0.5)
HCT: 30.9 % — ABNORMAL LOW (ref 34.8–46.6)
HEMOGLOBIN: 10.7 g/dL — AB (ref 11.6–15.9)
LYMPH#: 0.5 10*3/uL — AB (ref 0.9–3.3)
LYMPH%: 25.9 % (ref 14.0–49.7)
MCH: 32.1 pg (ref 25.1–34.0)
MCHC: 34.6 g/dL (ref 31.5–36.0)
MCV: 92.8 fL (ref 79.5–101.0)
MONO#: 0.1 10*3/uL (ref 0.1–0.9)
MONO%: 4.1 % (ref 0.0–14.0)
NEUT#: 1.4 10*3/uL — ABNORMAL LOW (ref 1.5–6.5)
NEUT%: 69 % (ref 38.4–76.8)
Platelets: 90 10*3/uL — ABNORMAL LOW (ref 145–400)
RBC: 3.33 10*6/uL — ABNORMAL LOW (ref 3.70–5.45)
RDW: 13.7 % (ref 11.2–14.5)
WBC: 2 10*3/uL — AB (ref 3.9–10.3)

## 2014-09-25 ENCOUNTER — Ambulatory Visit
Admission: RE | Admit: 2014-09-25 | Discharge: 2014-09-25 | Disposition: A | Discharge status: Home / Self Care | Payer: BC Managed Care – PPO | Source: Ambulatory Visit | Attending: Radiation Oncology | Admitting: Radiation Oncology

## 2014-09-25 DIAGNOSIS — C7931 Secondary malignant neoplasm of brain: Secondary | ICD-10-CM

## 2014-09-25 MED ORDER — GADOBENATE DIMEGLUMINE 529 MG/ML IV SOLN
12.0000 mL | Freq: Once | INTRAVENOUS | Status: AC | PRN
  Administered 2014-09-25: 12 mL via INTRAVENOUS

## 2014-09-28 ENCOUNTER — Ambulatory Visit
Admission: RE | Admit: 2014-09-28 | Discharge: 2014-09-28 | Disposition: A | Discharge status: Home / Self Care | Payer: BC Managed Care – PPO | Source: Ambulatory Visit | Attending: Radiation Oncology | Admitting: Radiation Oncology

## 2014-09-28 ENCOUNTER — Encounter: Payer: Self-pay | Admitting: Radiation Oncology

## 2014-09-28 ENCOUNTER — Ambulatory Visit: Payer: BC Managed Care – PPO

## 2014-09-28 VITALS — BP 109/74 | HR 116 | Temp 98.4°F | Resp 20 | Ht 68.0 in | Wt 129.4 lb

## 2014-09-28 DIAGNOSIS — R6889 Other general symptoms and signs: Secondary | ICD-10-CM

## 2014-09-28 DIAGNOSIS — C7931 Secondary malignant neoplasm of brain: Principal | ICD-10-CM

## 2014-09-28 DIAGNOSIS — C50919 Malignant neoplasm of unspecified site of unspecified female breast: Secondary | ICD-10-CM

## 2014-09-28 DIAGNOSIS — R531 Weakness: Secondary | ICD-10-CM

## 2014-09-28 NOTE — Therapy (Signed)
[facility] [address], [place], [place], [address] Phone: [phone]   Fax:  [phone]  Physical Therapy Treatment  Patient Details  Name: Carolyn [name] MRN: [id number] Date of Birth: 07/17/1970 Referring Provider:  Meredith Staggers, MD  Encounter Date: 09/28/2014      PT End of Session - 09/28/14 1606    Visit Number 62   PT Start Time 7017   PT Stop Time 1610   PT Time Calculation (min) 39 min   Activity Tolerance Patient tolerated treatment well   Behavior During Therapy [facility] for tasks assessed/performed      Past Medical History  Diagnosis Date  . 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 Hx of radiation therapy 11/08/11 -12/26/11    right chest wall/supraclav fossa, right scar  . History of chemotherapy 06/27/11 -09/04/11    neoadjuvant  . S/P radiation therapy 05/08/14    SRS brain    Past Surgical History  Procedure Laterality Date  . Wisdom tooth extraction    . Portacath placement  06/20/2011    Procedure: INSERTION PORT-A-CATH;  Surgeon: Haywood Lasso, MD;  Location: [place];  Service: General;  Laterality: N/A;  . Modified mastectomy  10/03/2011    Procedure: MODIFIED MASTECTOMY;  Surgeon: Haywood Lasso, MD;  Location: [place];  Service: General;  Laterality: Right;  . Port-a-cath removal  01/30/2012    Procedure: MINOR REMOVAL PORT-A-CATH;  Surgeon: Haywood Lasso, MD;  Location: [place];  Service: General;  Laterality: Left;  . Breast biopsy      Left  . Robotic assisted total hysterectomy with bilateral salpingo oopherectomy Bilateral 12/23/2012    Procedure: ROBOTIC ASSISTED TOTAL HYSTERECTOMY WITH BILATERAL SALPINGO OOPHORECTOMY;  Surgeon: Marvene Staff, MD;  Location: [facility];  Service: Gynecology;  Laterality: Bilateral;  . Cholecystectomy N/A 03/01/2014    Procedure: LAPAROSCOPIC  CHOLECYSTECTOMY;  Surgeon: Leighton Ruff, MD;  [facility];  Service: General;  Laterality: N/A;  . Laminectomy N/A 04/13/2014    Procedure: THORACIC LAMINECTOMY WITH FIXATION THORACIC SIX-THORACIC TEN FUSION;  Surgeon: Kristeen Miss, MD;  Location: [place] [facility];  Service: Neurosurgery;  Laterality: N/A;    There were no vitals filed for this visit.  Visit Diagnosis:  Weakness generalized  Activity intolerance      Subjective Assessment - 09/28/14 1533    Subjective Ready for discharge.  Pt exercises at the gym to help build endurance.     Currently in Pain? No/denies            [facility] PT Assessment - 09/28/14 0001    Assessment   Medical Diagnosis metastatic brease cancer to thoracic spine, myelopathy (G99.@)   Onset Date/Surgical Date 04/13/14   Strength   Overall Strength Within functional limits for tasks performed   Overall Strength Comments Lt hip flexion 4+/5, DF 4+/5, knee 5/5                     [facility] Adult PT Treatment/Exercise - 09/28/14 0001    Lumbar Exercises: Aerobic   Stationary Bike Nu step L3 x 8 min  seat 9, arms 10   Elliptical L4/R4 x 5   UBE (Upper Arm Bike) Level 2 sitting x 6 minutes (3/3)   Lumbar Exercises: Standing   Row Strengthening;Both  3x 10 VC for posture/TA   Row Limitations 25#   Knee/Hip Exercises:  Standing   Walking with Sports Cord 20# fwd/bkwrd 10x each, 15# sidestepping x 10 each                  PT Short Term Goals - 09/28/14 1536    PT SHORT TERM GOAL #1   Title be independent in initial HEP   Status Achieved           PT Long Term Goals - 09/28/14 1536    PT LONG TERM GOAL #1   Title be independent in advanced HEP   Status Achieved   PT LONG TERM GOAL #2   Title wean from walker to use of cane at least 50% in the community   Status Achieved   PT LONG TERM GOAL #3   Title demonstrate heel to toe gait pattern on the Lt with level surface gait at least 50% of the time   Status Achieved    PT LONG TERM GOAL #4   Title improve endurance to walk for 25 minutes with use of cane in the community   Status Achieved   PT LONG TERM GOAL #5   Title demonstrate 4+/5 Lt hip flexor and DF strength to improve safety with gait   Time 6   Period Weeks   Status Achieved   PT LONG TERM GOAL #6   Title Wean from cane use at least 50% of the time for community ambulation    Status Partially Met  still uses in the community for safety               Plan - 09/28/14 1538    Clinical Impression Statement Pt will be discharged to HEP for expected endurance and strength gains.  Pt goes to the gym 3x/wk for cardio and strength.  Pt with chronic fatigue related to cancer treatment.  Lt LE strength is 4+/5 throughout.  Pt has met all goals except she still uses cane in the community for safety.     PT Next Visit Plan D/C PT to HEP   Consulted and Agree with Plan of Care Patient        Problem List Patient Active Problem List   Diagnosis Date Noted  . Oral thrush 06/23/2014  . Fever 06/17/2014  . Hypokalemia 06/17/2014  . Breast cancer metastasized to brain   . 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 Malnutrition of moderate degree 04/13/2014  . Myelopathy 04/12/2014  . Pathologic fracture of thoracic vertebrae 04/12/2014  . Biliary colic 07/37/1062  . Breast cancer of upper-outer quadrant of right female breast 09/24/2013  . S/P total hysterectomy and bilateral salpingo-oophorectomy 12/23/2012  . History of chemotherapy   . Hx of radiation therapy   PHYSICAL THERAPY DISCHARGE SUMMARY  Visits from Start of Care: 27  Current functional level related to goals / functional outcomes: See above.   Remaining deficits: See above.  Pt with continued endurance and gait deficits due to chronic nature of condition.  Pt has HEP in place and will continue with theses exercise and gym exercises for continued gains.  Thank you for this referral.     Education / Equipment: HEP, gait  training Plan: Patient agrees to discharge.  Patient goals were met. Patient is being discharged due to meeting the stated rehab goals.  ?????      TAKACS,KELLY, PT 09/28/2014, 4:07 PM  [facility] [address], [place], [place], [address] Phone: [phone]   Fax:  [phone]

## 2014-09-28 NOTE — Progress Notes (Signed)
Radiation Oncology         [phone] ________________________________  Name: Carolyn [name] MRN: [id number]  Date: 09/28/2014  DOB: 10-31-1970  Follow-Up Visit Note  CC: [name], MD  [name], MD  Diagnosis: Brain metastasis  Interval Since Last Radiation:  5 months   Narrative: The patient returns today for routine follow-up. MRI 09/25/14 results in, here to discuss, no c/o pain, nausea, vision changes, or light headed, no dizziness, appetite good, energy level up and down, today last day for Physical Therapy for balance, walks with a cane.   ALLERGIES:  is allergic to allegra and shellfish allergy.  Meds: Current Outpatient Prescriptions  Medication Sig Dispense Refill  . Calcium Carbonate-Vit D-Min (CALCIUM 1200 PO) Take 1 tablet by mouth 2 (two) times daily.    . Cholecalciferol (VITAMIN D3) 2000 UNITS TABS Take 2,000 Units by mouth daily.    . Multiple Vitamin (MULTIVITAMIN) tablet Take 1 tablet by mouth daily.    . palbociclib (IBRANCE) 100 MG capsule Take 1 capsule (100 mg total) by mouth daily with breakfast. Take whole with food. Take daily for 21 days and then off for 7 days. 21 capsule 3  . acetaminophen (TYLENOL) 325 MG tablet Take 2 tablets (650 mg total) by mouth every 6 (six) hours as needed for mild pain, moderate pain, fever or headache. (Patient not taking: Reported on 06/24/2014)     No current facility-administered medications for this encounter.    Physical Findings: The patient is in no acute distress. Patient is alert and oriented.  height is 5\' 8"  (1.727 m) and weight is 129 lb 6.4 oz (58.695 kg). Her oral temperature is 98.4 F (36.9 C). Her blood pressure is 109/74 and her pulse is 116. Her respiration is 20 and oxygen saturation is 100%. .     Lab Findings: Lab Results  Component Value Date   WBC 2.0* 09/23/2014   HGB 10.7* 09/23/2014   HCT 30.9* 09/23/2014   MCV 92.8 09/23/2014   PLT 90* 09/23/2014     Radiographic Findings: Mr  Jeri Cos EX Contrast  09/25/2014   CLINICAL DATA:  SRS 3 month restaging.  Breast cancer.  EXAM: MRI HEAD WITHOUT AND WITH CONTRAST  TECHNIQUE: Multiplanar, multiecho pulse sequences of the brain and surrounding structures were obtained without and with intravenous contrast.  CONTRAST:  58mL MULTIHANCE GADOBENATE DIMEGLUMINE 529 MG/ML IV SOLN  COMPARISON:  06/26/2014 most recent.  FINDINGS: LEFT parietal subcortical metastasis is redemonstrated on today's exam, and displays increasing enhancement as well as worsening vasogenic edema. Measurements are 14 x 19 x 19 mm on postcontrast imaging. Vasogenic edema extends anteriorly to the LEFT lentiform nucleus, and posteriorly to involve more of the LEFT parietal white matter. No significant midline shift. Within the lesion, Central blood products are redemonstrated, along with mild peripheral diffusion restriction, similar to priors.  Stable appearing LEFT temporal bone lesion, posterior to otic capsule, without interval change in size or signal characteristics, consistent with a benign process such as fibrous dysplasia.  No new intracranial metastases are observed. Overall cerebral volume stable. No other significant interval changes.  IMPRESSION: LEFT parietal subcortical metastasis is increased in size, and is accompanied by worsening vasogenic edema, in comparison with the prior examination in June. No new lesions are identified.   Electronically Signed   By: Staci Righter M.D.   On: 09/25/2014 12:17   Impression: Brain metastasis, recent MRI has shown a size increase in left parietal subcortical metastasis. Tumor location  seems to be favorable for PET scan in this situation. This will be needed in order to determine if this finding is due to tumor recurrence or treatment necrosis.     At this time, the patient is clinically stable despite recent findings. No changes in medication.  Plan:  I will schedule a PET scan and follow up with the patient once I review  the results.   ------------------------------------------------  [name], MD, PhD    This document serves as a record of services personally performed by Kyung Rudd, MD. It was created on his behalf by Derek Mound, a trained medical scribe. The creation of this record is based on the scribe's personal observations and the provider's statements to them. This document has been checked and approved by the attending provider.

## 2014-09-28 NOTE — Progress Notes (Signed)
Follow up [place] brain, 05/08/14, MRI 09/25/14 results in, here to discuss, no c/o pain, nausea, vision changes, or light headed, no dizzy ness, appetite good, energy level up and down, today lqst day for Physical Therapy for balance, walks with a cane,  BP 109/74 mmHg  Pulse 116  Temp(Src) 98.4 F (36.9 C) (Oral)  Resp 20  Ht 5\' 8"  (1.727 m)  Wt 129 lb 6.4 oz (58.695 kg)  BMI 19.68 kg/m2  SpO2 100%  Wt Readings from Last 3 Encounters:  09/28/14 129 lb 6.4 oz (58.695 kg)  09/25/14 133 lb (60.328 kg)  07/29/14 129 lb 8 oz (58.741 kg)   9:23 AM  9:22 AM

## 2014-09-29 ENCOUNTER — Other Ambulatory Visit: Payer: Self-pay | Admitting: *Deleted

## 2014-09-29 DIAGNOSIS — C7951 Secondary malignant neoplasm of bone: Secondary | ICD-10-CM

## 2014-09-30 ENCOUNTER — Telehealth: Payer: Self-pay | Admitting: Nurse Practitioner

## 2014-09-30 ENCOUNTER — Other Ambulatory Visit: Payer: Self-pay | Admitting: Radiation Therapy

## 2014-09-30 ENCOUNTER — Other Ambulatory Visit (HOSPITAL_BASED_OUTPATIENT_CLINIC_OR_DEPARTMENT_OTHER): Payer: BC Managed Care – PPO

## 2014-09-30 DIAGNOSIS — C50811 Malignant neoplasm of overlapping sites of right female breast: Secondary | ICD-10-CM

## 2014-09-30 DIAGNOSIS — C7951 Secondary malignant neoplasm of bone: Secondary | ICD-10-CM

## 2014-09-30 DIAGNOSIS — C7931 Secondary malignant neoplasm of brain: Secondary | ICD-10-CM

## 2014-09-30 LAB — CBC WITH DIFFERENTIAL/PLATELET
BASO%: 1.2 % (ref 0.0–2.0)
Basophils Absolute: 0 10*3/uL (ref 0.0–0.1)
EOS ABS: 0 10*3/uL (ref 0.0–0.5)
EOS%: 0.7 % (ref 0.0–7.0)
HEMATOCRIT: 32.3 % — AB (ref 34.8–46.6)
HGB: 11.1 g/dL — ABNORMAL LOW (ref 11.6–15.9)
LYMPH#: 0.6 10*3/uL — AB (ref 0.9–3.3)
LYMPH%: 27.8 % (ref 14.0–49.7)
MCH: 32.1 pg (ref 25.1–34.0)
MCHC: 34.2 g/dL (ref 31.5–36.0)
MCV: 94 fL (ref 79.5–101.0)
MONO#: 0.3 10*3/uL (ref 0.1–0.9)
MONO%: 14 % (ref 0.0–14.0)
NEUT%: 56.3 % (ref 38.4–76.8)
NEUTROS ABS: 1.2 10*3/uL — AB (ref 1.5–6.5)
PLATELETS: 138 10*3/uL — AB (ref 145–400)
RBC: 3.44 10*6/uL — AB (ref 3.70–5.45)
RDW: 16.3 % — ABNORMAL HIGH (ref 11.2–14.5)
WBC: 2 10*3/uL — ABNORMAL LOW (ref 3.9–10.3)

## 2014-09-30 LAB — COMPREHENSIVE METABOLIC PANEL (CC13)
ALT: 10 U/L (ref 0–55)
ANION GAP: 8 meq/L (ref 3–11)
AST: 18 U/L (ref 5–34)
Albumin: 4.1 g/dL (ref 3.5–5.0)
Alkaline Phosphatase: 85 U/L (ref 40–150)
BUN: 8.7 mg/dL (ref 7.0–26.0)
CALCIUM: 9.8 mg/dL (ref 8.4–10.4)
CO2: 29 mEq/L (ref 22–29)
CREATININE: 0.8 mg/dL (ref 0.6–1.1)
Chloride: 107 mEq/L (ref 98–109)
Glucose: 82 mg/dl (ref 70–140)
Potassium: 4.1 mEq/L (ref 3.5–5.1)
Sodium: 144 mEq/L (ref 136–145)
TOTAL PROTEIN: 7.3 g/dL (ref 6.4–8.3)

## 2014-09-30 NOTE — Telephone Encounter (Signed)
Patient stopped by to move her labs to mondays instead of Wednesday due to work schedule

## 2014-10-07 ENCOUNTER — Other Ambulatory Visit: Payer: Self-pay | Admitting: *Deleted

## 2014-10-07 ENCOUNTER — Other Ambulatory Visit: Payer: BC Managed Care – PPO

## 2014-10-07 DIAGNOSIS — C50411 Malignant neoplasm of upper-outer quadrant of right female breast: Secondary | ICD-10-CM

## 2014-10-08 ENCOUNTER — Other Ambulatory Visit (HOSPITAL_BASED_OUTPATIENT_CLINIC_OR_DEPARTMENT_OTHER): Payer: BC Managed Care – PPO

## 2014-10-08 ENCOUNTER — Encounter (HOSPITAL_COMMUNITY)
Admission: RE | Admit: 2014-10-08 | Discharge: 2014-10-08 | Disposition: A | Discharge status: Home / Self Care | Payer: BC Managed Care – PPO | Source: Ambulatory Visit | Attending: Radiation Oncology | Admitting: Radiation Oncology

## 2014-10-08 DIAGNOSIS — C50811 Malignant neoplasm of overlapping sites of right female breast: Secondary | ICD-10-CM

## 2014-10-08 DIAGNOSIS — C7931 Secondary malignant neoplasm of brain: Secondary | ICD-10-CM

## 2014-10-08 DIAGNOSIS — C50411 Malignant neoplasm of upper-outer quadrant of right female breast: Secondary | ICD-10-CM

## 2014-10-08 LAB — COMPREHENSIVE METABOLIC PANEL (CC13)
ALBUMIN: 4.4 g/dL (ref 3.5–5.0)
ALK PHOS: 95 U/L (ref 40–150)
ALT: 11 U/L (ref 0–55)
AST: 19 U/L (ref 5–34)
Anion Gap: 7 mEq/L (ref 3–11)
BUN: 9.6 mg/dL (ref 7.0–26.0)
CO2: 30 mEq/L — ABNORMAL HIGH (ref 22–29)
Calcium: 10.1 mg/dL (ref 8.4–10.4)
Chloride: 106 mEq/L (ref 98–109)
Creatinine: 1 mg/dL (ref 0.6–1.1)
EGFR: 81 mL/min/{1.73_m2} — AB (ref >90)
GLUCOSE: 101 mg/dL (ref 70–140)
POTASSIUM: 3.9 meq/L (ref 3.5–5.1)
SODIUM: 143 meq/L (ref 136–145)
Total Bilirubin: <0.3 mg/dL (ref 0.20–1.20)
Total Protein: 7.7 g/dL (ref 6.4–8.3)

## 2014-10-08 LAB — CBC WITH DIFFERENTIAL/PLATELET
BASO%: 0.7 % (ref 0.0–2.0)
BASOS ABS: 0 10*3/uL (ref 0.0–0.1)
EOS ABS: 0 10*3/uL (ref 0.0–0.5)
EOS%: 0.4 % (ref 0.0–7.0)
HCT: 34.9 % (ref 34.8–46.6)
HEMOGLOBIN: 11.9 g/dL (ref 11.6–15.9)
LYMPH%: 27.2 % (ref 14.0–49.7)
MCH: 31.8 pg (ref 25.1–34.0)
MCHC: 34.1 g/dL (ref 31.5–36.0)
MCV: 93.4 fL (ref 79.5–101.0)
MONO#: 0.1 10*3/uL (ref 0.1–0.9)
MONO%: 5.5 % (ref 0.0–14.0)
NEUT#: 1.5 10*3/uL (ref 1.5–6.5)
NEUT%: 66.2 % (ref 38.4–76.8)
Platelets: 238 10*3/uL (ref 145–400)
RBC: 3.74 10*6/uL (ref 3.70–5.45)
RDW: 16.3 % — ABNORMAL HIGH (ref 11.2–14.5)
WBC: 2.3 10*3/uL — ABNORMAL LOW (ref 3.9–10.3)
lymph#: 0.6 10*3/uL — ABNORMAL LOW (ref 0.9–3.3)

## 2014-10-09 ENCOUNTER — Other Ambulatory Visit: Payer: Self-pay | Admitting: *Deleted

## 2014-10-09 DIAGNOSIS — C50411 Malignant neoplasm of upper-outer quadrant of right female breast: Secondary | ICD-10-CM

## 2014-10-12 ENCOUNTER — Encounter: Payer: Self-pay | Admitting: Radiation Oncology

## 2014-10-12 ENCOUNTER — Other Ambulatory Visit (HOSPITAL_BASED_OUTPATIENT_CLINIC_OR_DEPARTMENT_OTHER): Payer: BC Managed Care – PPO

## 2014-10-12 ENCOUNTER — Ambulatory Visit
Admission: RE | Admit: 2014-10-12 | Discharge: 2014-10-12 | Disposition: A | Discharge status: Home / Self Care | Payer: BC Managed Care – PPO | Source: Ambulatory Visit | Attending: Radiation Oncology | Admitting: Radiation Oncology

## 2014-10-12 VITALS — BP 105/79 | HR 114 | Temp 98.3°F | Resp 20 | Ht 68.0 in | Wt 126.2 lb

## 2014-10-12 DIAGNOSIS — C50811 Malignant neoplasm of overlapping sites of right female breast: Secondary | ICD-10-CM

## 2014-10-12 DIAGNOSIS — C50411 Malignant neoplasm of upper-outer quadrant of right female breast: Secondary | ICD-10-CM

## 2014-10-12 DIAGNOSIS — C7931 Secondary malignant neoplasm of brain: Principal | ICD-10-CM

## 2014-10-12 DIAGNOSIS — C50919 Malignant neoplasm of unspecified site of unspecified female breast: Secondary | ICD-10-CM

## 2014-10-12 LAB — COMPREHENSIVE METABOLIC PANEL (CC13)
ALT: 9 U/L (ref 0–55)
ANION GAP: 8 meq/L (ref 3–11)
AST: 17 U/L (ref 5–34)
Albumin: 4.1 g/dL (ref 3.5–5.0)
Alkaline Phosphatase: 88 U/L (ref 40–150)
BUN: 11.4 mg/dL (ref 7.0–26.0)
CALCIUM: 9.6 mg/dL (ref 8.4–10.4)
CHLORIDE: 107 meq/L (ref 98–109)
CO2: 28 meq/L (ref 22–29)
Creatinine: 1.1 mg/dL (ref 0.6–1.1)
EGFR: 74 mL/min/{1.73_m2} — AB (ref >90)
Glucose: 102 mg/dl (ref 70–140)
Potassium: 3.7 mEq/L (ref 3.5–5.1)
Sodium: 143 mEq/L (ref 136–145)
Total Bilirubin: <0.3 mg/dL (ref 0.20–1.20)
Total Protein: 7.4 g/dL (ref 6.4–8.3)

## 2014-10-12 LAB — CBC WITH DIFFERENTIAL/PLATELET
BASO%: 0.8 % (ref 0.0–2.0)
BASOS ABS: 0 10*3/uL (ref 0.0–0.1)
EOS ABS: 0 10*3/uL (ref 0.0–0.5)
EOS%: 1.1 % (ref 0.0–7.0)
HEMATOCRIT: 34.8 % (ref 34.8–46.6)
HGB: 12 g/dL (ref 11.6–15.9)
LYMPH%: 33.3 % (ref 14.0–49.7)
MCH: 31.8 pg (ref 25.1–34.0)
MCHC: 34.3 g/dL (ref 31.5–36.0)
MCV: 92.6 fL (ref 79.5–101.0)
MONO#: 0.1 10*3/uL (ref 0.1–0.9)
MONO%: 5 % (ref 0.0–14.0)
NEUT#: 1.2 10*3/uL — ABNORMAL LOW (ref 1.5–6.5)
NEUT%: 59.8 % (ref 38.4–76.8)
PLATELETS: 194 10*3/uL (ref 145–400)
RBC: 3.76 10*6/uL (ref 3.70–5.45)
RDW: 15.8 % — ABNORMAL HIGH (ref 11.2–14.5)
WBC: 2 10*3/uL — ABNORMAL LOW (ref 3.9–10.3)
lymph#: 0.7 10*3/uL — ABNORMAL LOW (ref 0.9–3.3)

## 2014-10-12 NOTE — Progress Notes (Signed)
Follow up s/p Pet Scan Brain on 10/08/14, takes Ibrance 100mg  po daily, no pain, no headaches, no nausea, no dizzyness, or vision changes, appetite good, energy level good 3:49 PM BP 105/79 mmHg  Pulse 114  Temp(Src) 98.3 F (36.8 C) (Oral)  Resp 20  Ht 5\' 8"  (1.727 m)  Wt 126 lb 3.2 oz (57.244 kg)  BMI 19.19 kg/m2  SpO2 100%  Wt Readings from Last 3 Encounters:  10/12/14 126 lb 3.2 oz (57.244 kg)  09/28/14 129 lb 6.4 oz (58.695 kg)  09/25/14 133 lb (60.328 kg)

## 2014-10-12 NOTE — Progress Notes (Addendum)
Radiation Oncology         [phone] ________________________________  Name: Carolyn [name] MRN: [id number]  Date: 10/12/2014  DOB: January 08, 1971  Follow-Up Visit Note  CC: [name], MD  [name], MD  Diagnosis:   Metastatic cancer with brain metastasis  Interval Since Last Radiation:  Completed stereotactic radiosurgery to a left parietal lesion on 04/29/2014 to a dose of 20 gray   Narrative:  The patient returns today for routine follow-up.  The patient clinically has been doing very well. She has been stable. The patient on her last MRI scan had some worrisome findings in regards to the treated area which had enlarged and was demonstrating worsening vasogenic edema. No new lesions were identified. The patient proceeded with a PET scan of the brain for clarification. This did demonstrate associated focal metabolic activity at this location which  Is consistent with tumor recurrence/progression. The patient comes in today to review this scan. The patient did have some edema which was seen on her MRI scan. The patient continues to state that she feels well. No recent headaches, nausea, significant vision changes.                              ALLERGIES:  is allergic to allegra and shellfish allergy.  Meds: Current Outpatient Prescriptions  Medication Sig Dispense Refill  . acetaminophen (TYLENOL) 325 MG tablet Take 2 tablets (650 mg total) by mouth every 6 (six) hours as needed for mild pain, moderate pain, fever or headache.    . Calcium Carbonate-Vit D-Min (CALCIUM 1200 PO) Take 1 tablet by mouth 2 (two) times daily.    . Cholecalciferol (VITAMIN D3) 2000 UNITS TABS Take 2,000 Units by mouth daily.    . Multiple Vitamin (MULTIVITAMIN) tablet Take 1 tablet by mouth daily.    . palbociclib (IBRANCE) 100 MG capsule Take 1 capsule (100 mg total) by mouth daily with breakfast. Take whole with food. Take daily for 21 days and then off for 7 days. 21 capsule 3   No current  facility-administered medications for this encounter.    Physical Findings: The patient is in no acute distress. Patient is alert and oriented.  height is 5\' 8"  (1.727 m) and weight is 126 lb 3.2 oz (57.244 kg). Her oral temperature is 98.3 F (36.8 C). Her blood pressure is 105/79 and her pulse is 114. Her respiration is 20 and oxygen saturation is 100%. .     Lab Findings: Lab Results  Component Value Date   WBC 2.0* 10/12/2014   HGB 12.0 10/12/2014   HCT 34.8 10/12/2014   MCV 92.6 10/12/2014   PLT 194 10/12/2014     Radiographic Findings: Mr Jeri Cos YQ Contrast  09/25/2014   CLINICAL DATA:  SRS 3 month restaging.  Breast cancer.  EXAM: MRI HEAD WITHOUT AND WITH CONTRAST  TECHNIQUE: Multiplanar, multiecho pulse sequences of the brain and surrounding structures were obtained without and with intravenous contrast.  CONTRAST:  61mL MULTIHANCE GADOBENATE DIMEGLUMINE 529 MG/ML IV SOLN  COMPARISON:  06/26/2014 most recent.  FINDINGS: LEFT parietal subcortical metastasis is redemonstrated on today's exam, and displays increasing enhancement as well as worsening vasogenic edema. Measurements are 14 x 19 x 19 mm on postcontrast imaging. Vasogenic edema extends anteriorly to the LEFT lentiform nucleus, and posteriorly to involve more of the LEFT parietal white matter. No significant midline shift. Within the lesion, Central blood products are redemonstrated, along with mild  peripheral diffusion restriction, similar to priors.  Stable appearing LEFT temporal bone lesion, posterior to otic capsule, without interval change in size or signal characteristics, consistent with a benign process such as fibrous dysplasia.  No new intracranial metastases are observed. Overall cerebral volume stable. No other significant interval changes.  IMPRESSION: LEFT parietal subcortical metastasis is increased in size, and is accompanied by worsening vasogenic edema, in comparison with the prior examination in June. No new  lesions are identified.   Electronically Signed   By: Staci Righter M.D.   On: 09/25/2014 76:28   Nm Pet Metabolic Brain  03/24/1759   CLINICAL DATA:  Patient status post stereotactic radiosurgery a for a LEFT parietal lobe brain metastasis. Lesion enlarging enhancing on recent MRI brain to.  EXAM: NM PET METABOLIC BRAIN, 3D fusion reconstruction performed.  TECHNIQUE: 9.3 mCi F-18 FDG was injected intravenously via the antecubital fossa. Full-ring PET imaging was performed from the vertex to the skull base. CT data was obtained and used for attenuation correction and anatomic localization. PET data set was fused three-dimensional a with MRI data set  COMPARISON:  Brain MRI 08/18/2014 2016, 06/26/2014  FINDINGS: On the fused PET MRI data set, there is a focus of intense metabolic activity within the deep white matter of the LEFT parietal lobe which corresponds to the enhancing lesion on comparison contrast brain MRI. No additional abnormal uptake noted within the cortex or white matter.  IMPRESSION: Focal metabolic activity within the deep white matter of the LEFT parietal lobe corresponds to the enhancing lesion on comparison MRI and is concerning for tumor recurrence.   Electronically Signed   By: Suzy Bouchard M.D.   On: 10/08/2014 16:09    Impression:    The patient has evidence for likely progression of a left parietal lesion which received radiosurgery previously. The patient's case has been discussed at brain conference and it was recommended for the patient to be evaluated for possible AutoLITT at [facility].  I discussed this with the patient and she is interested in pursuing this option.  The patient does have some significant edema seen on her imaging studies. Clinically the patient is doing well without any clear symptoms related to this. However, given the extentof the changes seen, I have called in a prescription for Decadron 4 mg twice a day.  Plan:  I will make a referral to [facility]  for evaluation for the AutoLITT procedure. We will also plan to have the patient return to our clinic in 3 months for a repeat MRI scan of the brain.   [name], M.D., Ph.D.

## 2014-10-13 MED ORDER — DEXAMETHASONE 4 MG PO TABS: 4.0000 mg | ORAL_TABLET | Freq: Two times a day (BID) | ORAL | Status: DC

## 2014-10-13 NOTE — Addendum Note (Signed)
Encounter addended by: Kyung Rudd, MD on: 10/13/2014 11:26 AM<BR>     Documentation filed: Dx Association, Clinical Notes, Notes Section, Flowsheet VN, Orders

## 2014-10-13 NOTE — Addendum Note (Signed)
Encounter addended by: Kyung Rudd, MD on: 10/13/2014 11:11 AM<BR>     Documentation filed: Orders

## 2014-10-14 ENCOUNTER — Telehealth: Payer: Self-pay | Admitting: *Deleted

## 2014-10-14 ENCOUNTER — Other Ambulatory Visit: Payer: BC Managed Care – PPO

## 2014-10-14 NOTE — Telephone Encounter (Signed)
Returned call to patient after hearing voice message that was left on my phone, her RX Decadron was sent e-script to [facility] on 10/13/14, they are open at 22am,she can call there to se if ready if not call me back 7:24 AM

## 2014-10-15 ENCOUNTER — Encounter: Payer: Self-pay | Admitting: Genetic Counselor

## 2014-10-15 DIAGNOSIS — Z1379 Encounter for other screening for genetic and chromosomal anomalies: Secondary | ICD-10-CM | POA: Insufficient documentation

## 2014-10-16 ENCOUNTER — Telehealth: Payer: Self-pay | Admitting: *Deleted

## 2014-10-16 ENCOUNTER — Other Ambulatory Visit: Payer: Self-pay | Admitting: *Deleted

## 2014-10-16 DIAGNOSIS — C7951 Secondary malignant neoplasm of bone: Secondary | ICD-10-CM

## 2014-10-16 NOTE — Telephone Encounter (Signed)
CALLED PATIENT TO INFORM OF APPT. WITH DR . TATTER ON 10-29-14- ARRIVAL TIME - 2:45 PM, LVM FOR A RETURN CALL

## 2014-10-19 ENCOUNTER — Other Ambulatory Visit (HOSPITAL_BASED_OUTPATIENT_CLINIC_OR_DEPARTMENT_OTHER): Payer: BC Managed Care – PPO

## 2014-10-19 DIAGNOSIS — C50811 Malignant neoplasm of overlapping sites of right female breast: Secondary | ICD-10-CM | POA: Diagnosis not present

## 2014-10-19 DIAGNOSIS — C7951 Secondary malignant neoplasm of bone: Secondary | ICD-10-CM

## 2014-10-19 LAB — CBC WITH DIFFERENTIAL/PLATELET
BASO%: 0 % (ref 0.0–2.0)
Basophils Absolute: 0 10e3/uL (ref 0.0–0.1)
EOS%: 0 % (ref 0.0–7.0)
Eosinophils Absolute: 0 10e3/uL (ref 0.0–0.5)
HCT: 37.1 % (ref 34.8–46.6)
HGB: 12.7 g/dL (ref 11.6–15.9)
LYMPH%: 30 % (ref 14.0–49.7)
MCH: 32.1 pg (ref 25.1–34.0)
MCHC: 34.1 g/dL (ref 31.5–36.0)
MCV: 94.1 fL (ref 79.5–101.0)
MONO#: 0.2 10e3/uL (ref 0.1–0.9)
MONO%: 7.8 % (ref 0.0–14.0)
NEUT#: 1.8 10e3/uL (ref 1.5–6.5)
NEUT%: 62.2 % (ref 38.4–76.8)
Platelets: 154 10e3/uL (ref 145–400)
RBC: 3.94 10e6/uL (ref 3.70–5.45)
RDW: 16.8 % — ABNORMAL HIGH (ref 11.2–14.5)
WBC: 2.8 10e3/uL — ABNORMAL LOW (ref 3.9–10.3)
lymph#: 0.8 10e3/uL — ABNORMAL LOW (ref 0.9–3.3)

## 2014-10-19 LAB — COMPREHENSIVE METABOLIC PANEL (CC13)
ALT: 11 U/L (ref 0–55)
AST: 12 U/L (ref 5–34)
Albumin: 4.1 g/dL (ref 3.5–5.0)
Alkaline Phosphatase: 85 U/L (ref 40–150)
Anion Gap: 7 meq/L (ref 3–11)
BUN: 17.1 mg/dL (ref 7.0–26.0)
CO2: 30 meq/L — ABNORMAL HIGH (ref 22–29)
Calcium: 9.7 mg/dL (ref 8.4–10.4)
Chloride: 105 meq/L (ref 98–109)
Creatinine: 0.9 mg/dL (ref 0.6–1.1)
EGFR: >90 ml/min/1.73 m2 (ref >90)
Glucose: 76 mg/dL (ref 70–140)
Potassium: 3.6 meq/L (ref 3.5–5.1)
Sodium: 142 meq/L (ref 136–145)
Total Bilirubin: <0.3 mg/dL (ref 0.20–1.20)
Total Protein: 7.3 g/dL (ref 6.4–8.3)

## 2014-10-21 ENCOUNTER — Other Ambulatory Visit: Payer: BC Managed Care – PPO

## 2014-10-23 ENCOUNTER — Telehealth: Payer: Self-pay | Admitting: Radiation Therapy

## 2014-10-23 NOTE — Telephone Encounter (Signed)
In response to [facility] question about decreasing her steroid dose, Dr.Moody asked that I tell her the plan was for her to continue taking mg twice a day until after her AutoLitt procedure and then it would be tapered down. Her consult at [facility] is scheduled for 10/20 to discuss the AutoLitt procedure.       However, he said if the current dosage is becoming intolerable or causing quality of life issues, that she could cut back to taking only one pill a day ideally in the morning so it dose not affect her sleep.       Carolyn [name] said that she will continue to try taking the 4 mg twice a day until after her procedure as recommended, but will cut back to once per day if it becomes intolerable.  I will route this message to Dr. Lisbeth Renshaw and his nurse Thayer Headings so they are both aware of our conversation.  Mont Dutton R.T.(R).(T). Special Procedures Navigator

## 2014-10-26 ENCOUNTER — Other Ambulatory Visit: Payer: Self-pay | Admitting: *Deleted

## 2014-10-26 ENCOUNTER — Other Ambulatory Visit (HOSPITAL_BASED_OUTPATIENT_CLINIC_OR_DEPARTMENT_OTHER): Payer: BC Managed Care – PPO

## 2014-10-26 DIAGNOSIS — C50811 Malignant neoplasm of overlapping sites of right female breast: Secondary | ICD-10-CM | POA: Diagnosis not present

## 2014-10-26 DIAGNOSIS — C7951 Secondary malignant neoplasm of bone: Secondary | ICD-10-CM

## 2014-10-26 LAB — COMPREHENSIVE METABOLIC PANEL (CC13)
ALBUMIN: 3.9 g/dL (ref 3.5–5.0)
ALK PHOS: 81 U/L (ref 40–150)
ALT: 14 U/L (ref 0–55)
AST: 14 U/L (ref 5–34)
Anion Gap: 8 mEq/L (ref 3–11)
BUN: 16.8 mg/dL (ref 7.0–26.0)
CALCIUM: 9.5 mg/dL (ref 8.4–10.4)
CO2: 31 mEq/L — ABNORMAL HIGH (ref 22–29)
Chloride: 104 mEq/L (ref 98–109)
Creatinine: 0.8 mg/dL (ref 0.6–1.1)
GLUCOSE: 101 mg/dL (ref 70–140)
POTASSIUM: 3.6 meq/L (ref 3.5–5.1)
Sodium: 143 mEq/L (ref 136–145)
TOTAL PROTEIN: 7 g/dL (ref 6.4–8.3)

## 2014-10-26 LAB — CBC WITH DIFFERENTIAL/PLATELET
BASO%: 0 % (ref 0.0–2.0)
Basophils Absolute: 0 10*3/uL (ref 0.0–0.1)
EOS%: 0 % (ref 0.0–7.0)
Eosinophils Absolute: 0 10*3/uL (ref 0.0–0.5)
HCT: 36.8 % (ref 34.8–46.6)
HGB: 12.8 g/dL (ref 11.6–15.9)
LYMPH%: 23.9 % (ref 14.0–49.7)
MCH: 32.6 pg (ref 25.1–34.0)
MCHC: 34.8 g/dL (ref 31.5–36.0)
MCV: 93.6 fL (ref 79.5–101.0)
MONO#: 0.4 10*3/uL (ref 0.1–0.9)
MONO%: 8.4 % (ref 0.0–14.0)
NEUT#: 3.5 10*3/uL (ref 1.5–6.5)
NEUT%: 67.7 % (ref 38.4–76.8)
Platelets: 134 10*3/uL — ABNORMAL LOW (ref 145–400)
RBC: 3.93 10*6/uL (ref 3.70–5.45)
RDW: 14.9 % — ABNORMAL HIGH (ref 11.2–14.5)
WBC: 5.1 10*3/uL (ref 3.9–10.3)
lymph#: 1.2 10*3/uL (ref 0.9–3.3)

## 2014-10-28 ENCOUNTER — Other Ambulatory Visit: Payer: BC Managed Care – PPO

## 2014-11-02 ENCOUNTER — Other Ambulatory Visit: Payer: BC Managed Care – PPO

## 2014-11-04 ENCOUNTER — Other Ambulatory Visit: Payer: Self-pay | Admitting: *Deleted

## 2014-11-04 ENCOUNTER — Other Ambulatory Visit (HOSPITAL_BASED_OUTPATIENT_CLINIC_OR_DEPARTMENT_OTHER): Payer: BC Managed Care – PPO

## 2014-11-04 ENCOUNTER — Encounter: Payer: Self-pay | Admitting: Nurse Practitioner

## 2014-11-04 ENCOUNTER — Ambulatory Visit (HOSPITAL_BASED_OUTPATIENT_CLINIC_OR_DEPARTMENT_OTHER): Payer: BC Managed Care – PPO | Admitting: Nurse Practitioner

## 2014-11-04 ENCOUNTER — Telehealth: Payer: Self-pay | Admitting: Oncology

## 2014-11-04 VITALS — BP 136/79 | HR 74 | Temp 97.7°F | Resp 16 | Wt 124.2 lb

## 2014-11-04 DIAGNOSIS — R918 Other nonspecific abnormal finding of lung field: Secondary | ICD-10-CM

## 2014-11-04 DIAGNOSIS — C50811 Malignant neoplasm of overlapping sites of right female breast: Secondary | ICD-10-CM

## 2014-11-04 DIAGNOSIS — C7951 Secondary malignant neoplasm of bone: Secondary | ICD-10-CM | POA: Diagnosis not present

## 2014-11-04 DIAGNOSIS — Z23 Encounter for immunization: Secondary | ICD-10-CM | POA: Diagnosis not present

## 2014-11-04 DIAGNOSIS — C7931 Secondary malignant neoplasm of brain: Secondary | ICD-10-CM

## 2014-11-04 DIAGNOSIS — C50411 Malignant neoplasm of upper-outer quadrant of right female breast: Secondary | ICD-10-CM

## 2014-11-04 DIAGNOSIS — Z170 Estrogen receptor positive status [ER+]: Secondary | ICD-10-CM

## 2014-11-04 DIAGNOSIS — K769 Liver disease, unspecified: Secondary | ICD-10-CM

## 2014-11-04 LAB — CBC WITH DIFFERENTIAL/PLATELET
BASO%: 0.1 % (ref 0.0–2.0)
Basophils Absolute: 0 10*3/uL (ref 0.0–0.1)
EOS%: 0 % (ref 0.0–7.0)
Eosinophils Absolute: 0 10*3/uL (ref 0.0–0.5)
HCT: 37.1 % (ref 34.8–46.6)
HGB: 12.7 g/dL (ref 11.6–15.9)
LYMPH%: 6.3 % — ABNORMAL LOW (ref 14.0–49.7)
MCH: 32.2 pg (ref 25.1–34.0)
MCHC: 34.2 g/dL (ref 31.5–36.0)
MCV: 94.2 fL (ref 79.5–101.0)
MONO#: 0.1 10*3/uL (ref 0.1–0.9)
MONO%: 1.3 % (ref 0.0–14.0)
NEUT#: 5.5 10*3/uL (ref 1.5–6.5)
NEUT%: 92.3 % — ABNORMAL HIGH (ref 38.4–76.8)
Platelets: 189 10*3/uL (ref 145–400)
RBC: 3.94 10*6/uL (ref 3.70–5.45)
RDW: 16.6 % — ABNORMAL HIGH (ref 11.2–14.5)
WBC: 5.9 10*3/uL (ref 3.9–10.3)
lymph#: 0.4 10*3/uL — ABNORMAL LOW (ref 0.9–3.3)

## 2014-11-04 LAB — COMPREHENSIVE METABOLIC PANEL (CC13)
ALT: 21 U/L (ref 0–55)
AST: 17 U/L (ref 5–34)
Albumin: 3.9 g/dL (ref 3.5–5.0)
Alkaline Phosphatase: 83 U/L (ref 40–150)
Anion Gap: 7 mEq/L (ref 3–11)
BUN: 21.7 mg/dL (ref 7.0–26.0)
CO2: 30 mEq/L — ABNORMAL HIGH (ref 22–29)
Calcium: 9.8 mg/dL (ref 8.4–10.4)
Chloride: 102 mEq/L (ref 98–109)
Creatinine: 0.8 mg/dL (ref 0.6–1.1)
EGFR: >90 mL/min/{1.73_m2} (ref >90)
Glucose: 105 mg/dl (ref 70–140)
Potassium: 4.2 mEq/L (ref 3.5–5.1)
Sodium: 140 mEq/L (ref 136–145)
Total Bilirubin: <0.3 mg/dL (ref 0.20–1.20)
Total Protein: 7 g/dL (ref 6.4–8.3)

## 2014-11-04 MED ORDER — INFLUENZA VAC SPLIT QUAD 0.5 ML IM SUSY
0.5000 mL | PREFILLED_SYRINGE | Freq: Once | INTRAMUSCULAR | Status: AC
  Administered 2014-11-04: 0.5 mL via INTRAMUSCULAR
  Filled 2014-11-04: qty 0.5

## 2014-11-04 NOTE — Progress Notes (Signed)
[place]  [phone] [phone]) [phone]     ID: Carolyn [name] DOB: 1970-04-17  MR#: [id number]  WGY#:[id number]  Patient Care Team: [name], MD as PCP - General (Family Medicine) Thea Silversmith, MD (Radiation Oncology) Neldon Mc, MD as Surgeon (General Surgery) Chauncey Cruel, MD as Consulting Physician (Oncology) OTHER M.D.JN Susanne Greenhouse, Marye Round  CHIEF COMPLAINT: Estrogen receptor positive breast cancer  CURRENT TREATMENT: fulvestrant, palbociclib, zolendronate  BREAST CANCER HISTORY: From doctor Khan's of original intake node 05/31/2011:  "Carolyn [name] is a 44 y.o. female. Without significant past medical history. She underwent a mammogram that showed calcifications measuring 5 cm on the right breast. She then went on To have an ultrasound which showed the area to measure 2.9 cm. She was also found to have a right axillary lymph node that was suspicious for a malignancy. She had a biopsy of the calcifications that showed high-grade ductal carcinoma in situ. 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 In the lymph node biopsy there was no lymphatic tissue seen and was felt that the node was replaced by tumor. Patient went on to have an MRI of the bilateral breasts performed on evening of 05/30/2011. The MRI showed in the right breast 5 x 3 x 4.5 cm mass abutting the chest wall. About 7 cm away from this there was another mass in the upper inner quadrant that measured 1.5 x 1.7 x 1.5 cm. On the contralateral breast, that is the left breast a 2 cm area suspicious enhancement was noted also this up to date has not been biopsied and arrangements are being made for the biopsy to be performed. In this side there were no suspicious lymph nodes. The prognostic panel is pending. Patient is otherwise without any complaints. "  METASTATIC DISEASE: From the earlier summary note:  Kaneshia noted some  strange feelings around her umbilicus 77/93/9030. This felt like an area of numbness. Over the next 2 days she noted some leg weakness and difficulty walking, so she presented to the ED 04/12/2014. MRI of the thoraco-lumbar spine was obtained 04/12/2014 showing multiple bone lesions and compression fracture at T8 with retropulsion and cord compression. On 04/13/2014 she underwent Laminectomy T8 decompression of spinal cord posterior fixation from T6-T10 with pedicle screws and rods posterior arthrodesis with allograft. The pathology from this procedure (SZA [phone]) showed metastatic adenocarcinoma which was estrogen receptor 69% positive, with moderate staining intensity, progesterone receptor negative. HER-2 could not be obtained.  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 There were nonspecific L temporal bone changes and also possible involvement of the clivus and calvarium, but no other parenchymal brain lesions. Further staging studies included a bone scan, which failed to show the lytic lesion seen on other scans, and CTs of the chest, abdomen and pelvis on 06/08/2011, which showed very small right lung and left liver lesions which will require follow-up  Her subsequent history is as detailed below  INTERVAL HISTORY: Carolyn [name] returns today for follow up of her stage IV breast cancer. She continues on palbociclib with good tolerance. She estimates this cycle will end on 11/7, which makes today day 7 or 8. She has not had her fulvestrant injection since August due to a scheduling miscommunication, and she would have been due for zometa this week but it is not scheduled either.  The interval history is remarkable for progression in the brain. She was recommended to consult [facility], and they have scheduled a procedure by the name of "auto-LITT" where the patient says they will enter the brain  and use a laser on her largest lesion.  REVIEW OF SYSTEMS: Carolyn [name] is back to work and is able to keep up. Her energy level is good. She still ambulates with a cane but her balance is improving. She denies pain. She denies fevers, chills, nausea, vomiting, or changes in bowel or bladder habits. She has no headaches, dizziness, vision changes, or weakness. A detailed review of system is otherwise stable.  PAST MEDICAL HISTORY: Past Medical History  Diagnosis Date  . Breast cancer ([place]) 09/2011    invasive ductal carcinoma metastatic ca in 3/14 lymph nodes  . Hx of radiation therapy 11/08/11 -12/26/11    right chest wall/supraclav fossa, right scar  . History of chemotherapy 06/27/11 -09/04/11    neoadjuvant  . S/P radiation therapy 05/08/14    SRS brain    PAST SURGICAL HISTORY: Past Surgical History  Procedure Laterality Date  . Wisdom tooth extraction    . Portacath placement  06/20/2011    Procedure: INSERTION PORT-A-CATH;  Surgeon: Haywood Lasso, MD;  Location: [place];  Service: General;  Laterality: N/A;  . Modified mastectomy  10/03/2011    Procedure: MODIFIED MASTECTOMY;  Surgeon: Haywood Lasso, MD;  Location: [place];  Service: General;  Laterality: Right;  . Port-a-cath removal  01/30/2012    Procedure: MINOR REMOVAL PORT-A-CATH;  Surgeon: Haywood Lasso, MD;  Location: [place];  Service: General;  Laterality: Left;  . Breast biopsy      Left  . Robotic assisted total hysterectomy with bilateral salpingo oopherectomy Bilateral 12/23/2012    Procedure: ROBOTIC ASSISTED TOTAL HYSTERECTOMY WITH BILATERAL SALPINGO OOPHORECTOMY;  Surgeon: Marvene Staff, MD;  Location: [facility];  Service: Gynecology;  Laterality: Bilateral;  . Cholecystectomy N/A 03/01/2014    Procedure: LAPAROSCOPIC CHOLECYSTECTOMY;  Surgeon: Leighton Ruff, MD;  Location: [facility];  Service: General;  Laterality: N/A;  . Laminectomy N/A 04/13/2014    Procedure: THORACIC  LAMINECTOMY WITH FIXATION THORACIC SIX-THORACIC TEN FUSION;  Surgeon: Kristeen Miss, MD;  Location: [facility];  Service: Neurosurgery;  Laterality: N/A;    FAMILY HISTORY Family History  Problem Relation Age of Onset  . Breast cancer Maternal Aunt 63  . Pancreatic cancer Maternal Grandfather   . Pancreatic cancer Maternal Aunt     died in her 22s  . Leukemia Maternal Aunt     died in her 5s  . Ovarian cancer Cousin 65    maternal cousin   the patient's father is alive, currently 39 years old. The patient's mother died from complications of diabetes at the age of 51. The patient had no brothers, 4 sisters. One sister has died from congestive heart failure. The patient's mother is one of 5 sisters. One of them was diagnosed with breast cancer the age of 99. Also a cousin on the maternal side it was diagnosed with ovarian cancer. This was approximately age 19. There is also colon cancer in the family. The patient has had extensive genetic testing summarized below. She is however BRCA negative  GYNECOLOGIC HISTORY:  No LMP recorded. Patient is not currently having periods (Reason: Chemotherapy). Menarche age 37, she is GX P0. She underwent total hysterectomy with bilateral salpingo-oophorectomy December 2014.  SOCIAL HISTORY:  Donda works at the  [employer] for [employer]. She mostly deals with government documents. She is single, lives by herself, with no pets. She attends [employer] locally.    ADVANCED DIRECTIVES: Not in place. The patient has a documents and intends to name her sister Mirta Mally as healthcare power of attorney. Joelene Millin lives in [place] and can be reached at [place]: Social History  Substance Use Topics  . Smoking status: Never Smoker   . Smokeless tobacco: Never Used  . Alcohol Use: No     Colonoscopy:  PAP:  Bone density:  Lipid panel:  Allergies  Allergen Reactions  . Allegra [Fexofenadine] Hives    Abdomen only  . Shellfish  Allergy Hives    Abdomen only    Current Outpatient Prescriptions  Medication Sig Dispense Refill  . acetaminophen (TYLENOL) 325 MG tablet Take 2 tablets (650 mg total) by mouth every 6 (six) hours as needed for mild pain, moderate pain, fever or headache.    . Calcium Carbonate-Vit D-Min (CALCIUM 1200 PO) Take 1 tablet by mouth 2 (two) times daily.    . Cholecalciferol (VITAMIN D3) 2000 UNITS TABS Take 2,000 Units by mouth daily.    Marland Kitchen dexamethasone (DECADRON) 4 MG tablet Take 1 tablet (4 mg total) by mouth 2 (two) times daily with a meal. 60 tablet 0  . Multiple Vitamin (MULTIVITAMIN) tablet Take 1 tablet by mouth daily.    . palbociclib (IBRANCE) 100 MG capsule Take 1 capsule (100 mg total) by mouth daily with breakfast. Take whole with food. Take daily for 21 days and then off for 7 days. 21 capsule 3   No current facility-administered medications for this visit.    OBJECTIVE: Middle-aged [place] American woman using a cane Filed Vitals:   11/04/14 1424  BP: 136/79  Pulse: 74  Temp: 97.7 F (36.5 C)  Resp: 16     Body mass index is 18.89 kg/(m^2).    ECOG FS:1 - Symptomatic but completely ambulatory  Skin: warm, dry  HEENT: sclerae anicteric, conjunctivae pink, oropharynx clear. No thrush or mucositis.  Lymph Nodes: No cervical or supraclavicular lymphadenopathy  Lungs: clear to auscultation bilaterally, no rales, wheezes, or rhonci  Heart: regular rate and rhythm  Abdomen: round, soft, non tender, positive bowel sounds  Musculoskeletal: No focal spinal tenderness, no peripheral edema  Neuro: non focal, well oriented, positive affect  Breasts: right breast status post mastectomy. No evidence of recurrent disease. Right axilla benign.   LAB RESULTS:  CMP     Component Value Date/Time   NA 140 11/04/2014 1419   NA 141 04/21/2014 0910   K 4.2 11/04/2014 1419   K 3.6 04/21/2014 0910   CL 103 04/21/2014 0910   CL 102 01/16/2012 0804   CO2 30* 11/04/2014 1419   CO2 27  04/21/2014 0910   GLUCOSE 105 11/04/2014 1419   GLUCOSE 133* 04/21/2014 0910   GLUCOSE 92 01/16/2012 0804   BUN 21.7 11/04/2014 1419   BUN 9 04/21/2014 0910   CREATININE 0.8 11/04/2014 1419   CREATININE 0.77 04/21/2014 0910   CALCIUM 9.8 11/04/2014 1419   CALCIUM 9.5 04/21/2014 0910   PROT 7.0 11/04/2014 1419   PROT 6.3 04/21/2014 0910   ALBUMIN 3.9 11/04/2014 1419   ALBUMIN 3.1* 04/21/2014 0910   AST 17 11/04/2014 1419   AST 26 04/21/2014 0910   ALT 21 11/04/2014 1419   ALT 19 04/21/2014 0910   ALKPHOS 83 11/04/2014 1419   ALKPHOS 99 04/21/2014 0910  BILITOT <0.30 11/04/2014 1419   BILITOT 0.5 04/21/2014 0910   GFRNONAA >90 04/21/2014 0910   GFRAA >90 04/21/2014 0910    I No results found for: SPEP  Lab Results  Component Value Date   WBC 5.9 11/04/2014   NEUTROABS 5.5 11/04/2014   HGB 12.7 11/04/2014   HCT 37.1 11/04/2014   MCV 94.2 11/04/2014   PLT 189 11/04/2014      Chemistry      Component Value Date/Time   NA 140 11/04/2014 1419   NA 141 04/21/2014 0910   K 4.2 11/04/2014 1419   K 3.6 04/21/2014 0910   CL 103 04/21/2014 0910   CL 102 01/16/2012 0804   CO2 30* 11/04/2014 1419   CO2 27 04/21/2014 0910   BUN 21.7 11/04/2014 1419   BUN 9 04/21/2014 0910   CREATININE 0.8 11/04/2014 1419   CREATININE 0.77 04/21/2014 0910      Component Value Date/Time   CALCIUM 9.8 11/04/2014 1419   CALCIUM 9.5 04/21/2014 0910   ALKPHOS 83 11/04/2014 1419   ALKPHOS 99 04/21/2014 0910   AST 17 11/04/2014 1419   AST 26 04/21/2014 0910   ALT 21 11/04/2014 1419   ALT 19 04/21/2014 0910   BILITOT <0.30 11/04/2014 1419   BILITOT 0.5 04/21/2014 0910       Lab Results  Component Value Date   LABCA2 24 05/31/2011    No components found for: [id number]  No results for input(s): INR in the last 168 hours.  Urinalysis No results found for: COLORURINE  STUDIES: Nm Pet Metabolic Brain  4/82/5003  CLINICAL DATA:  Patient status post stereotactic radiosurgery a for  a LEFT parietal lobe brain metastasis. Lesion enlarging enhancing on recent MRI brain to. EXAM: NM PET METABOLIC BRAIN, 3D fusion reconstruction performed. TECHNIQUE: 9.3 mCi F-18 FDG was injected intravenously via the antecubital fossa. Full-ring PET imaging was performed from the vertex to the skull base. CT data was obtained and used for attenuation correction and anatomic localization. PET data set was fused three-dimensional a with MRI data set COMPARISON:  Brain MRI 08/18/2014 2016, 06/26/2014 FINDINGS: On the fused PET MRI data set, there is a focus of intense metabolic activity within the deep white matter of the LEFT parietal lobe which corresponds to the enhancing lesion on comparison contrast brain MRI. No additional abnormal uptake noted within the cortex or white matter. IMPRESSION: Focal metabolic activity within the deep white matter of the LEFT parietal lobe corresponds to the enhancing lesion on comparison MRI and is concerning for tumor recurrence. Electronically Signed   By: Suzy Bouchard M.D.   On: 10/08/2014 16:09    ASSESSMENT: 44 y.o. BRCA negative  woman status post right breast upper outer quadrant and right axillary lymph node biopsy 05/18/2011, both positive for an invasive ductal carcinoma, high-grade, clinicallyT2 N1-2 or stage IIB/IIIA,  estrogen receptor 100% positive, progesterone receptor 87% positive, with an MIB-1 of 14% and no HER-2 amplification ([id number]).  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   (a) VUS were also found in [place] and BARD1  (2) additional right breast biopsy upper inner quadrant 06/08/2011 showed only a fibroadenoma, and central left breast biopsy for another suspicious lesion showed only fibrocystic changes ([id number] and [id number]).   (3)Treated neoadjuvantly with cyclophosphamide and docetaxel x4 completed 08/29/2011.  (4) status post right modified radical mastectomy  10/03/2011 showing a residual pT1c pN1a (3/18 lymph nodes positive) invasive ductal  carcinoma, grade 1,estrogen receptor 100% positive, progesterone receptor negative, with no HER-2 amplification ([id number])  (a) anterolateral mastectomy pending, to be followed by reconstruction  (5) adjuvant radiation to the right chest wall, right supraclavicular fossa and right scar completed 12/26/2011  (6) tamoxifen started January 2014-- discontinued April 2016 with evidence of metastatic disease  (7) status post total hysterectomy with bilateral salpingo-oophorectomy 12/23/2012 with benign pathology ([id number])  METASTATIC DISEASE 04/13/2014 (8) presented with T8 cord compression and underwent laminectomy 04/13/2014 with T8 decompression of spinal cord, posterior fixation from T6-T10 with pedicle screws and rods, posterior arthrodesis with allograft. 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   (9) additional staging studies so far show (a) multiple bone lesions, mostly lytic (so not well seen on bone scan) (b) single 0.7 cm brain metastasis at L centrum semiovale 04/29/2014  (c) RUL (24m) and RLL (28m pulmonary nodules  (d) small left liver lesions, possibly mew  (10) RADIATION IN METASTATIC SETTING:  (a) SRS to Lt Post Centrum Semiovale to 20 Gy given20 Gy. ExacTrac Snap verification was performed for each couch angle.   (b) radiation to the T-spine completed 05/08/2014.  (c) [facility]ITT" [facility]  (11) started fulvestrant 05/18/2014 and Palbociclib 06/01/2014  (a) Palbociclib dose decreased to 100 mg daily, 21/7 as of 08/05/2014  (12) started zolendronate 05/18/2014  PLAN: WeMakayelaooks and feels well today. The labs were reviewed in detail and were stable. It seems like she is tolerating the palbociclib at 10037m1 days on, 7 days off with no major dips  in her ANCLajase will stick to this dose and move labs to just monthly at this point. She has lapsed on her fulvestrant injections and figured since they were not scheduled that they were suddenly moved to every 3 months with her zometa (which was due this week). I reeducated her and she will promptly restart both drugs in early November prior to her procedure at [facility] I have placed orders for CT scans and a bone scan 2 months from now. She will follow up with Dr. MagJana Hakim review these results. She understands and agrees with this plan. She knows the goal of treatment in her case is control. She has been encouraged to call with any issues that might arise before her next visit here.  HeaLaurie PandaP   11/04/2014 3:03 PM

## 2014-11-04 NOTE — Telephone Encounter (Signed)
Appointments made and avs printed for patient °

## 2014-11-12 ENCOUNTER — Other Ambulatory Visit: Payer: Self-pay | Admitting: *Deleted

## 2014-11-12 DIAGNOSIS — C7951 Secondary malignant neoplasm of bone: Secondary | ICD-10-CM

## 2014-11-12 DIAGNOSIS — C50411 Malignant neoplasm of upper-outer quadrant of right female breast: Secondary | ICD-10-CM

## 2014-11-16 ENCOUNTER — Other Ambulatory Visit (HOSPITAL_BASED_OUTPATIENT_CLINIC_OR_DEPARTMENT_OTHER): Payer: BC Managed Care – PPO

## 2014-11-16 ENCOUNTER — Ambulatory Visit (HOSPITAL_BASED_OUTPATIENT_CLINIC_OR_DEPARTMENT_OTHER): Payer: BC Managed Care – PPO

## 2014-11-16 DIAGNOSIS — C7931 Secondary malignant neoplasm of brain: Secondary | ICD-10-CM | POA: Diagnosis not present

## 2014-11-16 DIAGNOSIS — C50811 Malignant neoplasm of overlapping sites of right female breast: Secondary | ICD-10-CM

## 2014-11-16 DIAGNOSIS — C7951 Secondary malignant neoplasm of bone: Secondary | ICD-10-CM | POA: Diagnosis not present

## 2014-11-16 DIAGNOSIS — Z5111 Encounter for antineoplastic chemotherapy: Secondary | ICD-10-CM

## 2014-11-16 DIAGNOSIS — C50411 Malignant neoplasm of upper-outer quadrant of right female breast: Secondary | ICD-10-CM

## 2014-11-16 DIAGNOSIS — C50919 Malignant neoplasm of unspecified site of unspecified female breast: Secondary | ICD-10-CM

## 2014-11-16 LAB — COMPREHENSIVE METABOLIC PANEL (CC13)
ALT: 23 U/L (ref 0–55)
ANION GAP: 9 meq/L (ref 3–11)
AST: 22 U/L (ref 5–34)
Albumin: 3.3 g/dL — ABNORMAL LOW (ref 3.5–5.0)
Alkaline Phosphatase: 91 U/L (ref 40–150)
BUN: 21.1 mg/dL (ref 7.0–26.0)
CHLORIDE: 104 meq/L (ref 98–109)
CO2: 29 meq/L (ref 22–29)
Calcium: 9.3 mg/dL (ref 8.4–10.4)
Creatinine: 1.1 mg/dL (ref 0.6–1.1)
EGFR: 70 mL/min/{1.73_m2} — AB (ref >90)
Glucose: 91 mg/dl (ref 70–140)
Potassium: 4.5 mEq/L (ref 3.5–5.1)
Sodium: 142 mEq/L (ref 136–145)
TOTAL PROTEIN: 6.3 g/dL — AB (ref 6.4–8.3)

## 2014-11-16 LAB — CBC WITH DIFFERENTIAL/PLATELET
BASO%: 0.2 % (ref 0.0–2.0)
BASOS ABS: 0 10*3/uL (ref 0.0–0.1)
EOS ABS: 0 10*3/uL (ref 0.0–0.5)
EOS%: 0.2 % (ref 0.0–7.0)
HCT: 37.2 % (ref 34.8–46.6)
HGB: 12.4 g/dL (ref 11.6–15.9)
LYMPH%: 17 % (ref 14.0–49.7)
MCH: 31.8 pg (ref 25.1–34.0)
MCHC: 33.4 g/dL (ref 31.5–36.0)
MCV: 95.3 fL (ref 79.5–101.0)
MONO#: 0.1 10*3/uL (ref 0.1–0.9)
MONO%: 2.8 % (ref 0.0–14.0)
NEUT#: 2.8 10*3/uL (ref 1.5–6.5)
NEUT%: 79.8 % — AB (ref 38.4–76.8)
PLATELETS: 104 10*3/uL — AB (ref 145–400)
RBC: 3.91 10*6/uL (ref 3.70–5.45)
RDW: 17.3 % — ABNORMAL HIGH (ref 11.2–14.5)
WBC: 3.5 10*3/uL — ABNORMAL LOW (ref 3.9–10.3)
lymph#: 0.6 10*3/uL — ABNORMAL LOW (ref 0.9–3.3)

## 2014-11-16 MED ORDER — ZOLEDRONIC ACID 4 MG/100ML IV SOLN
4.0000 mg | Freq: Once | INTRAVENOUS | Status: AC
  Administered 2014-11-16: 4 mg via INTRAVENOUS
  Filled 2014-11-16: qty 100

## 2014-11-16 MED ORDER — SODIUM CHLORIDE 0.9 % IV SOLN
INTRAVENOUS | Status: DC
  Administered 2014-11-16: 16:00:00 via INTRAVENOUS

## 2014-11-16 MED ORDER — FULVESTRANT 250 MG/5ML IM SOLN
500.0000 mg | Freq: Once | INTRAMUSCULAR | Status: AC
  Administered 2014-11-16: 500 mg via INTRAMUSCULAR
  Filled 2014-11-16: qty 10

## 2014-11-20 HISTORY — PX: OTHER SURGICAL HISTORY: SHX169

## 2014-11-30 ENCOUNTER — Ambulatory Visit: Payer: BC Managed Care – PPO | Attending: Neurosurgery

## 2014-11-30 DIAGNOSIS — R6889 Other general symptoms and signs: Secondary | ICD-10-CM

## 2014-11-30 DIAGNOSIS — R269 Unspecified abnormalities of gait and mobility: Secondary | ICD-10-CM | POA: Diagnosis present

## 2014-11-30 DIAGNOSIS — C50919 Malignant neoplasm of unspecified site of unspecified female breast: Secondary | ICD-10-CM | POA: Insufficient documentation

## 2014-11-30 DIAGNOSIS — R29898 Other symptoms and signs involving the musculoskeletal system: Secondary | ICD-10-CM | POA: Diagnosis present

## 2014-11-30 DIAGNOSIS — R262 Difficulty in walking, not elsewhere classified: Secondary | ICD-10-CM | POA: Diagnosis present

## 2014-11-30 DIAGNOSIS — R531 Weakness: Secondary | ICD-10-CM

## 2014-11-30 DIAGNOSIS — C7931 Secondary malignant neoplasm of brain: Secondary | ICD-10-CM | POA: Insufficient documentation

## 2014-11-30 NOTE — Patient Instructions (Signed)
KNEE: Extension, Long Arc Quad (Weight)  Place weight around leg. Raise leg until knee is straight. Hold _5__ seconds. Use ___ lb weight. _10__ reps per set (each leg), 3 sets per day, __7_ days per week  Copyright  VHI. All rights reserved.    Knee Raise   Lift knee and then lower it. Repeat with other knee. Repeat _10__ times each leg. Do _3_ sessions per day.  http://gt2.exer.us/445   Copyright  VHI. All rights reserved.  Toe Up   Gently rise up on toes and back on heels. Repeat _20___ times. Do 3__ sessions per day.  [place] [address], [place], [place] [address] Phone # [phone] Fax [phone]

## 2014-11-30 NOTE — Therapy (Signed)
[facility] [address], [place], [place], [address] Phone: [phone]   Fax:  [phone]  Physical Therapy Evaluation  Patient Details  Name: Carolyn [name] MRN: [id number] Date of Birth: 1970/03/25 Referring Provider: Roney Mans, FNP  Encounter Date: 11/30/2014      PT End of Session - 11/30/14 1038    Visit Number 1   Date for PT Re-Evaluation 01/25/15   PT Start Time 9476   PT Stop Time 1055   PT Time Calculation (min) 40 min   Activity Tolerance Patient tolerated treatment well   Behavior During Therapy [facility] for tasks assessed/performed      Past Medical History  Diagnosis Date  . 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 Hx of radiation therapy 11/08/11 -12/26/11    right chest wall/supraclav fossa, right scar  . History of chemotherapy 06/27/11 -09/04/11    neoadjuvant  . S/P radiation therapy 05/08/14    SRS brain    Past Surgical History  Procedure Laterality Date  . Wisdom tooth extraction    . Portacath placement  06/20/2011    Procedure: INSERTION PORT-A-CATH;  Surgeon: Haywood Lasso, MD;  Location: [place];  Service: General;  Laterality: N/A;  . Modified mastectomy  10/03/2011    Procedure: MODIFIED MASTECTOMY;  Surgeon: Haywood Lasso, MD;  Location: [place];  Service: General;  Laterality: Right;  . Port-a-cath removal  01/30/2012    Procedure: MINOR REMOVAL PORT-A-CATH;  Surgeon: Haywood Lasso, MD;  Location: [place];  Service: General;  Laterality: Left;  . Breast biopsy      Left  . Robotic assisted total hysterectomy with bilateral salpingo oopherectomy Bilateral 12/23/2012    Procedure: ROBOTIC ASSISTED TOTAL HYSTERECTOMY WITH BILATERAL SALPINGO OOPHORECTOMY;  Surgeon: Marvene Staff, MD;  Location: [facility];  Service: Gynecology;  Laterality: Bilateral;  . Cholecystectomy N/A  03/01/2014    Procedure: LAPAROSCOPIC CHOLECYSTECTOMY;  Surgeon: Leighton Ruff, MD;  [facility];  Service: General;  Laterality: N/A;  . Laminectomy N/A 04/13/2014    Procedure: THORACIC LAMINECTOMY WITH FIXATION THORACIC SIX-THORACIC TEN FUSION;  Surgeon: Kristeen Miss, MD;  Location: [place] [facility];  Service: Neurosurgery;  Laterality: N/A;  . Brain surgery-litt Left 11/20/14    LITT procedure for Lt brain met.    There were no vitals filed for this visit.  Visit Diagnosis:  Weakness generalized - Plan: PT plan of care cert/re-cert  Activity intolerance - Plan: PT plan of care cert/re-cert  Abnormality of gait - Plan: PT plan of care cert/re-cert  Weakness of right leg - Plan: PT plan of care cert/re-cert      Subjective Assessment - 11/30/14 1020    Subjective Pt had brain surgery 11/20/14- LITT procedure for Lt brain tumor removal.  Pt reports that she is now having increased Rt LE weakness.  Pt is walking with walker.  Pt is out of work until the end of November.   Pertinent History thoracic spine surgery for rod placement 04/12/14-Dr Elsner, LITT procedure to remove tumor (metastatic cancer)   Limitations Standing;Walking   How long can you stand comfortably? 5 minutes   How long can you walk comfortably? 5 minutes   Patient Stated Goals Increase strength in legs, walk without walker, stand and walk longer to allow return to work   Currently in Pain? 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  No   Is the patient reluctant to leave their home because of a fear of falling?  No   Home Environment   Living Environment Private residence   Living Arrangements Other relatives   Type of [place] to enter   Entrance Stairs-Number of Steps F[place] One level   Prior Function   Level of Independence Independent   Vocation Full time employment   Vocation Requirements [employer]- not working now   Charity fundraiser Status Within Functional Limits for tasks assessed   ROM / Strength   AROM / PROM / Strength Strength   Strength   Overall Strength Deficits   Strength Assessment Site Hip;Knee;Ankle   Right/Left Hip Right;Left   Right Hip Flexion 4/5   Right Hip ABduction 4+/5   Right Hip ADduction 4+/5   Left Hip Flexion 4+/5   Left Hip ABduction 5/5   Left Hip ADduction 5/5   Right/Left Knee Left;Right   Right Knee Flexion 4-/5   Right Knee Extension 4-/5   Left Knee Flexion 5/5   Left Knee Extension 4+/5   Right/Left Ankle Left;Right   Right Ankle Dorsiflexion 4/5   Left Ankle Plantar Flexion 5/5   Ambulation/Gait   Ambulation/Gait Yes   Ambulation/Gait Assistance 6: Modified independent (Device/Increase time)   Ambulation Distance (Feet) 60 Feet   Assistive device Rolling walker   Gait Pattern Step-through pattern;Decreased stance time - right;Decreased dorsiflexion - right   Ambulation Surface Level   Stairs Yes   Stairs Assistance 5: Supervision   Stair Management Technique One rail Right;Step to pattern                   [facility] Adult PT Treatment/Exercise - 11/30/14 0001    Lumbar Exercises: Aerobic   Stationary Bike Nu step L2 x 6 min  seat 9, arms 10                PT Education - 11/30/14 1037    Education provided Yes   Education Details seated LE strength   Person(s) Educated Patient    Methods Explanation;Demonstration;Handout   Comprehension Verbalized understanding;Returned demonstration          PT Short Term Goals - 11/30/14 1047    PT SHORT TERM GOAL #1   Title be independent in initial HEP   Time 4   Period Weeks   Status New   PT SHORT TERM GOAL #2   Title improve strength and safety to allow to wean from walker to cane 50% of the time in the home   Time 4   Period Weeks   Status New   PT SHORT TERM GOAL #3   Title demonstrate heel strike on the Rt > 50% of the time  Time 4   Status New   PT SHORT TERM GOAL #4   Title stand and walk for 10-15 minutes without need to rest   Time 4   Period Weeks   Status New           PT Long Term Goals - 11/30/14 1048    PT LONG TERM GOAL #1   Title be independent in advanced HEP   Time 8   Period Weeks   Status New   PT LONG TERM GOAL #2   Title wean from walker to use of cane at least 50% in the community   Time 8   Period Weeks   Status New   PT LONG TERM GOAL #3   Title improve Rt LE strength to 4+/5 throughout to improve endurance and allow for improved stafety with negotiating steps   Time 8   Period Weeks   Status New   PT LONG TERM GOAL #4   Title stand and walk for 20-30 minutes without rest to improve endurance for return to work   Time 8   Period Weeks   Status New   PT LONG TERM GOAL #5   Title ascend steps with step-over-step gait with use of 1 rail   Time 8   Status New               Plan - 11/30/14 1039    Clinical Impression Statement Pt presents to PT s/p brain surgery for tumor removal (LITT procedure).  Pt reports that her Rt LE is significantly weaker s/p surgery.  Pt is ambulating all distances with rolling walker, demonstates Rt LE weakness,  is limited to walking and standing 5 minutes max and demonstrates foot drop/steppage gait on the Rt when walking.  Pt will benefit from skilled PT for strength/endurance progression and gait to return to prior level of  function and return to work.    Pt will benefit from skilled therapeutic intervention in order to improve on the following deficits Abnormal gait;Difficulty walking;Decreased endurance;Decreased activity tolerance;Decreased strength;Decreased balance   Rehab Potential Good   PT Frequency 2x / week   PT Duration 8 weeks   PT Treatment/Interventions ADLs/Self Care Home Management;Therapeutic activities;Patient/family education;Therapeutic exercise;Manual techniques;Gait training;Neuromuscular re-education;Functional mobility training;Energy conservation;Stair training   PT Next Visit Plan Rt LE strength, endurance, gait, steps   Consulted and Agree with Plan of Care Patient         Problem List Patient Active Problem List   Diagnosis Date Noted  . Genetic testing 10/15/2014  . Oral thrush 06/23/2014  . Fever 06/17/2014  . Hypokalemia 06/17/2014  . Breast cancer metastasized to brain ([place])   . 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 Malnutrition of moderate degree ([place]) 04/13/2014  . Myelopathy ([place]) 04/12/2014  . Pathologic fracture of thoracic vertebrae 04/12/2014  . Biliary colic 61/53/7943  . Breast cancer of upper-outer quadrant of right female breast ([place]) 09/24/2013  . S/P total hysterectomy and bilateral salpingo-oophorectomy 12/23/2012  . History of chemotherapy   . Hx of radiation therapy     Carolyn [name], PT 11/30/2014, 10:54 AM  [facility] [address] [place], [place], [address] Phone: [phone]   Fax:  [phone]  Name: Carolyn [name] MRN: [id number] Date of Birth: 21-Aug-1970

## 2014-12-07 ENCOUNTER — Encounter: Payer: Self-pay | Admitting: Physical Therapy

## 2014-12-07 ENCOUNTER — Ambulatory Visit: Payer: BC Managed Care – PPO | Admitting: Physical Therapy

## 2014-12-07 DIAGNOSIS — R262 Difficulty in walking, not elsewhere classified: Secondary | ICD-10-CM

## 2014-12-07 DIAGNOSIS — R531 Weakness: Secondary | ICD-10-CM | POA: Diagnosis not present

## 2014-12-07 DIAGNOSIS — C7931 Secondary malignant neoplasm of brain: Secondary | ICD-10-CM

## 2014-12-07 DIAGNOSIS — R269 Unspecified abnormalities of gait and mobility: Secondary | ICD-10-CM

## 2014-12-07 DIAGNOSIS — R29898 Other symptoms and signs involving the musculoskeletal system: Secondary | ICD-10-CM

## 2014-12-07 DIAGNOSIS — R6889 Other general symptoms and signs: Secondary | ICD-10-CM

## 2014-12-07 DIAGNOSIS — C50919 Malignant neoplasm of unspecified site of unspecified female breast: Secondary | ICD-10-CM

## 2014-12-07 NOTE — Therapy (Signed)
[facility] [address], [place], [place], [address] Phone: [phone]   Fax:  [phone]  Physical Therapy Treatment  Patient Details  Name: Carolyn [name] MRN: [id number] Date of Birth: 1970/02/18 Referring Provider: Roney Mans, FNP  Encounter Date: 12/07/2014      PT End of Session - 12/07/14 0854    Visit Number 2   Date for PT Re-Evaluation 01/25/15   PT Start Time 0845   PT Stop Time 0930   PT Time Calculation (min) 45 min   Activity Tolerance Patient tolerated treatment well   Behavior During Therapy [facility] for tasks assessed/performed      Past Medical History  Diagnosis Date  . 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 Hx of radiation therapy 11/08/11 -12/26/11    right chest wall/supraclav fossa, right scar  . History of chemotherapy 06/27/11 -09/04/11    neoadjuvant  . S/P radiation therapy 05/08/14    SRS brain    Past Surgical History  Procedure Laterality Date  . Wisdom tooth extraction    . Portacath placement  06/20/2011    Procedure: INSERTION PORT-A-CATH;  Surgeon: Haywood Lasso, MD;  Location: [place];  Service: General;  Laterality: N/A;  . Modified mastectomy  10/03/2011    Procedure: MODIFIED MASTECTOMY;  Surgeon: Haywood Lasso, MD;  Location: [place];  Service: General;  Laterality: Right;  . Port-a-cath removal  01/30/2012    Procedure: MINOR REMOVAL PORT-A-CATH;  Surgeon: Haywood Lasso, MD;  Location: [place];  Service: General;  Laterality: Left;  . Breast biopsy      Left  . Robotic assisted total hysterectomy with bilateral salpingo oopherectomy Bilateral 12/23/2012    Procedure: ROBOTIC ASSISTED TOTAL HYSTERECTOMY WITH BILATERAL SALPINGO OOPHORECTOMY;  Surgeon: Marvene Staff, MD;  Location: [facility];  Service: Gynecology;  Laterality: Bilateral;  . Cholecystectomy N/A  03/01/2014    Procedure: LAPAROSCOPIC CHOLECYSTECTOMY;  Surgeon: Leighton Ruff, MD;  [facility];  Service: General;  Laterality: N/A;  . Laminectomy N/A 04/13/2014    Procedure: THORACIC LAMINECTOMY WITH FIXATION THORACIC SIX-THORACIC TEN FUSION;  Surgeon: Kristeen Miss, MD;  Location: [facility] [facility];  Service: Neurosurgery;  Laterality: N/A;  . Brain surgery-litt Left 11/20/14    LITT procedure for Lt brain met.    There were no vitals filed for this visit.  Visit Diagnosis:  Weakness generalized  Activity intolerance  Abnormality of gait  Weakness of right leg  Difficulty walking  Breast cancer metastasized to brain, unspecified laterality [facility])      Subjective Assessment - 12/07/14 0850    Subjective No complaints this morning, feeling good.   Currently in Pain? No/denies                         [facility] Adult PT Treatment/Exercise - 12/07/14 0001    Ambulation/Gait   Ambulation Distance (Feet) 120 Feet  2x   Assistive device Rolling walker  Working on increasing RT heel strike   Lumbar Exercises: Aerobic   Stationary Bike L1 x 7 min   Knee/Hip Exercises: Standing   Forward Step Up Right;1 set;10 reps;Hand Hold: 2  On stairs   Other Standing Knee Exercises weight shift on mini tramp 1 min each   Other Standing Knee Exercises Sit to stand 10 x    Knee/Hip Exercises: Seated   Long  Arc Quad Strengthening;Right;1 set;10 reps  Ball squeeze 4 sec hold   Other Seated Knee/Hip Exercises [place] band abduction 2x 10                PT Education - 12/07/14 0901    Education provided Yes   Education Details HEP [place] band hip abd   Person(s) Educated Patient   Methods Explanation;Demonstration;Tactile cues;Verbal cues   Comprehension Verbalized understanding;Returned demonstration          PT Short Term Goals - 12/07/14 0850    PT SHORT TERM GOAL #1   Title be independent in initial HEP   Time 4   Period Weeks   Status Achieved   PT SHORT  TERM GOAL #2   Title improve strength and safety to allow to wean from walker to cane 50% of the time in the home   Time 4   Period Weeks   Status On-going  Just started this week.   PT SHORT TERM GOAL #3   Title demonstrate heel strike on the Rt > 50% of the time   Time 4   Period Weeks   Status On-going  Just started this week.   PT SHORT TERM GOAL #4   Title stand and walk for 10-15 minutes without need to rest   Time 4   Period Weeks   Status On-going  5 min as long as she is leaning on something           PT Long Term Goals - 11/30/14 1048    PT LONG TERM GOAL #1   Title be independent in advanced HEP   Time 8   Period Weeks   Status New   PT LONG TERM GOAL #2   Title wean from walker to use of cane at least 50% in the community   Time 8   Period Weeks   Status New   PT LONG TERM GOAL #3   Title improve Rt LE strength to 4+/5 throughout to improve endurance and allow for improved stafety with negotiating steps   Time 8   Period Weeks   Status New   PT LONG TERM GOAL #4   Title stand and walk for 20-30 minutes without rest to improve endurance for return to work   Time 8   Period Weeks   Status New   PT LONG TERM GOAL #5   Title ascend steps with step-over-step gait with use of 1 rail   Time 8   Status New               Plan - 12/07/14 0916    Clinical Impression Statement First treatment back since surgery. pt performed all exercises with good control, minimal fatigue with reasonabke rets beaks. Needs to concentrate on her heel strike. Weakness apparent in Rt quads. Too soon for goals although pt is compliant in HEP.    Pt will benefit from skilled therapeutic intervention in order to improve on the following deficits Abnormal gait;Difficulty walking;Decreased endurance;Decreased activity tolerance;Decreased strength;Decreased balance   Rehab Potential Good   PT Frequency 2x / week   PT Duration 8 weeks   PT Treatment/Interventions ADLs/Self Care  Home Management;Therapeutic activities;Patient/family education;Therapeutic exercise;Manual techniques;Gait training;Neuromuscular re-education;Functional mobility training;Energy conservation;Stair training   PT Next Visit Plan Rt LE strength, endurance, gait, steps   Consulted and Agree with Plan of Care Patient        Problem List Patient Active Problem List   Diagnosis Date Noted  .  Genetic testing 10/15/2014  . Oral thrush 06/23/2014  . Fever 06/17/2014  . Hypokalemia 06/17/2014  . Breast cancer metastasized to brain ([place])   . 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 Malnutrition of moderate degree ([place]) 04/13/2014  . Myelopathy ([place]) 04/12/2014  . Pathologic fracture of thoracic vertebrae 04/12/2014  . Biliary colic 24/09/7351  . Breast cancer of upper-outer quadrant of right female breast ([place]) 09/24/2013  . S/P total hysterectomy and bilateral salpingo-oophorectomy 12/23/2012  . History of chemotherapy   . Hx of radiation therapy     Yehya Brendle, PTA 12/07/2014, 9:22 AM  [facility] [address], [place], [place], [address] Phone: [phone]   Fax:  [phone]  Name: KYNLI CHOU MRN: [id number] Date of Birth: January 19, 1970

## 2014-12-09 ENCOUNTER — Ambulatory Visit: Payer: BC Managed Care – PPO | Admitting: Physical Therapy

## 2014-12-09 ENCOUNTER — Encounter: Payer: Self-pay | Admitting: Physical Therapy

## 2014-12-09 DIAGNOSIS — R531 Weakness: Secondary | ICD-10-CM | POA: Diagnosis not present

## 2014-12-09 DIAGNOSIS — R6889 Other general symptoms and signs: Secondary | ICD-10-CM

## 2014-12-09 DIAGNOSIS — R269 Unspecified abnormalities of gait and mobility: Secondary | ICD-10-CM

## 2014-12-09 DIAGNOSIS — R29898 Other symptoms and signs involving the musculoskeletal system: Secondary | ICD-10-CM

## 2014-12-09 DIAGNOSIS — R262 Difficulty in walking, not elsewhere classified: Secondary | ICD-10-CM

## 2014-12-09 NOTE — Patient Instructions (Signed)
Abduction: Clam (Eccentric) - Side-Lying    Lie on side with knees bent. Lift top knee, keeping feet together. Keep trunk steady. 10 reps  2- 3 sets repeat 2 -3 per day Add  [place] thea-band.  http://ecce.exer.us/65   Copyright  VHI. All rights reserved.

## 2014-12-09 NOTE — Therapy (Signed)
[facility] [address] [place], [place], [address] Phone: [phone]   Fax:  [phone]  Physical Therapy Treatment  Patient Details  Name: Carolyn [name] MRN: [id number] Date of Birth: 07-17-1970 Referring Provider: Roney Mans, FNP  Encounter Date: 12/09/2014      PT End of Session - 12/09/14 1418    Visit Number 3   Date for PT Re-Evaluation 01/25/15   PT Start Time 1401   PT Stop Time 1445   PT Time Calculation (min) 44 min   Activity Tolerance Patient tolerated treatment well   Behavior During Therapy [facility] for tasks assessed/performed      Past Medical History  Diagnosis Date  . 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 Hx of radiation therapy 11/08/11 -12/26/11    right chest wall/supraclav fossa, right scar  . History of chemotherapy 06/27/11 -09/04/11    neoadjuvant  . S/P radiation therapy 05/08/14    SRS brain    Past Surgical History  Procedure Laterality Date  . Wisdom tooth extraction    . Portacath placement  06/20/2011    Procedure: INSERTION PORT-A-CATH;  Surgeon: Haywood Lasso, MD;  Location: [place];  Service: General;  Laterality: N/A;  . Modified mastectomy  10/03/2011    Procedure: MODIFIED MASTECTOMY;  Surgeon: Haywood Lasso, MD;  Location: [place];  Service: General;  Laterality: Right;  . Port-a-cath removal  01/30/2012    Procedure: MINOR REMOVAL PORT-A-CATH;  Surgeon: Haywood Lasso, MD;  Location: [place];  Service: General;  Laterality: Left;  . Breast biopsy      Left  . Robotic assisted total hysterectomy with bilateral salpingo oopherectomy Bilateral 12/23/2012    Procedure: ROBOTIC ASSISTED TOTAL HYSTERECTOMY WITH BILATERAL SALPINGO OOPHORECTOMY;  Surgeon: Marvene Staff, MD;  Location: [facility];  Service: Gynecology;  Laterality: Bilateral;  . Cholecystectomy N/A  03/01/2014    Procedure: LAPAROSCOPIC CHOLECYSTECTOMY;  Surgeon: Leighton Ruff, MD;  Location: [facility];  Service: General;  Laterality: N/A;  . Laminectomy N/A 04/13/2014    Procedure: THORACIC LAMINECTOMY WITH FIXATION THORACIC SIX-THORACIC TEN FUSION;  Surgeon: Kristeen Miss, MD;  Location: [facility];  Service: Neurosurgery;  Laterality: N/A;  . Brain surgery-litt Left 11/20/14    LITT procedure for Lt brain met.    There were no vitals filed for this visit.  Visit Diagnosis:  Weakness generalized  Activity intolerance  Abnormality of gait  Weakness of right leg  Difficulty walking      Subjective Assessment - 12/09/14 1408    Subjective Pt is feeling good, she is back to work since yesterday.    Currently in Pain? No/denies   Multiple Pain Sites No                         [facility] Adult PT Treatment/Exercise - 12/09/14 0001    Ambulation/Gait   Ambulation/Gait Yes   Ambulation/Gait Assistance 6: Modified independent (Device/Increase time)   Ambulation Distance (Feet) 120 Feet  x 2    Assistive device Straight cane   Gait Pattern Step-through pattern;Decreased stance time - right;Decreased dorsiflexion - right   Ambulation Surface Level   Stairs Yes   Stairs Assistance 5: Supervision   Stair Management Technique Step to pattern;Two rails   Number of Stairs 16   Gait Comments stairs for strengthening   Exercises  Exercises Lumbar   Lumbar Exercises: Aerobic   Stationary Bike L1 x 8 min   Lumbar Exercises: Sidelying   Clam 10 reps  each side   Hip Abduction 10 reps  each side   Knee/Hip Exercises: Standing   Forward Step Up Right;2 sets;10 reps;Hand Hold: 2  on stairs   Other Standing Knee Exercises 3 directions weight shift on mini tramp 1 min each   Other Standing Knee Exercises Sit to stand 10 x    Knee/Hip Exercises: Seated   Long Arc Quad Strengthening;Right;10 reps;2 sets  4sec hold   Other Seated Knee/Hip Exercises Leibold band abduction 2x  10  ball squezzes 2 x 10                PT Education - 12/09/14 1445    Education provided Yes   Education Details Clam    Person(s) Educated Patient   Methods Explanation;Demonstration   Comprehension Verbalized understanding;Returned demonstration          PT Short Term Goals - 12/07/14 0850    PT SHORT TERM GOAL #1   Title be independent in initial HEP   Time 4   Period Weeks   Status Achieved   PT SHORT TERM GOAL #2   Title improve strength and safety to allow to wean from walker to cane 50% of the time in the home   Time 4   Period Weeks   Status On-going  Just started this week.   PT SHORT TERM GOAL #3   Title demonstrate heel strike on the Rt > 50% of the time   Time 4   Period Weeks   Status On-going  Just started this week.   PT SHORT TERM GOAL #4   Title stand and walk for 10-15 minutes without need to rest   Time 4   Period Weeks   Status On-going  5 min as long as she is leaning on something           PT Long Term Goals - 11/30/14 1048    PT LONG TERM GOAL #1   Title be independent in advanced HEP   Time 8   Period Weeks   Status New   PT LONG TERM GOAL #2   Title wean from walker to use of cane at least 50% in the community   Time 8   Period Weeks   Status New   PT LONG TERM GOAL #3   Title improve Rt LE strength to 4+/5 throughout to improve endurance and allow for improved stafety with negotiating steps   Time 8   Period Weeks   Status New   PT LONG TERM GOAL #4   Title stand and walk for 20-30 minutes without rest to improve endurance for return to work   Time 8   Period Weeks   Status New   PT LONG TERM GOAL #5   Title ascend steps with step-over-step gait with use of 1 rail   Time 8   Status New               Plan - 12/09/14 1419    Clinical Impression Statement Pt with good determination toward Physical Therapy. Pt with decreased control right LE what leads to unsteady gait, and challenging desending stairs  step over step using both UE. Pt will continue to benefit from skilled PT.    Pt will benefit from skilled therapeutic intervention in order to improve on the following deficits Abnormal  gait;Difficulty walking;Decreased endurance;Decreased activity tolerance;Decreased strength;Decreased balance   Rehab Potential Good   PT Frequency 2x / week   PT Duration 8 weeks   PT Treatment/Interventions ADLs/Self Care Home Management;Therapeutic activities;Patient/family education;Therapeutic exercise;Manual techniques;Gait training;Neuromuscular re-education;Functional mobility training;Energy conservation;Stair training   PT Next Visit Plan Rt LE strength, endurance, gait, steps   Consulted and Agree with Plan of Care Patient        Problem List Patient Active Problem List   Diagnosis Date Noted  . Genetic testing 10/15/2014  . Oral thrush 06/23/2014  . Fever 06/17/2014  . Hypokalemia 06/17/2014  . Breast cancer metastasized to brain ([place])   . 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 Malnutrition of moderate degree ([place]) 04/13/2014  . Myelopathy ([place]) 04/12/2014  . Pathologic fracture of thoracic vertebrae 04/12/2014  . Biliary colic 78/41/2820  . Breast cancer of upper-outer quadrant of right female breast ([place]) 09/24/2013  . S/P total hysterectomy and bilateral salpingo-oophorectomy 12/23/2012  . History of chemotherapy   . Hx of radiation therapy     NAUMANN-HOUEGNIFIO,Jaelin Devincentis PTA 12/09/2014, 2:48 PM  [facility] [address] [place], [place], [address] Phone: [phone]   Fax:  [phone]  Name: Carolyn [name] MRN: [id number] Date of Birth: 02/02/1970

## 2014-12-10 ENCOUNTER — Other Ambulatory Visit: Payer: Self-pay

## 2014-12-10 DIAGNOSIS — C50411 Malignant neoplasm of upper-outer quadrant of right female breast: Secondary | ICD-10-CM

## 2014-12-14 ENCOUNTER — Ambulatory Visit (HOSPITAL_BASED_OUTPATIENT_CLINIC_OR_DEPARTMENT_OTHER): Payer: BC Managed Care – PPO

## 2014-12-14 ENCOUNTER — Other Ambulatory Visit (HOSPITAL_BASED_OUTPATIENT_CLINIC_OR_DEPARTMENT_OTHER): Payer: BC Managed Care – PPO

## 2014-12-14 VITALS — BP 110/66 | HR 103 | Temp 98.9°F

## 2014-12-14 DIAGNOSIS — C50811 Malignant neoplasm of overlapping sites of right female breast: Secondary | ICD-10-CM

## 2014-12-14 DIAGNOSIS — Z5111 Encounter for antineoplastic chemotherapy: Secondary | ICD-10-CM

## 2014-12-14 DIAGNOSIS — C7951 Secondary malignant neoplasm of bone: Secondary | ICD-10-CM | POA: Diagnosis not present

## 2014-12-14 DIAGNOSIS — C50919 Malignant neoplasm of unspecified site of unspecified female breast: Secondary | ICD-10-CM

## 2014-12-14 DIAGNOSIS — C50411 Malignant neoplasm of upper-outer quadrant of right female breast: Secondary | ICD-10-CM

## 2014-12-14 DIAGNOSIS — C7931 Secondary malignant neoplasm of brain: Secondary | ICD-10-CM | POA: Diagnosis not present

## 2014-12-14 LAB — CBC WITH DIFFERENTIAL/PLATELET
BASO%: 0.5 % (ref 0.0–2.0)
Basophils Absolute: 0 10*3/uL (ref 0.0–0.1)
EOS ABS: 0 10*3/uL (ref 0.0–0.5)
EOS%: 1 % (ref 0.0–7.0)
HCT: 31.3 % — ABNORMAL LOW (ref 34.8–46.6)
HEMOGLOBIN: 10.8 g/dL — AB (ref 11.6–15.9)
LYMPH%: 21.9 % (ref 14.0–49.7)
MCH: 33 pg (ref 25.1–34.0)
MCHC: 34.5 g/dL (ref 31.5–36.0)
MCV: 95.7 fL (ref 79.5–101.0)
MONO#: 0.2 10*3/uL (ref 0.1–0.9)
MONO%: 6.8 % (ref 0.0–14.0)
NEUT%: 69.8 % (ref 38.4–76.8)
NEUTROS ABS: 1.7 10*3/uL (ref 1.5–6.5)
Platelets: 142 10*3/uL — ABNORMAL LOW (ref 145–400)
RBC: 3.27 10*6/uL — AB (ref 3.70–5.45)
RDW: 17.6 % — ABNORMAL HIGH (ref 11.2–14.5)
WBC: 2.4 10*3/uL — ABNORMAL LOW (ref 3.9–10.3)
lymph#: 0.5 10*3/uL — ABNORMAL LOW (ref 0.9–3.3)

## 2014-12-14 LAB — COMPREHENSIVE METABOLIC PANEL
ALBUMIN: 3.7 g/dL (ref 3.5–5.0)
ALK PHOS: 83 U/L (ref 40–150)
ALT: 13 U/L (ref 0–55)
AST: 17 U/L (ref 5–34)
Anion Gap: 9 mEq/L (ref 3–11)
BILIRUBIN TOTAL: 0.34 mg/dL (ref 0.20–1.20)
BUN: 8 mg/dL (ref 7.0–26.0)
CO2: 28 meq/L (ref 22–29)
Calcium: 9.7 mg/dL (ref 8.4–10.4)
Chloride: 106 mEq/L (ref 98–109)
Creatinine: 0.9 mg/dL (ref 0.6–1.1)
GLUCOSE: 91 mg/dL (ref 70–140)
Potassium: 4.2 mEq/L (ref 3.5–5.1)
SODIUM: 143 meq/L (ref 136–145)
TOTAL PROTEIN: 6.9 g/dL (ref 6.4–8.3)

## 2014-12-14 MED ORDER — FULVESTRANT 250 MG/5ML IM SOLN
500.0000 mg | Freq: Once | INTRAMUSCULAR | Status: AC
  Administered 2014-12-14: 500 mg via INTRAMUSCULAR
  Filled 2014-12-14: qty 10

## 2014-12-15 ENCOUNTER — Encounter: Payer: Self-pay | Admitting: Physical Therapy

## 2014-12-15 ENCOUNTER — Ambulatory Visit: Payer: BC Managed Care – PPO | Attending: Neurosurgery | Admitting: Physical Therapy

## 2014-12-15 DIAGNOSIS — R262 Difficulty in walking, not elsewhere classified: Secondary | ICD-10-CM | POA: Diagnosis present

## 2014-12-15 DIAGNOSIS — R531 Weakness: Secondary | ICD-10-CM | POA: Diagnosis present

## 2014-12-15 DIAGNOSIS — R269 Unspecified abnormalities of gait and mobility: Secondary | ICD-10-CM | POA: Diagnosis present

## 2014-12-15 DIAGNOSIS — R29898 Other symptoms and signs involving the musculoskeletal system: Secondary | ICD-10-CM

## 2014-12-15 DIAGNOSIS — R6889 Other general symptoms and signs: Secondary | ICD-10-CM

## 2014-12-15 NOTE — Therapy (Signed)
[facility] [address] [place], [place], [address] Phone: [phone]   Fax:  [phone]  Physical Therapy Treatment  Patient Details  Name: Carolyn [name] MRN: [id number] Date of Birth: [birth year] Referring Provider: Roney Mans, FNP  Encounter Date: 12/15/2014      PT End of Session - 12/15/14 1455    Visit Number 4   Date for PT Re-Evaluation 01/25/15   PT Start Time 1440   PT Stop Time 1528   PT Time Calculation (min) 48 min   Activity Tolerance Patient tolerated treatment well   Behavior During Therapy [facility] for tasks assessed/performed      Past Medical History  Diagnosis Date  . 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 Hx of radiation therapy 11/08/11 -12/26/11    right chest wall/supraclav fossa, right scar  . History of chemotherapy 06/27/11 -09/04/11    neoadjuvant  . S/P radiation therapy 05/08/14    SRS brain    Past Surgical History  Procedure Laterality Date  . Wisdom tooth extraction    . Portacath placement  06/20/2011    Procedure: INSERTION PORT-A-CATH;  Surgeon: Haywood Lasso, MD;  Location: [place];  Service: General;  Laterality: N/A;  . Modified mastectomy  10/03/2011    Procedure: MODIFIED MASTECTOMY;  Surgeon: Haywood Lasso, MD;  Location: [place];  Service: General;  Laterality: Right;  . Port-a-cath removal  01/30/2012    Procedure: MINOR REMOVAL PORT-A-CATH;  Surgeon: Haywood Lasso, MD;  Location: [place];  Service: General;  Laterality: Left;  . Breast biopsy      Left  . Robotic assisted total hysterectomy with bilateral salpingo oopherectomy Bilateral 12/23/2012    Procedure: ROBOTIC ASSISTED TOTAL HYSTERECTOMY WITH BILATERAL SALPINGO OOPHORECTOMY;  Surgeon: Marvene Staff, MD;  Location: [facility];  Service: Gynecology;  Laterality: Bilateral;  . Cholecystectomy N/A  03/01/2014    Procedure: LAPAROSCOPIC CHOLECYSTECTOMY;  Surgeon: Leighton Ruff, MD;  [facility];  Service: General;  Laterality: N/A;  . Laminectomy N/A 04/13/2014    Procedure: THORACIC LAMINECTOMY WITH FIXATION THORACIC SIX-THORACIC TEN FUSION;  Surgeon: Kristeen Miss, MD;  Location: [place];  Service: Neurosurgery;  Laterality: N/A;  . Brain surgery-litt Left 11/20/14    LITT procedure for Lt brain met.    There were no vitals filed for this visit.  Visit Diagnosis:  Weakness generalized  Activity intolerance  Abnormality of gait  Weakness of right leg  Difficulty walking      Subjective Assessment - 12/15/14 1452    Subjective Pt reports improvement with walking, legs are less wobbely. then they have used to be   Pertinent History thoracic spine surgery for rod placement 04/12/14-Dr Elsner, LITT procedure to remove tumor (metastatic cancer)   Currently in Pain? No/denies                         [facility] Adult PT Treatment/Exercise - 12/15/14 0001    Ambulation/Gait   Ambulation/Gait Yes   Ambulation/Gait Assistance 6: Modified independent (Device/Increase time)   Ambulation Distance (Feet) 120 Feet  x2   Assistive device Straight cane   Gait Pattern Step-through pattern;Decreased stance time - right;Decreased dorsiflexion - right   Ambulation Surface Level   Stairs --   Stairs Assistance --   Gait Comments 1st round 41mn8sec,2nd 152m 4sec with 1.5# weight  Exercises   Exercises Lumbar   Lumbar Exercises: Aerobic   Stationary Bike L1 x 8 min   Lumbar Exercises: Standing   Other Standing Lumbar Exercises Sidestepping along kitchencounter 64mn with B UE support   Knee/Hip Exercises: Standing   Forward Step Up Both;2 sets;Hand Hold: 2;Step Height: 6"  pt shaky with legs   Other Standing Knee Exercises Rebounder 3 directions x 1 min each with 2 Handhold    Knee/Hip Exercises: Seated   Long Arc Quad Strengthening;Right;10 reps;2 sets   Other  Seated Knee/Hip Exercises Ballsuqezzes 2x10 with 5 sec hold                PT Education - 12/15/14 1516    Education provided Yes   Education Details Clam, sidestepping on kitchen counter   Person(s) Educated Patient   Methods Explanation;Demonstration   Comprehension Returned demonstration          PT Short Term Goals - 12/15/14 1459    PT SHORT TERM GOAL #1   Title be independent in initial HEP   Time 4   Period Weeks   Status Achieved   PT SHORT TERM GOAL #2   Title improve strength and safety to allow to wean from walker to cane 50% of the time in the home   Time 4   Period Weeks   Status On-going   PT SHORT TERM GOAL #3   Title demonstrate heel strike on the Rt > 50% of the time   Time 4   Period Weeks   Status On-going   PT SHORT TERM GOAL #4   Title stand and walk for 10-15 minutes without need to rest  as of 12/15/14  561m up to 10, but this is pushing it   Time 4   Period Weeks   Status On-going           PT Long Term Goals - 11/30/14 1048    PT LONG TERM GOAL #1   Title be independent in advanced HEP   Time 8   Period Weeks   Status New   PT LONG TERM GOAL #2   Title wean from walker to use of cane at least 50% in the community   Time 8   Period Weeks   Status New   PT LONG TERM GOAL #3   Title improve Rt LE strength to 4+/5 throughout to improve endurance and allow for improved stafety with negotiating steps   Time 8   Period Weeks   Status New   PT LONG TERM GOAL #4   Title stand and walk for 20-30 minutes without rest to improve endurance for return to work   Time 8   Period Weeks   Status New   PT LONG TERM GOAL #5   Title ascend steps with step-over-step gait with use of 1 rail   Time 8   Status New               Plan - 12/15/14 1456    Clinical Impression Statement Pt was shaky with exercises at time due to motorcontrol and muscle weakness   Pt will benefit from skilled therapeutic intervention in order to improve  on the following deficits Abnormal gait;Difficulty walking;Decreased endurance;Decreased activity tolerance;Decreased strength;Decreased balance   Rehab Potential Good   PT Frequency 2x / week   PT Duration 8 weeks   PT Treatment/Interventions ADLs/Self Care Home Management;Therapeutic activities;Patient/family education;Therapeutic exercise;Manual techniques;Gait training;Neuromuscular re-education;Functional mobility training;Energy conservation;Stair training  PT Next Visit Plan Rt LE strength, endurance, gait, steps   Consulted and Agree with Plan of Care Patient        Problem List Patient Active Problem List   Diagnosis Date Noted  . Genetic testing 10/15/2014  . Oral thrush 06/23/2014  . Fever 06/17/2014  . Hypokalemia 06/17/2014  . Breast cancer metastasized to brain ([place])   . 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 Malnutrition of moderate degree ([place]) 04/13/2014  . Myelopathy ([place]) 04/12/2014  . Pathologic fracture of thoracic vertebrae 04/12/2014  . Biliary colic 79/48/0165  . Breast cancer of upper-outer quadrant of right female breast ([place]) 09/24/2013  . S/P total hysterectomy and bilateral salpingo-oophorectomy 12/23/2012  . History of chemotherapy   . Hx of radiation therapy     NAUMANN-HOUEGNIFIO,Nykiah Ma PTA 12/15/2014, 3:29 PM   [facility] [address] [place], [place], [address] Phone: [phone]   Fax:  [phone]  Name: Carolyn [name] MRN: [id number] Date of Birth: Jun 04, 1970

## 2014-12-15 NOTE — Patient Instructions (Signed)
Side-Stepping    Walk to left side with eyes open. Take even steps, leading with same foot. Make sure each foot lifts off the floor. Repeat in opposite direction. Repeat for _2  minutes per session. Do _2  sessions per day. Perform at [employer] Abduction: Clam (Eccentric) - Side-Lying    Lie on side with knees bent. Lift top knee, keeping feet together. Keep trunk steady. Slowly lower. 10 reps per set,  2-sets per, repeat 2 x per day. If getting stronger add thera -band...  http://ecce.exer.us/65   Copyright  VHI. All rights reserved.    Copyright  VHI. All rights reserved.

## 2014-12-16 ENCOUNTER — Encounter (HOSPITAL_COMMUNITY)
Admission: RE | Admit: 2014-12-16 | Discharge: 2014-12-16 | Disposition: A | Discharge status: Home / Self Care | Payer: BC Managed Care – PPO | Source: Ambulatory Visit | Attending: Nurse Practitioner | Admitting: Nurse Practitioner

## 2014-12-16 ENCOUNTER — Ambulatory Visit (HOSPITAL_COMMUNITY)
Admission: RE | Admit: 2014-12-16 | Discharge: 2014-12-16 | Disposition: A | Discharge status: Home / Self Care | Payer: BC Managed Care – PPO | Source: Ambulatory Visit | Attending: Nurse Practitioner | Admitting: Nurse Practitioner

## 2014-12-16 ENCOUNTER — Encounter (HOSPITAL_COMMUNITY): Payer: Self-pay

## 2014-12-16 DIAGNOSIS — Z9221 Personal history of antineoplastic chemotherapy: Secondary | ICD-10-CM | POA: Insufficient documentation

## 2014-12-16 DIAGNOSIS — K769 Liver disease, unspecified: Secondary | ICD-10-CM | POA: Diagnosis not present

## 2014-12-16 DIAGNOSIS — C7951 Secondary malignant neoplasm of bone: Secondary | ICD-10-CM | POA: Diagnosis not present

## 2014-12-16 DIAGNOSIS — R918 Other nonspecific abnormal finding of lung field: Secondary | ICD-10-CM | POA: Insufficient documentation

## 2014-12-16 DIAGNOSIS — C50411 Malignant neoplasm of upper-outer quadrant of right female breast: Secondary | ICD-10-CM

## 2014-12-16 DIAGNOSIS — Z923 Personal history of irradiation: Secondary | ICD-10-CM | POA: Diagnosis not present

## 2014-12-16 MED ORDER — IOHEXOL 300 MG/ML  SOLN
100.0000 mL | Freq: Once | INTRAMUSCULAR | Status: AC | PRN
  Administered 2014-12-16: 100 mL via INTRAVENOUS

## 2014-12-16 MED ORDER — TECHNETIUM TC 99M MEDRONATE IV KIT
25.8000 | PACK | Freq: Once | INTRAVENOUS | Status: AC | PRN
  Administered 2014-12-16: 25.8 via INTRAVENOUS

## 2014-12-17 ENCOUNTER — Other Ambulatory Visit: Payer: Self-pay

## 2014-12-17 DIAGNOSIS — C7951 Secondary malignant neoplasm of bone: Secondary | ICD-10-CM

## 2014-12-17 DIAGNOSIS — C50411 Malignant neoplasm of upper-outer quadrant of right female breast: Secondary | ICD-10-CM

## 2014-12-17 DIAGNOSIS — C7931 Secondary malignant neoplasm of brain: Secondary | ICD-10-CM

## 2014-12-17 DIAGNOSIS — C50919 Malignant neoplasm of unspecified site of unspecified female breast: Secondary | ICD-10-CM

## 2014-12-21 ENCOUNTER — Telehealth: Payer: Self-pay | Admitting: Oncology

## 2014-12-21 ENCOUNTER — Ambulatory Visit (HOSPITAL_BASED_OUTPATIENT_CLINIC_OR_DEPARTMENT_OTHER): Payer: BC Managed Care – PPO | Admitting: Oncology

## 2014-12-21 ENCOUNTER — Other Ambulatory Visit (HOSPITAL_BASED_OUTPATIENT_CLINIC_OR_DEPARTMENT_OTHER): Payer: BC Managed Care – PPO

## 2014-12-21 VITALS — BP 124/73 | HR 112 | Temp 98.8°F | Resp 18 | Ht 68.0 in | Wt 127.8 lb

## 2014-12-21 DIAGNOSIS — C50411 Malignant neoplasm of upper-outer quadrant of right female breast: Secondary | ICD-10-CM

## 2014-12-21 DIAGNOSIS — M899 Disorder of bone, unspecified: Secondary | ICD-10-CM

## 2014-12-21 DIAGNOSIS — C50919 Malignant neoplasm of unspecified site of unspecified female breast: Secondary | ICD-10-CM

## 2014-12-21 DIAGNOSIS — K769 Liver disease, unspecified: Secondary | ICD-10-CM

## 2014-12-21 DIAGNOSIS — C50911 Malignant neoplasm of unspecified site of right female breast: Secondary | ICD-10-CM

## 2014-12-21 DIAGNOSIS — C773 Secondary and unspecified malignant neoplasm of axilla and upper limb lymph nodes: Secondary | ICD-10-CM

## 2014-12-21 DIAGNOSIS — C7951 Secondary malignant neoplasm of bone: Secondary | ICD-10-CM

## 2014-12-21 DIAGNOSIS — C7931 Secondary malignant neoplasm of brain: Secondary | ICD-10-CM | POA: Diagnosis not present

## 2014-12-21 DIAGNOSIS — Z170 Estrogen receptor positive status [ER+]: Secondary | ICD-10-CM

## 2014-12-21 LAB — COMPREHENSIVE METABOLIC PANEL
ALT: 10 U/L (ref 0–55)
AST: 17 U/L (ref 5–34)
Albumin: 3.8 g/dL (ref 3.5–5.0)
Alkaline Phosphatase: 89 U/L (ref 40–150)
Anion Gap: 9 mEq/L (ref 3–11)
BUN: 7.6 mg/dL (ref 7.0–26.0)
CO2: 28 meq/L (ref 22–29)
Calcium: 9.2 mg/dL (ref 8.4–10.4)
Chloride: 106 mEq/L (ref 98–109)
Creatinine: 0.8 mg/dL (ref 0.6–1.1)
GLUCOSE: 99 mg/dL (ref 70–140)
Potassium: 3.5 mEq/L (ref 3.5–5.1)
SODIUM: 143 meq/L (ref 136–145)
TOTAL PROTEIN: 6.9 g/dL (ref 6.4–8.3)

## 2014-12-21 LAB — CBC WITH DIFFERENTIAL/PLATELET
BASO%: 0.8 % (ref 0.0–2.0)
BASOS ABS: 0 10*3/uL (ref 0.0–0.1)
EOS ABS: 0 10*3/uL (ref 0.0–0.5)
EOS%: 1.4 % (ref 0.0–7.0)
HCT: 30.5 % — ABNORMAL LOW (ref 34.8–46.6)
HGB: 10.3 g/dL — ABNORMAL LOW (ref 11.6–15.9)
LYMPH#: 0.6 10*3/uL — AB (ref 0.9–3.3)
LYMPH%: 27.9 % (ref 14.0–49.7)
MCH: 33.1 pg (ref 25.1–34.0)
MCHC: 33.8 g/dL (ref 31.5–36.0)
MCV: 97.8 fL (ref 79.5–101.0)
MONO#: 0.4 10*3/uL (ref 0.1–0.9)
MONO%: 16.7 % — AB (ref 0.0–14.0)
NEUT%: 53.2 % (ref 38.4–76.8)
NEUTROS ABS: 1.2 10*3/uL — AB (ref 1.5–6.5)
Platelets: 144 10*3/uL — ABNORMAL LOW (ref 145–400)
RBC: 3.12 10*6/uL — AB (ref 3.70–5.45)
RDW: 18.6 % — AB (ref 11.2–14.5)
WBC: 2.3 10*3/uL — ABNORMAL LOW (ref 3.9–10.3)

## 2014-12-21 NOTE — Telephone Encounter (Signed)
Appointments made and avs printed for patient °

## 2014-12-21 NOTE — Progress Notes (Signed)
[name]  [phone] [phone]     ID: WEN MUNFORD DOB: 1970-05-22  MR#: [id number]  RFF#:[id number]  Patient Care Team: [name], MD as PCP - General (Family Medicine) Thea Silversmith, MD (Radiation Oncology) Neldon Mc, MD as Surgeon (General Surgery) Chauncey Cruel, MD as Consulting Physician (Oncology) OTHER M.D.JN Susanne Greenhouse, Electa Sniff Tatter MD  CHIEF COMPLAINT: Estrogen receptor positive breast cancer  CURRENT TREATMENT: fulvestrant, palbociclib, zolendronate  BREAST CANCER HISTORY: From doctor Khan's of original intake node 05/31/2011:  "Carolyn [name] is a 44 y.o. female. Without significant past medical history. She underwent a mammogram that showed calcifications measuring 5 cm on the right breast. She then went on To have an ultrasound which showed the area to measure 2.9 cm. She was also found to have a right axillary lymph node that was suspicious for a malignancy. She had a biopsy of the calcifications that showed high-grade ductal carcinoma in situ. 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 In the lymph node biopsy there was no lymphatic tissue seen and was felt that the node was replaced by tumor. Patient went on to have an MRI of the bilateral breasts performed on evening of 05/30/2011. The MRI showed in the right breast 5 x 3 x 4.5 cm mass abutting the chest wall. About 7 cm away from this there was another mass in the upper inner quadrant that measured 1.5 x 1.7 x 1.5 cm. On the contralateral breast, that is the left breast a 2 cm area suspicious enhancement was noted also this up to date has not been biopsied and arrangements are being made for the biopsy to be performed. In this side there were no suspicious lymph nodes. The prognostic panel is pending. Patient is otherwise without any complaints. "  METASTATIC DISEASE: From the earlier summary  note:  Carolyn [name] noted some strange feelings around her umbilicus 35/70/1779. This felt like an area of numbness. Over the next 2 days she noted some leg weakness and difficulty walking, so she presented to the ED 04/12/2014. MRI of the thoraco-lumbar spine was obtained 04/12/2014 showing multiple bone lesions and compression fracture at T8 with retropulsion and cord compression. On 04/13/2014 she underwent Laminectomy T8 decompression of spinal cord posterior fixation from T6-T10 with pedicle screws and rods posterior arthrodesis with allograft. The pathology from this procedure ([id number]) showed metastatic adenocarcinoma which was estrogen receptor 69% positive, with moderate staining intensity, progesterone receptor negative. HER-2 could not be obtained.  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 There were nonspecific L temporal bone changes and also possible involvement of the clivus and calvarium, but no other parenchymal brain lesions. 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 She is currently on her "off" week from the Palbociclib, which she takes 3 weeks on and one-week off at 100 mg daily. She continues on fulvestrant monthly, with the most recent dose 12/14/2014. She tolerates both drugs with no side effects that she is  aware of. She does not mind the injections. She receives zolendronate every 3 months, with the next dose due in February. That also causes her no problems  On 11/20/2014 she underwent radiosurgery at [facility] under  [place]. She was released within 24 hours and did "terrific". She came off dexamethasone 12/09/2014. She is back to work full-time.  REVIEW OF SYSTEMS: Carolyn [name] denies headaches, nausea, vomiting, dizziness, gait imbalance, or visual changes. She does have some hot flashes and night sweats. These are "not a major problem". She has seasonal allergies with some runny nose and sinus symptoms, but no cough. She developed a rash, likely from the dexamethasone, and this is clearing. She still uses a cane for walking but she gets around "well". She has numbness take early involving the right hand and right leg. A detailed review of systems today was otherwise stable. anges, or weakness. She denies pain. Bowel movements and bladder control are normal. A detailed review of system is otherwise stable.  PAST MEDICAL HISTORY: Past Medical History  Diagnosis Date  . Breast cancer ([place]) 09/2011    invasive ductal carcinoma metastatic ca in 3/14 lymph nodes  . Hx of radiation therapy 11/08/11 -12/26/11    right chest wall/supraclav fossa, right scar  . History of chemotherapy 06/27/11 -09/04/11    neoadjuvant  . S/P radiation therapy 05/08/14    SRS brain    PAST SURGICAL HISTORY: Past Surgical History  Procedure Laterality Date  . Wisdom tooth extraction    . Portacath placement  06/20/2011    Procedure: INSERTION PORT-A-CATH;  Surgeon: Haywood Lasso, MD;  Location: [facility];  Service: General;  Laterality: N/A;  . Modified mastectomy  10/03/2011    Procedure: MODIFIED MASTECTOMY;  Surgeon: Haywood Lasso, MD;  Location: [facility];  Service: General;  Laterality: Right;  . Port-a-cath removal  01/30/2012    Procedure: MINOR REMOVAL PORT-A-CATH;  Surgeon: Haywood Lasso, MD;  Location: [facility];  Service: General;  Laterality: Left;  . Breast biopsy      Left  . Robotic assisted total hysterectomy with bilateral salpingo oopherectomy Bilateral 12/23/2012     Procedure: ROBOTIC ASSISTED TOTAL HYSTERECTOMY WITH BILATERAL SALPINGO OOPHORECTOMY;  Surgeon: Marvene Staff, MD;  Location: [facility];  Service: Gynecology;  Laterality: Bilateral;  . Cholecystectomy N/A 03/01/2014    Procedure: LAPAROSCOPIC CHOLECYSTECTOMY;  Surgeon: Leighton Ruff, MD;  Location: [facility];  Service: General;  Laterality: N/A;  . Laminectomy N/A 04/13/2014    Procedure: THORACIC LAMINECTOMY WITH FIXATION THORACIC SIX-THORACIC TEN FUSION;  Surgeon: Kristeen Miss, MD;  Location: [facility];  Service: Neurosurgery;  Laterality: N/A;  . Brain surgery-litt Left 11/20/14    LITT procedure for Lt brain met.    FAMILY HISTORY Family History  Problem Relation Age of Onset  . Breast cancer Maternal Aunt 20  . Pancreatic cancer Maternal Grandfather   . Pancreatic cancer Maternal Aunt     died in her 56s  . Leukemia Maternal Aunt     died in her 85s  . Ovarian cancer Cousin 33    maternal cousin   the patient's father is alive, currently 25 years old. The patient's mother died from complications of diabetes at the age of 78. The patient had no brothers, 4 sisters. One sister has died from congestive heart failure. The patient's mother is one of 5 sisters. One of them was diagnosed with breast cancer the age of 75. Also a cousin on the maternal  side it was diagnosed with ovarian cancer. This was approximately age 36. There is also colon cancer in the family. The patient has had extensive genetic testing summarized below. She is however BRCA negative  GYNECOLOGIC HISTORY:  Patient's last menstrual period was 07/02/2011. Menarche age 75, she is GX P0. She underwent total hysterectomy with bilateral salpingo-oophorectomy December 2014.  SOCIAL HISTORY:  Carolyn [name] works at [employer] for [employer]. She mostly deals with government documents. She is single, lives by herself, with no pets. She attends [employer] locally.    ADVANCED DIRECTIVES: Not in place. The patient has a documents and  intends to name her sister Carolyn [name] as healthcare power of attorney. Carolyn [name] lives in [place] and can be reached at [place]: Social History  Substance Use Topics  . Smoking status: Never Smoker   . Smokeless tobacco: Never Used  . Alcohol Use: No     Colonoscopy:  PAP:  Bone density:  Lipid panel:  Allergies  Allergen Reactions  . Allegra [Fexofenadine] Hives    Abdomen only  . Shellfish Allergy Hives    Abdomen only    Current Outpatient Prescriptions  Medication Sig Dispense Refill  . acetaminophen (TYLENOL) 325 MG tablet Take 2 tablets (650 mg total) by mouth every 6 (six) hours as needed for mild pain, moderate pain, fever or headache.    . Calcium Carbonate-Vit D-Min (CALCIUM 1200 PO) Take 1 tablet by mouth 2 (two) times daily.    . Cholecalciferol (VITAMIN D3) 2000 UNITS TABS Take 2,000 Units by mouth daily.    Marland Kitchen dexamethasone (DECADRON) 4 MG tablet Take 1 tablet (4 mg total) by mouth 2 (two) times daily with a meal. 60 tablet 0  . Multiple Vitamin (MULTIVITAMIN) tablet Take 1 tablet by mouth daily.    . palbociclib (IBRANCE) 100 MG capsule Take 1 capsule (100 mg total) by mouth daily with breakfast. Take whole with food. Take daily for 21 days and then off for 7 days. 21 capsule 3   No current facility-administered medications for this visit.    OBJECTIVE: Middle-aged [place] American woman who appears stated age 56 Vitals:   12/21/14 1542  BP: 124/73  Pulse: 112  Temp: 98.8 F (37.1 C)  Resp: 18     Body mass index is 19.44 kg/(m^2).    ECOG FS:1 - Symptomatic but completely ambulatory  Sclerae unicteric, pupils round and equal, EOMs intact Oropharynx clear and moist-- no thrush or other lesions No cervical or supraclavicular adenopathy Lungs no rales or rhonchi Heart regular rate and rhythm Abd soft, nontender, positive bowel sounds MSK no focal spinal tenderness, no upper extremity lymphedema Neuro: Slight tremor left  hand, well oriented, appropriate affect Breasts: Deferred    LAB RESULTS:  CMP     Component Value Date/Time   NA 143 12/14/2014 0916   NA 141 04/21/2014 0910   K 4.2 12/14/2014 0916   K 3.6 04/21/2014 0910   CL 103 04/21/2014 0910   CL 102 01/16/2012 0804   CO2 28 12/14/2014 0916   CO2 27 04/21/2014 0910   GLUCOSE 91 12/14/2014 0916   GLUCOSE 133* 04/21/2014 0910   GLUCOSE 92 01/16/2012 0804   BUN 8.0 12/14/2014 0916   BUN 9 04/21/2014 0910   CREATININE 0.9 12/14/2014 0916   CREATININE 0.77 04/21/2014 0910   CALCIUM 9.7 12/14/2014 0916   CALCIUM 9.5 04/21/2014 0910   PROT 6.9 12/14/2014 0916   PROT 6.3 04/21/2014 0910   ALBUMIN  3.7 12/14/2014 0916   ALBUMIN 3.1* 04/21/2014 0910   AST 17 12/14/2014 0916   AST 26 04/21/2014 0910   ALT 13 12/14/2014 0916   ALT 19 04/21/2014 0910   ALKPHOS 83 12/14/2014 0916   ALKPHOS 99 04/21/2014 0910   BILITOT 0.34 12/14/2014 0916   BILITOT 0.5 04/21/2014 0910   GFRNONAA >90 04/21/2014 0910   GFRAA >90 04/21/2014 0910    I No results found for: SPEP  Lab Results  Component Value Date   WBC 2.3* 12/21/2014   NEUTROABS 1.2* 12/21/2014   HGB 10.3* 12/21/2014   HCT 30.5* 12/21/2014   MCV 97.8 12/21/2014   PLT 144* 12/21/2014      Chemistry      Component Value Date/Time   NA 143 12/14/2014 0916   NA 141 04/21/2014 0910   K 4.2 12/14/2014 0916   K 3.6 04/21/2014 0910   CL 103 04/21/2014 0910   CL 102 01/16/2012 0804   CO2 28 12/14/2014 0916   CO2 27 04/21/2014 0910   BUN 8.0 12/14/2014 0916   BUN 9 04/21/2014 0910   CREATININE 0.9 12/14/2014 0916   CREATININE 0.77 04/21/2014 0910      Component Value Date/Time   CALCIUM 9.7 12/14/2014 0916   CALCIUM 9.5 04/21/2014 0910   ALKPHOS 83 12/14/2014 0916   ALKPHOS 99 04/21/2014 0910   AST 17 12/14/2014 0916   AST 26 04/21/2014 0910   ALT 13 12/14/2014 0916   ALT 19 04/21/2014 0910   BILITOT 0.34 12/14/2014 0916   BILITOT 0.5 04/21/2014 0910       Lab Results    Component Value Date   LABCA2 24 05/31/2011    No components found for: [id number]  No results for input(s): INR in the last 168 hours.  Urinalysis No results found for: COLORURINE  STUDIES: Ct Chest W Contrast  12/16/2014  CLINICAL DATA:  Metastatic right breast cancer diagnosed in 2013 status post right mastectomy, with bone and brain metastases detected in April 2016, status post brain and spine radiation therapy, with ongoing chemotherapy. EXAM: CT CHEST, ABDOMEN, AND PELVIS WITH CONTRAST TECHNIQUE: Multidetector CT imaging of the chest, abdomen and pelvis was performed following the standard protocol during bolus administration of intravenous contrast. CONTRAST:  175mL OMNIPAQUE IOHEXOL 300 MG/ML  SOLN COMPARISON:  04/15/2014 chest CT. 06/08/2011 CT of the chest, abdomen and pelvis and PET CT. FINDINGS: CT CHEST FINDINGS Mediastinum/Nodes: Normal heart size. No pericardial fluid/thickening. Great vessels are normal in course and caliber. No central pulmonary emboli. Normal visualized thyroid. Normal esophagus. Surgical clips are noted throughout the right axilla. Patient is status post right mastectomy, with no recurrent mass in the right chest wall. No pathologically enlarged axillary, mediastinal or hilar lymph nodes. Lungs/Pleura: No pneumothorax. No pleural effusion. Subpleural reticulation in the anterior and apical right upper lobe is stable and likely to represent postradiation change. Stable 3 mm solid right upper lobe pulmonary nodule (series 5/ image 22). Stable 3 mm posterior right lower lobe solid pulmonary nodule (5/27). Separate stable 2 mm and 3 mm right lower lobe pulmonary nodules (5/38 and 5/41). There are 4 stable left lower lobe pulmonary nodules, largest 5 mm (5/44). No acute consolidative airspace disease, new significant pulmonary nodules or lung masses. Musculoskeletal: Stable severe T8 vertebral pathologic compression fracture status post bilateral posterior thoracic spine  fusion from T6-T10, with no thoracic spine subluxation. Stable T7, T9 and T12 lytic vertebral metastases. No new focal bone lesions in the thorax. CT ABDOMEN  PELVIS FINDINGS Hepatobiliary: There are at least 10 scattered subcentimeter hypodense liver lesions throughout the liver, largest 0.8 cm in segment 8 of the right liver lobe, not appreciably changed since 04/15/2014. Status post cholecystectomy. Bile ducts are stable and within expected post cholecystectomy limits, with common bile duct diameter of 8 mm. Pancreas: Normal, with no mass or duct dilation. Spleen: Normal size. No mass. Adrenals/Urinary Tract: Normal adrenals. Normal kidneys with no hydronephrosis and no renal mass. Normal bladder. Stomach/Bowel: Grossly normal stomach. Normal caliber small bowel with no small bowel wall thickening. Normal appendix. Normal large bowel with no diverticulosis, large bowel wall thickening or pericolonic fat stranding. Vascular/Lymphatic: Normal caliber abdominal aorta. Patent portal, splenic, hepatic and renal veins. No pathologically enlarged lymph nodes in the abdomen or pelvis. Reproductive: Status post hysterectomy, with no abnormal findings at the vaginal cuff. No adnexal mass. Other: No pneumoperitoneum, ascites or focal fluid collection. Musculoskeletal: Mixed lytic and sclerotic osseous metastases throughout age of the lumbar vertebral bodies and L5 spinous process are not appreciably changed from 04/12/2014 lumbar spine MRI. No new focal bone lesions in the abdomen or pelvis. IMPRESSION: 1. Interval stability of thoracolumbar spine osseous metastases since April 2016 imaging. No new bone metastases. 2. Interval stability of subcentimeter pulmonary nodules in both lungs since 04/15/2014 chest CT. No new pulmonary nodules. 3. Interval stability of subcentimeter hypodense liver lesions, too small to characterize (and possibly benign liver cysts), with no new liver lesions. 4. No lymphadenopathy or new sites of  metastatic disease in the chest, abdomen or pelvis. No acute abnormality. Electronically Signed   By: Ilona Sorrel M.D.   On: 12/16/2014 10:01   Nm Bone Scan Whole Body  12/16/2014  CLINICAL DATA:  Breast cancer. EXAM: NUCLEAR MEDICINE WHOLE BODY BONE SCAN TECHNIQUE: Whole body anterior and posterior images were obtained approximately 3 hours after intravenous injection of radiopharmaceutical. RADIOPHARMACEUTICALS:  25.8 mCi Technetium-4m MDP IV COMPARISON:  None. FINDINGS: Bilateral renal function and excretion. Right T12 punctate area of increased activity noted. This could be benign secondary to a process such as degenerative change, however metastatic focus cannot be excluded. Thoracic spine MRI with gadolinium enhancement can be obtained for further evaluation. Focal area of increased activity noted over the left mandible. This may be related to dental disease. Mandibular series suggested for further evaluation. No other bony abnormalities identified. IMPRESSION: 1. Punctate area of increased activity over the right portion of the T12 vertebral body. This could be benign secondary to a benign process such as degenerative disease, however metastatic focus cannot be excluded . Thoracic spine MRI with gadolinium enhancement can be obtained for further evaluation. 2. Focal area of increased activity noted over the left mandible. This may be related to dental disease. Mandibular series can be obtained for further evaluation . Electronically Signed   By: Marcello Moores  Register   On: 12/16/2014 11:22   Ct Abdomen Pelvis W Contrast  12/16/2014  CLINICAL DATA:  Metastatic right breast cancer diagnosed in 2013 status post right mastectomy, with bone and brain metastases detected in April 2016, status post brain and spine radiation therapy, with ongoing chemotherapy. EXAM: CT CHEST, ABDOMEN, AND PELVIS WITH CONTRAST TECHNIQUE: Multidetector CT imaging of the chest, abdomen and pelvis was performed following the standard  protocol during bolus administration of intravenous contrast. CONTRAST:  147mL OMNIPAQUE IOHEXOL 300 MG/ML  SOLN COMPARISON:  04/15/2014 chest CT. 06/08/2011 CT of the chest, abdomen and pelvis and PET CT. FINDINGS: CT CHEST FINDINGS Mediastinum/Nodes: Normal heart size. No  pericardial fluid/thickening. Great vessels are normal in course and caliber. No central pulmonary emboli. Normal visualized thyroid. Normal esophagus. Surgical clips are noted throughout the right axilla. Patient is status post right mastectomy, with no recurrent mass in the right chest wall. No pathologically enlarged axillary, mediastinal or hilar lymph nodes. Lungs/Pleura: No pneumothorax. No pleural effusion. Subpleural reticulation in the anterior and apical right upper lobe is stable and likely to represent postradiation change. Stable 3 mm solid right upper lobe pulmonary nodule (series 5/ image 22). Stable 3 mm posterior right lower lobe solid pulmonary nodule (5/27). Separate stable 2 mm and 3 mm right lower lobe pulmonary nodules (5/38 and 5/41). There are 4 stable left lower lobe pulmonary nodules, largest 5 mm (5/44). No acute consolidative airspace disease, new significant pulmonary nodules or lung masses. Musculoskeletal: Stable severe T8 vertebral pathologic compression fracture status post bilateral posterior thoracic spine fusion from T6-T10, with no thoracic spine subluxation. Stable T7, T9 and T12 lytic vertebral metastases. No new focal bone lesions in the thorax. CT ABDOMEN PELVIS FINDINGS Hepatobiliary: There are at least 10 scattered subcentimeter hypodense liver lesions throughout the liver, largest 0.8 cm in segment 8 of the right liver lobe, not appreciably changed since 04/15/2014. Status post cholecystectomy. Bile ducts are stable and within expected post cholecystectomy limits, with common bile duct diameter of 8 mm. Pancreas: Normal, with no mass or duct dilation. Spleen: Normal size. No mass. Adrenals/Urinary  Tract: Normal adrenals. Normal kidneys with no hydronephrosis and no renal mass. Normal bladder. Stomach/Bowel: Grossly normal stomach. Normal caliber small bowel with no small bowel wall thickening. Normal appendix. Normal large bowel with no diverticulosis, large bowel wall thickening or pericolonic fat stranding. Vascular/Lymphatic: Normal caliber abdominal aorta. Patent portal, splenic, hepatic and renal veins. No pathologically enlarged lymph nodes in the abdomen or pelvis. Reproductive: Status post hysterectomy, with no abnormal findings at the vaginal cuff. No adnexal mass. Other: No pneumoperitoneum, ascites or focal fluid collection. Musculoskeletal: Mixed lytic and sclerotic osseous metastases throughout age of the lumbar vertebral bodies and L5 spinous process are not appreciably changed from 04/12/2014 lumbar spine MRI. No new focal bone lesions in the abdomen or pelvis. IMPRESSION: 1. Interval stability of thoracolumbar spine osseous metastases since April 2016 imaging. No new bone metastases. 2. Interval stability of subcentimeter pulmonary nodules in both lungs since 04/15/2014 chest CT. No new pulmonary nodules. 3. Interval stability of subcentimeter hypodense liver lesions, too small to characterize (and possibly benign liver cysts), with no new liver lesions. 4. No lymphadenopathy or new sites of metastatic disease in the chest, abdomen or pelvis. No acute abnormality. Electronically Signed   By: Ilona Sorrel M.D.   On: 12/16/2014 10:01    ASSESSMENT: 44 y.o. BRCA negative [place] woman status post right breast upper outer quadrant and right axillary lymph node biopsy 05/18/2011, both positive for an invasive ductal carcinoma, high-grade, clinicallyT2 N1-2 or stage IIB/IIIA,  estrogen receptor 100% positive, progesterone receptor 87% positive, with an MIB-1 of 14% and no HER-2 amplification ([id number]).  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   (a) VUS were also found in [place] and BARD1  (2) additional right breast biopsy upper inner quadrant 06/08/2011 showed only a fibroadenoma, and central left breast biopsy for another suspicious lesion showed only fibrocystic changes ([id number] and [id number]).   (3)Treated neoadjuvantly with cyclophosphamide and docetaxel x4 completed 08/29/2011.  (4) status post right modified radical mastectomy 10/03/2011 showing  a residual pT1c pN1a (3/18 lymph nodes positive) invasive ductal carcinoma, grade 1,estrogen receptor 100% positive, progesterone receptor negative, with no HER-2 amplification ([id number])  (5) adjuvant radiation to the right chest wall, right supraclavicular fossa and right scar completed 12/26/2011  (6) tamoxifen started January 2014-- discontinued April 2016 with evidence of metastatic disease  (7) status post total hysterectomy with bilateral salpingo-oophorectomy 12/23/2012 with benign pathology ([id number])  METASTATIC DISEASE 04/13/2014 (8) presented with T8 cord compression and underwent laminectomy 04/13/2014 with T8 decompression of spinal cord, posterior fixation from T6-T10 with pedicle screws and rods, posterior arthrodesis with allograft. 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 ExacTrac Snap verification was performed for each couch angle.   (b) radiation to the T-spine completed 05/08/2014.  (c) "Auto-LITT" procedure performed at [facility] on 11/20/14  (11) started  fulvestrant 05/18/2014 and Palbociclib 06/01/2014  (a) Palbociclib dose decreased to 100 mg daily, 21/7 as of 08/05/2014  (12) started zolendronate 05/18/2014, repeated every 12 weeks  PLAN: Kenidy is doing remarkably well despite her diagnosis of stage IV breast cancer. We reviewed her films in detail today. As far as her systemic disease, outside the brain, is concerned, we reviewed the recent restaging films and there is no evidence of activity. Accordingly we are continuing the fulvestrant monthly, the Palbociclib daily 21 days on and 7 days off, and the zolendronate every 12 weeks.  The brain involvement of course is of more concern. This is currently followed closely by Dr. Salomon Fick at [facility] where she already has an appointment early next year.  We are going to see Larya on a monthly basis, the same day as her treatments, to manage symptoms and review labs. She knows to call for any problems that may develop before her next visit here. Chauncey Cruel, MD   12/21/2014 3:52 PM

## 2014-12-22 ENCOUNTER — Encounter: Payer: Self-pay | Admitting: Physical Therapy

## 2014-12-22 ENCOUNTER — Ambulatory Visit: Payer: BC Managed Care – PPO | Admitting: Physical Therapy

## 2014-12-22 DIAGNOSIS — R29898 Other symptoms and signs involving the musculoskeletal system: Secondary | ICD-10-CM

## 2014-12-22 DIAGNOSIS — R6889 Other general symptoms and signs: Secondary | ICD-10-CM

## 2014-12-22 DIAGNOSIS — R531 Weakness: Secondary | ICD-10-CM | POA: Diagnosis not present

## 2014-12-22 DIAGNOSIS — R262 Difficulty in walking, not elsewhere classified: Secondary | ICD-10-CM

## 2014-12-22 DIAGNOSIS — R269 Unspecified abnormalities of gait and mobility: Secondary | ICD-10-CM

## 2014-12-22 NOTE — Therapy (Signed)
[facility] [address] [place], [place], [address] Phone: [phone]   Fax:  [phone]  Physical Therapy Treatment  Patient Details  Name: Carolyn [name] MRN: [id number] Date of Birth: [birth year] Referring Provider: Roney Mans, FNP  Encounter Date: 12/22/2014      PT End of Session - 12/22/14 0857    Visit Number 5   Date for PT Re-Evaluation 01/25/15   PT Start Time 0848   PT Stop Time 0930   PT Time Calculation (min) 42 min   Activity Tolerance Patient tolerated treatment well   Behavior During Therapy [facility] for tasks assessed/performed      Past Medical History  Diagnosis Date  . 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 Hx of radiation therapy 11/08/11 -12/26/11    right chest wall/supraclav fossa, right scar  . History of chemotherapy 06/27/11 -09/04/11    neoadjuvant  . S/P radiation therapy 05/08/14    SRS brain    Past Surgical History  Procedure Laterality Date  . Wisdom tooth extraction    . Portacath placement  06/20/2011    Procedure: INSERTION PORT-A-CATH;  Surgeon: Haywood Lasso, MD;  Location: [place];  Service: General;  Laterality: N/A;  . Modified mastectomy  10/03/2011    Procedure: MODIFIED MASTECTOMY;  Surgeon: Haywood Lasso, MD;  Location: [place];  Service: General;  Laterality: Right;  . Port-a-cath removal  01/30/2012    Procedure: MINOR REMOVAL PORT-A-CATH;  Surgeon: Haywood Lasso, MD;  Location: [place];  Service: General;  Laterality: Left;  . Breast biopsy      Left  . Robotic assisted total hysterectomy with bilateral salpingo oopherectomy Bilateral 12/23/2012    Procedure: ROBOTIC ASSISTED TOTAL HYSTERECTOMY WITH BILATERAL SALPINGO OOPHORECTOMY;  Surgeon: Marvene Staff, MD;  Location: [facility] [facility];  Service: Gynecology;  Laterality: Bilateral;  . Cholecystectomy N/A  03/01/2014    Procedure: LAPAROSCOPIC CHOLECYSTECTOMY;  Surgeon: Leighton Ruff, MD;  [facility];  Service: General;  Laterality: N/A;  . Laminectomy N/A 04/13/2014    Procedure: THORACIC LAMINECTOMY WITH FIXATION THORACIC SIX-THORACIC TEN FUSION;  Surgeon: Kristeen Miss, MD;  Location: [place] [facility];  Service: Neurosurgery;  Laterality: N/A;  . Brain surgery-litt Left 11/20/14    LITT procedure for Lt brain met.    There were no vitals filed for this visit.  Visit Diagnosis:  Weakness generalized  Activity intolerance  Abnormality of gait  Weakness of right leg  Difficulty walking      Subjective Assessment - 12/22/14 0852    Subjective This morning pt was able to walk for 15 from her home to PT clinic.   Currently in Pain? No/denies                         [facility] Adult PT Treatment/Exercise - 12/22/14 0001    Ambulation/Gait   Ambulation/Gait Yes   Ambulation/Gait Assistance 6: Modified independent (Device/Increase time)   Ambulation Distance (Feet) 120 Feet  x2   Assistive device Straight cane   Gait Pattern Step-through pattern;Decreased stance time - right;Decreased dorsiflexion - right   Ambulation Surface Level   Stairs Yes   Stairs Assistance 5: Supervision   Stair Management Technique Step to pattern;Two rails   Number of Stairs 16   Gait Comments 1st round 33sec, 2nd 27, 3rd 38 with 2 # weight, pt  improved from 45mn and 8sec last week   Exercises   Exercises Lumbar   Lumbar Exercises: Aerobic   Stationary Bike Nu-step L1 x 816m  seat and arms #9   Lumbar Exercises: Standing   Other Standing Lumbar Exercises Sidestepping along kitchencounter 72m672mwith B UE support   Knee/Hip Exercises: Standing   Lateral Step Up Both;2 sets;10 reps;Hand Hold: 2;Step Height: 6"   Forward Step Up Both;3 sets;Hand Hold: 2;Step Height: 6"  no shaky legs today, and good pace   Other Standing Knee Exercises Sit to stand  5x x2   in 12 sec with use of B UE                   PT Short Term Goals - 12/22/14 [id number] PT SHORT TERM GOAL #1   Title be independent in initial HEP   Time 4   Period Weeks   Status Achieved   PT SHORT TERM GOAL #2   Title improve strength and safety to allow to wean from walker to cane 50% of the time in the home   Time 4   Period Weeks   Status Achieved   PT SHORT TERM GOAL #3   Title demonstrate heel strike on the Rt > 50% of the time   Time 4   Period Weeks   Status Partially Met   PT SHORT TERM GOAL #4   Title stand and walk for 10-15 minutes without need to rest   Time 4   Period Weeks   Status Achieved           PT Long Term Goals - 12/22/14 0900    PT LONG TERM GOAL #1   Title be independent in advanced HEP   Time 8   Period Weeks   Status On-going   PT LONG TERM GOAL #2   Title wean from walker to use of cane at least 50% in the community   Time 8   Period Weeks   Status On-going   PT LONG TERM GOAL #3   Title improve Rt LE strength to 4+/5 throughout to improve endurance and allow for improved stafety with negotiating steps   Time 8   Period Weeks   Status On-going   PT LONG TERM GOAL #4   Title stand and walk for 20-30 minutes without rest to improve endurance for return to work   Time 8   Period Weeks   Status On-going   PT LONG TERM GOAL #5   Title ascend steps with step-over-step gait with use of 1 rail   Period Weeks   Status On-going   PT [place]   Title --               Plan - 12/22/14 0857    Clinical Impression Statement Pt presents with improved endurance with activities in PT    Pt will benefit from skilled therapeutic intervention in order to improve on the following deficits Abnormal gait;Difficulty walking;Decreased endurance;Decreased activity tolerance;Decreased strength;Decreased balance   Rehab Potential Good   PT Frequency 2x / week   PT Duration 8 weeks   PT Treatment/Interventions ADLs/Self Care Home Management;Therapeutic  activities;Patient/family education;Therapeutic exercise;Manual techniques;Gait training;Neuromuscular re-education;Functional mobility training;Energy conservation;Stair training   PT Next Visit Plan Rt LE strength, endurance, gait, steps   Consulted and Agree with Plan of Care Patient        Problem List Patient Active Problem List   Diagnosis Date  Noted  . Genetic testing 10/15/2014  . Oral thrush 06/23/2014  . Fever 06/17/2014  . Hypokalemia 06/17/2014  . Breast cancer metastasized to brain ([place])   . 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 Malnutrition of moderate degree ([place]) 04/13/2014  . Myelopathy ([place]) 04/12/2014  . Pathologic fracture of thoracic vertebrae 04/12/2014  . Biliary colic 38/93/7342  . Breast cancer of upper-outer quadrant of right female breast ([place]) 09/24/2013  . S/P total hysterectomy and bilateral salpingo-oophorectomy 12/23/2012  . History of chemotherapy   . Hx of radiation therapy     NAUMANN-HOUEGNIFIO,Jakoby Melendrez PTA 12/22/2014, 9:38 AM  [facility] [address] [place], [place], [address] Phone: [phone]   Fax:  [phone]  Name: EVGENIA MERRIMAN MRN: [id number] Date of Birth: [birth year]

## 2014-12-23 ENCOUNTER — Other Ambulatory Visit: Payer: Self-pay | Admitting: Oncology

## 2014-12-23 ENCOUNTER — Ambulatory Visit: Payer: BC Managed Care – PPO

## 2014-12-23 DIAGNOSIS — R269 Unspecified abnormalities of gait and mobility: Secondary | ICD-10-CM

## 2014-12-23 DIAGNOSIS — R531 Weakness: Secondary | ICD-10-CM

## 2014-12-23 DIAGNOSIS — R6889 Other general symptoms and signs: Secondary | ICD-10-CM

## 2014-12-23 DIAGNOSIS — R29898 Other symptoms and signs involving the musculoskeletal system: Secondary | ICD-10-CM

## 2014-12-23 NOTE — Therapy (Signed)
[facility] [address], [place], [place], [address] Phone: [phone]   Fax:  [phone]  Physical Therapy Treatment  Patient Details  Name: Carolyn [name] MRN: [id number] Date of Birth: January 19, 1970 Referring Provider: Roney Mans, FNP  Encounter Date: 12/23/2014      PT End of Session - 12/23/14 1650    Visit Number 6   Date for PT Re-Evaluation 01/25/15   PT Start Time 1612   PT Stop Time 1652   PT Time Calculation (min) 40 min   Activity Tolerance Patient tolerated treatment well   Behavior During Therapy [facility] for tasks assessed/performed      Past Medical History  Diagnosis Date  . 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 Hx of radiation therapy 11/08/11 -12/26/11    right chest wall/supraclav fossa, right scar  . History of chemotherapy 06/27/11 -09/04/11    neoadjuvant  . S/P radiation therapy 05/08/14    SRS brain    Past Surgical History  Procedure Laterality Date  . Wisdom tooth extraction    . Portacath placement  06/20/2011    Procedure: INSERTION PORT-A-CATH;  Surgeon: Haywood Lasso, MD;  Location: [place];  Service: General;  Laterality: N/A;  . Modified mastectomy  10/03/2011    Procedure: MODIFIED MASTECTOMY;  Surgeon: Haywood Lasso, MD;  Location: [place];  Service: General;  Laterality: Right;  . Port-a-cath removal  01/30/2012    Procedure: MINOR REMOVAL PORT-A-CATH;  Surgeon: Haywood Lasso, MD;  Location: [place];  Service: General;  Laterality: Left;  . Breast biopsy      Left  . Robotic assisted total hysterectomy with bilateral salpingo oopherectomy Bilateral 12/23/2012    Procedure: ROBOTIC ASSISTED TOTAL HYSTERECTOMY WITH BILATERAL SALPINGO OOPHORECTOMY;  Surgeon: Marvene Staff, MD;  Location: [place] [facility];  Service: Gynecology;  Laterality: Bilateral;  . Cholecystectomy N/A  03/01/2014    Procedure: LAPAROSCOPIC CHOLECYSTECTOMY;  Surgeon: Leighton Ruff, MD;  [facility];  Service: General;  Laterality: N/A;  . Laminectomy N/A 04/13/2014    Procedure: THORACIC LAMINECTOMY WITH FIXATION THORACIC SIX-THORACIC TEN FUSION;  Surgeon: Kristeen Miss, MD;  Location: [place] [facility];  Service: Neurosurgery;  Laterality: N/A;  . Brain surgery-litt Left 11/20/14    LITT procedure for Lt brain met.    There were no vitals filed for this visit.  Visit Diagnosis:  Weakness generalized  Activity intolerance  Abnormality of gait  Weakness of right leg      Subjective Assessment - 12/23/14 1618    Subjective Pt is back to work and doing well.  Pt is using cane for all distances.     Currently in Pain? No/denies            [facility] PT Assessment - 12/23/14 0001    Precautions   Precautions Fall;Other (comment)  no [place], no arm bike (lymph node removal)                     [facility] Adult PT Treatment/Exercise - 12/23/14 0001    Exercises   Exercises Lumbar;Knee/Hip   Lumbar Exercises: Aerobic   Stationary Bike Nu-step L1 x 17mn  seat and arms #9   Knee/Hip Exercises: Machines for Strengthening   Total Gym Leg Press 50# bil. 3x10, single leg 30# 3x10  seat 8   Knee/Hip Exercises: Standing   Heel Raises Both;2 sets;20  reps   Lateral Step Up --   Forward Step Up Both;3 sets;Hand Hold: 2;Step Height: 6"  no shaky legs today, and good pace   Rebounder 3 ways x 1 min each   Other Standing Knee Exercises Sit to stand  3 x10  use of hands   Knee/Hip Exercises: Seated   Long Arc Quad Strengthening;Right;10 reps;2 sets;Weights   Long Arc Quad Weight 2 lbs.                  PT Short Term Goals - 12/22/14 0858    PT SHORT TERM GOAL #1   Title be independent in initial HEP   Time 4   Period Weeks   Status Achieved   PT SHORT TERM GOAL #2   Title improve strength and safety to allow to wean from walker to cane 50% of the time in the home    Time 4   Period Weeks   Status Achieved   PT SHORT TERM GOAL #3   Title demonstrate heel strike on the Rt > 50% of the time   Time 4   Period Weeks   Status Partially Met   PT SHORT TERM GOAL #4   Title stand and walk for 10-15 minutes without need to rest   Time 4   Period Weeks   Status Achieved           PT Long Term Goals - 12/22/14 0900    PT LONG TERM GOAL #1   Title be independent in advanced HEP   Time 8   Period Weeks   Status On-going   PT LONG TERM GOAL #2   Title wean from walker to use of cane at least 50% in the community   Time 8   Period Weeks   Status On-going   PT LONG TERM GOAL #3   Title improve Rt LE strength to 4+/5 throughout to improve endurance and allow for improved stafety with negotiating steps   Time 8   Period Weeks   Status On-going   PT LONG TERM GOAL #4   Title stand and walk for 20-30 minutes without rest to improve endurance for return to work   Time 8   Period Weeks   Status On-going   PT LONG TERM GOAL #5   Title ascend steps with step-over-step gait with use of 1 rail   Period Weeks   Status On-going   PT LONG TERM GOAL #6   Title --               Plan - 12/23/14 1626    Clinical Impression Statement Pt is able to walk for 10 minutes without rest.  Gait is improved with heel strike more consistently bilaterally.  Pt has weaned from the walker for all distances.  Pt has returned to work and is able to work a full day without significant fatigue.  Pt with continued balance, strength and endurance deficits.  Pt will beneift from skilled PT for strength, endurance and gait training.     Pt will benefit from skilled therapeutic intervention in order to improve on the following deficits Abnormal gait;Difficulty walking;Decreased endurance;Decreased activity tolerance;Decreased strength;Decreased balance   Rehab Potential Good   PT Frequency 2x / week   PT Duration 8 weeks   PT Treatment/Interventions ADLs/Self Care Home  Management;Therapeutic activities;Patient/family education;Therapeutic exercise;Manual techniques;Gait training;Neuromuscular re-education;Functional mobility training;Energy conservation;Stair training   PT Next Visit Plan Rt LE strength, endurance, gait, steps. NO UBE (lymph  node removal)   Consulted and Agree with Plan of Care Patient        Problem List Patient Active Problem List   Diagnosis Date Noted  . Genetic testing 10/15/2014  . Oral thrush 06/23/2014  . Fever 06/17/2014  . Hypokalemia 06/17/2014  . Breast cancer metastasized to brain ([place])   . 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 Malnutrition of moderate degree ([place]) 04/13/2014  . Myelopathy ([place]) 04/12/2014  . Pathologic fracture of thoracic vertebrae 04/12/2014  . Biliary colic 68/86/4847  . Breast cancer of upper-outer quadrant of right female breast ([place]) 09/24/2013  . S/P total hysterectomy and bilateral salpingo-oophorectomy 12/23/2012  . History of chemotherapy   . Hx of radiation therapy     TAKACS,KELLY, PT 12/23/2014, 4:54 PM  [facility] [address], [place], [place], [address] Phone: [phone]   Fax:  [phone]  Name: LODEMA PARMA MRN: [id number] Date of Birth: April 27, 1970

## 2014-12-25 ENCOUNTER — Telehealth: Payer: Self-pay | Admitting: *Deleted

## 2014-12-25 NOTE — Telephone Encounter (Signed)
Pt called stating that she has a cold with clear colored drainage from the nose.  Pt wanted to know if she could take Mucinex.  Informed pt that it is ok for her to take Mucinex; however, if drainage color is yellow or [place], pt needs to either call our office or her PCP for further instructions.  Pt voiced understanding.

## 2014-12-28 ENCOUNTER — Ambulatory Visit: Payer: BC Managed Care – PPO | Admitting: Physical Therapy

## 2014-12-30 ENCOUNTER — Ambulatory Visit: Payer: BC Managed Care – PPO

## 2014-12-30 DIAGNOSIS — R29898 Other symptoms and signs involving the musculoskeletal system: Secondary | ICD-10-CM

## 2014-12-30 DIAGNOSIS — R269 Unspecified abnormalities of gait and mobility: Secondary | ICD-10-CM

## 2014-12-30 DIAGNOSIS — R6889 Other general symptoms and signs: Secondary | ICD-10-CM

## 2014-12-30 DIAGNOSIS — R531 Weakness: Secondary | ICD-10-CM

## 2014-12-30 NOTE — Patient Instructions (Signed)
Knee High   Holding stable object, raise knee to hip level, then lower knee. Repeat with other knee. Complete __10_ repetitions. Do __2__ sessions per day.  ABDUCTION: Standing (Active)   Stand, feet flat. Lift right leg out to side. Use _0__ lbs. Complete __10_ repetitions. Perform __2_ sessions per day.    EXTENSION: Standing (Active)  Stand, both feet flat. Draw right leg behind body as far as possible. Use 0___ lbs. Complete 10 repetitions. Perform __2_ sessions per day.  Copyright  VHI. All rights reserved.   [facility] [address], [address] [place], [place] [address] Phone # [phone] Fax [phone] 

## 2014-12-30 NOTE — Therapy (Signed)
[facility] [address], [place], [place], [address] Phone: [phone]   Fax:  [phone]  Physical Therapy Treatment  Patient Details  Name: Carolyn [name] MRN: [id number] Date of Birth: Jul 03, 1970 Referring Provider: Roney Mans, FNP  Encounter Date: 12/30/2014      PT End of Session - 12/30/14 1604    Visit Number 7   Date for PT Re-Evaluation 01/25/15   PT Start Time 1525   PT Stop Time 1606   PT Time Calculation (min) 41 min   Activity Tolerance Patient tolerated treatment well   Behavior During Therapy [facility] for tasks assessed/performed      Past Medical History  Diagnosis Date  . 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 Hx of radiation therapy 11/08/11 -12/26/11    right chest wall/supraclav fossa, right scar  . History of chemotherapy 06/27/11 -09/04/11    neoadjuvant  . S/P radiation therapy 05/08/14    SRS brain    Past Surgical History  Procedure Laterality Date  . Wisdom tooth extraction    . Portacath placement  06/20/2011    Procedure: INSERTION PORT-A-CATH;  Surgeon: Haywood Lasso, MD;  Location: [place];  Service: General;  Laterality: N/A;  . Modified mastectomy  10/03/2011    Procedure: MODIFIED MASTECTOMY;  Surgeon: Haywood Lasso, MD;  Location: [place];  Service: General;  Laterality: Right;  . Port-a-cath removal  01/30/2012    Procedure: MINOR REMOVAL PORT-A-CATH;  Surgeon: Haywood Lasso, MD;  Location: [place];  Service: General;  Laterality: Left;  . Breast biopsy      Left  . Robotic assisted total hysterectomy with bilateral salpingo oopherectomy Bilateral 12/23/2012    Procedure: ROBOTIC ASSISTED TOTAL HYSTERECTOMY WITH BILATERAL SALPINGO OOPHORECTOMY;  Surgeon: Marvene Staff, MD;  Location: [place] [facility];  Service: Gynecology;  Laterality: Bilateral;  . Cholecystectomy N/A  03/01/2014    Procedure: LAPAROSCOPIC CHOLECYSTECTOMY;  Surgeon: Leighton Ruff, MD;  [facility];  Service: General;  Laterality: N/A;  . Laminectomy N/A 04/13/2014    Procedure: THORACIC LAMINECTOMY WITH FIXATION THORACIC SIX-THORACIC TEN FUSION;  Surgeon: Kristeen Miss, MD;  Location: [place] [facility];  Service: Neurosurgery;  Laterality: N/A;  . Brain surgery-litt Left 11/20/14    LITT procedure for Lt brain met.    There were no vitals filed for this visit.  Visit Diagnosis:  Weakness generalized  Activity intolerance  Abnormality of gait  Weakness of right leg                       [facility] Adult PT Treatment/Exercise - 12/30/14 0001    Lumbar Exercises: Aerobic   Stationary Bike Nu-step L1 x 52mn  seat and arms #9   Knee/Hip Exercises: Machines for Strengthening   Total Gym Leg Press 55# bil. 3x10, single leg 35# 3x10  seat 8   Knee/Hip Exercises: Standing   Heel Raises Both;2 sets;20 reps   Hip Flexion Stengthening;Both;2 sets;10 reps;Knee bent   Hip Abduction Stengthening;Both;2 sets;10 reps   Hip Extension Stengthening;Both;2 sets;10 reps   Forward Step Up Both;3 sets;Hand Hold: 2;Step Height: 6"   Rebounder 3 ways x 1 min each   Knee/Hip Exercises: Seated   Long Arc Quad Strengthening;Right;10 reps;2 sets;Weights   Long Arc Quad Weight 3 lbs.  PT Education - 12/30/14 1555    Education provided Yes   Education Details standing hip abduction, extension and marching   Person(s) Educated Patient   Methods Explanation;Demonstration;Handout   Comprehension Verbalized understanding;Returned demonstration          PT Short Term Goals - 12/22/14 0858    PT SHORT TERM GOAL #1   Title be independent in initial HEP   Time 4   Period Weeks   Status Achieved   PT SHORT TERM GOAL #2   Title improve strength and safety to allow to wean from walker to cane 50% of the time in the home   Time 4   Period Weeks   Status Achieved   PT  SHORT TERM GOAL #3   Title demonstrate heel strike on the Rt > 50% of the time   Time 4   Period Weeks   Status Partially Met   PT SHORT TERM GOAL #4   Title stand and walk for 10-15 minutes without need to rest   Time 4   Period Weeks   Status Achieved           PT Long Term Goals - 12/30/14 1532    PT LONG TERM GOAL #1   Title be independent in advanced HEP   Time 8   Period Weeks   Status On-going   PT LONG TERM GOAL #2   Title wean from walker to use of cane at least 50% in the community   Status Achieved   PT [place] #4   Title stand and walk for 20-30 minutes without rest to improve endurance for return to work   Time 8   Period Weeks   Status On-going   PT LONG TERM GOAL #5   Title ascend steps with step-over-step gait with use of 1 rail   Status Achieved               Plan - 12/30/14 1536    Clinical Impression Statement Pt is able to walk for 15 minutes without rest.  Gait is improved with heel strike more consitently bilaterally.  Pt has weaned from the walker for all distances.  Pt has returned to work and is able to work full day without significant fatigue . Pt has improved strength to ascend steps with step-over-step gait. Pt tolerated increased weight with long arc quads and leg press today.   Pt will continue to benefit from skilled PT for endurance, strength and gait training.     Pt will benefit from skilled therapeutic intervention in order to improve on the following deficits Abnormal gait;Difficulty walking;Decreased endurance;Decreased activity tolerance;Decreased strength;Decreased balance   Rehab Potential Good   PT Frequency 2x / week   PT Duration 8 weeks   PT Treatment/Interventions ADLs/Self Care Home Management;Therapeutic activities;Patient/family education;Therapeutic exercise;Manual techniques;Gait training;Neuromuscular re-education;Functional mobility training;Energy conservation;Stair training   PT Next Visit Plan Rt LE  strength, endurance, gait, steps. NO UBE (lymph node removal)   Consulted and Agree with Plan of Care Patient        Problem List Patient Active Problem List   Diagnosis Date Noted  . Genetic testing 10/15/2014  . Oral thrush 06/23/2014  . Fever 06/17/2014  . Hypokalemia 06/17/2014  . Breast cancer metastasized to brain ([place])   . 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 Malnutrition of moderate degree ([place]) 04/13/2014  . Myelopathy ([place]) 04/12/2014  . Pathologic fracture of thoracic vertebrae 04/12/2014  . Biliary colic 01/77/9390  .  Breast cancer of upper-outer quadrant of right female breast ([place]) 09/24/2013  . S/P total hysterectomy and bilateral salpingo-oophorectomy 12/23/2012  . History of chemotherapy   . Hx of radiation therapy     TAKACS,KELLY, PT 12/30/2014, 4:06 PM  [facility] [address] [place], [place], [address] Phone: [phone]   Fax:  [phone]  Name: MIAISABELLA BACORN MRN: [id number] Date of Birth: [birth year]

## 2015-01-05 ENCOUNTER — Ambulatory Visit: Payer: BC Managed Care – PPO

## 2015-01-05 ENCOUNTER — Other Ambulatory Visit: Payer: Self-pay | Admitting: Radiation Therapy

## 2015-01-05 ENCOUNTER — Encounter: Payer: Self-pay | Admitting: Radiation Therapy

## 2015-01-05 DIAGNOSIS — C7949 Secondary malignant neoplasm of other parts of nervous system: Principal | ICD-10-CM

## 2015-01-05 DIAGNOSIS — R531 Weakness: Secondary | ICD-10-CM

## 2015-01-05 DIAGNOSIS — R29898 Other symptoms and signs involving the musculoskeletal system: Secondary | ICD-10-CM

## 2015-01-05 DIAGNOSIS — R269 Unspecified abnormalities of gait and mobility: Secondary | ICD-10-CM

## 2015-01-05 DIAGNOSIS — C7931 Secondary malignant neoplasm of brain: Secondary | ICD-10-CM

## 2015-01-05 DIAGNOSIS — R6889 Other general symptoms and signs: Secondary | ICD-10-CM

## 2015-01-05 NOTE — Progress Notes (Addendum)
1.  Do you need a wheel chair?    no  2. On oxygen?  no  3. Have you ever had any surgery in the body part being scanned?  Yes- brain 11/20/14 @ [facility] by Dr. Salomon Fick   4. Have you ever had any surgery on your brain or heart?                            Yes, brain on 11/20/14  5. Have you ever had surgery on your eyes or ears?                                      no  6. Do you have a pacemaker or defibrillator?   no  7. Do you have a Neurostimulator?  no  8. Claustrophobic?  no  9. Any risk for metal in eyes?  no  10. Injury by bullet, buckshot, or shrapnel?  no  11. Stent?     no                                                                                                                12. Hx of Cancer?  Breast cancer with mets to brain and spine                                                                                                            13. 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 Hx of Lupus, Rheumatoid Arthritis or Scleroderma?  no  15. IV Antibiotics or long term use of NSAIDS?  no  16. HX of Hypertension?  no  17. Diabetes?  no  18. Allergy to contrast?  no  19. Recent labs. To be drawn on 1/30 and in [place]

## 2015-01-05 NOTE — Therapy (Signed)
[facility] [address], [place], [place], [address] Phone: [phone]   Fax:  [phone]  Physical Therapy Treatment  Patient Details  Name: Carolyn [name] MRN: [id number] Date of Birth: 03-17-1970 Referring Provider: Roney Mans, FNP  Encounter Date: 01/05/2015      PT End of Session - 01/05/15 1141    Visit Number 8   Date for PT Re-Evaluation 01/25/15   PT Start Time 1059   PT Stop Time 1146   PT Time Calculation (min) 47 min   Activity Tolerance Patient tolerated treatment well   Behavior During Therapy [facility] for tasks assessed/performed      Past Medical History  Diagnosis Date  . 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 Hx of radiation therapy 11/08/11 -12/26/11    right chest wall/supraclav fossa, right scar  . History of chemotherapy 06/27/11 -09/04/11    neoadjuvant  . S/P radiation therapy 05/08/14    SRS brain    Past Surgical History  Procedure Laterality Date  . Wisdom tooth extraction    . Portacath placement  06/20/2011    Procedure: INSERTION PORT-A-CATH;  Surgeon: Haywood Lasso, MD;  Location: [place];  Service: General;  Laterality: N/A;  . Modified mastectomy  10/03/2011    Procedure: MODIFIED MASTECTOMY;  Surgeon: Haywood Lasso, MD;  Location: [place];  Service: General;  Laterality: Right;  . Port-a-cath removal  01/30/2012    Procedure: MINOR REMOVAL PORT-A-CATH;  Surgeon: Haywood Lasso, MD;  Location: [place];  Service: General;  Laterality: Left;  . Breast biopsy      Left  . Robotic assisted total hysterectomy with bilateral salpingo oopherectomy Bilateral 12/23/2012    Procedure: ROBOTIC ASSISTED TOTAL HYSTERECTOMY WITH BILATERAL SALPINGO OOPHORECTOMY;  Surgeon: Marvene Staff, MD;  Location: [facility];  Service: Gynecology;  Laterality: Bilateral;  . Cholecystectomy N/A  03/01/2014    Procedure: LAPAROSCOPIC CHOLECYSTECTOMY;  Surgeon: Leighton Ruff, MD;  Location: [facility];  Service: General;  Laterality: N/A;  . Laminectomy N/A 04/13/2014    Procedure: THORACIC LAMINECTOMY WITH FIXATION THORACIC SIX-THORACIC TEN FUSION;  Surgeon: Kristeen Miss, MD;  Location: [place] [facility];  Service: Neurosurgery;  Laterality: N/A;  . Brain surgery-litt Left 11/20/14    LITT procedure for Lt brain met.    There were no vitals filed for this visit.  Visit Diagnosis:  Weakness generalized  Activity intolerance  Abnormality of gait  Weakness of right leg                       [facility] Adult PT Treatment/Exercise - 01/05/15 0001    Lumbar Exercises: Aerobic   Stationary Bike Nu-step L1 x 42mn  seat and arms #9   Knee/Hip Exercises: Machines for Strengthening   Total Gym Leg Press 55# bil. 3x10, single leg 35# 3x10  seat 8   Knee/Hip Exercises: Standing   Heel Raises Both;2 sets;20 reps   Hip Flexion Stengthening;Both;2 sets;10 reps;Knee bent   Hip Abduction Stengthening;Both;2 sets;10 reps   Hip Extension Stengthening;Both;2 sets;10 reps   Forward Step Up Both;3 sets;Hand Hold: 2;Step Height: 6"   Rebounder 3 ways x 1 min each   Knee/Hip Exercises: Seated   Long Arc Quad Strengthening;Right;10 reps;2 sets;Weights   Long Arc Quad Weight 3 lbs.  PT Short Term Goals - 12/22/14 0858    PT SHORT TERM GOAL #1   Title be independent in initial HEP   Time 4   Period Weeks   Status Achieved   PT SHORT TERM GOAL #2   Title improve strength and safety to allow to wean from walker to cane 50% of the time in the home   Time 4   Period Weeks   Status Achieved   PT SHORT TERM GOAL #3   Title demonstrate heel strike on the Rt > 50% of the time   Time 4   Period Weeks   Status Partially Met   PT SHORT TERM GOAL #4   Title stand and walk for 10-15 minutes without need to rest   Time 4   Period Weeks   Status Achieved            PT Long Term Goals - 01/05/15 1105    PT LONG TERM GOAL #1   Title be independent in advanced HEP   Time 8   Period Weeks   PT LONG TERM GOAL #2   Title wean from walker to use of cane at least 50% in the community   Status Achieved   PT LONG TERM GOAL #4   Title stand and walk for 20-30 minutes without rest to improve endurance for return to work   Time 8   Period Weeks   Status On-going  18 minutes   PT LONG TERM GOAL #5   Title ascend steps with step-over-step gait with use of 1 rail               Plan - 01/05/15 1106    Clinical Impression Statement Pt able to walk 18 minutes without rest.  Gait is improved with heel strike more consitently bilaterally.  Pt is using cane for community distances and only 10% in the home.  Pt has returned to work and is able to work full days without significant fatigue.  Pt tolerated increased weight with long arc quads and leg press over the past couple of visits.  Pt will continue to benefit from skilled PT for endruance, strength and gait training.     Pt will benefit from skilled therapeutic intervention in order to improve on the following deficits Abnormal gait;Difficulty walking;Decreased endurance;Decreased activity tolerance;Decreased strength;Decreased balance   Rehab Potential Good   PT Frequency 2x / week   PT Duration 8 weeks   PT Treatment/Interventions ADLs/Self Care Home Management;Therapeutic activities;Patient/family education;Therapeutic exercise;Manual techniques;Gait training;Neuromuscular re-education;Functional mobility training;Energy conservation;Stair training   PT Next Visit Plan Rt LE strength, endurance, gait, steps. NO UBE (lymph node removal)   Consulted and Agree with Plan of Care Patient        Problem List Patient Active Problem List   Diagnosis Date Noted  . Genetic testing 10/15/2014  . Oral thrush 06/23/2014  . Fever 06/17/2014  . Hypokalemia 06/17/2014  . Breast cancer metastasized to brain  ([place])   . 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 Malnutrition of moderate degree ([place]) 04/13/2014  . Myelopathy ([place]) 04/12/2014  . Pathologic fracture of thoracic vertebrae 04/12/2014  . Biliary colic 48/54/6270  . Breast cancer of upper-outer quadrant of right female breast ([place]) 09/24/2013  . S/P total hysterectomy and bilateral salpingo-oophorectomy 12/23/2012  . History of chemotherapy   . Hx of radiation therapy     Louna Rothgeb, PT 01/05/2015, 11:42 AM  [facility] [address] [place],  [place], [place], [address] Phone: [phone]   Fax:  [phone]  Name: Carolyn [name] MRN: [id number] Date of Birth: 1970-05-14

## 2015-01-07 ENCOUNTER — Encounter: Payer: Self-pay | Admitting: Physical Therapy

## 2015-01-07 ENCOUNTER — Ambulatory Visit: Payer: BC Managed Care – PPO | Admitting: Physical Therapy

## 2015-01-07 DIAGNOSIS — R269 Unspecified abnormalities of gait and mobility: Secondary | ICD-10-CM

## 2015-01-07 DIAGNOSIS — R262 Difficulty in walking, not elsewhere classified: Secondary | ICD-10-CM

## 2015-01-07 DIAGNOSIS — R531 Weakness: Secondary | ICD-10-CM | POA: Diagnosis not present

## 2015-01-07 DIAGNOSIS — R6889 Other general symptoms and signs: Secondary | ICD-10-CM

## 2015-01-07 NOTE — Therapy (Signed)
[facility] [address] [place], [place], [address] Phone: [phone]   Fax:  [phone]  Physical Therapy Treatment  Patient Details  Name: Carolyn [name] MRN: [id number] Date of Birth: 1970/10/05 Referring Provider: Roney Mans, FNP  Encounter Date: 01/07/2015      PT End of Session - 01/07/15 1124    Visit Number 9   Date for PT Re-Evaluation 01/25/15   PT Start Time 1105   PT Stop Time 1146   PT Time Calculation (min) 41 min   Activity Tolerance Patient tolerated treatment well   Behavior During Therapy [facility] for tasks assessed/performed      Past Medical History  Diagnosis Date  . 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 Hx of radiation therapy 11/08/11 -12/26/11    right chest wall/supraclav fossa, right scar  . History of chemotherapy 06/27/11 -09/04/11    neoadjuvant  . S/P radiation therapy 05/08/14    SRS brain    Past Surgical History  Procedure Laterality Date  . Wisdom tooth extraction    . Portacath placement  06/20/2011    Procedure: INSERTION PORT-A-CATH;  Surgeon: Haywood Lasso, MD;  Location: [place];  Service: General;  Laterality: N/A;  . Modified mastectomy  10/03/2011    Procedure: MODIFIED MASTECTOMY;  Surgeon: Haywood Lasso, MD;  Location: [place];  Service: General;  Laterality: Right;  . Port-a-cath removal  01/30/2012    Procedure: MINOR REMOVAL PORT-A-CATH;  Surgeon: Haywood Lasso, MD;  Location: [place];  Service: General;  Laterality: Left;  . Breast biopsy      Left  . Robotic assisted total hysterectomy with bilateral salpingo oopherectomy Bilateral 12/23/2012    Procedure: ROBOTIC ASSISTED TOTAL HYSTERECTOMY WITH BILATERAL SALPINGO OOPHORECTOMY;  Surgeon: Marvene Staff, MD;  Location: [facility];  Service: Gynecology;  Laterality: Bilateral;  . Cholecystectomy N/A  03/01/2014    Procedure: LAPAROSCOPIC CHOLECYSTECTOMY;  Surgeon: Leighton Ruff, MD;  [facility];  Service: General;  Laterality: N/A;  . Laminectomy N/A 04/13/2014    Procedure: THORACIC LAMINECTOMY WITH FIXATION THORACIC SIX-THORACIC TEN FUSION;  Surgeon: Kristeen Miss, MD;  Location: [place] [facility];  Service: Neurosurgery;  Laterality: N/A;  . Brain surgery-litt Left 11/20/14    LITT procedure for Lt brain met.    There were no vitals filed for this visit.  Visit Diagnosis:  Weakness generalized  Activity intolerance  Abnormality of gait  Difficulty walking      Subjective Assessment - 01/07/15 1118    Subjective Pt walked 66mn using her SPC from her appartment to the PT clinic.    Pertinent History thoracic spine surgery for rod placement 04/12/14-Dr Elsner, LITT procedure to remove tumor (metastatic cancer)   Limitations Standing;Walking   How long can you stand comfortably? 10 min   How long can you walk comfortably? 5 minutes   Patient Stated Goals Increase strength in legs, walk without walker, stand and walk longer to allow return to work   Currently in Pain? No/denies   Multiple Pain Sites No                         [facility] Adult PT Treatment/Exercise - 01/07/15 0001    Bed Mobility   Bed Mobility --  No UBE   Exercises   Exercises Lumbar;Knee/Hip   Lumbar Exercises: Aerobic  Stationary Bike Nu-step L2 x 77mn   Knee/Hip Exercises: Machines for Strengthening   Total Gym Leg Press 55# bil. 3x10, Lt & Rtsingle leg 35# 3x10  increase weight to 60   Knee/Hip Exercises: Standing   Heel Raises Both;2 sets;20 reps   Hip Flexion Stengthening;Both;2 sets;10 reps;Knee bent   Hip Abduction Stengthening;Both;2 sets;10 reps   Hip Extension Stengthening;Both;2 sets;10 reps   Forward Step Up Both;3 sets;Hand Hold: 2;Step Height: 6"  1 x 10 alternating stepping up   Rebounder 3 ways x 1 min each   Knee/Hip Exercises: Seated   Long Arc Quad  Strengthening;Right;10 reps;2 sets;Weights   Long Arc Quad Weight 3 lbs.                  PT Short Term Goals - 12/22/14 0858    PT SHORT TERM GOAL #1   Title be independent in initial HEP   Time 4   Period Weeks   Status Achieved   PT SHORT TERM GOAL #2   Title improve strength and safety to allow to wean from walker to cane 50% of the time in the home   Time 4   Period Weeks   Status Achieved   PT SHORT TERM GOAL #3   Title demonstrate heel strike on the Rt > 50% of the time   Time 4   Period Weeks   Status Partially Met   PT SHORT TERM GOAL #4   Title stand and walk for 10-15 minutes without need to rest   Time 4   Period Weeks   Status Achieved           PT Long Term Goals - 01/05/15 1105    PT LONG TERM GOAL #1   Title be independent in advanced HEP   Time 8   Period Weeks   PT LONG TERM GOAL #2   Title wean from walker to use of cane at least 50% in the community   Status Achieved   PT LONG TERM GOAL #4   Title stand and walk for 20-30 minutes without rest to improve endurance for return to work   Time 8   Period Weeks   Status On-going  18 minutes   PT LONG TERM GOAL #5   Title ascend steps with step-over-step gait with use of 1 rail               Plan - 01/07/15 1127    Clinical Impression Statement Gait and endurance continues to improve,  pt using SPC for community ambulation and only 10% at home. Pt will continue to benefit from skilled PT for endurance, strength and gait training   Pt will benefit from skilled therapeutic intervention in order to improve on the following deficits Abnormal gait;Difficulty walking;Decreased endurance;Decreased activity tolerance;Decreased strength;Decreased balance   Rehab Potential Good   PT Frequency 2x / week   PT Duration 8 weeks   PT Treatment/Interventions ADLs/Self Care Home Management;Therapeutic activities;Patient/family education;Therapeutic exercise;Manual techniques;Gait  training;Neuromuscular re-education;Functional mobility training;Energy conservation;Stair training   PT Next Visit Plan Rt LE strength, endurance, gait, steps. NO UBE (lymph node removal)   Consulted and Agree with Plan of Care Patient        Problem List Patient Active Problem List   Diagnosis Date Noted  . Genetic testing 10/15/2014  . Oral thrush 06/23/2014  . Fever 06/17/2014  . Hypokalemia 06/17/2014  . Breast cancer metastasized to brain ([place]   . Metastatic cancer to spine ([place]  04/20/2014  . Malnutrition of moderate degree ([place]) 04/13/2014  . Myelopathy ([place]) 04/12/2014  . Pathologic fracture of thoracic vertebrae 04/12/2014  . Biliary colic 66/81/5947  . Breast cancer of upper-outer quadrant of right female breast ([place]) 09/24/2013  . S/P total hysterectomy and bilateral salpingo-oophorectomy 12/23/2012  . History of chemotherapy   . Hx of radiation therapy     NAUMANN-HOUEGNIFIO,Aleaha Fickling PTA 01/07/2015, 12:29 PM  [facility] [address], [place], [place], [address] Phone: [phone]   Fax:  [phone]  Name: LIANETTE BROUSSARD MRN: [id number] Date of Birth: 11/15/1970

## 2015-01-12 ENCOUNTER — Other Ambulatory Visit (HOSPITAL_BASED_OUTPATIENT_CLINIC_OR_DEPARTMENT_OTHER): Payer: BC Managed Care – PPO

## 2015-01-12 ENCOUNTER — Ambulatory Visit (HOSPITAL_BASED_OUTPATIENT_CLINIC_OR_DEPARTMENT_OTHER): Payer: BC Managed Care – PPO | Admitting: Nurse Practitioner

## 2015-01-12 ENCOUNTER — Encounter: Payer: Self-pay | Admitting: Nurse Practitioner

## 2015-01-12 ENCOUNTER — Ambulatory Visit (HOSPITAL_BASED_OUTPATIENT_CLINIC_OR_DEPARTMENT_OTHER): Payer: BC Managed Care – PPO

## 2015-01-12 ENCOUNTER — Telehealth: Payer: Self-pay | Admitting: Nurse Practitioner

## 2015-01-12 VITALS — BP 107/61 | HR 91 | Temp 98.9°F | Resp 18 | Ht 68.0 in | Wt 129.8 lb

## 2015-01-12 DIAGNOSIS — C50411 Malignant neoplasm of upper-outer quadrant of right female breast: Secondary | ICD-10-CM

## 2015-01-12 DIAGNOSIS — C50911 Malignant neoplasm of unspecified site of right female breast: Secondary | ICD-10-CM

## 2015-01-12 DIAGNOSIS — C7951 Secondary malignant neoplasm of bone: Secondary | ICD-10-CM | POA: Diagnosis not present

## 2015-01-12 DIAGNOSIS — K769 Liver disease, unspecified: Secondary | ICD-10-CM

## 2015-01-12 DIAGNOSIS — Z170 Estrogen receptor positive status [ER+]: Secondary | ICD-10-CM

## 2015-01-12 DIAGNOSIS — C7931 Secondary malignant neoplasm of brain: Secondary | ICD-10-CM

## 2015-01-12 DIAGNOSIS — C773 Secondary and unspecified malignant neoplasm of axilla and upper limb lymph nodes: Secondary | ICD-10-CM

## 2015-01-12 DIAGNOSIS — R200 Anesthesia of skin: Secondary | ICD-10-CM

## 2015-01-12 DIAGNOSIS — Z5111 Encounter for antineoplastic chemotherapy: Secondary | ICD-10-CM

## 2015-01-12 DIAGNOSIS — C50919 Malignant neoplasm of unspecified site of unspecified female breast: Secondary | ICD-10-CM

## 2015-01-12 LAB — COMPREHENSIVE METABOLIC PANEL
ALBUMIN: 4.1 g/dL (ref 3.5–5.0)
ALK PHOS: 81 U/L (ref 40–150)
ALT: 10 U/L (ref 0–55)
AST: 16 U/L (ref 5–34)
Anion Gap: 9 mEq/L (ref 3–11)
BUN: 13.2 mg/dL (ref 7.0–26.0)
CALCIUM: 9.6 mg/dL (ref 8.4–10.4)
CO2: 27 mEq/L (ref 22–29)
Chloride: 106 mEq/L (ref 98–109)
Creatinine: 0.9 mg/dL (ref 0.6–1.1)
Glucose: 100 mg/dl (ref 70–140)
POTASSIUM: 4.1 meq/L (ref 3.5–5.1)
Sodium: 142 mEq/L (ref 136–145)
Total Protein: 7.2 g/dL (ref 6.4–8.3)

## 2015-01-12 LAB — CBC WITH DIFFERENTIAL/PLATELET
BASO%: 0.8 % (ref 0.0–2.0)
Basophils Absolute: 0 10*3/uL (ref 0.0–0.1)
EOS ABS: 0 10*3/uL (ref 0.0–0.5)
EOS%: 1.8 % (ref 0.0–7.0)
HEMATOCRIT: 31.5 % — AB (ref 34.8–46.6)
HEMOGLOBIN: 10.7 g/dL — AB (ref 11.6–15.9)
LYMPH%: 24 % (ref 14.0–49.7)
MCH: 34 pg (ref 25.1–34.0)
MCHC: 34 g/dL (ref 31.5–36.0)
MCV: 99.8 fL (ref 79.5–101.0)
MONO#: 0.2 10*3/uL (ref 0.1–0.9)
MONO%: 6.8 % (ref 0.0–14.0)
NEUT#: 1.6 10*3/uL (ref 1.5–6.5)
NEUT%: 66.6 % (ref 38.4–76.8)
Platelets: 153 10*3/uL (ref 145–400)
RBC: 3.15 10*6/uL — ABNORMAL LOW (ref 3.70–5.45)
RDW: 16.4 % — ABNORMAL HIGH (ref 11.2–14.5)
WBC: 2.4 10*3/uL — AB (ref 3.9–10.3)
lymph#: 0.6 10*3/uL — ABNORMAL LOW (ref 0.9–3.3)

## 2015-01-12 MED ORDER — FULVESTRANT 250 MG/5ML IM SOLN
500.0000 mg | Freq: Once | INTRAMUSCULAR | Status: AC
  Administered 2015-01-12: 500 mg via INTRAMUSCULAR
  Filled 2015-01-12: qty 10

## 2015-01-12 NOTE — Patient Instructions (Signed)

## 2015-01-12 NOTE — Telephone Encounter (Signed)
Appointments made and avs pritned for patient °

## 2015-01-12 NOTE — Progress Notes (Signed)
[place]  [phone] Fax:(336) [phone]     ID: [name] DOB: [birth year]  MR#: [id number]  YSA#:[id number]  Patient Care Team: [name], MD as PCP - General (Family Medicine) Thea Silversmith, MD (Radiation Oncology) Neldon Mc, MD as Surgeon (General Surgery) Chauncey Cruel, MD as Consulting Physician (Oncology) OTHER M.D.JN Susanne Greenhouse, Electa Sniff Tatter MD  CHIEF COMPLAINT: Estrogen receptor positive breast cancer  CURRENT TREATMENT: fulvestrant, palbociclib, zolendronate  BREAST CANCER HISTORY: From doctor Khan's of original intake node 05/31/2011:  "Carolyn [name] is a 45 y.o. female. Without significant past medical history. She underwent a mammogram that showed calcifications measuring 5 cm on the right breast. She then went on To have an ultrasound which showed the area to measure 2.9 cm. She was also found to have a right axillary lymph node that was suspicious for a malignancy. She had a biopsy of the calcifications that showed high-grade ductal carcinoma in situ. 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 In the lymph node biopsy there was no lymphatic tissue seen and was felt that the node was replaced by tumor. Patient went on to have an MRI of the bilateral breasts performed on evening of 05/30/2011. The MRI showed in the right breast 5 x 3 x 4.5 cm mass abutting the chest wall. About 7 cm away from this there was another mass in the upper inner quadrant that measured 1.5 x 1.7 x 1.5 cm. On the contralateral breast, that is the left breast a 2 cm area suspicious enhancement was noted also this up to date has not been biopsied and arrangements are being made for the biopsy to be performed. In this side there were no suspicious lymph nodes. The prognostic panel is pending. Patient is otherwise without any complaints. "  METASTATIC DISEASE: From the earlier summary  note:  [name] noted some strange feelings around her umbilicus 32/35/5732. This felt like an area of numbness. Over the next 2 days she noted some leg weakness and difficulty walking, so she presented to the ED 04/12/2014. MRI of the thoraco-lumbar spine was obtained 04/12/2014 showing multiple bone lesions and compression fracture at T8 with retropulsion and cord compression. On 04/13/2014 she underwent Laminectomy T8 decompression of spinal cord posterior fixation from T6-T10 with pedicle screws and rods posterior arthrodesis with allograft. 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 HER-2 could not be obtained.  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 There were nonspecific L temporal bone changes and also possible involvement of the clivus and calvarium, but no other parenchymal brain lesions. 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 She continues on fulvestrant monthly, with her next injection due today. She has 3 doses left in this cycle of palbociclib, then she will proceed to have 7 days off. She tolerates both drugs well with few complaints. She does have some hot flashes, but  they are mild and she manages these well on her own. She will be due for zometa in February.   REVIEW OF SYSTEMS: Carolyn [name] still has numbness and tingling along the right side of her body, from the surgery to her brain  late last year. She understands that this will improve slowly. She is working with physical therapy twice weekly and this is helpful. She finds she gets a good workout there and is pleased with her progress. She continues to ambulate with a cane. She denies fevers, chills, nausea, vomiting, or changes in bowel or bladder habits. She has no pain today. She has occasional headaches, but denies weakness, or vision changes. She has shortness of breath with exertion, but denies chest pain, cough, or palpitations. A detailed review of systems is otherwise stable.  PAST MEDICAL HISTORY: Past Medical History  Diagnosis Date  . Breast cancer ([place]) 09/2011    invasive ductal carcinoma metastatic ca in 3/14 lymph nodes  . Hx of radiation therapy 11/08/11 -12/26/11    right chest wall/supraclav fossa, right scar  . History of chemotherapy 06/27/11 -09/04/11    neoadjuvant  . S/P radiation therapy 05/08/14    SRS brain    PAST SURGICAL HISTORY: Past Surgical History  Procedure Laterality Date  . Wisdom tooth extraction    . Portacath placement  06/20/2011    Procedure: INSERTION PORT-A-CATH;  Surgeon: Haywood Lasso, MD;  Location: [place];  Service: General;  Laterality: N/A;  . Modified mastectomy  10/03/2011    Procedure: MODIFIED MASTECTOMY;  Surgeon: Haywood Lasso, MD;  Location: [facility];  Service: General;  Laterality: Right;  . Port-a-cath removal  01/30/2012    Procedure: MINOR REMOVAL PORT-A-CATH;  Surgeon: Haywood Lasso, MD;  Location: [facility];  Service: General;  Laterality: Left;  . Breast biopsy      Left  . Robotic assisted total hysterectomy with bilateral salpingo oopherectomy Bilateral 12/23/2012    Procedure: ROBOTIC ASSISTED TOTAL HYSTERECTOMY WITH BILATERAL SALPINGO OOPHORECTOMY;  Surgeon: Marvene Staff, MD;  Location: [facility];  Service: Gynecology;  Laterality: Bilateral;  . Cholecystectomy N/A 03/01/2014    Procedure: LAPAROSCOPIC  CHOLECYSTECTOMY;  Surgeon: Leighton Ruff, MD;  Location: [facility];  Service: General;  Laterality: N/A;  . Laminectomy N/A 04/13/2014    Procedure: THORACIC LAMINECTOMY WITH FIXATION THORACIC SIX-THORACIC TEN FUSION;  Surgeon: Kristeen Miss, MD;  Location: [facility];  Service: Neurosurgery;  Laterality: N/A;  . Brain surgery-litt Left 11/20/14    LITT procedure for Lt brain met.    FAMILY HISTORY Family History  Problem Relation Age of Onset  . Breast cancer Maternal Aunt 28  . Pancreatic cancer Maternal Grandfather   . Pancreatic cancer Maternal Aunt     died in her 63s  . Leukemia Maternal Aunt     died in her 28s  . Ovarian cancer Cousin 24    maternal cousin   the patient's father is alive, currently 30 years old. The patient's mother died from complications of diabetes at the age of 47. The patient had no brothers, 4 sisters. One sister has died from congestive heart failure. The patient's mother is one of 5 sisters. One of them was diagnosed with breast cancer the age of 76. Also a cousin on the maternal side it was diagnosed with ovarian cancer. This was approximately age 16. There is also colon cancer in the family. The patient has had extensive genetic testing summarized below. She is however  BRCA negative  GYNECOLOGIC HISTORY:  Patient's last menstrual period was 07/02/2011. Menarche age 35, she is GX P0. She underwent total hysterectomy with bilateral salpingo-oophorectomy December 2014.  SOCIAL HISTORY:  Carolyn [name] works at [employer] for [employer]. She mostly deals with government documents. She is single, lives by herself, with no pets. She attends [employer] locally.    ADVANCED DIRECTIVES: Not in place. The patient has a documents and intends to name her sister Kierstan Auer as healthcare power of attorney. Joelene Millin lives in [place] and can be reached at [place]: Social History  Substance Use Topics  . Smoking status: Never Smoker   . Smokeless  tobacco: Never Used  . Alcohol Use: No     Colonoscopy:  PAP:  Bone density:  Lipid panel:  Allergies  Allergen Reactions  . Allegra [Fexofenadine] Hives    Abdomen only  . Shellfish Allergy Hives    Abdomen only    Current Outpatient Prescriptions  Medication Sig Dispense Refill  . Calcium Carbonate-Vit D-Min (CALCIUM 1200 PO) Take 1 tablet by mouth 2 (two) times daily.    . Cholecalciferol (VITAMIN D3) 2000 UNITS TABS Take 2,000 Units by mouth daily.    Leslee Home 100 MG capsule TAKE 1 CAPSULE BY MOUTH DAILY WITH BREAKFAST. TAKE WHOLE WITH FOOD. TAKE DAILY FOR 21 DAYS AND THEN OFF FOR 7 DAYS. 21 capsule 3  . Multiple Vitamin (MULTIVITAMIN) tablet Take 1 tablet by mouth daily.    Marland Kitchen acetaminophen (TYLENOL) 325 MG tablet Take 2 tablets (650 mg total) by mouth every 6 (six) hours as needed for mild pain, moderate pain, fever or headache. (Patient not taking: Reported on 01/12/2015)     No current facility-administered medications for this visit.    OBJECTIVE: Middle-aged [place] American woman who appears stated age, ambulating with a cane Filed Vitals:   01/12/15 1022  BP: 107/61  Pulse: 91  Temp: 98.9 F (37.2 C)  Resp: 18     Body mass index is 19.74 kg/(m^2).    ECOG FS:1 - Symptomatic but completely ambulatory  Skin: warm, dry  HEENT: sclerae anicteric, conjunctivae pink, oropharynx clear. No thrush or mucositis.  Lymph Nodes: No cervical or supraclavicular lymphadenopathy  Lungs: clear to auscultation bilaterally, no rales, wheezes, or rhonci  Heart: regular rate and rhythm  Abdomen: round, soft, non tender, positive bowel sounds  Musculoskeletal: No focal spinal tenderness, no peripheral edema  Neuro: numbness to light touch along entire right side of body, well oriented, positive affect  Breasts: deferred  LAB RESULTS:  CMP     Component Value Date/Time   NA 143 12/21/2014 1509   NA 141 04/21/2014 0910   K 3.5 12/21/2014 1509   K 3.6 04/21/2014 0910   CL  103 04/21/2014 0910   CL 102 01/16/2012 0804   CO2 28 12/21/2014 1509   CO2 27 04/21/2014 0910   GLUCOSE 99 12/21/2014 1509   GLUCOSE 133* 04/21/2014 0910   GLUCOSE 92 01/16/2012 0804   BUN 7.6 12/21/2014 1509   BUN 9 04/21/2014 0910   CREATININE 0.8 12/21/2014 1509   CREATININE 0.77 04/21/2014 0910   CALCIUM 9.2 12/21/2014 1509   CALCIUM 9.5 04/21/2014 0910   PROT 6.9 12/21/2014 1509   PROT 6.3 04/21/2014 0910   ALBUMIN 3.8 12/21/2014 1509   ALBUMIN 3.1* 04/21/2014 0910   AST 17 12/21/2014 1509   AST 26 04/21/2014 0910   ALT 10 12/21/2014 1509   ALT 19 04/21/2014 0910  ALKPHOS 89 12/21/2014 1509   ALKPHOS 99 04/21/2014 0910   BILITOT <0.30 12/21/2014 1509   BILITOT 0.5 04/21/2014 0910   GFRNONAA >90 04/21/2014 0910   GFRAA >90 04/21/2014 0910    I No results found for: SPEP  Lab Results  Component Value Date   WBC 2.4* 01/12/2015   NEUTROABS 1.6 01/12/2015   HGB 10.7* 01/12/2015   HCT 31.5* 01/12/2015   MCV 99.8 01/12/2015   PLT 153 01/12/2015      Chemistry      Component Value Date/Time   NA 143 12/21/2014 1509   NA 141 04/21/2014 0910   K 3.5 12/21/2014 1509   K 3.6 04/21/2014 0910   CL 103 04/21/2014 0910   CL 102 01/16/2012 0804   CO2 28 12/21/2014 1509   CO2 27 04/21/2014 0910   BUN 7.6 12/21/2014 1509   BUN 9 04/21/2014 0910   CREATININE 0.8 12/21/2014 1509   CREATININE 0.77 04/21/2014 0910      Component Value Date/Time   CALCIUM 9.2 12/21/2014 1509   CALCIUM 9.5 04/21/2014 0910   ALKPHOS 89 12/21/2014 1509   ALKPHOS 99 04/21/2014 0910   AST 17 12/21/2014 1509   AST 26 04/21/2014 0910   ALT 10 12/21/2014 1509   ALT 19 04/21/2014 0910   BILITOT <0.30 12/21/2014 1509   BILITOT 0.5 04/21/2014 0910       Lab Results  Component Value Date   LABCA2 24 05/31/2011    No components found for: EGBTD176  No results for input(s): INR in the last 168 hours.  Urinalysis No results found for: COLORURINE  STUDIES: Ct Chest W  Contrast  12/16/2014  CLINICAL DATA:  Metastatic right breast cancer diagnosed in 2013 status post right mastectomy, with bone and brain metastases detected in April 2016, status post brain and spine radiation therapy, with ongoing chemotherapy. EXAM: CT CHEST, ABDOMEN, AND PELVIS WITH CONTRAST TECHNIQUE: Multidetector CT imaging of the chest, abdomen and pelvis was performed following the standard protocol during bolus administration of intravenous contrast. CONTRAST:  167m OMNIPAQUE IOHEXOL 300 MG/ML  SOLN COMPARISON:  04/15/2014 chest CT. 06/08/2011 CT of the chest, abdomen and pelvis and PET CT. FINDINGS: CT CHEST FINDINGS Mediastinum/Nodes: Normal heart size. No pericardial fluid/thickening. Great vessels are normal in course and caliber. No central pulmonary emboli. Normal visualized thyroid. Normal esophagus. Surgical clips are noted throughout the right axilla. Patient is status post right mastectomy, with no recurrent mass in the right chest wall. No pathologically enlarged axillary, mediastinal or hilar lymph nodes. Lungs/Pleura: No pneumothorax. No pleural effusion. Subpleural reticulation in the anterior and apical right upper lobe is stable and likely to represent postradiation change. Stable 3 mm solid right upper lobe pulmonary nodule (series 5/ image 22). Stable 3 mm posterior right lower lobe solid pulmonary nodule (5/27). Separate stable 2 mm and 3 mm right lower lobe pulmonary nodules (5/38 and 5/41). There are 4 stable left lower lobe pulmonary nodules, largest 5 mm (5/44). No acute consolidative airspace disease, new significant pulmonary nodules or lung masses. Musculoskeletal: Stable severe T8 vertebral pathologic compression fracture status post bilateral posterior thoracic spine fusion from T6-T10, with no thoracic spine subluxation. Stable T7, T9 and T12 lytic vertebral metastases. No new focal bone lesions in the thorax. CT ABDOMEN PELVIS FINDINGS Hepatobiliary: There are at least 10  scattered subcentimeter hypodense liver lesions throughout the liver, largest 0.8 cm in segment 8 of the right liver lobe, not appreciably changed since 04/15/2014. Status post cholecystectomy. Bile  ducts are stable and within expected post cholecystectomy limits, with common bile duct diameter of 8 mm. Pancreas: Normal, with no mass or duct dilation. Spleen: Normal size. No mass. Adrenals/Urinary Tract: Normal adrenals. Normal kidneys with no hydronephrosis and no renal mass. Normal bladder. Stomach/Bowel: Grossly normal stomach. Normal caliber small bowel with no small bowel wall thickening. Normal appendix. Normal large bowel with no diverticulosis, large bowel wall thickening or pericolonic fat stranding. Vascular/Lymphatic: Normal caliber abdominal aorta. Patent portal, splenic, hepatic and renal veins. No pathologically enlarged lymph nodes in the abdomen or pelvis. Reproductive: Status post hysterectomy, with no abnormal findings at the vaginal cuff. No adnexal mass. Other: No pneumoperitoneum, ascites or focal fluid collection. Musculoskeletal: Mixed lytic and sclerotic osseous metastases throughout age of the lumbar vertebral bodies and L5 spinous process are not appreciably changed from 04/12/2014 lumbar spine MRI. No new focal bone lesions in the abdomen or pelvis. IMPRESSION: 1. Interval stability of thoracolumbar spine osseous metastases since April 2016 imaging. No new bone metastases. 2. Interval stability of subcentimeter pulmonary nodules in both lungs since 04/15/2014 chest CT. No new pulmonary nodules. 3. Interval stability of subcentimeter hypodense liver lesions, too small to characterize (and possibly benign liver cysts), with no new liver lesions. 4. No lymphadenopathy or new sites of metastatic disease in the chest, abdomen or pelvis. No acute abnormality. Electronically Signed   By: Ilona Sorrel M.D.   On: 12/16/2014 10:01   Nm Bone Scan Whole Body  12/16/2014  CLINICAL DATA:  Breast  cancer. EXAM: NUCLEAR MEDICINE WHOLE BODY BONE SCAN TECHNIQUE: Whole body anterior and posterior images were obtained approximately 3 hours after intravenous injection of radiopharmaceutical. RADIOPHARMACEUTICALS:  25.8 mCi Technetium-25mMDP IV COMPARISON:  None. FINDINGS: Bilateral renal function and excretion. Right T12 punctate area of increased activity noted. This could be benign secondary to a process such as degenerative change, however metastatic focus cannot be excluded. Thoracic spine MRI with gadolinium enhancement can be obtained for further evaluation. Focal area of increased activity noted over the left mandible. This may be related to dental disease. Mandibular series suggested for further evaluation. No other bony abnormalities identified. IMPRESSION: 1. Punctate area of increased activity over the right portion of the T12 vertebral body. This could be benign secondary to a benign process such as degenerative disease, however metastatic focus cannot be excluded . Thoracic spine MRI with gadolinium enhancement can be obtained for further evaluation. 2. Focal area of increased activity noted over the left mandible. This may be related to dental disease. Mandibular series can be obtained for further evaluation . Electronically Signed   By: TMarcello Moores Register   On: 12/16/2014 11:22   Ct Abdomen Pelvis W Contrast  12/16/2014  CLINICAL DATA:  Metastatic right breast cancer diagnosed in 2013 status post right mastectomy, with bone and brain metastases detected in April 2016, status post brain and spine radiation therapy, with ongoing chemotherapy. EXAM: CT CHEST, ABDOMEN, AND PELVIS WITH CONTRAST TECHNIQUE: Multidetector CT imaging of the chest, abdomen and pelvis was performed following the standard protocol during bolus administration of intravenous contrast. CONTRAST:  1057mOMNIPAQUE IOHEXOL 300 MG/ML  SOLN COMPARISON:  04/15/2014 chest CT. 06/08/2011 CT of the chest, abdomen and pelvis and PET CT.  FINDINGS: CT CHEST FINDINGS Mediastinum/Nodes: Normal heart size. No pericardial fluid/thickening. Great vessels are normal in course and caliber. No central pulmonary emboli. Normal visualized thyroid. Normal esophagus. Surgical clips are noted throughout the right axilla. Patient is status post right mastectomy, with no recurrent  mass in the right chest wall. No pathologically enlarged axillary, mediastinal or hilar lymph nodes. Lungs/Pleura: No pneumothorax. No pleural effusion. Subpleural reticulation in the anterior and apical right upper lobe is stable and likely to represent postradiation change. Stable 3 mm solid right upper lobe pulmonary nodule (series 5/ image 22). Stable 3 mm posterior right lower lobe solid pulmonary nodule (5/27). Separate stable 2 mm and 3 mm right lower lobe pulmonary nodules (5/38 and 5/41). There are 4 stable left lower lobe pulmonary nodules, largest 5 mm (5/44). No acute consolidative airspace disease, new significant pulmonary nodules or lung masses. Musculoskeletal: Stable severe T8 vertebral pathologic compression fracture status post bilateral posterior thoracic spine fusion from T6-T10, with no thoracic spine subluxation. Stable T7, T9 and T12 lytic vertebral metastases. No new focal bone lesions in the thorax. CT ABDOMEN PELVIS FINDINGS Hepatobiliary: There are at least 10 scattered subcentimeter hypodense liver lesions throughout the liver, largest 0.8 cm in segment 8 of the right liver lobe, not appreciably changed since 04/15/2014. Status post cholecystectomy. Bile ducts are stable and within expected post cholecystectomy limits, with common bile duct diameter of 8 mm. Pancreas: Normal, with no mass or duct dilation. Spleen: Normal size. No mass. Adrenals/Urinary Tract: Normal adrenals. Normal kidneys with no hydronephrosis and no renal mass. Normal bladder. Stomach/Bowel: Grossly normal stomach. Normal caliber small bowel with no small bowel wall thickening. Normal  appendix. Normal large bowel with no diverticulosis, large bowel wall thickening or pericolonic fat stranding. Vascular/Lymphatic: Normal caliber abdominal aorta. Patent portal, splenic, hepatic and renal veins. No pathologically enlarged lymph nodes in the abdomen or pelvis. Reproductive: Status post hysterectomy, with no abnormal findings at the vaginal cuff. No adnexal mass. Other: No pneumoperitoneum, ascites or focal fluid collection. Musculoskeletal: Mixed lytic and sclerotic osseous metastases throughout age of the lumbar vertebral bodies and L5 spinous process are not appreciably changed from 04/12/2014 lumbar spine MRI. No new focal bone lesions in the abdomen or pelvis. IMPRESSION: 1. Interval stability of thoracolumbar spine osseous metastases since April 2016 imaging. No new bone metastases. 2. Interval stability of subcentimeter pulmonary nodules in both lungs since 04/15/2014 chest CT. No new pulmonary nodules. 3. Interval stability of subcentimeter hypodense liver lesions, too small to characterize (and possibly benign liver cysts), with no new liver lesions. 4. No lymphadenopathy or new sites of metastatic disease in the chest, abdomen or pelvis. No acute abnormality. Electronically Signed   By: Ilona Sorrel M.D.   On: 12/16/2014 10:01    ASSESSMENT: 45 y.o. BRCA negative [place] woman status post right breast upper outer quadrant and right axillary lymph node biopsy 05/18/2011, both positive for an invasive ductal carcinoma, high-grade, clinicallyT2 N1-2 or stage IIB/IIIA,  estrogen receptor 100% positive, progesterone receptor 87% positive, with an MIB-1 of 14% and no HER-2 amplification ([id number]).  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   (a) VUS were also found in [place] and BARD1  (2) additional right breast biopsy upper inner quadrant 06/08/2011 showed only a fibroadenoma, and central left breast biopsy for another  suspicious lesion showed only fibrocystic changes ([id number] and [id number]).   (3)Treated neoadjuvantly with cyclophosphamide and docetaxel x4 completed 08/29/2011.  (4) status post right modified radical mastectomy 10/03/2011 showing a residual pT1c pN1a (3/18 lymph nodes positive) invasive ductal carcinoma, grade 1,estrogen receptor 100% positive, progesterone receptor negative, with no HER-2 amplification ([id number])  (5) adjuvant radiation to the right chest wall, right supraclavicular  fossa and right scar completed 12/26/2011  (6) tamoxifen started January 2014-- discontinued April 2016 with evidence of metastatic disease  (7) status post total hysterectomy with bilateral salpingo-oophorectomy 12/23/2012 with benign pathology ([id number])  METASTATIC DISEASE 04/13/2014 (8) presented with T8 cord compression and underwent laminectomy 04/13/2014 with T8 decompression of spinal cord, posterior fixation from T6-T10 with pedicle screws and rods, posterior arthrodesis with allograft. 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 ExacTrac Snap verification was performed for each couch angle.   (b) radiation to the T-spine completed 05/08/2014.  (c) [facility]  (11) started fulvestrant 05/18/2014 and Palbociclib 06/01/2014  (a) Palbociclib dose decreased to 100 mg daily, 21/7 as of 08/05/2014  (12) started zolendronate 05/18/2014, repeated every 12 weeks  PLAN: WeMalaiyahooks and  feels well today. Her only complaint is the right sided numbness that she is experiencing, which will slowly improve over time. She will continue to work with physical therapy twice weekly.   The labs were reviewed in detail and were stable. Her ANC is 1.6 today, with just 3 doses of palbociclib left in this cycle. She is due for her next fulvestrant injection today, and will return on 2/8 for her next zometa infusion.   WeShikitas already scheduled for follow up with Dr. MaJana Hakimn 1/30. She will see me again 4 weeks after that. She understands and agrees with this plan. She knows the goal of treatment in her case is control. She has been encouraged to call with any issues that might arise before her next visit here.    HeLaurie PandaNP   01/12/2015 10:38 AM

## 2015-01-15 ENCOUNTER — Ambulatory Visit: Payer: BC Managed Care – PPO | Attending: Neurosurgery | Admitting: Physical Therapy

## 2015-01-15 ENCOUNTER — Encounter: Payer: Self-pay | Admitting: Physical Therapy

## 2015-01-15 DIAGNOSIS — R269 Unspecified abnormalities of gait and mobility: Secondary | ICD-10-CM

## 2015-01-15 DIAGNOSIS — R262 Difficulty in walking, not elsewhere classified: Secondary | ICD-10-CM

## 2015-01-15 DIAGNOSIS — C7931 Secondary malignant neoplasm of brain: Secondary | ICD-10-CM | POA: Diagnosis present

## 2015-01-15 DIAGNOSIS — R29898 Other symptoms and signs involving the musculoskeletal system: Secondary | ICD-10-CM

## 2015-01-15 DIAGNOSIS — R531 Weakness: Secondary | ICD-10-CM | POA: Diagnosis present

## 2015-01-15 DIAGNOSIS — C50919 Malignant neoplasm of unspecified site of unspecified female breast: Secondary | ICD-10-CM | POA: Diagnosis present

## 2015-01-15 DIAGNOSIS — R6889 Other general symptoms and signs: Secondary | ICD-10-CM | POA: Diagnosis present

## 2015-01-15 NOTE — Therapy (Signed)
[facility] [address] [place], [place], [address] Phone: [phone]   Fax:  [phone]  Physical Therapy Treatment  Patient Details  Name: Carolyn [name] MRN: [id number] Date of Birth: 15-Aug-1970 Referring Provider: Roney Mans, FNP  Encounter Date: 01/15/2015      PT End of Session - 01/15/15 0856    Visit Number 10   Date for PT Re-Evaluation 01/25/15   PT Start Time 0851   PT Stop Time 0930   PT Time Calculation (min) 39 min   Activity Tolerance Patient tolerated treatment well   Behavior During Therapy [facility] for tasks assessed/performed      Past Medical History  Diagnosis Date  . 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 Hx of radiation therapy 11/08/11 -12/26/11    right chest wall/supraclav fossa, right scar  . History of chemotherapy 06/27/11 -09/04/11    neoadjuvant  . S/P radiation therapy 05/08/14    SRS brain    Past Surgical History  Procedure Laterality Date  . Wisdom tooth extraction    . Portacath placement  06/20/2011    Procedure: INSERTION PORT-A-CATH;  Surgeon: Haywood Lasso, MD;  Location: [place];  Service: General;  Laterality: N/A;  . Modified mastectomy  10/03/2011    Procedure: MODIFIED MASTECTOMY;  Surgeon: Haywood Lasso, MD;  Location: [place];  Service: General;  Laterality: Right;  . Port-a-cath removal  01/30/2012    Procedure: MINOR REMOVAL PORT-A-CATH;  Surgeon: Haywood Lasso, MD;  Location: [place];  Service: General;  Laterality: Left;  . Breast biopsy      Left  . Robotic assisted total hysterectomy with bilateral salpingo oopherectomy Bilateral 12/23/2012    Procedure: ROBOTIC ASSISTED TOTAL HYSTERECTOMY WITH BILATERAL SALPINGO OOPHORECTOMY;  Surgeon: Marvene Staff, MD;  Location: [facility];  Service: Gynecology;  Laterality: Bilateral;  . Cholecystectomy N/A  03/01/2014    Procedure: LAPAROSCOPIC CHOLECYSTECTOMY;  Surgeon: Leighton Ruff, MD;  Location: [facility];  Service: General;  Laterality: N/A;  . Laminectomy N/A 04/13/2014    Procedure: THORACIC LAMINECTOMY WITH FIXATION THORACIC SIX-THORACIC TEN FUSION;  Surgeon: Kristeen Miss, MD;  Location: [place] [facility];  Service: Neurosurgery;  Laterality: N/A;  . Brain surgery-litt Left 11/20/14    LITT procedure for Lt brain met.    There were no vitals filed for this visit.  Visit Diagnosis:  Weakness generalized  Activity intolerance  Abnormality of gait  Difficulty walking  Weakness of right leg      Subjective Assessment - 01/15/15 0855    Subjective Pt walked for 15 min with her SPC from her appartment to the PT clinic.    Pertinent History thoracic spine surgery for rod placement 04/12/14-Dr Elsner, LITT procedure to remove tumor (metastatic cancer)   Limitations Standing;Walking   How long can you stand comfortably? 10 min   How long can you walk comfortably? 15   Patient Stated Goals Increase strength in legs, walk without walker, stand and walk longer to allow return to work   Currently in Pain? No/denies   Multiple Pain Sites No                         [facility] Adult PT Treatment/Exercise - 01/15/15 0001    Bed Mobility   Bed Mobility --  No UBE   Exercises   Exercises Lumbar;Knee/Hip  Lumbar Exercises: Aerobic   Stationary Bike Nu-step L2 x 46mn  Seat & arms # 9   Knee/Hip Exercises: Machines for Strengthening   Total Gym Leg Press 60# bil. 3x10, Lt & Rtsingle leg 35# 3x10  Seat# 8, increase weight to 65# or even 70#   Knee/Hip Exercises: Standing   Heel Raises Both;2 sets;20 reps   Hip Flexion --   Forward Step Up Both;3 sets;Hand Hold: 2;Step Height: 6"   Functional Squat 2 sets;10 reps  with 5# in B UE   Knee/Hip Exercises: Seated   Long Arc Quad Strengthening;Both;10 reps   Long Arc Quad Weight 3 lbs.                  PT Short Term Goals  - 12/22/14 0858    PT SHORT TERM GOAL #1   Title be independent in initial HEP   Time 4   Period Weeks   Status Achieved   PT SHORT TERM GOAL #2   Title improve strength and safety to allow to wean from walker to cane 50% of the time in the home   Time 4   Period Weeks   Status Achieved   PT SHORT TERM GOAL #3   Title demonstrate heel strike on the Rt > 50% of the time   Time 4   Period Weeks   Status Partially Met   PT SHORT TERM GOAL #4   Title stand and walk for 10-15 minutes without need to rest   Time 4   Period Weeks   Status Achieved           PT Long Term Goals - 01/15/15 0859    PT LONG TERM GOAL #1   Title be independent in advanced HEP   Time 8   Period Weeks   Status On-going   PT LONG TERM GOAL #2   Title wean from walker to use of cane at least 50% in the community   Time 8   Period Weeks   Status Achieved   PT LONG TERM GOAL #3   Title improve Rt LE strength to 4+/5 throughout to improve endurance and allow for improved stafety with negotiating steps   Time 8   Period Weeks   Status On-going   PT LONG TERM GOAL #4   Title stand and walk for 20-30 minutes without rest to improve endurance for return to work   Time 8   Period Weeks   PT LONG TERM GOAL #5   Title ascend steps with step-over-step gait with use of 1 rail   Time 8   Period Weeks   Status Achieved               Plan - 01/15/15 0857    Clinical Impression Statement Pt appears with improved endurance and strength. Pt will continue to benefit from skilled PT for endurance, strength and gait training.   Pt will benefit from skilled therapeutic intervention in order to improve on the following deficits Abnormal gait;Difficulty walking;Decreased endurance;Decreased activity tolerance;Decreased strength;Decreased balance   Rehab Potential Good   PT Frequency 2x / week   PT Duration 8 weeks   PT Treatment/Interventions ADLs/Self Care Home Management;Therapeutic  activities;Patient/family education;Therapeutic exercise;Manual techniques;Gait training;Neuromuscular re-education;Functional mobility training;Energy conservation;Stair training   PT Next Visit Plan Rt LE strength, endurance, gait, steps. NO UBE (lymph node removal)   Consulted and Agree with Plan of Care Patient        Problem List Patient Active  Problem List   Diagnosis Date Noted  . Genetic testing 10/15/2014  . Oral thrush 06/23/2014  . Fever 06/17/2014  . Hypokalemia 06/17/2014  . Breast cancer metastasized to brain ([place])   . 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 Malnutrition of moderate degree ([place]) 04/13/2014  . Myelopathy ([place]) 04/12/2014  . Pathologic fracture of thoracic vertebrae 04/12/2014  . Biliary colic 51/02/5850  . Breast cancer of upper-outer quadrant of right female breast ([place]) 09/24/2013  . S/P total hysterectomy and bilateral salpingo-oophorectomy 12/23/2012  . History of chemotherapy   . Hx of radiation therapy     NAUMANN-HOUEGNIFIO,Cleaster Shiffer PTA 01/15/2015, 10:28 AM  [facility] [address] [place], [place], [address] Phone: [phone]   Fax:  [phone]  Name: Carolyn [name] MRN: [id number] Date of Birth: 1970/08/07

## 2015-01-18 ENCOUNTER — Encounter: Payer: BC Managed Care – PPO | Admitting: Physical Therapy

## 2015-01-21 ENCOUNTER — Ambulatory Visit: Payer: BC Managed Care – PPO | Admitting: Physical Therapy

## 2015-01-21 ENCOUNTER — Encounter: Payer: Self-pay | Admitting: Physical Therapy

## 2015-01-21 DIAGNOSIS — R531 Weakness: Secondary | ICD-10-CM | POA: Diagnosis not present

## 2015-01-21 DIAGNOSIS — R269 Unspecified abnormalities of gait and mobility: Secondary | ICD-10-CM

## 2015-01-21 DIAGNOSIS — R6889 Other general symptoms and signs: Secondary | ICD-10-CM

## 2015-01-21 DIAGNOSIS — R29898 Other symptoms and signs involving the musculoskeletal system: Secondary | ICD-10-CM

## 2015-01-21 DIAGNOSIS — R262 Difficulty in walking, not elsewhere classified: Secondary | ICD-10-CM

## 2015-01-21 NOTE — Therapy (Signed)
[facility] [address] [place], [place], [address] Phone: [phone]   Fax:  [phone]  Physical Therapy Treatment  Patient Details  Name: MANAIA SAMAD MRN: [id number] Date of Birth: 11-30-1970 Referring Provider: Roney Mans, FNP  Encounter Date: 01/21/2015      PT End of Session - 01/21/15 1649    Visit Number 11   Date for PT Re-Evaluation 01/25/15   PT Start Time 9983   PT Stop Time 1658   PT Time Calculation (min) 45 min   Activity Tolerance Patient tolerated treatment well   Behavior During Therapy [facility] for tasks assessed/performed      Past Medical History  Diagnosis Date  . 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 Hx of radiation therapy 11/08/11 -12/26/11    right chest wall/supraclav fossa, right scar  . History of chemotherapy 06/27/11 -09/04/11    neoadjuvant  . S/P radiation therapy 05/08/14    SRS brain    Past Surgical History  Procedure Laterality Date  . Wisdom tooth extraction    . Portacath placement  06/20/2011    Procedure: INSERTION PORT-A-CATH;  Surgeon: Haywood Lasso, MD;  Location: [place];  Service: General;  Laterality: N/A;  . Modified mastectomy  10/03/2011    Procedure: MODIFIED MASTECTOMY;  Surgeon: Haywood Lasso, MD;  Location: [place];  Service: General;  Laterality: Right;  . Port-a-cath removal  01/30/2012    Procedure: MINOR REMOVAL PORT-A-CATH;  Surgeon: Haywood Lasso, MD;  Location: [place];  Service: General;  Laterality: Left;  . Breast biopsy      Left  . Robotic assisted total hysterectomy with bilateral salpingo oopherectomy Bilateral 12/23/2012    Procedure: ROBOTIC ASSISTED TOTAL HYSTERECTOMY WITH BILATERAL SALPINGO OOPHORECTOMY;  Surgeon: Marvene Staff, MD;  Location: [facility];  Service: Gynecology;  Laterality: Bilateral;  . Cholecystectomy N/A  03/01/2014    Procedure: LAPAROSCOPIC CHOLECYSTECTOMY;  Surgeon: Leighton Ruff, MD;  [facility];  Service: General;  Laterality: N/A;  . Laminectomy N/A 04/13/2014    Procedure: THORACIC LAMINECTOMY WITH FIXATION THORACIC SIX-THORACIC TEN FUSION;  Surgeon: Kristeen Miss, MD;  Location: [place] [facility];  Service: Neurosurgery;  Laterality: N/A;  . Brain surgery-litt Left 11/20/14    LITT procedure for Lt brain met.    There were no vitals filed for this visit.  Visit Diagnosis:  Weakness generalized  Activity intolerance  Abnormality of gait  Difficulty walking  Weakness of right leg      Subjective Assessment - 01/21/15 1642    Subjective Pt rates her overall improvement as 60%, pt continues to ambulate with her SPC up to 37mnutes   Pertinent History thoracic spine surgery for rod placement 04/12/14-Dr Elsner, LITT procedure to remove tumor (metastatic cancer)   How long can you stand comfortably? 10 min   How long can you walk comfortably? 15   Patient Stated Goals Increase strength in legs, walk without walker, stand and walk longer to allow return to work   Currently in Pain? No/denies                         [facility] - 01/21/15 0001    Exercises   Exercises Lumbar;Knee/Hip   Lumbar Exercises: Aerobic   Stationary Bike Nu-step L2 x 178m  seat & arms # 9   Knee/Hip  Exercises: Machines for Strengthening   Total Gym Leg Press 70# bil. 3x10, Lt & Rt leg 35# 3x10  seat # 8   Knee/Hip Exercises: Standing   Heel Raises Both;2 sets;20 reps   Forward Lunges 2 sets;10 reps  standing lunges with poles for balance, challenging for pt   Forward Step Up Both;2 sets;10 reps;Hand Hold: 2;Step Height: 6"   Functional Squat 2 sets;10 reps  with 5# in B UE   Other Standing Knee Exercises standing on blue balance pad with reaching out of BOS x 1 min                  PT Short Term Goals - 12/22/14 0370    PT SHORT TERM GOAL #1    Title be independent in initial HEP   Time 4   Period Weeks   Status Achieved   PT SHORT TERM GOAL #2   Title improve strength and safety to allow to wean from walker to cane 50% of the time in the home   Time 4   Period Weeks   Status Achieved   PT SHORT TERM GOAL #3   Title demonstrate heel strike on the Rt > 50% of the time   Time 4   Period Weeks   Status Partially Met   PT SHORT TERM GOAL #4   Title stand and walk for 10-15 minutes without need to rest   Time 4   Period Weeks   Status Achieved           PT Long Term Goals - 01/21/15 1655    PT LONG TERM GOAL #1   Title be independent in advanced HEP   Time 8   Period Weeks   Status On-going   PT LONG TERM GOAL #2   Title wean from walker to use of cane at least 50% in the community   Time 8   Period Weeks   Status Achieved   PT LONG TERM GOAL #3   Title improve Rt LE strength to 4+/5 throughout to improve endurance and allow for improved stafety with negotiating steps   Time 8   Period Weeks   Status On-going   PT LONG TERM GOAL #4   Title stand and walk for 20-30 minutes without rest to improve endurance for return to work   Time 8   Period Weeks   Status On-going   PT LONG TERM GOAL #5   Title ascend steps with step-over-step gait with use of 1 rail   Time 8   Period Weeks   Status Achieved               Plan - 01/21/15 1654    Clinical Impression Statement Pt continues to improve with strength and endurance. Pt will continue to benefit from skilled PT for endurance, strength and gait training.   Pt will benefit from skilled therapeutic intervention in order to improve on the following deficits Abnormal gait;Difficulty walking;Decreased endurance;Decreased activity tolerance;Decreased strength;Decreased balance   Rehab Potential Good   PT Frequency 2x / week   PT Duration 8 weeks   PT Treatment/Interventions ADLs/Self Care Home Management;Therapeutic activities;Patient/family  education;Therapeutic exercise;Manual techniques;Gait training;Neuromuscular re-education;Functional mobility training;Energy conservation;Stair training   PT Next Visit Plan Rt LE strength, endurance, gait, steps. NO UBE (lymph node removal)   Consulted and Agree with Plan of Care Patient        Problem List Patient Active Problem List   Diagnosis Date Noted  .  Genetic testing 10/15/2014  . Oral thrush 06/23/2014  . Fever 06/17/2014  . Hypokalemia 06/17/2014  . Breast cancer metastasized to brain ([place])   . 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 Malnutrition of moderate degree ([place]) 04/13/2014  . Myelopathy ([place]) 04/12/2014  . Pathologic fracture of thoracic vertebrae 04/12/2014  . Biliary colic 85/02/7739  . Breast cancer of upper-outer quadrant of right female breast ([place]) 09/24/2013  . S/P total hysterectomy and bilateral salpingo-oophorectomy 12/23/2012  . History of chemotherapy   . Hx of radiation therapy     NAUMANN-HOUEGNIFIO,Kirk Sampley PTA 01/21/2015, 5:03 PM  [facility] [address] [place], [place], [address] Phone: [phone]   Fax:  [phone]  Name: TALECIA SHERLIN MRN: [id number] Date of Birth: April 01, 1970

## 2015-01-22 MED FILL — *IBRANCE 100 MG CAPSULE: 100 | 28 days supply | Qty: 21 | Fill #1

## 2015-01-25 ENCOUNTER — Ambulatory Visit: Payer: BC Managed Care – PPO

## 2015-01-25 DIAGNOSIS — R29898 Other symptoms and signs involving the musculoskeletal system: Secondary | ICD-10-CM

## 2015-01-25 DIAGNOSIS — R262 Difficulty in walking, not elsewhere classified: Secondary | ICD-10-CM

## 2015-01-25 DIAGNOSIS — R531 Weakness: Secondary | ICD-10-CM | POA: Diagnosis not present

## 2015-01-25 DIAGNOSIS — R269 Unspecified abnormalities of gait and mobility: Secondary | ICD-10-CM

## 2015-01-25 DIAGNOSIS — R6889 Other general symptoms and signs: Secondary | ICD-10-CM

## 2015-01-25 NOTE — Therapy (Signed)
[facility] [address] [place], [place], [address] Phone: [phone]   Fax:  [phone]  Physical Therapy Treatment  Patient Details  Name: Carolyn [name] MRN: [id number] Date of Birth: 28-Feb-1970 Referring Provider: Roney Mans, FNP  Encounter Date: 01/25/2015      PT End of Session - 01/25/15 1612    Visit Number 12   Date for PT Re-Evaluation 03/08/15   PT Start Time 2878   PT Stop Time 1617   PT Time Calculation (min) 47 min   Activity Tolerance Patient tolerated treatment well   Behavior During Therapy [facility] for tasks assessed/performed      Past Medical History  Diagnosis Date  . 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 Hx of radiation therapy 11/08/11 -12/26/11    right chest wall/supraclav fossa, right scar  . History of chemotherapy 06/27/11 -09/04/11    neoadjuvant  . S/P radiation therapy 05/08/14    SRS brain    Past Surgical History  Procedure Laterality Date  . Wisdom tooth extraction    . Portacath placement  06/20/2011    Procedure: INSERTION PORT-A-CATH;  Surgeon: Haywood Lasso, MD;  Location: [place];  Service: General;  Laterality: N/A;  . Modified mastectomy  10/03/2011    Procedure: MODIFIED MASTECTOMY;  Surgeon: Haywood Lasso, MD;  Location: [place];  Service: General;  Laterality: Right;  . Port-a-cath removal  01/30/2012    Procedure: MINOR REMOVAL PORT-A-CATH;  Surgeon: Haywood Lasso, MD;  Location: [place];  Service: General;  Laterality: Left;  . Breast biopsy      Left  . Robotic assisted total hysterectomy with bilateral salpingo oopherectomy Bilateral 12/23/2012    Procedure: ROBOTIC ASSISTED TOTAL HYSTERECTOMY WITH BILATERAL SALPINGO OOPHORECTOMY;  Surgeon: Marvene Staff, MD;  Location: [facility];  Service: Gynecology;  Laterality: Bilateral;  . Cholecystectomy N/A  03/01/2014    Procedure: LAPAROSCOPIC CHOLECYSTECTOMY;  Surgeon: Leighton Ruff, MD;  [facility];  Service: General;  Laterality: N/A;  . Laminectomy N/A 04/13/2014    Procedure: THORACIC LAMINECTOMY WITH FIXATION THORACIC SIX-THORACIC TEN FUSION;  Surgeon: Kristeen Miss, MD;  Location: [place] [facility];  Service: Neurosurgery;  Laterality: N/A;  . Brain surgery-litt Left 11/20/14    LITT procedure for Lt brain met.    There were no vitals filed for this visit.  Visit Diagnosis:  Weakness generalized - Plan: PT plan of care cert/re-cert  Activity intolerance - Plan: PT plan of care cert/re-cert  Abnormality of gait - Plan: PT plan of care cert/re-cert  Difficulty walking - Plan: PT plan of care cert/re-cert  Weakness of right leg - Plan: PT plan of care cert/re-cert      Subjective Assessment - 01/25/15 1600    Subjective Pt able to walk 15-20 minutes in the community now.     Patient Stated Goals Increase strength in legs, walk without walker, stand and walk longer to allow return to work   Currently in Pain? 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 3x10, Lt & Rt leg 35# 3x10  seat # 8   Knee/Hip Exercises: Standing   Forward Step Up Both;2 sets;10 reps;Hand Hold: 2;Step Height: 6"   Step Down Both;2 sets;10 reps;Hand Hold: 2;Step Height: 6"   Functional Squat 2 sets;10 reps  with 5# in B UE   Rebounder 3 ways x 1 min each   Knee/Hip Exercises: Seated   Long Arc Quad Strengthening;Both;10 reps   Long Arc Quad Weight 3 lbs.   Sit to Sand 20 reps;without UE support   Ankle Exercises: Standing   Heel Raises 20 reps                  PT Short Term Goals - 12/22/14 0858    PT SHORT TERM GOAL #1   Title be independent in initial HEP   Time 4   Period Weeks   Status Achieved   PT SHORT TERM GOAL #2   Title improve strength and safety to allow to wean from walker to cane 50% of the time in the home   Time 4   Period Weeks   Status Achieved   PT SHORT TERM GOAL #3   Title demonstrate heel strike on the Rt > 50% of the time   Time 4   Period Weeks   Status Partially Met   PT SHORT TERM GOAL #4   Title stand and walk for 10-15 minutes without need to rest   Time 4   Period Weeks   Status Achieved           PT Long Term Goals - 01/25/15 1538    PT LONG TERM GOAL #1   Title be independent in advanced HEP   Time 6   Period Weeks   Status On-going   PT LONG TERM GOAL #2   Title wean from walker to use of cane at least 50% in the community   Status Achieved   PT LONG TERM GOAL #3   Title improve Rt LE strength to 4+/5 throughout to improve endurance and allow for improved stafety with negotiating steps   Time 6   Period Weeks   Status On-going   PT LONG TERM GOAL #4   Title stand and  walk for 20-30 minutes without rest to improve endurance for return to work   Time 6   Period Weeks   Status On-going  15-20 minutes   PT LONG TERM GOAL #5   Title  ascend steps with step-over-step gait with use of 1 rail   Status On-going   PT LONG TERM GOAL #6   Title descend steps with step-over-step gait and use of 1 rail at least 50% of the time   Time 6   Period Weeks   Status New               Plan - 01/25/15 1539    Clinical Impression Statement Pt with continued LE weaknes and endurance deficits.  Pt reports 60% overall improvement in endurance with walking long distances in the community.  Pt reports that she continues to feel unstable with descending steps.  Pt will continue to benefit from skilled PT for safe progression of endurance and strength activities to allow for return to prior level of function.,   Pt will benefit from skilled therapeutic intervention in order to improve on the following deficits Abnormal gait;Difficulty walking;Decreased endurance;Decreased activity tolerance;Decreased strength;Decreased balance   Rehab Potential Good   PT Frequency 2x / week   PT Duration 8 weeks   PT Treatment/Interventions ADLs/Self Care Home Management;Therapeutic activities;Patient/family education;Therapeutic exercise;Manual techniques;Gait training;Neuromuscular re-education;Functional mobility training;Energy conservation;Stair training   PT Next Visit Plan Rt LE strength, endurance, gait, steps. NO UBE (lymph node removal)   Consulted and Agree with Plan of Care Patient        Problem List Patient Active Problem List   Diagnosis Date Noted  . Genetic testing 10/15/2014  . Oral thrush 06/23/2014  . Fever 06/17/2014  . Hypokalemia 06/17/2014  . Breast cancer metastasized to brain ([place])   . 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 Malnutrition of moderate degree ([place]) 04/13/2014  . Myelopathy ([place]) 04/12/2014  . Pathologic fracture of thoracic vertebrae  04/12/2014  . Biliary colic 67/28/9791  . Breast cancer of upper-outer quadrant of right female breast ([place]) 09/24/2013  . S/P total hysterectomy and bilateral salpingo-oophorectomy 12/23/2012  . History of chemotherapy   . Hx of radiation therapy     Dionicio Shelnutt, PT 01/25/2015, 4:19 PM  [facility] [address], [place], [place], [address] Phone: [phone]   Fax:  [phone]  Name: Carolyn [name] MRN: [id number] Date of Birth: 07/24/1970

## 2015-01-28 ENCOUNTER — Ambulatory Visit: Payer: BC Managed Care – PPO | Admitting: Physical Therapy

## 2015-01-28 ENCOUNTER — Encounter: Payer: BC Managed Care – PPO | Admitting: Physical Therapy

## 2015-01-28 ENCOUNTER — Encounter: Payer: Self-pay | Admitting: Physical Therapy

## 2015-01-28 DIAGNOSIS — R531 Weakness: Secondary | ICD-10-CM | POA: Diagnosis not present

## 2015-01-28 DIAGNOSIS — R269 Unspecified abnormalities of gait and mobility: Secondary | ICD-10-CM

## 2015-01-28 DIAGNOSIS — R29898 Other symptoms and signs involving the musculoskeletal system: Secondary | ICD-10-CM

## 2015-01-28 DIAGNOSIS — R6889 Other general symptoms and signs: Secondary | ICD-10-CM

## 2015-01-28 DIAGNOSIS — C7931 Secondary malignant neoplasm of brain: Secondary | ICD-10-CM

## 2015-01-28 DIAGNOSIS — R262 Difficulty in walking, not elsewhere classified: Secondary | ICD-10-CM

## 2015-01-28 DIAGNOSIS — C50919 Malignant neoplasm of unspecified site of unspecified female breast: Secondary | ICD-10-CM

## 2015-01-28 NOTE — Therapy (Signed)
[facility] [address], [place], [place], [address] Phone: [phone]   Fax:  [phone]  Physical Therapy Treatment  Patient Details  Name: Carolyn [name] MRN: [id number] Date of Birth: [name] 06, 1971 Referring Provider: Roney Mans, FNP  Encounter Date: 01/28/2015      PT End of Session - 01/28/15 1619    Visit Number 13   Date for PT Re-Evaluation 03/08/15   PT Start Time 1605   PT Stop Time 1650   PT Time Calculation (min) 45 min   Activity Tolerance Patient tolerated treatment well   Behavior During Therapy [facility] for tasks assessed/performed      Past Medical History  Diagnosis Date  . 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 Hx of radiation therapy 11/08/11 -12/26/11    right chest wall/supraclav fossa, right scar  . History of chemotherapy 06/27/11 -09/04/11    neoadjuvant  . S/P radiation therapy 05/08/14    SRS brain    Past Surgical History  Procedure Laterality Date  . Wisdom tooth extraction    . Portacath placement  06/20/2011    Procedure: INSERTION PORT-A-CATH;  Surgeon: Haywood Lasso, MD;  Location: [place];  Service: General;  Laterality: N/A;  . Modified mastectomy  10/03/2011    Procedure: MODIFIED MASTECTOMY;  Surgeon: Haywood Lasso, MD;  Location: [place];  Service: General;  Laterality: Right;  . Port-a-cath removal  01/30/2012    Procedure: MINOR REMOVAL PORT-A-CATH;  Surgeon: Haywood Lasso, MD;  Location: [place];  Service: General;  Laterality: Left;  . Breast biopsy      Left  . Robotic assisted total hysterectomy with bilateral salpingo oopherectomy Bilateral 12/23/2012    Procedure: ROBOTIC ASSISTED TOTAL HYSTERECTOMY WITH BILATERAL SALPINGO OOPHORECTOMY;  Surgeon: Marvene Staff, MD;  Location: [facility];  Service: Gynecology;  Laterality: Bilateral;  . Cholecystectomy N/A  03/01/2014    Procedure: LAPAROSCOPIC CHOLECYSTECTOMY;  Surgeon: Leighton Ruff, MD;  [facility];  Service: General;  Laterality: N/A;  . Laminectomy N/A 04/13/2014    Procedure: THORACIC LAMINECTOMY WITH FIXATION THORACIC SIX-THORACIC TEN FUSION;  Surgeon: Kristeen Miss, MD;  Location: [place] [facility];  Service: Neurosurgery;  Laterality: N/A;  . Brain surgery-litt Left 11/20/14    LITT procedure for Lt brain met.    There were no vitals filed for this visit.  Visit Diagnosis:  Weakness generalized  Activity intolerance  Abnormality of gait  Difficulty walking  Weakness of right leg  Breast cancer metastasized to brain, unspecified laterality [facility])      Subjective Assessment - 01/28/15 1618    Subjective Pt reports feeling a lot better   Pertinent History thoracic spine surgery for rod placement 04/12/14-Dr Elsner, LITT procedure to remove tumor (metastatic cancer)   Limitations Standing;Walking   How long can you stand comfortably? 10 min   How long can you walk comfortably? 15   Patient Stated Goals Increase strength in legs, walk without walker, stand and walk longer to allow return to work   Currently in Pain? No/denies   Multiple Pain Sites No                         [facility] Adult PT Treatment/Exercise - 01/28/15 0001    Lumbar Exercises: Aerobic   Stationary Bike Nu-step L2 x 51mn  seat & arms #9, o.540m  UBE (Upper Arm Bike) No UBE due to lymphnode removal   Knee/Hip Exercises: Machines for Strengthening   Total Gym Leg Press 70# bil. 3x10, Lt & Rt leg 35# 3x10,   increase weight next visit, last set of 10 reps with 40#    Knee/Hip Exercises: Standing   Forward Step Up Both;2 sets;10 reps;Hand Hold: 2;Step Height: 6"   Step Down Both;2 sets;10 reps;Hand Hold: 2;Step Height: 6"   Functional Squat 2 sets;10 reps  with 5# in each UE   Rebounder 3 ways x 1 min each   Other Standing Knee Exercises standing on blue balance pad 3 way raises with 1#  weight   Knee/Hip Exercises: Seated   Sit to Sand 20 reps;without UE support  with blue balance pad under feet, to challenge balance                  PT Short Term Goals - 12/22/14 2563    PT SHORT TERM GOAL #1   Title be independent in initial HEP   Time 4   Period Weeks   Status Achieved   PT SHORT TERM GOAL #2   Title improve strength and safety to allow to wean from walker to cane 50% of the time in the home   Time 4   Period Weeks   Status Achieved   PT SHORT TERM GOAL #3   Title demonstrate heel strike on the Rt > 50% of the time   Time 4   Period Weeks   Status Partially Met   PT SHORT TERM GOAL #4   Title stand and walk for 10-15 minutes without need to rest   Time 4   Period Weeks   Status Achieved           PT Long Term Goals - 01/25/15 1538    PT LONG TERM GOAL #1   Title be independent in advanced HEP   Time 6   Period Weeks   Status On-going   PT LONG TERM GOAL #2   Title wean from walker to use of cane at least 50% in the community   Status Achieved   PT LONG TERM GOAL #3   Title improve Rt LE strength to 4+/5 throughout to improve endurance and allow for improved stafety with negotiating steps   Time 6   Period Weeks   Status On-going   PT LONG TERM GOAL #4   Title stand and walk for 20-30 minutes without rest to improve endurance for return to work   Time 6   Period Weeks   Status On-going  15-20 minutes   PT LONG TERM GOAL #5   Title ascend steps with step-over-step gait with use of 1 rail   Status On-going   PT LONG TERM GOAL #6   Title descend steps with step-over-step gait and use of 1 rail at least 50% of the time   Time 6   Period Weeks   Status New               Plan - 01/28/15 1620    Clinical Impression Statement Pt continues with bil. LE weakness Rt>Lt and with endurance deficits. Pt reports she feels more confidence with walking and at work and at home using her cane less. Pt will continue to benefit from  skilled PT for safe progression of endurance strength to allow for return to prior level of function.    Pt will benefit from skilled therapeutic intervention in order  to improve on the following deficits Abnormal gait;Difficulty walking;Decreased endurance;Decreased activity tolerance;Decreased strength;Decreased balance   Rehab Potential Good   PT Frequency 2x / week   PT Duration 8 weeks   PT Treatment/Interventions ADLs/Self Care Home Management;Therapeutic activities;Patient/family education;Therapeutic exercise;Manual techniques;Gait training;Neuromuscular re-education;Functional mobility training;Energy conservation;Stair training   PT Next Visit Plan Rt LE strength, endurance, gait, steps. NO UBE (lymph node removal)   Consulted and Agree with Plan of Care Patient        Problem List Patient Active Problem List   Diagnosis Date Noted  . Genetic testing 10/15/2014  . Oral thrush 06/23/2014  . Fever 06/17/2014  . Hypokalemia 06/17/2014  . Breast cancer metastasized to brain ([place])   . 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 Malnutrition of moderate degree ([place]) 04/13/2014  . Myelopathy ([place]) 04/12/2014  . Pathologic fracture of thoracic vertebrae 04/12/2014  . Biliary colic 73/40/3709  . Breast cancer of upper-outer quadrant of right female breast ([place]) 09/24/2013  . S/P total hysterectomy and bilateral salpingo-oophorectomy 12/23/2012  . History of chemotherapy   . Hx of radiation therapy     NAUMANN-HOUEGNIFIO,Tasheem Elms PTA 01/28/2015, 4:53 PM  [facility] [address], [place], [place], [address] Phone: [phone]   Fax:  [phone]  Name: DOLORES EWING MRN: [id number] Date of Birth: Apr 27, 1970

## 2015-02-01 ENCOUNTER — Encounter: Payer: Self-pay | Admitting: Physical Therapy

## 2015-02-01 ENCOUNTER — Ambulatory Visit: Payer: BC Managed Care – PPO | Admitting: Physical Therapy

## 2015-02-01 ENCOUNTER — Telehealth: Payer: Self-pay | Admitting: Physical Therapy

## 2015-02-01 DIAGNOSIS — R29898 Other symptoms and signs involving the musculoskeletal system: Secondary | ICD-10-CM

## 2015-02-01 DIAGNOSIS — R269 Unspecified abnormalities of gait and mobility: Secondary | ICD-10-CM

## 2015-02-01 DIAGNOSIS — R262 Difficulty in walking, not elsewhere classified: Secondary | ICD-10-CM

## 2015-02-01 DIAGNOSIS — R531 Weakness: Secondary | ICD-10-CM | POA: Diagnosis not present

## 2015-02-01 DIAGNOSIS — R6889 Other general symptoms and signs: Secondary | ICD-10-CM

## 2015-02-01 NOTE — Therapy (Signed)
[facility] [address], [place], [place], [address] Phone: [phone]   Fax:  [phone]  Physical Therapy Treatment  Patient Details  Name: Carolyn [name] MRN: [id number] Date of Birth: Nov 14, 1970 Referring Provider: Roney Mans, FNP  Encounter Date: 02/01/2015      PT End of Session - 02/01/15 1240    Visit Number 14   Date for PT Re-Evaluation 03/08/15   PT Start Time 1225   PT Stop Time 1308   PT Time Calculation (min) 43 min   Activity Tolerance Patient tolerated treatment well   Behavior During Therapy [facility] for tasks assessed/performed      Past Medical History  Diagnosis Date  . 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 Hx of radiation therapy 11/08/11 -12/26/11    right chest wall/supraclav fossa, right scar  . History of chemotherapy 06/27/11 -09/04/11    neoadjuvant  . S/P radiation therapy 05/08/14    SRS brain    Past Surgical History  Procedure Laterality Date  . Wisdom tooth extraction    . Portacath placement  06/20/2011    Procedure: INSERTION PORT-A-CATH;  Surgeon: Haywood Lasso, MD;  Location: [place];  Service: General;  Laterality: N/A;  . Modified mastectomy  10/03/2011    Procedure: MODIFIED MASTECTOMY;  Surgeon: Haywood Lasso, MD;  Location: [place];  Service: General;  Laterality: Right;  . Port-a-cath removal  01/30/2012    Procedure: MINOR REMOVAL PORT-A-CATH;  Surgeon: Haywood Lasso, MD;  Location: [place];  Service: General;  Laterality: Left;  . Breast biopsy      Left  . Robotic assisted total hysterectomy with bilateral salpingo oopherectomy Bilateral 12/23/2012    Procedure: ROBOTIC ASSISTED TOTAL HYSTERECTOMY WITH BILATERAL SALPINGO OOPHORECTOMY;  Surgeon: Marvene Staff, MD;  Location: [facility];  Service: Gynecology;  Laterality: Bilateral;  . Cholecystectomy N/A  03/01/2014    Procedure: LAPAROSCOPIC CHOLECYSTECTOMY;  Surgeon: Leighton Ruff, MD;  [facility];  Service: General;  Laterality: N/A;  . Laminectomy N/A 04/13/2014    Procedure: THORACIC LAMINECTOMY WITH FIXATION THORACIC SIX-THORACIC TEN FUSION;  Surgeon: Kristeen Miss, MD;  Location: [place] [facility];  Service: Neurosurgery;  Laterality: N/A;  . Brain surgery-litt Left 11/20/14    LITT procedure for Lt brain met.    There were no vitals filed for this visit.  Visit Diagnosis:  Weakness generalized  Activity intolerance  Abnormality of gait  Difficulty walking  Weakness of right leg      Subjective Assessment - 02/01/15 1235    Subjective Feeling ok today.   Currently in Pain? No/denies   Multiple Pain Sites No                         [facility] Adult PT Treatment/Exercise - 02/01/15 0001    Lumbar Exercises: Aerobic   Stationary Bike Nu-step L2 x 81mn  seat & arms #9, o.559m   Knee/Hip Exercises: Machines for Strengthening   Total Gym Leg Press Bil 75# 3x10, Single 40#    Knee/Hip Exercises: Standing   Forward Step Up Both;2 sets;15 reps;Hand Hold: 1;Step Height: 6"   Step Down Both;1 set;15 reps;Hand Hold: 1;Step Height: 6"   Functional Squat 2 sets;15 reps  5# in hands   Rebounder 3 ways x 1 min each  No UE today   Other Standing Knee Exercises  standing on blue balance pad 3 way raises with 1# weight                  PT Short Term Goals - 02/01/15 1239    PT SHORT TERM GOAL #3   Title demonstrate heel strike on the Rt > 50% of the time   Time 4   Period Weeks   Status Partially Met  25%           PT Long Term Goals - 01/25/15 1538    PT LONG TERM GOAL #1   Title be independent in advanced HEP   Time 6   Period Weeks   Status On-going   PT LONG TERM GOAL #2   Title wean from walker to use of cane at least 50% in the community   Status Achieved   PT LONG TERM GOAL #3   Title improve Rt LE strength to 4+/5 throughout to  improve endurance and allow for improved stafety with negotiating steps   Time 6   Period Weeks   Status On-going   PT LONG TERM GOAL #4   Title stand and walk for 20-30 minutes without rest to improve endurance for return to work   Time 6   Period Weeks   Status On-going  15-20 minutes   PT LONG TERM GOAL #5   Title ascend steps with step-over-step gait with use of 1 rail   Status On-going   PT LONG TERM GOAL #6   Title descend steps with step-over-step gait and use of 1 rail at least 50% of the time   Time 6   Period Weeks   Status New               Plan - 02/01/15 1245    Clinical Impression Statement Still only heel striking 25% of the time per pt report. In regards to her prior level of function: the RT leg still feels "heavy" and/or "numb" at times although this is improving.    Pt will benefit from skilled therapeutic intervention in order to improve on the following deficits Abnormal gait;Difficulty walking;Decreased endurance;Decreased activity tolerance;Decreased strength;Decreased balance   Rehab Potential Good   PT Frequency 2x / week   PT Duration 8 weeks   PT Treatment/Interventions ADLs/Self Care Home Management;Therapeutic activities;Patient/family education;Therapeutic exercise;Manual techniques;Gait training;Neuromuscular re-education;Functional mobility training;Energy conservation;Stair training   PT Next Visit Plan Rt LE strength, endurance, gait, steps. NO UBE (lymph node removal)   Consulted and Agree with Plan of Care Patient        Problem List Patient Active Problem List   Diagnosis Date Noted  . Genetic testing 10/15/2014  . Oral thrush 06/23/2014  . Fever 06/17/2014  . Hypokalemia 06/17/2014  . Breast cancer metastasized to brain ([place])   . 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 Malnutrition of moderate degree ([place]) 04/13/2014  . Myelopathy ([place]) 04/12/2014  . Pathologic fracture of thoracic vertebrae 04/12/2014  . Biliary colic  94/17/4081  . Breast cancer of upper-outer quadrant of right female breast ([place]) 09/24/2013  . S/P total hysterectomy and bilateral salpingo-oophorectomy 12/23/2012  . History of chemotherapy   . Hx of radiation therapy     Aleyza Salmi, PTA 02/01/2015, 1:09 PM  [facility] [address] [place], [place], [address] Phone: [phone]   Fax:  [phone]  Name: Carolyn [name] MRN: [id number] Date of Birth: July 08, 1970

## 2015-02-01 NOTE — Telephone Encounter (Signed)
Pt was a no show for her appointment at 9:30 on Monday February 01, 2015. PTA called [phone] home phone and left message to return our call, available work # [phone] was called, however, gentleman who picked up phone does not know [name].  Jeanine Luz Houegnifio PTA

## 2015-02-04 ENCOUNTER — Encounter: Payer: Self-pay | Admitting: Physical Therapy

## 2015-02-04 ENCOUNTER — Ambulatory Visit: Payer: BC Managed Care – PPO | Admitting: Physical Therapy

## 2015-02-04 DIAGNOSIS — R531 Weakness: Secondary | ICD-10-CM | POA: Diagnosis not present

## 2015-02-04 DIAGNOSIS — R6889 Other general symptoms and signs: Secondary | ICD-10-CM

## 2015-02-04 DIAGNOSIS — R29898 Other symptoms and signs involving the musculoskeletal system: Secondary | ICD-10-CM

## 2015-02-04 DIAGNOSIS — R262 Difficulty in walking, not elsewhere classified: Secondary | ICD-10-CM

## 2015-02-04 DIAGNOSIS — R269 Unspecified abnormalities of gait and mobility: Secondary | ICD-10-CM

## 2015-02-04 NOTE — Therapy (Signed)
[facility] [address], [place], [place], [address] Phone: [phone]   Fax:  [phone]  Physical Therapy Treatment  Patient Details  Name: Carolyn [name] MRN: [id number] Date of Birth: 07-02-1970 Referring Provider: Roney Mans, FNP  Encounter Date: 02/04/2015      PT End of Session - 02/04/15 1638    Visit Number 15   Date for PT Re-Evaluation 03/08/15   PT Start Time 1604   PT Stop Time 1650   PT Time Calculation (min) 46 min   Activity Tolerance Patient tolerated treatment well   Behavior During Therapy [facility] for tasks assessed/performed      Past Medical History  Diagnosis Date  . 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 Hx of radiation therapy 11/08/11 -12/26/11    right chest wall/supraclav fossa, right scar  . History of chemotherapy 06/27/11 -09/04/11    neoadjuvant  . S/P radiation therapy 05/08/14    SRS brain    Past Surgical History  Procedure Laterality Date  . Wisdom tooth extraction    . Portacath placement  06/20/2011    Procedure: INSERTION PORT-A-CATH;  Surgeon: Haywood Lasso, MD;  Location: [place];  Service: General;  Laterality: N/A;  . Modified mastectomy  10/03/2011    Procedure: MODIFIED MASTECTOMY;  Surgeon: Haywood Lasso, MD;  Location: [place];  Service: General;  Laterality: Right;  . Port-a-cath removal  01/30/2012    Procedure: MINOR REMOVAL PORT-A-CATH;  Surgeon: Haywood Lasso, MD;  Location: [place];  Service: General;  Laterality: Left;  . Breast biopsy      Left  . Robotic assisted total hysterectomy with bilateral salpingo oopherectomy Bilateral 12/23/2012    Procedure: ROBOTIC ASSISTED TOTAL HYSTERECTOMY WITH BILATERAL SALPINGO OOPHORECTOMY;  Surgeon: Marvene Staff, MD;  Location: [facility];  Service: Gynecology;  Laterality: Bilateral;  . Cholecystectomy N/A  03/01/2014    Procedure: LAPAROSCOPIC CHOLECYSTECTOMY;  Surgeon: Leighton Ruff, MD;  [facility];  Service: General;  Laterality: N/A;  . Laminectomy N/A 04/13/2014    Procedure: THORACIC LAMINECTOMY WITH FIXATION THORACIC SIX-THORACIC TEN FUSION;  Surgeon: Kristeen Miss, MD;  Location: [place] [facility];  Service: Neurosurgery;  Laterality: N/A;  . Brain surgery-litt Left 11/20/14    LITT procedure for Lt brain met.    There were no vitals filed for this visit.  Visit Diagnosis:  Weakness generalized  Activity intolerance  Abnormality of gait  Difficulty walking  Weakness of right leg      Subjective Assessment - 02/04/15 1627    Subjective Feeling good, walking is improving   Pertinent History thoracic spine surgery for rod placement 04/12/14-Dr Elsner, LITT procedure to remove tumor (metastatic cancer)   Limitations Standing;Walking   How long can you stand comfortably? 10 min   How long can you walk comfortably? 15   Patient Stated Goals Increase strength in legs, walk without walker, stand and walk longer to allow return to work   Currently in Pain? No/denies   Multiple Pain Sites No                         [facility] Adult PT Treatment/Exercise - 02/04/15 0001    Bed Mobility   Bed Mobility --  NO UBE   Lumbar Exercises: Aerobic   Stationary Bike Nu-step L2 x 103mn  seat & arms #9  Knee/Hip Exercises: Machines for Strengthening   Total Gym Leg Press Bil 80# 3x10, Single 40#, Lt & Rt  3 x10   seat#8   Knee/Hip Exercises: Standing   Forward Step Up Both;2 sets;15 reps;Hand Hold: 1;Step Height: 6"   Step Down Both;1 set;15 reps;Hand Hold: 1;Step Height: 6"   Functional Squat 2 sets;15 reps  5# in hands   Rebounder 3 ways x 1 min each  no UE support   Other Standing Knee Exercises standing on blue balance pad 3 way raises with 1# weight                  PT Short Term Goals - 02/01/15 1239    PT SHORT TERM GOAL #3   Title demonstrate heel  strike on the Rt > 50% of the time   Time 4   Period Weeks   Status Partially Met  25%           PT Long Term Goals - 01/25/15 1538    PT LONG TERM GOAL #1   Title be independent in advanced HEP   Time 6   Period Weeks   Status On-going   PT LONG TERM GOAL #2   Title wean from walker to use of cane at least 50% in the community   Status Achieved   PT LONG TERM GOAL #3   Title improve Rt LE strength to 4+/5 throughout to improve endurance and allow for improved stafety with negotiating steps   Time 6   Period Weeks   Status On-going   PT LONG TERM GOAL #4   Title stand and walk for 20-30 minutes without rest to improve endurance for return to work   Time 6   Period Weeks   Status On-going  15-20 minutes   PT LONG TERM GOAL #5   Title ascend steps with step-over-step gait with use of 1 rail   Status On-going   PT LONG TERM GOAL #6   Title descend steps with step-over-step gait and use of 1 rail at least 50% of the time   Time 6   Period Weeks   Status New               Plan - 02/04/15 1639    Clinical Impression Statement The right leg still feels "heavy" and "numb" at times but improving   Pt will benefit from skilled therapeutic intervention in order to improve on the following deficits Abnormal gait;Difficulty walking;Decreased endurance;Decreased activity tolerance;Decreased strength;Decreased balance   Rehab Potential Good   PT Frequency 2x / week   PT Duration 8 weeks   PT Treatment/Interventions ADLs/Self Care Home Management;Therapeutic activities;Patient/family education;Therapeutic exercise;Manual techniques;Gait training;Neuromuscular re-education;Functional mobility training;Energy conservation;Stair training   PT Next Visit Plan Rt LE strength, endurance, gait, steps. NO UBE (lymph node removal)   Consulted and Agree with Plan of Care Patient        Problem List Patient Active Problem List   Diagnosis Date Noted  . Genetic testing  10/15/2014  . Oral thrush 06/23/2014  . Fever 06/17/2014  . Hypokalemia 06/17/2014  . Breast cancer metastasized to brain ([place])   . 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 Malnutrition of moderate degree ([place]) 04/13/2014  . Myelopathy ([place]) 04/12/2014  . Pathologic fracture of thoracic vertebrae 04/12/2014  . Biliary colic 07/05/9483  . Breast cancer of upper-outer quadrant of right female breast ([place]) 09/24/2013  . S/P total hysterectomy and bilateral salpingo-oophorectomy 12/23/2012  . History of  chemotherapy   . Hx of radiation therapy     NAUMANN-HOUEGNIFIO,Raiden Yearwood PTA 02/04/2015, 4:52 PM  [facility] [address] [place], [place], [address] Phone: [phone]   Fax:  [phone]  Name: Carolyn [name] MRN: [id number] Date of Birth: 30-May-1970

## 2015-02-08 ENCOUNTER — Telehealth: Payer: Self-pay | Admitting: Oncology

## 2015-02-08 ENCOUNTER — Ambulatory Visit (HOSPITAL_BASED_OUTPATIENT_CLINIC_OR_DEPARTMENT_OTHER): Payer: BC Managed Care – PPO

## 2015-02-08 ENCOUNTER — Ambulatory Visit (HOSPITAL_BASED_OUTPATIENT_CLINIC_OR_DEPARTMENT_OTHER): Payer: BC Managed Care – PPO | Admitting: Oncology

## 2015-02-08 ENCOUNTER — Other Ambulatory Visit (HOSPITAL_BASED_OUTPATIENT_CLINIC_OR_DEPARTMENT_OTHER): Payer: BC Managed Care – PPO

## 2015-02-08 VITALS — BP 109/70 | HR 97 | Temp 97.6°F | Resp 18 | Wt 126.8 lb

## 2015-02-08 DIAGNOSIS — Z5111 Encounter for antineoplastic chemotherapy: Secondary | ICD-10-CM | POA: Diagnosis not present

## 2015-02-08 DIAGNOSIS — C50411 Malignant neoplasm of upper-outer quadrant of right female breast: Secondary | ICD-10-CM

## 2015-02-08 DIAGNOSIS — C7931 Secondary malignant neoplasm of brain: Principal | ICD-10-CM

## 2015-02-08 DIAGNOSIS — C7951 Secondary malignant neoplasm of bone: Secondary | ICD-10-CM

## 2015-02-08 DIAGNOSIS — M899 Disorder of bone, unspecified: Secondary | ICD-10-CM | POA: Diagnosis not present

## 2015-02-08 DIAGNOSIS — R918 Other nonspecific abnormal finding of lung field: Secondary | ICD-10-CM

## 2015-02-08 DIAGNOSIS — C50911 Malignant neoplasm of unspecified site of right female breast: Secondary | ICD-10-CM

## 2015-02-08 DIAGNOSIS — C50919 Malignant neoplasm of unspecified site of unspecified female breast: Secondary | ICD-10-CM

## 2015-02-08 DIAGNOSIS — K769 Liver disease, unspecified: Secondary | ICD-10-CM

## 2015-02-08 DIAGNOSIS — C773 Secondary and unspecified malignant neoplasm of axilla and upper limb lymph nodes: Secondary | ICD-10-CM

## 2015-02-08 DIAGNOSIS — Z170 Estrogen receptor positive status [ER+]: Secondary | ICD-10-CM

## 2015-02-08 LAB — COMPREHENSIVE METABOLIC PANEL
ALK PHOS: 79 U/L (ref 40–150)
ALT: 10 U/L (ref 0–55)
AST: 16 U/L (ref 5–34)
Albumin: 4.1 g/dL (ref 3.5–5.0)
Anion Gap: 11 mEq/L (ref 3–11)
BILIRUBIN TOTAL: 0.36 mg/dL (ref 0.20–1.20)
BUN: 11.3 mg/dL (ref 7.0–26.0)
CO2: 28 meq/L (ref 22–29)
Calcium: 9.7 mg/dL (ref 8.4–10.4)
Chloride: 106 mEq/L (ref 98–109)
Creatinine: 1 mg/dL (ref 0.6–1.1)
EGFR: 77 mL/min/{1.73_m2} — ABNORMAL LOW (ref >90)
GLUCOSE: 93 mg/dL (ref 70–140)
Potassium: 3.7 mEq/L (ref 3.5–5.1)
SODIUM: 144 meq/L (ref 136–145)
Total Protein: 7.6 g/dL (ref 6.4–8.3)

## 2015-02-08 LAB — CBC WITH DIFFERENTIAL/PLATELET
BASO%: 0.8 % (ref 0.0–2.0)
Basophils Absolute: 0 10*3/uL (ref 0.0–0.1)
EOS%: 2.5 % (ref 0.0–7.0)
Eosinophils Absolute: 0.1 10*3/uL (ref 0.0–0.5)
HCT: 35 % (ref 34.8–46.6)
HEMOGLOBIN: 11.9 g/dL (ref 11.6–15.9)
LYMPH%: 28.4 % (ref 14.0–49.7)
MCH: 33.7 pg (ref 25.1–34.0)
MCHC: 34.1 g/dL (ref 31.5–36.0)
MCV: 99 fL (ref 79.5–101.0)
MONO#: 0.1 10*3/uL (ref 0.1–0.9)
MONO%: 6.6 % (ref 0.0–14.0)
NEUT%: 61.7 % (ref 38.4–76.8)
NEUTROS ABS: 1.3 10*3/uL — AB (ref 1.5–6.5)
Platelets: 159 10*3/uL (ref 145–400)
RBC: 3.53 10*6/uL — AB (ref 3.70–5.45)
RDW: 14 % (ref 11.2–14.5)
WBC: 2.1 10*3/uL — AB (ref 3.9–10.3)
lymph#: 0.6 10*3/uL — ABNORMAL LOW (ref 0.9–3.3)

## 2015-02-08 MED ORDER — FULVESTRANT 250 MG/5ML IM SOLN
500.0000 mg | Freq: Once | INTRAMUSCULAR | Status: AC
  Administered 2015-02-08: 500 mg via INTRAMUSCULAR
  Filled 2015-02-08: qty 10

## 2015-02-08 NOTE — Telephone Encounter (Signed)
Appointments are already schedule for following visits. Avs printed

## 2015-02-08 NOTE — Progress Notes (Signed)
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 female. Without significant past medical history. She underwent a mammogram that showed calcifications measuring 5 cm on the right breast. She then went on To have an ultrasound which showed the area to measure 2.9 cm. She was also found to have a right axillary lymph node that was suspicious for a malignancy. She had a biopsy of the calcifications that showed high-grade ductal carcinoma in situ. 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 In the lymph node biopsy there was no lymphatic tissue seen and was felt that the node was replaced by tumor. Patient went on to have an MRI of the bilateral breasts performed on evening of 05/30/2011. The MRI showed in the right breast 5 x 3 x 4.5 cm mass abutting the chest wall. About 7 cm away from this there was another mass in the upper inner quadrant that measured 1.5 x 1.7 x 1.5 cm. On the contralateral breast, that is the left breast a 2 cm area suspicious enhancement was noted also this up to date has not been biopsied and arrangements are being made for the biopsy to be performed. In this side there were no suspicious lymph nodes. The prognostic panel is pending. Patient is otherwise without any complaints. "  METASTATIC DISEASE: From the earlier  summary note:  Colleen noted some strange feelings around her umbilicus 63/84/6659. This felt like an area of numbness. Over the next 2 days she noted some leg weakness and difficulty walking, so she presented to the ED 04/12/2014. MRI of the thoraco-lumbar spine was obtained 04/12/2014 showing multiple bone lesions and compression fracture at T8 with retropulsion and cord compression. On 04/13/2014 she underwent Laminectomy T8 decompression of spinal cord posterior fixation from T6-T10 with pedicle screws and rods posterior arthrodesis with allograft. The pathology from this procedure (SZA [phone]) showed metastatic adenocarcinoma which was estrogen receptor 69% positive, with moderate staining intensity, progesterone receptor negative. HER-2 could not be obtained.  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 There were nonspecific L temporal bone changes and also possible involvement of the clivus and calvarium, but no other parenchymal brain lesions. Further staging studies included a bone scan, which failed to show the lytic lesion seen on other scans, and CTs of the chest, abdomen and pelvis on 06/08/2011, which showed very small right lung and left liver lesions which will require follow-up  Her subsequent history is as detailed below  INTERVAL HISTORY: Malana returns today for follow up of her metastatic breast cancer.  She will receive her fulvestrant again today as she does every 28 days. She tolerates the  Shots without any significant side effects and does not find them painful. She is also on Palbociclib at 100 mgs daily, 21 days on, 7 days off. She will  complete the current cycle February 3 and resume a week later. Finally she is receiving zolendronate every 3 months. Her next zolendronate dose will be febrile 08/29/2015.  REVIEW OF SYSTEMS: Rhiannan  Continue to works full time and enjoys that  but she also enjoyed being off for the holidays. She really needed that week off she said. At work she blames the air conditioning and heating for a slight sinus "cold" which she is treating with over-the-counter medications. She continues to do physical therapy and is doing quite well with that. She thinks she could probably now do the exercises all on her own. She still has some difficulty walking and some numbness, and hot flashes as well, which are moderate. Otherwise a detailed review of systems today was negative and in particular she denies unusual headaches, visual changes, nausea, or vomiting.   PAST MEDICAL HISTORY: Past Medical History  Diagnosis Date  . Breast cancer ([place]) 09/2011    invasive ductal carcinoma metastatic ca in 3/14 lymph nodes  . Hx of radiation therapy 11/08/11 -12/26/11    right chest wall/supraclav fossa, right scar  . History of chemotherapy 06/27/11 -09/04/11    neoadjuvant  . S/P radiation therapy 05/08/14    SRS brain    PAST SURGICAL HISTORY: Past Surgical History  Procedure Laterality Date  . Wisdom tooth extraction    . Portacath placement  06/20/2011    Procedure: INSERTION PORT-A-CATH;  Surgeon: Haywood Lasso, MD;  Location: [facility];  Service: General;  Laterality: N/A;  . Modified mastectomy  10/03/2011    Procedure: MODIFIED MASTECTOMY;  Surgeon: Haywood Lasso, MD;  Location: [facility];  Service: General;  Laterality: Right;  . Port-a-cath removal  01/30/2012    Procedure: MINOR REMOVAL PORT-A-CATH;  Surgeon: Haywood Lasso, MD;  Location: [facility];  Service: General;  Laterality: Left;  . Breast biopsy      Left  . Robotic assisted total hysterectomy with bilateral salpingo oopherectomy Bilateral 12/23/2012    Procedure: ROBOTIC ASSISTED TOTAL HYSTERECTOMY WITH BILATERAL SALPINGO OOPHORECTOMY;  Surgeon: Marvene Staff, MD;  Location: [facility];  Service: Gynecology;  Laterality: Bilateral;  .  Cholecystectomy N/A 03/01/2014    Procedure: LAPAROSCOPIC CHOLECYSTECTOMY;  Surgeon: Leighton Ruff, MD;  Location: [facility];  Service: General;  Laterality: N/A;  . Laminectomy N/A 04/13/2014    Procedure: THORACIC LAMINECTOMY WITH FIXATION THORACIC SIX-THORACIC TEN FUSION;  Surgeon: Kristeen Miss, MD;  Location: [facility];  Service: Neurosurgery;  Laterality: N/A;  . Brain surgery-litt Left 11/20/14    LITT procedure for Lt brain met.    FAMILY HISTORY Family History  Problem Relation Age of Onset  . Breast cancer Maternal Aunt 67  . Pancreatic cancer Maternal Grandfather   . Pancreatic cancer Maternal Aunt     died in her 26s  . Leukemia Maternal Aunt     died in her 20s  . Ovarian cancer Cousin 4    maternal cousin   the patient's father is alive, currently 48 years old. The patient's mother died from complications of diabetes at the age of 23. The patient had no brothers, 4 sisters. One sister has died from congestive heart failure. The patient's mother is one of 5 sisters. One of them was diagnosed with breast cancer the age of 8. Also a cousin on the maternal side it was diagnosed with ovarian cancer. This was approximately age 30. There is also colon cancer in the family. The patient has had  extensive genetic testing summarized below. She is however BRCA negative  GYNECOLOGIC HISTORY:  Patient's last menstrual period was 07/02/2011. Menarche age 25, she is GX P0. She underwent total hysterectomy with bilateral salpingo-oophorectomy December 2014.  SOCIAL HISTORY:  Dandra works at [employer] for [employer]. She mostly deals with government documents. She is single, lives by herself, with no pets. She attends [employer] locally.    ADVANCED DIRECTIVES: Not in place. The patient has a documents and intends to name her sister Calianne Larue as healthcare power of attorney. Joelene Millin lives in [place] and can be reached at [place]: Social History  Substance Use  Topics  . Smoking status: Never Smoker   . Smokeless tobacco: Never Used  . Alcohol Use: No     Colonoscopy:  PAP:  Bone density:  Lipid panel:  Allergies  Allergen Reactions  . Allegra [Fexofenadine] Hives    Abdomen only  . Shellfish Allergy Hives    Abdomen only    Current Outpatient Prescriptions  Medication Sig Dispense Refill  . acetaminophen (TYLENOL) 325 MG tablet Take 2 tablets (650 mg total) by mouth every 6 (six) hours as needed for mild pain, moderate pain, fever or headache. (Patient not taking: Reported on 01/12/2015)    . Calcium Carbonate-Vit D-Min (CALCIUM 1200 PO) Take 1 tablet by mouth 2 (two) times daily.    . Cholecalciferol (VITAMIN D3) 2000 UNITS TABS Take 2,000 Units by mouth daily.    Leslee Home 100 MG capsule TAKE 1 CAPSULE BY MOUTH DAILY WITH BREAKFAST. TAKE WHOLE WITH FOOD. TAKE DAILY FOR 21 DAYS AND THEN OFF FOR 7 DAYS. 21 capsule 3  . Multiple Vitamin (MULTIVITAMIN) tablet Take 1 tablet by mouth daily.     No current facility-administered medications for this visit.    OBJECTIVE: Middle-aged [place] American woman  In no acute distress Filed Vitals:   02/08/15 0943  BP: 109/70  Pulse: 97  Temp: 97.6 F (36.4 C)  Resp: 18     Body mass index is 19.28 kg/(m^2).    ECOG FS:1 - Symptomatic but completely ambulatory  Sclerae unicteric, pupils round and equal , EOMs intact Oropharynx clear and moist-- no thrush or other lesions No cervical or supraclavicular adenopathy Lungs no rales or rhonchi Heart regular rate and rhythm Abd soft, nontender, positive bowel sounds MSK no focal spinal tenderness, no upper extremity lymphedema Neuro: nonfocal, and specifically motor is 5 over 5 all major groups testedwell oriented, appropriate affect Breasts:  deferred   LAB RESULTS:  CMP     Component Value Date/Time   NA 144 02/08/2015 0930   NA 141 04/21/2014 0910   K 3.7 02/08/2015 0930   K 3.6 04/21/2014 0910   CL 103 04/21/2014 0910   CL 102  01/16/2012 0804   CO2 28 02/08/2015 0930   CO2 27 04/21/2014 0910   GLUCOSE 93 02/08/2015 0930   GLUCOSE 133* 04/21/2014 0910   GLUCOSE 92 01/16/2012 0804   BUN 11.3 02/08/2015 0930   BUN 9 04/21/2014 0910   CREATININE 1.0 02/08/2015 0930   CREATININE 0.77 04/21/2014 0910   CALCIUM 9.7 02/08/2015 0930   CALCIUM 9.5 04/21/2014 0910   PROT 7.6 02/08/2015 0930   PROT 6.3 04/21/2014 0910   ALBUMIN 4.1 02/08/2015 0930   ALBUMIN 3.1* 04/21/2014 0910   AST 16 02/08/2015 0930   AST 26 04/21/2014 0910   ALT 10 02/08/2015 0930   ALT 19 04/21/2014 0910   ALKPHOS 79 02/08/2015  0930   ALKPHOS 99 04/21/2014 0910   BILITOT 0.36 02/08/2015 0930   BILITOT 0.5 04/21/2014 0910   GFRNONAA >90 04/21/2014 0910   GFRAA >90 04/21/2014 0910    I No results found for: SPEP  Lab Results  Component Value Date   WBC 2.1* 02/08/2015   NEUTROABS 1.3* 02/08/2015   HGB 11.9 02/08/2015   HCT 35.0 02/08/2015   MCV 99.0 02/08/2015   PLT 159 02/08/2015      Chemistry      Component Value Date/Time   NA 144 02/08/2015 0930   NA 141 04/21/2014 0910   K 3.7 02/08/2015 0930   K 3.6 04/21/2014 0910   CL 103 04/21/2014 0910   CL 102 01/16/2012 0804   CO2 28 02/08/2015 0930   CO2 27 04/21/2014 0910   BUN 11.3 02/08/2015 0930   BUN 9 04/21/2014 0910   CREATININE 1.0 02/08/2015 0930   CREATININE 0.77 04/21/2014 0910      Component Value Date/Time   CALCIUM 9.7 02/08/2015 0930   CALCIUM 9.5 04/21/2014 0910   ALKPHOS 79 02/08/2015 0930   ALKPHOS 99 04/21/2014 0910   AST 16 02/08/2015 0930   AST 26 04/21/2014 0910   ALT 10 02/08/2015 0930   ALT 19 04/21/2014 0910   BILITOT 0.36 02/08/2015 0930   BILITOT 0.5 04/21/2014 0910       Lab Results  Component Value Date   LABCA2 24 05/31/2011    No components found for: JOINO676  No results for input(s): INR in the last 168 hours.  Urinalysis No results found for: COLORURINE  STUDIES: No results found.  ASSESSMENT: 45 y.o. BRCA negative  [place] woman status post right breast upper outer quadrant and right axillary lymph node biopsy 05/18/2011, both positive for an invasive ductal carcinoma, high-grade, clinicallyT2 N1-2 or stage IIB/IIIA,  estrogen receptor 100% positive, progesterone receptor 87% positive, with an MIB-1 of 14% and no HER-2 amplification ([id number]).  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   (a) VUS were also found in [place] and BARD1  (2) additional right breast biopsy upper inner quadrant 06/08/2011 showed only a fibroadenoma, and central left breast biopsy for another suspicious lesion showed only fibrocystic changes ([id number] and [id number]).   (3)Treated neoadjuvantly with cyclophosphamide and docetaxel x4 completed 08/29/2011.  (4) status post right modified radical mastectomy 10/03/2011 showing a residual pT1c pN1a (3/18 lymph nodes positive) invasive ductal carcinoma, grade 1,estrogen receptor 100% positive, progesterone receptor negative, with no HER-2 amplification ([id number])  (5) adjuvant radiation to the right chest wall, right supraclavicular fossa and right scar completed 12/26/2011  (6) tamoxifen started January 2014-- discontinued April 2016 with evidence of metastatic disease  (7) status post total hysterectomy with bilateral salpingo-oophorectomy 12/23/2012 with benign pathology ([id number])  METASTATIC DISEASE 04/13/2014 (8) presented with T8 cord compression and underwent laminectomy 04/13/2014 with T8 decompression of spinal cord, posterior fixation from T6-T10 with pedicle screws and rods, posterior arthrodesis with allograft. 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 ExacTrac Snap  verification was performed for each couch angle.   (b) radiation to the T-spine completed 05/08/2014.  (c) "Auto-LITT" procedure performed at [facility] on 11/20/14  (11) started fulvestrant 05/18/2014 and Palbociclib 06/01/2014  (a) Palbociclib dose decreased to 100 mg daily, 21/7 as of 08/05/2014  (12) started zolendronate 05/18/2014, repeated every 12 weeks  PLAN: Shelsey Rieth to do very well and clinically is in remission. We are going to continue the fulvestrant today and every 28 days, together with Palbociclib I 100 mg daily, 21 days on 7 days off, so long as there is no evidence of disease  Progression and so long as she continues to tolerated well. She receives zolendronate every 3 months for her bone metastases and she tolerates that also without any significant side effects.  She is already scheduled for a brain MRI in 2 or 3 weeks. She will follow-up with Dr. Lisbeth Renshaw and Dr. Carles Collet her regarding the brain lesions. Otherwise she will see [place] again with her February fulvestrant and we will continue to see her on a monthly basis for at least 3 months after that , with restaging studies (chest CT and bone scan) in May. If all remains stable at that time we will continue the monthly treatments but move the visits here to every 2 months.   She has a good understanding of this plan. She knows to call for any problems that may develop before her next visit here.   Chauncey Cruel, MD   02/08/2015 10:17 AM

## 2015-02-09 ENCOUNTER — Telehealth: Payer: Self-pay | Admitting: Oncology

## 2015-02-09 ENCOUNTER — Other Ambulatory Visit: Payer: Self-pay | Admitting: *Deleted

## 2015-02-09 DIAGNOSIS — C50411 Malignant neoplasm of upper-outer quadrant of right female breast: Secondary | ICD-10-CM

## 2015-02-09 NOTE — Telephone Encounter (Signed)
Left message for patient informing her of a lab appointment prior to infusion on 2/8 per 1/31 pof

## 2015-02-15 ENCOUNTER — Ambulatory Visit: Payer: BC Managed Care – PPO | Attending: Neurosurgery

## 2015-02-15 DIAGNOSIS — R531 Weakness: Secondary | ICD-10-CM

## 2015-02-15 DIAGNOSIS — R269 Unspecified abnormalities of gait and mobility: Secondary | ICD-10-CM | POA: Diagnosis present

## 2015-02-15 DIAGNOSIS — R29898 Other symptoms and signs involving the musculoskeletal system: Secondary | ICD-10-CM | POA: Insufficient documentation

## 2015-02-15 DIAGNOSIS — R6889 Other general symptoms and signs: Secondary | ICD-10-CM

## 2015-02-15 DIAGNOSIS — R262 Difficulty in walking, not elsewhere classified: Secondary | ICD-10-CM | POA: Diagnosis present

## 2015-02-15 NOTE — Therapy (Signed)
[facility] [address] [place], [place], [address] Phone: [phone]   Fax:  [phone]  Physical Therapy Treatment  Patient Details  Name: Carolyn [name] MRN: [id number] Date of Birth: 1970-03-14 Referring Provider: Roney Mans, FNP  Encounter Date: 02/15/2015      PT End of Session - 02/15/15 1008    Visit Number 16   PT Start Time 0935   PT Stop Time 1015   PT Time Calculation (min) 40 min   Activity Tolerance Patient tolerated treatment well   Behavior During Therapy [facility] for tasks assessed/performed      Past Medical History  Diagnosis Date  . Breast cancer ([place]) 09/2011    invasive ductal carcinoma metastatic ca in 3/14 lymph nodes  . Hx of radiation therapy 11/08/11 -12/26/11    right chest wall/supraclav fossa, right scar  . History of chemotherapy 06/27/11 -09/04/11    neoadjuvant  . S/P radiation therapy 05/08/14    SRS brain    Past Surgical History  Procedure Laterality Date  . Wisdom tooth extraction    . Portacath placement  06/20/2011    Procedure: INSERTION PORT-A-CATH;  Surgeon: Haywood Lasso, MD;  Location: [place] [facility];  Service: General;  Laterality: N/A;  . Modified mastectomy  10/03/2011    Procedure: MODIFIED MASTECTOMY;  Surgeon: Haywood Lasso, MD;  Location: [facility];  Service: General;  Laterality: Right;  . Port-a-cath removal  01/30/2012    Procedure: MINOR REMOVAL PORT-A-CATH;  Surgeon: Haywood Lasso, MD;  Location: [facility];  Service: General;  Laterality: Left;  . Breast biopsy      Left  . Robotic assisted total hysterectomy with bilateral salpingo oopherectomy Bilateral 12/23/2012    Procedure: ROBOTIC ASSISTED TOTAL HYSTERECTOMY WITH BILATERAL SALPINGO OOPHORECTOMY;  Surgeon: Marvene Staff, MD;  Location: [facility];  Service: Gynecology;  Laterality: Bilateral;  . Cholecystectomy N/A 03/01/2014    Procedure: LAPAROSCOPIC  CHOLECYSTECTOMY;  Surgeon: Leighton Ruff, MD;  [facility];  Service: General;  Laterality: N/A;  . Laminectomy N/A 04/13/2014    Procedure: THORACIC LAMINECTOMY WITH FIXATION THORACIC SIX-THORACIC TEN FUSION;  Surgeon: Kristeen Miss, MD;  Location: [facility];  Service: Neurosurgery;  Laterality: N/A;  . Brain surgery-litt Left 11/20/14    LITT procedure for Lt brain met.    There were no vitals filed for this visit.  Visit Diagnosis:  Weakness generalized  Activity intolerance  Abnormality of gait  Difficulty walking      Subjective Assessment - 02/15/15 0938    Subjective Endurance is building.  Walking longer.   Currently in Pain? No/denies                         [facility] Adult PT Treatment/Exercise - 02/15/15 0001    Knee/Hip Exercises: Aerobic   Stationary Bike Level 3 x10 minutes   Knee/Hip Exercises: Machines for Strengthening   Total Gym Leg Press Bil 80# 3x10, Single 40#, Lt & Rt  3 x10   seat#8   Knee/Hip Exercises: Standing   Forward Step Up Both;2 sets;15 reps;Hand Hold: 1;Step Height: 6"   Step Down Both;1 set;15 reps;Hand Hold: 1;Step Height: 6"   Functional Squat 2 sets;15 reps  5# in hands   Rebounder 3 ways x 1 min each  no UE support   Walking with Sports Cord 20# forward and reverse x10 each  PT Short Term Goals - 02/01/15 1239    PT SHORT TERM GOAL #3   Title demonstrate heel strike on the Rt > 50% of the time   Time 4   Period Weeks   Status Partially Met  25%           PT Long Term Goals - 02/15/15 9675    PT LONG TERM GOAL #1   Title be independent in advanced HEP   Time 6   Period Weeks   Status On-going   PT LONG TERM GOAL #2   Title wean from walker to use of cane at least 50% in the community   Status Achieved   PT LONG TERM GOAL #4   Title stand and walk for 20-30 minutes without rest to improve endurance for return to work   Status Achieved   PT LONG TERM GOAL #5   Title  ascend steps with step-over-step gait with use of 1 rail   Time 6   Period Weeks   Status Achieved   PT LONG TERM GOAL #6   Title descend steps with step-over-step gait and use of 1 rail at least 50% of the time   Time 6   Period Weeks   Status On-going               Plan - 02/15/15 0946    Clinical Impression Statement Pt with improved endurance overall.  Pt is now able to walk for 30 minutes in the community without need for rest.  Pt is using cane for all distances for stability.  Pt ascends steps with step-over-step gait and descends with step-to and use of rail for stability.  Pt able to tolerate increased reps and resistance in the clnic.  Pt will continue to benefit from skilled PT for balance, strength and endurance progression to return to prior level of mobility and function.     Pt will benefit from skilled therapeutic intervention in order to improve on the following deficits Abnormal gait;Difficulty walking;Decreased endurance;Decreased activity tolerance;Decreased strength;Decreased balance   Rehab Potential Good   PT Frequency 2x / week   PT Duration 8 weeks   PT Treatment/Interventions ADLs/Self Care Home Management;Therapeutic activities;Patient/family education;Therapeutic exercise;Manual techniques;Gait training;Neuromuscular re-education;Functional mobility training;Energy conservation;Stair training   PT Next Visit Plan Rt LE strength, endurance, gait, steps. NO UBE (lymph node removal)   Consulted and Agree with Plan of Care Patient        Problem List Patient Active Problem List   Diagnosis Date Noted  . Genetic testing 10/15/2014  . Oral thrush 06/23/2014  . Fever 06/17/2014  . Hypokalemia 06/17/2014  . Breast cancer metastasized to brain ([place])   . 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 Malnutrition of moderate degree ([place]) 04/13/2014  . Myelopathy ([place]) 04/12/2014  . Pathologic fracture of thoracic vertebrae 04/12/2014  . Biliary colic  91/63/8466  . Breast cancer of upper-outer quadrant of right female breast ([place]) 09/24/2013  . S/P total hysterectomy and bilateral salpingo-oophorectomy 12/23/2012  . History of chemotherapy   . Hx of radiation therapy     TAKACS,KELLY, PT 02/15/2015, 10:09 AM  [facility] [address], [place], [place], [address] Phone: [phone]   Fax:  [phone]  Name: Carolyn [name] MRN: [id number] Date of Birth: 1970-04-06

## 2015-02-17 ENCOUNTER — Ambulatory Visit (HOSPITAL_BASED_OUTPATIENT_CLINIC_OR_DEPARTMENT_OTHER): Payer: BC Managed Care – PPO

## 2015-02-17 ENCOUNTER — Other Ambulatory Visit (HOSPITAL_BASED_OUTPATIENT_CLINIC_OR_DEPARTMENT_OTHER): Payer: BC Managed Care – PPO

## 2015-02-17 ENCOUNTER — Other Ambulatory Visit: Payer: Self-pay | Admitting: Oncology

## 2015-02-17 VITALS — BP 108/64 | HR 88 | Temp 98.4°F | Resp 18

## 2015-02-17 DIAGNOSIS — C7931 Secondary malignant neoplasm of brain: Secondary | ICD-10-CM

## 2015-02-17 DIAGNOSIS — C7951 Secondary malignant neoplasm of bone: Secondary | ICD-10-CM

## 2015-02-17 DIAGNOSIS — C50411 Malignant neoplasm of upper-outer quadrant of right female breast: Secondary | ICD-10-CM

## 2015-02-17 DIAGNOSIS — C773 Secondary and unspecified malignant neoplasm of axilla and upper limb lymph nodes: Secondary | ICD-10-CM

## 2015-02-17 LAB — CBC WITH DIFFERENTIAL/PLATELET
BASO%: 0.8 % (ref 0.0–2.0)
BASOS ABS: 0 10*3/uL (ref 0.0–0.1)
EOS ABS: 0.1 10*3/uL (ref 0.0–0.5)
EOS%: 2 % (ref 0.0–7.0)
HCT: 32.9 % — ABNORMAL LOW (ref 34.8–46.6)
HGB: 11.5 g/dL — ABNORMAL LOW (ref 11.6–15.9)
LYMPH%: 28.1 % (ref 14.0–49.7)
MCH: 33.4 pg (ref 25.1–34.0)
MCHC: 35 g/dL (ref 31.5–36.0)
MCV: 95.6 fL (ref 79.5–101.0)
MONO#: 0.3 10*3/uL (ref 0.1–0.9)
MONO%: 13.3 % (ref 0.0–14.0)
NEUT#: 1.4 10*3/uL — ABNORMAL LOW (ref 1.5–6.5)
NEUT%: 55.8 % (ref 38.4–76.8)
NRBC: 0 % (ref 0–0)
PLATELETS: 131 10*3/uL — AB (ref 145–400)
RBC: 3.44 10*6/uL — AB (ref 3.70–5.45)
RDW: 12.7 % (ref 11.2–14.5)
WBC: 2.5 10*3/uL — ABNORMAL LOW (ref 3.9–10.3)
lymph#: 0.7 10*3/uL — ABNORMAL LOW (ref 0.9–3.3)

## 2015-02-17 MED ORDER — ZOLEDRONIC ACID 4 MG/100ML IV SOLN
4.0000 mg | Freq: Once | INTRAVENOUS | Status: AC
  Administered 2015-02-17: 4 mg via INTRAVENOUS
  Filled 2015-02-17: qty 100

## 2015-02-17 NOTE — Patient Instructions (Signed)

## 2015-02-18 MED FILL — *IBRANCE 100 MG CAPSULE: 100 | 28 days supply | Qty: 21 | Fill #2

## 2015-02-22 ENCOUNTER — Ambulatory Visit: Payer: BC Managed Care – PPO | Admitting: Physical Therapy

## 2015-02-22 ENCOUNTER — Encounter: Payer: Self-pay | Admitting: Physical Therapy

## 2015-02-22 DIAGNOSIS — R262 Difficulty in walking, not elsewhere classified: Secondary | ICD-10-CM

## 2015-02-22 DIAGNOSIS — R531 Weakness: Secondary | ICD-10-CM | POA: Diagnosis not present

## 2015-02-22 DIAGNOSIS — R29898 Other symptoms and signs involving the musculoskeletal system: Secondary | ICD-10-CM

## 2015-02-22 DIAGNOSIS — R269 Unspecified abnormalities of gait and mobility: Secondary | ICD-10-CM

## 2015-02-22 DIAGNOSIS — R6889 Other general symptoms and signs: Secondary | ICD-10-CM

## 2015-02-22 NOTE — Therapy (Signed)
[facility] [address], [place], [place], [address] Phone: [phone]   Fax:  [phone]  Physical Therapy Treatment  Patient Details  Name: Carolyn [name] MRN: [id number] Date of Birth: March 14, 1970 Referring Provider: Roney Mans, FNP  Encounter Date: 02/22/2015      PT End of Session - 02/22/15 1116    Visit Number 17   Date for PT Re-Evaluation 03/08/15   PT Start Time 1110  10 min late   PT Stop Time 1155   PT Time Calculation (min) 45 min   Activity Tolerance Patient tolerated treatment well   Behavior During Therapy [facility] for tasks assessed/performed      Past Medical History  Diagnosis Date  . 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 Hx of radiation therapy 11/08/11 -12/26/11    right chest wall/supraclav fossa, right scar  . History of chemotherapy 06/27/11 -09/04/11    neoadjuvant  . S/P radiation therapy 05/08/14    SRS brain    Past Surgical History  Procedure Laterality Date  . Wisdom tooth extraction    . Portacath placement  06/20/2011    Procedure: INSERTION PORT-A-CATH;  Surgeon: Haywood Lasso, MD;  Location: [place];  Service: General;  Laterality: N/A;  . Modified mastectomy  10/03/2011    Procedure: MODIFIED MASTECTOMY;  Surgeon: Haywood Lasso, MD;  Location: [place];  Service: General;  Laterality: Right;  . Port-a-cath removal  01/30/2012    Procedure: MINOR REMOVAL PORT-A-CATH;  Surgeon: Haywood Lasso, MD;  Location: [place];  Service: General;  Laterality: Left;  . Breast biopsy      Left  . Robotic assisted total hysterectomy with bilateral salpingo oopherectomy Bilateral 12/23/2012    Procedure: ROBOTIC ASSISTED TOTAL HYSTERECTOMY WITH BILATERAL SALPINGO OOPHORECTOMY;  Surgeon: Marvene Staff, MD;  Location: [place] [facility];  Service: Gynecology;  Laterality: Bilateral;  .  Cholecystectomy N/A 03/01/2014    Procedure: LAPAROSCOPIC CHOLECYSTECTOMY;  Surgeon: Leighton Ruff, MD;  [facility];  Service: General;  Laterality: N/A;  . Laminectomy N/A 04/13/2014    Procedure: THORACIC LAMINECTOMY WITH FIXATION THORACIC SIX-THORACIC TEN FUSION;  Surgeon: Kristeen Miss, MD;  Location: [place] [facility];  Service: Neurosurgery;  Laterality: N/A;  . Brain surgery-litt Left 11/20/14    LITT procedure for Lt brain met.    There were no vitals filed for this visit.  Visit Diagnosis:  Weakness generalized  Activity intolerance  Abnormality of gait  Difficulty walking  Weakness of right leg      Subjective Assessment - 02/22/15 1114    Subjective Numbness in foot about the same but pt reports a slight improvement in her upper leg in regards of increased sensation. " I can definitely walk longer."   Currently in Pain? No/denies                         [facility] Adult PT Treatment/Exercise - 02/22/15 0001    Knee/Hip Exercises: Aerobic   Stationary Bike Level 3 x10 minutes   Knee/Hip Exercises: Machines for Strengthening   Total Gym Leg Press Bil 80# 3x10, Single 40#, Lt & Rt  3 x10   seat#8, vc for full knee extension with control   Knee/Hip Exercises: Standing   Forward Step Up Both;2 sets;10 reps;Hand Hold: 1;Step Height: 8"  VC/TC for leg alignment   Functional Squat 2  sets;15 reps  5# in hands   Rebounder 3 ways x 1 min each  no UE support   Walking with Sports Cord 20# forward and reverse x10 each  close SBA                  PT Short Term Goals - 02/22/15 1117    PT SHORT TERM GOAL #3   Title demonstrate heel strike on the Rt > 50% of the time   Time 4   Period Weeks   Status Partially Met  About the same pt reports today.           PT Long Term Goals - 02/15/15 5643    PT LONG TERM GOAL #1   Title be independent in advanced HEP   Time 6   Period Weeks   Status On-going   PT LONG TERM GOAL #2   Title wean from  walker to use of cane at least 50% in the community   Status Achieved   PT LONG TERM GOAL #4   Title stand and walk for 20-30 minutes without rest to improve endurance for return to work   Status Achieved   PT LONG TERM GOAL #5   Title ascend steps with step-over-step gait with use of 1 rail   Time 6   Period Weeks   Status Achieved   PT LONG TERM GOAL #6   Title descend steps with step-over-step gait and use of 1 rail at least 50% of the time   Time 6   Period Weeks   Status On-going               Plan - 02/22/15 1130    Clinical Impression Statement Worked today on full RT knee extension during her exercises today with control. This is difficult to do, yet pt was able to show improved ability to fully extend knee. Numbness proximally better (slightly). Dorsiflexion remains decreased, still only 25% of the time when walking.     Pt will benefit from skilled therapeutic intervention in order to improve on the following deficits Abnormal gait;Difficulty walking;Decreased endurance;Decreased activity tolerance;Decreased strength;Decreased balance   Rehab Potential Good   PT Frequency 2x / week   PT Duration 8 weeks   PT Treatment/Interventions ADLs/Self Care Home Management;Therapeutic activities;Patient/family education;Therapeutic exercise;Manual techniques;Gait training;Neuromuscular re-education;Functional mobility training;Energy conservation;Stair training   PT Next Visit Plan Rt LE strength, endurance, gait, steps. NO UBE (lymph node removal)   Consulted and Agree with Plan of Care Patient        Problem List Patient Active Problem List   Diagnosis Date Noted  . Genetic testing 10/15/2014  . Oral thrush 06/23/2014  . Fever 06/17/2014  . Hypokalemia 06/17/2014  . Breast cancer metastasized to brain ([place])   . 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 Malnutrition of moderate degree ([place]) 04/13/2014  . Myelopathy ([place]) 04/12/2014  . Pathologic fracture of  thoracic vertebrae 04/12/2014  . Biliary colic 32/95/1884  . Breast cancer of upper-outer quadrant of right female breast ([place]) 09/24/2013  . S/P total hysterectomy and bilateral salpingo-oophorectomy 12/23/2012  . History of chemotherapy   . Hx of radiation therapy     Shabreka Coulon, PTA 02/22/2015, 12:02 PM  [facility] [address] [place], [place], [address] Phone: [phone]   Fax:  [phone]  Name: Carolyn [name] MRN: [id number] Date of Birth: [birth year]

## 2015-03-02 ENCOUNTER — Ambulatory Visit
Admission: RE | Admit: 2015-03-02 | Discharge: 2015-03-02 | Disposition: A | Discharge status: Home / Self Care | Payer: BC Managed Care – PPO | Source: Ambulatory Visit | Attending: Radiation Oncology | Admitting: Radiation Oncology

## 2015-03-02 DIAGNOSIS — C7949 Secondary malignant neoplasm of other parts of nervous system: Principal | ICD-10-CM

## 2015-03-02 DIAGNOSIS — C7931 Secondary malignant neoplasm of brain: Secondary | ICD-10-CM

## 2015-03-02 MED ORDER — GADOBENATE DIMEGLUMINE 529 MG/ML IV SOLN
11.0000 mL | Freq: Once | INTRAVENOUS | Status: AC | PRN
  Administered 2015-03-02: 11 mL via INTRAVENOUS

## 2015-03-03 ENCOUNTER — Ambulatory Visit
Admission: RE | Admit: 2015-03-03 | Discharge: 2015-03-03 | Disposition: A | Discharge status: Home / Self Care | Payer: BC Managed Care – PPO | Source: Ambulatory Visit | Attending: Radiation Oncology | Admitting: Radiation Oncology

## 2015-03-03 ENCOUNTER — Encounter: Payer: Self-pay | Admitting: Radiation Oncology

## 2015-03-03 VITALS — BP 105/65 | HR 95 | Temp 98.2°F | Ht 68.0 in | Wt 128.6 lb

## 2015-03-03 DIAGNOSIS — C50411 Malignant neoplasm of upper-outer quadrant of right female breast: Secondary | ICD-10-CM

## 2015-03-03 DIAGNOSIS — C50911 Malignant neoplasm of unspecified site of right female breast: Secondary | ICD-10-CM

## 2015-03-03 DIAGNOSIS — C7931 Secondary malignant neoplasm of brain: Secondary | ICD-10-CM

## 2015-03-03 NOTE — Progress Notes (Signed)
Carolyn [name] is here for follow up of 1 dose of stereotactic radiosurgery to a left parietal lesion completed 04/29/14. She denies any side effects from radiation. She is here to have her MRI from 03/02/15 read. She reports she is eating well, has no nausea or vomiting. She reports using glasses since her surgery, and her vision is normal with them on. She is receiving physical therapy to help with side effects from surgery on her right side, but finishes this Thursday.   BP 105/65 mmHg  Pulse 95  Temp(Src) 98.2 F (36.8 C)  Ht 5\' 8"  (1.727 m)  Wt 128 lb 9.6 oz (58.333 kg)  BMI 19.56 kg/m2  SpO2 100%  LMP 07/02/2011   Wt Readings from Last 3 Encounters:  03/03/15 128 lb 9.6 oz (58.333 kg)  03/02/15 126 lb (57.153 kg)  02/08/15 126 lb 12.8 oz (57.516 kg)

## 2015-03-03 NOTE — Progress Notes (Signed)
.  Radiation Oncology         [phone]) [phone] ________________________________  Name: Carolyn [name] MRN: [id number]  Date: 03/03/2015  DOB: 07/26/1970  Follow-Up Visit Note  CC: [name], MD  [name], MD  Diagnosis:   Metastatic cancer right breast with brain metastasis S/P SRS  Interval Since Last Radiation: Completed stereotactic radiosurgery to a left parietal lesion on 04/29/2014 to a dose of 20 gray, followed by auto litt procedure week for most of November 2016   Narrative:  The patient returns today for routine follow-up. She did undergo an MRI of the brain on 03/02/2015. A thin surgical tract through the left parietal lobe into the white matter lesion was present with chronic blood products along the tract consistent with her recent auto litt procedure. A small amount of hemorrhage is present within the lesion with vasogenic edema nearly resolved. The enhancing lesion also shows decreased enhancement compared to the previous study, and lesion measures 19 x 18 mm, and previously was noted to be 19 x 14 mm. No new enhancing lesions are present. There is an enhancing lesion left temporal bone unchanged from previous studies and groundglass appearance.  On review of systems, the patient reports that she is doing extremely well overall. She is regaining her strength and is working with physical therapy regularly. She plans to finish out from physical therapy treatments this week. She is eating well and denies any abdominal pain, nausea, vomiting, bowel or bladder dysfunction. She states that she is not experiencing any tinnitus, popliteal disturbances, visual disturbances, headaches, blurred vision, but does use glasses help with her visual acuity. She denies chest pain or shortness of breath fevers or chills. A complete review of systems is obtained and is otherwise negative.                              ALLERGIES:  is allergic to allegra and shellfish allergy.  Meds: Current  Outpatient Prescriptions  Medication Sig Dispense Refill  . acetaminophen (TYLENOL) 325 MG tablet Take 2 tablets (650 mg total) by mouth every 6 (six) hours as needed for mild pain, moderate pain, fever or headache.    . Calcium Carbonate-Vit D-Min (CALCIUM 1200 PO) Take 1 tablet by mouth 2 (two) times daily.    . Cholecalciferol (VITAMIN D3) 2000 UNITS TABS Take 2,000 Units by mouth daily.    Leslee Home 100 MG capsule TAKE 1 CAPSULE BY MOUTH DAILY WITH BREAKFAST. TAKE WHOLE WITH FOOD. TAKE DAILY FOR 21 DAYS AND THEN OFF FOR 7 DAYS. 21 capsule 3  . Multiple Vitamin (MULTIVITAMIN) tablet Take 1 tablet by mouth daily.     No current facility-administered medications for this encounter.    Physical Findings: The patient is in no acute distress. Patient is alert and oriented.  height is 5\' 8"  (1.727 m) and weight is 128 lb 9.6 oz (58.333 kg). Her temperature is 98.2 F (36.8 C). Her blood pressure is 105/65 and her pulse is 95. Her oxygen saturation is 100%. .  No significant changes.  Lab Findings: Lab Results  Component Value Date   WBC 2.5* 02/17/2015   WBC 5.5 04/21/2014   HGB 11.5* 02/17/2015   HGB 10.8* 04/21/2014   HCT 32.9* 02/17/2015   HCT 33.5* 04/21/2014   PLT 131* 02/17/2015   PLT 264 04/21/2014    Lab Results  Component Value Date   NA 144 02/08/2015   NA 141 04/21/2014  K 3.7 02/08/2015   K 3.6 04/21/2014   CHLORIDE 106 02/08/2015   CO2 28 02/08/2015   CO2 27 04/21/2014   GLUCOSE 93 02/08/2015   GLUCOSE 133* 04/21/2014   GLUCOSE 92 01/16/2012   BUN 11.3 02/08/2015   BUN 9 04/21/2014   CREATININE 1.0 02/08/2015   CREATININE 0.77 04/21/2014   BILITOT 0.36 02/08/2015   BILITOT 0.5 04/21/2014   ALKPHOS 79 02/08/2015   ALKPHOS 99 04/21/2014   AST 16 02/08/2015   AST 26 04/21/2014   ALT 10 02/08/2015   ALT 19 04/21/2014   PROT 7.6 02/08/2015   PROT 6.3 04/21/2014   ALBUMIN 4.1 02/08/2015   ALBUMIN 3.1* 04/21/2014   CALCIUM 9.7 02/08/2015   CALCIUM 9.5  04/21/2014   ANIONGAP 11 02/08/2015   ANIONGAP 11 04/21/2014    Radiographic Findings: Mr Jeri Cos Wo Contrast  03/02/2015  CLINICAL DATA:  Metastatic breast cancer. History of radiosurgery left parietal lesion with subsequent laser ablation. EXAM: MRI HEAD WITHOUT AND WITH CONTRAST TECHNIQUE: Multiplanar, multiecho pulse sequences of the brain and surrounding structures were obtained without and with intravenous contrast. CONTRAST:  53mL MULTIHANCE GADOBENATE DIMEGLUMINE 529 MG/ML IV SOLN COMPARISON:  MRI 09/25/2014, 06/26/2014, CT temporal bone 05/04/2014 FINDINGS: Thin surgical tract through the left parietal lobe into the left parietal white matter lesion. There are chronic blood products along the tract. This is most consistent with interval laser treatment of left parietal metastatic disease. Small amount of hemorrhage is present within the lesion. The vasogenic edema has greatly improved and nearly completely resolved. The enhancing lesion also shows decreased enhancement compared with the prior study. Previously the lesion shows solid enhancement and measured 19 x 14 mm. The lesion is slightly larger now but shows only mild peripheral enhancement. Lesion currently measures 19 x 18 mm. No new enhancing lesions in the brain identified. Enhancing lesion in the left temporal bone is unchanged from prior studies. 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 Ventricle size remains normal. No acute infarct. No shift of the midline structures. IMPRESSION: Interval laser ablation of left parietal white matter metastatic disease. Lesion is slightly smaller on today's study but shows significantly decreased enhancement and decreased edema. Continued MRI imaging follow-up recommended. No new lesions identified. 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 Electronically Signed   By: Franchot Gallo M.D.   On: 03/02/2015 11:25     Impression:  Stage IV metastatic right breast cancer with recent disease in the brain  Plan:  The patient is very well following her SRS treatments. We reviewed the radiographically the lesion medicine treated appears to continue to have posttreatment change. We would recommend repeating an MRI in 3 months time for continued evaluation. She states agreement and understanding and will return for follow-up at that time. Prior to that she experiences any questions or concerns that arise, she will office.  Carola Rhine, PAC

## 2015-03-04 ENCOUNTER — Ambulatory Visit: Payer: BC Managed Care – PPO

## 2015-03-04 DIAGNOSIS — R262 Difficulty in walking, not elsewhere classified: Secondary | ICD-10-CM

## 2015-03-04 DIAGNOSIS — R29898 Other symptoms and signs involving the musculoskeletal system: Secondary | ICD-10-CM

## 2015-03-04 DIAGNOSIS — R531 Weakness: Secondary | ICD-10-CM

## 2015-03-04 DIAGNOSIS — R269 Unspecified abnormalities of gait and mobility: Secondary | ICD-10-CM

## 2015-03-04 DIAGNOSIS — R6889 Other general symptoms and signs: Secondary | ICD-10-CM

## 2015-03-04 NOTE — Therapy (Signed)
[facility] [address], [place], [place], [address] Phone: [phone]   Fax:  [phone]  Physical Therapy Treatment  Patient Details  Name: Carolyn [name] MRN: [id number] Date of Birth: 1970/08/29 Referring Provider: Roney Mans, FNP  Encounter Date: 03/04/2015      PT End of Session - 03/04/15 1608    Visit Number 18   PT Start Time 1528   PT Stop Time 1609   PT Time Calculation (min) 41 min   Activity Tolerance Patient tolerated treatment well   Behavior During Therapy [facility] for tasks assessed/performed      Past Medical History  Diagnosis Date  . 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 Hx of radiation therapy 11/08/11 -12/26/11    right chest wall/supraclav fossa, right scar  . History of chemotherapy 06/27/11 -09/04/11    neoadjuvant  . S/P radiation therapy 05/08/14    SRS brain    Past Surgical History  Procedure Laterality Date  . Wisdom tooth extraction    . Portacath placement  06/20/2011    Procedure: INSERTION PORT-A-CATH;  Surgeon: Haywood Lasso, MD;  Location: [place];  Service: General;  Laterality: N/A;  . Modified mastectomy  10/03/2011    Procedure: MODIFIED MASTECTOMY;  Surgeon: Haywood Lasso, MD;  Location: [place];  Service: General;  Laterality: Right;  . Port-a-cath removal  01/30/2012    Procedure: MINOR REMOVAL PORT-A-CATH;  Surgeon: Haywood Lasso, MD;  Location: [place];  Service: General;  Laterality: Left;  . Breast biopsy      Left  . Robotic assisted total hysterectomy with bilateral salpingo oopherectomy Bilateral 12/23/2012    Procedure: ROBOTIC ASSISTED TOTAL HYSTERECTOMY WITH BILATERAL SALPINGO OOPHORECTOMY;  Surgeon: Marvene Staff, MD;  Location: [place] [facility];  Service: Gynecology;  Laterality: Bilateral;  . Cholecystectomy N/A 03/01/2014    Procedure: LAPAROSCOPIC  CHOLECYSTECTOMY;  Surgeon: Leighton Ruff, MD;  [facility];  Service: General;  Laterality: N/A;  . Laminectomy N/A 04/13/2014    Procedure: THORACIC LAMINECTOMY WITH FIXATION THORACIC SIX-THORACIC TEN FUSION;  Surgeon: Kristeen Miss, MD;  Location: [place] [facility];  Service: Neurosurgery;  Laterality: N/A;  . Brain surgery-litt Left 11/20/14    LITT procedure for Lt brain met.    There were no vitals filed for this visit.  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), lesion of Lt frontal lobe of brain (G93.9)   Onset Date/Surgical Date 11/20/14   Prior Function   Level of Independence Independent   Vocation Full time employment   Vocation Requirements [employer]- not working now   Lexicographer Site Ankle;Knee;Hip   Right/Left Hip Left;Right   Right Hip Flexion 4+/5   Right Hip ABduction 4+/5   Left Hip Flexion 4+/5   Right/Left Knee Left;Right   Right Knee Flexion 4+/5   Right Knee Extension 4+/5   Left Knee Flexion 4+/5   Left Knee Extension 4+/5   Right/Left Ankle Left;Right   Right Ankle Dorsiflexion 4+/5   Left Ankle Dorsiflexion 5/5  [place] Adult PT Treatment/Exercise - 03/04/15 0001    Lumbar Exercises: Aerobic   Stationary Bike Nu-step L3 x 16mn  seat & arms #9   Knee/Hip Exercises: Machines for Strengthening   Total Gym Leg Press Bil 80# 3x10, Single 40#, Lt & Rt  3 x10   seat#8, vc for full knee extension with control   Knee/Hip Exercises: Standing   Forward Step Up Both;2 sets;10 reps;Hand Hold: 1;Step Height: 8"  VC/TC for leg alignment   Functional Squat 2 sets;15 reps  5# in hands   Rebounder 3 ways x 1 min each   no UE support   Walking with Sports Cord 20# forward and reverse x10 each  close SBA                  PT Short Term Goals - 02/22/15 1117    PT SHORT TERM GOAL #3   Title demonstrate heel strike on the Rt > 50% of the time   Time 4   Period Weeks   Status Partially Met  About the same pt reports today.           PT Long Term Goals - 03/04/15 1526    PT LONG TERM GOAL #1   Title be independent in advanced HEP   Status Achieved   PT LONG TERM GOAL #2   Title wean from walker to use of cane at least 50% in the community   Status Achieved   PT LONG TERM GOAL #3   Title improve Rt LE strength to 4+/5 throughout to improve endurance and allow for improved stafety with negotiating steps   Status Achieved   PT LONG TERM GOAL #4   Title stand and walk for 20-30 minutes without rest to improve endurance for return to work   Status Achieved   PT LONG TERM GOAL #5   Title ascend steps with step-over-step gait with use of 1 rail   Status Achieved   PT LONG TERM GOAL #6   Title descend steps with step-over-step gait and use of 1 rail at least 50% of the time   Status Achieved               Plan - 03/04/15 1528    Clinical Impression Statement Pt will discharge to HEP today.  Pt is able to ascend and descend steps with step-over-step gait 100% of the time.  Pt uses cane for all distances and is able to walk > 30 minutes without rest or signifcant fatigue.  LEs remain functionally weak with Rt weaker than Lt.  Pt demonstrates improved strength with MMT.  Pt has HEP in place and will continue with HEP for continued strength gains after discharge.     PT Next Visit Plan D/C PT to HEP   Consulted and Agree with Plan of Care Patient        Problem List Patient Active Problem List   Diagnosis Date Noted  . Genetic testing 10/15/2014  . Hypokalemia 06/17/2014  . [place] brain ([place]   . Metastatic cancer to spine ([place] 04/20/2014  .  Malnutrition of moderate degree ([place] 04/13/2014  . Myelopathy ([place] 04/12/2014  . Pathologic fracture of thoracic vertebrae 04/12/2014  . Biliary colic 054/65/6812 . Breast cancer of upper-outer quadrant of right female breast ([place] 09/24/2013  . S/P total hysterectomy and bilateral salpingo-oophorectomy 12/23/2012   PHYSICAL THERAPY DISCHARGE SUMMARY  Visits from Start of Care: 18  Current functional level related  to goals / functional outcomes: See above for current status.     Remaining deficits: Rt LE functional weakness with fair quad control in standing.  Pt has HEP in place to address deficits.   Education / Equipment: HEP Plan: Patient agrees to discharge.  Patient goals were met. Patient is being discharged due to meeting the stated rehab goals.  ?????     Khayree Delellis, PT 03/04/2015, 4:10 PM  [facility] [address] [place], [place], [address] Phone: [phone]   Fax:  [phone]  Name: Carolyn [name] MRN: [id number] Date of Birth: 1970/04/18

## 2015-03-08 ENCOUNTER — Telehealth: Payer: Self-pay | Admitting: Oncology

## 2015-03-08 ENCOUNTER — Encounter: Payer: Self-pay | Admitting: Nurse Practitioner

## 2015-03-08 ENCOUNTER — Ambulatory Visit (HOSPITAL_BASED_OUTPATIENT_CLINIC_OR_DEPARTMENT_OTHER): Payer: BC Managed Care – PPO

## 2015-03-08 ENCOUNTER — Other Ambulatory Visit: Payer: Self-pay | Admitting: *Deleted

## 2015-03-08 ENCOUNTER — Other Ambulatory Visit (HOSPITAL_BASED_OUTPATIENT_CLINIC_OR_DEPARTMENT_OTHER): Payer: BC Managed Care – PPO

## 2015-03-08 ENCOUNTER — Ambulatory Visit (HOSPITAL_BASED_OUTPATIENT_CLINIC_OR_DEPARTMENT_OTHER): Payer: BC Managed Care – PPO | Admitting: Nurse Practitioner

## 2015-03-08 VITALS — BP 101/67 | HR 89 | Temp 98.5°F | Resp 18 | Wt 128.1 lb

## 2015-03-08 DIAGNOSIS — C50911 Malignant neoplasm of unspecified site of right female breast: Secondary | ICD-10-CM

## 2015-03-08 DIAGNOSIS — C7931 Secondary malignant neoplasm of brain: Secondary | ICD-10-CM

## 2015-03-08 DIAGNOSIS — C7951 Secondary malignant neoplasm of bone: Secondary | ICD-10-CM

## 2015-03-08 DIAGNOSIS — Z170 Estrogen receptor positive status [ER+]: Secondary | ICD-10-CM

## 2015-03-08 DIAGNOSIS — C50912 Malignant neoplasm of unspecified site of left female breast: Secondary | ICD-10-CM | POA: Diagnosis not present

## 2015-03-08 DIAGNOSIS — C50411 Malignant neoplasm of upper-outer quadrant of right female breast: Secondary | ICD-10-CM

## 2015-03-08 DIAGNOSIS — Z5111 Encounter for antineoplastic chemotherapy: Secondary | ICD-10-CM | POA: Diagnosis not present

## 2015-03-08 DIAGNOSIS — C773 Secondary and unspecified malignant neoplasm of axilla and upper limb lymph nodes: Secondary | ICD-10-CM

## 2015-03-08 DIAGNOSIS — M2550 Pain in unspecified joint: Secondary | ICD-10-CM

## 2015-03-08 DIAGNOSIS — C50919 Malignant neoplasm of unspecified site of unspecified female breast: Secondary | ICD-10-CM

## 2015-03-08 LAB — CBC WITH DIFFERENTIAL/PLATELET
BASO%: 0.6 % (ref 0.0–2.0)
BASOS ABS: 0 10*3/uL (ref 0.0–0.1)
EOS ABS: 0.1 10*3/uL (ref 0.0–0.5)
EOS%: 2 % (ref 0.0–7.0)
HEMATOCRIT: 35.7 % (ref 34.8–46.6)
HEMOGLOBIN: 12.2 g/dL (ref 11.6–15.9)
LYMPH#: 0.7 10*3/uL — AB (ref 0.9–3.3)
LYMPH%: 26.3 % (ref 14.0–49.7)
MCH: 33.5 pg (ref 25.1–34.0)
MCHC: 34.2 g/dL (ref 31.5–36.0)
MCV: 97.8 fL (ref 79.5–101.0)
MONO#: 0.2 10*3/uL (ref 0.1–0.9)
MONO%: 5.9 % (ref 0.0–14.0)
NEUT%: 65.2 % (ref 38.4–76.8)
NEUTROS ABS: 1.7 10*3/uL (ref 1.5–6.5)
Platelets: 146 10*3/uL (ref 145–400)
RBC: 3.65 10*6/uL — ABNORMAL LOW (ref 3.70–5.45)
RDW: 13.1 % (ref 11.2–14.5)
WBC: 2.6 10*3/uL — AB (ref 3.9–10.3)

## 2015-03-08 LAB — COMPREHENSIVE METABOLIC PANEL
ALBUMIN: 4 g/dL (ref 3.5–5.0)
ALK PHOS: 91 U/L (ref 40–150)
ALT: 10 U/L (ref 0–55)
AST: 17 U/L (ref 5–34)
Anion Gap: 9 mEq/L (ref 3–11)
BUN: 9.2 mg/dL (ref 7.0–26.0)
CALCIUM: 9.4 mg/dL (ref 8.4–10.4)
CO2: 27 mEq/L (ref 22–29)
Chloride: 107 mEq/L (ref 98–109)
Creatinine: 0.9 mg/dL (ref 0.6–1.1)
GLUCOSE: 96 mg/dL (ref 70–140)
POTASSIUM: 4.3 meq/L (ref 3.5–5.1)
Sodium: 143 mEq/L (ref 136–145)
TOTAL PROTEIN: 7.4 g/dL (ref 6.4–8.3)

## 2015-03-08 MED ORDER — PALBOCICLIB 100 MG PO CAPS: ORAL_CAPSULE | ORAL | Status: DC

## 2015-03-08 MED ORDER — FULVESTRANT 250 MG/5ML IM SOLN
500.0000 mg | Freq: Once | INTRAMUSCULAR | Status: AC
  Administered 2015-03-08: 500 mg via INTRAMUSCULAR
  Filled 2015-03-08: qty 10

## 2015-03-08 NOTE — Telephone Encounter (Signed)
Gave patient avs report and appointments for March and April.  °

## 2015-03-08 NOTE — Progress Notes (Signed)
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 female. Without significant past medical history. She underwent a mammogram that showed calcifications measuring 5 cm on the right breast. She then went on To have an ultrasound which showed the area to measure 2.9 cm. She was also found to have a right axillary lymph node that was suspicious for a malignancy. She had a biopsy of the calcifications that showed high-grade ductal carcinoma in situ. 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 In the lymph node biopsy there was no lymphatic tissue seen and was felt that the node was replaced by tumor. Patient went on to have an MRI of the bilateral breasts performed on evening of 05/30/2011. The MRI showed in the right breast 5 x 3 x 4.5 cm mass abutting the chest wall. About 7 cm away from this there was another mass in the upper inner quadrant that measured 1.5 x 1.7 x 1.5 cm. On the contralateral breast, that is the left breast a 2 cm area suspicious enhancement was noted also this up to date has not been biopsied and arrangements are being made for the biopsy to be performed. In this side there were no suspicious lymph nodes. The prognostic panel is pending. Patient is otherwise without any complaints. "  METASTATIC DISEASE: From the earlier  summary note:  Carolyn [name] noted some strange feelings around her umbilicus 37/85/8850. This felt like an area of numbness. Over the next 2 days she noted some leg weakness and difficulty walking, so she presented to the ED 04/12/2014. MRI of the thoraco-lumbar spine was obtained 04/12/2014 showing multiple bone lesions and compression fracture at T8 with retropulsion and cord compression. On 04/13/2014 she underwent Laminectomy T8 decompression of spinal cord posterior fixation from T6-T10 with pedicle screws and rods posterior arthrodesis with allograft. 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 HER-2 could not be obtained.  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 There were nonspecific L temporal bone changes and also possible involvement of the clivus and calvarium, but no other parenchymal brain lesions. Further staging studies included a bone scan, which failed to show the lytic lesion seen on other scans, and CTs of the chest, abdomen and pelvis on 06/08/2011, which showed very small right lung and left liver lesions which will require follow-up  Her subsequent history is as detailed below  INTERVAL HISTORY: Carolyn [name] returns today for follow up of her metastatic breast cancer. She is due for her next fulvestrant injection today. She continue son 179m palbociclib, 21 days on and 7 days off. Her next "off week" will start on 3/4. She tolerates these drug well with minor side effects. She has mild fatigue, daily hot flashes, and joint  aches. The interval history is generally unremarkable.  REVIEW OF SYSTEMS: Carolyn [name] denies fevers, chills, nausea, vomiting, or changes in bowel or bladder habits. She still has tingling to her hands and her legs from the brain  surgery. She is told this will improve slowly. She is done working with physical therapy. She walks well with a cane and has not had any falls. She denies shortness of breath, chest pain, cough, or palpitations. She has no headaches, dizziness, or vision changes. A detailed review of systems is otherwise stable.  PAST MEDICAL HISTORY: Past Medical History  Diagnosis Date  . Breast cancer ([place]) 09/2011    invasive ductal carcinoma metastatic ca in 3/14 lymph nodes  . Hx of radiation therapy 11/08/11 -12/26/11    right chest wall/supraclav fossa, right scar  . History of chemotherapy 06/27/11 -09/04/11    neoadjuvant  . S/P radiation therapy 05/08/14    SRS brain    PAST SURGICAL HISTORY: Past Surgical History  Procedure Laterality Date  . Wisdom tooth extraction    . Portacath placement  06/20/2011    Procedure: INSERTION PORT-A-CATH;  Surgeon: Haywood Lasso, MD;  Location: [facility];  Service: General;  Laterality: N/A;  . Modified mastectomy  10/03/2011    Procedure: MODIFIED MASTECTOMY;  Surgeon: Haywood Lasso, MD;  Location: [facility];  Service: General;  Laterality: Right;  . Port-a-cath removal  01/30/2012    Procedure: MINOR REMOVAL PORT-A-CATH;  Surgeon: Haywood Lasso, MD;  Location: [facility];  Service: General;  Laterality: Left;  . Breast biopsy      Left  . Robotic assisted total hysterectomy with bilateral salpingo oopherectomy Bilateral 12/23/2012    Procedure: ROBOTIC ASSISTED TOTAL HYSTERECTOMY WITH BILATERAL SALPINGO OOPHORECTOMY;  Surgeon: Marvene Staff, MD;  Location: [facility];  Service: Gynecology;  Laterality: Bilateral;  . Cholecystectomy N/A 03/01/2014    Procedure: LAPAROSCOPIC CHOLECYSTECTOMY;  Surgeon: Leighton Ruff, MD;  Location: [facility];  Service: General;  Laterality: N/A;  . Laminectomy N/A 04/13/2014    Procedure: THORACIC LAMINECTOMY WITH FIXATION THORACIC SIX-THORACIC TEN FUSION;  Surgeon: Kristeen Miss, MD;   Location: [facility];  Service: Neurosurgery;  Laterality: N/A;  . Brain surgery-litt Left 11/20/14    LITT procedure for Lt brain met.    FAMILY HISTORY Family History  Problem Relation Age of Onset  . Breast cancer Maternal Aunt 18  . Pancreatic cancer Maternal Grandfather   . Pancreatic cancer Maternal Aunt     died in her 74s  . Leukemia Maternal Aunt     died in her 77s  . Ovarian cancer Cousin 38    maternal cousin   the patient's father is alive, currently 54 years old. The patient's mother died from complications of diabetes at the age of 34. The patient had no brothers, 4 sisters. One sister has died from congestive heart failure. The patient's mother is one of 5 sisters. One of them was diagnosed with breast cancer the age of 42. Also a cousin on the maternal side it was diagnosed with ovarian cancer. This was approximately age 5. There is also colon cancer in the family. The patient has had extensive genetic testing summarized below. She is however BRCA negative  GYNECOLOGIC HISTORY:  Patient's last menstrual period was 07/02/2011. Menarche age 27, she is GX P0. She underwent total hysterectomy with bilateral salpingo-oophorectomy December 2014.  SOCIAL HISTORY:  Carolyn [name] works at [employer] for [employer]. She mostly deals with government documents. She  is single, lives by herself, with no pets. She attends [employer] locally.    ADVANCED DIRECTIVES: Not in place. The patient has a documents and intends to name her sister Carolyn [name] as healthcare power of attorney. Carolyn [name] lives in [place] and can be reached at [place]: Social History  Substance Use Topics  . Smoking status: Never Smoker   . Smokeless tobacco: Never Used  . Alcohol Use: No     Colonoscopy:  PAP:  Bone density:  Lipid panel:  Allergies  Allergen Reactions  . Allegra [Fexofenadine] Hives    Abdomen only  . Shellfish Allergy Hives    Abdomen only    Current  Outpatient Prescriptions  Medication Sig Dispense Refill  . Calcium Carbonate-Vit D-Min (CALCIUM 1200 PO) Take 1 tablet by mouth 2 (two) times daily.    . Cholecalciferol (VITAMIN D3) 2000 UNITS TABS Take 2,000 Units by mouth daily.    Leslee Home 100 MG capsule TAKE 1 CAPSULE BY MOUTH DAILY WITH BREAKFAST. TAKE WHOLE WITH FOOD. TAKE DAILY FOR 21 DAYS AND THEN OFF FOR 7 DAYS. 21 capsule 3  . Multiple Vitamin (MULTIVITAMIN) tablet Take 1 tablet by mouth daily.    Marland Kitchen acetaminophen (TYLENOL) 325 MG tablet Take 2 tablets (650 mg total) by mouth every 6 (six) hours as needed for mild pain, moderate pain, fever or headache. (Patient not taking: Reported on 03/08/2015)     No current facility-administered medications for this visit.    OBJECTIVE: Middle-aged [place] American woman  In no acute distress Filed Vitals:   03/08/15 1012  BP: 101/67  Pulse: 89  Temp: 98.5 F (36.9 C)  Resp: 18     Body mass index is 19.48 kg/(m^2).    ECOG FS:1 - Symptomatic but completely ambulatory  Skin: warm, dry  HEENT: sclerae anicteric, conjunctivae pink, oropharynx clear. No thrush or mucositis.  Lymph Nodes: No cervical or supraclavicular lymphadenopathy  Lungs: clear to auscultation bilaterally, no rales, wheezes, or rhonci  Heart: regular rate and rhythm  Abdomen: round, soft, non tender, positive bowel sounds  Musculoskeletal: No focal spinal tenderness, no peripheral edema  Neuro: non focal, well oriented, positive affect  Breasts: deferred  LAB RESULTS:  CMP     Component Value Date/Time   NA 144 02/08/2015 0930   NA 141 04/21/2014 0910   K 3.7 02/08/2015 0930   K 3.6 04/21/2014 0910   CL 103 04/21/2014 0910   CL 102 01/16/2012 0804   CO2 28 02/08/2015 0930   CO2 27 04/21/2014 0910   GLUCOSE 93 02/08/2015 0930   GLUCOSE 133* 04/21/2014 0910   GLUCOSE 92 01/16/2012 0804   BUN 11.3 02/08/2015 0930   BUN 9 04/21/2014 0910   CREATININE 1.0 02/08/2015 0930   CREATININE 0.77 04/21/2014 0910     CALCIUM 9.7 02/08/2015 0930   CALCIUM 9.5 04/21/2014 0910   PROT 7.6 02/08/2015 0930   PROT 6.3 04/21/2014 0910   ALBUMIN 4.1 02/08/2015 0930   ALBUMIN 3.1* 04/21/2014 0910   AST 16 02/08/2015 0930   AST 26 04/21/2014 0910   ALT 10 02/08/2015 0930   ALT 19 04/21/2014 0910   ALKPHOS 79 02/08/2015 0930   ALKPHOS 99 04/21/2014 0910   BILITOT 0.36 02/08/2015 0930   BILITOT 0.5 04/21/2014 0910   GFRNONAA >90 04/21/2014 0910   GFRAA >90 04/21/2014 0910    I No results found for: SPEP  Lab Results  Component Value Date   WBC 2.6* 03/08/2015  NEUTROABS 1.7 03/08/2015   HGB 12.2 03/08/2015   HCT 35.7 03/08/2015   MCV 97.8 03/08/2015   PLT 146 03/08/2015      Chemistry      Component Value Date/Time   NA 144 02/08/2015 0930   NA 141 04/21/2014 0910   K 3.7 02/08/2015 0930   K 3.6 04/21/2014 0910   CL 103 04/21/2014 0910   CL 102 01/16/2012 0804   CO2 28 02/08/2015 0930   CO2 27 04/21/2014 0910   BUN 11.3 02/08/2015 0930   BUN 9 04/21/2014 0910   CREATININE 1.0 02/08/2015 0930   CREATININE 0.77 04/21/2014 0910      Component Value Date/Time   CALCIUM 9.7 02/08/2015 0930   CALCIUM 9.5 04/21/2014 0910   ALKPHOS 79 02/08/2015 0930   ALKPHOS 99 04/21/2014 0910   AST 16 02/08/2015 0930   AST 26 04/21/2014 0910   ALT 10 02/08/2015 0930   ALT 19 04/21/2014 0910   BILITOT 0.36 02/08/2015 0930   BILITOT 0.5 04/21/2014 0910       Lab Results  Component Value Date   LABCA2 24 05/31/2011    No components found for: [id number]  No results for input(s): INR in the last 168 hours.  Urinalysis No results found for: COLORURINE  STUDIES: Mr Kizzie Fantasia Contrast  17-Mar-2015  CLINICAL DATA:  Metastatic breast cancer. History of radiosurgery left parietal lesion with subsequent laser ablation. EXAM: MRI HEAD WITHOUT AND WITH CONTRAST TECHNIQUE: Multiplanar, multiecho pulse sequences of the brain and surrounding structures were obtained without and with intravenous  contrast. CONTRAST:  11m MULTIHANCE GADOBENATE DIMEGLUMINE 529 MG/ML IV SOLN COMPARISON:  MRI 09/25/2014, 06/26/2014, CT temporal bone 05/04/2014 FINDINGS: Thin surgical tract through the left parietal lobe into the left parietal white matter lesion. There are chronic blood products along the tract. This is most consistent with interval laser treatment of left parietal metastatic disease. Small amount of hemorrhage is present within the lesion. The vasogenic edema has greatly improved and nearly completely resolved. The enhancing lesion also shows decreased enhancement compared with the prior study. Previously the lesion shows solid enhancement and measured 19 x 14 mm. The lesion is slightly larger now but shows only mild peripheral enhancement. Lesion currently measures 19 x 18 mm. No new enhancing lesions in the brain identified. Enhancing lesion in the left temporal bone is unchanged from prior studies. 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 Ventricle size remains normal. No acute infarct. No shift of the midline structures. IMPRESSION: Interval laser ablation of left parietal white matter metastatic disease. Lesion is slightly smaller on today's study but shows significantly decreased enhancement and decreased edema. Continued MRI imaging follow-up recommended. No new lesions identified. 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 Electronically Signed   By: CFranchot GalloM.D.   On: 0Mar 08, 201711:25    ASSESSMENT: 45y.o. BRCA negative [place] woman status post right breast upper outer quadrant and right axillary lymph node biopsy 05/18/2011, both positive for an invasive ductal carcinoma, high-grade, clinicallyT2 N1-2 or stage IIB/IIIA,  estrogen receptor 100% positive, progesterone receptor 87% positive, with an MIB-1 of 14% and no HER-2 amplification ([id number].  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   (a) VUS were also found in [place] BARD1  (2) additional right breast biopsy upper inner quadrant 06/08/2011 showed only a fibroadenoma, and  central left breast biopsy for another suspicious lesion showed only fibrocystic changes ([id number] and [id number]).   (3)Treated neoadjuvantly with cyclophosphamide and docetaxel x4 completed 08/29/2011.  (4) status post right modified radical mastectomy 10/03/2011 showing a residual pT1c pN1a (3/18 lymph nodes positive) invasive ductal carcinoma, grade 1,estrogen receptor 100% positive, progesterone receptor negative, with no HER-2 amplification ([id number])  (5) adjuvant radiation to the right chest wall, right supraclavicular fossa and right scar completed 12/26/2011  (6) tamoxifen started January 2014-- discontinued April 2016 with evidence of metastatic disease  (7) status post total hysterectomy with bilateral salpingo-oophorectomy 12/23/2012 with benign pathology ([id number])  METASTATIC DISEASE 04/13/2014 (8) presented with T8 cord compression and underwent laminectomy 04/13/2014 with T8 decompression of spinal cord, posterior fixation from T6-T10 with pedicle screws and rods, posterior arthrodesis with allograft. 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 ExacTrac Snap verification was performed for each couch angle.   (b) radiation to the T-spine completed 05/08/2014.  (c) [facility]  (11) started fulvestrant 05/18/2014 and Palbociclib 06/01/2014  (a) Palbociclib dose decreased to 100 mg daily, 21/7 as of 08/05/2014  (12) started zolendronate 05/18/2014, repeated every 12 weeks  PLAN: [place] doing well today. Her brain MRI showed decreased enhancement and decreased edema so she was excited to hear that. The labs were reviewed in detail and her [place] normal at 1.7, which is good for the last week in this cycle of palbociclib. She will proceed with her next fulvestrant injection as planned today. We will continue on both drugs until there is evidence of disease progression.   For her joint aches, she will try naproxen BID with food and light stretching exercises in the morning.   WeKerrynill return for monthly visits and fulvestrant injections. We anticipate her next round of restaging scans in May. She understands and agrees with this plan. She knows the goal of treatment in her case is control. She has been encouraged to call with any issues that might arise before her next visit here.    HeLaurie PandaNP   03/08/2015 10:29 AM

## 2015-03-16 MED FILL — *IBRANCE 100 MG CAPSULE: 100 | 28 days supply | Qty: 21 | Fill #3

## 2015-03-26 NOTE — Telephone Encounter (Signed)
Opened in error

## 2015-04-05 ENCOUNTER — Other Ambulatory Visit (HOSPITAL_BASED_OUTPATIENT_CLINIC_OR_DEPARTMENT_OTHER): Payer: BC Managed Care – PPO

## 2015-04-05 ENCOUNTER — Ambulatory Visit (HOSPITAL_BASED_OUTPATIENT_CLINIC_OR_DEPARTMENT_OTHER): Payer: BC Managed Care – PPO

## 2015-04-05 ENCOUNTER — Ambulatory Visit (HOSPITAL_BASED_OUTPATIENT_CLINIC_OR_DEPARTMENT_OTHER): Payer: BC Managed Care – PPO | Admitting: Nurse Practitioner

## 2015-04-05 ENCOUNTER — Telehealth: Payer: Self-pay | Admitting: Oncology

## 2015-04-05 ENCOUNTER — Encounter: Payer: Self-pay | Admitting: Nurse Practitioner

## 2015-04-05 VITALS — BP 108/62 | HR 94 | Temp 98.1°F | Resp 18 | Ht 68.0 in | Wt 128.0 lb

## 2015-04-05 DIAGNOSIS — C7951 Secondary malignant neoplasm of bone: Secondary | ICD-10-CM

## 2015-04-05 DIAGNOSIS — C7931 Secondary malignant neoplasm of brain: Principal | ICD-10-CM

## 2015-04-05 DIAGNOSIS — C50911 Malignant neoplasm of unspecified site of right female breast: Secondary | ICD-10-CM

## 2015-04-05 DIAGNOSIS — Z170 Estrogen receptor positive status [ER+]: Secondary | ICD-10-CM

## 2015-04-05 DIAGNOSIS — Z5111 Encounter for antineoplastic chemotherapy: Secondary | ICD-10-CM

## 2015-04-05 DIAGNOSIS — C773 Secondary and unspecified malignant neoplasm of axilla and upper limb lymph nodes: Secondary | ICD-10-CM | POA: Diagnosis not present

## 2015-04-05 DIAGNOSIS — C50411 Malignant neoplasm of upper-outer quadrant of right female breast: Secondary | ICD-10-CM | POA: Diagnosis not present

## 2015-04-05 DIAGNOSIS — C50919 Malignant neoplasm of unspecified site of unspecified female breast: Secondary | ICD-10-CM

## 2015-04-05 LAB — CBC WITH DIFFERENTIAL/PLATELET
BASO%: 0.9 % (ref 0.0–2.0)
Basophils Absolute: 0 10*3/uL (ref 0.0–0.1)
EOS ABS: 0.1 10*3/uL (ref 0.0–0.5)
EOS%: 2.7 % (ref 0.0–7.0)
HEMATOCRIT: 35.3 % (ref 34.8–46.6)
HGB: 11.9 g/dL (ref 11.6–15.9)
LYMPH#: 0.7 10*3/uL — AB (ref 0.9–3.3)
LYMPH%: 34.2 % (ref 14.0–49.7)
MCH: 33 pg (ref 25.1–34.0)
MCHC: 33.8 g/dL (ref 31.5–36.0)
MCV: 97.7 fL (ref 79.5–101.0)
MONO#: 0.1 10*3/uL (ref 0.1–0.9)
MONO%: 5 % (ref 0.0–14.0)
NEUT%: 57.2 % (ref 38.4–76.8)
NEUTROS ABS: 1.2 10*3/uL — AB (ref 1.5–6.5)
PLATELETS: 147 10*3/uL (ref 145–400)
RBC: 3.61 10*6/uL — ABNORMAL LOW (ref 3.70–5.45)
RDW: 13.4 % (ref 11.2–14.5)
WBC: 2 10*3/uL — ABNORMAL LOW (ref 3.9–10.3)

## 2015-04-05 LAB — COMPREHENSIVE METABOLIC PANEL
ALT: 10 U/L (ref 0–55)
ANION GAP: 8 meq/L (ref 3–11)
AST: 16 U/L (ref 5–34)
Albumin: 3.8 g/dL (ref 3.5–5.0)
Alkaline Phosphatase: 88 U/L (ref 40–150)
BUN: 14.6 mg/dL (ref 7.0–26.0)
CALCIUM: 9.4 mg/dL (ref 8.4–10.4)
CO2: 30 mEq/L — ABNORMAL HIGH (ref 22–29)
CREATININE: 0.9 mg/dL (ref 0.6–1.1)
Chloride: 107 mEq/L (ref 98–109)
EGFR: 87 mL/min/{1.73_m2} — AB (ref >90)
Glucose: 91 mg/dl (ref 70–140)
Potassium: 4.2 mEq/L (ref 3.5–5.1)
Sodium: 145 mEq/L (ref 136–145)
TOTAL PROTEIN: 7.2 g/dL (ref 6.4–8.3)

## 2015-04-05 MED ORDER — FULVESTRANT 250 MG/5ML IM SOLN
500.0000 mg | Freq: Once | INTRAMUSCULAR | Status: AC
  Administered 2015-04-05: 500 mg via INTRAMUSCULAR
  Filled 2015-04-05: qty 10

## 2015-04-05 NOTE — Telephone Encounter (Signed)
No pof sent for today's visit. Patient given avs report and appointments for existing lab/fu/inj 4/26. Central radiology scheduling will call patient re ct-scan - patient aware.

## 2015-04-05 NOTE — Progress Notes (Signed)
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 female. Without significant past medical history. She underwent a mammogram that showed calcifications measuring 5 cm on the right breast. She then went on To have an ultrasound which showed the area to measure 2.9 cm. She was also found to have a right axillary lymph node that was suspicious for a malignancy. She had a biopsy of the calcifications that showed high-grade ductal carcinoma in situ. 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 In the lymph node biopsy there was no lymphatic tissue seen and was felt that the node was replaced by tumor. Patient went on to have an MRI of the bilateral breasts performed on evening of 05/30/2011. The MRI showed in the right breast 5 x 3 x 4.5 cm mass abutting the chest wall. About 7 cm away from this there was another mass in the upper inner quadrant that measured 1.5 x 1.7 x 1.5 cm. On the contralateral breast, that is the left breast a 2 cm area suspicious enhancement was noted also this up to date has not been biopsied and arrangements are being made for the biopsy to be performed. In this side there were no suspicious lymph nodes. The prognostic panel is pending. Patient is otherwise without any complaints. "  METASTATIC DISEASE: From the earlier  summary note:  Carolyn [name] noted some strange feelings around her umbilicus 65/68/1275. This felt like an area of numbness. Over the next 2 days she noted some leg weakness and difficulty walking, so she presented to the ED 04/12/2014. MRI of the thoraco-lumbar spine was obtained 04/12/2014 showing multiple bone lesions and compression fracture at T8 with retropulsion and cord compression. On 04/13/2014 she underwent Laminectomy T8 decompression of spinal cord posterior fixation from T6-T10 with pedicle screws and rods posterior arthrodesis with allograft. The pathology from this procedure ([id number] [id number]) showed metastatic adenocarcinoma which was estrogen receptor 69% positive, with moderate staining intensity, progesterone receptor negative. HER-2 could not be obtained.  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 There were nonspecific L temporal bone changes and also possible involvement of the clivus and calvarium, but no other parenchymal brain lesions. Further staging studies included a bone scan, which failed to show the lytic lesion seen on other scans, and CTs of the chest, abdomen and pelvis on 06/08/2011, which showed very small right lung and left liver lesions which will require follow-up  Her subsequent history is as detailed below  INTERVAL HISTORY: Carolyn [name] returns today for follow up of her metastatic breast cancer. She is due for her next fulvestrant injection today. Her hot flashes are improving. She still has joint aches and stiffness but never did try naproxen for this discomfort. She continues on 181m palbociclib, 21 days on and 7 days off.  She will finish up her  3 weeks on this Friday. She tolerates these drug well with minor side effects. She has mild fatigue, daily hot flashes, and joint aches. The interval history is generally unremarkable.  REVIEW OF SYSTEMS: Carolyn [name] denies fevers,  chills, nausea, vomiting, or changes in bowel or bladder habits. The tingling to her right hand continue to improve. She walks well with a cane and has not had any falls. She denies shortness of breath, chest pain, cough, or palpitations. She has no headaches, dizziness, or vision changes. A detailed review of systems is otherwise stable.  PAST MEDICAL HISTORY: Past Medical History  Diagnosis Date  . Breast cancer ([facility]) 09/2011    invasive ductal carcinoma metastatic ca in 3/14 lymph nodes  . Hx of radiation therapy 11/08/11 -12/26/11    right chest wall/supraclav fossa, right scar  . History of chemotherapy 06/27/11 -09/04/11    neoadjuvant  . S/P radiation therapy 05/08/14    SRS brain    PAST SURGICAL HISTORY: Past Surgical History  Procedure Laterality Date  . Wisdom tooth extraction    . Portacath placement  06/20/2011    Procedure: INSERTION PORT-A-CATH;  Surgeon: Haywood Lasso, MD;  Location: [facility];  Service: General;  Laterality: N/A;  . Modified mastectomy  10/03/2011    Procedure: MODIFIED MASTECTOMY;  Surgeon: Haywood Lasso, MD;  Location: [facility];  Service: General;  Laterality: Right;  . Port-a-cath removal  01/30/2012    Procedure: MINOR REMOVAL PORT-A-CATH;  Surgeon: Haywood Lasso, MD;  Location: [facility];  Service: General;  Laterality: Left;  . Breast biopsy      Left  . Robotic assisted total hysterectomy with bilateral salpingo oopherectomy Bilateral 12/23/2012    Procedure: ROBOTIC ASSISTED TOTAL HYSTERECTOMY WITH BILATERAL SALPINGO OOPHORECTOMY;  Surgeon: Marvene Staff, MD;  Location: [facility];  Service: Gynecology;  Laterality: Bilateral;  . Cholecystectomy N/A 03/01/2014    Procedure: LAPAROSCOPIC CHOLECYSTECTOMY;  Surgeon: Leighton Ruff, MD;  Location: [facility];  Service: General;  Laterality: N/A;  . Laminectomy N/A 04/13/2014    Procedure: THORACIC LAMINECTOMY WITH FIXATION THORACIC SIX-THORACIC TEN FUSION;  Surgeon:  Kristeen Miss, MD;  Location: [facility];  Service: Neurosurgery;  Laterality: N/A;  . Brain surgery-litt Left 11/20/14    LITT procedure for Lt brain met.    FAMILY HISTORY Family History  Problem Relation Age of Onset  . Breast cancer Maternal Aunt 11  . Pancreatic cancer Maternal Grandfather   . Pancreatic cancer Maternal Aunt     died in her 40s  . Leukemia Maternal Aunt     died in her 91s  . Ovarian cancer Cousin 85    maternal cousin   the patient's father is alive, currently 40 years old. The patient's mother died from complications of diabetes at the age of 49. The patient had no brothers, 4 sisters. One sister has died from congestive heart failure. The patient's mother is one of 5 sisters. One of them was diagnosed with breast cancer the age of 53. Also a cousin on the maternal side it was diagnosed with ovarian cancer. This was approximately age 20. There is also colon cancer in the family. The patient has had extensive genetic testing summarized below. She is however BRCA negative  GYNECOLOGIC HISTORY:  Patient's last menstrual period was 07/02/2011. Menarche age 37, she is GX P0. She underwent total hysterectomy with bilateral salpingo-oophorectomy December 2014.  SOCIAL HISTORY:  Carolyn [name] works at [employer] for [employer]. She mostly deals  with government documents. She is single, lives by herself, with no pets. She attends [employer] locally.    ADVANCED DIRECTIVES: Not in place. The patient has a documents and intends to name her sister Shakaya Bhullar as healthcare power of attorney. Joelene Millin lives in [place] and can be reached at [place]: Social History  Substance Use Topics  . Smoking status: Never Smoker   . Smokeless tobacco: Never Used  . Alcohol Use: No     Colonoscopy:  PAP:  Bone density:  Lipid panel:  Allergies  Allergen Reactions  . Allegra [Fexofenadine] Hives    Abdomen only  . Shellfish Allergy Hives    Abdomen only      Current Outpatient Prescriptions  Medication Sig Dispense Refill  . acetaminophen (TYLENOL) 325 MG tablet Take 2 tablets (650 mg total) by mouth every 6 (six) hours as needed for mild pain, moderate pain, fever or headache.    . Calcium Carbonate-Vit D-Min (CALCIUM 1200 PO) Take 1 tablet by mouth 2 (two) times daily.    . Cholecalciferol (VITAMIN D3) 2000 UNITS TABS Take 2,000 Units by mouth daily.    . Multiple Vitamin (MULTIVITAMIN) tablet Take 1 tablet by mouth daily.    . palbociclib (IBRANCE) 100 MG capsule TAKE 1 CAPSULE BY MOUTH DAILY WITH BREAKFAST. TAKE WHOLE WITH FOOD. TAKE DAILY FOR 21 DAYS AND THEN OFF FOR 7 DAYS. 21 capsule 3   No current facility-administered medications for this visit.   Facility-Administered Medications Ordered in Other Visits  Medication Dose Route Frequency Provider Last Rate Last Dose  . fulvestrant (FASLODEX) injection 500 mg  500 mg Intramuscular Once Chauncey Cruel, MD        OBJECTIVE: Middle-aged African American woman  In no acute distress Filed Vitals:   04/05/15 1138  BP: 108/62  Pulse: 94  Temp: 98.1 F (36.7 C)  Resp: 18     Body mass index is 19.47 kg/(m^2).    ECOG FS:1 - Symptomatic but completely ambulatory  Skin: warm, dry  HEENT: sclerae anicteric, conjunctivae pink, oropharynx clear. No thrush or mucositis.  Lymph Nodes: No cervical or supraclavicular lymphadenopathy  Lungs: clear to auscultation bilaterally, no rales, wheezes, or rhonci  Heart: regular rate and rhythm  Abdomen: round, soft, non tender, positive bowel sounds  Musculoskeletal: No focal spinal tenderness, no peripheral edema  Neuro: non focal, well oriented, positive affect  Breasts: deferred  LAB RESULTS:  CMP     Component Value Date/Time   NA 145 04/05/2015 1118   NA 141 04/21/2014 0910   K 4.2 04/05/2015 1118   K 3.6 04/21/2014 0910   CL 103 04/21/2014 0910   CL 102 01/16/2012 0804   CO2 30* 04/05/2015 1118   CO2 27 04/21/2014 0910    GLUCOSE 91 04/05/2015 1118   GLUCOSE 133* 04/21/2014 0910   GLUCOSE 92 01/16/2012 0804   BUN 14.6 04/05/2015 1118   BUN 9 04/21/2014 0910   CREATININE 0.9 04/05/2015 1118   CREATININE 0.77 04/21/2014 0910   CALCIUM 9.4 04/05/2015 1118   CALCIUM 9.5 04/21/2014 0910   PROT 7.2 04/05/2015 1118   PROT 6.3 04/21/2014 0910   ALBUMIN 3.8 04/05/2015 1118   ALBUMIN 3.1* 04/21/2014 0910   AST 16 04/05/2015 1118   AST 26 04/21/2014 0910   ALT 10 04/05/2015 1118   ALT 19 04/21/2014 0910   ALKPHOS 88 04/05/2015 1118   ALKPHOS 99 04/21/2014 0910   BILITOT <0.30 04/05/2015 1118  BILITOT 0.5 04/21/2014 0910   GFRNONAA >90 04/21/2014 0910   GFRAA >90 04/21/2014 0910    I No results found for: SPEP  Lab Results  Component Value Date   WBC 2.0* 04/05/2015   NEUTROABS 1.2* 04/05/2015   HGB 11.9 04/05/2015   HCT 35.3 04/05/2015   MCV 97.7 04/05/2015   PLT 147 04/05/2015      Chemistry      Component Value Date/Time   NA 145 04/05/2015 1118   NA 141 04/21/2014 0910   K 4.2 04/05/2015 1118   K 3.6 04/21/2014 0910   CL 103 04/21/2014 0910   CL 102 01/16/2012 0804   CO2 30* 04/05/2015 1118   CO2 27 04/21/2014 0910   BUN 14.6 04/05/2015 1118   BUN 9 04/21/2014 0910   CREATININE 0.9 04/05/2015 1118   CREATININE 0.77 04/21/2014 0910      Component Value Date/Time   CALCIUM 9.4 04/05/2015 1118   CALCIUM 9.5 04/21/2014 0910   ALKPHOS 88 04/05/2015 1118   ALKPHOS 99 04/21/2014 0910   AST 16 04/05/2015 1118   AST 26 04/21/2014 0910   ALT 10 04/05/2015 1118   ALT 19 04/21/2014 0910   BILITOT <0.30 04/05/2015 1118   BILITOT 0.5 04/21/2014 0910       Lab Results  Component Value Date   LABCA2 24 05/31/2011    No components found for: [id number]  No results for input(s): INR in the last 168 hours.  Urinalysis No results found for: COLORURINE  STUDIES: No results found.  ASSESSMENT: 45 y.o. BRCA negative [place] woman status post right breast upper outer quadrant and  right axillary lymph node biopsy 05/18/2011, both positive for an invasive ductal carcinoma, high-grade, clinicallyT2 N1-2 or stage IIB/IIIA,  estrogen receptor 100% positive, progesterone receptor 87% positive, with an MIB-1 of 14% and no HER-2 amplification ([id number]).  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   (3)Treated neoadjuvantly with cyclophosphamide and docetaxel x4 completed 08/29/2011.  (4) status post right modified radical mastectomy 10/03/2011 showing a residual pT1c pN1a (3/18 lymph nodes positive) invasive ductal carcinoma, grade 1,estrogen receptor 100% positive, progesterone receptor negative, with no HER-2 amplification ([id number])  (5) adjuvant radiation to the right chest wall, right supraclavicular fossa and right scar completed 12/26/2011  (6) tamoxifen started January 2014-- discontinued April 2016 with evidence of metastatic disease  (7) status post total hysterectomy with bilateral salpingo-oophorectomy 12/23/2012 with benign pathology ([id number])  METASTATIC DISEASE 04/13/2014 (8) presented with T8 cord compression and underwent laminectomy 04/13/2014 with T8 decompression of spinal cord, posterior fixation from T6-T10 with pedicle screws and rods, posterior arthrodesis with allograft. 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 ExacTrac Snap verification was performed for each couch angle.   (b) radiation to the T-spine completed  05/08/2014.  (c) "Auto-LITT" procedure performed at [facility] on 11/20/14  (11) started fulvestrant 05/18/2014 and Palbociclib 06/01/2014  (a) Palbociclib dose decreased to 100 mg daily, 21/7 as of 08/05/2014  (12) started zolendronate 05/18/2014, repeated every 12 weeks  PLAN: Carolyn [name] continues to tolerate treatment well. Her ANC is 1.2 today, which will be enough to carry her to the end of this cycle on Friday. She will proceed with her next fulvestrant injection this afternoon. We will continue on both drugs until there is evidence of disease progression.   Carolyn [name] will be due for her next dose of zometa in 4 weeks. That day she also will have a follow up visit with Dr. [name] to review the scans performed the week before. She understands and agrees with this plan. She knows the goal of treatment in her case is control. She has been encouraged to call with any issues that might arise before her next visit here.    Laurie Panda, NP   04/05/2015 12:08 PM

## 2015-04-08 ENCOUNTER — Other Ambulatory Visit: Payer: Self-pay | Admitting: *Deleted

## 2015-04-16 MED FILL — *IBRANCE 100 MG CAPSULE: 100 | 28 days supply | Qty: 21 | Fill #0

## 2015-04-28 ENCOUNTER — Other Ambulatory Visit (HOSPITAL_BASED_OUTPATIENT_CLINIC_OR_DEPARTMENT_OTHER): Payer: BC Managed Care – PPO

## 2015-04-28 ENCOUNTER — Other Ambulatory Visit (HOSPITAL_COMMUNITY)
Admission: RE | Admit: 2015-04-28 | Discharge: 2015-04-28 | Disposition: A | Discharge status: Home / Self Care | Payer: BC Managed Care – PPO | Source: Ambulatory Visit | Attending: General Practice | Admitting: General Practice

## 2015-04-28 ENCOUNTER — Other Ambulatory Visit: Payer: Self-pay

## 2015-04-28 DIAGNOSIS — C50411 Malignant neoplasm of upper-outer quadrant of right female breast: Secondary | ICD-10-CM | POA: Diagnosis not present

## 2015-04-28 LAB — CBC WITH DIFFERENTIAL/PLATELET
BASO%: 0.7 % (ref 0.0–2.0)
Basophils Absolute: 0 10*3/uL (ref 0.0–0.1)
EOS%: 3.7 % (ref 0.0–7.0)
Eosinophils Absolute: 0.1 10*3/uL (ref 0.0–0.5)
HCT: 34.7 % — ABNORMAL LOW (ref 34.8–46.6)
HGB: 12.1 g/dL (ref 11.6–15.9)
LYMPH#: 0.7 10*3/uL — AB (ref 0.9–3.3)
LYMPH%: 24.7 % (ref 14.0–49.7)
MCH: 33.4 pg (ref 25.1–34.0)
MCHC: 34.9 g/dL (ref 31.5–36.0)
MCV: 95.9 fL (ref 79.5–101.0)
MONO#: 0.1 10*3/uL (ref 0.1–0.9)
MONO%: 4.1 % (ref 0.0–14.0)
NEUT#: 1.8 10*3/uL (ref 1.5–6.5)
NEUT%: 66.8 % (ref 38.4–76.8)
Platelets: 191 10*3/uL (ref 145–400)
RBC: 3.62 10*6/uL — AB (ref 3.70–5.45)
RDW: 13 % (ref 11.2–14.5)
WBC: 2.7 10*3/uL — ABNORMAL LOW (ref 3.9–10.3)

## 2015-04-28 LAB — COMPREHENSIVE METABOLIC PANEL
ALT: 12 U/L (ref 0–55)
AST: 17 U/L (ref 5–34)
Albumin: 3.9 g/dL (ref 3.5–5.0)
Alkaline Phosphatase: 94 U/L (ref 40–150)
Anion Gap: 8 mEq/L (ref 3–11)
BUN: 15 mg/dL (ref 7.0–26.0)
CHLORIDE: 109 meq/L (ref 98–109)
CO2: 26 meq/L (ref 22–29)
CREATININE: 0.9 mg/dL (ref 0.6–1.1)
Calcium: 9.5 mg/dL (ref 8.4–10.4)
EGFR: >90 mL/min/{1.73_m2} (ref >90)
Glucose: 82 mg/dl (ref 70–140)
Potassium: 3.8 mEq/L (ref 3.5–5.1)
SODIUM: 143 meq/L (ref 136–145)
Total Bilirubin: 0.32 mg/dL (ref 0.20–1.20)
Total Protein: 7.3 g/dL (ref 6.4–8.3)

## 2015-04-29 MED FILL — OXYCODONE/APAP 5/325MG: 5-325 | 6 days supply | Qty: 24 | Fill #0

## 2015-04-29 MED FILL — AMOXICILLIN 500 MG CAPSULE: 500 | 7 days supply | Qty: 21 | Fill #0

## 2015-04-29 MED FILL — IBUPROFEN 800 MG TABLET: 800 | 7 days supply | Qty: 21 | Fill #0

## 2015-05-03 ENCOUNTER — Encounter (HOSPITAL_COMMUNITY): Payer: Self-pay

## 2015-05-03 ENCOUNTER — Ambulatory Visit (HOSPITAL_COMMUNITY)
Admission: RE | Admit: 2015-05-03 | Discharge: 2015-05-03 | Disposition: A | Discharge status: Home / Self Care | Payer: BC Managed Care – PPO | Source: Ambulatory Visit | Attending: Nurse Practitioner | Admitting: Nurse Practitioner

## 2015-05-03 ENCOUNTER — Encounter (HOSPITAL_COMMUNITY)
Admission: RE | Admit: 2015-05-03 | Discharge: 2015-05-03 | Disposition: A | Discharge status: Home / Self Care | Payer: BC Managed Care – PPO | Source: Ambulatory Visit | Attending: Nurse Practitioner | Admitting: Nurse Practitioner

## 2015-05-03 DIAGNOSIS — Z9011 Acquired absence of right breast and nipple: Secondary | ICD-10-CM | POA: Insufficient documentation

## 2015-05-03 DIAGNOSIS — Z981 Arthrodesis status: Secondary | ICD-10-CM | POA: Insufficient documentation

## 2015-05-03 DIAGNOSIS — C50411 Malignant neoplasm of upper-outer quadrant of right female breast: Secondary | ICD-10-CM

## 2015-05-03 DIAGNOSIS — R918 Other nonspecific abnormal finding of lung field: Secondary | ICD-10-CM | POA: Diagnosis not present

## 2015-05-03 DIAGNOSIS — C7951 Secondary malignant neoplasm of bone: Secondary | ICD-10-CM | POA: Diagnosis not present

## 2015-05-03 MED ORDER — TECHNETIUM TC 99M MEDRONATE IV KIT: 26.0000 | PACK | Freq: Once | INTRAVENOUS | Status: DC | PRN

## 2015-05-03 MED ORDER — IOPAMIDOL (ISOVUE-300) INJECTION 61%
75.0000 mL | Freq: Once | INTRAVENOUS | Status: AC | PRN
  Administered 2015-05-03: 75 mL via INTRAVENOUS

## 2015-05-03 MED ORDER — TECHNETIUM TC 99M MEDRONATE IV KIT
25.0000 | PACK | Freq: Once | INTRAVENOUS | Status: AC | PRN
  Administered 2015-05-03: 25 via INTRAVENOUS

## 2015-05-05 ENCOUNTER — Ambulatory Visit (HOSPITAL_BASED_OUTPATIENT_CLINIC_OR_DEPARTMENT_OTHER): Payer: BC Managed Care – PPO | Admitting: Oncology

## 2015-05-05 ENCOUNTER — Ambulatory Visit (HOSPITAL_BASED_OUTPATIENT_CLINIC_OR_DEPARTMENT_OTHER): Payer: BC Managed Care – PPO

## 2015-05-05 ENCOUNTER — Ambulatory Visit: Payer: BC Managed Care – PPO

## 2015-05-05 ENCOUNTER — Other Ambulatory Visit (HOSPITAL_BASED_OUTPATIENT_CLINIC_OR_DEPARTMENT_OTHER): Payer: BC Managed Care – PPO

## 2015-05-05 ENCOUNTER — Telehealth: Payer: Self-pay | Admitting: Oncology

## 2015-05-05 VITALS — BP 116/73 | HR 89 | Temp 98.5°F | Resp 18 | Ht 68.0 in | Wt 124.9 lb

## 2015-05-05 DIAGNOSIS — C7951 Secondary malignant neoplasm of bone: Secondary | ICD-10-CM

## 2015-05-05 DIAGNOSIS — C50911 Malignant neoplasm of unspecified site of right female breast: Secondary | ICD-10-CM

## 2015-05-05 DIAGNOSIS — C773 Secondary and unspecified malignant neoplasm of axilla and upper limb lymph nodes: Secondary | ICD-10-CM | POA: Diagnosis not present

## 2015-05-05 DIAGNOSIS — D709 Neutropenia, unspecified: Secondary | ICD-10-CM

## 2015-05-05 DIAGNOSIS — C50411 Malignant neoplasm of upper-outer quadrant of right female breast: Secondary | ICD-10-CM

## 2015-05-05 DIAGNOSIS — Z5111 Encounter for antineoplastic chemotherapy: Secondary | ICD-10-CM

## 2015-05-05 DIAGNOSIS — C7931 Secondary malignant neoplasm of brain: Principal | ICD-10-CM

## 2015-05-05 DIAGNOSIS — C50919 Malignant neoplasm of unspecified site of unspecified female breast: Secondary | ICD-10-CM

## 2015-05-05 LAB — CBC WITH DIFFERENTIAL/PLATELET
BASO%: 0.8 % (ref 0.0–2.0)
Basophils Absolute: 0 10*3/uL (ref 0.0–0.1)
EOS ABS: 0.1 10*3/uL (ref 0.0–0.5)
EOS%: 4.8 % (ref 0.0–7.0)
HCT: 33.7 % — ABNORMAL LOW (ref 34.8–46.6)
HGB: 11.8 g/dL (ref 11.6–15.9)
LYMPH%: 42.9 % (ref 14.0–49.7)
MCH: 33.4 pg (ref 25.1–34.0)
MCHC: 35 g/dL (ref 31.5–36.0)
MCV: 95.5 fL (ref 79.5–101.0)
MONO#: 0.1 10*3/uL (ref 0.1–0.9)
MONO%: 6.3 % (ref 0.0–14.0)
NEUT%: 45.2 % (ref 38.4–76.8)
NEUTROS ABS: 0.6 10*3/uL — AB (ref 1.5–6.5)
PLATELETS: 130 10*3/uL — AB (ref 145–400)
RBC: 3.53 10*6/uL — AB (ref 3.70–5.45)
RDW: 12.9 % (ref 11.2–14.5)
WBC: 1.3 10*3/uL — AB (ref 3.9–10.3)
lymph#: 0.5 10*3/uL — ABNORMAL LOW (ref 0.9–3.3)

## 2015-05-05 LAB — COMPREHENSIVE METABOLIC PANEL
ALT: 12 U/L (ref 0–55)
ANION GAP: 11 meq/L (ref 3–11)
AST: 17 U/L (ref 5–34)
Albumin: 3.9 g/dL (ref 3.5–5.0)
Alkaline Phosphatase: 84 U/L (ref 40–150)
BILIRUBIN TOTAL: 0.34 mg/dL (ref 0.20–1.20)
BUN: 14.4 mg/dL (ref 7.0–26.0)
CHLORIDE: 107 meq/L (ref 98–109)
CO2: 26 meq/L (ref 22–29)
Calcium: 9.6 mg/dL (ref 8.4–10.4)
Creatinine: 1 mg/dL (ref 0.6–1.1)
EGFR: 79 mL/min/{1.73_m2} — AB (ref >90)
GLUCOSE: 100 mg/dL (ref 70–140)
POTASSIUM: 3.6 meq/L (ref 3.5–5.1)
SODIUM: 143 meq/L (ref 136–145)
Total Protein: 7.2 g/dL (ref 6.4–8.3)

## 2015-05-05 MED ORDER — ZOLEDRONIC ACID 4 MG/100ML IV SOLN
4.0000 mg | Freq: Once | INTRAVENOUS | Status: AC
  Administered 2015-05-05: 4 mg via INTRAVENOUS
  Filled 2015-05-05: qty 100

## 2015-05-05 MED ORDER — SODIUM CHLORIDE 0.9 % IV SOLN
Freq: Once | INTRAVENOUS | Status: AC
  Administered 2015-05-05: 09:00:00 via INTRAVENOUS

## 2015-05-05 MED ORDER — FULVESTRANT 250 MG/5ML IM SOLN
500.0000 mg | Freq: Once | INTRAMUSCULAR | Status: AC
  Administered 2015-05-05: 500 mg via INTRAMUSCULAR
  Filled 2015-05-05: qty 10

## 2015-05-05 MED ORDER — PALBOCICLIB 75 MG PO CAPS: ORAL_CAPSULE | ORAL | Status: DC

## 2015-05-05 MED FILL — *IBRANCE 75 MG CAPSULE: 75 | 21 days supply | Qty: 21 | Fill #0

## 2015-05-05 NOTE — Patient Instructions (Signed)
Zoledronic Acid injection (Hypercalcemia, Oncology)  What is this medicine?  ZOLEDRONIC ACID (ZOE le dron ik AS id) lowers the amount of calcium loss from bone. It is used to treat too much calcium in your blood from cancer. It is also used to prevent complications of cancer that has spread to the bone.  This medicine may be used for other purposes; ask your health care provider or pharmacist if you have questions.  What should I tell my health care provider before I take this medicine?  They need to know if you have any of these conditions:  -aspirin-sensitive asthma  -cancer, especially if you are receiving medicines used to treat cancer  -dental disease or wear dentures  -infection  -kidney disease  -receiving corticosteroids like dexamethasone or prednisone  -an unusual or allergic reaction to zoledronic acid, other medicines, foods, dyes, or preservatives  -pregnant or trying to get pregnant  -breast-feeding  How should I use this medicine?  This medicine is for infusion into a vein. It is given by a health care professional in a hospital or clinic setting.  Talk to your pediatrician regarding the use of this medicine in children. Special care may be needed.  Overdosage: If you think you have taken too much of this medicine contact a poison control center or emergency room at once.  NOTE: This medicine is only for you. Do not share this medicine with others.  What if I miss a dose?  It is important not to miss your dose. Call your doctor or health care professional if you are unable to keep an appointment.  What may interact with this medicine?  -certain antibiotics given by injection  -NSAIDs, medicines for pain and inflammation, like ibuprofen or naproxen  -some diuretics like bumetanide, furosemide  -teriparatide  -thalidomide  This list may not describe all possible interactions. Give your health care provider a list of all the medicines, herbs, non-prescription drugs, or dietary supplements you use. Also  tell them if you smoke, drink alcohol, or use illegal drugs. Some items may interact with your medicine.  What should I watch for while using this medicine?  Visit your doctor or health care professional for regular checkups. It may be some time before you see the benefit from this medicine. Do not stop taking your medicine unless your doctor tells you to. Your doctor may order blood tests or other tests to see how you are doing.  Women should inform their doctor if they wish to become pregnant or think they might be pregnant. There is a potential for serious side effects to an unborn child. Talk to your health care professional or pharmacist for more information.  You should make sure that you get enough calcium and vitamin D while you are taking this medicine. Discuss the foods you eat and the vitamins you take with your health care professional.  Some people who take this medicine have severe bone, joint, and/or muscle pain. This medicine may also increase your risk for jaw problems or a broken thigh bone. Tell your doctor right away if you have severe pain in your jaw, bones, joints, or muscles. Tell your doctor if you have any pain that does not go away or that gets worse.  Tell your dentist and dental surgeon that you are taking this medicine. You should not have major dental surgery while on this medicine. See your dentist to have a dental exam and fix any dental problems before starting this medicine. Take good care   your doctor or health care professional as soon as possible: -allergic reactions like skin rash, itching or hives, swelling of the face, lips, or tongue -anxiety, confusion, or depression -breathing problems -changes in vision -eye pain -feeling faint or lightheaded, falls -jaw pain,  especially after dental work -mouth sores -muscle cramps, stiffness, or weakness -redness, blistering, peeling or loosening of the skin, including inside the mouth -trouble passing urine or change in the amount of urine Side effects that usually do not require medical attention (report to your doctor or health care professional if they continue or are bothersome): -bone, joint, or muscle pain -constipation -diarrhea -fever -hair loss -irritation at site where injected -loss of appetite -nausea, vomiting -stomach upset -trouble sleeping -trouble swallowing -weak or tired This list may not describe all possible side effects. Call your doctor for medical advice about side effects. You may report side effects to FDA at 1-800-FDA-1088. Where should I keep my medicine? This drug is given in a hospital or clinic and will not be stored at home. NOTE: This sheet is a summary. It may not cover all possible information. If you have questions about this medicine, talk to your doctor, pharmacist, or health care provider.    2016, Elsevier/Gold Standard. (2013-05-24 14:19:39)    Fulvestrant injection What is this medicine? FULVESTRANT (ful VES trant) blocks the effects of estrogen. It is used to treat breast cancer. This medicine may be used for other purposes; ask your health care provider or pharmacist if you have questions. What should I tell my health care provider before I take this medicine? They need to know if you have any of these conditions: -bleeding problems -liver disease -low levels of platelets in the blood -an unusual or allergic reaction to fulvestrant, other medicines, foods, dyes, or preservatives -pregnant or trying to get pregnant -breast-feeding How should I use this medicine? This medicine is for injection into a muscle. It is usually given by a health care professional in a hospital or clinic setting. Talk to your pediatrician regarding the use of this medicine in  children. Special care may be needed. Overdosage: If you think you have taken too much of this medicine contact a poison control center or emergency room at once. NOTE: This medicine is only for you. Do not share this medicine with others. What if I miss a dose? It is important not to miss your dose. Call your doctor or health care professional if you are unable to keep an appointment. What may interact with this medicine? -medicines that treat or prevent blood clots like warfarin, enoxaparin, and dalteparin This list may not describe all possible interactions. Give your health care provider a list of all the medicines, herbs, non-prescription drugs, or dietary supplements you use. Also tell them if you smoke, drink alcohol, or use illegal drugs. Some items may interact with your medicine. What should I watch for while using this medicine? Your condition will be monitored carefully while you are receiving this medicine. You will need important blood work done while you are taking this medicine. Do not become pregnant while taking this medicine or for at least 1 year after stopping it. Women of child-bearing potential will need to have a negative pregnancy test before starting this medicine. Women should inform their doctor if they wish to become pregnant or think they might be pregnant. There is a potential for serious side effects to an unborn child. Men should inform their doctors if they wish to father a child. This medicine  may lower sperm counts. Talk to your health care professional or pharmacist for more information. Do not breast-feed an infant while taking this medicine or for 1 year after the last dose. What side effects may I notice from receiving this medicine? Side effects that you should report to your doctor or health care professional as soon as possible: -allergic reactions like skin rash, itching or hives, swelling of the face, lips, or tongue -feeling faint or lightheaded,  falls -pain, tingling, numbness, or weakness in the legs -signs and symptoms of infection like fever or chills; cough; flu-like symptoms; sore throat -vaginal bleeding Side effects that usually do not require medical attention (report to your doctor or health care professional if they continue or are bothersome): -aches, pains -constipation -diarrhea -headache -hot flashes -nausea, vomiting -pain at site where injected -stomach pain This list may not describe all possible side effects. Call your doctor for medical advice about side effects. You may report side effects to FDA at 1-800-FDA-1088. Where should I keep my medicine? This drug is given in a hospital or clinic and will not be stored at home. NOTE: This sheet is a summary. It may not cover all possible information. If you have questions about this medicine, talk to your doctor, pharmacist, or health care provider.    2016, Elsevier/Gold Standard. (2014-07-24 11:03:55)

## 2015-05-05 NOTE — Progress Notes (Signed)
Faslodex injection given by infusion nurse after chemotherapy treatment

## 2015-05-05 NOTE — Telephone Encounter (Signed)
appt made and avs will print in treatment room. Staff message sent to override GM 7/17 sch for 815 am

## 2015-05-05 NOTE — Progress Notes (Signed)
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 female. Without significant past medical history. She underwent a mammogram that showed calcifications measuring 5 cm on the right breast. She then went on To have an ultrasound which showed the area to measure 2.9 cm. She was also found to have a right axillary lymph node that was suspicious for a malignancy. She had a biopsy of the calcifications that showed high-grade ductal carcinoma in situ. 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 In the lymph node biopsy there was no lymphatic tissue seen and was felt that the node was replaced by tumor. Patient went on to have an MRI of the bilateral breasts performed on evening of 05/30/2011. The MRI showed in the right breast 5 x 3 x 4.5 cm mass abutting the chest wall. About 7 cm away from this there was another mass in the upper inner quadrant that measured 1.5 x 1.7 x 1.5 cm. On the contralateral breast, that is the left breast a 2 cm area suspicious enhancement was noted also this up to date has not been biopsied and arrangements are being made for the biopsy to be performed. In this side there were no suspicious lymph nodes. The prognostic panel is pending. Patient is otherwise without any complaints. "  METASTATIC DISEASE: From the earlier  summary note:  Carolyn [name] noted some strange feelings around her umbilicus 20/80/2233. This felt like an area of numbness. Over the next 2 days she noted some leg weakness and difficulty walking, so she presented to the ED 04/12/2014. MRI of the thoraco-lumbar spine was obtained 04/12/2014 showing multiple bone lesions and compression fracture at T8 with retropulsion and cord compression. On 04/13/2014 she underwent Laminectomy T8 decompression of spinal cord posterior fixation from T6-T10 with pedicle screws and rods posterior arthrodesis with allograft. The pathology from this procedure ([id number] [id number]) showed metastatic adenocarcinoma which was estrogen receptor 69% positive, with moderate staining intensity, progesterone receptor negative. HER-2 could not be obtained.  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 There were nonspecific L temporal bone changes and also possible involvement of the clivus and calvarium, but no other parenchymal brain lesions. Further staging studies included a bone scan, which failed to show the lytic lesion seen on other scans, and CTs of the chest, abdomen and pelvis on 06/08/2011, which showed very small right lung and left liver lesions which will require follow-up  Her subsequent history is as detailed below  INTERVAL HISTORY: Carolyn [name] returns today for follow up of her stage IV, estrogen receptor positive breast cancer. She continues on fulvestrant every 28 days, and tolerates that well, with mild discomfort from the shots but no other side effects that she is aware of. She is also on Iressa 100 mg daily 21 days on and 7 days off, currently  in her third week of the current cycle. She tolerates that well also, with no unusual fatigue, nausea, vomiting, rash, or change in bowel habits, or any other symptoms that she is aware of. Finally she receives zolendronate every 3 months.  She has no side effects from that treatment that she is aware of either  REVIEW OF SYSTEMS: Carolyn [name] continues to work full-time. She uses a cane with walking and tries to walk a minimum of 30 minutes every day. She also tries to be as active as possible during the day at work. She denies any unusual headaches, nausea, vomiting, dizziness, or gait imbalance. There have been no falls. There has been no fever or rash. She had a molar root left behind from a wisdom tooth extraction removed 04/29/2015, without event. A detailed review of systems today was otherwise entirely negative  PAST MEDICAL HISTORY: Past Medical History  Diagnosis Date  . Breast cancer ([place]) 09/2011    invasive ductal carcinoma metastatic ca in 3/14 lymph nodes  . Hx of radiation therapy 11/08/11 -12/26/11    right chest wall/supraclav fossa, right scar  . History of chemotherapy 06/27/11 -09/04/11    neoadjuvant  . S/P radiation therapy 05/08/14    SRS brain    PAST SURGICAL HISTORY: Past Surgical History  Procedure Laterality Date  . Wisdom tooth extraction    . Portacath placement  06/20/2011    Procedure: INSERTION PORT-A-CATH;  Surgeon: Haywood Lasso, MD;  Location: [facility];  Service: General;  Laterality: N/A;  . Modified mastectomy  10/03/2011    Procedure: MODIFIED MASTECTOMY;  Surgeon: Haywood Lasso, MD;  Location: [facility];  Service: General;  Laterality: Right;  . Port-a-cath removal  01/30/2012    Procedure: MINOR REMOVAL PORT-A-CATH;  Surgeon: Haywood Lasso, MD;  Location: [facility];  Service: General;  Laterality: Left;  . Breast biopsy      Left  . Robotic assisted total hysterectomy with bilateral salpingo oopherectomy Bilateral 12/23/2012    Procedure: ROBOTIC ASSISTED TOTAL HYSTERECTOMY WITH BILATERAL SALPINGO OOPHORECTOMY;  Surgeon: Marvene Staff, MD;  Location: [facility];  Service: Gynecology;  Laterality: Bilateral;  . Cholecystectomy N/A 03/01/2014     Procedure: LAPAROSCOPIC CHOLECYSTECTOMY;  Surgeon: Leighton Ruff, MD;  Location: [facility];  Service: General;  Laterality: N/A;  . Laminectomy N/A 04/13/2014    Procedure: THORACIC LAMINECTOMY WITH FIXATION THORACIC SIX-THORACIC TEN FUSION;  Surgeon: Kristeen Miss, MD;  Location: [facility];  Service: Neurosurgery;  Laterality: N/A;  . Brain surgery-litt Left 11/20/14    LITT procedure for Lt brain met.    FAMILY HISTORY Family History  Problem Relation Age of Onset  . Breast cancer Maternal Aunt 43  . Pancreatic cancer Maternal Grandfather   . Pancreatic cancer Maternal Aunt     died in her 90s  . Leukemia Maternal Aunt     died in her 80s  . Ovarian cancer Cousin 63    maternal cousin   the patient's father is alive, currently 42 years old. The patient's mother died from complications of diabetes at the age of 84. The patient had no brothers, 4 sisters. One sister has died from congestive heart failure. The patient's mother is one of 5 sisters. One of them was diagnosed with breast cancer the age of 41. Also a cousin on the maternal side it was diagnosed with ovarian cancer. This was approximately age 60. There is also colon cancer in the family. The patient has had  extensive genetic testing summarized below. She is however BRCA negative  GYNECOLOGIC HISTORY:  Patient's last menstrual period was 07/02/2011. Menarche age 64, she is GX P0. She underwent total hysterectomy with bilateral salpingo-oophorectomy December 2014.  SOCIAL HISTORY:  Aimy works at [employer] for [employer]. She mostly deals with government documents. She is single, lives by herself, with no pets. She attends [employer] locally.    ADVANCED DIRECTIVES: Not in place. The patient has a documents and intends to name her sister Ocie Stanzione as healthcare power of attorney. Joelene Millin lives in [place] and can be reached at [place]: Social History  Substance Use Topics  . Smoking status: Never  Smoker   . Smokeless tobacco: Never Used  . Alcohol Use: No     Colonoscopy:  PAP:  Bone density:  Lipid panel:  Allergies  Allergen Reactions  . Allegra [Fexofenadine] Hives    Abdomen only  . Shellfish Allergy Hives    Abdomen only    Current Outpatient Prescriptions  Medication Sig Dispense Refill  . acetaminophen (TYLENOL) 325 MG tablet Take 2 tablets (650 mg total) by mouth every 6 (six) hours as needed for mild pain, moderate pain, fever or headache.    . Calcium Carbonate-Vit D-Min (CALCIUM 1200 PO) Take 1 tablet by mouth 2 (two) times daily.    . Cholecalciferol (VITAMIN D3) 2000 UNITS TABS Take 2,000 Units by mouth daily.    . Multiple Vitamin (MULTIVITAMIN) tablet Take 1 tablet by mouth daily.    . palbociclib (IBRANCE) 75 MG capsule TAKE 1 CAPSULE BY MOUTH DAILY WITH BREAKFAST. TAKE WHOLE WITH FOOD. TAKE DAILY FOR 21 DAYS AND THEN OFF FOR 7 DAYS. 21 capsule 6   No current facility-administered medications for this visit.    OBJECTIVE: Middle-aged [place] American woman Who appears stated age 4 Vitals:   05/05/15 0827  BP: 116/73  Pulse: 89  Temp: 98.5 F (36.9 C)  Resp: 18     Body mass index is 19 kg/(m^2).    ECOG FS:1 - Symptomatic but completely ambulatory  Sclerae unicteric, pupils round and equal Oropharynx clear and moist-- no thrush or other lesions No cervical or supraclavicular adenopathy Lungs no rales or rhonchi Heart regular rate and rhythm Abd soft, nontender, positive bowel sounds MSK no focal spinal tenderness, no upper extremity lymphedema Neuro: Entirely nonfocal, well oriented, positive affect Breasts: deferred    LAB RESULTS:  CMP     Component Value Date/Time   NA 143 04/28/2015 0837   NA 141 04/21/2014 0910   K 3.8 04/28/2015 0837   K 3.6 04/21/2014 0910   CL 103 04/21/2014 0910   CL 102 01/16/2012 0804   CO2 26 04/28/2015 0837   CO2 27 04/21/2014 0910   GLUCOSE 82 04/28/2015 0837   GLUCOSE 133* 04/21/2014 0910    GLUCOSE 92 01/16/2012 0804   BUN 15.0 04/28/2015 0837   BUN 9 04/21/2014 0910   CREATININE 0.9 04/28/2015 0837   CREATININE 0.77 04/21/2014 0910   CALCIUM 9.5 04/28/2015 0837   CALCIUM 9.5 04/21/2014 0910   PROT 7.3 04/28/2015 0837   PROT 6.3 04/21/2014 0910   ALBUMIN 3.9 04/28/2015 0837   ALBUMIN 3.1* 04/21/2014 0910   AST 17 04/28/2015 0837   AST 26 04/21/2014 0910   ALT 12 04/28/2015 0837   ALT 19 04/21/2014 0910   ALKPHOS 94 04/28/2015 0837   ALKPHOS 99 04/21/2014 0910   BILITOT 0.32 04/28/2015 0837   BILITOT 0.5 04/21/2014  0910   GFRNONAA >90 04/21/2014 0910   GFRAA >90 04/21/2014 0910    I No results found for: SPEP  Lab Results  Component Value Date   WBC 1.3* 05/05/2015   NEUTROABS 0.6* 05/05/2015   HGB 11.8 05/05/2015   HCT 33.7* 05/05/2015   MCV 95.5 05/05/2015   PLT 130* 05/05/2015      Chemistry      Component Value Date/Time   NA 143 04/28/2015 0837   NA 141 04/21/2014 0910   K 3.8 04/28/2015 0837   K 3.6 04/21/2014 0910   CL 103 04/21/2014 0910   CL 102 01/16/2012 0804   CO2 26 04/28/2015 0837   CO2 27 04/21/2014 0910   BUN 15.0 04/28/2015 0837   BUN 9 04/21/2014 0910   CREATININE 0.9 04/28/2015 0837   CREATININE 0.77 04/21/2014 0910      Component Value Date/Time   CALCIUM 9.5 04/28/2015 0837   CALCIUM 9.5 04/21/2014 0910   ALKPHOS 94 04/28/2015 0837   ALKPHOS 99 04/21/2014 0910   AST 17 04/28/2015 0837   AST 26 04/21/2014 0910   ALT 12 04/28/2015 0837   ALT 19 04/21/2014 0910   BILITOT 0.32 04/28/2015 0837   BILITOT 0.5 04/21/2014 0910       Lab Results  Component Value Date   LABCA2 24 05/31/2011    No components found for: XIPJA250  No results for input(s): INR in the last 168 hours.  Urinalysis No results found for: COLORURINE  STUDIES: Ct Chest W Contrast  05/03/2015  CLINICAL DATA:  Right breast cancer, status post right mastectomy, chemotherapy ongoing EXAM: CT CHEST WITH CONTRAST TECHNIQUE: Multidetector CT  imaging of the chest was performed during intravenous contrast administration. CONTRAST:  96m ISOVUE-300 IOPAMIDOL (ISOVUE-300) INJECTION 61% COMPARISON:  CT chest dated 12/16/2014 FINDINGS: Mediastinum/Nodes: Heart is normal in size. No pericardial effusion. No evidence of thoracic aortic aneurysm. No suspicious mediastinal lymphadenopathy. Status post right axillary lymph node dissection. Visualized thyroid is unremarkable. Lungs/Pleura: Radiation changes at the right lung apex. Biapical pleural-parenchymal scarring. No focal consolidation. Scattered small bilateral pulmonary nodules, grossly unchanged, including: --3 mm anterior right upper lobe nodule (series 5/image 38) --4 mm left lower lobe nodule along the left fissure (series 5/image 46) --3 mm right lower lobe nodule (series 5/image 61) --4 mm left lower lobe nodule (series 5/image 65) --3 mm right lower lobe nodule (series 5/image 67) --5 mm left lower lobe nodule (series 5/image 71) No pleural effusion or pneumothorax. Upper abdomen: Visualized upper abdomen is notable for scattered hepatic cysts measuring up to 8 mm, cholecystectomy clips, and stable mild (postsurgical) prominence of the common duct. Musculoskeletal: Status post right mastectomy. Status post posterior spinal fixation at T6-10. Stable pathologic fracture at T8 with underlying metastasis. Stable mixed lytic/ sclerotic metastases involving T7 and T9 (sagittal image 75). Stable mixed lytic/sclerotic metastasis at T12 (sagittal image 68) and sclerotic metastasis at L1 (sagittal image 77). IMPRESSION: Status post right mastectomy and right axillary lymph node dissection. Scattered small bilateral pulmonary nodules measuring up to 5 mm in the left lower lobe, unchanged. Status post T6-10 posterior spinal fixation. Stable osseous metastases in the visualized thoracolumbar spine. Correlate with pending bone scan. No finding is suspicious for progressive disease in the chest. Electronically  Signed   By: SJulian HyM.D.   On: 05/03/2015 08:52   Nm Bone Scan Whole Body  05/03/2015  CLINICAL DATA:  Breast cancer. EXAM: NUCLEAR MEDICINE WHOLE BODY BONE SCAN TECHNIQUE: Whole body anterior  and posterior images were obtained approximately 3 hours after intravenous injection of radiopharmaceutical. RADIOPHARMACEUTICALS:  26.0 MCi Technetium-63mMDP IV COMPARISON:  12/16/2014. FINDINGS: Bilateral renal function excretion is present. Stable focal area of increased uptake right T12 vertebral body. Stable left mandible increase uptake. No new abnormalities noted suggest progressive metastatic disease. IMPRESSION: 1. Stable focally of increase uptake in the right T12 vertebral body and left mandible. No new lesions identified. Electronically Signed   By: TMarcello Moores Register   On: 05/03/2015 11:35    ASSESSMENT: 45y.o. BRCA negative Carolyn [name] woman status post right breast upper outer quadrant and right axillary lymph node biopsy 05/18/2011, both positive for an invasive ductal carcinoma, high-grade, clinicallyT2 N1-2 or stage IIB/IIIA,  estrogen receptor 100% positive, progesterone receptor 87% positive, with an MIB-1 of 14% and no HER-2 amplification ([id number].  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   (a) VUS were [place]  (2) additional right breast biopsy upper inner quadrant 06/08/2011 showed only a fibroadenoma, and central left breast biopsy for another suspicious lesion showed only fibrocystic changes ([id number]).   (3)Treated neoadjuvantly with cyclophosphamide and docetaxel x4 completed 08/29/2011.  (4) status post right modified radical mastectomy 10/03/2011 showing a residual pT1c pN1a (3/18 lymph nodes positive) invasive ductal carcinoma, grade 1,estrogen receptor 100% positive, progesterone receptor negative, with no HER-2 amplification ([id number])  (5) adjuvant radiation to  the right chest wall, right supraclavicular fossa and right scar completed 12/26/2011  (6) tamoxifen started January 2014-- discontinued April 2016 with evidence of metastatic disease  (7) status post total hysterectomy with bilateral salpingo-oophorectomy 12/23/2012 with benign pathology ([id number]  METASTATIC DISEASE 04/13/2014 (8) presented with T8 cord compression and underwent laminectomy 04/13/2014 with T8 decompression of spinal cord, posterior fixation from T6-T10 with pedicle screws and rods, posterior arthrodesis with allograft. 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 ExacTrac Snap verification was performed for each couch angle.   (b) radiation to the T-spine completed 05/08/2014.  (c) "[facility]  (11) started fulvestrant 05/18/2014 and Palbociclib 06/01/2014  (a) Palbociclib dose decreased to 100 mg daily, 21/7 as of 08/05/2014  (b) palbociclib dose decreased to 75 mg daily, 21/7, starting May 2017  (12) started zolendronate 05/18/2014, repeated every 12 weeks  PLAN: WenJalonda doing remarkably well as far as her stage IV breast cancer is concerned. We reviewed the results of her bone scan and CT scan of the chest, which showed no evidence of disease progression and totally stable disease.  The plan accordingly, as far as her peripheral disease is concerned, is to continue current treatment. I am dropping the palbociclib dose to 75 mg daily given her  neutropenia today. We will recheck her counts in 2 weeks and she should be able to resume treatment at that time. Of course we will also check her counts when she receives her Faslodex which will be 2 weeks after that. She will receive Faslodex and zolendronate today, but her  next dose of Faslodex will be moved back to Mondays at her request, and I will be 05/31/2015.  Her central nervous system metastatic disease is followed by radiation oncology here and at [facility]. She believes will be set up for an MRI of the brain in May and she will see both Dr. Lisbeth Renshaw and Dr. Satira Sark at that time. She is going to return to see me in July. I will obtain a chest x-ray just before that visit.  I cautioned her to the careful with handwashing and other neutropenic precautions for the next week or so. I don't believe she needs Neupogen at this point.  Overall  I'm delighted that she is doing as well as she is. She knows to call for any problems that may develop before her next visit here.   Chauncey Cruel, MD   05/05/2015 8:42 AM

## 2015-05-07 MED FILL — AMOX-CLAV 875-125 MG TABLET: 875-125 | 10 days supply | Qty: 20 | Fill #0

## 2015-05-11 ENCOUNTER — Telehealth: Payer: Self-pay | Admitting: *Deleted

## 2015-05-11 MED FILL — CHLORHEXIDINE 0.12% RINSE: 0.12 | 16 days supply | Qty: 473 | Fill #0

## 2015-05-11 NOTE — Telephone Encounter (Signed)
FYI "I received Ibrance 75 mg.  The last dose was taken May 05, 2015,  Do I start this now or wait until my lab work on May 17, 2015?"  Recent visit 05-05-2015 she was neutropenic.  Plan reads to recheck counts in two weeks to determine ability to resume Palbociclib at this lower dose.  Advised to wait until labs and a nurse will call with results and instructions.

## 2015-05-17 ENCOUNTER — Other Ambulatory Visit (HOSPITAL_BASED_OUTPATIENT_CLINIC_OR_DEPARTMENT_OTHER): Payer: BC Managed Care – PPO

## 2015-05-17 ENCOUNTER — Telehealth: Payer: Self-pay | Admitting: *Deleted

## 2015-05-17 DIAGNOSIS — C7951 Secondary malignant neoplasm of bone: Secondary | ICD-10-CM

## 2015-05-17 DIAGNOSIS — C7931 Secondary malignant neoplasm of brain: Principal | ICD-10-CM

## 2015-05-17 DIAGNOSIS — C50411 Malignant neoplasm of upper-outer quadrant of right female breast: Secondary | ICD-10-CM | POA: Diagnosis not present

## 2015-05-17 DIAGNOSIS — C50911 Malignant neoplasm of unspecified site of right female breast: Secondary | ICD-10-CM

## 2015-05-17 LAB — CBC WITH DIFFERENTIAL/PLATELET
BASO%: 1.1 % (ref 0.0–2.0)
Basophils Absolute: 0 10*3/uL (ref 0.0–0.1)
EOS ABS: 0.2 10*3/uL (ref 0.0–0.5)
EOS%: 9.6 % — AB (ref 0.0–7.0)
HEMATOCRIT: 38.1 % (ref 34.8–46.6)
HGB: 12.9 g/dL (ref 11.6–15.9)
LYMPH#: 0.7 10*3/uL — AB (ref 0.9–3.3)
LYMPH%: 29.3 % (ref 14.0–49.7)
MCH: 33.3 pg (ref 25.1–34.0)
MCHC: 33.9 g/dL (ref 31.5–36.0)
MCV: 98.5 fL (ref 79.5–101.0)
MONO#: 0.3 10*3/uL (ref 0.1–0.9)
MONO%: 12.3 % (ref 0.0–14.0)
NEUT#: 1.1 10*3/uL — ABNORMAL LOW (ref 1.5–6.5)
NEUT%: 47.7 % (ref 38.4–76.8)
PLATELETS: 157 10*3/uL (ref 145–400)
RBC: 3.87 10*6/uL (ref 3.70–5.45)
RDW: 14.2 % (ref 11.2–14.5)
WBC: 2.4 10*3/uL — AB (ref 3.9–10.3)

## 2015-05-17 NOTE — Telephone Encounter (Signed)
This RN spoke with pt per lab work today with [place] of 1.1.  Pt is due to restart next 21 day cycle of Ibrance 75 mg today.  Per lab review - informed pt to hold Ibrance at present and to check lab in 1 week.  This RN scheduled appointment with pt while on the phone.  No further questions at time of call but pt did leave a message later in the day wanting Dr [name] to know that she was placed on Amoxicillan by her dentist.  This RN spoke with pt again per above - Shastelyn states she had tooth extracted 4/20 and was placed on an antibiotic at that time.  She is scheduled for dental follow up 5/10.  Above information will be given to MD.

## 2015-05-20 ENCOUNTER — Other Ambulatory Visit: Payer: Self-pay | Admitting: Radiation Therapy

## 2015-05-20 DIAGNOSIS — C7949 Secondary malignant neoplasm of other parts of nervous system: Principal | ICD-10-CM

## 2015-05-20 DIAGNOSIS — C7931 Secondary malignant neoplasm of brain: Secondary | ICD-10-CM

## 2015-05-21 MED FILL — AMOX-CLAV 875-125 MG TABLET: 875-125 | 10 days supply | Qty: 20 | Fill #0

## 2015-05-24 ENCOUNTER — Other Ambulatory Visit (HOSPITAL_BASED_OUTPATIENT_CLINIC_OR_DEPARTMENT_OTHER): Payer: BC Managed Care – PPO

## 2015-05-24 ENCOUNTER — Telehealth: Payer: Self-pay | Admitting: *Deleted

## 2015-05-24 DIAGNOSIS — C50411 Malignant neoplasm of upper-outer quadrant of right female breast: Secondary | ICD-10-CM | POA: Diagnosis not present

## 2015-05-24 DIAGNOSIS — C50911 Malignant neoplasm of unspecified site of right female breast: Secondary | ICD-10-CM

## 2015-05-24 DIAGNOSIS — C7931 Secondary malignant neoplasm of brain: Principal | ICD-10-CM

## 2015-05-24 DIAGNOSIS — C7951 Secondary malignant neoplasm of bone: Secondary | ICD-10-CM

## 2015-05-24 LAB — COMPREHENSIVE METABOLIC PANEL
ALBUMIN: 4.1 g/dL (ref 3.5–5.0)
ALK PHOS: 90 U/L (ref 40–150)
ALT: 17 U/L (ref 0–55)
AST: 21 U/L (ref 5–34)
Anion Gap: 8 mEq/L (ref 3–11)
BUN: 12.8 mg/dL (ref 7.0–26.0)
CO2: 30 mEq/L — ABNORMAL HIGH (ref 22–29)
Calcium: 9.8 mg/dL (ref 8.4–10.4)
Chloride: 106 mEq/L (ref 98–109)
Creatinine: 0.8 mg/dL (ref 0.6–1.1)
EGFR: >90 mL/min/{1.73_m2} (ref >90)
GLUCOSE: 74 mg/dL (ref 70–140)
POTASSIUM: 3.8 meq/L (ref 3.5–5.1)
SODIUM: 143 meq/L (ref 136–145)
TOTAL PROTEIN: 7.5 g/dL (ref 6.4–8.3)

## 2015-05-24 LAB — CBC WITH DIFFERENTIAL/PLATELET
BASO%: 0.6 % (ref 0.0–2.0)
Basophils Absolute: 0 10*3/uL (ref 0.0–0.1)
EOS ABS: 0.4 10*3/uL (ref 0.0–0.5)
EOS%: 11.4 % — AB (ref 0.0–7.0)
HCT: 38.1 % (ref 34.8–46.6)
HEMOGLOBIN: 12.8 g/dL (ref 11.6–15.9)
LYMPH#: 0.8 10*3/uL — AB (ref 0.9–3.3)
LYMPH%: 21.9 % (ref 14.0–49.7)
MCH: 33 pg (ref 25.1–34.0)
MCHC: 33.7 g/dL (ref 31.5–36.0)
MCV: 98 fL (ref 79.5–101.0)
MONO#: 0.4 10*3/uL (ref 0.1–0.9)
MONO%: 10.5 % (ref 0.0–14.0)
NEUT%: 55.6 % (ref 38.4–76.8)
NEUTROS ABS: 2.1 10*3/uL (ref 1.5–6.5)
Platelets: 190 10*3/uL (ref 145–400)
RBC: 3.89 10*6/uL (ref 3.70–5.45)
RDW: 13.4 % (ref 11.2–14.5)
WBC: 3.7 10*3/uL — ABNORMAL LOW (ref 3.9–10.3)

## 2015-05-24 NOTE — Telephone Encounter (Signed)
This RN spoke with pt per lab work performed today. Noted WBC and ANC within parimeters to restart next cycle of Ibrance.  Per conversation Livia states she was restarted on amoxicillan by her dentist due to recent dental extraction.  Plan per above - is pt will continue to hold the Ibrance at this time and complete current antibiotic. She is scheduled to come in next Monday for lab and injection.  Restart will be assessed at that time.

## 2015-05-31 ENCOUNTER — Ambulatory Visit (HOSPITAL_BASED_OUTPATIENT_CLINIC_OR_DEPARTMENT_OTHER): Payer: BC Managed Care – PPO

## 2015-05-31 ENCOUNTER — Other Ambulatory Visit (HOSPITAL_BASED_OUTPATIENT_CLINIC_OR_DEPARTMENT_OTHER): Payer: BC Managed Care – PPO

## 2015-05-31 VITALS — BP 122/60 | HR 77 | Temp 98.3°F | Resp 16

## 2015-05-31 DIAGNOSIS — C50411 Malignant neoplasm of upper-outer quadrant of right female breast: Secondary | ICD-10-CM

## 2015-05-31 DIAGNOSIS — C7951 Secondary malignant neoplasm of bone: Secondary | ICD-10-CM

## 2015-05-31 DIAGNOSIS — C50911 Malignant neoplasm of unspecified site of right female breast: Secondary | ICD-10-CM

## 2015-05-31 DIAGNOSIS — C50919 Malignant neoplasm of unspecified site of unspecified female breast: Secondary | ICD-10-CM

## 2015-05-31 DIAGNOSIS — C7931 Secondary malignant neoplasm of brain: Secondary | ICD-10-CM

## 2015-05-31 DIAGNOSIS — Z5111 Encounter for antineoplastic chemotherapy: Secondary | ICD-10-CM

## 2015-05-31 LAB — CBC WITH DIFFERENTIAL/PLATELET
BASO%: 0.8 % (ref 0.0–2.0)
Basophils Absolute: 0 10*3/uL (ref 0.0–0.1)
EOS%: 10.3 % — ABNORMAL HIGH (ref 0.0–7.0)
Eosinophils Absolute: 0.3 10*3/uL (ref 0.0–0.5)
HEMATOCRIT: 38.7 % (ref 34.8–46.6)
HGB: 13.1 g/dL (ref 11.6–15.9)
LYMPH#: 0.8 10*3/uL — AB (ref 0.9–3.3)
LYMPH%: 26.1 % (ref 14.0–49.7)
MCH: 32.8 pg (ref 25.1–34.0)
MCHC: 33.9 g/dL (ref 31.5–36.0)
MCV: 96.7 fL (ref 79.5–101.0)
MONO#: 0.4 10*3/uL (ref 0.1–0.9)
MONO%: 11.3 % (ref 0.0–14.0)
NEUT%: 51.5 % (ref 38.4–76.8)
NEUTROS ABS: 1.7 10*3/uL (ref 1.5–6.5)
PLATELETS: 172 10*3/uL (ref 145–400)
RBC: 4 10*6/uL (ref 3.70–5.45)
RDW: 13 % (ref 11.2–14.5)
WBC: 3.2 10*3/uL — ABNORMAL LOW (ref 3.9–10.3)

## 2015-05-31 MED ORDER — FULVESTRANT 250 MG/5ML IM SOLN
500.0000 mg | Freq: Once | INTRAMUSCULAR | Status: AC
  Administered 2015-05-31: 500 mg via INTRAMUSCULAR
  Filled 2015-05-31: qty 10

## 2015-05-31 NOTE — Patient Instructions (Signed)

## 2015-06-08 ENCOUNTER — Telehealth: Payer: Self-pay | Admitting: *Deleted

## 2015-06-08 ENCOUNTER — Ambulatory Visit
Admission: RE | Admit: 2015-06-08 | Discharge: 2015-06-08 | Disposition: A | Discharge status: Home / Self Care | Payer: BC Managed Care – PPO | Source: Ambulatory Visit | Attending: Radiation Oncology | Admitting: Radiation Oncology

## 2015-06-08 DIAGNOSIS — C7949 Secondary malignant neoplasm of other parts of nervous system: Principal | ICD-10-CM

## 2015-06-08 DIAGNOSIS — C7931 Secondary malignant neoplasm of brain: Secondary | ICD-10-CM

## 2015-06-08 MED ORDER — GADOBENATE DIMEGLUMINE 529 MG/ML IV SOLN
11.0000 mL | Freq: Once | INTRAVENOUS | Status: AC | PRN
  Administered 2015-06-08: 11 mL via INTRAVENOUS

## 2015-06-08 NOTE — Telephone Encounter (Signed)
Labs obtained 5/22 with ANC of 1.7- pt restarted next cycle75mg  QOD Ibrance 5/23. Next lab scheduled 06/14/2015

## 2015-06-09 ENCOUNTER — Encounter: Payer: Self-pay | Admitting: Radiation Oncology

## 2015-06-09 ENCOUNTER — Ambulatory Visit
Admission: RE | Admit: 2015-06-09 | Discharge: 2015-06-09 | Disposition: A | Discharge status: Home / Self Care | Payer: BC Managed Care – PPO | Source: Ambulatory Visit | Attending: Radiation Oncology | Admitting: Radiation Oncology

## 2015-06-09 VITALS — BP 104/78 | HR 109 | Temp 97.6°F | Ht 68.0 in | Wt 125.3 lb

## 2015-06-09 DIAGNOSIS — M899 Disorder of bone, unspecified: Secondary | ICD-10-CM | POA: Insufficient documentation

## 2015-06-09 DIAGNOSIS — D709 Neutropenia, unspecified: Secondary | ICD-10-CM | POA: Diagnosis not present

## 2015-06-09 DIAGNOSIS — G9389 Other specified disorders of brain: Secondary | ICD-10-CM | POA: Diagnosis not present

## 2015-06-09 DIAGNOSIS — Z170 Estrogen receptor positive status [ER+]: Secondary | ICD-10-CM | POA: Insufficient documentation

## 2015-06-09 DIAGNOSIS — C50911 Malignant neoplasm of unspecified site of right female breast: Secondary | ICD-10-CM

## 2015-06-09 DIAGNOSIS — C7931 Secondary malignant neoplasm of brain: Secondary | ICD-10-CM | POA: Diagnosis present

## 2015-06-09 NOTE — Addendum Note (Signed)
Encounter addended by: Benn Moulder, RN on: 06/09/2015 11:29 AM<BR>     Documentation filed: Visit Diagnoses

## 2015-06-09 NOTE — Progress Notes (Signed)
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  In summary this is a very young patient who is diagnosed metastatic right breast cancer to the brain who underwent stereotactic radiosurgery to the brain lesion in 2016. She continued to have edema and subsequently was referred to Dr. Mort Sawyers at [facility]. She underwent LITT on 11/21/14. She has been followed closely since, with reassuring MRI scan. She did undergo a repeat MRI on  06/08/2015, the previously treated left periventricular lesion is stable since her scan in February 2017 measuring 18 mm, there is less pronounced intrinsic and surrounding FLAIR hyperintensity. Posttreatment blood products along the tract of the ablation device was demonstrated. No regional mass effect was present. No new lesions were identified. From a systemic perspective, she has been followed closely as well as Dr. [name], and  remians on fulvestrant, palbociclib, and zolendronate. When she was seen in April, her ppalbociclib was decreased to 68m due to neutropenia.                     On review of systems, the patient reports that she is doing well overall. She denies any chest pain, shortness of breath, cough, fevers, chills, night sweats, unintended weight changes. She denies any bowel or bladder disturbances, and denies abdominal pain, nausea or vomiting. She is not experiencing any blurred vision, double vision, auditory disturbances. She has not had any falls  and continues to find that she is becoming stronger and uses a cane for walking. She reports some decreased energy and attributes this to her systemic therapies. She denies any new musculoskeletal or joint aches or pains. A complete review of systems is obtained and is otherwise negative.   Past Medical History:  Past Medical History  Diagnosis Date  . 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 Hx of radiation therapy 11/08/11 -12/26/11    right chest wall/supraclav fossa, right scar  . History of chemotherapy 06/27/11 -09/04/11    neoadjuvant  . S/P radiation therapy 05/08/14    SRS brain    Past Surgical History: Past Surgical History  Procedure Laterality Date  . Wisdom tooth extraction    . Portacath placement  06/20/2011    Procedure: INSERTION PORT-A-CATH;  Surgeon: CHaywood Lasso MD;  [facility] [facility]  Service: General;  Laterality: N/A;  . Modified mastectomy  10/03/2011    Procedure: MODIFIED MASTECTOMY;  Surgeon: CHaywood Lasso MD;  [facility] [facility]  Service: General;  Laterality: Right;  . Port-a-cath removal  01/30/2012    Procedure: MINOR REMOVAL PORT-A-CATH;  Surgeon: CHaywood Lasso MD;  [place] [facility]  Service: General;  Laterality: Left;  . Breast biopsy      Left  . Robotic assisted total hysterectomy with bilateral salpingo oopherectomy Bilateral 12/23/2012    Procedure: ROBOTIC ASSISTED TOTAL HYSTERECTOMY WITH BILATERAL SALPINGO OOPHORECTOMY;  Surgeon: SMarvene Staff MD;  Location: [facility];  Service: Gynecology;  Laterality: Bilateral;  . Cholecystectomy N/A 03/01/2014    Procedure: LAPAROSCOPIC CHOLECYSTECTOMY;  Surgeon: Leighton Ruff, MD;  Location: [facility];  Service: General;  Laterality: N/A;  . Laminectomy N/A 04/13/2014    Procedure: THORACIC LAMINECTOMY WITH FIXATION THORACIC SIX-THORACIC TEN FUSION;  Surgeon: Kristeen Miss, MD;  Location: [facility];   Service: Neurosurgery;  Laterality: N/A;  . Brain surgery-litt Left 11/20/14    LITT procedure for Lt brain met.    Social History:  Social History   Social History  . Marital Status: Single    Spouse Name: N/A  . Number of Children: N/A  . Years of Education: N/A   Occupational History  . Not on file.   Social History Main Topics  . Smoking status: Never Smoker   . Smokeless tobacco: Never Used  . Alcohol Use: No  . Drug Use: No  . Sexual Activity: Not Currently    Birth Control/ Protection: Condom   Other Topics Concern  . Not on file   Social History Narrative    Family History: Family History  Problem Relation Age of Onset  . Breast cancer Maternal Aunt 39  . Pancreatic cancer Maternal Grandfather   . Pancreatic cancer Maternal Aunt     died in her 56s  . Leukemia Maternal Aunt     died in her 46s  . Ovarian cancer Cousin 26    maternal cousin    ALLERGIES:  is allergic to allegra and shellfish allergy.  Meds: Current Outpatient Prescriptions  Medication Sig Dispense Refill  . acetaminophen (TYLENOL) 325 MG tablet Take 2 tablets (650 mg total) by mouth every 6 (six) hours as needed for mild pain, moderate pain, fever or headache.    . Calcium Carbonate-Vit D-Min (CALCIUM 1200 PO) Take 1 tablet by mouth 2 (two) times daily.    . Cholecalciferol (VITAMIN D3) 2000 UNITS TABS Take 2,000 Units by mouth daily.    . Multiple Vitamin (MULTIVITAMIN) tablet Take 1 tablet by mouth daily.    . palbociclib (IBRANCE) 75 MG capsule TAKE 1 CAPSULE BY MOUTH DAILY WITH BREAKFAST. TAKE WHOLE WITH FOOD. TAKE DAILY FOR 21 DAYS AND THEN OFF FOR 7 DAYS. 21 capsule 6   No current facility-administered medications for this encounter.    Physical Findings:  height is 5' 8" (1.727 m) and weight is 125 lb 4.8 oz (56.836 kg). Her temperature is 97.6 F (36.4 C). Her blood pressure is 104/78 and her pulse is 109. .   In general this is a well appearing African-American female in no  acute distress. She's alert and oriented x4 and appropriate throughout the examination. Cardiopulmonary assessment is negative for acute distress and she exhibits normal effort. She appears to be grossly intact neurologically. She walks with a slow, unsteady gait with the assistance of cane.  Lab Findings: Lab Results  Component Value Date   WBC 3.2* 05/31/2015   HGB 13.1 05/31/2015   HCT 38.7 05/31/2015   MCV 96.7 05/31/2015   PLT 172 05/31/2015     Radiographic Findings: Mr Jeri Cos ZM Contrast  06/08/2015  CLINICAL DATA:  45 year old female with metastatic breast cancer. Completed stereotactic radiosurgery to a left parietal lesion on 04/29/2014 to a dose of 20 gray, followed by auto-litt procedure (laser ablation) November 2016. Chronic Left temporal bone lesion.  Subsequent encounter. EXAM: MRI HEAD WITHOUT AND WITH CONTRAST TECHNIQUE: Multiplanar, multiecho pulse sequences of the brain and surrounding structures were obtained without  and with intravenous contrast. CONTRAST:  4m MULTIHANCE GADOBENATE DIMEGLUMINE 529 MG/ML IV SOLN COMPARISON:  03/02/2015 and earlier. FINDINGS: 18 mm diameter treated lesion in the left periventricular white matter is stable in size and configuration since February, with a mix of mild residual enhancement and post treatment intrinsic T1 hyperintense blood products. There is less pronounced intrinsic and surrounding FLAIR hyperintensity. Post treatment blood products along with mild linear blood products along the tract of the ablation device re- demonstrated on T2 * imaging. No regional mass effect. No other abnormal enhancement of the brain. No dural thickening. Visible bone marrow signal is stable. This includes the chronic enhancing and slightly T2 hyperintense area of the left temporal bone/otic capsule. Negative visualized cervical spinal cord. Major intracranial vascular flow voids are stable. No restricted diffusion or evidence of acute infarction. No  intracranial mass effect. No ventriculomegaly negative pituitary and cervicomedullary junction. No acute intracranial hemorrhage identified. Stable and negative gray and white matter signal outside of the treatment area. Mastoids and paranasal sinuses remain clear. Negative orbit and scalp soft tissues. IMPRESSION: 1. Stable post treatment appearance of the solitary left parietal brain metastasis since February. No associated mass effect or edema. No new brain metastasis identified. 2. Continued stable MRI appearance of the left temporal bone lesion since April 2016, now favored to be benign. Electronically Signed   By: HGenevie AnnM.D.   On: 06/08/2015 17:00    Impression/Plan: 1. 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 The patient appears to be clinically without evidence of disease as well as radiographically. She is doing well overall, and we'll continue to follow up with uKoreain 3 months time with repeat imaging. She is also due to see Dr. TCarles Colletin August 2017, and will see Dr. MJana Hakimon 07/26/2015.  The above documentation reflects my direct findings during this shared patient visit. Please see the separate note by Dr. MLisbeth Renshawon this date for the remainder of the patient's plan of care.    ACarola Rhine PAC

## 2015-06-09 NOTE — Progress Notes (Signed)
.  Carolyn [name] here for results of her MR Brain from 06/08/15.  Note unsteady gait and use of cane to ambulate.  Denies any headaches,headaches, nor changes in fine motor movement.    BP 95/68 mmHg  Pulse 99  Temp(Src) 97.6 F (36.4 C)  Ht 5\' 8"  (1.727 m)  Wt 125 lb 4.8 oz (56.836 kg)  BMI 19.06 kg/m2  LMP 07/02/2011- sitting  BP 104/78 mmHg  Pulse 109  Temp(Src) 97.6 F (36.4 C)  Ht 5\' 8"  (1.727 m)  Wt 125 lb 4.8 oz (56.836 kg)  BMI 19.06 kg/m2  LMP 07/02/2011 -standing

## 2015-06-10 NOTE — Addendum Note (Signed)
Encounter addended by: Benn Moulder, RN on: 06/10/2015 10:46 AM<BR>     Documentation filed: Charges VN

## 2015-06-14 ENCOUNTER — Other Ambulatory Visit (HOSPITAL_BASED_OUTPATIENT_CLINIC_OR_DEPARTMENT_OTHER): Payer: BC Managed Care – PPO

## 2015-06-14 DIAGNOSIS — C50911 Malignant neoplasm of unspecified site of right female breast: Secondary | ICD-10-CM

## 2015-06-14 DIAGNOSIS — C7951 Secondary malignant neoplasm of bone: Secondary | ICD-10-CM | POA: Diagnosis not present

## 2015-06-14 DIAGNOSIS — C50411 Malignant neoplasm of upper-outer quadrant of right female breast: Secondary | ICD-10-CM

## 2015-06-14 DIAGNOSIS — C7931 Secondary malignant neoplasm of brain: Secondary | ICD-10-CM

## 2015-06-14 LAB — CBC WITH DIFFERENTIAL/PLATELET
BASO%: 1.3 % (ref 0.0–2.0)
Basophils Absolute: 0 10*3/uL (ref 0.0–0.1)
EOS%: 8.4 % — ABNORMAL HIGH (ref 0.0–7.0)
Eosinophils Absolute: 0.2 10*3/uL (ref 0.0–0.5)
HCT: 37.2 % (ref 34.8–46.6)
HGB: 12.6 g/dL (ref 11.6–15.9)
LYMPH%: 32.6 % (ref 14.0–49.7)
MCH: 32.4 pg (ref 25.1–34.0)
MCHC: 33.8 g/dL (ref 31.5–36.0)
MCV: 95.8 fL (ref 79.5–101.0)
MONO#: 0.1 10*3/uL (ref 0.1–0.9)
MONO%: 4.4 % (ref 0.0–14.0)
NEUT#: 1.1 10*3/uL — ABNORMAL LOW (ref 1.5–6.5)
NEUT%: 53.3 % (ref 38.4–76.8)
Platelets: 139 10*3/uL — ABNORMAL LOW (ref 145–400)
RBC: 3.88 10*6/uL (ref 3.70–5.45)
RDW: 12.9 % (ref 11.2–14.5)
WBC: 2.1 10*3/uL — ABNORMAL LOW (ref 3.9–10.3)
lymph#: 0.7 10*3/uL — ABNORMAL LOW (ref 0.9–3.3)

## 2015-06-14 LAB — COMPREHENSIVE METABOLIC PANEL WITH GFR
ALT: 14 U/L (ref 0–55)
AST: 22 U/L (ref 5–34)
Albumin: 3.9 g/dL (ref 3.5–5.0)
Alkaline Phosphatase: 84 U/L (ref 40–150)
Anion Gap: 9 meq/L (ref 3–11)
BUN: 15.5 mg/dL (ref 7.0–26.0)
CO2: 27 meq/L (ref 22–29)
Calcium: 9.6 mg/dL (ref 8.4–10.4)
Chloride: 107 meq/L (ref 98–109)
Creatinine: 0.9 mg/dL (ref 0.6–1.1)
EGFR: 86 ml/min/1.73 m2 — ABNORMAL LOW
Glucose: 96 mg/dL (ref 70–140)
Potassium: 3.9 meq/L (ref 3.5–5.1)
Sodium: 144 meq/L (ref 136–145)
Total Bilirubin: 0.37 mg/dL (ref 0.20–1.20)
Total Protein: 7.4 g/dL (ref 6.4–8.3)

## 2015-06-14 MED FILL — AMOX-CLAV 875-125 MG TABLET: 875-125 | 10 days supply | Qty: 20 | Fill #0

## 2015-06-14 MED FILL — CHLORHEXIDINE 0.12% RINSE: 0.12 | 16 days supply | Qty: 473 | Fill #0

## 2015-06-21 ENCOUNTER — Encounter: Payer: Self-pay | Admitting: Oncology

## 2015-06-21 NOTE — Progress Notes (Signed)
Sent mess to abigail,lauren and grier about scat forms?? Patient left mess on my vmail about them. I advised them she needed call back.

## 2015-06-22 ENCOUNTER — Encounter: Payer: Self-pay | Admitting: *Deleted

## 2015-06-22 NOTE — Progress Notes (Signed)
[employer] representative received message from patient regarding the status of her SCAT application.  Patient stated she had provided a copy of the application to a [facility] staff member, but CSW was unable to located application.  CSW contacted SCAT and patient; all parties were agreeable to starting a new application.  CSW completed SCAT application and faxed to contact at [place] office.  SCAT office will contact patient with follow up information.  CSW encouraged patient to call with any questions or concerns.  Carolyn [name], MSW, LCSW, OSW-C Clinical Social Worker [facility] [phone]

## 2015-06-25 MED FILL — *IBRANCE 75 MG CAPSULE: 75 | 21 days supply | Qty: 21 | Fill #1

## 2015-06-28 ENCOUNTER — Ambulatory Visit (HOSPITAL_BASED_OUTPATIENT_CLINIC_OR_DEPARTMENT_OTHER): Payer: BC Managed Care – PPO

## 2015-06-28 ENCOUNTER — Other Ambulatory Visit (HOSPITAL_BASED_OUTPATIENT_CLINIC_OR_DEPARTMENT_OTHER): Payer: BC Managed Care – PPO

## 2015-06-28 VITALS — BP 107/67 | HR 92 | Temp 99.0°F | Resp 20

## 2015-06-28 DIAGNOSIS — C50911 Malignant neoplasm of unspecified site of right female breast: Secondary | ICD-10-CM

## 2015-06-28 DIAGNOSIS — C7951 Secondary malignant neoplasm of bone: Secondary | ICD-10-CM

## 2015-06-28 DIAGNOSIS — C7931 Secondary malignant neoplasm of brain: Secondary | ICD-10-CM

## 2015-06-28 DIAGNOSIS — Z5111 Encounter for antineoplastic chemotherapy: Secondary | ICD-10-CM | POA: Diagnosis not present

## 2015-06-28 DIAGNOSIS — C50411 Malignant neoplasm of upper-outer quadrant of right female breast: Secondary | ICD-10-CM

## 2015-06-28 DIAGNOSIS — C50919 Malignant neoplasm of unspecified site of unspecified female breast: Secondary | ICD-10-CM

## 2015-06-28 LAB — CBC WITH DIFFERENTIAL/PLATELET
BASO%: 0.6 % (ref 0.0–2.0)
Basophils Absolute: 0 10*3/uL (ref 0.0–0.1)
EOS ABS: 0.1 10*3/uL (ref 0.0–0.5)
EOS%: 3.8 % (ref 0.0–7.0)
HCT: 36.3 % (ref 34.8–46.6)
HGB: 12.7 g/dL (ref 11.6–15.9)
LYMPH%: 47.4 % (ref 14.0–49.7)
MCH: 33.1 pg (ref 25.1–34.0)
MCHC: 35 g/dL (ref 31.5–36.0)
MCV: 94.5 fL (ref 79.5–101.0)
MONO#: 0.2 10*3/uL (ref 0.1–0.9)
MONO%: 10.9 % (ref 0.0–14.0)
NEUT#: 0.6 10*3/uL — ABNORMAL LOW (ref 1.5–6.5)
NEUT%: 37.3 % — AB (ref 38.4–76.8)
PLATELETS: 110 10*3/uL — AB (ref 145–400)
RBC: 3.84 10*6/uL (ref 3.70–5.45)
RDW: 12.9 % (ref 11.2–14.5)
WBC: 1.6 10*3/uL — ABNORMAL LOW (ref 3.9–10.3)
lymph#: 0.7 10*3/uL — ABNORMAL LOW (ref 0.9–3.3)

## 2015-06-28 MED ORDER — FULVESTRANT 250 MG/5ML IM SOLN
500.0000 mg | Freq: Once | INTRAMUSCULAR | Status: AC
  Administered 2015-06-28: 500 mg via INTRAMUSCULAR
  Filled 2015-06-28: qty 10

## 2015-06-28 NOTE — Patient Instructions (Signed)

## 2015-06-29 ENCOUNTER — Other Ambulatory Visit: Payer: Self-pay | Admitting: Oncology

## 2015-06-30 ENCOUNTER — Other Ambulatory Visit: Payer: Self-pay | Admitting: *Deleted

## 2015-06-30 DIAGNOSIS — C7931 Secondary malignant neoplasm of brain: Secondary | ICD-10-CM

## 2015-06-30 DIAGNOSIS — C7949 Secondary malignant neoplasm of other parts of nervous system: Principal | ICD-10-CM

## 2015-07-01 ENCOUNTER — Telehealth: Payer: Self-pay | Admitting: *Deleted

## 2015-07-01 NOTE — Telephone Encounter (Signed)
1.  Do you need a wheel chair?  NO  2. On oxygen? NO  3. Have you ever had any surgery in the body part being scanned?  YES   4. Have you ever had any surgery on your brain or heart?        NO / TO HEART             5. Have you ever had surgery on your eyes or ears?     NO                         6. Do you have a pacemaker or defibrillator?  NO  7. Do you have a Neurostimulator?  NO  8. Claustrophobic?  YES  9. Any risk for metal in eyes? NO  10. Injury by bullet, buckshot, or shrapnel?  NO  11. Stent?  NO                                                                                                                12. Hx of Cancer?   13. Kidney or Liver disease?  NO  14. Hx of Lupus, Rheumatoid Arthritis or Scleroderma? NO  15. IV Antibiotics or long term use of NSAIDS?     63. HX of Hypertension?  NO  17. Diabetes?  NO  18. Allergy to contrast?  NO  19. Recent labs.

## 2015-07-02 MED FILL — AMOX-CLAV 875-125 MG TABLET: 875-125 | 10 days supply | Qty: 20 | Fill #0

## 2015-07-05 ENCOUNTER — Other Ambulatory Visit (HOSPITAL_BASED_OUTPATIENT_CLINIC_OR_DEPARTMENT_OTHER): Payer: BC Managed Care – PPO

## 2015-07-05 DIAGNOSIS — C50911 Malignant neoplasm of unspecified site of right female breast: Secondary | ICD-10-CM

## 2015-07-05 DIAGNOSIS — C50411 Malignant neoplasm of upper-outer quadrant of right female breast: Secondary | ICD-10-CM | POA: Diagnosis not present

## 2015-07-05 DIAGNOSIS — C7951 Secondary malignant neoplasm of bone: Secondary | ICD-10-CM

## 2015-07-05 DIAGNOSIS — C7931 Secondary malignant neoplasm of brain: Principal | ICD-10-CM

## 2015-07-05 LAB — CBC WITH DIFFERENTIAL/PLATELET
BASO%: 1.4 % (ref 0.0–2.0)
Basophils Absolute: 0 10*3/uL (ref 0.0–0.1)
EOS ABS: 0.1 10*3/uL (ref 0.0–0.5)
EOS%: 6.7 % (ref 0.0–7.0)
HCT: 36.6 % (ref 34.8–46.6)
HEMOGLOBIN: 12.7 g/dL (ref 11.6–15.9)
LYMPH%: 37.5 % (ref 14.0–49.7)
MCH: 32.9 pg (ref 25.1–34.0)
MCHC: 34.7 g/dL (ref 31.5–36.0)
MCV: 94.8 fL (ref 79.5–101.0)
MONO#: 0.2 10*3/uL (ref 0.1–0.9)
MONO%: 11.1 % (ref 0.0–14.0)
NEUT%: 43.3 % (ref 38.4–76.8)
NEUTROS ABS: 0.9 10*3/uL — AB (ref 1.5–6.5)
PLATELETS: 168 10*3/uL (ref 145–400)
RBC: 3.86 10*6/uL (ref 3.70–5.45)
RDW: 12.8 % (ref 11.2–14.5)
WBC: 2.1 10*3/uL — AB (ref 3.9–10.3)
lymph#: 0.8 10*3/uL — ABNORMAL LOW (ref 0.9–3.3)

## 2015-07-06 ENCOUNTER — Telehealth: Payer: Self-pay | Admitting: *Deleted

## 2015-07-06 NOTE — Telephone Encounter (Signed)
Per lab 6/26 with ANC 0.9 this RN informed pt to hold restart of Ibrance - lab is to be checked next Monday. Verified appointment for 7/3.  Ryllie verbalized understanding of above.

## 2015-07-12 ENCOUNTER — Other Ambulatory Visit (HOSPITAL_BASED_OUTPATIENT_CLINIC_OR_DEPARTMENT_OTHER): Payer: BC Managed Care – PPO

## 2015-07-12 ENCOUNTER — Telehealth: Payer: Self-pay | Admitting: *Deleted

## 2015-07-12 DIAGNOSIS — C50411 Malignant neoplasm of upper-outer quadrant of right female breast: Secondary | ICD-10-CM

## 2015-07-12 DIAGNOSIS — C7951 Secondary malignant neoplasm of bone: Secondary | ICD-10-CM

## 2015-07-12 DIAGNOSIS — C7931 Secondary malignant neoplasm of brain: Principal | ICD-10-CM

## 2015-07-12 DIAGNOSIS — C50911 Malignant neoplasm of unspecified site of right female breast: Secondary | ICD-10-CM

## 2015-07-12 LAB — COMPREHENSIVE METABOLIC PANEL
ALT: 17 U/L (ref 0–55)
AST: 19 U/L (ref 5–34)
Albumin: 4 g/dL (ref 3.5–5.0)
Alkaline Phosphatase: 103 U/L (ref 40–150)
Anion Gap: 9 mEq/L (ref 3–11)
BUN: 17.9 mg/dL (ref 7.0–26.0)
CO2: 29 meq/L (ref 22–29)
Calcium: 10 mg/dL (ref 8.4–10.4)
Chloride: 106 mEq/L (ref 98–109)
Creatinine: 0.9 mg/dL (ref 0.6–1.1)
EGFR: 90 mL/min/{1.73_m2} — AB (ref >90)
GLUCOSE: 80 mg/dL (ref 70–140)
POTASSIUM: 3.8 meq/L (ref 3.5–5.1)
Sodium: 144 mEq/L (ref 136–145)
TOTAL PROTEIN: 7.6 g/dL (ref 6.4–8.3)

## 2015-07-12 LAB — CBC WITH DIFFERENTIAL/PLATELET
BASO%: 0.4 % (ref 0.0–2.0)
Basophils Absolute: 0 10*3/uL (ref 0.0–0.1)
EOS ABS: 0.2 10*3/uL (ref 0.0–0.5)
EOS%: 7.2 % — AB (ref 0.0–7.0)
HCT: 38.9 % (ref 34.8–46.6)
HEMOGLOBIN: 13.4 g/dL (ref 11.6–15.9)
LYMPH%: 39.8 % (ref 14.0–49.7)
MCH: 32.6 pg (ref 25.1–34.0)
MCHC: 34.4 g/dL (ref 31.5–36.0)
MCV: 94.6 fL (ref 79.5–101.0)
MONO#: 0.2 10*3/uL (ref 0.1–0.9)
MONO%: 9.7 % (ref 0.0–14.0)
NEUT%: 42.9 % (ref 38.4–76.8)
NEUTROS ABS: 1 10*3/uL — AB (ref 1.5–6.5)
Platelets: 162 10*3/uL (ref 145–400)
RBC: 4.11 10*6/uL (ref 3.70–5.45)
RDW: 12.7 % (ref 11.2–14.5)
WBC: 2.4 10*3/uL — AB (ref 3.9–10.3)
lymph#: 0.9 10*3/uL (ref 0.9–3.3)

## 2015-07-12 NOTE — Telephone Encounter (Signed)
ANC 1.0   This RN attempted to reach pt to discuss need to continue to hold Ibrance per continued low ANC.   Obtained identified VM- message left per above as well as need to recheck lab next Monday  Request for appointment to recheck lab 7/10 sent to scheduling.  This RN did ask pt to return call for verification of above plan.

## 2015-07-14 ENCOUNTER — Telehealth: Payer: Self-pay | Admitting: *Deleted

## 2015-07-14 NOTE — Telephone Encounter (Signed)
Lulani returned call to this RN - informed Gavin of continued low ANC of 1.0 and need to continue to hold restart of Ibrance.  Per inquiry of concomitant dental work that she has been taking antibiotics - Laya states she is continuing with dental work but has completed current antibiotic therapy.  Plan per call is pt will continue to hold restart of Ibrance.  Appointment made per call for lab recheck in 1 week.  Verified appointment for lab and MD with injection on 7/19.  No other needs at this time.

## 2015-07-19 ENCOUNTER — Other Ambulatory Visit (HOSPITAL_BASED_OUTPATIENT_CLINIC_OR_DEPARTMENT_OTHER): Payer: BC Managed Care – PPO

## 2015-07-19 DIAGNOSIS — C50411 Malignant neoplasm of upper-outer quadrant of right female breast: Secondary | ICD-10-CM | POA: Diagnosis not present

## 2015-07-19 DIAGNOSIS — C7951 Secondary malignant neoplasm of bone: Secondary | ICD-10-CM

## 2015-07-19 DIAGNOSIS — C50911 Malignant neoplasm of unspecified site of right female breast: Secondary | ICD-10-CM

## 2015-07-19 DIAGNOSIS — C7931 Secondary malignant neoplasm of brain: Principal | ICD-10-CM

## 2015-07-19 LAB — CBC WITH DIFFERENTIAL/PLATELET
BASO%: 0.5 % (ref 0.0–2.0)
Basophils Absolute: 0 10*3/uL (ref 0.0–0.1)
EOS%: 4.8 % (ref 0.0–7.0)
Eosinophils Absolute: 0.1 10*3/uL (ref 0.0–0.5)
HCT: 40.2 % (ref 34.8–46.6)
HEMOGLOBIN: 13.6 g/dL (ref 11.6–15.9)
LYMPH%: 34.5 % (ref 14.0–49.7)
MCH: 32.2 pg (ref 25.1–34.0)
MCHC: 33.8 g/dL (ref 31.5–36.0)
MCV: 95.3 fL (ref 79.5–101.0)
MONO#: 0.3 10*3/uL (ref 0.1–0.9)
MONO%: 12.1 % (ref 0.0–14.0)
NEUT%: 48.1 % (ref 38.4–76.8)
NEUTROS ABS: 1.3 10*3/uL — AB (ref 1.5–6.5)
Platelets: 151 10*3/uL (ref 145–400)
RBC: 4.22 10*6/uL (ref 3.70–5.45)
RDW: 13.3 % (ref 11.2–14.5)
WBC: 2.7 10*3/uL — AB (ref 3.9–10.3)
lymph#: 0.9 10*3/uL (ref 0.9–3.3)

## 2015-07-25 NOTE — Progress Notes (Signed)
[place]  [phone] [phone]     ID: Carolyn [name] DOB: [name] 07, 1971  MR#: [id number]  NUU#:[id number]  Patient Care Team: [name], MD as PCP - General (Family Medicine) Chauncey Cruel, MD as Consulting Physician (Oncology) Kyung Rudd, MD as Consulting Physician (Radiation Oncology) Kathie Rhodes, DMD as Physician Assistant (Dentistry) Burnell Blanks. 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 female. Without significant past medical history. She underwent a mammogram that showed calcifications measuring 5 cm on the right breast. She then went on To have an ultrasound which showed the area to measure 2.9 cm. She was also found to have a right axillary lymph node that was suspicious for a malignancy. She had a biopsy of the calcifications that showed high-grade ductal carcinoma in situ. 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 In the lymph node biopsy there was no lymphatic tissue seen and was felt that the node was replaced by tumor. Patient went on to have an MRI of the bilateral breasts performed on evening of 05/30/2011. The MRI showed in the right breast 5 x 3 x 4.5 cm mass abutting the chest wall. About 7 cm away from this there was another mass in the upper inner quadrant that measured 1.5 x 1.7 x 1.5 cm. On the contralateral breast, that is the left breast a 2 cm area suspicious enhancement was noted also this up to date has not been biopsied and arrangements are being made for the biopsy to be performed. In this side there were no suspicious lymph nodes. The prognostic panel is pending.  Patient is otherwise without any complaints. "  METASTATIC DISEASE: From the earlier summary note:  Cheryllynn noted some strange feelings around her umbilicus 34/74/2595. This felt like an area of numbness. Over the next 2 days she noted some leg weakness and difficulty walking, so she presented to the ED 04/12/2014. MRI of the thoraco-lumbar spine was obtained 04/12/2014 showing multiple bone lesions and compression fracture at T8 with retropulsion and cord compression. On 04/13/2014 she underwent Laminectomy T8 decompression of spinal cord posterior fixation from T6-T10 with pedicle screws and rods posterior arthrodesis with allograft. The pathology from this procedure ([id number] [id number]) showed metastatic adenocarcinoma which was estrogen receptor 69% positive, with moderate staining intensity, progesterone receptor negative. HER-2 could not be obtained.  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 There were nonspecific L temporal bone changes and also possible involvement of the clivus and calvarium, but no other parenchymal brain lesions. 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 She continues on fulvestrant every 28 days, with a dose given today. She is also on palbociclib/Ibrance and her neutrophil count has been borderline for the last several determinations.  There have been some treatment interruption and delays. She is also on zolendronate, with a repeat dose due today. With these treatments she has had excellent control of her  disease outside the brain.  She has also undergone brain radiation and she is due for repeat brain MRI 09/06/2015  REVIEW OF SYSTEMS: Since her last visit here she had some dental issues and according to her report it was found that a root of one of her wisdom teeth had been left in in the left mandible, and this was causing problems. We do have a bone biopsy from 04/29/2015 which showed osteonecrosis and osteomyelitis ([id number]). She has been followed for this by dentistry and tells me that that problem is not "healed".--Aside from these issues, she has no unusual headaches, visual changes, nausea, vomiting, or gait imbalance. She continues to work full-time. She goes to the gym 2-3 times a week. She does use a cane to walk. A detailed review of systems today was otherwise negative.  PAST MEDICAL HISTORY: Past Medical History  Diagnosis Date  . Breast cancer ([place]) 09/2011    invasive ductal carcinoma metastatic ca in 3/14 lymph nodes  . Hx of radiation therapy 11/08/11 -12/26/11    right chest wall/supraclav fossa, right scar  . History of chemotherapy 06/27/11 -09/04/11    neoadjuvant  . S/P radiation therapy 05/08/14    SRS brain    PAST SURGICAL HISTORY: Past Surgical History  Procedure Laterality Date  . Wisdom tooth extraction    . Portacath placement  06/20/2011    Procedure: INSERTION PORT-A-CATH;  Surgeon: Haywood Lasso, MD;  Location: [facility];  Service: General;  Laterality: N/A;  . Modified mastectomy  10/03/2011    Procedure: MODIFIED MASTECTOMY;  Surgeon: Haywood Lasso, MD;  Location: [facility];  Service: General;  Laterality: Right;  . Port-a-cath removal  01/30/2012    Procedure: MINOR REMOVAL PORT-A-CATH;  Surgeon: Haywood Lasso, MD;  Location: [facility];  Service: General;  Laterality: Left;  . Breast biopsy      Left  . Robotic assisted total hysterectomy with bilateral salpingo oopherectomy Bilateral 12/23/2012    Procedure:  ROBOTIC ASSISTED TOTAL HYSTERECTOMY WITH BILATERAL SALPINGO OOPHORECTOMY;  Surgeon: Marvene Staff, MD;  Location: [facility];  Service: Gynecology;  Laterality: Bilateral;  . Cholecystectomy N/A 03/01/2014    Procedure: LAPAROSCOPIC CHOLECYSTECTOMY;  Surgeon: Leighton Ruff, MD;  Location: [facility];  Service: General;  Laterality: N/A;  . Laminectomy N/A 04/13/2014    Procedure: THORACIC LAMINECTOMY WITH FIXATION THORACIC SIX-THORACIC TEN FUSION;  Surgeon: Kristeen Miss, MD;  Location: [facility];  Service: Neurosurgery;  Laterality: N/A;  . Brain surgery-litt Left 11/20/14    LITT procedure for Lt brain met.    FAMILY HISTORY Family History  Problem Relation Age of Onset  . Breast cancer Maternal Aunt 18  . Pancreatic cancer Maternal Grandfather   . Pancreatic cancer Maternal Aunt     died in her 54s  . Leukemia Maternal Aunt     died in her 31s  . Ovarian cancer Cousin 102    maternal cousin   the patient's father is alive, currently 20 years old. The patient's mother died from complications of diabetes at the age of 69. The patient had no brothers, 4 sisters. One sister has died from congestive heart failure. The patient's mother is one of 5 sisters. One of them was diagnosed with breast cancer the age of 66. Also a cousin on  the maternal side it was diagnosed with ovarian cancer. This was approximately age 83. There is also colon cancer in the family. The patient has had extensive genetic testing summarized below. She is however BRCA negative  GYNECOLOGIC HISTORY:  Patient's last menstrual period was 07/02/2011. Menarche age 38, she is GX P0. She underwent total hysterectomy with bilateral salpingo-oophorectomy December 2014.  SOCIAL HISTORY:  Lorella works at [employer] for [employer]. She mostly deals with government documents. She is single, lives by herself, with no pets. She attends [employer] locally.    ADVANCED DIRECTIVES: Not in place. The patient has a documents and intends to  name her sister Dimple Bastyr as healthcare power of attorney. Joelene Millin lives in [place] and can be reached at [place]: Social History  Substance Use Topics  . Smoking status: Never Smoker   . Smokeless tobacco: Never Used  . Alcohol Use: No     Colonoscopy:  PAP:  Bone density:  Lipid panel:  Allergies  Allergen Reactions  . Allegra [Fexofenadine] Hives    Abdomen only  . Shellfish Allergy Hives    Abdomen only    Current Outpatient Prescriptions  Medication Sig Dispense Refill  . acetaminophen (TYLENOL) 325 MG tablet Take 2 tablets (650 mg total) by mouth every 6 (six) hours as needed for mild pain, moderate pain, fever or headache.    . Calcium Carbonate-Vit D-Min (CALCIUM 1200 PO) Take 1 tablet by mouth 2 (two) times daily.    . Cholecalciferol (VITAMIN D3) 2000 UNITS TABS Take 2,000 Units by mouth daily.    . Multiple Vitamin (MULTIVITAMIN) tablet Take 1 tablet by mouth daily.    . palbociclib (IBRANCE) 75 MG capsule TAKE 1 CAPSULE BY MOUTH DAILY WITH BREAKFAST. TAKE WHOLE WITH FOOD. TAKE DAILY FOR 21 DAYS AND THEN OFF FOR 7 DAYS. 21 capsule 6   No current facility-administered medications for this visit.    OBJECTIVE: Middle-aged [place] American woman in no acute distress  Filed Vitals:   07/26/15 0842  BP: 117/67  Pulse: 76  Temp: 98.5 F (36.9 C)  Resp: 18     Body mass index is 19.03 kg/(m^2).    ECOG FS:0 - Asymptomatic  Sclerae unicteric, EOMs intact Oropharynx clear and moist No cervical or supraclavicular adenopathy Lungs no rales or rhonchi Heart regular rate and rhythm Abd soft, nontender, positive bowel sounds MSK no focal spinal tenderness, no upper extremity lymphedema Neuro: nonfocal, well oriented, appropriate affect Breasts: Deferred   LAB RESULTS: Results for HOANG, REICH (MRN [id number]) as of 07/25/2015 11:10  Ref. Range 06/14/2015 08:11 06/28/2015 08:31 07/05/2015 10:06 07/12/2015 08:02 07/19/2015 09:10  NEUT#  Latest Ref Range: 1.5-6.5 10e3/uL 1.1 (L) 0.6 (L) 0.9 (L) 1.0 (L) 1.3 (L)     CMP     Component Value Date/Time   NA 144 07/12/2015 0802   NA 141 04/21/2014 0910   K 3.8 07/12/2015 0802   K 3.6 04/21/2014 0910   CL 103 04/21/2014 0910   CL 102 01/16/2012 0804   CO2 29 07/12/2015 0802   CO2 27 04/21/2014 0910   GLUCOSE 80 07/12/2015 0802   GLUCOSE 133* 04/21/2014 0910   GLUCOSE 92 01/16/2012 0804   BUN 17.9 07/12/2015 0802   BUN 9 04/21/2014 0910   CREATININE 0.9 07/12/2015 0802   CREATININE 0.77 04/21/2014 0910   CALCIUM 10.0 07/12/2015 0802   CALCIUM 9.5 04/21/2014 0910   PROT 7.6 07/12/2015 0802   PROT 6.3 04/21/2014 0910  ALBUMIN 4.0 07/12/2015 0802   ALBUMIN 3.1* 04/21/2014 0910   AST 19 07/12/2015 0802   AST 26 04/21/2014 0910   ALT 17 07/12/2015 0802   ALT 19 04/21/2014 0910   ALKPHOS 103 07/12/2015 0802   ALKPHOS 99 04/21/2014 0910   BILITOT <0.30 07/12/2015 0802   BILITOT 0.5 04/21/2014 0910   GFRNONAA >90 04/21/2014 0910   GFRAA >90 04/21/2014 0910    I No results found for: SPEP  Lab Results  Component Value Date   WBC 3.1* 07/26/2015   NEUTROABS 1.6 07/26/2015   HGB 13.5 07/26/2015   HCT 40.3 07/26/2015   MCV 95.1 07/26/2015   PLT 138* 07/26/2015      Chemistry      Component Value Date/Time   NA 144 07/12/2015 0802   NA 141 04/21/2014 0910   K 3.8 07/12/2015 0802   K 3.6 04/21/2014 0910   CL 103 04/21/2014 0910   CL 102 01/16/2012 0804   CO2 29 07/12/2015 0802   CO2 27 04/21/2014 0910   BUN 17.9 07/12/2015 0802   BUN 9 04/21/2014 0910   CREATININE 0.9 07/12/2015 0802   CREATININE 0.77 04/21/2014 0910      Component Value Date/Time   CALCIUM 10.0 07/12/2015 0802   CALCIUM 9.5 04/21/2014 0910   ALKPHOS 103 07/12/2015 0802   ALKPHOS 99 04/21/2014 0910   AST 19 07/12/2015 0802   AST 26 04/21/2014 0910   ALT 17 07/12/2015 0802   ALT 19 04/21/2014 0910   BILITOT <0.30 07/12/2015 0802   BILITOT 0.5 04/21/2014 0910       Lab  Results  Component Value Date   LABCA2 24 05/31/2011    No components found for: [id number]  No results for input(s): INR in the last 168 hours.  Urinalysis No results found for: COLORURINE  STUDIES: No results found.  ASSESSMENT: 45 y.o. BRCA negative [place] woman status post right breast upper outer quadrant and right axillary lymph node biopsy 05/18/2011, both positive for an invasive ductal carcinoma, high-grade, clinicallyT2 N1-2 or stage IIB/IIIA,  estrogen receptor 100% positive, progesterone receptor 87% positive, with an MIB-1 of 14% and no HER-2 amplification ([id number]).  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   (a) VUS were also found in [place] and BARD1  (2) additional right breast biopsy upper inner quadrant 06/08/2011 showed only a fibroadenoma, and central left breast biopsy for another suspicious lesion showed only fibrocystic changes ([id number] and [id number]).   (3)Treated neoadjuvantly with cyclophosphamide and docetaxel x4 completed 08/29/2011.  (4) status post right modified radical mastectomy 10/03/2011 showing a residual pT1c pN1a (3/18 lymph nodes positive) invasive ductal carcinoma, grade 1,estrogen receptor 100% positive, progesterone receptor negative, with no HER-2 amplification ([id number])  (5) adjuvant radiation to the right chest wall, right supraclavicular fossa and right scar completed 12/26/2011  (6) tamoxifen started January 2014-- discontinued April 2016 with evidence of metastatic disease  (7) status post total hysterectomy with bilateral salpingo-oophorectomy 12/23/2012 with benign pathology ([id number])  METASTATIC DISEASE 04/13/2014 (8) presented with T8 cord compression and underwent laminectomy 04/13/2014 with T8 decompression of spinal cord, posterior fixation from T6-T10 with pedicle screws and rods, posterior arthrodesis with allograft. Pathology confirms an estrogen  receptor positive, progesterone receptor negative metastatic adenocarcinoma.   (9) additional staging studies showed (a) multiple bone lesions, mostly lytic (so not well seen on bone scan) (b) single 0.7 cm brain metastasis at L centrum semiovale  04/29/2014  (c) RUL (8m) and RLL (27m pulmonary nodules  (d) small left liver lesions, possibly mew  (10) RADIATION IN METASTATIC SETTING:  (a) SRS to Lt Post Centrum Semiovale to 20 Gy given 04/29/2014. ExacTrac Snap verification was performed for each couch angle.   (b) radiation to the T-spine completed 05/08/2014.  (c) [facility]  (11) started fulvestrant 05/18/2014 and Palbociclib 06/01/2014  (a) Palbociclib dose decreased to 100 mg daily, 21/7 as of 08/05/2014  (b) palbociclib dose decreased to 75 mg daily, 21/7, starting May 2017  (c) palbociclib dose decreased to 75 mg every other day beginning 07/26/2015    (12) started zolendronate 05/18/2014, repeated every 12 weeks  (a) July zolendronate dose held because of dental issues  PLAN:  WeLennyxontinues to do remarkably well despite her metastatic disease. She tolerates treatment well except that her counts continue to drop because of the palbociclib area accordingly we are dropping the palbociclib dose to 75 mg every other day. She understands this means she will only take 11 tablets over a three-week period who she will then be off for a week. We are going to be checking her counts every 2 weeks through the end of the year. If she cannot tolerate this meaning if her neutrophil count does not recover in time we will discontinue the palbociclib and give her bone marrow a "rest".  We are also going off the zolendronate for now, given her dental problems. I hope to be able to restart that again 10/18/2015, which is also going to be her next appointment with me.  She is already set up for repeat brain MRI in late  August [facility] with radiation oncology here.  Before the end of the year, early December, she will have a repeat PET scan  At this point I am delighted that when she continues to do as well as she is doing. She knows to call for any problems that may develop before her next visit here.  MAChauncey CruelMD   07/26/2015 8:49 AM

## 2015-07-26 ENCOUNTER — Other Ambulatory Visit (HOSPITAL_BASED_OUTPATIENT_CLINIC_OR_DEPARTMENT_OTHER): Payer: BC Managed Care – PPO

## 2015-07-26 ENCOUNTER — Ambulatory Visit (HOSPITAL_BASED_OUTPATIENT_CLINIC_OR_DEPARTMENT_OTHER): Payer: BC Managed Care – PPO

## 2015-07-26 ENCOUNTER — Ambulatory Visit: Payer: BC Managed Care – PPO

## 2015-07-26 ENCOUNTER — Ambulatory Visit (HOSPITAL_BASED_OUTPATIENT_CLINIC_OR_DEPARTMENT_OTHER): Payer: BC Managed Care – PPO | Admitting: Oncology

## 2015-07-26 VITALS — BP 117/67 | HR 76 | Temp 98.5°F | Resp 18 | Ht 68.0 in | Wt 125.1 lb

## 2015-07-26 DIAGNOSIS — C7931 Secondary malignant neoplasm of brain: Secondary | ICD-10-CM | POA: Diagnosis not present

## 2015-07-26 DIAGNOSIS — C7951 Secondary malignant neoplasm of bone: Secondary | ICD-10-CM

## 2015-07-26 DIAGNOSIS — C50911 Malignant neoplasm of unspecified site of right female breast: Secondary | ICD-10-CM

## 2015-07-26 DIAGNOSIS — C773 Secondary and unspecified malignant neoplasm of axilla and upper limb lymph nodes: Secondary | ICD-10-CM

## 2015-07-26 DIAGNOSIS — C50411 Malignant neoplasm of upper-outer quadrant of right female breast: Secondary | ICD-10-CM

## 2015-07-26 DIAGNOSIS — Z5111 Encounter for antineoplastic chemotherapy: Secondary | ICD-10-CM

## 2015-07-26 LAB — CBC WITH DIFFERENTIAL/PLATELET
BASO%: 0.4 % (ref 0.0–2.0)
Basophils Absolute: 0 10*3/uL (ref 0.0–0.1)
EOS%: 3.9 % (ref 0.0–7.0)
Eosinophils Absolute: 0.1 10*3/uL (ref 0.0–0.5)
HEMATOCRIT: 40.3 % (ref 34.8–46.6)
HEMOGLOBIN: 13.5 g/dL (ref 11.6–15.9)
LYMPH%: 32.2 % (ref 14.0–49.7)
MCH: 31.9 pg (ref 25.1–34.0)
MCHC: 33.5 g/dL (ref 31.5–36.0)
MCV: 95.1 fL (ref 79.5–101.0)
MONO#: 0.3 10*3/uL (ref 0.1–0.9)
MONO%: 11.1 % (ref 0.0–14.0)
NEUT%: 52.4 % (ref 38.4–76.8)
NEUTROS ABS: 1.6 10*3/uL (ref 1.5–6.5)
Platelets: 138 10*3/uL — ABNORMAL LOW (ref 145–400)
RBC: 4.24 10*6/uL (ref 3.70–5.45)
RDW: 13 % (ref 11.2–14.5)
WBC: 3.1 10*3/uL — AB (ref 3.9–10.3)
lymph#: 1 10*3/uL (ref 0.9–3.3)

## 2015-07-26 LAB — COMPREHENSIVE METABOLIC PANEL
ALBUMIN: 4.1 g/dL (ref 3.5–5.0)
ALK PHOS: 100 U/L (ref 40–150)
ALT: 16 U/L (ref 0–55)
AST: 21 U/L (ref 5–34)
Anion Gap: 11 mEq/L (ref 3–11)
BUN: 13.7 mg/dL (ref 7.0–26.0)
CALCIUM: 9.8 mg/dL (ref 8.4–10.4)
CO2: 28 mEq/L (ref 22–29)
CREATININE: 0.9 mg/dL (ref 0.6–1.1)
Chloride: 105 mEq/L (ref 98–109)
EGFR: >90 mL/min/{1.73_m2} (ref >90)
GLUCOSE: 94 mg/dL (ref 70–140)
POTASSIUM: 3.8 meq/L (ref 3.5–5.1)
SODIUM: 144 meq/L (ref 136–145)
Total Bilirubin: 0.32 mg/dL (ref 0.20–1.20)
Total Protein: 7.6 g/dL (ref 6.4–8.3)

## 2015-07-26 MED ORDER — FULVESTRANT 250 MG/5ML IM SOLN
500.0000 mg | Freq: Once | INTRAMUSCULAR | Status: AC
  Administered 2015-07-26: 500 mg via INTRAMUSCULAR
  Filled 2015-07-26: qty 10

## 2015-07-26 MED ORDER — PALBOCICLIB 75 MG PO CAPS: ORAL_CAPSULE | ORAL | Status: DC

## 2015-07-26 NOTE — Patient Instructions (Signed)

## 2015-08-09 ENCOUNTER — Other Ambulatory Visit (HOSPITAL_BASED_OUTPATIENT_CLINIC_OR_DEPARTMENT_OTHER): Payer: BC Managed Care – PPO

## 2015-08-09 DIAGNOSIS — C7931 Secondary malignant neoplasm of brain: Principal | ICD-10-CM

## 2015-08-09 DIAGNOSIS — C50411 Malignant neoplasm of upper-outer quadrant of right female breast: Secondary | ICD-10-CM | POA: Diagnosis not present

## 2015-08-09 DIAGNOSIS — C7951 Secondary malignant neoplasm of bone: Secondary | ICD-10-CM

## 2015-08-09 DIAGNOSIS — C50911 Malignant neoplasm of unspecified site of right female breast: Secondary | ICD-10-CM

## 2015-08-09 LAB — CBC WITH DIFFERENTIAL/PLATELET
BASO%: 0.8 % (ref 0.0–2.0)
Basophils Absolute: 0 10*3/uL (ref 0.0–0.1)
EOS%: 8.2 % — AB (ref 0.0–7.0)
Eosinophils Absolute: 0.2 10*3/uL (ref 0.0–0.5)
HCT: 36.8 % (ref 34.8–46.6)
HGB: 12.4 g/dL (ref 11.6–15.9)
LYMPH%: 34.4 % (ref 14.0–49.7)
MCH: 32 pg (ref 25.1–34.0)
MCHC: 33.8 g/dL (ref 31.5–36.0)
MCV: 94.5 fL (ref 79.5–101.0)
MONO#: 0.2 10*3/uL (ref 0.1–0.9)
MONO%: 6.9 % (ref 0.0–14.0)
NEUT#: 1.2 10*3/uL — ABNORMAL LOW (ref 1.5–6.5)
NEUT%: 49.7 % (ref 38.4–76.8)
PLATELETS: 139 10*3/uL — AB (ref 145–400)
RBC: 3.9 10*6/uL (ref 3.70–5.45)
RDW: 13 % (ref 11.2–14.5)
WBC: 2.4 10*3/uL — ABNORMAL LOW (ref 3.9–10.3)
lymph#: 0.8 10*3/uL — ABNORMAL LOW (ref 0.9–3.3)

## 2015-08-09 LAB — COMPREHENSIVE METABOLIC PANEL
ALT: 14 U/L (ref 0–55)
ANION GAP: 10 meq/L (ref 3–11)
AST: 18 U/L (ref 5–34)
Albumin: 3.9 g/dL (ref 3.5–5.0)
Alkaline Phosphatase: 92 U/L (ref 40–150)
BUN: 15 mg/dL (ref 7.0–26.0)
CHLORIDE: 106 meq/L (ref 98–109)
CO2: 28 meq/L (ref 22–29)
CREATININE: 1 mg/dL (ref 0.6–1.1)
Calcium: 9.8 mg/dL (ref 8.4–10.4)
EGFR: 82 mL/min/{1.73_m2} — ABNORMAL LOW (ref >90)
Glucose: 75 mg/dl (ref 70–140)
Potassium: 4 mEq/L (ref 3.5–5.1)
Sodium: 145 mEq/L (ref 136–145)
Total Bilirubin: 0.3 mg/dL (ref 0.20–1.20)
Total Protein: 7.3 g/dL (ref 6.4–8.3)

## 2015-08-18 ENCOUNTER — Ambulatory Visit: Payer: BC Managed Care – PPO | Admitting: Oncology

## 2015-08-18 ENCOUNTER — Other Ambulatory Visit: Payer: BC Managed Care – PPO

## 2015-08-23 ENCOUNTER — Ambulatory Visit (HOSPITAL_BASED_OUTPATIENT_CLINIC_OR_DEPARTMENT_OTHER): Payer: BC Managed Care – PPO

## 2015-08-23 ENCOUNTER — Other Ambulatory Visit (HOSPITAL_BASED_OUTPATIENT_CLINIC_OR_DEPARTMENT_OTHER): Payer: BC Managed Care – PPO

## 2015-08-23 VITALS — BP 100/66 | HR 93 | Temp 98.3°F | Resp 20

## 2015-08-23 DIAGNOSIS — C7951 Secondary malignant neoplasm of bone: Secondary | ICD-10-CM | POA: Diagnosis not present

## 2015-08-23 DIAGNOSIS — C50411 Malignant neoplasm of upper-outer quadrant of right female breast: Secondary | ICD-10-CM

## 2015-08-23 DIAGNOSIS — C7931 Secondary malignant neoplasm of brain: Principal | ICD-10-CM

## 2015-08-23 DIAGNOSIS — C773 Secondary and unspecified malignant neoplasm of axilla and upper limb lymph nodes: Secondary | ICD-10-CM

## 2015-08-23 DIAGNOSIS — C50919 Malignant neoplasm of unspecified site of unspecified female breast: Secondary | ICD-10-CM

## 2015-08-23 DIAGNOSIS — C50911 Malignant neoplasm of unspecified site of right female breast: Secondary | ICD-10-CM

## 2015-08-23 DIAGNOSIS — Z5111 Encounter for antineoplastic chemotherapy: Secondary | ICD-10-CM

## 2015-08-23 LAB — CBC WITH DIFFERENTIAL/PLATELET
BASO%: 0.7 % (ref 0.0–2.0)
BASOS ABS: 0 10*3/uL (ref 0.0–0.1)
EOS%: 2.8 % (ref 0.0–7.0)
Eosinophils Absolute: 0.1 10*3/uL (ref 0.0–0.5)
HEMATOCRIT: 38.9 % (ref 34.8–46.6)
HEMOGLOBIN: 12.9 g/dL (ref 11.6–15.9)
LYMPH#: 0.8 10*3/uL — AB (ref 0.9–3.3)
LYMPH%: 31.5 % (ref 14.0–49.7)
MCH: 31.5 pg (ref 25.1–34.0)
MCHC: 33.2 g/dL (ref 31.5–36.0)
MCV: 94.6 fL (ref 79.5–101.0)
MONO#: 0.2 10*3/uL (ref 0.1–0.9)
MONO%: 9.6 % (ref 0.0–14.0)
NEUT%: 55.4 % (ref 38.4–76.8)
NEUTROS ABS: 1.4 10*3/uL — AB (ref 1.5–6.5)
Platelets: 148 10*3/uL (ref 145–400)
RBC: 4.11 10*6/uL (ref 3.70–5.45)
RDW: 13.3 % (ref 11.2–14.5)
WBC: 2.5 10*3/uL — AB (ref 3.9–10.3)

## 2015-08-23 MED ORDER — FULVESTRANT 250 MG/5ML IM SOLN
500.0000 mg | Freq: Once | INTRAMUSCULAR | Status: AC
  Administered 2015-08-23: 500 mg via INTRAMUSCULAR
  Filled 2015-08-23: qty 10

## 2015-08-23 MED FILL — *IBRANCE 75 MG CAPSULE: 75 | 28 days supply | Qty: 11 | Fill #0

## 2015-08-23 NOTE — Patient Instructions (Signed)

## 2015-08-27 ENCOUNTER — Other Ambulatory Visit: Payer: Self-pay | Admitting: Oncology

## 2015-08-27 DIAGNOSIS — Z1231 Encounter for screening mammogram for malignant neoplasm of breast: Secondary | ICD-10-CM

## 2015-09-03 ENCOUNTER — Other Ambulatory Visit: Payer: BC Managed Care – PPO

## 2015-09-06 ENCOUNTER — Other Ambulatory Visit (HOSPITAL_BASED_OUTPATIENT_CLINIC_OR_DEPARTMENT_OTHER): Payer: BC Managed Care – PPO

## 2015-09-06 ENCOUNTER — Ambulatory Visit
Admission: RE | Admit: 2015-09-06 | Discharge: 2015-09-06 | Disposition: A | Discharge status: Home / Self Care | Payer: BC Managed Care – PPO | Source: Ambulatory Visit | Attending: Radiation Oncology | Admitting: Radiation Oncology

## 2015-09-06 ENCOUNTER — Ambulatory Visit: Payer: Self-pay | Admitting: Radiation Oncology

## 2015-09-06 DIAGNOSIS — C50411 Malignant neoplasm of upper-outer quadrant of right female breast: Secondary | ICD-10-CM | POA: Diagnosis not present

## 2015-09-06 DIAGNOSIS — C7949 Secondary malignant neoplasm of other parts of nervous system: Principal | ICD-10-CM

## 2015-09-06 DIAGNOSIS — C7951 Secondary malignant neoplasm of bone: Secondary | ICD-10-CM

## 2015-09-06 DIAGNOSIS — C7931 Secondary malignant neoplasm of brain: Principal | ICD-10-CM

## 2015-09-06 DIAGNOSIS — C50911 Malignant neoplasm of unspecified site of right female breast: Secondary | ICD-10-CM

## 2015-09-06 LAB — COMPREHENSIVE METABOLIC PANEL
ALBUMIN: 3.8 g/dL (ref 3.5–5.0)
ALK PHOS: 111 U/L (ref 40–150)
ALT: 9 U/L (ref 0–55)
ANION GAP: 10 meq/L (ref 3–11)
AST: 18 U/L (ref 5–34)
BUN: 12.1 mg/dL (ref 7.0–26.0)
CALCIUM: 9.9 mg/dL (ref 8.4–10.4)
CO2: 29 meq/L (ref 22–29)
Chloride: 106 mEq/L (ref 98–109)
Creatinine: 0.9 mg/dL (ref 0.6–1.1)
EGFR: 85 mL/min/{1.73_m2} — AB (ref >90)
Glucose: 72 mg/dl (ref 70–140)
Potassium: 4.2 mEq/L (ref 3.5–5.1)
Sodium: 145 mEq/L (ref 136–145)
TOTAL PROTEIN: 7.6 g/dL (ref 6.4–8.3)

## 2015-09-06 LAB — CBC WITH DIFFERENTIAL/PLATELET
BASO%: 0.5 % (ref 0.0–2.0)
Basophils Absolute: 0 10*3/uL (ref 0.0–0.1)
EOS ABS: 0.1 10*3/uL (ref 0.0–0.5)
EOS%: 3.2 % (ref 0.0–7.0)
HEMATOCRIT: 38.4 % (ref 34.8–46.6)
HGB: 13.2 g/dL (ref 11.6–15.9)
LYMPH%: 46.8 % (ref 14.0–49.7)
MCH: 32 pg (ref 25.1–34.0)
MCHC: 34.4 g/dL (ref 31.5–36.0)
MCV: 93.2 fL (ref 79.5–101.0)
MONO#: 0.2 10*3/uL (ref 0.1–0.9)
MONO%: 8 % (ref 0.0–14.0)
NEUT%: 41.5 % (ref 38.4–76.8)
NEUTROS ABS: 0.8 10*3/uL — AB (ref 1.5–6.5)
PLATELETS: 151 10*3/uL (ref 145–400)
RBC: 4.12 10*6/uL (ref 3.70–5.45)
RDW: 12.3 % (ref 11.2–14.5)
WBC: 1.9 10*3/uL — AB (ref 3.9–10.3)
lymph#: 0.9 10*3/uL (ref 0.9–3.3)

## 2015-09-06 MED ORDER — GADOBENATE DIMEGLUMINE 529 MG/ML IV SOLN
10.0000 mL | Freq: Once | INTRAVENOUS | Status: AC | PRN
  Administered 2015-09-06: 10 mL via INTRAVENOUS

## 2015-09-07 ENCOUNTER — Telehealth: Payer: Self-pay | Admitting: *Deleted

## 2015-09-08 ENCOUNTER — Ambulatory Visit
Admission: RE | Admit: 2015-09-08 | Discharge: 2015-09-08 | Disposition: A | Discharge status: Home / Self Care | Payer: BC Managed Care – PPO | Source: Ambulatory Visit | Attending: Radiation Oncology | Admitting: Radiation Oncology

## 2015-09-08 ENCOUNTER — Encounter: Payer: Self-pay | Admitting: Radiation Oncology

## 2015-09-08 VITALS — BP 117/80 | HR 78 | Temp 98.2°F | Resp 20 | Ht 68.0 in | Wt 123.8 lb

## 2015-09-08 DIAGNOSIS — Z170 Estrogen receptor positive status [ER+]: Secondary | ICD-10-CM | POA: Insufficient documentation

## 2015-09-08 DIAGNOSIS — C50911 Malignant neoplasm of unspecified site of right female breast: Secondary | ICD-10-CM | POA: Insufficient documentation

## 2015-09-08 DIAGNOSIS — C7931 Secondary malignant neoplasm of brain: Secondary | ICD-10-CM | POA: Diagnosis present

## 2015-09-08 DIAGNOSIS — C7949 Secondary malignant neoplasm of other parts of nervous system: Secondary | ICD-10-CM

## 2015-09-08 NOTE — Progress Notes (Signed)
Follow up s/p SRS Brain 04/11/14, sees DR. Tatter 09/24/15 at [facility], Dr. Julien Nordmann 10/18/15, Zometa scheduled 10/18/15, Mammogram 09/20/15, no c/o head aches, nausea, Dizymes or pain, appetite good, mild fatigue at times  MRI Brain results from 09/06/15 here to discuss BP 117/80 (BP Location: Left Arm, Patient Position: Sitting, Cuff Size: Normal)   Pulse 78   Temp 98.2 F (36.8 C) (Oral)   Resp 20   Ht 5\' 8"  (1.727 m)   Wt 123 lb 12.8 oz (56.2 kg)   LMP 07/02/2011   SpO2 100% Comment: room air  BMI 18.82 kg/m   Wt Readings from Last 3 Encounters:  09/08/15 123 lb 12.8 oz (56.2 kg)  07/26/15 125 lb 1.6 oz (56.7 kg)  06/09/15 125 lb 4.8 oz (56.8 kg)

## 2015-09-08 NOTE — Progress Notes (Signed)
Radiation Oncology         [phone] ________________________________  Name: Carolyn [name] MRN: [id number]  Date: 09/08/2015  DOB: 09-10-1970  Follow-Up Visit Note  CC: [name], MD  [name], MD  Diagnosis:  History of metastatic ER PR postive, HER-2 negative right invasive carcinoma of the breast with metastatic disease to the brain  Interval Since Last Radiation:  16 months  04/29/2014: SRS to left parietal lesion to a dose of 20 gray  Narrative:  The patient returns today for routine follow-up. This is a young patient who was diagnosed with metastatic right breast cancer to the brain who underwent stereotactic radiosurgery to the brain lesion in 2016. She continued to have edema and subsequently was referred to Dr. Mort Sawyers at [facility]. She underwent LITT on 11/12/1. She has been followed closely since. She sees Dr. Salomon Fick 09/24/15 at [facility], Dr. Julien Nordmann 10/18/15, Zometa scheduled 10/18/15, Mammogram 09/20/15. She denies headaches, nausea, dizziness, or pain. She complains of mild fatigue intermittently. She states she has a good appetite.                              ALLERGIES:  is allergic to allegra [fexofenadine] and shellfish allergy.  Meds: Current Outpatient Prescriptions  Medication Sig Dispense Refill  . acetaminophen (TYLENOL) 325 MG tablet Take 2 tablets (650 mg total) by mouth every 6 (six) hours as needed for mild pain, moderate pain, fever or headache.    . Calcium Carbonate-Vit D-Min (CALCIUM 1200 PO) Take 1 tablet by mouth 2 (two) times daily.    . Cholecalciferol (VITAMIN D3) 2000 UNITS TABS Take 2,000 Units by mouth daily.    . Multiple Vitamin (MULTIVITAMIN) tablet Take 1 tablet by mouth daily.    . palbociclib (IBRANCE) 75 MG capsule TAKE 1 CAPSULE BY MOUTH every other day for 21 days (total 11 capsules) then off a week (Patient not taking: Reported on 09/08/2015) 11 capsule 6   No current facility-administered  medications for this encounter.     Physical Findings: The patient is in no acute distress. Patient is alert and oriented.  height is '5\' 8"'  (1.727 m) and weight is 123 lb 12.8 oz (56.2 kg). Her oral temperature is 98.2 F (36.8 C). Her blood pressure is 117/80 and her pulse is 78. Her respiration is 20 and oxygen saturation is 100%. .     Lab Findings: Lab Results  Component Value Date   WBC 1.9 (L) 09/06/2015   HGB 13.2 09/06/2015   HCT 38.4 09/06/2015   MCV 93.2 09/06/2015   PLT 151 09/06/2015     Radiographic Findings: Mr Jeri Cos GY Contrast  Result Date: 09/06/2015 CLINICAL DATA:  45 y/o F; SRS protocol. Metastatic breast cancer. Left temporal bone region irradiated 4/16 and surgery 11/2014. EXAM: MRI HEAD WITHOUT AND WITH CONTRAST TECHNIQUE: Multiplanar, multiecho pulse sequences of the brain and surrounding structures were obtained without and with intravenous contrast. CONTRAST:  41m MULTIHANCE GADOBENATE DIMEGLUMINE 529 MG/ML IV SOLN COMPARISON:  06/08/2015, 03/02/2015, 10/08/2014, 06/26/2014 MRI of brain. FINDINGS: Brain:  No diffusion restriction to suggest acute infarct. Enhancing lesion in the left parietal periventricular white matter has decreased in size measuring 16 x 16 mm in greatest axial dimensions measured in a similar fashion. The lesion demonstrates susceptibility and areas of T1 shortening compatible with internal hemorrhage and mild enhancement as well as cystic change. There is no significant  peripheral T2 FLAIR hyperintense signal abnormality indicate the presence of vasogenic edema. Minimal local mass effect. Linear susceptibility leaning to the lesion traversing the parietal lobe is compatible with laser ablation tract. Small right frontal developmental venous anomaly. No new focus of abnormal enhancement to suggest new metastatic disease to the brain. Extra-axial space: No hydrocephalus. No extra-axial collection is identified. Proximal intracranial flow voids  are maintained. Other: Mild ethmoid sinus mucosal thickening. No abnormal signal of the mastoid air cells. Orbits are unremarkable. Stable intermediate T1 and T2 signal enhancing lesion within the left temporal bone posterior to the labyrinth measuring 10 x 17 mm axially (AP x ML). Small focus of enhancement at the base of the left mandibular condyles is stable may represent an additional metastatic focus. IMPRESSION: 1. Mild interval decrease in size of left parietal periventricular white matter metastasis with cystic change and internal hemorrhage compatible with posttreatment changes. 2. No evidence for new metastasis to the brain parenchyma. 3. 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 4. Stable nonspecific focus of enhancement in the neck of left mandibular condyle from 2016, possibly metastasis. Electronically Signed   By: Kristine Garbe M.D.   On: 09/06/2015 18:34    Impression: Patient clinically is doing well with no worrisome finding on recent MRI of brain.  Plan:  Repeat brain MRI in three months with a follow-up afterwards.  The patient was seen today for 15 minutes, with the majority of the time spent counseling the patient on his diagnosis of cancer and coordinating his care.    [name], M.D., Ph.D.  This document serves as a record of services personally performed by Kyung Rudd, MD. It was created on his behalf by Bethann Humble, a trained medical scribe. The creation of this record is based on the scribe's personal observations and the provider's statements to them. This document has been checked and approved by the attending provider.

## 2015-09-16 ENCOUNTER — Other Ambulatory Visit (HOSPITAL_BASED_OUTPATIENT_CLINIC_OR_DEPARTMENT_OTHER): Payer: BC Managed Care – PPO

## 2015-09-16 DIAGNOSIS — C50911 Malignant neoplasm of unspecified site of right female breast: Secondary | ICD-10-CM

## 2015-09-16 DIAGNOSIS — C7931 Secondary malignant neoplasm of brain: Principal | ICD-10-CM

## 2015-09-16 DIAGNOSIS — C50411 Malignant neoplasm of upper-outer quadrant of right female breast: Secondary | ICD-10-CM

## 2015-09-16 DIAGNOSIS — C7951 Secondary malignant neoplasm of bone: Secondary | ICD-10-CM

## 2015-09-16 LAB — CBC WITH DIFFERENTIAL/PLATELET
BASO%: 0.7 % (ref 0.0–2.0)
Basophils Absolute: 0 10*3/uL (ref 0.0–0.1)
EOS%: 2.7 % (ref 0.0–7.0)
Eosinophils Absolute: 0.1 10*3/uL (ref 0.0–0.5)
HEMATOCRIT: 38.9 % (ref 34.8–46.6)
HEMOGLOBIN: 13.2 g/dL (ref 11.6–15.9)
LYMPH#: 0.8 10*3/uL — AB (ref 0.9–3.3)
LYMPH%: 32.6 % (ref 14.0–49.7)
MCH: 31.7 pg (ref 25.1–34.0)
MCHC: 33.9 g/dL (ref 31.5–36.0)
MCV: 93.5 fL (ref 79.5–101.0)
MONO#: 0.3 10*3/uL (ref 0.1–0.9)
MONO%: 12.4 % (ref 0.0–14.0)
NEUT%: 51.6 % (ref 38.4–76.8)
NEUTROS ABS: 1.3 10*3/uL — AB (ref 1.5–6.5)
PLATELETS: 149 10*3/uL (ref 145–400)
RBC: 4.17 10*6/uL (ref 3.70–5.45)
RDW: 13.3 % (ref 11.2–14.5)
WBC: 2.5 10*3/uL — AB (ref 3.9–10.3)

## 2015-09-17 ENCOUNTER — Telehealth: Payer: Self-pay

## 2015-09-17 NOTE — Telephone Encounter (Signed)
Pt is not taking ibrance at present. ANC is 1.3. Pt instructed to hold ibrance for 1 more week. Will recheck CBC. inbasket sent

## 2015-09-20 ENCOUNTER — Ambulatory Visit: Payer: BC Managed Care – PPO

## 2015-09-20 ENCOUNTER — Ambulatory Visit
Admission: RE | Admit: 2015-09-20 | Discharge: 2015-09-20 | Disposition: A | Discharge status: Home / Self Care | Payer: BC Managed Care – PPO | Source: Ambulatory Visit | Attending: Oncology | Admitting: Oncology

## 2015-09-20 ENCOUNTER — Other Ambulatory Visit: Payer: BC Managed Care – PPO

## 2015-09-20 DIAGNOSIS — Z1231 Encounter for screening mammogram for malignant neoplasm of breast: Secondary | ICD-10-CM

## 2015-09-23 ENCOUNTER — Other Ambulatory Visit (HOSPITAL_BASED_OUTPATIENT_CLINIC_OR_DEPARTMENT_OTHER): Payer: BC Managed Care – PPO

## 2015-09-23 ENCOUNTER — Ambulatory Visit (HOSPITAL_BASED_OUTPATIENT_CLINIC_OR_DEPARTMENT_OTHER): Payer: BC Managed Care – PPO

## 2015-09-23 VITALS — BP 115/65 | HR 90 | Temp 97.8°F | Resp 18

## 2015-09-23 DIAGNOSIS — C50411 Malignant neoplasm of upper-outer quadrant of right female breast: Secondary | ICD-10-CM

## 2015-09-23 DIAGNOSIS — C7951 Secondary malignant neoplasm of bone: Secondary | ICD-10-CM

## 2015-09-23 DIAGNOSIS — C50911 Malignant neoplasm of unspecified site of right female breast: Secondary | ICD-10-CM

## 2015-09-23 DIAGNOSIS — C7931 Secondary malignant neoplasm of brain: Secondary | ICD-10-CM

## 2015-09-23 DIAGNOSIS — C50919 Malignant neoplasm of unspecified site of unspecified female breast: Secondary | ICD-10-CM

## 2015-09-23 DIAGNOSIS — C773 Secondary and unspecified malignant neoplasm of axilla and upper limb lymph nodes: Secondary | ICD-10-CM

## 2015-09-23 DIAGNOSIS — Z5111 Encounter for antineoplastic chemotherapy: Secondary | ICD-10-CM

## 2015-09-23 LAB — CBC WITH DIFFERENTIAL/PLATELET
BASO%: 0.3 % (ref 0.0–2.0)
BASOS ABS: 0 10*3/uL (ref 0.0–0.1)
EOS%: 2.8 % (ref 0.0–7.0)
Eosinophils Absolute: 0.1 10*3/uL (ref 0.0–0.5)
HEMATOCRIT: 37.5 % (ref 34.8–46.6)
HGB: 12.9 g/dL (ref 11.6–15.9)
LYMPH#: 1 10*3/uL (ref 0.9–3.3)
LYMPH%: 31 % (ref 14.0–49.7)
MCH: 31.7 pg (ref 25.1–34.0)
MCHC: 34.4 g/dL (ref 31.5–36.0)
MCV: 92.1 fL (ref 79.5–101.0)
MONO#: 0.3 10*3/uL (ref 0.1–0.9)
MONO%: 10.5 % (ref 0.0–14.0)
NEUT#: 1.8 10*3/uL (ref 1.5–6.5)
NEUT%: 55.4 % (ref 38.4–76.8)
Platelets: 153 10*3/uL (ref 145–400)
RBC: 4.07 10*6/uL (ref 3.70–5.45)
RDW: 12.3 % (ref 11.2–14.5)
WBC: 3.2 10*3/uL — ABNORMAL LOW (ref 3.9–10.3)

## 2015-09-23 MED ORDER — FULVESTRANT 250 MG/5ML IM SOLN
500.0000 mg | Freq: Once | INTRAMUSCULAR | Status: AC
  Administered 2015-09-23: 500 mg via INTRAMUSCULAR
  Filled 2015-09-23: qty 10

## 2015-09-23 NOTE — Patient Instructions (Signed)

## 2015-09-24 ENCOUNTER — Telehealth: Payer: Self-pay | Admitting: *Deleted

## 2015-09-24 MED FILL — *IBRANCE 75 MG CAPSULE: 75 | 28 days supply | Qty: 11 | Fill #1

## 2015-09-24 NOTE — Telephone Encounter (Signed)
This RN spoke with pt per her lab on 9/14 with ANC of 1.8.  Carolyn [name] will restart Ibrance 75 mg at dose of 1 tab every other day.  Next lab scheduled on 10/18/2015 prior to next start date.

## 2015-10-18 ENCOUNTER — Ambulatory Visit: Payer: BC Managed Care – PPO

## 2015-10-18 ENCOUNTER — Ambulatory Visit (HOSPITAL_BASED_OUTPATIENT_CLINIC_OR_DEPARTMENT_OTHER): Payer: BC Managed Care – PPO

## 2015-10-18 ENCOUNTER — Other Ambulatory Visit (HOSPITAL_BASED_OUTPATIENT_CLINIC_OR_DEPARTMENT_OTHER): Payer: BC Managed Care – PPO

## 2015-10-18 ENCOUNTER — Ambulatory Visit (HOSPITAL_BASED_OUTPATIENT_CLINIC_OR_DEPARTMENT_OTHER): Payer: BC Managed Care – PPO | Admitting: Oncology

## 2015-10-18 VITALS — BP 116/63 | HR 79 | Temp 98.4°F | Resp 18 | Ht 68.0 in | Wt 126.9 lb

## 2015-10-18 DIAGNOSIS — C50411 Malignant neoplasm of upper-outer quadrant of right female breast: Secondary | ICD-10-CM | POA: Diagnosis not present

## 2015-10-18 DIAGNOSIS — C773 Secondary and unspecified malignant neoplasm of axilla and upper limb lymph nodes: Secondary | ICD-10-CM

## 2015-10-18 DIAGNOSIS — C7951 Secondary malignant neoplasm of bone: Secondary | ICD-10-CM

## 2015-10-18 DIAGNOSIS — Z170 Estrogen receptor positive status [ER+]: Secondary | ICD-10-CM

## 2015-10-18 DIAGNOSIS — C7931 Secondary malignant neoplasm of brain: Principal | ICD-10-CM

## 2015-10-18 DIAGNOSIS — C50911 Malignant neoplasm of unspecified site of right female breast: Secondary | ICD-10-CM

## 2015-10-18 DIAGNOSIS — Z5111 Encounter for antineoplastic chemotherapy: Secondary | ICD-10-CM | POA: Diagnosis not present

## 2015-10-18 DIAGNOSIS — D702 Other drug-induced agranulocytosis: Secondary | ICD-10-CM

## 2015-10-18 DIAGNOSIS — C50919 Malignant neoplasm of unspecified site of unspecified female breast: Secondary | ICD-10-CM

## 2015-10-18 LAB — COMPREHENSIVE METABOLIC PANEL
ALBUMIN: 3.7 g/dL (ref 3.5–5.0)
ALK PHOS: 133 U/L (ref 40–150)
ALT: 10 U/L (ref 0–55)
ANION GAP: 10 meq/L (ref 3–11)
AST: 16 U/L (ref 5–34)
BUN: 14.9 mg/dL (ref 7.0–26.0)
CALCIUM: 9.6 mg/dL (ref 8.4–10.4)
CHLORIDE: 107 meq/L (ref 98–109)
CO2: 27 mEq/L (ref 22–29)
CREATININE: 0.9 mg/dL (ref 0.6–1.1)
EGFR: >90 mL/min/{1.73_m2} (ref >90)
Glucose: 81 mg/dl (ref 70–140)
Potassium: 3.8 mEq/L (ref 3.5–5.1)
Sodium: 144 mEq/L (ref 136–145)
Total Bilirubin: 0.33 mg/dL (ref 0.20–1.20)
Total Protein: 7.1 g/dL (ref 6.4–8.3)

## 2015-10-18 LAB — CBC WITH DIFFERENTIAL/PLATELET
BASO%: 0.5 % (ref 0.0–2.0)
Basophils Absolute: 0 10*3/uL (ref 0.0–0.1)
EOS ABS: 0.1 10*3/uL (ref 0.0–0.5)
EOS%: 2.7 % (ref 0.0–7.0)
HEMATOCRIT: 36.3 % (ref 34.8–46.6)
HEMOGLOBIN: 12.6 g/dL (ref 11.6–15.9)
LYMPH%: 45.2 % (ref 14.0–49.7)
MCH: 31.5 pg (ref 25.1–34.0)
MCHC: 34.7 g/dL (ref 31.5–36.0)
MCV: 90.8 fL (ref 79.5–101.0)
MONO#: 0.2 10*3/uL (ref 0.1–0.9)
MONO%: 7.3 % (ref 0.0–14.0)
NEUT#: 1 10*3/uL — ABNORMAL LOW (ref 1.5–6.5)
NEUT%: 44.3 % (ref 38.4–76.8)
PLATELETS: 112 10*3/uL — AB (ref 145–400)
RBC: 4 10*6/uL (ref 3.70–5.45)
RDW: 12.5 % (ref 11.2–14.5)
WBC: 2.2 10*3/uL — AB (ref 3.9–10.3)
lymph#: 1 10*3/uL (ref 0.9–3.3)

## 2015-10-18 MED ORDER — ZOLEDRONIC ACID 4 MG/100ML IV SOLN
4.0000 mg | Freq: Once | INTRAVENOUS | Status: AC
  Administered 2015-10-18: 4 mg via INTRAVENOUS
  Filled 2015-10-18: qty 100

## 2015-10-18 MED ORDER — FULVESTRANT 250 MG/5ML IM SOLN
500.0000 mg | Freq: Once | INTRAMUSCULAR | Status: AC
  Administered 2015-10-18: 500 mg via INTRAMUSCULAR
  Filled 2015-10-18: qty 10

## 2015-10-18 MED ORDER — SODIUM CHLORIDE 0.9 % IV SOLN
INTRAVENOUS | Status: DC
  Administered 2015-10-18: 10:00:00 via INTRAVENOUS

## 2015-10-18 NOTE — Progress Notes (Signed)
[place]  Telephone:([phone] Fax:(336) [phone]     ID: Carolyn [name] DOB: [birth year]  MR#: [id number]  SNK#:[id number]  Patient Care Team: [name], MD as PCP - General (Family Medicine) Chauncey Cruel, MD as Consulting Physician (Oncology) Kyung Rudd, MD as Consulting Physician (Radiation Oncology) Kathie Rhodes, DMD as Physician Assistant (Dentistry) Burnell Blanks. 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 She is receiving fulvestrant every 28 days. She tolerates that well. She is also on palpable palbociclib, and we have had to drop her dose repeatedly because of counts. She is currently on 75 mg every other day. She tells me she tolerates it better, with less problems with fatigue and no bruising issues. Finally she is also on zolendronate, which she receives every 3 months. She has been working closely with her dentist and no extractions are implants are planned.  Since her last visit here she also had her left mammography 09/20/2015. This continues to show dense breasts, but no suspicious findings otherwise. Repeat brain MRI 09/06/2015 showed no evidence of disease progression.   REVIEW OF SYSTEMS: Carolyn [name] continues to work full-time. She enjoys her job and she says she is doing well. She continues to do her physical therapy on her own and she walks with a cane and tries to walk as much as she can. She still has a sensation of tightness in her left right leg and sometimes in her hands as well. She has problems with hot flashes at times. There have been no falls, unusual or persistent headaches, visual changes, nausea, or vomiting. She has had no intercurrent fevers.  Otherwise a detailed review of systems today was stable  BREAST CANCER HISTORY: From Carolyn [name]'s of original intake node 05/31/2011:  "Carolyn [name] is a 45 y.o. female. Without significant past medical history. She underwent a mammogram that showed calcifications measuring 5 cm on the right breast. She then went on To have an ultrasound which showed the area to measure 2.9 cm. She was also found to have a right axillary lymph node that was suspicious for a malignancy. She had a biopsy of the calcifications that showed high-grade ductal carcinoma in situ. 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 In the lymph node biopsy there was no lymphatic tissue seen and was felt that the node was replaced by tumor. Patient went on to have an MRI of the bilateral breasts performed on evening of 05/30/2011. The MRI showed in the right breast 5 x 3 x 4.5 cm mass abutting the chest wall. About 7 cm away from this there was another mass in the upper inner quadrant that measured 1.5 x 1.7 x 1.5 cm. On the contralateral breast, that is the left breast a 2 cm area suspicious enhancement was noted also this up to date has not been biopsied and arrangements are being made for the biopsy to be performed. In this side there were no suspicious lymph nodes. The prognostic panel is pending. Patient is otherwise without any complaints. "  METASTATIC DISEASE: From the earlier summary note:  Carolyn [name] noted some strange feelings around her umbilicus 93/79/0240. This felt like an area of numbness. Over the next 2 days she noted some leg weakness  and difficulty walking, so she presented to the ED 04/12/2014. MRI of the thoraco-lumbar spine was obtained 04/12/2014 showing multiple bone lesions and compression fracture at T8 with retropulsion and cord compression. On 04/13/2014 she underwent Laminectomy T8 decompression of spinal cord posterior fixation from T6-T10 with pedicle screws and rods  posterior arthrodesis with allograft. The pathology from this procedure ([id number] [id number]) showed metastatic adenocarcinoma which was estrogen receptor 69% positive, with moderate staining intensity, progesterone receptor negative. HER-2 could not be obtained.  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 There were nonspecific L temporal bone changes and also possible involvement of the clivus and calvarium, but no other parenchymal brain lesions. Further staging studies included a bone scan, which failed to show the lytic lesion seen on other scans, and CTs of the chest, abdomen and pelvis on 06/08/2011, which showed very small right lung and left liver lesions which will require follow-up  Her subsequent history is as detailed below   PAST MEDICAL HISTORY: Past Medical History:  Diagnosis Date  . Breast cancer ([place]) 09/2011   invasive ductal carcinoma metastatic ca in 3/14 lymph nodes  . History of chemotherapy 06/27/11 -09/04/11   neoadjuvant  . Hx of radiation therapy 11/08/11 -12/26/11   right chest wall/supraclav fossa, right scar  . S/P radiation therapy 05/08/14   SRS brain    PAST SURGICAL HISTORY: Past Surgical History:  Procedure Laterality Date  . brain surgery-LITT Left 11/20/14   LITT procedure for Lt brain met.  Marland Kitchen BREAST BIOPSY     Left  . CHOLECYSTECTOMY N/A 03/01/2014   Procedure: LAPAROSCOPIC CHOLECYSTECTOMY;  Surgeon: Leighton Ruff, MD;  Location: [facility];  Service: General;  Laterality: N/A;  . LAMINECTOMY N/A 04/13/2014   Procedure: THORACIC LAMINECTOMY WITH FIXATION THORACIC SIX-THORACIC TEN FUSION;  Surgeon: Kristeen Miss, MD;  Location: [facility];  Service: Neurosurgery;  Laterality: N/A;  . MODIFIED MASTECTOMY  10/03/2011   Procedure: MODIFIED MASTECTOMY;  Surgeon: Haywood Lasso, MD;  Location: [facility];  Service: General;  Laterality: Right;  .  PORT-A-CATH REMOVAL  01/30/2012   Procedure: MINOR REMOVAL PORT-A-CATH;  Surgeon: Haywood Lasso, MD;  Location: [facility];  Service: General;  Laterality: Left;  . PORTACATH PLACEMENT  06/20/2011   Procedure: INSERTION PORT-A-CATH;  Surgeon: Haywood Lasso, MD;  Location: [facility];  Service: General;  Laterality: N/A;  . ROBOTIC ASSISTED TOTAL HYSTERECTOMY WITH BILATERAL SALPINGO OOPHERECTOMY Bilateral 12/23/2012   Procedure: ROBOTIC ASSISTED TOTAL HYSTERECTOMY WITH BILATERAL SALPINGO OOPHORECTOMY;  Surgeon: Marvene Staff, MD;  Location: [facility];  Service: Gynecology;  Laterality: Bilateral;  . WISDOM TOOTH EXTRACTION      FAMILY HISTORY Family History  Problem Relation Age of Onset  . Breast cancer Maternal Aunt 66  . Pancreatic cancer Maternal Grandfather   . Pancreatic cancer Maternal Aunt     died in her 33s  . Leukemia Maternal Aunt     died in her 11s  . Ovarian cancer Cousin 53    maternal cousin   the patient's father is alive, currently 82 years old. The patient's mother died from complications of diabetes at the age of 6. The patient had no brothers, 4 sisters. One sister has died from congestive heart failure. The patient's mother is one of 5 sisters. One of them was diagnosed with breast cancer the age of 32. Also a cousin  on the maternal side it was diagnosed with ovarian cancer. This was approximately age 31. There is also colon cancer in the family. The patient has had extensive genetic testing summarized below. She is however BRCA negative  GYNECOLOGIC HISTORY:  Patient's last menstrual period was 07/02/2011. Menarche age 74, she is GX P0. She underwent total hysterectomy with bilateral salpingo-oophorectomy December 2014.  SOCIAL HISTORY:  Christon works at [employer] for [employer]. She mostly deals with government documents. She is single, lives by herself, with no pets. She attends [employer] locally.    ADVANCED DIRECTIVES: Not in place. The  patient has a documents and intends to name her sister Ofelia Podolski as healthcare power of attorney. Joelene Millin lives in [place] and can be reached at [place]: Social History  Substance Use Topics  . Smoking status: Never Smoker  . Smokeless tobacco: Never Used  . Alcohol use No     Colonoscopy:  PAP:  Bone density:  Lipid panel:  Allergies  Allergen Reactions  . Allegra [Fexofenadine] Hives    Abdomen only  . Shellfish Allergy Hives    Abdomen only    Current Outpatient Prescriptions  Medication Sig Dispense Refill  . acetaminophen (TYLENOL) 325 MG tablet Take 2 tablets (650 mg total) by mouth every 6 (six) hours as needed for mild pain, moderate pain, fever or headache.    . Calcium Carbonate-Vit D-Min (CALCIUM 1200 PO) Take 1 tablet by mouth 2 (two) times daily.    . Cholecalciferol (VITAMIN D3) 2000 UNITS TABS Take 2,000 Units by mouth daily.    . Multiple Vitamin (MULTIVITAMIN) tablet Take 1 tablet by mouth daily.    . palbociclib (IBRANCE) 75 MG capsule TAKE 1 CAPSULE BY MOUTH every other day for 21 days (total 11 capsules) then off a week (Patient not taking: Reported on 09/08/2015) 11 capsule 6   No current facility-administered medications for this visit.     OBJECTIVE: Middle-aged [place] American woman Who appears stated age 77:   10/18/15 0904  BP: 116/63  Pulse: 79  Resp: 18  Temp: 98.4 F (36.9 C)     Body mass index is 19.3 kg/m.    ECOG FS:1 - Symptomatic but completely ambulatory  Sclerae unicteric, pupils round and equal Oropharynx clear and moist-- no thrush or other lesions No cervical or supraclavicular adenopathy Lungs no rales or rhonchi Heart regular rate and rhythm Abd soft, nontender, positive bowel sounds MSK no focal spinal tenderness, no upper extremity lymphedema Neuro: nonfocal, well oriented, appropriate affect Breasts: The right breast is status post mastectomy and radiation. There is no evidence of  local recurrence. The right axilla is benign. The left breast is unremarkable.    LAB RESULTS: Results for KIEARA, SCHWARK (MRN [id number]) as of 10/18/2015 06:59  Ref. Range 08/09/2015 07:57 08/23/2015 08:05 09/06/2015 08:12 09/16/2015 09:53 09/23/2015 08:45  NEUT# Latest Ref Range: 1.5 - 6.5 10e3/uL 1.2 (L) 1.4 (L) 0.8 (L) 1.3 (L) 1.8    CMP     Component Value Date/Time   NA 145 09/06/2015 0812   K 4.2 09/06/2015 0812   CL 103 04/21/2014 0910   CL 102 01/16/2012 0804   CO2 29 09/06/2015 0812   GLUCOSE 72 09/06/2015 0812   GLUCOSE 92 01/16/2012 0804   BUN 12.1 09/06/2015 0812   CREATININE 0.9 09/06/2015 0812   CALCIUM 9.9 09/06/2015 0812   PROT 7.6 09/06/2015 0812   ALBUMIN 3.8 09/06/2015 0812   AST 18 09/06/2015 9024  ALT 9 09/06/2015 0812   ALKPHOS 111 09/06/2015 0812   BILITOT <0.30 09/06/2015 0812   GFRNONAA >90 04/21/2014 0910   GFRAA >90 04/21/2014 0910    I No results found for: SPEP  Lab Results  Component Value Date   WBC 2.2 (L) 10/18/2015   NEUTROABS 1.0 (L) 10/18/2015   HGB 12.6 10/18/2015   HCT 36.3 10/18/2015   MCV 90.8 10/18/2015   PLT 112 (L) 10/18/2015      Chemistry      Component Value Date/Time   NA 145 09/06/2015 0812   K 4.2 09/06/2015 0812   CL 103 04/21/2014 0910   CL 102 01/16/2012 0804   CO2 29 09/06/2015 0812   BUN 12.1 09/06/2015 0812   CREATININE 0.9 09/06/2015 0812      Component Value Date/Time   CALCIUM 9.9 09/06/2015 0812   ALKPHOS 111 09/06/2015 0812   AST 18 09/06/2015 0812   ALT 9 09/06/2015 0812   BILITOT <0.30 09/06/2015 0812       Lab Results  Component Value Date   LABCA2 24 05/31/2011    No components found for: [id number]  No results for input(s): INR in the last 168 hours.  Urinalysis No results found for: COLORURINE  STUDIES: CLINICAL DATA:  45 y/o F; SRS protocol. Metastatic breast cancer. Left temporal bone region irradiated 4/16 and surgery 11/2014.  EXAM: MRI HEAD WITHOUT AND WITH  CONTRAST  TECHNIQUE: Multiplanar, multiecho pulse sequences of the brain and surrounding structures were obtained without and with intravenous contrast.  CONTRAST:  76m MULTIHANCE GADOBENATE DIMEGLUMINE 529 MG/ML IV SOLN  COMPARISON:  06/08/2015, 03/02/2015, 10/08/2014, 06/26/2014 MRI of brain.  FINDINGS: Brain:  No diffusion restriction to suggest acute infarct.  Enhancing lesion in the left parietal periventricular white matter has decreased in size measuring 16 x 16 mm in greatest axial dimensions measured in a similar fashion. The lesion demonstrates susceptibility and areas of T1 shortening compatible with internal hemorrhage and mild enhancement as well as cystic change. There is no significant peripheral T2 FLAIR hyperintense signal abnormality indicate the presence of vasogenic edema. Minimal local mass effect. Linear susceptibility leaning to the lesion traversing the parietal lobe is compatible with laser ablation tract.  Small right frontal developmental venous anomaly. No new focus of abnormal enhancement to suggest new metastatic disease to the brain.  Extra-axial space: No hydrocephalus. No extra-axial collection is identified. Proximal intracranial flow voids are maintained.  Other: Mild ethmoid sinus mucosal thickening. No abnormal signal of the mastoid air cells. Orbits are unremarkable. Stable intermediate T1 and T2 signal enhancing lesion within the left temporal bone posterior to the labyrinth measuring 10 x 17 mm axially (AP x ML). Small focus of enhancement at the base of the left mandibular condyles is stable may represent an additional metastatic focus.  IMPRESSION: 1. Mild interval decrease in size of left parietal periventricular white matter metastasis with cystic change and internal hemorrhage compatible with posttreatment changes. 2. No evidence for new metastasis to the brain parenchyma. 3. 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 4. Stable nonspecific focus of enhancement in the neck of left mandibular condyle from 2016, possibly metastasis.   Electronically Signed   By: LKristine GarbeM.D.   On: 09/06/2015 18:34    Mm Screening Breast Tomo Uni L  Result Date: 09/20/2015 CLINICAL DATA:  Screening. EXAM: 2D DIGITAL SCREENING UNILATERAL LEFT MAMMOGRAM WITH CAD AND ADJUNCT TOMO  COMPARISON:  Previous exam(s). ACR Breast Density Category d: The breast tissue is extremely dense, which lowers the sensitivity of mammography. FINDINGS: The patient has had a right mastectomy. There are no findings suspicious for malignancy. Images were processed with CAD. IMPRESSION: No mammographic evidence of malignancy. A result letter of this screening mammogram will be mailed directly to the patient. RECOMMENDATION: Screening mammogram in one year.  (Code:SM-L-35M) BI-RADS CATEGORY  1: Negative. Electronically Signed   By: Dorise Bullion III M.D   On: 09/20/2015 17:51    ASSESSMENT: 45 y.o. BRCA negative [place] woman status post right breast upper outer quadrant and right axillary lymph node biopsy 05/18/2011, both positive for an invasive ductal carcinoma, high-grade, clinicallyT2 N1-2 or stage IIB/IIIA,  estrogen receptor 100% positive, progesterone receptor 87% positive, with an MIB-1 of 14% and no HER-2 amplification ([id number]).  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   (3)Treated neoadjuvantly with cyclophosphamide and docetaxel x4 completed 08/29/2011.  (4) status post right modified  radical mastectomy 10/03/2011 showing a residual pT1c pN1a (3/18 lymph nodes positive) invasive ductal carcinoma, grade 1,estrogen receptor 100% positive, progesterone receptor negative, with no HER-2 amplification ([id number])  (5) adjuvant radiation to the right chest wall, right supraclavicular fossa and right scar completed 12/26/2011  (6) tamoxifen started January 2014-- discontinued April 2016 with evidence of metastatic disease  (7) status post total hysterectomy with bilateral salpingo-oophorectomy 12/23/2012 with benign pathology ([id number])  METASTATIC DISEASE 04/13/2014 (8) presented with T8 cord compression and underwent laminectomy 04/13/2014 with T8 decompression of spinal cord, posterior fixation from T6-T10 with pedicle screws and rods, posterior arthrodesis with allograft. 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 ExacTrac Snap verification was performed for each couch angle.   (b) radiation to the T-spine completed 05/08/2014.  (c) [facility]  (11) started fulvestrant 05/18/2014 and Palbociclib 06/01/2014  (a) Palbociclib dose decreased to 100 mg daily, 21/7 as of 08/05/2014  (b) palbociclib dose decreased to 75 mg daily, 21/7, starting May 2017  (c) palbociclib dose decreased to 75 mg every other day beginning 07/26/2015    (d) polyp palbociclib held for month of October because of persistent low counts  (12) started zolendronate 05/18/2014, repeated  every 12 weeks  (a) July zolendronate dose held because of dental issues, resumed 10/18/2015  PLAN:  WeShaunessys tolerating her treatment well, with the exception of cytopenias due to the eye brands. This is in expected side effect of that medication.  She is already at the lowest dose level. I think the best thing to do at this point, since her ANWanamingooday is 1.0, is to hold the drug for month. We will see if her ANThompsonas risen to 1.5 by the time she returns in 4  weeks for her next fulvestrant dose. If it does we will resume the polyp palbociclib at that time at the 75 mg every other day dose level  We are resuming his alendronate today and she will repeat that every 3 months. Of course she continues the fulvestrant monthly as before.  I am delighted that there has been no evidence of recurrence to her brain. She will have her next brain MRI the first week in December, but she knows to call for any problems that may develop before that visit.  Her next zolendronate dose will be January 8 and she will see me on the same day. Chauncey Cruel, MD   10/18/2015 9:25 AM

## 2015-10-18 NOTE — Progress Notes (Signed)
Faslodex injection given in Infusion rOOM

## 2015-10-18 NOTE — Patient Instructions (Signed)
Zoledronic Acid injection (Hypercalcemia, Oncology)  What is this medicine?  ZOLEDRONIC ACID (ZOE le dron ik AS id) lowers the amount of calcium loss from bone. It is used to treat too much calcium in your blood from cancer. It is also used to prevent complications of cancer that has spread to the bone.  This medicine may be used for other purposes; ask your health care provider or pharmacist if you have questions.  What should I tell my health care provider before I take this medicine?  They need to know if you have any of these conditions:  -aspirin-sensitive asthma  -cancer, especially if you are receiving medicines used to treat cancer  -dental disease or wear dentures  -infection  -kidney disease  -receiving corticosteroids like dexamethasone or prednisone  -an unusual or allergic reaction to zoledronic acid, other medicines, foods, dyes, or preservatives  -pregnant or trying to get pregnant  -breast-feeding  How should I use this medicine?  This medicine is for infusion into a vein. It is given by a health care professional in a hospital or clinic setting.  Talk to your pediatrician regarding the use of this medicine in children. Special care may be needed.  Overdosage: If you think you have taken too much of this medicine contact a poison control center or emergency room at once.  NOTE: This medicine is only for you. Do not share this medicine with others.  What if I miss a dose?  It is important not to miss your dose. Call your doctor or health care professional if you are unable to keep an appointment.  What may interact with this medicine?  -certain antibiotics given by injection  -NSAIDs, medicines for pain and inflammation, like ibuprofen or naproxen  -some diuretics like bumetanide, furosemide  -teriparatide  -thalidomide  This list may not describe all possible interactions. Give your health care provider a list of all the medicines, herbs, non-prescription drugs, or dietary supplements you use. Also  tell them if you smoke, drink alcohol, or use illegal drugs. Some items may interact with your medicine.  What should I watch for while using this medicine?  Visit your doctor or health care professional for regular checkups. It may be some time before you see the benefit from this medicine. Do not stop taking your medicine unless your doctor tells you to. Your doctor may order blood tests or other tests to see how you are doing.  Women should inform their doctor if they wish to become pregnant or think they might be pregnant. There is a potential for serious side effects to an unborn child. Talk to your health care professional or pharmacist for more information.  You should make sure that you get enough calcium and vitamin D while you are taking this medicine. Discuss the foods you eat and the vitamins you take with your health care professional.  Some people who take this medicine have severe bone, joint, and/or muscle pain. This medicine may also increase your risk for jaw problems or a broken thigh bone. Tell your doctor right away if you have severe pain in your jaw, bones, joints, or muscles. Tell your doctor if you have any pain that does not go away or that gets worse.  Tell your dentist and dental surgeon that you are taking this medicine. You should not have major dental surgery while on this medicine. See your dentist to have a dental exam and fix any dental problems before starting this medicine. Take good care   your doctor or health care professional as soon as possible: -allergic reactions like skin rash, itching or hives, swelling of the face, lips, or tongue -anxiety, confusion, or depression -breathing problems -changes in vision -eye pain -feeling faint or lightheaded, falls -jaw pain,  especially after dental work -mouth sores -muscle cramps, stiffness, or weakness -redness, blistering, peeling or loosening of the skin, including inside the mouth -trouble passing urine or change in the amount of urine Side effects that usually do not require medical attention (report to your doctor or health care professional if they continue or are bothersome): -bone, joint, or muscle pain -constipation -diarrhea -fever -hair loss -irritation at site where injected -loss of appetite -nausea, vomiting -stomach upset -trouble sleeping -trouble swallowing -weak or tired This list may not describe all possible side effects. Call your doctor for medical advice about side effects. You may report side effects to FDA at 1-800-FDA-1088. Where should I keep my medicine? This drug is given in a hospital or clinic and will not be stored at home. NOTE: This sheet is a summary. It may not cover all possible information. If you have questions about this medicine, talk to your doctor, pharmacist, or health care provider.    2016, Elsevier/Gold Standard. (2013-05-24 14:19:39)    Fulvestrant injection What is this medicine? FULVESTRANT (ful VES trant) blocks the effects of estrogen. It is used to treat breast cancer. This medicine may be used for other purposes; ask your health care provider or pharmacist if you have questions. What should I tell my health care provider before I take this medicine? They need to know if you have any of these conditions: -bleeding problems -liver disease -low levels of platelets in the blood -an unusual or allergic reaction to fulvestrant, other medicines, foods, dyes, or preservatives -pregnant or trying to get pregnant -breast-feeding How should I use this medicine? This medicine is for injection into a muscle. It is usually given by a health care professional in a hospital or clinic setting. Talk to your pediatrician regarding the use of this medicine in  children. Special care may be needed. Overdosage: If you think you have taken too much of this medicine contact a poison control center or emergency room at once. NOTE: This medicine is only for you. Do not share this medicine with others. What if I miss a dose? It is important not to miss your dose. Call your doctor or health care professional if you are unable to keep an appointment. What may interact with this medicine? -medicines that treat or prevent blood clots like warfarin, enoxaparin, and dalteparin This list may not describe all possible interactions. Give your health care provider a list of all the medicines, herbs, non-prescription drugs, or dietary supplements you use. Also tell them if you smoke, drink alcohol, or use illegal drugs. Some items may interact with your medicine. What should I watch for while using this medicine? Your condition will be monitored carefully while you are receiving this medicine. You will need important blood work done while you are taking this medicine. Do not become pregnant while taking this medicine or for at least 1 year after stopping it. Women of child-bearing potential will need to have a negative pregnancy test before starting this medicine. Women should inform their doctor if they wish to become pregnant or think they might be pregnant. There is a potential for serious side effects to an unborn child. Men should inform their doctors if they wish to father a child. This medicine  may lower sperm counts. Talk to your health care professional or pharmacist for more information. Do not breast-feed an infant while taking this medicine or for 1 year after the last dose. What side effects may I notice from receiving this medicine? Side effects that you should report to your doctor or health care professional as soon as possible: -allergic reactions like skin rash, itching or hives, swelling of the face, lips, or tongue -feeling faint or lightheaded,  falls -pain, tingling, numbness, or weakness in the legs -signs and symptoms of infection like fever or chills; cough; flu-like symptoms; sore throat -vaginal bleeding Side effects that usually do not require medical attention (report to your doctor or health care professional if they continue or are bothersome): -aches, pains -constipation -diarrhea -headache -hot flashes -nausea, vomiting -pain at site where injected -stomach pain This list may not describe all possible side effects. Call your doctor for medical advice about side effects. You may report side effects to FDA at 1-800-FDA-1088. Where should I keep my medicine? This drug is given in a hospital or clinic and will not be stored at home. NOTE: This sheet is a summary. It may not cover all possible information. If you have questions about this medicine, talk to your doctor, pharmacist, or health care provider.    2016, Elsevier/Gold Standard. (2014-07-24 11:03:55)

## 2015-11-15 ENCOUNTER — Ambulatory Visit (HOSPITAL_BASED_OUTPATIENT_CLINIC_OR_DEPARTMENT_OTHER): Payer: BC Managed Care – PPO

## 2015-11-15 ENCOUNTER — Other Ambulatory Visit (HOSPITAL_BASED_OUTPATIENT_CLINIC_OR_DEPARTMENT_OTHER): Payer: BC Managed Care – PPO

## 2015-11-15 VITALS — BP 105/67 | HR 79 | Temp 98.8°F | Resp 18

## 2015-11-15 DIAGNOSIS — C7951 Secondary malignant neoplasm of bone: Secondary | ICD-10-CM

## 2015-11-15 DIAGNOSIS — C50919 Malignant neoplasm of unspecified site of unspecified female breast: Secondary | ICD-10-CM

## 2015-11-15 DIAGNOSIS — C773 Secondary and unspecified malignant neoplasm of axilla and upper limb lymph nodes: Secondary | ICD-10-CM | POA: Diagnosis not present

## 2015-11-15 DIAGNOSIS — C7931 Secondary malignant neoplasm of brain: Secondary | ICD-10-CM

## 2015-11-15 DIAGNOSIS — Z5111 Encounter for antineoplastic chemotherapy: Secondary | ICD-10-CM

## 2015-11-15 DIAGNOSIS — C50411 Malignant neoplasm of upper-outer quadrant of right female breast: Secondary | ICD-10-CM | POA: Diagnosis not present

## 2015-11-15 DIAGNOSIS — C50911 Malignant neoplasm of unspecified site of right female breast: Secondary | ICD-10-CM

## 2015-11-15 LAB — CBC WITH DIFFERENTIAL/PLATELET
BASO%: 0.3 % (ref 0.0–2.0)
BASOS ABS: 0 10*3/uL (ref 0.0–0.1)
EOS ABS: 0.1 10*3/uL (ref 0.0–0.5)
EOS%: 3.2 % (ref 0.0–7.0)
HEMATOCRIT: 37.5 % (ref 34.8–46.6)
HGB: 12.9 g/dL (ref 11.6–15.9)
LYMPH#: 1.2 10*3/uL (ref 0.9–3.3)
LYMPH%: 32.4 % (ref 14.0–49.7)
MCH: 31.5 pg (ref 25.1–34.0)
MCHC: 34.4 g/dL (ref 31.5–36.0)
MCV: 91.5 fL (ref 79.5–101.0)
MONO#: 0.4 10*3/uL (ref 0.1–0.9)
MONO%: 9.5 % (ref 0.0–14.0)
NEUT#: 2 10*3/uL (ref 1.5–6.5)
NEUT%: 54.6 % (ref 38.4–76.8)
Platelets: 144 10*3/uL — ABNORMAL LOW (ref 145–400)
RBC: 4.1 10*6/uL (ref 3.70–5.45)
RDW: 12.3 % (ref 11.2–14.5)
WBC: 3.7 10*3/uL — ABNORMAL LOW (ref 3.9–10.3)

## 2015-11-15 LAB — COMPREHENSIVE METABOLIC PANEL
ALK PHOS: 149 U/L (ref 40–150)
ALT: 18 U/L (ref 0–55)
ANION GAP: 9 meq/L (ref 3–11)
AST: 24 U/L (ref 5–34)
Albumin: 3.9 g/dL (ref 3.5–5.0)
BUN: 16.1 mg/dL (ref 7.0–26.0)
CALCIUM: 9.6 mg/dL (ref 8.4–10.4)
CHLORIDE: 105 meq/L (ref 98–109)
CO2: 28 mEq/L (ref 22–29)
Creatinine: 0.8 mg/dL (ref 0.6–1.1)
Glucose: 94 mg/dl (ref 70–140)
POTASSIUM: 3.9 meq/L (ref 3.5–5.1)
Sodium: 142 mEq/L (ref 136–145)
Total Bilirubin: 0.35 mg/dL (ref 0.20–1.20)
Total Protein: 7.5 g/dL (ref 6.4–8.3)

## 2015-11-15 MED ORDER — FULVESTRANT 250 MG/5ML IM SOLN
500.0000 mg | Freq: Once | INTRAMUSCULAR | Status: AC
  Administered 2015-11-15: 500 mg via INTRAMUSCULAR
  Filled 2015-11-15: qty 10

## 2015-11-15 NOTE — Patient Instructions (Signed)

## 2015-11-26 ENCOUNTER — Other Ambulatory Visit: Payer: Self-pay | Admitting: Radiation Therapy

## 2015-11-26 DIAGNOSIS — C7931 Secondary malignant neoplasm of brain: Secondary | ICD-10-CM

## 2015-11-26 DIAGNOSIS — C7949 Secondary malignant neoplasm of other parts of nervous system: Principal | ICD-10-CM

## 2015-12-03 ENCOUNTER — Other Ambulatory Visit: Payer: Self-pay | Admitting: *Deleted

## 2015-12-09 ENCOUNTER — Ambulatory Visit
Admission: RE | Admit: 2015-12-09 | Discharge: 2015-12-09 | Disposition: A | Discharge status: Home / Self Care | Payer: BC Managed Care – PPO | Source: Ambulatory Visit | Attending: Radiation Oncology | Admitting: Radiation Oncology

## 2015-12-09 DIAGNOSIS — C7931 Secondary malignant neoplasm of brain: Secondary | ICD-10-CM

## 2015-12-09 DIAGNOSIS — C7949 Secondary malignant neoplasm of other parts of nervous system: Principal | ICD-10-CM

## 2015-12-09 MED ORDER — GADOBENATE DIMEGLUMINE 529 MG/ML IV SOLN
11.0000 mL | Freq: Once | INTRAVENOUS | Status: AC | PRN
  Administered 2015-12-09: 11 mL via INTRAVENOUS

## 2015-12-10 ENCOUNTER — Telehealth: Payer: Self-pay | Admitting: *Deleted

## 2015-12-10 NOTE — Telephone Encounter (Signed)
CALLED PATIENT TO INFORM OF FU APPT. BEING MOVED FROM 12-13-15 @ 11 AM WITH DR. MOODY TO 12-13-15 @ 9 AM WITH ALISON PERKINS, SPOKE WITH PATIENT AND SHE IS AWARE OF THIS APPT. CHANGE AND SHE IS GOOD WITH THIS APPT. CHANGE

## 2015-12-10 NOTE — Progress Notes (Signed)
Miss Carolyn [name]. Gill here for s/p SRS of brain and review of MRI from 12-09-15.  History of metastatic ER PR positive, HER-2 right invasive carcinoma of the breast with metastatic disease to the brain.  Headache:None Dizziness:None Pain:None Nausea: None Fatigue:Having fatigue id she over does her activity. Appetite:Good No memory problems or motor skill issues.  Using a cane to a assist with ambulation for balance. Wt Readings from Last 3 Encounters:  12/13/15 127 lb 3.2 oz (57.7 kg)  10/18/15 126 lb 14.4 oz (57.6 kg)  09/08/15 123 lb 12.8 oz (56.2 kg)  BP 115/74   Pulse 95   Temp 98.4 F (36.9 C) (Oral)   Resp 18   Ht 5' 8" (1.727 m)   Wt 127 lb 3.2 oz (57.7 kg)   LMP 07/02/2011   SpO2 100%   BMI 19.34 kg/m

## 2015-12-13 ENCOUNTER — Ambulatory Visit
Admission: RE | Admit: 2015-12-13 | Discharge: 2015-12-13 | Disposition: A | Discharge status: Home / Self Care | Payer: BC Managed Care – PPO | Source: Ambulatory Visit | Attending: Radiation Oncology | Admitting: Radiation Oncology

## 2015-12-13 ENCOUNTER — Ambulatory Visit (HOSPITAL_BASED_OUTPATIENT_CLINIC_OR_DEPARTMENT_OTHER): Payer: BC Managed Care – PPO

## 2015-12-13 ENCOUNTER — Other Ambulatory Visit (HOSPITAL_BASED_OUTPATIENT_CLINIC_OR_DEPARTMENT_OTHER): Payer: BC Managed Care – PPO

## 2015-12-13 ENCOUNTER — Encounter: Payer: Self-pay | Admitting: Radiation Oncology

## 2015-12-13 VITALS — BP 115/74 | HR 95 | Temp 98.4°F | Resp 18 | Ht 68.0 in | Wt 127.2 lb

## 2015-12-13 VITALS — BP 115/74 | HR 84 | Temp 98.4°F | Resp 20

## 2015-12-13 DIAGNOSIS — C773 Secondary and unspecified malignant neoplasm of axilla and upper limb lymph nodes: Secondary | ICD-10-CM | POA: Diagnosis not present

## 2015-12-13 DIAGNOSIS — Z90710 Acquired absence of both cervix and uterus: Secondary | ICD-10-CM | POA: Diagnosis not present

## 2015-12-13 DIAGNOSIS — C50919 Malignant neoplasm of unspecified site of unspecified female breast: Secondary | ICD-10-CM

## 2015-12-13 DIAGNOSIS — Z888 Allergy status to other drugs, medicaments and biological substances status: Secondary | ICD-10-CM | POA: Diagnosis not present

## 2015-12-13 DIAGNOSIS — Z803 Family history of malignant neoplasm of breast: Secondary | ICD-10-CM | POA: Diagnosis not present

## 2015-12-13 DIAGNOSIS — C50911 Malignant neoplasm of unspecified site of right female breast: Secondary | ICD-10-CM

## 2015-12-13 DIAGNOSIS — C50411 Malignant neoplasm of upper-outer quadrant of right female breast: Secondary | ICD-10-CM

## 2015-12-13 DIAGNOSIS — Z9049 Acquired absence of other specified parts of digestive tract: Secondary | ICD-10-CM | POA: Insufficient documentation

## 2015-12-13 DIAGNOSIS — Z8041 Family history of malignant neoplasm of ovary: Secondary | ICD-10-CM | POA: Insufficient documentation

## 2015-12-13 DIAGNOSIS — Z800 Family history of malignant neoplasm of digestive organs: Secondary | ICD-10-CM | POA: Insufficient documentation

## 2015-12-13 DIAGNOSIS — Z9011 Acquired absence of right breast and nipple: Secondary | ICD-10-CM | POA: Insufficient documentation

## 2015-12-13 DIAGNOSIS — C7951 Secondary malignant neoplasm of bone: Secondary | ICD-10-CM | POA: Diagnosis not present

## 2015-12-13 DIAGNOSIS — Z9221 Personal history of antineoplastic chemotherapy: Secondary | ICD-10-CM | POA: Insufficient documentation

## 2015-12-13 DIAGNOSIS — C7931 Secondary malignant neoplasm of brain: Secondary | ICD-10-CM | POA: Diagnosis present

## 2015-12-13 DIAGNOSIS — Z91013 Allergy to seafood: Secondary | ICD-10-CM | POA: Insufficient documentation

## 2015-12-13 DIAGNOSIS — Z923 Personal history of irradiation: Secondary | ICD-10-CM | POA: Diagnosis not present

## 2015-12-13 DIAGNOSIS — Z79899 Other long term (current) drug therapy: Secondary | ICD-10-CM | POA: Diagnosis not present

## 2015-12-13 DIAGNOSIS — Z806 Family history of leukemia: Secondary | ICD-10-CM | POA: Insufficient documentation

## 2015-12-13 DIAGNOSIS — Z5111 Encounter for antineoplastic chemotherapy: Secondary | ICD-10-CM

## 2015-12-13 LAB — COMPREHENSIVE METABOLIC PANEL
ALBUMIN: 4 g/dL (ref 3.5–5.0)
ALK PHOS: 149 U/L (ref 40–150)
ALT: 13 U/L (ref 0–55)
AST: 21 U/L (ref 5–34)
Anion Gap: 10 mEq/L (ref 3–11)
BUN: 15.8 mg/dL (ref 7.0–26.0)
CALCIUM: 10.2 mg/dL (ref 8.4–10.4)
CO2: 30 mEq/L — ABNORMAL HIGH (ref 22–29)
CREATININE: 0.8 mg/dL (ref 0.6–1.1)
Chloride: 105 mEq/L (ref 98–109)
EGFR: >90 mL/min/{1.73_m2} (ref >90)
Glucose: 97 mg/dl (ref 70–140)
Potassium: 4 mEq/L (ref 3.5–5.1)
Sodium: 146 mEq/L — ABNORMAL HIGH (ref 136–145)
Total Bilirubin: 0.27 mg/dL (ref 0.20–1.20)
Total Protein: 7.8 g/dL (ref 6.4–8.3)

## 2015-12-13 LAB — CBC WITH DIFFERENTIAL/PLATELET
BASO%: 0.4 % (ref 0.0–2.0)
BASOS ABS: 0 10*3/uL (ref 0.0–0.1)
EOS%: 1 % (ref 0.0–7.0)
Eosinophils Absolute: 0 10*3/uL (ref 0.0–0.5)
HEMATOCRIT: 39.1 % (ref 34.8–46.6)
HEMOGLOBIN: 13 g/dL (ref 11.6–15.9)
LYMPH#: 1 10*3/uL (ref 0.9–3.3)
LYMPH%: 33.8 % (ref 14.0–49.7)
MCH: 31.1 pg (ref 25.1–34.0)
MCHC: 33.4 g/dL (ref 31.5–36.0)
MCV: 93.3 fL (ref 79.5–101.0)
MONO#: 0.3 10*3/uL (ref 0.1–0.9)
MONO%: 9.4 % (ref 0.0–14.0)
NEUT#: 1.6 10*3/uL (ref 1.5–6.5)
NEUT%: 55.4 % (ref 38.4–76.8)
Platelets: 161 10*3/uL (ref 145–400)
RBC: 4.18 10*6/uL (ref 3.70–5.45)
RDW: 13.7 % (ref 11.2–14.5)
WBC: 3 10*3/uL — ABNORMAL LOW (ref 3.9–10.3)

## 2015-12-13 MED ORDER — FULVESTRANT 250 MG/5ML IM SOLN
500.0000 mg | Freq: Once | INTRAMUSCULAR | Status: AC
  Administered 2015-12-13: 500 mg via INTRAMUSCULAR
  Filled 2015-12-13: qty 10

## 2015-12-13 MED FILL — IBRANCE 75 MG CAPSULE: 75 | 28 days supply | Qty: 11 | Fill #2

## 2015-12-13 NOTE — Progress Notes (Signed)
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 This is a young patient who was diagnosed with metastatic right breast cancer to the brain who underwent stereotactic radiosurgery to the brain lesion in 2016. She continued to have edema and subsequently was referred to Dr. Salomon Fick at [facility]. She underwent LITT on 11/20/14. She has been followed closely since. She saw Dr. Salomon Fick 09/24/15 at [facility], and requests that he be copied on her most recent MRI on 12/09/15. This study didn't reveal any new metastatic deposit in the right parietal region measuring 4 mm. No other evidence of the disease is present, and her previously treated lesion in the left parietal region is stable. Recommendations from multidisciplinary brain conference were to proceed with SRS.   Past Medical History:  Past Medical History:  Diagnosis Date  . Breast cancer ([place]) 09/2011   invasive ductal carcinoma metastatic ca in 3/14 lymph nodes  . History of chemotherapy 06/27/11 -09/04/11   neoadjuvant  . Hx of radiation therapy 11/08/11 -12/26/11   right chest wall/supraclav fossa, right scar  . S/P radiation therapy 05/08/14   SRS brain    Past Surgical History: Past Surgical History:  Procedure Laterality Date  . brain surgery-LITT Left 11/20/14   LITT procedure for Lt brain met.  Marland Kitchen BREAST BIOPSY     Left  . CHOLECYSTECTOMY N/A 03/01/2014   Procedure: LAPAROSCOPIC CHOLECYSTECTOMY;  Surgeon: Leighton Ruff, MD;   Location: [facility];  Service: General;  Laterality: N/A;  . LAMINECTOMY N/A 04/13/2014   Procedure: THORACIC LAMINECTOMY WITH FIXATION THORACIC SIX-THORACIC TEN FUSION;  Surgeon: Kristeen Miss, MD;  Location: [place] [facility];  Service: Neurosurgery;  Laterality: N/A;  . MODIFIED MASTECTOMY  10/03/2011   Procedure: MODIFIED MASTECTOMY;  Surgeon: Haywood Lasso, MD;  Location: [place];  Service: General;  Laterality: Right;  . PORT-A-CATH REMOVAL  01/30/2012   Procedure: MINOR REMOVAL PORT-A-CATH;  Surgeon: Haywood Lasso, MD;  Location: [place];  Service: General;  Laterality: Left;  . PORTACATH PLACEMENT  06/20/2011   Procedure: INSERTION PORT-A-CATH;  Surgeon: Haywood Lasso, MD;  Location: [place];  Service: General;  Laterality: N/A;  . ROBOTIC ASSISTED TOTAL HYSTERECTOMY WITH BILATERAL SALPINGO OOPHERECTOMY Bilateral 12/23/2012   Procedure: ROBOTIC ASSISTED TOTAL HYSTERECTOMY WITH BILATERAL SALPINGO OOPHORECTOMY;  Surgeon: Marvene Staff, MD;  Location: [place] [facility];  Service: Gynecology;  Laterality: Bilateral;  . WISDOM TOOTH EXTRACTION      Social History:  Social History   Social History  . Marital status: Single    Spouse name: N/A  . Number of children: N/A  . Years of education: N/A   Occupational History  . Not on file.   Social History Main Topics  . Smoking status: Never Smoker  . Smokeless tobacco: Never Used  . Alcohol use No  . Drug use: No  . Sexual activity: Not Currently    Birth control/ protection: Condom   Other Topics Concern  . Not  on file   Social History Narrative  . No narrative on file    Family History: Family History  Problem Relation Age of Onset  . Breast cancer Maternal Aunt 37  . Pancreatic cancer Maternal Grandfather   . Pancreatic cancer Maternal Aunt     died in her 4s  . Leukemia Maternal Aunt     died in her 67s  . Ovarian cancer Cousin 77    maternal cousin                                ALLERGIES:  is allergic to allegra [fexofenadine] and shellfish allergy.  Meds: Current Outpatient Prescriptions  Medication Sig Dispense Refill  . Calcium Carbonate-Vit D-Min (CALCIUM 1200 PO) Take 1 tablet by mouth 2 (two) times daily.    . Cholecalciferol (VITAMIN D3) 2000 UNITS TABS Take 2,000 Units by mouth daily.    . Multiple Vitamin (MULTIVITAMIN) tablet Take 1 tablet by mouth daily.    . palbociclib (IBRANCE) 75 MG capsule TAKE 1 CAPSULE BY MOUTH every other day for 21 days (total 11 capsules) then off a week 11 capsule 6  . acetaminophen (TYLENOL) 325 MG tablet Take 2 tablets (650 mg total) by mouth every 6 (six) hours as needed for mild pain, moderate pain, fever or headache. (Patient not taking: Reported on 12/13/2015)     No current facility-administered medications for this encounter.     Physical Findings:  height is _0  (1.727 m) and weight is 127 lb 3.2 oz (57.7 kg). Her oral temperature is 98.4 F (36.9 C). Her blood pressure is 115/74 and her pulse is 95. Her respiration is 18 and oxygen saturation is 100%.   In general this is a well appearing African-American female in no acute distress. She's alert and oriented x4 and appropriate throughout the examination. Cardiopulmonary assessment is negative for acute distress and she exhibits normal effort.     Lab Findings: Lab Results  Component Value Date   WBC 3.7 (L) 11/15/2015   HGB 12.9 11/15/2015   HCT 37.5 11/15/2015   MCV 91.5 11/15/2015   PLT 144 (L) 11/15/2015     Radiographic Findings: Mr Jeri Cos XB Contrast  Result Date: 12/09/2015 CLINICAL DATA:  Metastatic breast cancer. Surgery and radiation. Seventeen month stereotactic radiosurgery restaging EXAM: MRI HEAD WITHOUT AND WITH CONTRAST TECHNIQUE: Multiplanar, multiecho pulse sequences of the brain and surrounding structures were obtained without and with intravenous contrast. CONTRAST:  10m MULTIHANCE GADOBENATE DIMEGLUMINE 529 MG/ML IV SOLN  COMPARISON:  MRI 09/06/2015 FINDINGS: Brain: Left parietal periventricular metastatic deposit is stable. There is evidence of prior hemorrhage as well as a biopsy tract. The lesion shows slight peripheral enhancement measures 16 x 14 mm. No significant surrounding edema. New enhancing nodule in the right parietal lobe measuring 4 mm compatible with metastatic disease. No significant edema. No other metastatic deposits in the brain. 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 This measures 12 x 17 mm. Ventricle size normal. No shift of the midline structures. Negative for acute infarct. Vascular: Normal arterial flow voids. Skull and upper cervical spine: Left temporal bone lesion is stable. No new skull lesions identified. Sinuses/Orbits: negative Other: None IMPRESSION: Treated metastatic deposit in the left parietal periventricular white matter is stable New 4 mm lesion in the right parietal lobe  compatible with metastatic disease Left temporal bone lesion is stable. Electronically Signed   By: Franchot Gallo M.D.   On: 12/09/2015 14:09    Impression/Plan: 1. 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 The patient has recently been on systemic therapy with Dr. [name] with [facility]. She has been holding this due to low blood counts, and is planning to resume seen. We discussed the findings from her MRI scan in the recommendations from this mornings multidisciplinary brain conference which included moving forward with radiosurgery to a new 4 mm right parietal lesion. We discussed the risks, benefits short and long-term effects of treatment, the patient is interested in moving forward. We will coordinate CT simulation and plan treatment to be completed before the end of next week. She states agreement and  understanding. We will begin surveillance thereafter. She states agreement and understanding. Written consent will be obtained when she comes for simulation.

## 2015-12-13 NOTE — Patient Instructions (Signed)

## 2015-12-13 NOTE — Addendum Note (Signed)
Encounter addended by: Malena Edman, RN on: 12/13/2015 10:11 AM<BR>    Actions taken: Charge Capture section accepted

## 2015-12-14 ENCOUNTER — Other Ambulatory Visit: Payer: Self-pay | Admitting: Emergency Medicine

## 2015-12-14 ENCOUNTER — Telehealth: Payer: Self-pay | Admitting: Emergency Medicine

## 2015-12-14 DIAGNOSIS — Z170 Estrogen receptor positive status [ER+]: Principal | ICD-10-CM

## 2015-12-14 DIAGNOSIS — C50411 Malignant neoplasm of upper-outer quadrant of right female breast: Secondary | ICD-10-CM

## 2015-12-14 NOTE — Telephone Encounter (Signed)
Spoke with patient; Instructed patient to start the Ibrance 75mg  PO every other day; patient will start on 11/15/15 when medication is delivered.   CT chest and whole body scan ordered per Dr [name] to be scheduled prior to 01/17/16 office visit. Patient aware of this as well.

## 2015-12-15 ENCOUNTER — Ambulatory Visit
Admission: RE | Admit: 2015-12-15 | Discharge: 2015-12-15 | Disposition: A | Discharge status: Home / Self Care | Payer: BC Managed Care – PPO | Source: Ambulatory Visit | Attending: Radiation Oncology | Admitting: Radiation Oncology

## 2015-12-15 VITALS — BP 134/94 | HR 86 | Temp 99.4°F | Resp 20 | Ht 68.0 in | Wt 127.0 lb

## 2015-12-15 DIAGNOSIS — C7931 Secondary malignant neoplasm of brain: Secondary | ICD-10-CM | POA: Insufficient documentation

## 2015-12-15 DIAGNOSIS — C7951 Secondary malignant neoplasm of bone: Secondary | ICD-10-CM

## 2015-12-15 DIAGNOSIS — Z510 Encounter for antineoplastic radiation therapy: Secondary | ICD-10-CM | POA: Insufficient documentation

## 2015-12-15 MED ORDER — SODIUM CHLORIDE 0.9% FLUSH
10.0000 mL | Freq: Once | INTRAVENOUS | Status: AC
  Administered 2015-12-15: 10 mL via INTRAVENOUS

## 2015-12-15 NOTE — Addendum Note (Signed)
Encounter addended by: Doreen Beam, RN on: 12/15/2015  2:32 PM<BR>    Actions taken: Flowsheet accepted, Sign clinical note

## 2015-12-15 NOTE — Progress Notes (Addendum)
Does patient have an allergy to IV contrast dye?: no  Has patient ever received premedication for IV contrast dye?: no   Does patient take metformin?: No.  If patient does take metformin when was the last dose: not diabetic, yellow  fall risk band on left arm Date of lab work:  BUN: 15.8 CR: 0.8  IV site: Left AC  22g  1 inch catheter needle x 1 attempt, excellent blood return, flushed with 65ml normal saline, opsite applied over site, patient tolerated well, gave mask patient has low grade fever 99.4 BP (!) 134/94 (BP Location: Left Arm, Patient Position: Sitting, Cuff Size: Normal)   Pulse 86   Temp 99.4 F (37.4 C) (Oral)   Resp 20   Ht 5\' 8"  (1.727 m)   Wt 127 lb (57.6 kg)   LMP 07/02/2011   SpO2 100% Comment: room air  BMI 19.31 kg/m  Lab 12/13/15 LMP 07/02/2011   Mont Dutton, brain navigator removed  patient IV catheter LAC#, tip intact, placed gause and tape over site, patient d/c home ambulatory steady gait with cane

## 2015-12-17 ENCOUNTER — Telehealth: Payer: Self-pay | Admitting: Radiation Oncology

## 2015-12-17 NOTE — Telephone Encounter (Signed)
Spoke with the patient this morning, she has met with Dr. Salomon Fick, and has an appointment to meet with the radiation oncologist at [facility]. I asked the patient and she wanted [place] to hold off on her SRS treatments that she will be meeting folks at [facility], however she is interested in maintaining her planned appointment for next Thursday seed with SRS, but would like to continue follow-up with Dr. Salomon Fick along with [place].

## 2015-12-21 DIAGNOSIS — Z510 Encounter for antineoplastic radiation therapy: Secondary | ICD-10-CM | POA: Diagnosis not present

## 2015-12-23 ENCOUNTER — Encounter: Payer: Self-pay | Admitting: Radiation Oncology

## 2015-12-23 ENCOUNTER — Ambulatory Visit
Admission: RE | Admit: 2015-12-23 | Discharge: 2015-12-23 | Disposition: A | Discharge status: Home / Self Care | Payer: BC Managed Care – PPO | Source: Ambulatory Visit | Attending: Radiation Oncology | Admitting: Radiation Oncology

## 2015-12-23 ENCOUNTER — Telehealth: Payer: Self-pay | Admitting: Pharmacist

## 2015-12-23 VITALS — BP 99/55 | HR 73 | Temp 98.3°F | Resp 16 | Wt 125.6 lb

## 2015-12-23 DIAGNOSIS — C7931 Secondary malignant neoplasm of brain: Secondary | ICD-10-CM

## 2015-12-23 DIAGNOSIS — Z510 Encounter for antineoplastic radiation therapy: Secondary | ICD-10-CM | POA: Diagnosis not present

## 2015-12-23 NOTE — Progress Notes (Signed)
Patient arrived ambulatory with Mont Dutton to dressing room 1, s/p SRS Brain x1, patient voiced no c/o pain, head ache, nausea, no vision changes, no tinnitus, offered water, turned T.V. On , call bell within reach, monitor 30 minutes, patient knows to call for any concerns, fever,nausea , or any other unusual symptoms occurring  After discharge, patient stated she is going to work for Christmas breakfast 8:47 AM

## 2015-12-23 NOTE — Telephone Encounter (Signed)
Received fax request for PA for Ibrance. Completed clinical questions. MD signed and dated request. Faxed back to [place] at [phone] Fax confirmation received.  Will continue to follow.  Raul Del, PharmD, BCPS, BCOP [facility]. [phone]

## 2015-12-24 ENCOUNTER — Telehealth: Payer: Self-pay | Admitting: Pharmacist

## 2015-12-24 NOTE — Telephone Encounter (Signed)
error 

## 2015-12-24 NOTE — Telephone Encounter (Signed)
Received fax notification from [facility] - [facility] is approved from 12/23/15 - 12/22/16  Notified [facility] outpatient pharmacy.  Will continue to follow.  Raul Del, PharmD, BCPS, BCOP [facility] [phone]

## 2015-12-29 NOTE — Procedures (Signed)
  Name: Carolyn [name]  MRN: [id number]  Date: 12/23/2015   DOB: 1970/01/12  Stereotactic Radiosurgery Operative Note  PRE-OPERATIVE DIAGNOSIS:  Multiple Brain Metastases  POST-OPERATIVE DIAGNOSIS:  Multiple Brain Metastases  PROCEDURE:  Stereotactic Radiosurgery  SURGEON:  Earleen Newport, MD  NARRATIVE: The patient underwent a radiation treatment planning session in the radiation oncology simulation suite under the care of the radiation oncology physician and physicist.  I participated closely in the radiation treatment planning afterwards. The patient underwent planning CT which was fused to 3T high resolution MRI with 1 mm axial slices.  These images were fused on the planning system.  We contoured the gross target volumes and subsequently expanded this to yield the Planning Target Volume. I actively participated in the planning process.  I helped to define and review the target contours and also the contours of the optic pathway, eyes, brainstem and selected nearby organs at risk.  All the dose constraints for critical structures were reviewed and compared to AAPM Task Group 101.  The prescription dose conformity was reviewed.  I approved the plan electronically.    Accordingly, [name] was brought to the TrueBeam stereotactic radiation treatment linac and placed in the custom immobilization mask.  The patient was aligned according to the IR fiducial markers with BrainLab Exactrac, then orthogonal x-rays were used in ExacTrac with the 6DOF robotic table and the shifts were made to align the patient  [name] received stereotactic radiosurgery uneventfully.    Lesions treated:  3   Complex lesions treated:  0 (>3.5 cm, <20mm of optic path, or within the brainstem)   The detailed description of the procedure is recorded in the radiation oncology procedure note.  I was present for the duration of the procedure.  DISPOSITION:  Following delivery, the patient was transported to nursing  in stable condition and monitored for possible acute effects to be discharged to home in stable condition with follow-up in one month.  Earleen Newport, MD 12/29/2015 5:27 PM

## 2016-01-07 ENCOUNTER — Telehealth: Payer: Self-pay | Admitting: Medical Oncology

## 2016-01-07 ENCOUNTER — Telehealth: Payer: Self-pay | Admitting: *Deleted

## 2016-01-07 MED FILL — IBRANCE 75 MG CAPSULE: 75 | 28 days supply | Qty: 11 | Fill #3

## 2016-01-07 NOTE — Telephone Encounter (Signed)
Returned pt call after coming back from lunch, message from med onc patient asking about appts?,waiting on call back from the patient 12:09 PM

## 2016-01-07 NOTE — Telephone Encounter (Signed)
Pt returned call to Dr Ida Rogue nurse about ? Appts.  Pt got disconnected when I transferred call. Note to Dr Ida Rogue nurse.

## 2016-01-14 ENCOUNTER — Encounter (HOSPITAL_COMMUNITY)
Admission: RE | Admit: 2016-01-14 | Discharge: 2016-01-14 | Disposition: A | Discharge status: Home / Self Care | Payer: BC Managed Care – PPO | Source: Ambulatory Visit | Attending: Oncology | Admitting: Oncology

## 2016-01-14 ENCOUNTER — Other Ambulatory Visit: Payer: Self-pay | Admitting: *Deleted

## 2016-01-14 ENCOUNTER — Ambulatory Visit (HOSPITAL_COMMUNITY)
Admission: RE | Admit: 2016-01-14 | Discharge: 2016-01-14 | Disposition: A | Discharge status: Home / Self Care | Payer: BC Managed Care – PPO | Source: Ambulatory Visit | Attending: Oncology | Admitting: Oncology

## 2016-01-14 DIAGNOSIS — C50911 Malignant neoplasm of unspecified site of right female breast: Secondary | ICD-10-CM

## 2016-01-14 DIAGNOSIS — C50411 Malignant neoplasm of upper-outer quadrant of right female breast: Secondary | ICD-10-CM

## 2016-01-14 DIAGNOSIS — R918 Other nonspecific abnormal finding of lung field: Secondary | ICD-10-CM | POA: Diagnosis not present

## 2016-01-14 DIAGNOSIS — C7931 Secondary malignant neoplasm of brain: Secondary | ICD-10-CM

## 2016-01-14 DIAGNOSIS — Z170 Estrogen receptor positive status [ER+]: Secondary | ICD-10-CM | POA: Insufficient documentation

## 2016-01-14 DIAGNOSIS — C7951 Secondary malignant neoplasm of bone: Secondary | ICD-10-CM | POA: Insufficient documentation

## 2016-01-14 MED ORDER — TECHNETIUM TC 99M MEDRONATE IV KIT: 25.0000 | PACK | Freq: Once | INTRAVENOUS | Status: DC | PRN

## 2016-01-14 MED ORDER — IOPAMIDOL (ISOVUE-300) INJECTION 61%
75.0000 mL | Freq: Once | INTRAVENOUS | Status: AC | PRN
  Administered 2016-01-14: 75 mL via INTRAVENOUS

## 2016-01-17 ENCOUNTER — Ambulatory Visit (HOSPITAL_BASED_OUTPATIENT_CLINIC_OR_DEPARTMENT_OTHER): Payer: BC Managed Care – PPO | Admitting: Oncology

## 2016-01-17 ENCOUNTER — Ambulatory Visit: Payer: BC Managed Care – PPO

## 2016-01-17 ENCOUNTER — Other Ambulatory Visit (HOSPITAL_BASED_OUTPATIENT_CLINIC_OR_DEPARTMENT_OTHER): Payer: BC Managed Care – PPO

## 2016-01-17 ENCOUNTER — Ambulatory Visit (HOSPITAL_BASED_OUTPATIENT_CLINIC_OR_DEPARTMENT_OTHER): Payer: BC Managed Care – PPO

## 2016-01-17 VITALS — BP 96/71 | HR 79 | Temp 99.0°F | Resp 20

## 2016-01-17 VITALS — BP 129/72 | HR 91 | Temp 97.8°F | Resp 18 | Ht 68.0 in | Wt 127.5 lb

## 2016-01-17 DIAGNOSIS — C773 Secondary and unspecified malignant neoplasm of axilla and upper limb lymph nodes: Secondary | ICD-10-CM

## 2016-01-17 DIAGNOSIS — E440 Moderate protein-calorie malnutrition: Secondary | ICD-10-CM

## 2016-01-17 DIAGNOSIS — C50911 Malignant neoplasm of unspecified site of right female breast: Secondary | ICD-10-CM

## 2016-01-17 DIAGNOSIS — C7951 Secondary malignant neoplasm of bone: Secondary | ICD-10-CM

## 2016-01-17 DIAGNOSIS — C7931 Secondary malignant neoplasm of brain: Secondary | ICD-10-CM

## 2016-01-17 DIAGNOSIS — C50411 Malignant neoplasm of upper-outer quadrant of right female breast: Secondary | ICD-10-CM

## 2016-01-17 DIAGNOSIS — Z170 Estrogen receptor positive status [ER+]: Principal | ICD-10-CM

## 2016-01-17 DIAGNOSIS — G959 Disease of spinal cord, unspecified: Secondary | ICD-10-CM

## 2016-01-17 DIAGNOSIS — C50919 Malignant neoplasm of unspecified site of unspecified female breast: Secondary | ICD-10-CM

## 2016-01-17 DIAGNOSIS — Z5111 Encounter for antineoplastic chemotherapy: Secondary | ICD-10-CM

## 2016-01-17 LAB — CBC WITH DIFFERENTIAL/PLATELET
BASO%: 0.5 % (ref 0.0–2.0)
Basophils Absolute: 0 10*3/uL (ref 0.0–0.1)
EOS ABS: 0 10*3/uL (ref 0.0–0.5)
EOS%: 1.2 % (ref 0.0–7.0)
HCT: 39.4 % (ref 34.8–46.6)
HGB: 13.3 g/dL (ref 11.6–15.9)
LYMPH%: 27.9 % (ref 14.0–49.7)
MCH: 31.4 pg (ref 25.1–34.0)
MCHC: 33.8 g/dL (ref 31.5–36.0)
MCV: 92.9 fL (ref 79.5–101.0)
MONO#: 0.3 10*3/uL (ref 0.1–0.9)
MONO%: 9.1 % (ref 0.0–14.0)
NEUT%: 61.3 % (ref 38.4–76.8)
NEUTROS ABS: 2.2 10*3/uL (ref 1.5–6.5)
Platelets: 156 10*3/uL (ref 145–400)
RBC: 4.24 10*6/uL (ref 3.70–5.45)
RDW: 13.4 % (ref 11.2–14.5)
WBC: 3.6 10*3/uL — AB (ref 3.9–10.3)
lymph#: 1 10*3/uL (ref 0.9–3.3)

## 2016-01-17 LAB — COMPREHENSIVE METABOLIC PANEL
ALT: 28 U/L (ref 0–55)
AST: 29 U/L (ref 5–34)
Albumin: 4.3 g/dL (ref 3.5–5.0)
Alkaline Phosphatase: 175 U/L — ABNORMAL HIGH (ref 40–150)
Anion Gap: 10 mEq/L (ref 3–11)
BUN: 15.7 mg/dL (ref 7.0–26.0)
CO2: 28 meq/L (ref 22–29)
Calcium: 10 mg/dL (ref 8.4–10.4)
Chloride: 105 mEq/L (ref 98–109)
Creatinine: 0.9 mg/dL (ref 0.6–1.1)
GLUCOSE: 114 mg/dL (ref 70–140)
POTASSIUM: 3.5 meq/L (ref 3.5–5.1)
Sodium: 143 mEq/L (ref 136–145)
TOTAL PROTEIN: 7.8 g/dL (ref 6.4–8.3)
Total Bilirubin: 0.27 mg/dL (ref 0.20–1.20)

## 2016-01-17 MED ORDER — FULVESTRANT 250 MG/5ML IM SOLN
500.0000 mg | Freq: Once | INTRAMUSCULAR | Status: AC
  Administered 2016-01-17: 500 mg via INTRAMUSCULAR
  Filled 2016-01-17: qty 10

## 2016-01-17 NOTE — Progress Notes (Signed)
[facility]  Telephone:([phone]) [phone] Fax:(336) [phone]     ID: Carolyn [name] DOB: Apr 16, 1970  MR#: [id number]  KYH#:[id number]  Patient Care Team: Carolyn Arnt, MD as PCP - General (Family Medicine) Carolyn Cruel, MD as Consulting Physician (Oncology) Carolyn Rudd, MD as Consulting Physician (Radiation Oncology) Carolyn [name], DMD as Physician Assistant (Dentistry) Carolyn Blanks. 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 She tolerated that remarkably well.  We just restage her with a chest CT scan and bone scan. Those results were discussed extensively as noted below.  REVIEW OF SYSTEMS: Carolyn [name]  was able to fly to [place] to be with her family for the holidays, which she greatly enjoyed. She continues to work full time. She tries to stay as active as she can, using her cane to help her walk. She complains of fatigue which she describes as mild to moderate. She is developing pain in her left hip area, more in the upper outer left buttock area. This is very intermittent. It is relieved by walking although sometimes walking can cause it. It is fine when she is lying down. It is worse when she sits for long period of time. At present she denies unusual headaches, visual changes, nausea, vomiting, or any change in bowel or bladder habits. A detailed review of systems today was otherwise stable.  BREAST CANCER HISTORY: From Carolyn [name]'s of original  intake node 05/31/2011:  "Carolyn [name] is a 46 y.o. female. Without significant past medical history. She underwent a mammogram that showed calcifications measuring 5 cm on the right breast. She then went on To have an ultrasound which showed the area to measure 2.9 cm. She was also found to have a right axillary lymph node that was suspicious for a malignancy. She had a biopsy of the calcifications that showed high-grade ductal carcinoma in situ. 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 In the lymph node biopsy there was no lymphatic tissue seen and was felt that the node was replaced by tumor. Patient went on to have an MRI of the bilateral breasts performed on evening of 05/30/2011. The MRI showed in the right breast 5 x 3 x 4.5 cm mass abutting the chest wall. About 7 cm away from this there was another mass in the upper inner quadrant that measured 1.5 x 1.7 x 1.5 cm. On the contralateral breast, that is the left breast a 2 cm area suspicious enhancement was noted also this up to date has not been biopsied and arrangements are being made for the biopsy to be performed. In this side there were no suspicious lymph nodes. The prognostic panel is pending. Patient is otherwise without any complaints. "  METASTATIC DISEASE: From the earlier summary note:  Carolyn [name] noted some strange feelings around her umbilicus 15/17/6160. This felt like an area of numbness. Over the next 2 days she noted some leg weakness and difficulty walking, so she presented to the ED 04/12/2014. MRI of the thoraco-lumbar spine was obtained 04/12/2014 showing  multiple bone lesions and compression fracture at T8 with retropulsion and cord compression. On 04/13/2014 she underwent Laminectomy T8 decompression of spinal cord posterior fixation from T6-T10 with pedicle screws and rods posterior arthrodesis with allograft. The pathology from this procedure ([id number]) showed metastatic adenocarcinoma  which was estrogen receptor 69% positive, with moderate staining intensity, progesterone receptor negative. HER-2 could not be obtained.  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 There were nonspecific L temporal bone changes and also possible involvement of the clivus and calvarium, but no other parenchymal brain lesions. Further staging studies included a bone scan, which failed to show the lytic lesion seen on other scans, and CTs of the chest, abdomen and pelvis on 06/08/2011, which showed very small right lung and left liver lesions which will require follow-up  Her subsequent history is as detailed below   PAST MEDICAL HISTORY: Past Medical History:  Diagnosis Date  . Breast cancer ([place]) 09/2011   invasive ductal carcinoma metastatic ca in 3/14 lymph nodes  . History of chemotherapy 06/27/11 -09/04/11   neoadjuvant  . Hx of radiation therapy 11/08/11 -12/26/11   right chest wall/supraclav fossa, right scar  . S/P radiation therapy 05/08/14   SRS brain    PAST SURGICAL HISTORY: Past Surgical History:  Procedure Laterality Date  . brain surgery-LITT Left 11/20/14   LITT procedure for Lt brain met.  Marland Kitchen BREAST BIOPSY     Left  . CHOLECYSTECTOMY N/A 03/01/2014   Procedure: LAPAROSCOPIC CHOLECYSTECTOMY;  Surgeon: Leighton Ruff, MD;  Location: [facility];  Service: General;  Laterality: N/A;  . LAMINECTOMY N/A 04/13/2014   Procedure: THORACIC LAMINECTOMY WITH FIXATION THORACIC SIX-THORACIC TEN FUSION;  Surgeon: Kristeen Miss, MD;  Location: [facility];  Service: Neurosurgery;  Laterality: N/A;  . MODIFIED MASTECTOMY  10/03/2011   Procedure: MODIFIED MASTECTOMY;  Surgeon: Haywood Lasso, MD;  Location: [facility];  Service: General;  Laterality: Right;  . PORT-A-CATH REMOVAL  01/30/2012   Procedure: MINOR REMOVAL PORT-A-CATH;  Surgeon: Haywood Lasso, MD;  Location:  [facility];  Service: General;  Laterality: Left;  . PORTACATH PLACEMENT  06/20/2011   Procedure: INSERTION PORT-A-CATH;  Surgeon: Haywood Lasso, MD;  Location: [facility];  Service: General;  Laterality: N/A;  . ROBOTIC ASSISTED TOTAL HYSTERECTOMY WITH BILATERAL SALPINGO OOPHERECTOMY Bilateral 12/23/2012   Procedure: ROBOTIC ASSISTED TOTAL HYSTERECTOMY WITH BILATERAL SALPINGO OOPHORECTOMY;  Surgeon: Marvene Staff, MD;  Location: [facility];  Service: Gynecology;  Laterality: Bilateral;  . WISDOM TOOTH EXTRACTION      FAMILY HISTORY Family History  Problem Relation Age of Onset  . Breast cancer Maternal Aunt 61  . Pancreatic cancer Maternal Grandfather   . Pancreatic cancer Maternal Aunt     died in her 78s  . Leukemia Maternal Aunt     died in her 50s  . Ovarian cancer Cousin 71    maternal cousin   the patient's father is alive, currently 33 years old. The patient's mother died from complications of diabetes at the age of 34. The patient had no brothers, 4 sisters. One sister has died from congestive heart failure. The patient's mother is one of 5 sisters. One of them was diagnosed with breast cancer the age of 82. Also a cousin on the maternal side it was diagnosed with ovarian cancer. This was approximately age 8. There is also colon  cancer in the family. The patient has had extensive genetic testing summarized below. She is however BRCA negative  GYNECOLOGIC HISTORY:  Patient's last menstrual period was 07/02/2011. Menarche age 17, she is GX P0. She underwent total hysterectomy with bilateral salpingo-oophorectomy December 2014.  SOCIAL HISTORY:  Carolyn [name] works at [employer] for [employer]. She mostly deals with government documents. She is single, lives by herself, with no pets. She attends [employer] locally.    ADVANCED DIRECTIVES: Not in place. The patient has a documents and intends to name her sister Luvada Salamone as healthcare power of attorney. Joelene Millin lives  in [place] and can be reached at [place]: Social History  Substance Use Topics  . Smoking status: Never Smoker  . Smokeless tobacco: Never Used  . Alcohol use No     Colonoscopy:  PAP:  Bone density:  Lipid panel:  Allergies  Allergen Reactions  . Allegra [Fexofenadine] Hives    Abdomen only  . Shellfish Allergy Hives    Abdomen only    Current Outpatient Prescriptions  Medication Sig Dispense Refill  . acetaminophen (TYLENOL) 325 MG tablet Take 2 tablets (650 mg total) by mouth every 6 (six) hours as needed for mild pain, moderate pain, fever or headache. (Patient not taking: Reported on 12/13/2015)    . Calcium Carbonate-Vit D-Min (CALCIUM 1200 PO) Take 1 tablet by mouth 2 (two) times daily.    . Cholecalciferol (VITAMIN D3) 2000 UNITS TABS Take 2,000 Units by mouth daily.    . Multiple Vitamin (MULTIVITAMIN) tablet Take 1 tablet by mouth daily.    . palbociclib (IBRANCE) 75 MG capsule TAKE 1 CAPSULE BY MOUTH every other day for 21 days (total 11 capsules) then off a week 11 capsule 6   No current facility-administered medications for this visit.    Facility-Administered Medications Ordered in Other Visits  Medication Dose Route Frequency Provider Last Rate Last Dose  . technetium medronate (TC-MDP) injection 25 millicurie  25 millicurie Intravenous Once PRN Gilford Silvius, MD        OBJECTIVE: Middle-aged [place] American woman Using a cane to ambulate  Vitals:   01/17/16 0937  BP: 129/72  Pulse: 91  Resp: 18  Temp: 97.8 F (36.6 C)     Body mass index is 19.39 kg/m.    ECOG FS:1 - Symptomatic but completely ambulatory  Sclerae unicteric, EOMs intact Oropharynx clear and moist No cervical or supraclavicular adenopathy Lungs no rales or rhonchi Heart regular rate and rhythm Abd soft, nontender, positive bowel sounds MSK no focal spinal tenderness, no upper extremity lymphedema Neuro: nonfocal, well oriented, appropriate  affect Breasts: The right breast is status post mastectomy, followed by radiation. There is no evidence of local recurrence. The right axilla is benign. The left breast is unremarkable.    LAB RESULTS:  CMP     Component Value Date/Time   NA 143 01/17/2016 0913   K 3.5 01/17/2016 0913   CL 103 04/21/2014 0910   CL 102 01/16/2012 0804   CO2 28 01/17/2016 0913   GLUCOSE 114 01/17/2016 0913   GLUCOSE 92 01/16/2012 0804   BUN 15.7 01/17/2016 0913   CREATININE 0.9 01/17/2016 0913   CALCIUM 10.0 01/17/2016 0913   PROT 7.8 01/17/2016 0913   ALBUMIN 4.3 01/17/2016 0913   AST 29 01/17/2016 0913   ALT 28 01/17/2016 0913   ALKPHOS 175 (H) 01/17/2016 0913   BILITOT 0.27 01/17/2016 0913   GFRNONAA >90 04/21/2014 0910  GFRAA >90 04/21/2014 0910    I No results found for: SPEP  Lab Results  Component Value Date   WBC 3.6 (L) 01/17/2016   NEUTROABS 2.2 01/17/2016   HGB 13.3 01/17/2016   HCT 39.4 01/17/2016   MCV 92.9 01/17/2016   PLT 156 01/17/2016      Chemistry      Component Value Date/Time   NA 143 01/17/2016 0913   K 3.5 01/17/2016 0913   CL 103 04/21/2014 0910   CL 102 01/16/2012 0804   CO2 28 01/17/2016 0913   BUN 15.7 01/17/2016 0913   CREATININE 0.9 01/17/2016 0913      Component Value Date/Time   CALCIUM 10.0 01/17/2016 0913   ALKPHOS 175 (H) 01/17/2016 0913   AST 29 01/17/2016 0913   ALT 28 01/17/2016 0913   BILITOT 0.27 01/17/2016 0913       Lab Results  Component Value Date   LABCA2 24 05/31/2011    No components found for: GYFVC944  No results for input(s): INR in the last 168 hours.  Urinalysis No results found for: COLORURINE  STUDIES: Ct Chest W Contrast  Result Date: 01/14/2016 CLINICAL DATA:  Breast cancer, metastatic disease, radiation therapy. EXAM: CT CHEST WITH CONTRAST TECHNIQUE: Multidetector CT imaging of the chest was performed during intravenous contrast administration. CONTRAST:  11m ISOVUE-300 IOPAMIDOL (ISOVUE-300)  INJECTION 61% COMPARISON:  05/03/2015 FINDINGS: Cardiovascular: Unremarkable Mediastinum/Nodes: No pathologic adenopathy in the chest is identified. Lungs/Pleura: Biapical pleuroparenchymal scarring similar to prior. There is some associated volume loss is specially at the right lung apex. Left paramediastinal density is similar to prior, questionable radiation port. Mild scarring in the lingula. A 0.5 by 0.3 cm left lower lobe pulmonary nodule on image 103 series 5 is stable. Nodularity along the left inferior pulmonary ligament is similarly stable. 2 by 3 mm right lower lobe nodule on image 60/5 is stable. The 3 mm left lower lobe nodule on image 72/5 is stable. 2 mm right lower lobe nodule on image 89/5 stable. 3 mm left lower lobe nodule on image 94/5 is stable. Other tiny nodules are likewise stable. Upper Abdomen: Scattered tiny hypodense hepatic lesions are unchanged, quite likely cysts. Paucity of upper abdominal adipose tissue. Musculoskeletal: Right mastectomy. Right axillary dissection. Metastatic lesions at the T7, T8, and T9 vertebra are generally similar, with compression of T8 and posterior vertebral destruction at T8 with posterior decompression not readily changed. The positioning of the pedicle screws is likewise not changed. A metastatic lesion of the right T12 vertebral body and pedicle is similar to prior but there are new metastatic lesions in the T12 vertebral body including a 1.2 cm lucent lesion eccentric to the right and a 1.7 cm mixed density lesion centrally in addition no others. New 9 mm lucency posteriorly in the left L1 vertebral body IMPRESSION: 1. Newly visible metastatic lesions at T12 and L1 vertebral levels. Prior metastatic lesions at T7, T8, and T9 noted 2. Stable scattered tiny pulmonary nodules in the lungs. Electronically Signed   By: WVan ClinesM.D.   On: 01/14/2016 11:22   Nm Bone Scan Whole Body  Result Date: 01/14/2016 CLINICAL DATA:  Metastatic RIGHT breast  cancer, ER positive EXAM: NUCLEAR MEDICINE WHOLE BODY BONE SCAN TECHNIQUE: Whole body anterior and posterior images were obtained approximately 3 hours after intravenous injection of radiopharmaceutical. RADIOPHARMACEUTICALS:  20 mCi Technetium-943mDP IV COMPARISON:  05/03/2015 Radiographic correlation: CT chest 01/14/2016 FINDINGS: Abnormal foci of increased tracer localization are seen at the  calvarial vertex, a lower LEFT anterior rib, 2 posterior LEFT ribs, and at multiple levels in the thoracic spine approximately T12, T9, and an indeterminate level in the upper thoracic spine compatible with compatible with osseous metastatic disease. Findings are progressive since previous exam. Photopenic defect at mid thoracic spine corresponds to overlying metallic hardware dorsal to T8 vertebral body. Uptake laterally in the LEFT cervical spine could be related to degenerative changes or less likely metastasis. No additional sites of abnormal osseous tracer accumulation identified. Expected urinary tract and soft tissue distribution of tracer. IMPRESSION: Progressive osseous metastatic disease since 05/03/2015. Electronically Signed   By: Lavonia Dana M.D.   On: 01/14/2016 13:01    ASSESSMENT: 46 y.o. BRCA negative Carolyn [name] woman status post right breast upper outer quadrant and right axillary lymph node biopsy 05/18/2011, both positive for an invasive ductal carcinoma, high-grade, clinicallyT2 N1-2 or stage IIB/IIIA,  estrogen receptor 100% positive, progesterone receptor 87% positive, with an MIB-1 of 14% and no HER-2 amplification ([id number]).  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   (a) VUS were also found in [place] and BARD1  (2) additional right breast biopsy upper inner quadrant 06/08/2011 showed only a fibroadenoma, and central left breast biopsy for another suspicious lesion showed only fibrocystic changes ([id number] and [id number]).    (3)Treated neoadjuvantly with cyclophosphamide and docetaxel x4 completed 08/29/2011.  (4) status post right modified radical mastectomy 10/03/2011 showing a residual pT1c pN1a (3/18 lymph nodes positive) invasive ductal carcinoma, grade 1,estrogen receptor 100% positive, progesterone receptor negative, with no HER-2 amplification ([id number])  (5) adjuvant radiation to the right chest wall, right supraclavicular fossa and right scar completed 12/26/2011  (6) tamoxifen started January 2014-- discontinued April 2016 with evidence of metastatic disease  (7) status post total hysterectomy with bilateral salpingo-oophorectomy 12/23/2012 with benign pathology ([id number])  METASTATIC DISEASE 04/13/2014 (8) presented with T8 cord compression and underwent laminectomy 04/13/2014 with T8 decompression of spinal cord, posterior fixation from T6-T10 with pedicle screws and rods, posterior arthrodesis with allograft. 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   (9) additional staging studies showed (a) multiple bone lesions, mostly lytic (so not well seen on bone scan) (b) single 0.7 cm brain metastasis at L centrum semiovale 04/29/2014  (c) RUL (39m) and RLL (274m pulmonary nodules  (d) small left liver lesions, possibly mew  (10) RADIATION IN METASTATIC SETTING:  (a) SRS to Lt Post Centrum Semiovale to 20 Gy given 04/29/2014. ExacTrac Snap verification was performed for each couch angle.   (b) radiation to the T-spine completed 05/08/2014.  (c) "Auto-LITT" procedure performed at [facility] 11/20/14  (d) SRS to 0.4 cm left parietal lesion 12/23/2015  (11) started fulvestrant 05/18/2014 and Palbociclib 06/01/2014  (a) Palbociclib dose decreased to 100 mg daily, 21/7 as of 08/05/2014  (b) palbociclib dose decreased to 75 mg daily, 21/7, starting May 2017  (c) palbociclib dose decreased to 75 mg  every other day beginning 07/26/2015    (d) palbociclib held for month of October because of persistent low counts, resumed November  (12) started zolendronate 05/18/2014, repeated every 12 weeks  (a) July zolendronate dose held because of dental issues, resumed 10/18/2015  (b)   PLAN:  WeSakoyaad a minimal area of recurrence to the brain dealt with in's December 2017, which she tolerated well. She is scheduled for repeat brain MRI some time in February.  We spent  the better part of today's 30 minute visit discussing her general situation and the results of her CT scan and bone scan. The good news is that we don't see evidence of visceral disease in the lungs or liver. We do see evidence of progression in the bones.  There are many ways to address this but certainly we have to either intensify or change treatments. I think intensifying treatment is the better option here so we are going to add anastrozole to her fulvestrant and Faslodex treatments. We discussed the side effects toxicities and complications of that agent in detail today and I went ahead and placed the prescription in her pharmacy.  Were going to change her Zometa to denosumab/Xgeva. She will receive this every 4 weeks. The first dose will be later this week because we need to get insurance preapproval but after that she will receive it on the same time as her Faslodex shots. This means every 4 weeks she would have labs for her Ibrance dosing, Faslodex and Xgeva.  She will then return to see me early March. If all is going well I will restage her later that month  She understands the fatigue she is experiencing is most likely due to the [place] but unfortunately almost everything else that I can give her will have more side effects not less. I encouraged her to be as active as she can, which is something she very much is compliant with. In addition the pain in the left buttock area is very intermittent and when it gets a little worse  it easily controlled on Aleve. At this point we are not proceeding to radiation to that.  She knows to call for any problems that may develop before her next visit here. Carolyn Cruel, MD   01/17/2016 10:02 AM

## 2016-01-17 NOTE — Patient Instructions (Signed)

## 2016-01-17 NOTE — Progress Notes (Signed)
Faslodex Injection was given in Infusion Room

## 2016-01-20 ENCOUNTER — Ambulatory Visit (HOSPITAL_BASED_OUTPATIENT_CLINIC_OR_DEPARTMENT_OTHER): Payer: BC Managed Care – PPO

## 2016-01-20 VITALS — BP 116/66 | HR 85 | Temp 97.6°F | Resp 18

## 2016-01-20 DIAGNOSIS — C773 Secondary and unspecified malignant neoplasm of axilla and upper limb lymph nodes: Secondary | ICD-10-CM

## 2016-01-20 DIAGNOSIS — C7931 Secondary malignant neoplasm of brain: Secondary | ICD-10-CM

## 2016-01-20 DIAGNOSIS — C7951 Secondary malignant neoplasm of bone: Secondary | ICD-10-CM

## 2016-01-20 DIAGNOSIS — C50411 Malignant neoplasm of upper-outer quadrant of right female breast: Secondary | ICD-10-CM | POA: Diagnosis not present

## 2016-01-20 DIAGNOSIS — C50919 Malignant neoplasm of unspecified site of unspecified female breast: Secondary | ICD-10-CM

## 2016-01-20 MED ORDER — DENOSUMAB 120 MG/1.7ML ~~LOC~~ SOLN
120.0000 mg | Freq: Once | SUBCUTANEOUS | Status: AC
Start: 2016-01-20 — End: 2016-01-20
  Administered 2016-01-20: 120 mg via SUBCUTANEOUS
  Filled 2016-01-20: qty 1.7

## 2016-01-20 NOTE — Progress Notes (Signed)
°  Radiation Oncology         [phone] ________________________________  Name: Carolyn [name] MRN: [id number]  Date: 12/23/2015  DOB: 02-25-1970  End of Treatment Note  Diagnosis:   Progressive Stage IIB, T1c, N1a invasive ductal carcinoma of the right breast with metastatic disease to brain.  Indication for treatment:  palliative       Radiation treatment dates:   12/23/2015  Site/dose:    PTV2 Right parietal was treated to 20 Gy in 1 fraction.  Beams/energy: SBRT/SRT-3D // 6FFF  Narrative: The patient tolerated radiation treatment well.   There were no signs of acute toxicity after treatment.  Plan: The patient has completed radiation treatment. The patient will return to radiation oncology clinic for routine followup in one month. I advised the patient to call or return sooner if they have any questions or concerns related to their recovery or treatment. ________________________________   [name], MD, PhD  This document serves as a record of services personally performed by Kyung Rudd, MD. It was created on his behalf by Arlyce Harman, a trained medical scribe. The creation of this record is based on the scribe's personal observations and the provider's statements to them. This document has been checked and approved by the attending provider.

## 2016-01-20 NOTE — Patient Instructions (Signed)
Denosumab injection What is this medicine? DENOSUMAB (den oh sue mab) slows bone breakdown. Prolia is used to treat osteoporosis in women after menopause and in men. Xgeva is used to prevent bone fractures and other bone problems caused by cancer bone metastases. Xgeva is also used to treat giant cell tumor of the bone. COMMON BRAND NAME(S): Prolia, XGEVA What should I tell my health care provider before I take this medicine? They need to know if you have any of these conditions: -dental disease -eczema -infection or history of infections -kidney disease or on dialysis -low blood calcium or vitamin D -malabsorption syndrome -scheduled to have surgery or tooth extraction -taking medicine that contains denosumab -thyroid or parathyroid disease -an unusual reaction to denosumab, other medicines, foods, dyes, or preservatives -pregnant or trying to get pregnant -breast-feeding How should I use this medicine? This medicine is for injection under the skin. It is given by a health care professional in a hospital or clinic setting. If you are getting Prolia, a special MedGuide will be given to you by the pharmacist with each prescription and refill. Be sure to read this information carefully each time. For Prolia, talk to your pediatrician regarding the use of this medicine in children. Special care may be needed. For Xgeva, talk to your pediatrician regarding the use of this medicine in children. While this drug may be prescribed for children as young as 13 years for selected conditions, precautions do apply. What if I miss a dose? It is important not to miss your dose. Call your doctor or health care professional if you are unable to keep an appointment. What may interact with this medicine? Do not take this medicine with any of the following medications: -other medicines containing denosumab This medicine may also interact with the following medications: -medicines that suppress the immune  system -medicines that treat cancer -steroid medicines like prednisone or cortisone What should I watch for while using this medicine? Visit your doctor or health care professional for regular checks on your progress. Your doctor or health care professional may order blood tests and other tests to see how you are doing. Call your doctor or health care professional if you get a cold or other infection while receiving this medicine. Do not treat yourself. This medicine may decrease your body's ability to fight infection. You should make sure you get enough calcium and vitamin D while you are taking this medicine, unless your doctor tells you not to. Discuss the foods you eat and the vitamins you take with your health care professional. See your dentist regularly. Brush and floss your teeth as directed. Before you have any dental work done, tell your dentist you are receiving this medicine. Do not become pregnant while taking this medicine or for 5 months after stopping it. Women should inform their doctor if they wish to become pregnant or think they might be pregnant. There is a potential for serious side effects to an unborn child. Talk to your health care professional or pharmacist for more information. What side effects may I notice from receiving this medicine? Side effects that you should report to your doctor or health care professional as soon as possible: -allergic reactions like skin rash, itching or hives, swelling of the face, lips, or tongue -breathing problems -chest pain -fast, irregular heartbeat -feeling faint or lightheaded, falls -fever, chills, or any other sign of infection -muscle spasms, tightening, or twitches -numbness or tingling -skin blisters or bumps, or is dry, peels, or red -slow   healing or unexplained pain in the mouth or jaw -unusual bleeding or bruising Side effects that usually do not require medical attention (report to your doctor or health care professional  if they continue or are bothersome): -muscle pain -stomach upset, gas Where should I keep my medicine? This medicine is only given in a clinic, doctor's office, or other health care setting and will not be stored at home.  2017 Elsevier/Gold Standard (2015-01-28 10:06:55)  

## 2016-01-26 NOTE — Progress Notes (Deleted)
Miss Mersadie Kavanaugh. Lacewell 46 y.o. 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                                                                                                                                                                         denosumab/Xgeva. She will receive this every 4 weeks. The first dose will be later this week because we need to get insurance preapproval but after that she will receive it on the same time as her Faslodex shots. This means every 4 weeks she would have labs for her Ibrance dosing, Faslodex and Xgeva.

## 2016-01-26 NOTE — Progress Notes (Signed)
Miss Carolyn Grissom. [name] 46 y.o. 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           Headache:None Dizziness:While having radiation, none recently. Nausea/vomiting:No Diplopia:No Ringing in ears:No Visual changes:Wears glasses,No changes Fatigue:Having fatigue in the afternoons. Cognitive changes: None                                                                                                                                                            Chemotherapy: Delton See will receive every 4 weeks.  Every 4 weeks she would have labs for her Ibrance dosing, Faslodex shots and Xgeva. Wt Readings from Last 3 Encounters:  01/31/16 125 lb 9.6 oz (57 kg)  01/17/16 127 lb 8 oz (57.8 kg)  12/23/15 125 lb 9.6 oz (57 kg)  BP 123/75   Pulse 86   Temp 97.9 F (36.6 C) (Oral)   Resp 18   Ht '5\' 8"'  (1.727 m)   Wt 125 lb 9.6 oz (57 kg)   LMP 07/02/2011   SpO2 100%   BMI 19.10 kg/m

## 2016-01-31 ENCOUNTER — Ambulatory Visit
Admission: RE | Admit: 2016-01-31 | Discharge: 2016-01-31 | Disposition: A | Discharge status: Home / Self Care | Payer: BC Managed Care – PPO | Source: Ambulatory Visit | Attending: Radiation Oncology | Admitting: Radiation Oncology

## 2016-01-31 ENCOUNTER — Encounter: Payer: Self-pay | Admitting: Radiation Oncology

## 2016-01-31 VITALS — BP 123/75 | HR 86 | Temp 97.9°F | Resp 18 | Ht 68.0 in | Wt 125.6 lb

## 2016-01-31 DIAGNOSIS — C7951 Secondary malignant neoplasm of bone: Secondary | ICD-10-CM

## 2016-01-31 DIAGNOSIS — Z79899 Other long term (current) drug therapy: Secondary | ICD-10-CM | POA: Insufficient documentation

## 2016-01-31 DIAGNOSIS — Z888 Allergy status to other drugs, medicaments and biological substances status: Secondary | ICD-10-CM | POA: Insufficient documentation

## 2016-01-31 DIAGNOSIS — C7931 Secondary malignant neoplasm of brain: Secondary | ICD-10-CM | POA: Diagnosis present

## 2016-01-31 DIAGNOSIS — C50911 Malignant neoplasm of unspecified site of right female breast: Secondary | ICD-10-CM | POA: Diagnosis not present

## 2016-01-31 DIAGNOSIS — C50919 Malignant neoplasm of unspecified site of unspecified female breast: Secondary | ICD-10-CM

## 2016-01-31 NOTE — Progress Notes (Signed)
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  04/29/2014 SRS Treatment:  PTV1 Left parietal lesion to a dose of 20 gray in 1 fraction  04/27/14-05/08/14:  Palliative radiotherapy to the thoracic spine 30 Gy in 10 fractions  11/08/2011-12/26/2011: Right chest wall / 50.4 Gray @ 1.8 [place] Cables per fraction x 28 fractions Right Supraclavicular fossa / 45 Gray '@1' .8 Gray per fraction x 25 fractions Right scar / 10 Gray at [employer] per fraction x 5 fractions  Narrative:  The patient returns today for routine follow-up. This is a young patient who was diagnosed with metastatic right breast cancer to the brain who underwent stereotactic radiosurgery to the brain lesion in 2016. She continued to have edema and subsequently was referred to Dr. Salomon Fick at [facility]. She underwent LITT on 11/20/14. She has been followed closely since. She saw Dr. Salomon Fick 09/24/15 at [facility], and requests that he be copied on her most recent MRI on 12/09/15. This study didn't reveal any new metastatic deposit in the right parietal region measuring 4 mm. No other evidence of the disease is present, and her previously treated lesion in the left parietal region is stable. She proceeded with another course of SRS and comes today for a follow up. Of note she was still found to have disease in the thoracic spine on CT imaging, as well as disease within the left posterior lytic ribs and anterior rib at multiple levels within the thoracic spine T12, and T9 an indeterminate  level of upper thoracic spine. These findings were progressive since her last treatment.  On review of systems, the patient reports that she is doing very well overall. She's had some mild dizziness but denies any auditory or visual disturbances. She denies any falls, or headaches. She continues to walk with a cane. She denies any thoracic back pain. She does state that she notes some stiffness in her left hip, but with movement this seems to improve. No other complaints or verbalized.                             ALLERGIES:  is allergic to allegra [fexofenadine] and shellfish allergy.  Meds: Current Outpatient Prescriptions  Medication Sig Dispense Refill  . acetaminophen (TYLENOL) 325 MG tablet Take 2 tablets (650 mg total) by mouth every 6 (six) hours as needed for mild pain, moderate pain, fever or headache.    . Calcium Carbonate-Vit D-Min (CALCIUM 1200 PO) Take 1 tablet by mouth 2 (two) times daily.    . Cholecalciferol (VITAMIN D3) 2000 UNITS TABS Take 2,000 Units by mouth daily.    . Multiple Vitamin (MULTIVITAMIN) tablet Take 1 tablet by mouth daily.    . palbociclib (IBRANCE) 75 MG capsule TAKE 1 CAPSULE BY MOUTH every other day for 21 days (total 11 capsules) then off a week 11 capsule 6   No current facility-administered medications for this encounter.     Physical Findings:  height is '5\' 8"'  (1.727 m)  and weight is 125 lb 9.6 oz (57 kg). Her oral temperature is 97.9 F (36.6 C). Her blood pressure is 123/75 and her pulse is 86. Her respiration is 18 and oxygen saturation is 100%.   In general this is a well appearing African-American female in no acute distress. She's alert and oriented x4 and appropriate throughout the examination. Cardiopulmonary assessment is negative for acute distress and she exhibits normal effort. She appears to be neurologically intact grossly without focal deficits.    Lab Findings: Lab Results  Component Value Date   WBC 3.6 (L) 01/17/2016   HGB  13.3 01/17/2016   HCT 39.4 01/17/2016   MCV 92.9 01/17/2016   PLT 156 01/17/2016     Radiographic Findings: Ct Chest W Contrast  Result Date: 01/14/2016 CLINICAL DATA:  Breast cancer, metastatic disease, radiation therapy. EXAM: CT CHEST WITH CONTRAST TECHNIQUE: Multidetector CT imaging of the chest was performed during intravenous contrast administration. CONTRAST:  91m ISOVUE-300 IOPAMIDOL (ISOVUE-300) INJECTION 61% COMPARISON:  05/03/2015 FINDINGS: Cardiovascular: Unremarkable Mediastinum/Nodes: No pathologic adenopathy in the chest is identified. Lungs/Pleura: Biapical pleuroparenchymal scarring similar to prior. There is some associated volume loss is specially at the right lung apex. Left paramediastinal density is similar to prior, questionable radiation port. Mild scarring in the lingula. A 0.5 by 0.3 cm left lower lobe pulmonary nodule on image 103 series 5 is stable. Nodularity along the left inferior pulmonary ligament is similarly stable. 2 by 3 mm right lower lobe nodule on image 60/5 is stable. The 3 mm left lower lobe nodule on image 72/5 is stable. 2 mm right lower lobe nodule on image 89/5 stable. 3 mm left lower lobe nodule on image 94/5 is stable. Other tiny nodules are likewise stable. Upper Abdomen: Scattered tiny hypodense hepatic lesions are unchanged, quite likely cysts. Paucity of upper abdominal adipose tissue. Musculoskeletal: Right mastectomy. Right axillary dissection. Metastatic lesions at the T7, T8, and T9 vertebra are generally similar, with compression of T8 and posterior vertebral destruction at T8 with posterior decompression not readily changed. The positioning of the pedicle screws is likewise not changed. A metastatic lesion of the right T12 vertebral body and pedicle is similar to prior but there are new metastatic lesions in the T12 vertebral body including a 1.2 cm lucent lesion eccentric to the right and a 1.7 cm mixed density lesion centrally in addition no  others. New 9 mm lucency posteriorly in the left L1 vertebral body IMPRESSION: 1. Newly visible metastatic lesions at T12 and L1 vertebral levels. Prior metastatic lesions at T7, T8, and T9 noted 2. Stable scattered tiny pulmonary nodules in the lungs. Electronically Signed   By: WVan ClinesM.D.   On: 01/14/2016 11:22   Nm Bone Scan Whole Body  Result Date: 01/14/2016 CLINICAL DATA:  Metastatic RIGHT breast cancer, ER positive EXAM: NUCLEAR MEDICINE WHOLE BODY BONE SCAN TECHNIQUE: Whole body anterior and posterior images were obtained approximately 3 hours after intravenous injection of radiopharmaceutical. RADIOPHARMACEUTICALS:  20 mCi Technetium-932mDP IV COMPARISON:  05/03/2015 Radiographic correlation: CT chest 01/14/2016 FINDINGS: Abnormal foci of increased tracer localization are seen at the calvarial vertex, a lower LEFT anterior rib, 2 posterior LEFT ribs, and at multiple levels in the thoracic spine approximately T12, T9, and an indeterminate level in the upper thoracic spine compatible with compatible with osseous metastatic disease. Findings are progressive since previous exam. Photopenic defect at mid thoracic spine corresponds to overlying metallic hardware dorsal to T8 vertebral body. Uptake laterally in the LEFT  cervical spine could be related to degenerative changes or less likely metastasis. No additional sites of abnormal osseous tracer accumulation identified. Expected urinary tract and soft tissue distribution of tracer. IMPRESSION: Progressive osseous metastatic disease since 05/03/2015. Electronically Signed   By: Lavonia Dana M.D.   On: 01/14/2016 13:01    Impression/Plan: 1. 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 The patient is doing well since completing radiotherapy. We will ask her to return in two months for her next MRI of the brain. She will continue Ibrance with Dr. [name], and recently changed to  [facility] for bone protection. She will keep [place] informed of any questions or concerns that arise prior to her next visit.     Carola Rhine, PAC

## 2016-02-02 NOTE — Progress Notes (Signed)
  Radiation Oncology         [phone] ________________________________  Name: Carolyn [name] MRN: [id number]  Date: 12/15/2015  DOB: Dec 07, 1970  DIAGNOSIS:     ICD-9-CM ICD-10-CM   1. Brain metastasis ([place]) 198.3 C79.31     NARRATIVE:  The patient was brought to the [facility].  Identity was confirmed.  All relevant records and images related to the planned course of therapy were reviewed.  The patient freely provided informed written consent to proceed with treatment after reviewing the details related to the planned course of therapy. The consent form was witnessed and verified by the simulation staff. Intravenous access was established for contrast administration. Then, the patient was set-up in a stable reproducible supine position for radiation therapy.  A relocatable thermoplastic stereotactic head frame was fabricated for precise immobilization.  CT images were obtained.  Surface markings were placed.  The CT images were loaded into the planning software and fused with the patient's targeting MRI scan.  Then the target and avoidance structures were contoured.  Treatment planning then occurred.  The radiation prescription was entered and confirmed.  I have requested 3D planning  I have requested a DVH of the following structures: Brain stem, brain, left eye, right eye, lenses, optic chiasm, target volumes, uninvolved brain, and normal tissue.    SPECIAL TREATMENT PROCEDURE:  The planned course of therapy using radiation constitutes a special treatment procedure. Special care is required in the management of this patient for the following reasons. This treatment constitutes a Special Treatment Procedure for the following reason: High dose per fraction requiring special monitoring for increased toxicities of treatment including daily imaging.  The special nature of the planned course of radiotherapy will require increased physician supervision and oversight to ensure patient's  safety with optimal treatment outcomes.  PLAN:  The patient will receive 20 Gy in 1 fraction.   ------------------------------------------------  [name], MD, PhD

## 2016-02-02 NOTE — Progress Notes (Signed)
  Radiation Oncology         [phone] ________________________________  Name: Carolyn [name] MRN: [id number]  Date: 12/23/2015  DOB: 1970/07/03   SPECIAL TREATMENT PROCEDURE   3D TREATMENT PLANNING AND DOSIMETRY: The patient's radiation plan was reviewed and approved by neurosurgery and radiation oncology prior to treatment. It showed 3-dimensional radiation distributions overlaid onto the planning CT/MRI image set. The [facility] [place] for the target structures as well as the organs at risk were reviewed. The documentation of the 3D plan and dosimetry are filed in the radiation oncology EMR.   NARRATIVE: The patient was brought to the TrueBeam stereotactic radiation treatment machine and placed supine on the CT couch. The head frame was applied, and the patient was set up for stereotactic radiosurgery. Neurosurgery was present for the set-up and delivery   SIMULATION VERIFICATION: In the couch zero-angle position, the patient underwent Exactrac imaging using the Brainlab system with orthogonal KV images. These were carefully aligned and repeated to confirm treatment position for each of the isocenters. The Exactrac snap film verification was repeated at each couch angle.   SPECIAL TREATMENT PROCEDURE: The patient received stereotactic radiosurgery to the following target:  PTV2 right parietal target was treated using 3 Arcs to a prescription dose of 20 Gy. ExacTrac Snap verification was performed for each couch angle.   STEREOTACTIC TREATMENT MANAGEMENT: Following delivery, the patient was transported to nursing in stable condition and monitored for possible acute effects. Vital signs were recorded . The patient tolerated treatment without significant acute effects, and was discharged to home in stable condition.  PLAN: Follow-up in one month.   ------------------------------------------------  [name], MD, PhD

## 2016-02-03 ENCOUNTER — Other Ambulatory Visit: Payer: Self-pay | Admitting: Radiation Therapy

## 2016-02-03 DIAGNOSIS — C7931 Secondary malignant neoplasm of brain: Secondary | ICD-10-CM

## 2016-02-03 DIAGNOSIS — C7949 Secondary malignant neoplasm of other parts of nervous system: Principal | ICD-10-CM

## 2016-02-11 ENCOUNTER — Telehealth: Payer: Self-pay | Admitting: *Deleted

## 2016-02-11 ENCOUNTER — Other Ambulatory Visit: Payer: Self-pay | Admitting: *Deleted

## 2016-02-11 MED FILL — IBRANCE 75 MG CAPSULE: 75 | 28 days supply | Qty: 11 | Fill #4

## 2016-02-11 NOTE — Telephone Encounter (Signed)
This RN contacted the [facility] outpt Phx per pt's VM stating increased co pay for Ibrance.  Per call to [place] is $ 250 dollars - but noted discount card not applied yet which brings the cost down to the patient to $0 co pay. Prescription is available for pick up.  This RN returned call to pt and left message per above on identified VM.

## 2016-02-11 NOTE — Telephone Encounter (Signed)
Pt called reporting she developed a cough since last night.  Stated just dry cough, denied fever, denied pain.  Stated have runny nose with clear mucus.  Pt wanted to know if she could take Mucinex OTC to help  With the cough. Informed pt that ok to use Mucinex for cough;  Increased po fluid intake as much as tolerated;  Monitor for increased temp, and/or productive cough with change in color.  Pt understood to call MD on call , and/or go to ER if symptoms worsened.

## 2016-02-14 ENCOUNTER — Other Ambulatory Visit: Payer: Self-pay | Admitting: Emergency Medicine

## 2016-02-14 ENCOUNTER — Ambulatory Visit (HOSPITAL_BASED_OUTPATIENT_CLINIC_OR_DEPARTMENT_OTHER): Payer: BC Managed Care – PPO

## 2016-02-14 ENCOUNTER — Other Ambulatory Visit (HOSPITAL_BASED_OUTPATIENT_CLINIC_OR_DEPARTMENT_OTHER): Payer: BC Managed Care – PPO

## 2016-02-14 VITALS — BP 110/69 | HR 82 | Temp 97.6°F | Resp 20

## 2016-02-14 DIAGNOSIS — C50411 Malignant neoplasm of upper-outer quadrant of right female breast: Secondary | ICD-10-CM | POA: Diagnosis not present

## 2016-02-14 DIAGNOSIS — C773 Secondary and unspecified malignant neoplasm of axilla and upper limb lymph nodes: Secondary | ICD-10-CM

## 2016-02-14 DIAGNOSIS — Z170 Estrogen receptor positive status [ER+]: Principal | ICD-10-CM

## 2016-02-14 DIAGNOSIS — C50919 Malignant neoplasm of unspecified site of unspecified female breast: Secondary | ICD-10-CM

## 2016-02-14 DIAGNOSIS — C7951 Secondary malignant neoplasm of bone: Secondary | ICD-10-CM

## 2016-02-14 DIAGNOSIS — C7931 Secondary malignant neoplasm of brain: Secondary | ICD-10-CM

## 2016-02-14 DIAGNOSIS — Z5111 Encounter for antineoplastic chemotherapy: Secondary | ICD-10-CM

## 2016-02-14 LAB — CBC WITH DIFFERENTIAL/PLATELET
BASO%: 0.6 % (ref 0.0–2.0)
Basophils Absolute: 0 10*3/uL (ref 0.0–0.1)
EOS ABS: 0.1 10*3/uL (ref 0.0–0.5)
EOS%: 1.6 % (ref 0.0–7.0)
HCT: 37.6 % (ref 34.8–46.6)
HEMOGLOBIN: 12.8 g/dL (ref 11.6–15.9)
LYMPH%: 27.4 % (ref 14.0–49.7)
MCH: 31.9 pg (ref 25.1–34.0)
MCHC: 34 g/dL (ref 31.5–36.0)
MCV: 93.9 fL (ref 79.5–101.0)
MONO#: 0.4 10*3/uL (ref 0.1–0.9)
MONO%: 8.9 % (ref 0.0–14.0)
NEUT%: 61.5 % (ref 38.4–76.8)
NEUTROS ABS: 2.5 10*3/uL (ref 1.5–6.5)
Platelets: 143 10*3/uL — ABNORMAL LOW (ref 145–400)
RBC: 4.01 10*6/uL (ref 3.70–5.45)
RDW: 13.9 % (ref 11.2–14.5)
WBC: 4.1 10*3/uL (ref 3.9–10.3)
lymph#: 1.1 10*3/uL (ref 0.9–3.3)

## 2016-02-14 LAB — COMPREHENSIVE METABOLIC PANEL
ALBUMIN: 4.2 g/dL (ref 3.5–5.0)
ALT: 16 U/L (ref 0–55)
AST: 22 U/L (ref 5–34)
Alkaline Phosphatase: 145 U/L (ref 40–150)
Anion Gap: 8 mEq/L (ref 3–11)
BILIRUBIN TOTAL: 0.31 mg/dL (ref 0.20–1.20)
BUN: 10.8 mg/dL (ref 7.0–26.0)
CO2: 28 mEq/L (ref 22–29)
CREATININE: 0.9 mg/dL (ref 0.6–1.1)
Calcium: 9.9 mg/dL (ref 8.4–10.4)
Chloride: 107 mEq/L (ref 98–109)
GLUCOSE: 118 mg/dL (ref 70–140)
Potassium: 4.1 mEq/L (ref 3.5–5.1)
SODIUM: 143 meq/L (ref 136–145)
TOTAL PROTEIN: 7.6 g/dL (ref 6.4–8.3)

## 2016-02-14 MED ORDER — DENOSUMAB 120 MG/1.7ML ~~LOC~~ SOLN
120.0000 mg | Freq: Once | SUBCUTANEOUS | Status: AC
  Administered 2016-02-14: 120 mg via SUBCUTANEOUS
  Filled 2016-02-14: qty 1.7

## 2016-02-14 MED ORDER — FULVESTRANT 250 MG/5ML IM SOLN
500.0000 mg | Freq: Once | INTRAMUSCULAR | Status: AC
  Administered 2016-02-14: 500 mg via INTRAMUSCULAR
  Filled 2016-02-14: qty 10

## 2016-02-14 NOTE — Patient Instructions (Signed)
Fulvestrant injection What is this medicine? FULVESTRANT (ful VES trant) blocks the effects of estrogen. It is used to treat breast cancer. This medicine may be used for other purposes; ask your health care provider or pharmacist if you have questions. COMMON BRAND NAME(S): FASLODEX What should I tell my health care provider before I take this medicine? They need to know if you have any of these conditions: -bleeding problems -liver disease -low levels of platelets in the blood -an unusual or allergic reaction to fulvestrant, other medicines, foods, dyes, or preservatives -pregnant or trying to get pregnant -breast-feeding How should I use this medicine? This medicine is for injection into a muscle. It is usually given by a health care professional in a hospital or clinic setting. Talk to your pediatrician regarding the use of this medicine in children. Special care may be needed. Overdosage: If you think you have taken too much of this medicine contact a poison control center or emergency room at once. NOTE: This medicine is only for you. Do not share this medicine with others. What if I miss a dose? It is important not to miss your dose. Call your doctor or health care professional if you are unable to keep an appointment. What may interact with this medicine? -medicines that treat or prevent blood clots like warfarin, enoxaparin, and dalteparin This list may not describe all possible interactions. Give your health care provider a list of all the medicines, herbs, non-prescription drugs, or dietary supplements you use. Also tell them if you smoke, drink alcohol, or use illegal drugs. Some items may interact with your medicine. What should I watch for while using this medicine? Your condition will be monitored carefully while you are receiving this medicine. You will need important blood work done while you are taking this medicine. Do not become pregnant while taking this medicine or for  at least 1 year after stopping it. Women of child-bearing potential will need to have a negative pregnancy test before starting this medicine. Women should inform their doctor if they wish to become pregnant or think they might be pregnant. There is a potential for serious side effects to an unborn child. Men should inform their doctors if they wish to father a child. This medicine may lower sperm counts. Talk to your health care professional or pharmacist for more information. Do not breast-feed an infant while taking this medicine or for 1 year after the last dose. What side effects may I notice from receiving this medicine? Side effects that you should report to your doctor or health care professional as soon as possible: -allergic reactions like skin rash, itching or hives, swelling of the face, lips, or tongue -feeling faint or lightheaded, falls -pain, tingling, numbness, or weakness in the legs -signs and symptoms of infection like fever or chills; cough; flu-like symptoms; sore throat -vaginal bleeding Side effects that usually do not require medical attention (report to your doctor or health care professional if they continue or are bothersome): -aches, pains -constipation -diarrhea -headache -hot flashes -nausea, vomiting -pain at site where injected -stomach pain This list may not describe all possible side effects. Call your doctor for medical advice about side effects. You may report side effects to FDA at 1-800-FDA-1088. Where should I keep my medicine? This drug is given in a hospital or clinic and will not be stored at home. NOTE: This sheet is a summary. It may not cover all possible information. If you have questions about this medicine, talk to your   doctor, pharmacist, or health care provider.  2017 Elsevier/Gold Standard (2014-07-24 11:03:55) Denosumab injection What is this medicine? DENOSUMAB (den oh sue mab) slows bone breakdown. Prolia is used to treat osteoporosis in  women after menopause and in men. Xgeva is used to prevent bone fractures and other bone problems caused by cancer bone metastases. Xgeva is also used to treat giant cell tumor of the bone. COMMON BRAND NAME(S): Prolia, XGEVA What should I tell my health care provider before I take this medicine? They need to know if you have any of these conditions: -dental disease -eczema -infection or history of infections -kidney disease or on dialysis -low blood calcium or vitamin D -malabsorption syndrome -scheduled to have surgery or tooth extraction -taking medicine that contains denosumab -thyroid or parathyroid disease -an unusual reaction to denosumab, other medicines, foods, dyes, or preservatives -pregnant or trying to get pregnant -breast-feeding How should I use this medicine? This medicine is for injection under the skin. It is given by a health care professional in a hospital or clinic setting. If you are getting Prolia, a special MedGuide will be given to you by the pharmacist with each prescription and refill. Be sure to read this information carefully each time. For Prolia, talk to your pediatrician regarding the use of this medicine in children. Special care may be needed. For Xgeva, talk to your pediatrician regarding the use of this medicine in children. While this drug may be prescribed for children as young as 13 years for selected conditions, precautions do apply. What if I miss a dose? It is important not to miss your dose. Call your doctor or health care professional if you are unable to keep an appointment. What may interact with this medicine? Do not take this medicine with any of the following medications: -other medicines containing denosumab This medicine may also interact with the following medications: -medicines that suppress the immune system -medicines that treat cancer -steroid medicines like prednisone or cortisone What should I watch for while using this  medicine? Visit your doctor or health care professional for regular checks on your progress. Your doctor or health care professional may order blood tests and other tests to see how you are doing. Call your doctor or health care professional if you get a cold or other infection while receiving this medicine. Do not treat yourself. This medicine may decrease your body's ability to fight infection. You should make sure you get enough calcium and vitamin D while you are taking this medicine, unless your doctor tells you not to. Discuss the foods you eat and the vitamins you take with your health care professional. See your dentist regularly. Brush and floss your teeth as directed. Before you have any dental work done, tell your dentist you are receiving this medicine. Do not become pregnant while taking this medicine or for 5 months after stopping it. Women should inform their doctor if they wish to become pregnant or think they might be pregnant. There is a potential for serious side effects to an unborn child. Talk to your health care professional or pharmacist for more information. What side effects may I notice from receiving this medicine? Side effects that you should report to your doctor or health care professional as soon as possible: -allergic reactions like skin rash, itching or hives, swelling of the face, lips, or tongue -breathing problems -chest pain -fast, irregular heartbeat -feeling faint or lightheaded, falls -fever, chills, or any other sign of infection -muscle spasms, tightening, or twitches -numbness   or tingling -skin blisters or bumps, or is dry, peels, or red -slow healing or unexplained pain in the mouth or jaw -unusual bleeding or bruising Side effects that usually do not require medical attention (report to your doctor or health care professional if they continue or are bothersome): -muscle pain -stomach upset, gas Where should I keep my medicine? This medicine is only  given in a clinic, doctor's office, or other health care setting and will not be stored at home.  2017 Elsevier/Gold Standard (2015-01-28 10:06:55)  

## 2016-03-07 MED FILL — IBRANCE 75 MG CAPSULE: 75 | 28 days supply | Qty: 11 | Fill #5

## 2016-03-10 ENCOUNTER — Other Ambulatory Visit: Payer: Self-pay | Admitting: Adult Health

## 2016-03-10 DIAGNOSIS — C7931 Secondary malignant neoplasm of brain: Principal | ICD-10-CM

## 2016-03-10 DIAGNOSIS — C50919 Malignant neoplasm of unspecified site of unspecified female breast: Secondary | ICD-10-CM

## 2016-03-13 ENCOUNTER — Ambulatory Visit (HOSPITAL_BASED_OUTPATIENT_CLINIC_OR_DEPARTMENT_OTHER): Payer: BC Managed Care – PPO | Admitting: Oncology

## 2016-03-13 ENCOUNTER — Ambulatory Visit (HOSPITAL_BASED_OUTPATIENT_CLINIC_OR_DEPARTMENT_OTHER): Payer: BC Managed Care – PPO

## 2016-03-13 ENCOUNTER — Other Ambulatory Visit (HOSPITAL_BASED_OUTPATIENT_CLINIC_OR_DEPARTMENT_OTHER): Payer: BC Managed Care – PPO

## 2016-03-13 VITALS — BP 113/60 | HR 103 | Temp 98.4°F | Resp 18 | Ht 68.0 in | Wt 129.6 lb

## 2016-03-13 DIAGNOSIS — C7931 Secondary malignant neoplasm of brain: Secondary | ICD-10-CM | POA: Diagnosis not present

## 2016-03-13 DIAGNOSIS — C773 Secondary and unspecified malignant neoplasm of axilla and upper limb lymph nodes: Secondary | ICD-10-CM

## 2016-03-13 DIAGNOSIS — C7951 Secondary malignant neoplasm of bone: Secondary | ICD-10-CM | POA: Diagnosis not present

## 2016-03-13 DIAGNOSIS — Z170 Estrogen receptor positive status [ER+]: Secondary | ICD-10-CM

## 2016-03-13 DIAGNOSIS — C50411 Malignant neoplasm of upper-outer quadrant of right female breast: Secondary | ICD-10-CM | POA: Diagnosis not present

## 2016-03-13 DIAGNOSIS — C50911 Malignant neoplasm of unspecified site of right female breast: Secondary | ICD-10-CM

## 2016-03-13 DIAGNOSIS — Z5111 Encounter for antineoplastic chemotherapy: Secondary | ICD-10-CM

## 2016-03-13 DIAGNOSIS — C50919 Malignant neoplasm of unspecified site of unspecified female breast: Secondary | ICD-10-CM

## 2016-03-13 LAB — COMPREHENSIVE METABOLIC PANEL
ALT: 13 U/L (ref 0–55)
AST: 18 U/L (ref 5–34)
Albumin: 4 g/dL (ref 3.5–5.0)
Alkaline Phosphatase: 107 U/L (ref 40–150)
Anion Gap: 10 mEq/L (ref 3–11)
BUN: 15.9 mg/dL (ref 7.0–26.0)
CALCIUM: 9.8 mg/dL (ref 8.4–10.4)
CHLORIDE: 106 meq/L (ref 98–109)
CO2: 29 meq/L (ref 22–29)
CREATININE: 0.8 mg/dL (ref 0.6–1.1)
EGFR: >90 mL/min/{1.73_m2} (ref >90)
Glucose: 91 mg/dl (ref 70–140)
Potassium: 3.6 mEq/L (ref 3.5–5.1)
Sodium: 144 mEq/L (ref 136–145)
Total Bilirubin: 0.24 mg/dL (ref 0.20–1.20)
Total Protein: 7.9 g/dL (ref 6.4–8.3)

## 2016-03-13 LAB — CBC WITH DIFFERENTIAL/PLATELET
BASO%: 0.6 % (ref 0.0–2.0)
Basophils Absolute: 0 10*3/uL (ref 0.0–0.1)
EOS%: 1.6 % (ref 0.0–7.0)
Eosinophils Absolute: 0.1 10*3/uL (ref 0.0–0.5)
HEMATOCRIT: 34.8 % (ref 34.8–46.6)
HGB: 11.8 g/dL (ref 11.6–15.9)
LYMPH#: 1.1 10*3/uL (ref 0.9–3.3)
LYMPH%: 28.3 % (ref 14.0–49.7)
MCH: 31.7 pg (ref 25.1–34.0)
MCHC: 33.9 g/dL (ref 31.5–36.0)
MCV: 93.3 fL (ref 79.5–101.0)
MONO#: 0.3 10*3/uL (ref 0.1–0.9)
MONO%: 7.5 % (ref 0.0–14.0)
NEUT#: 2.4 10*3/uL (ref 1.5–6.5)
NEUT%: 62 % (ref 38.4–76.8)
Platelets: 232 10*3/uL (ref 145–400)
RBC: 3.72 10*6/uL (ref 3.70–5.45)
RDW: 13.2 % (ref 11.2–14.5)
WBC: 4 10*3/uL (ref 3.9–10.3)

## 2016-03-13 MED ORDER — DENOSUMAB 120 MG/1.7ML ~~LOC~~ SOLN
120.0000 mg | Freq: Once | SUBCUTANEOUS | Status: AC
Start: 2016-03-13 — End: 2016-03-13
  Administered 2016-03-13: 120 mg via SUBCUTANEOUS
  Filled 2016-03-13: qty 1.7

## 2016-03-13 MED ORDER — FULVESTRANT 250 MG/5ML IM SOLN
500.0000 mg | Freq: Once | INTRAMUSCULAR | Status: AC
  Administered 2016-03-13: 500 mg via INTRAMUSCULAR
  Filled 2016-03-13: qty 10

## 2016-03-13 NOTE — Progress Notes (Signed)
[facility]  [phone] [phone]     ID: Carolyn [name] DOB: Oct 04, 1970  MR#: [id number]  TML#:[id number]  Patient Care Team: [name], MD as PCP - General (Family Medicine) Chauncey Cruel, MD as Consulting Physician (Oncology) Kyung Rudd, MD as Consulting Physician (Radiation Oncology) Kathie Rhodes, DMD as Physician Assistant (Dentistry) Burnell Blanks. 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 The interval history is generally unremarkable. She is tolerating the Xgeva well. This shots causes her a little bit of itching at the site for a couple of days but no bony aches or pains. She is also tolerating Ibrance well. She has some fatigue but still works full time. She has no nausea with this. She has no side effects from the fulvestrant that she is aware   REVIEW OF SYSTEMS: Carolyn [name] denies any unusual headaches, visual changes, stiff neck, or problems with gait other than the fact that of course she does have to use a cane because her right leg always feels heavy. She is having moderate hot flashes. A detailed review of systems today was otherwise noncontributory  BREAST CANCER HISTORY: From doctor Khan's of original intake node 05/31/2011:  "Carolyn [name] is a 46 y.o. female. Without significant past medical history. She underwent a mammogram that showed calcifications measuring 5 cm on the right breast. She then went on To have an ultrasound which showed the area to measure 2.9 cm. She was also found to have a right axillary lymph node that was suspicious for a malignancy. She had a biopsy of the calcifications that  showed high-grade ductal carcinoma in situ. 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 In the lymph node biopsy there was no lymphatic tissue seen and was felt that the node was replaced by tumor. Patient went on to have an MRI of the bilateral breasts performed on evening of 05/30/2011. The MRI showed in the right breast 5 x 3 x 4.5 cm mass abutting the chest wall. About 7 cm away from this there was another mass in the upper inner quadrant that measured 1.5 x 1.7 x 1.5 cm. On the contralateral breast, that is the left breast a 2 cm area suspicious enhancement was noted also this up to date has not been biopsied and arrangements are being made for the biopsy to be performed. In this side there were no suspicious lymph nodes. The prognostic panel is pending. Patient is otherwise without any complaints. "  METASTATIC DISEASE: From the earlier summary note:  Carolyn [name] noted some strange feelings around her umbilicus 68/12/7515. This felt like an area of numbness. Over the next 2 days she noted some leg weakness and difficulty walking, so she presented to the ED 04/12/2014. MRI of the thoraco-lumbar spine was obtained 04/12/2014 showing multiple bone lesions and compression fracture at T8 with retropulsion and cord compression. On 04/13/2014 she underwent Laminectomy T8 decompression of spinal cord posterior fixation from T6-T10 with pedicle screws and rods posterior arthrodesis with allograft. The pathology from this procedure (SZA [phone]) showed metastatic adenocarcinoma which was estrogen receptor 69% positive, with moderate staining intensity, progesterone receptor negative. HER-2 could not be obtained.  The MRi review suggested possible brain  involvement and on 04/14/2014 she had a brain MRI which showed a 0.7 cm lesion in the L centrum semiovale. There were nonspecific L temporal bone changes and also possible involvement of the clivus and calvarium, but no other  parenchymal brain lesions. Further staging studies included a bone scan, which failed to show the lytic lesion seen on other scans, and CTs of the chest, abdomen and pelvis on 06/08/2011, which showed very small right lung and left liver lesions which will require follow-up  Her subsequent history is as detailed below   PAST MEDICAL HISTORY: Past Medical History:  Diagnosis Date  . Breast cancer ([place]) 09/2011   invasive ductal carcinoma metastatic ca in 3/14 lymph nodes  . History of chemotherapy 06/27/11 -09/04/11   neoadjuvant  . Hx of radiation therapy 11/08/11 -12/26/11   right chest wall/supraclav fossa, right scar  . S/P radiation therapy 05/08/14   SRS brain    PAST SURGICAL HISTORY: Past Surgical History:  Procedure Laterality Date  . brain surgery-LITT Left 11/20/14   LITT procedure for Lt brain met.  Marland Kitchen BREAST BIOPSY     Left  . CHOLECYSTECTOMY N/A 03/01/2014   Procedure: LAPAROSCOPIC CHOLECYSTECTOMY;  Surgeon: Leighton Ruff, MD;  Location: [facility];  Service: General;  Laterality: N/A;  . LAMINECTOMY N/A 04/13/2014   Procedure: THORACIC LAMINECTOMY WITH FIXATION THORACIC SIX-THORACIC TEN FUSION;  Surgeon: Kristeen Miss, MD;  Location: [facility];  Service: Neurosurgery;  Laterality: N/A;  . MODIFIED MASTECTOMY  10/03/2011   Procedure: MODIFIED MASTECTOMY;  Surgeon: Haywood Lasso, MD;  Location: [facility];  Service: General;  Laterality: Right;  . PORT-A-CATH REMOVAL  01/30/2012   Procedure: MINOR REMOVAL PORT-A-CATH;  Surgeon: Haywood Lasso, MD;  Location: [facility];  Service: General;  Laterality: Left;  . PORTACATH PLACEMENT  06/20/2011   Procedure: INSERTION PORT-A-CATH;  Surgeon: Haywood Lasso, MD;  Location: [facility];  Service: General;  Laterality: N/A;  . ROBOTIC ASSISTED TOTAL HYSTERECTOMY WITH BILATERAL SALPINGO OOPHERECTOMY Bilateral 12/23/2012   Procedure: ROBOTIC ASSISTED TOTAL HYSTERECTOMY WITH BILATERAL SALPINGO  OOPHORECTOMY;  Surgeon: Marvene Staff, MD;  Location: [facility];  Service: Gynecology;  Laterality: Bilateral;  . WISDOM TOOTH EXTRACTION      FAMILY HISTORY Family History  Problem Relation Age of Onset  . Breast cancer Maternal Aunt 4  . Pancreatic cancer Maternal Grandfather   . Pancreatic cancer Maternal Aunt     died in her 76s  . Leukemia Maternal Aunt     died in her 29s  . Ovarian cancer Cousin 65    maternal cousin   the patient's father is alive, currently 84 years old. The patient's mother died from complications of diabetes at the age of 52. The patient had no brothers, 4 sisters. One sister has died from congestive heart failure. The patient's mother is one of 5 sisters. One of them was diagnosed with breast cancer the age of 57. Also a cousin on the maternal side it was diagnosed with ovarian cancer. This was approximately age 67. There is also colon cancer in the family. The patient has had extensive genetic testing summarized below. She is however BRCA negative  GYNECOLOGIC HISTORY:  Patient's last menstrual period was 07/05/2011. Menarche age 11, she is GX P0. She underwent total hysterectomy with bilateral salpingo-oophorectomy December 2014.  SOCIAL HISTORY:  Carolyn [name] works at [employer] for [employer]. She mostly deals with government documents. She is single, lives by herself, with no pets. She  attends [employer] locally.    ADVANCED DIRECTIVES: Not in place. The patient has a documents and intends to name her sister Carolyn [name] as healthcare power of attorney. Carolyn [name] lives in [place] and can be reached at [place]: Social History  Substance Use Topics  . Smoking status: Never Smoker  . Smokeless tobacco: Never Used  . Alcohol use No     Colonoscopy:  PAP:  Bone density:  Lipid panel:  Allergies  Allergen Reactions  . Allegra [Fexofenadine] Hives    Abdomen only  . Shellfish Allergy Hives    Abdomen only    Current  Outpatient Prescriptions  Medication Sig Dispense Refill  . acetaminophen (TYLENOL) 325 MG tablet Take 2 tablets (650 mg total) by mouth every 6 (six) hours as needed for mild pain, moderate pain, fever or headache.    . Calcium Carbonate-Vit D-Min (CALCIUM 1200 PO) Take 1 tablet by mouth 2 (two) times daily.    . Cholecalciferol (VITAMIN D3) 2000 UNITS TABS Take 2,000 Units by mouth daily.    . Multiple Vitamin (MULTIVITAMIN) tablet Take 1 tablet by mouth daily.    . palbociclib (IBRANCE) 75 MG capsule TAKE 1 CAPSULE BY MOUTH every other day for 21 days (total 11 capsules) then off a week 11 capsule 6   No current facility-administered medications for this visit.     OBJECTIVE: Middle-aged African American woman   Vitals:   03/13/16 0917  BP: 113/60  Pulse: (!) 103  Resp: 18  Temp: 98.4 F (36.9 C)     Body mass index is 19.71 kg/m.    ECOG FS:1 - Symptomatic but completely ambulatory   Sclerae unicteric, pupils round and equal Oropharynx clear and moist-- no thrush or other lesions No cervical or supraclavicular adenopathy Lungs no rales or rhonchi Heart regular rate and rhythm Abd soft, nontender, positive bowel sounds MSK no focal spinal tenderness, no upper extremity lymphedema Neuro: nonfocal, well oriented, appropriate affect Breasts: The right breast is status post mastectomy. There is no evidence of chest wall recurrence. The left breast is unremarkable. Both axillae are benign.  LAB RESULTS:  CMP     Component Value Date/Time   NA 143 02/14/2016 0904   K 4.1 02/14/2016 0904   CL 103 04/21/2014 0910   CL 102 01/16/2012 0804   CO2 28 02/14/2016 0904   GLUCOSE 118 02/14/2016 0904   GLUCOSE 92 01/16/2012 0804   BUN 10.8 02/14/2016 0904   CREATININE 0.9 02/14/2016 0904   CALCIUM 9.9 02/14/2016 0904   PROT 7.6 02/14/2016 0904   ALBUMIN 4.2 02/14/2016 0904   AST 22 02/14/2016 0904   ALT 16 02/14/2016 0904   ALKPHOS 145 02/14/2016 0904   BILITOT 0.31 02/14/2016  0904   GFRNONAA >90 04/21/2014 0910   GFRAA >90 04/21/2014 0910    I No results found for: SPEP  Lab Results  Component Value Date   WBC 4.0 03/13/2016   NEUTROABS 2.4 03/13/2016   HGB 11.8 03/13/2016   HCT 34.8 03/13/2016   MCV 93.3 03/13/2016   PLT 232 03/13/2016      Chemistry      Component Value Date/Time   NA 143 02/14/2016 0904   K 4.1 02/14/2016 0904   CL 103 04/21/2014 0910   CL 102 01/16/2012 0804   CO2 28 02/14/2016 0904   BUN 10.8 02/14/2016 0904   CREATININE 0.9 02/14/2016 0904      Component Value Date/Time   CALCIUM 9.9  02/14/2016 0904   ALKPHOS 145 02/14/2016 0904   AST 22 02/14/2016 0904   ALT 16 02/14/2016 0904   BILITOT 0.31 02/14/2016 0904       Lab Results  Component Value Date   LABCA2 24 05/31/2011    No components found for: DDUKG254  No results for input(s): INR in the last 168 hours.  Urinalysis No results found for: COLORURINE  STUDIES: No results found.  ASSESSMENT: 46 y.o. BRCA negative  woman status post right breast upper outer quadrant and right axillary lymph node biopsy 05/18/2011, both positive for an invasive ductal carcinoma, high-grade, clinicallyT2 N1-2 or stage IIB/IIIA,  estrogen receptor 100% positive, progesterone receptor 87% positive, with an MIB-1 of 14% and no HER-2 amplification ([id number]).  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   (a) VUS were also found in [place] and BARD1  (2) additional right breast biopsy upper inner quadrant 06/08/2011 showed only a fibroadenoma, and central left breast biopsy for another suspicious lesion showed only fibrocystic changes ([id number] and [id number]).   (3)Treated neoadjuvantly with cyclophosphamide and docetaxel x4 completed 08/29/2011.  (4) status post right modified radical mastectomy 10/03/2011 showing a residual pT1c pN1a (3/18 lymph nodes positive) invasive ductal carcinoma, grade 1,estrogen  receptor 100% positive, progesterone receptor negative, with no HER-2 amplification ([id number])  (5) adjuvant radiation to the right chest wall, right supraclavicular fossa and right scar completed 12/26/2011  (6) tamoxifen started January 2014-- discontinued April 2016 with evidence of metastatic disease  (7) status post total hysterectomy with bilateral salpingo-oophorectomy 12/23/2012 with benign pathology ([id number])  METASTATIC DISEASE 04/13/2014 (8) presented with T8 cord compression and underwent laminectomy 04/13/2014 with T8 decompression of spinal cord, posterior fixation from T6-T10 with pedicle screws and rods, posterior arthrodesis with allograft. 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 ExacTrac Snap verification was performed for each couch angle.   (b) radiation to the T-spine completed 05/08/2014.  (c) "Auto-LITT" procedure performed at [facility] 11/20/14  (d) SRS to 0.4 cm left parietal lesion 12/23/2015  (11) started fulvestrant 05/18/2014 and Palbociclib 06/01/2014  (a) Palbociclib dose decreased to 100 mg daily, 21/7 as of 08/05/2014  (b) palbociclib dose decreased to 75 mg daily, 21/7, starting May 2017  (c) palbociclib dose decreased to 75 mg every other day beginning 07/26/2015    (d) palbociclib held for month of October because of persistent low counts, resumed November  (12) started zolendronate 05/18/2014, repeated every 12 weeks  (a) July zolendronate dose held because of dental  issues, resumed 10/18/2015  (b) switched to denosumab as of 01/20/2016 with evidence of bony progression on zolendronate  PLAN:  WeKaidenceontinues to have a remarkably good functional status given her metastatic disease.  Currently there is no evidence of central nervous system progression. She is already scheduled for a repeat brain MRI the last Monday of this month with follow-up radiation oncology.  Her ANTrinityontinues to be excellent, and I am making no changes in her palbociclib dose. She is starting the cycle  today.   I am scheduling her for a bone scan and CT scan of the chest last Thursday of this month and she will see me 04/10/2016, with her next dose of fulvestrant and denosumab. If there is evidence of progression we will consider switching to exemestane/everolimus  She knows to call for any problems that may develop before her next visit here, which will be in 4 weeks.     Chauncey Cruel, MD   03/13/2016 9:28 AM

## 2016-03-13 NOTE — Patient Instructions (Signed)
Fulvestrant injection What is this medicine? FULVESTRANT (ful VES trant) blocks the effects of estrogen. It is used to treat breast cancer. This medicine may be used for other purposes; ask your health care provider or pharmacist if you have questions. COMMON BRAND NAME(S): FASLODEX What should I tell my health care provider before I take this medicine? They need to know if you have any of these conditions: -bleeding problems -liver disease -low levels of platelets in the blood -an unusual or allergic reaction to fulvestrant, other medicines, foods, dyes, or preservatives -pregnant or trying to get pregnant -breast-feeding How should I use this medicine? This medicine is for injection into a muscle. It is usually given by a health care professional in a hospital or clinic setting. Talk to your pediatrician regarding the use of this medicine in children. Special care may be needed. Overdosage: If you think you have taken too much of this medicine contact a poison control center or emergency room at once. NOTE: This medicine is only for you. Do not share this medicine with others. What if I miss a dose? It is important not to miss your dose. Call your doctor or health care professional if you are unable to keep an appointment. What may interact with this medicine? -medicines that treat or prevent blood clots like warfarin, enoxaparin, and dalteparin This list may not describe all possible interactions. Give your health care provider a list of all the medicines, herbs, non-prescription drugs, or dietary supplements you use. Also tell them if you smoke, drink alcohol, or use illegal drugs. Some items may interact with your medicine. What should I watch for while using this medicine? Your condition will be monitored carefully while you are receiving this medicine. You will need important blood work done while you are taking this medicine. Do not become pregnant while taking this medicine or for  at least 1 year after stopping it. Women of child-bearing potential will need to have a negative pregnancy test before starting this medicine. Women should inform their doctor if they wish to become pregnant or think they might be pregnant. There is a potential for serious side effects to an unborn child. Men should inform their doctors if they wish to father a child. This medicine may lower sperm counts. Talk to your health care professional or pharmacist for more information. Do not breast-feed an infant while taking this medicine or for 1 year after the last dose. What side effects may I notice from receiving this medicine? Side effects that you should report to your doctor or health care professional as soon as possible: -allergic reactions like skin rash, itching or hives, swelling of the face, lips, or tongue -feeling faint or lightheaded, falls -pain, tingling, numbness, or weakness in the legs -signs and symptoms of infection like fever or chills; cough; flu-like symptoms; sore throat -vaginal bleeding Side effects that usually do not require medical attention (report to your doctor or health care professional if they continue or are bothersome): -aches, pains -constipation -diarrhea -headache -hot flashes -nausea, vomiting -pain at site where injected -stomach pain This list may not describe all possible side effects. Call your doctor for medical advice about side effects. You may report side effects to FDA at 1-800-FDA-1088. Where should I keep my medicine? This drug is given in a hospital or clinic and will not be stored at home. NOTE: This sheet is a summary. It may not cover all possible information. If you have questions about this medicine, talk to your   doctor, pharmacist, or health care provider.  2017 Elsevier/Gold Standard (2014-07-24 11:03:55) Denosumab injection What is this medicine? DENOSUMAB (den oh sue mab) slows bone breakdown. Prolia is used to treat osteoporosis in  women after menopause and in men. Xgeva is used to prevent bone fractures and other bone problems caused by cancer bone metastases. Xgeva is also used to treat giant cell tumor of the bone. COMMON BRAND NAME(S): Prolia, XGEVA What should I tell my health care provider before I take this medicine? They need to know if you have any of these conditions: -dental disease -eczema -infection or history of infections -kidney disease or on dialysis -low blood calcium or vitamin D -malabsorption syndrome -scheduled to have surgery or tooth extraction -taking medicine that contains denosumab -thyroid or parathyroid disease -an unusual reaction to denosumab, other medicines, foods, dyes, or preservatives -pregnant or trying to get pregnant -breast-feeding How should I use this medicine? This medicine is for injection under the skin. It is given by a health care professional in a hospital or clinic setting. If you are getting Prolia, a special MedGuide will be given to you by the pharmacist with each prescription and refill. Be sure to read this information carefully each time. For Prolia, talk to your pediatrician regarding the use of this medicine in children. Special care may be needed. For Xgeva, talk to your pediatrician regarding the use of this medicine in children. While this drug may be prescribed for children as young as 13 years for selected conditions, precautions do apply. What if I miss a dose? It is important not to miss your dose. Call your doctor or health care professional if you are unable to keep an appointment. What may interact with this medicine? Do not take this medicine with any of the following medications: -other medicines containing denosumab This medicine may also interact with the following medications: -medicines that suppress the immune system -medicines that treat cancer -steroid medicines like prednisone or cortisone What should I watch for while using this  medicine? Visit your doctor or health care professional for regular checks on your progress. Your doctor or health care professional may order blood tests and other tests to see how you are doing. Call your doctor or health care professional if you get a cold or other infection while receiving this medicine. Do not treat yourself. This medicine may decrease your body's ability to fight infection. You should make sure you get enough calcium and vitamin D while you are taking this medicine, unless your doctor tells you not to. Discuss the foods you eat and the vitamins you take with your health care professional. See your dentist regularly. Brush and floss your teeth as directed. Before you have any dental work done, tell your dentist you are receiving this medicine. Do not become pregnant while taking this medicine or for 5 months after stopping it. Women should inform their doctor if they wish to become pregnant or think they might be pregnant. There is a potential for serious side effects to an unborn child. Talk to your health care professional or pharmacist for more information. What side effects may I notice from receiving this medicine? Side effects that you should report to your doctor or health care professional as soon as possible: -allergic reactions like skin rash, itching or hives, swelling of the face, lips, or tongue -breathing problems -chest pain -fast, irregular heartbeat -feeling faint or lightheaded, falls -fever, chills, or any other sign of infection -muscle spasms, tightening, or twitches -numbness   or tingling -skin blisters or bumps, or is dry, peels, or red -slow healing or unexplained pain in the mouth or jaw -unusual bleeding or bruising Side effects that usually do not require medical attention (report to your doctor or health care professional if they continue or are bothersome): -muscle pain -stomach upset, gas Where should I keep my medicine? This medicine is only  given in a clinic, doctor's office, or other health care setting and will not be stored at home.  2017 Elsevier/Gold Standard (2015-01-28 10:06:55)  

## 2016-03-22 NOTE — Progress Notes (Signed)
Carolyn [name] 46 y.o. 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   Headache:No Pain:5/10 Left hip taking Tylenol Dizziness:Yes medication related Nausea/Vomiting/Ataxia:None Visional changes(Blurred/Diplopia (double vision), blind spots and peripheral vision changes):None wears glasses Ring in ears:None Fatigue:Yes most of the day when she is working. Cognitive changes:Reports no cognitive changes.  Answered all questions quickly. Weight: Wt Readings from Last 3 Encounters:  04/05/16 130 lb (59 kg)  03/13/16 129 lb 9.6 oz (58.8 kg)  01/31/16 125 lb 9.6 oz (57 kg)   Appetite: Good eating three meals per day with snacks. Imaging:04-03-16 Lab: 03-13-16 CBC w diff, Cmet 03-13-16 Saw DR. Magrinat chemotherapy fulvestrant, palbociclib, denosumab/Xgeva BP 119/70   Pulse 88   Temp 98.1 F (36.7 C) (Oral)   Resp 16   Ht '5\' 8"'  (1.727 m)   Wt 130 lb (59 kg)   LMP 07/05/2011   SpO2 100%   BMI 19.77 kg/m

## 2016-04-03 ENCOUNTER — Ambulatory Visit
Admission: RE | Admit: 2016-04-03 | Discharge: 2016-04-03 | Disposition: A | Discharge status: Home / Self Care | Payer: BC Managed Care – PPO | Source: Ambulatory Visit | Attending: Radiation Oncology | Admitting: Radiation Oncology

## 2016-04-03 DIAGNOSIS — C7931 Secondary malignant neoplasm of brain: Secondary | ICD-10-CM

## 2016-04-03 DIAGNOSIS — C7949 Secondary malignant neoplasm of other parts of nervous system: Principal | ICD-10-CM

## 2016-04-03 MED ORDER — GADOBENATE DIMEGLUMINE 529 MG/ML IV SOLN
10.0000 mL | Freq: Once | INTRAVENOUS | Status: AC | PRN
  Administered 2016-04-03: 10 mL via INTRAVENOUS

## 2016-04-05 ENCOUNTER — Telehealth: Payer: Self-pay

## 2016-04-05 ENCOUNTER — Encounter: Payer: Self-pay | Admitting: Radiation Oncology

## 2016-04-05 ENCOUNTER — Ambulatory Visit
Admission: RE | Admit: 2016-04-05 | Discharge: 2016-04-05 | Disposition: A | Discharge status: Home / Self Care | Payer: BC Managed Care – PPO | Source: Ambulatory Visit | Attending: Radiation Oncology | Admitting: Radiation Oncology

## 2016-04-05 VITALS — BP 119/70 | HR 88 | Temp 98.1°F | Resp 16 | Ht 68.0 in | Wt 130.0 lb

## 2016-04-05 DIAGNOSIS — C50919 Malignant neoplasm of unspecified site of unspecified female breast: Secondary | ICD-10-CM

## 2016-04-05 DIAGNOSIS — Z91013 Allergy to seafood: Secondary | ICD-10-CM | POA: Insufficient documentation

## 2016-04-05 DIAGNOSIS — C7931 Secondary malignant neoplasm of brain: Secondary | ICD-10-CM | POA: Diagnosis present

## 2016-04-05 DIAGNOSIS — Z79899 Other long term (current) drug therapy: Secondary | ICD-10-CM | POA: Diagnosis not present

## 2016-04-05 DIAGNOSIS — Z170 Estrogen receptor positive status [ER+]: Secondary | ICD-10-CM | POA: Diagnosis not present

## 2016-04-05 DIAGNOSIS — C50411 Malignant neoplasm of upper-outer quadrant of right female breast: Secondary | ICD-10-CM

## 2016-04-05 DIAGNOSIS — Z888 Allergy status to other drugs, medicaments and biological substances status: Secondary | ICD-10-CM | POA: Diagnosis not present

## 2016-04-05 DIAGNOSIS — C50911 Malignant neoplasm of unspecified site of right female breast: Secondary | ICD-10-CM | POA: Insufficient documentation

## 2016-04-05 NOTE — Telephone Encounter (Signed)
Pt called stating she is having pain in multiple sites after xgeva in arm and faslodex in L buttock. She mentioned L buttock muscle pain, L leg pain, L front low abdomen by hip bone, L knee joint pain. NOT in low back Pains are 5-6/10, fine with walking around, pain is when sitting on L buttock, no pain when laying down.Started about a week ago and getting worse. Faslodex was 03/13/16.  Achey pain "like arthritis" Tylenol brought it down some but only lasts a couple hours.  This RN suggested she try heat or ice on buttock. What does Dr [name] suggest?

## 2016-04-05 NOTE — Addendum Note (Signed)
Encounter addended by: Malena Edman, RN on: 04/05/2016  4:16 PM<BR>    Actions taken: Charge Capture section accepted

## 2016-04-05 NOTE — Progress Notes (Signed)
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  04/29/2014 SRS Treatment:  PTV1 Left parietal lesion to a dose of 20 gray in 1 fraction  04/27/14-05/08/14:  Palliative radiotherapy to the thoracic spine 30 Gy in 10 fractions  11/08/2011-12/26/2011: Right chest wall / 50.4 Gray @ 1.8 [place] Cables per fraction x 28 fractions Right Supraclavicular fossa / 45 Gray '@1' .8 Gray per fraction x 25 fractions Right scar / 10 Gray at [employer] per fraction x 5 fractions  Narrative:  The patient returns today for routine follow-up. This is a young patient who was diagnosed with metastatic right breast cancer to the brain who underwent stereotactic radiosurgery to the brain lesion in 2016. She continued to have edema and subsequently was referred to Dr. Salomon Fick at [facility] She underwent LITT on 11/20/14. She has been followed closely since. She saw Dr. Salomon Fick 09/24/15 at [facility], and requests that he be copied on her most recent MRI on 12/09/15. This study didn't reveal any new metastatic deposit in the right parietal region measuring 4 mm. No other evidence of the disease is present, and her previously treated lesion in the left parietal region is stable. She proceeded with another course of SRS in December. She does have additional disease in the thoracic spine but has not required radiotherapy to this site since 2016. She comes today to review her recent MRI of the brain on 04/03/16. Although the read on this was concerning for possible interval change  in the right parietal region, upon review with our brain oncology conference, neuroradiology felt that this was representative of post radiotherapy change rather than progressive disease. Recommendations were to repeat imaging in 3 months time. She is currently receiving Fulvestrand and denosumab under the care of Dr. [name], and is due for body imaging and bone scan next week.  On review of systems, the patient reports that she's is doing well overall. She denies any chest pain, shortness of breath, cough, fevers, chills, night sweats, unintended weight changes. She denies any bowel or bladder disturbances, and denies abdominal pain, nausea or vomiting. She is not having any headaches, visual or auditory disturbances. She denies any dizziness. denies any new musculoskeletal or joint aches or pains, new skin lesions or concerns. A complete review of systems is obtained and is otherwise negative.                             ALLERGIES:  is allergic to allegra [fexofenadine] and shellfish allergy.  Meds: Current Outpatient Prescriptions  Medication Sig Dispense Refill  . acetaminophen (TYLENOL) 325 MG tablet Take 2 tablets (650 mg total) by mouth every 6 (six) hours as needed for mild pain, moderate pain, fever or headache.    . Calcium Carbonate-Vit D-Min (CALCIUM 1200 PO) Take 1 tablet by mouth 2 (two) times daily.    . Cholecalciferol (VITAMIN D3) 2000 UNITS TABS Take 2,000 Units by mouth daily.    . Multiple Vitamin (MULTIVITAMIN) tablet Take 1  tablet by mouth daily.    . palbociclib (IBRANCE) 75 MG capsule TAKE 1 CAPSULE BY MOUTH every other day for 21 days (total 11 capsules) then off a week 11 capsule 6   No current facility-administered medications for this encounter.     Physical Findings:  height is '5\' 8"'  (1.727 m) and weight is 130 lb (59 kg). Her oral temperature is 98.1 F (36.7 C). Her blood pressure is 119/70 and her pulse is 88. Her respiration is 16 and oxygen saturation is 100%.    Pain Assessment Pain Score: 5  (Left hip)/10  In general this is a well appearing African-American female in no acute distress. She's alert and oriented x4 and appropriate throughout the examination. Cardiopulmonary assessment is negative for acute distress and she exhibits normal effort. She appears to be neurologically intact grossly without focal deficits.    Lab Findings: Lab Results  Component Value Date   WBC 4.0 03/13/2016   HGB 11.8 03/13/2016   HCT 34.8 03/13/2016   MCV 93.3 03/13/2016   PLT 232 03/13/2016     Radiographic Findings: Mr Jeri Cos YS Contrast  Result Date: 04/03/2016 CLINICAL DATA:  Follow-up of treated brain metastases. SRS to Lt Post Centrum [place]ovale to 20 Gy given 04/29/2014. SRS RIGHT parietal target 12/23/2015, 20 Gy. EXAM: MRI HEAD WITHOUT AND WITH CONTRAST TECHNIQUE: Multiplanar, multiecho pulse sequences of the brain and surrounding structures were obtained without and with intravenous contrast. CONTRAST:  23m MULTIHANCE GADOBENATE DIMEGLUMINE 529 MG/ML IV SOLN COMPARISON:  Multiple priors, most recent 12/09/2015. FINDINGS: Brain: The recently treated RIGHT parietal metastasis is worse. The lesion is increased in size, now 6 mm compared to 4 mm previously. Tiny amount of central necrosis. Reference image 90 series 10. In addition, there is a moderate amount of vasogenic edema surrounding the lesion, not present previously. No midline shift. The lesion is not hemorrhagic nor is there significant restriction. No new lesions are present. Unchanged previously treated LEFT periatrial region periventricular LEFT parietal metastasis, chronic blood products both within the lesion and along the biopsy tract, stable 11 x 13 mm cross-section. Stable cerebral volume. No significant white matter disease. Vascular: Normal flow voids. Skull and upper cervical spine: No calvarial lesions. LEFT temporal bone lesion stable, 15 x 12 mm cross-section, image 47 series 10.  Sinuses/Orbits: Negative. Other: None. IMPRESSION: Interval increase in size, persistent enhancement, and new vasogenic edema associated with the treated RIGHT parietal metastasis. Differential considerations are post treatment effect versus progression of metastatic disease. There was similar progression of the previously treated LEFT periatrial lesion in 2016 [facility] Continued short-term surveillance warranted. Electronically Signed   By: JStaci RighterM.D.   On: 04/03/2016 15:49    Impression/Plan: 1. 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 We reviewed the imaging and report from her MRI results and discussed the recommendations from today's conference. We will proceed with repeat imaging in 3 months time, and she is encouraged to contact [facility] if she has questions or concerns prior to that visit. She will continue systemic therapy with Dr. MJana Hakim and we will be happy to discuss additional treatment to the thoracic spine if he feels this is necessary in the future.   In a visit lasting 20 minutes, greater than 50% of the time was spent face to face discussing her MRI imaging, and coordinating the patient's care.    ACarola Rhine PAC

## 2016-04-06 ENCOUNTER — Ambulatory Visit (HOSPITAL_COMMUNITY)
Admission: RE | Admit: 2016-04-06 | Discharge: 2016-04-06 | Disposition: A | Discharge status: Home / Self Care | Payer: BC Managed Care – PPO | Source: Ambulatory Visit | Attending: Oncology | Admitting: Oncology

## 2016-04-06 ENCOUNTER — Ambulatory Visit (HOSPITAL_COMMUNITY): Payer: BC Managed Care – PPO

## 2016-04-06 ENCOUNTER — Encounter (HOSPITAL_COMMUNITY)
Admission: RE | Admit: 2016-04-06 | Discharge: 2016-04-06 | Disposition: A | Discharge status: Home / Self Care | Payer: BC Managed Care – PPO | Source: Ambulatory Visit | Attending: Oncology | Admitting: Oncology

## 2016-04-06 DIAGNOSIS — Z170 Estrogen receptor positive status [ER+]: Secondary | ICD-10-CM | POA: Insufficient documentation

## 2016-04-06 DIAGNOSIS — C7931 Secondary malignant neoplasm of brain: Secondary | ICD-10-CM

## 2016-04-06 DIAGNOSIS — C7951 Secondary malignant neoplasm of bone: Secondary | ICD-10-CM | POA: Insufficient documentation

## 2016-04-06 DIAGNOSIS — R918 Other nonspecific abnormal finding of lung field: Secondary | ICD-10-CM | POA: Insufficient documentation

## 2016-04-06 DIAGNOSIS — C50911 Malignant neoplasm of unspecified site of right female breast: Secondary | ICD-10-CM

## 2016-04-06 DIAGNOSIS — C50411 Malignant neoplasm of upper-outer quadrant of right female breast: Secondary | ICD-10-CM | POA: Diagnosis present

## 2016-04-06 MED ORDER — IOPAMIDOL (ISOVUE-300) INJECTION 61%
INTRAVENOUS | Status: AC
  Filled 2016-04-06: qty 75

## 2016-04-06 MED ORDER — TECHNETIUM TC 99M MEDRONATE IV KIT: 25.0000 | PACK | Freq: Once | INTRAVENOUS | Status: DC | PRN

## 2016-04-06 MED ORDER — IOPAMIDOL (ISOVUE-300) INJECTION 61%
75.0000 mL | Freq: Once | INTRAVENOUS | Status: AC | PRN
  Administered 2016-04-06: 75 mL via INTRAVENOUS

## 2016-04-06 MED FILL — IBRANCE 75 MG CAPSULE: 75 | 28 days supply | Qty: 11 | Fill #6

## 2016-04-06 NOTE — Telephone Encounter (Signed)
This RN returned call and obtained identified VM. Message left informing pt use of ice, ibuprophen and tylenol may be of benefit. Discomfort likely secondary to placement of injection in depot form.  This RN's name and return call number given for contact.

## 2016-04-10 ENCOUNTER — Ambulatory Visit (HOSPITAL_BASED_OUTPATIENT_CLINIC_OR_DEPARTMENT_OTHER): Payer: BC Managed Care – PPO

## 2016-04-10 ENCOUNTER — Telehealth: Payer: Self-pay | Admitting: Oncology

## 2016-04-10 ENCOUNTER — Ambulatory Visit (HOSPITAL_BASED_OUTPATIENT_CLINIC_OR_DEPARTMENT_OTHER): Payer: BC Managed Care – PPO | Admitting: Oncology

## 2016-04-10 ENCOUNTER — Other Ambulatory Visit (HOSPITAL_BASED_OUTPATIENT_CLINIC_OR_DEPARTMENT_OTHER): Payer: BC Managed Care – PPO

## 2016-04-10 VITALS — BP 126/65 | HR 88 | Temp 98.0°F | Resp 20 | Ht 68.0 in | Wt 129.0 lb

## 2016-04-10 DIAGNOSIS — Z5111 Encounter for antineoplastic chemotherapy: Secondary | ICD-10-CM | POA: Diagnosis not present

## 2016-04-10 DIAGNOSIS — C7931 Secondary malignant neoplasm of brain: Secondary | ICD-10-CM

## 2016-04-10 DIAGNOSIS — C7951 Secondary malignant neoplasm of bone: Secondary | ICD-10-CM | POA: Diagnosis not present

## 2016-04-10 DIAGNOSIS — C773 Secondary and unspecified malignant neoplasm of axilla and upper limb lymph nodes: Secondary | ICD-10-CM

## 2016-04-10 DIAGNOSIS — C50919 Malignant neoplasm of unspecified site of unspecified female breast: Secondary | ICD-10-CM

## 2016-04-10 DIAGNOSIS — C50411 Malignant neoplasm of upper-outer quadrant of right female breast: Secondary | ICD-10-CM | POA: Diagnosis not present

## 2016-04-10 DIAGNOSIS — Z170 Estrogen receptor positive status [ER+]: Secondary | ICD-10-CM

## 2016-04-10 DIAGNOSIS — C50911 Malignant neoplasm of unspecified site of right female breast: Secondary | ICD-10-CM

## 2016-04-10 LAB — CBC WITH DIFFERENTIAL/PLATELET
BASO%: 0.5 % (ref 0.0–2.0)
BASOS ABS: 0 10*3/uL (ref 0.0–0.1)
EOS%: 1.5 % (ref 0.0–7.0)
Eosinophils Absolute: 0.1 10*3/uL (ref 0.0–0.5)
HEMATOCRIT: 37.4 % (ref 34.8–46.6)
HGB: 12.6 g/dL (ref 11.6–15.9)
LYMPH#: 1.5 10*3/uL (ref 0.9–3.3)
LYMPH%: 33.2 % (ref 14.0–49.7)
MCH: 31.6 pg (ref 25.1–34.0)
MCHC: 33.6 g/dL (ref 31.5–36.0)
MCV: 94 fL (ref 79.5–101.0)
MONO#: 0.5 10*3/uL (ref 0.1–0.9)
MONO%: 11.5 % (ref 0.0–14.0)
NEUT#: 2.4 10*3/uL (ref 1.5–6.5)
NEUT%: 53.3 % (ref 38.4–76.8)
Platelets: 176 10*3/uL (ref 145–400)
RBC: 3.98 10*6/uL (ref 3.70–5.45)
RDW: 14.3 % (ref 11.2–14.5)
WBC: 4.5 10*3/uL (ref 3.9–10.3)

## 2016-04-10 LAB — COMPREHENSIVE METABOLIC PANEL
ALT: 15 U/L (ref 0–55)
ANION GAP: 9 meq/L (ref 3–11)
AST: 21 U/L (ref 5–34)
Albumin: 4.2 g/dL (ref 3.5–5.0)
Alkaline Phosphatase: 108 U/L (ref 40–150)
BUN: 14.8 mg/dL (ref 7.0–26.0)
CALCIUM: 10.2 mg/dL (ref 8.4–10.4)
CHLORIDE: 104 meq/L (ref 98–109)
CO2: 28 mEq/L (ref 22–29)
Creatinine: 0.8 mg/dL (ref 0.6–1.1)
Glucose: 93 mg/dl (ref 70–140)
Potassium: 4.2 mEq/L (ref 3.5–5.1)
Sodium: 141 mEq/L (ref 136–145)
Total Bilirubin: 0.25 mg/dL (ref 0.20–1.20)
Total Protein: 7.9 g/dL (ref 6.4–8.3)

## 2016-04-10 MED ORDER — FULVESTRANT 250 MG/5ML IM SOLN
500.0000 mg | Freq: Once | INTRAMUSCULAR | Status: AC
  Administered 2016-04-10: 500 mg via INTRAMUSCULAR
  Filled 2016-04-10: qty 10

## 2016-04-10 NOTE — Patient Instructions (Signed)

## 2016-04-10 NOTE — Progress Notes (Signed)
[facility]  [phone] [phone]     ID: Carolyn [name] DOB: 09/23/1970  MR#: [id number]  SEG#:[id number]  Patient Care Team: [name], MD as PCP - General (Family Medicine) Chauncey Cruel, MD as Consulting Physician (Oncology) Kyung Rudd, MD as Consulting Physician (Radiation Oncology) Kathie Rhodes, DMD as Physician Assistant (Dentistry) Burnell Blanks. 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 She continues on fulvestrant monthly, which he tolerates well aside from the discomfort of the shots itself. She is also on palbociclib, currently at the lowest documented dose, namely 75 mg every other day. Her counts are holding up well on this dose.  She also receives denosumab/Xgeva every 28 days, but she tells me after each dose her bones ache for 2 or 3 days. This did not happen with the zolendronate. She would like to return to that drug.  Since her last visit here she has been restaged with a brain MRI, CT of the chest and bone scan. The bone scan shows increased activity but no new lesions. The CT of the chest is stable. The MRI of the brain shows questionable growth in a lesion or perhaps treatment effect. We reviewed all these films in detail today  REVIEW OF SYSTEMS: Mardel denies headaches, visual changes, nausea, vomiting, or worsening gait imbalance she uses a cane chronically. There have been no recent falls. She denies any cough, phlegm production or pleurisy. The bony pain she experiences after Xgeva lasts only a couple of days, and is controlled on Aleve. A detailed review of systems  today was otherwise stable  BREAST CANCER HISTORY: From doctor Khan's of original intake node 05/31/2011:  "Carolyn [name] is a 46 y.o. female. Without significant past medical history. She underwent a mammogram that showed calcifications measuring 5 cm on the right breast. She then went on To have an ultrasound which showed the area to measure 2.9 cm. She was also found to have a right axillary lymph node that was suspicious for a malignancy. She had a biopsy of the calcifications that showed high-grade ductal carcinoma in situ. 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 In the lymph node biopsy there was no lymphatic tissue seen and was felt that the node was replaced by tumor. Patient went on to have an MRI of the bilateral breasts performed on evening of 05/30/2011. The MRI showed in the right breast 5 x 3 x 4.5 cm mass abutting the chest wall. About 7 cm away from this there was another mass in the upper inner quadrant that measured 1.5 x 1.7 x 1.5 cm. On the contralateral breast, that is the left breast a 2 cm area suspicious enhancement was noted also this up to date has not been biopsied and arrangements are being made for the biopsy to be performed. In this side there were no suspicious lymph nodes. The prognostic panel is pending. Patient is otherwise without any complaints. "  METASTATIC DISEASE: From the earlier summary note:  Alzina noted some strange feelings around her umbilicus 73/71/0626. This felt like an area of numbness. Over the next 2 days she noted some leg weakness and difficulty walking, so she  presented to the ED 04/12/2014. MRI of the thoraco-lumbar spine was obtained 04/12/2014 showing multiple bone lesions and compression fracture at T8 with retropulsion and cord compression. On 04/13/2014 she underwent Laminectomy T8 decompression of spinal cord posterior fixation from T6-T10 with pedicle screws and rods posterior arthrodesis with  allograft. 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 HER-2 could not be obtained.  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 There were nonspecific L temporal bone changes and also possible involvement of the clivus and calvarium, but no other parenchymal brain lesions. Further staging studies included a bone scan, which failed to show the lytic lesion seen on other scans, and CTs of the chest, abdomen and pelvis on 06/08/2011, which showed very small right lung and left liver lesions which will require follow-up  Her subsequent history is as detailed below   PAST MEDICAL HISTORY: Past Medical History:  Diagnosis Date  . Breast cancer ([place]) 09/2011   invasive ductal carcinoma metastatic ca in 3/14 lymph nodes  . History of chemotherapy 06/27/11 -09/04/11   neoadjuvant  . Hx of radiation therapy 11/08/11 -12/26/11   right chest wall/supraclav fossa, right scar  . S/P radiation therapy 05/08/14   SRS brain    PAST SURGICAL HISTORY: Past Surgical History:  Procedure Laterality Date  . brain surgery-LITT Left 11/20/14   LITT procedure for Lt brain met.  Marland Kitchen BREAST BIOPSY     Left  . CHOLECYSTECTOMY N/A 03/01/2014   Procedure: LAPAROSCOPIC CHOLECYSTECTOMY;  Surgeon: Leighton Ruff, MD;  Location: [facility];  Service: General;  Laterality: N/A;  . LAMINECTOMY N/A 04/13/2014   Procedure: THORACIC LAMINECTOMY WITH FIXATION THORACIC SIX-THORACIC TEN FUSION;  Surgeon: Kristeen Miss, MD;  Location: [facility];  Service: Neurosurgery;  Laterality: N/A;  . MODIFIED MASTECTOMY  10/03/2011   Procedure: MODIFIED MASTECTOMY;  Surgeon: Haywood Lasso, MD;  Location: [facility];  Service: General;  Laterality: Right;  . PORT-A-CATH REMOVAL   01/30/2012   Procedure: MINOR REMOVAL PORT-A-CATH;  Surgeon: Haywood Lasso, MD;  Location: [facility];  Service: General;  Laterality: Left;  . PORTACATH PLACEMENT  06/20/2011   Procedure: INSERTION PORT-A-CATH;  Surgeon: Haywood Lasso, MD;  Location: [facility];  Service: General;  Laterality: N/A;  . ROBOTIC ASSISTED TOTAL HYSTERECTOMY WITH BILATERAL SALPINGO OOPHERECTOMY Bilateral 12/23/2012   Procedure: ROBOTIC ASSISTED TOTAL HYSTERECTOMY WITH BILATERAL SALPINGO OOPHORECTOMY;  Surgeon: Marvene Staff, MD;  Location: [facility];  Service: Gynecology;  Laterality: Bilateral;  . WISDOM TOOTH EXTRACTION      FAMILY HISTORY Family History  Problem Relation Age of Onset  . Breast cancer Maternal Aunt 51  . Pancreatic cancer Maternal Grandfather   . Pancreatic cancer Maternal Aunt     died in her 70s  . Leukemia Maternal Aunt     died in her 34s  . Ovarian cancer Cousin 56    maternal cousin   the patient's father is alive, currently 63 years old. The patient's mother died from complications of diabetes at the age of 26. The patient had no brothers, 4 sisters. One sister has died from congestive heart failure. The patient's mother is one of 5 sisters. One of them was diagnosed with breast cancer the age of 24. Also a cousin on the maternal side it  was diagnosed with ovarian cancer. This was approximately age 84. There is also colon cancer in the family. The patient has had extensive genetic testing summarized below. She is however BRCA negative  GYNECOLOGIC HISTORY:  Patient's last menstrual period was 07/05/2011. Menarche age 22, she is GX P0. She underwent total hysterectomy with bilateral salpingo-oophorectomy December 2014.  SOCIAL HISTORY:  Sena works at [employer] for [employer]. She mostly deals with government documents. She is single, lives by herself, with no pets. She attends [employer] locally.    ADVANCED DIRECTIVES: Not in place. The patient has a documents  and intends to name her sister Tiare Rohlman as healthcare power of attorney. Joelene Millin lives in [place] and can be reached at [place]: Social History  Substance Use Topics  . Smoking status: Never Smoker  . Smokeless tobacco: Never Used  . Alcohol use No     Colonoscopy:  PAP:  Bone density:  Lipid panel:  Allergies  Allergen Reactions  . Allegra [Fexofenadine] Hives    Abdomen only  . Shellfish Allergy Hives    Abdomen only    Current Outpatient Prescriptions  Medication Sig Dispense Refill  . acetaminophen (TYLENOL) 325 MG tablet Take 2 tablets (650 mg total) by mouth every 6 (six) hours as needed for mild pain, moderate pain, fever or headache.    . Calcium Carbonate-Vit D-Min (CALCIUM 1200 PO) Take 1 tablet by mouth 2 (two) times daily.    . Cholecalciferol (VITAMIN D3) 2000 UNITS TABS Take 2,000 Units by mouth daily.    . Multiple Vitamin (MULTIVITAMIN) tablet Take 1 tablet by mouth daily.    . palbociclib (IBRANCE) 75 MG capsule TAKE 1 CAPSULE BY MOUTH every other day for 21 days (total 11 capsules) then off a week 11 capsule 6   No current facility-administered medications for this visit.    Facility-Administered Medications Ordered in Other Visits  Medication Dose Route Frequency Provider Last Rate Last Dose  . technetium medronate (TC-MDP) injection 25 millicurie  25 millicurie Intravenous Once PRN Barnet Glasgow, MD        OBJECTIVE: Middle-aged [place] American woman In no acute distress  Vitals:   04/10/16 1219  BP: 126/65  Pulse: 88  Resp: 20  Temp: 98 F (36.7 C)     Body mass index is 19.61 kg/m.    ECOG FS:1 - Symptomatic but completely ambulatory   Sclerae unicteric, EOMs intact Oropharynx clear and moist No cervical or supraclavicular adenopathy Lungs no rales or rhonchi Heart regular rate and rhythm Abd soft, nontender, positive bowel sounds MSK no focal spinal tenderness, no upper extremity lymphedema Neuro:  nonfocal, well oriented, appropriate affect Breasts: Status post right mastectomy with no evidence of chest wall recurrence; the left breast is benign. Both axillae are benign.  LAB RESULTS:  CMP     Component Value Date/Time   NA 144 03/13/2016 0850   K 3.6 03/13/2016 0850   CL 103 04/21/2014 0910   CL 102 01/16/2012 0804   CO2 29 03/13/2016 0850   GLUCOSE 91 03/13/2016 0850   GLUCOSE 92 01/16/2012 0804   BUN 15.9 03/13/2016 0850   CREATININE 0.8 03/13/2016 0850   CALCIUM 9.8 03/13/2016 0850   PROT 7.9 03/13/2016 0850   ALBUMIN 4.0 03/13/2016 0850   AST 18 03/13/2016 0850   ALT 13 03/13/2016 0850   ALKPHOS 107 03/13/2016 0850   BILITOT 0.24 03/13/2016 0850   GFRNONAA >90 04/21/2014 0910   GFRAA >90 04/21/2014  0762    I No results found for: SPEP  Lab Results  Component Value Date   WBC 4.5 04/10/2016   NEUTROABS 2.4 04/10/2016   HGB 12.6 04/10/2016   HCT 37.4 04/10/2016   MCV 94.0 04/10/2016   PLT 176 04/10/2016      Chemistry      Component Value Date/Time   NA 144 03/13/2016 0850   K 3.6 03/13/2016 0850   CL 103 04/21/2014 0910   CL 102 01/16/2012 0804   CO2 29 03/13/2016 0850   BUN 15.9 03/13/2016 0850   CREATININE 0.8 03/13/2016 0850      Component Value Date/Time   CALCIUM 9.8 03/13/2016 0850   ALKPHOS 107 03/13/2016 0850   AST 18 03/13/2016 0850   ALT 13 03/13/2016 0850   BILITOT 0.24 03/13/2016 0850       Lab Results  Component Value Date   LABCA2 24 05/31/2011    No components found for: [id number]  No results for input(s): INR in the last 168 hours.  Urinalysis No results found for: COLORURINE  STUDIES: Ct Chest W Contrast  Result Date: 04/06/2016 CLINICAL DATA:  Breast cancer with bone and brain metastases. EXAM: CT CHEST WITH CONTRAST TECHNIQUE: Multidetector CT imaging of the chest was performed during intravenous contrast administration. CONTRAST:  30m ISOVUE-300 IOPAMIDOL (ISOVUE-300) INJECTION 61% COMPARISON:  01/14/2016  FINDINGS: Cardiovascular: The heart size is normal.  No pericardial effusion. Mediastinum/Nodes: No mediastinal lymphadenopathy. There is no hilar lymphadenopathy. The esophagus has normal imaging features. There is no axillary lymphadenopathy. Lungs/Pleura: Pleural-parenchymal scarring in the lung apices, right greater than left without substantial change. Subpleural scarring anterior right lung unchanged. Multiple scattered tiny bilateral pulmonary nodules are stable. The 5 mm left lower lobe pulmonary nodule measured on the prior study is stable at 5 mm (image 105 series 5). Upper Abdomen: 11 mm hypervascular focus in the dome of the right liver was present previously but is more conspicuous on today's study likely due to bolus timing. Multiple hypoattenuating liver lesions are unchanged, likely cysts. Musculoskeletal: Multiple lytic and sclerotic lesions are it again identified in the spine without substantial interval change. Lucent lesion in the upper sternum is stable. IMPRESSION: 1. Stable exam.  No new or progressive findings. 2. Numerous tiny bilateral pulmonary nodules unchanged. No new or progressive pulmonary nodule. 3. The lytic and sclerotic lesions identified in the spine and sternum, stable. Electronically Signed   By: EMisty StanleyM.D.   On: 04/06/2016 12:26   Mr BJeri CosWGYContrast  Result Date: 04/03/2016 CLINICAL DATA:  Follow-up of treated brain metastases. SRS to Lt Post Centrum Semiovale to 20 Gy given 04/29/2014. SRS RIGHT parietal target 12/23/2015, 20 Gy. EXAM: MRI HEAD WITHOUT AND WITH CONTRAST TECHNIQUE: Multiplanar, multiecho pulse sequences of the brain and surrounding structures were obtained without and with intravenous contrast. CONTRAST:  123mMULTIHANCE GADOBENATE DIMEGLUMINE 529 MG/ML IV SOLN COMPARISON:  Multiple priors, most recent 12/09/2015. FINDINGS: Brain: The recently treated RIGHT parietal metastasis is worse. The lesion is increased in size, now 6 mm compared to 4  mm previously. Tiny amount of central necrosis. Reference image 90 series 10. In addition, there is a moderate amount of vasogenic edema surrounding the lesion, not present previously. No midline shift. The lesion is not hemorrhagic nor is there significant restriction. No new lesions are present. Unchanged previously treated LEFT periatrial region periventricular LEFT parietal metastasis, chronic blood products both within the lesion and along the biopsy tract, stable 11 x 13  mm cross-section. Stable cerebral volume. No significant white matter disease. Vascular: Normal flow voids. Skull and upper cervical spine: No calvarial lesions. LEFT temporal bone lesion stable, 15 x 12 mm cross-section, image 47 series 10. Sinuses/Orbits: Negative. Other: None. IMPRESSION: Interval increase in size, persistent enhancement, and new vasogenic edema associated with the treated RIGHT parietal metastasis. Differential considerations are post treatment effect versus progression of metastatic disease. There was similar progression of the previously treated LEFT periatrial lesion in 2016 which required AutoLITT at [facility]. Continued short-term surveillance warranted. Electronically Signed   By: Staci Righter M.D.   On: 04/03/2016 15:49   Nm Bone Scan Whole Body  Result Date: 04/06/2016 CLINICAL DATA:  RIGHT breast cancer, bone metastases, restaging EXAM: NUCLEAR MEDICINE WHOLE BODY BONE SCAN TECHNIQUE: Whole body anterior and posterior images were obtained approximately 3 hours after intravenous injection of radiopharmaceutical. RADIOPHARMACEUTICALS:  22 mCi Technetium-41mMDP IV COMPARISON:  01/14/2016 Correlation: CT chest 04/06/2016 FINDINGS: Multiple foci of abnormal increased tracer localization are identified including the vertex of the calvarium, LEFT ischium, LEFT anterior eighth rib, RIGHT anterior second rib, and multiple sites in the thoracic and lumbar spine suspicious for osseous metastases. These lesions were present  on the previous exam though increased tracer localization is seen in the lumbar spine lesion at approximately L4. Question uptake at the LEFT pubic body versus bladder shine-through. Uptake at the superior acetabular may be degenerative the metastatic lesions not completely excluded. No additional sites of abnormal osseous tracer accumulation identified. Expected urinary tract and soft tissue distribution of tracer IMPRESSION: Multiple sites of abnormal osseous tracer accumulation as above with pattern consistent with osseous metastatic disease. Compared to previous exam, no new sites of tracer activity are identified though increased tracer localization is seen in the lumbar spine at the L4 level anteriorly. Electronically Signed   By: MLavonia DanaM.D.   On: 04/06/2016 15:11    ASSESSMENT: 46y.o. BRCA negative [place] woman status post right breast upper outer quadrant and right axillary lymph node biopsy 05/18/2011, both positive for an invasive ductal carcinoma, high-grade, clinicallyT2 N1-2 or stage IIB/IIIA,  estrogen receptor 100% positive, progesterone receptor 87% positive, with an MIB-1 of 14% and no HER-2 amplification ([id number].  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   (a) VUS [place]  (2) additional right breast biopsy upper inner quadrant 06/08/2011 showed only a fibroadenoma, and central left breast biopsy for another suspicious lesion showed only fibrocystic changes ([id number]).   (3)Treated neoadjuvantly with cyclophosphamide and docetaxel x4 completed 08/29/2011.  (4) status post right modified radical mastectomy 10/03/2011 showing a residual pT1c pN1a (3/18 lymph nodes positive) invasive ductal carcinoma, grade 1,estrogen receptor 100% positive, progesterone receptor negative, with no HER-2 amplification ([id number])  (5) adjuvant radiation to the right chest wall, right  supraclavicular fossa and right scar completed 12/26/2011  (6) tamoxifen started January 2014-- discontinued April 2016 with evidence of metastatic disease  (7) status post total hysterectomy with bilateral salpingo-oophorectomy 12/23/2012 with benign pathology ([id number]  METASTATIC DISEASE 04/13/2014 (8) presented with T8 cord compression and underwent laminectomy 04/13/2014 with T8 decompression of spinal cord, posterior fixation from T6-T10 with pedicle screws and rods, posterior arthrodesis with allograft. 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   (9) additional staging studies showed (a) multiple bone lesions, mostly lytic (so not well seen on bone scan) (b) single  0.7 cm brain metastasis at L centrum semiovale 04/29/2014  (c) RUL (39m) and RLL (223m pulmonary nodules  (d) small left liver lesions, possibly mew  (10) RADIATION IN METASTATIC SETTING:  (a) SRS to Lt Post Centrum Semiovale to 20 Gy given 04/29/2014. ExacTrac Snap verification was performed for each couch angle.   (b) radiation to the T-spine completed 05/08/2014.  (c) [facility]  (d) SRS to 0.4 cm left parietal lesion 12/23/2015  (11) started fulvestrant 05/18/2014 and Palbociclib 06/01/2014  (a) Palbociclib dose decreased to 100 mg daily, 21/7 as of 08/05/2014  (b) palbociclib dose decreased to 75 mg daily, 21/7, starting May 2017  (c) palbociclib dose decreased to 75 mg every other day beginning 07/26/2015    (d) palbociclib held for month of October because of persistent low counts, resumed November  (12) started zolendronate 05/18/2014, repeated every 12 weeks  (a) July zolendronate dose held because of dental issues, resumed 10/18/2015  (b) switched to denosumab as of 01/20/2016 with concern of bony progression on zolendronate  (c) switching back to zolendronate  as of April 2018 at patient's request  PLAN: I spent approximately 30 minutes with WeAbigail Buttsith most of that time spent discussing her complex problems. We discussed her restaging studies in great detail. Her brain MRI was discussed this morning at the neuro oncology conference and the feeling there was that most likely we were dealing with a treatment effect. A repeat MRI in 3 months was recommended.  The chest CT of course was very favorable. The bone scan does not show any new lesions.  Accordingly we are continuing with the same treatment as before except that she does not like the denosumab/Xgeva. She feels it causes her more bone pain than zolendronate. She will likes to return to zolendronate. I have no problem with that. We well set her up for that not today but a month from today and she will repeat zolendronate every 3 months.  She will continue the palbociclib at the current dose and she will receive her fulvestrant again today. She will see me again in late July and she will have a repeat bone scan shortly before that visit  She knows to call for any other problems that may develop before then.     MAChauncey CruelMD   04/10/2016 12:39 PM

## 2016-04-10 NOTE — Telephone Encounter (Signed)
Gave patient AVS and calender per 04/10/2016 los and treatment plan.

## 2016-04-12 ENCOUNTER — Other Ambulatory Visit: Payer: Self-pay | Admitting: Oncology

## 2016-04-12 NOTE — Progress Notes (Unsigned)
I am copying below some notes and suggestions from our genetics counselor Roma Kayser:  I started looking into this patient as she was on the brain tumor board this AM. 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 This gene increases the risk for breast and ovarian cancer.   In the ovarian cancer population, individuals with a RAD51C mutation are more likely to have homologous recombination deficiency (HRD), and are expected to respond to PARP inhibitors better than those who do not have HRD.    There really is no information about breast cancer, RAD51C and response to PARP. However, we may be able to send her tumor to [facility] for HRD testing and determination of genomic instability. If they find a mutation in the tumor that causes genomic instability, PARP may be an option for her. It would be off label, but an option. She is so young, and I don't know how she is doing, but I thought I would offer this as an option in our pocket for when we need it.   Would you want me to do a billing investigation on her insurance to see what her cost could be?   If she is interested, we can test her tumor to see if it also has the mutation and is therefore showing instability (some tumors do not have the germline mutation, which would indicate that their tumor is not related to their mutation).   Per [facility], as far as they can tell, to date, patients have not received a bill for HRD testing. Once this has been run, if she does receive a bill, and if it presents a hardship, she can contact billing and they will work with her.

## 2016-05-05 ENCOUNTER — Other Ambulatory Visit: Payer: Self-pay | Admitting: Oncology

## 2016-05-05 DIAGNOSIS — C7931 Secondary malignant neoplasm of brain: Secondary | ICD-10-CM

## 2016-05-05 MED FILL — IBRANCE 75 MG CAPSULE: 75 | 28 days supply | Qty: 11 | Fill #0

## 2016-05-08 ENCOUNTER — Other Ambulatory Visit: Payer: Self-pay | Admitting: Oncology

## 2016-05-08 ENCOUNTER — Other Ambulatory Visit (HOSPITAL_BASED_OUTPATIENT_CLINIC_OR_DEPARTMENT_OTHER): Payer: BC Managed Care – PPO

## 2016-05-08 ENCOUNTER — Ambulatory Visit (HOSPITAL_BASED_OUTPATIENT_CLINIC_OR_DEPARTMENT_OTHER): Payer: BC Managed Care – PPO

## 2016-05-08 ENCOUNTER — Ambulatory Visit: Payer: BC Managed Care – PPO

## 2016-05-08 VITALS — BP 115/69 | HR 86 | Temp 98.6°F | Resp 16

## 2016-05-08 DIAGNOSIS — C50411 Malignant neoplasm of upper-outer quadrant of right female breast: Secondary | ICD-10-CM

## 2016-05-08 DIAGNOSIS — C7931 Secondary malignant neoplasm of brain: Secondary | ICD-10-CM

## 2016-05-08 DIAGNOSIS — C7951 Secondary malignant neoplasm of bone: Secondary | ICD-10-CM

## 2016-05-08 DIAGNOSIS — C773 Secondary and unspecified malignant neoplasm of axilla and upper limb lymph nodes: Secondary | ICD-10-CM

## 2016-05-08 DIAGNOSIS — Z5111 Encounter for antineoplastic chemotherapy: Secondary | ICD-10-CM

## 2016-05-08 DIAGNOSIS — C50919 Malignant neoplasm of unspecified site of unspecified female breast: Secondary | ICD-10-CM

## 2016-05-08 LAB — CBC WITH DIFFERENTIAL/PLATELET
BASO%: 0.9 % (ref 0.0–2.0)
Basophils Absolute: 0 10*3/uL (ref 0.0–0.1)
EOS%: 2.7 % (ref 0.0–7.0)
Eosinophils Absolute: 0.1 10*3/uL (ref 0.0–0.5)
HEMATOCRIT: 31.1 % — AB (ref 34.8–46.6)
HGB: 10.4 g/dL — ABNORMAL LOW (ref 11.6–15.9)
LYMPH#: 1.3 10*3/uL (ref 0.9–3.3)
LYMPH%: 39.1 % (ref 14.0–49.7)
MCH: 31.9 pg (ref 25.1–34.0)
MCHC: 33.5 g/dL (ref 31.5–36.0)
MCV: 95.4 fL (ref 79.5–101.0)
MONO#: 0.4 10*3/uL (ref 0.1–0.9)
MONO%: 13.2 % (ref 0.0–14.0)
NEUT#: 1.4 10*3/uL — ABNORMAL LOW (ref 1.5–6.5)
NEUT%: 44.1 % (ref 38.4–76.8)
PLATELETS: 154 10*3/uL (ref 145–400)
RBC: 3.26 10*6/uL — ABNORMAL LOW (ref 3.70–5.45)
RDW: 15.1 % — ABNORMAL HIGH (ref 11.2–14.5)
WBC: 3.3 10*3/uL — ABNORMAL LOW (ref 3.9–10.3)

## 2016-05-08 LAB — COMPREHENSIVE METABOLIC PANEL
ALT: 14 U/L (ref 0–55)
ANION GAP: 10 meq/L (ref 3–11)
AST: 22 U/L (ref 5–34)
Albumin: 3.8 g/dL (ref 3.5–5.0)
Alkaline Phosphatase: 91 U/L (ref 40–150)
BUN: 14.3 mg/dL (ref 7.0–26.0)
CALCIUM: 9.6 mg/dL (ref 8.4–10.4)
CHLORIDE: 108 meq/L (ref 98–109)
CO2: 27 mEq/L (ref 22–29)
CREATININE: 0.8 mg/dL (ref 0.6–1.1)
EGFR: >90 mL/min/{1.73_m2} (ref >90)
Glucose: 80 mg/dl (ref 70–140)
Potassium: 4.1 mEq/L (ref 3.5–5.1)
Sodium: 145 mEq/L (ref 136–145)
Total Protein: 6.9 g/dL (ref 6.4–8.3)

## 2016-05-08 MED ORDER — FULVESTRANT 250 MG/5ML IM SOLN
500.0000 mg | Freq: Once | INTRAMUSCULAR | Status: AC
  Administered 2016-05-08: 500 mg via INTRAMUSCULAR
  Filled 2016-05-08: qty 10

## 2016-05-08 MED ORDER — SODIUM CHLORIDE 0.9 % IV SOLN
4.0000 mg | Freq: Once | INTRAVENOUS | Status: AC
  Administered 2016-05-08: 4 mg via INTRAVENOUS
  Filled 2016-05-08: qty 5

## 2016-05-08 NOTE — Patient Instructions (Signed)

## 2016-05-08 NOTE — Patient Instructions (Signed)

## 2016-05-26 MED FILL — IBRANCE 75 MG CAPSULE: 75 | 28 days supply | Qty: 11 | Fill #1

## 2016-06-06 ENCOUNTER — Ambulatory Visit (HOSPITAL_BASED_OUTPATIENT_CLINIC_OR_DEPARTMENT_OTHER): Payer: BC Managed Care – PPO

## 2016-06-06 ENCOUNTER — Other Ambulatory Visit (HOSPITAL_BASED_OUTPATIENT_CLINIC_OR_DEPARTMENT_OTHER): Payer: BC Managed Care – PPO

## 2016-06-06 VITALS — BP 137/64 | HR 88 | Temp 98.2°F | Resp 18

## 2016-06-06 DIAGNOSIS — Z5111 Encounter for antineoplastic chemotherapy: Secondary | ICD-10-CM

## 2016-06-06 DIAGNOSIS — C50411 Malignant neoplasm of upper-outer quadrant of right female breast: Secondary | ICD-10-CM

## 2016-06-06 DIAGNOSIS — C7931 Secondary malignant neoplasm of brain: Principal | ICD-10-CM

## 2016-06-06 DIAGNOSIS — C7951 Secondary malignant neoplasm of bone: Secondary | ICD-10-CM | POA: Diagnosis not present

## 2016-06-06 DIAGNOSIS — C50919 Malignant neoplasm of unspecified site of unspecified female breast: Secondary | ICD-10-CM

## 2016-06-06 DIAGNOSIS — C773 Secondary and unspecified malignant neoplasm of axilla and upper limb lymph nodes: Secondary | ICD-10-CM

## 2016-06-06 LAB — COMPREHENSIVE METABOLIC PANEL
ALT: 12 U/L (ref 0–55)
AST: 21 U/L (ref 5–34)
Albumin: 3.9 g/dL (ref 3.5–5.0)
Alkaline Phosphatase: 95 U/L (ref 40–150)
Anion Gap: 8 mEq/L (ref 3–11)
BUN: 21.2 mg/dL (ref 7.0–26.0)
CALCIUM: 10 mg/dL (ref 8.4–10.4)
CHLORIDE: 107 meq/L (ref 98–109)
CO2: 29 meq/L (ref 22–29)
CREATININE: 0.9 mg/dL (ref 0.6–1.1)
EGFR: 88 mL/min/{1.73_m2} — ABNORMAL LOW (ref >90)
GLUCOSE: 90 mg/dL (ref 70–140)
POTASSIUM: 4.5 meq/L (ref 3.5–5.1)
SODIUM: 144 meq/L (ref 136–145)
Total Bilirubin: <0.22 mg/dL (ref 0.20–1.20)
Total Protein: 7.3 g/dL (ref 6.4–8.3)

## 2016-06-06 LAB — CBC WITH DIFFERENTIAL/PLATELET
BASO%: 0.3 % (ref 0.0–2.0)
BASOS ABS: 0 10*3/uL (ref 0.0–0.1)
EOS%: 4.3 % (ref 0.0–7.0)
Eosinophils Absolute: 0.2 10*3/uL (ref 0.0–0.5)
HCT: 32.7 % — ABNORMAL LOW (ref 34.8–46.6)
HGB: 10.8 g/dL — ABNORMAL LOW (ref 11.6–15.9)
LYMPH%: 39.5 % (ref 14.0–49.7)
MCH: 31 pg (ref 25.1–34.0)
MCHC: 33 g/dL (ref 31.5–36.0)
MCV: 94 fL (ref 79.5–101.0)
MONO#: 0.4 10*3/uL (ref 0.1–0.9)
MONO%: 11 % (ref 0.0–14.0)
NEUT#: 1.6 10*3/uL (ref 1.5–6.5)
NEUT%: 44.9 % (ref 38.4–76.8)
Platelets: 132 10*3/uL — ABNORMAL LOW (ref 145–400)
RBC: 3.48 10*6/uL — AB (ref 3.70–5.45)
RDW: 13.8 % (ref 11.2–14.5)
WBC: 3.5 10*3/uL — ABNORMAL LOW (ref 3.9–10.3)
lymph#: 1.4 10*3/uL (ref 0.9–3.3)

## 2016-06-06 MED ORDER — FULVESTRANT 250 MG/5ML IM SOLN
500.0000 mg | Freq: Once | INTRAMUSCULAR | Status: AC
  Administered 2016-06-06: 500 mg via INTRAMUSCULAR
  Filled 2016-06-06: qty 10

## 2016-06-06 NOTE — Patient Instructions (Signed)

## 2016-06-19 MED FILL — IBRANCE 75 MG CAPSULE: 75 | 28 days supply | Qty: 11 | Fill #2

## 2016-06-30 ENCOUNTER — Other Ambulatory Visit: Payer: Self-pay | Admitting: *Deleted

## 2016-06-30 DIAGNOSIS — C7949 Secondary malignant neoplasm of other parts of nervous system: Principal | ICD-10-CM

## 2016-06-30 DIAGNOSIS — C7931 Secondary malignant neoplasm of brain: Secondary | ICD-10-CM

## 2016-07-03 ENCOUNTER — Other Ambulatory Visit (HOSPITAL_BASED_OUTPATIENT_CLINIC_OR_DEPARTMENT_OTHER): Payer: BC Managed Care – PPO

## 2016-07-03 ENCOUNTER — Ambulatory Visit (HOSPITAL_BASED_OUTPATIENT_CLINIC_OR_DEPARTMENT_OTHER): Payer: BC Managed Care – PPO

## 2016-07-03 ENCOUNTER — Other Ambulatory Visit: Payer: Self-pay | Admitting: Oncology

## 2016-07-03 VITALS — BP 116/63 | HR 77 | Temp 98.3°F | Resp 16

## 2016-07-03 DIAGNOSIS — C50411 Malignant neoplasm of upper-outer quadrant of right female breast: Secondary | ICD-10-CM

## 2016-07-03 DIAGNOSIS — C773 Secondary and unspecified malignant neoplasm of axilla and upper limb lymph nodes: Secondary | ICD-10-CM

## 2016-07-03 DIAGNOSIS — C7931 Secondary malignant neoplasm of brain: Secondary | ICD-10-CM

## 2016-07-03 DIAGNOSIS — C7951 Secondary malignant neoplasm of bone: Secondary | ICD-10-CM | POA: Diagnosis not present

## 2016-07-03 DIAGNOSIS — C50919 Malignant neoplasm of unspecified site of unspecified female breast: Secondary | ICD-10-CM

## 2016-07-03 DIAGNOSIS — Z5111 Encounter for antineoplastic chemotherapy: Secondary | ICD-10-CM

## 2016-07-03 LAB — COMPREHENSIVE METABOLIC PANEL
ALBUMIN: 3.7 g/dL (ref 3.5–5.0)
ALK PHOS: 93 U/L (ref 40–150)
ALT: 10 U/L (ref 0–55)
ANION GAP: 9 meq/L (ref 3–11)
AST: 20 U/L (ref 5–34)
BUN: 12 mg/dL (ref 7.0–26.0)
CALCIUM: 9.5 mg/dL (ref 8.4–10.4)
CO2: 26 mEq/L (ref 22–29)
CREATININE: 0.9 mg/dL (ref 0.6–1.1)
Chloride: 109 mEq/L (ref 98–109)
Glucose: 71 mg/dl (ref 70–140)
Potassium: 4.1 mEq/L (ref 3.5–5.1)
Sodium: 145 mEq/L (ref 136–145)
TOTAL PROTEIN: 7 g/dL (ref 6.4–8.3)
Total Bilirubin: 0.23 mg/dL (ref 0.20–1.20)

## 2016-07-03 LAB — CBC WITH DIFFERENTIAL/PLATELET
BASO%: 0.7 % (ref 0.0–2.0)
Basophils Absolute: 0 10*3/uL (ref 0.0–0.1)
EOS%: 2 % (ref 0.0–7.0)
Eosinophils Absolute: 0.1 10*3/uL (ref 0.0–0.5)
HEMATOCRIT: 31 % — AB (ref 34.8–46.6)
HEMOGLOBIN: 10.2 g/dL — AB (ref 11.6–15.9)
LYMPH#: 1.3 10*3/uL (ref 0.9–3.3)
LYMPH%: 43.7 % (ref 14.0–49.7)
MCH: 30.2 pg (ref 25.1–34.0)
MCHC: 32.8 g/dL (ref 31.5–36.0)
MCV: 92.2 fL (ref 79.5–101.0)
MONO#: 0.3 10*3/uL (ref 0.1–0.9)
MONO%: 10.8 % (ref 0.0–14.0)
NEUT#: 1.3 10*3/uL — ABNORMAL LOW (ref 1.5–6.5)
NEUT%: 42.8 % (ref 38.4–76.8)
Platelets: 121 10*3/uL — ABNORMAL LOW (ref 145–400)
RBC: 3.36 10*6/uL — ABNORMAL LOW (ref 3.70–5.45)
RDW: 14.6 % — AB (ref 11.2–14.5)
WBC: 3 10*3/uL — AB (ref 3.9–10.3)

## 2016-07-03 MED ORDER — FULVESTRANT 250 MG/5ML IM SOLN
500.0000 mg | Freq: Once | INTRAMUSCULAR | Status: AC
  Administered 2016-07-03: 500 mg via INTRAMUSCULAR
  Filled 2016-07-03: qty 10

## 2016-07-04 ENCOUNTER — Telehealth: Payer: Self-pay | Admitting: *Deleted

## 2016-07-04 NOTE — Progress Notes (Signed)
Carolyn [name] 46 y.o. woman with metastatic ER PR postive, HER-2 negative right invasive carcinoma of the breast with metastatic disease to the brain radiation completed 12-23-15, review 07-13-16 MRI brain FU.   Headache:None Pain:No Dizziness:Had dizziness after the MRI and has dizziness after taking Ibrance some days. Nausea/Vomiting/Ataxia:None,Get off balance at times ambulates with a cane. Visional changes(Blurred/Diplopia (double vision), blind spots and peripheral vision changes): Wears glasses Ring in ears:None Fatigue:Reports feeling fatigue with low energy level. Cognitive changes: No change with her memory, alert and oriented x 3. Weight: Wt Readings from Last 3 Encounters:  07/18/16 133 lb 12.8 oz (60.7 kg)  04/10/16 129 lb (58.5 kg)  04/05/16 130 lb (59 kg)   Appetite: Good eating three meals daily. Imaging:07-13-16 MRI brain w wo contrast Lab: 07-03-16 CBC w diff, Cmet   04-10-16 Saw Dr. [name] Accordingly we are continuing with the same treatment as before except that she does not like the denosumab/Xgeva. She feels it causes her more bone pain than zolendronate. She will likes to return to       zolendronate. I have no problem with that. We well set her up for that not today but a month from today and she will repeat zolendronate every 3 months.    She will continue the palbociclib at the current dose and she will receive her fulvestrant again today. She will see me again in late July and she will have a repeat bone scan shortly before that visit. BP 131/81   Pulse 91   Temp 97.8 F (36.6 C) (Oral)   Resp 16   Ht '5\' 8"'  (1.727 m)   Wt 133 lb 12.8 oz (60.7 kg)   LMP 07/05/2011   SpO2 100%   BMI 20.34 kg/m

## 2016-07-13 ENCOUNTER — Other Ambulatory Visit: Payer: Self-pay | Admitting: Radiation Oncology

## 2016-07-13 ENCOUNTER — Ambulatory Visit
Admission: RE | Admit: 2016-07-13 | Discharge: 2016-07-13 | Disposition: A | Discharge status: Home / Self Care | Payer: BC Managed Care – PPO | Source: Ambulatory Visit | Attending: Radiation Oncology | Admitting: Radiation Oncology

## 2016-07-13 ENCOUNTER — Inpatient Hospital Stay: Admission: RE | Admit: 2016-07-13 | Payer: BC Managed Care – PPO | Source: Ambulatory Visit

## 2016-07-13 DIAGNOSIS — C7931 Secondary malignant neoplasm of brain: Secondary | ICD-10-CM

## 2016-07-13 MED ORDER — GADOBENATE DIMEGLUMINE 529 MG/ML IV SOLN
11.0000 mL | Freq: Once | INTRAVENOUS | Status: AC | PRN
  Administered 2016-07-13: 11 mL via INTRAVENOUS

## 2016-07-17 MED FILL — IBRANCE 75 MG CAPSULE: 75 | 28 days supply | Qty: 11 | Fill #3

## 2016-07-18 ENCOUNTER — Ambulatory Visit
Admission: RE | Admit: 2016-07-18 | Discharge: 2016-07-18 | Disposition: A | Discharge status: Home / Self Care | Payer: BC Managed Care – PPO | Source: Ambulatory Visit | Attending: Radiation Oncology | Admitting: Radiation Oncology

## 2016-07-18 ENCOUNTER — Encounter: Payer: Self-pay | Admitting: Radiation Oncology

## 2016-07-18 VITALS — BP 131/81 | HR 91 | Temp 97.8°F | Resp 16 | Ht 68.0 in | Wt 133.8 lb

## 2016-07-18 DIAGNOSIS — C7951 Secondary malignant neoplasm of bone: Secondary | ICD-10-CM | POA: Diagnosis not present

## 2016-07-18 DIAGNOSIS — C7931 Secondary malignant neoplasm of brain: Secondary | ICD-10-CM | POA: Insufficient documentation

## 2016-07-18 DIAGNOSIS — Z853 Personal history of malignant neoplasm of breast: Secondary | ICD-10-CM | POA: Insufficient documentation

## 2016-07-18 DIAGNOSIS — C50919 Malignant neoplasm of unspecified site of unspecified female breast: Secondary | ICD-10-CM

## 2016-07-18 NOTE — Progress Notes (Signed)
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  04/29/2014 SRS Treatment:  PTV1 Left parietal lesion to a dose of 20 gray in 1 fraction  04/27/14-05/08/14:  Palliative radiotherapy to the thoracic spine 30 Gy in 10 fractions  11/08/2011-12/26/2011: Right chest wall / 50.4 Gray @ 1.8 Pearline Cables per fraction x 28 fractions Right Supraclavicular fossa / 45 Gray '@1' .8 Gray per fraction x 25 fractions Right scar / 10 Gray at [employer] per fraction x 5 fractions  Narrative:  The patient returns today for routine follow-up. This is a young patient who was diagnosed with metastatic right breast cancer to the brain who underwent stereotactic radiosurgery to the brain lesion in 2016. She continued to have edema and subsequently was referred to Dr. Salomon Fick at [facility]. She underwent LITT on 11/20/14.  She proceeded with another course of SRS in December 2017. She does have additional disease in the thoracic spine but has not required radiotherapy to this site since 2016.She continues in surveillance and has has not had progressive disease in the past few scans. She underwent MRI on 07/13/16 which revealed stable to slightly smaller treated right parietal/occipital metastsis and stable treated left periatrial sites. No new disease was noted.   On review of systems, the patient reports that she feels as though she is doing pretty well. Her most recent addition to her systemic therapy is Leslee Home, she reports seems to be going well. She does report  describes some dizziness when taking this medication can last for several hours after. She also reports that after her most recent MRI scan, she felt dizzy for several days, this is improving at this time. She did not have symptoms of dizziness headache, speech or auditory change I to this and reports that she has healing well overall at this time. She denies any chest pain, shortness of breath, cough, fevers, chills, night sweats, unintended weight changes. She denies any bowel or bladder disturbances, and denies abdominal pain, nausea or vomiting. She denies any new musculoskeletal or joint aches or pains, new skin lesions or concerns. A complete review of systems is obtained and is otherwise negative.  Past Medical History:  Past Medical History:  Diagnosis Date  . Breast cancer ([place]) 09/2011   invasive ductal carcinoma metastatic ca in 3/14 lymph nodes  . History of chemotherapy 06/27/11 -09/04/11   neoadjuvant  . Hx of radiation therapy 11/08/11 -12/26/11   right chest wall/supraclav fossa, right scar  . S/P radiation therapy 05/08/14   SRS brain    Past Surgical History: Past Surgical History:  Procedure Laterality Date  . brain surgery-LITT Left 11/20/14   LITT procedure for Lt brain met.  Marland Kitchen BREAST BIOPSY     Left  . CHOLECYSTECTOMY N/A 03/01/2014   Procedure: LAPAROSCOPIC CHOLECYSTECTOMY;  Surgeon: Leighton Ruff, MD;  Location: [facility];  Service: General;  Laterality: N/A;  . LAMINECTOMY N/A 04/13/2014   Procedure: THORACIC LAMINECTOMY WITH FIXATION THORACIC SIX-THORACIC TEN FUSION;  Surgeon: Kristeen Miss, MD;  Location: [facility]  [facility];  Service: Neurosurgery;  Laterality: N/A;  . MODIFIED MASTECTOMY  10/03/2011   Procedure: MODIFIED MASTECTOMY;  Surgeon: Haywood Lasso, MD;  Location: [place];  Service: General;  Laterality: Right;  . PORT-A-CATH REMOVAL  01/30/2012   Procedure: MINOR REMOVAL PORT-A-CATH;  Surgeon: Haywood Lasso, MD;  Location: [place];  Service: General;  Laterality: Left;  . PORTACATH PLACEMENT  06/20/2011   Procedure: INSERTION PORT-A-CATH;  Surgeon: Haywood Lasso, MD;  Location: [place];  Service: General;  Laterality: N/A;  . ROBOTIC ASSISTED TOTAL HYSTERECTOMY WITH BILATERAL SALPINGO OOPHERECTOMY Bilateral 12/23/2012   Procedure: ROBOTIC ASSISTED TOTAL HYSTERECTOMY WITH BILATERAL SALPINGO OOPHORECTOMY;  Surgeon: Marvene Staff, MD;  Location: [facility];  Service: Gynecology;  Laterality: Bilateral;  . WISDOM TOOTH EXTRACTION      Social History:  Social History   Social History  . Marital status: Single    Spouse name: N/A  . Number of children: N/A  . Years of education: N/A   Occupational History  . Not on file.   Social History Main Topics  . Smoking status: Never Smoker  . Smokeless tobacco: Never Used  . Alcohol use No  . Drug use: No  . Sexual activity: Not Currently    Birth control/ protection: Condom   Other Topics Concern  . Not on file   Social History Narrative  . No narrative on file    Family History: Family History  Problem Relation Age of Onset  . Breast cancer Maternal Aunt 60  . Pancreatic cancer Maternal Grandfather   . Pancreatic cancer Maternal Aunt        died in her 52s  . Leukemia Maternal Aunt        died in her 72s  . Ovarian cancer Cousin 45       maternal cousin                               ALLERGIES:  is allergic to allegra [fexofenadine] and shellfish allergy.  Meds: Current Outpatient Prescriptions  Medication Sig Dispense Refill  . acetaminophen (TYLENOL) 325 MG tablet Take 2 tablets (650 mg total) by mouth every 6 (six) hours as needed for mild pain, moderate pain, fever or headache.    . Calcium Carbonate-Vit D-Min (CALCIUM 1200 PO) Take 1 tablet by mouth 2 (two) times daily.    . Cholecalciferol (VITAMIN D3) 2000 UNITS TABS Take 2,000 Units by mouth daily.    Leslee Home 75 MG capsule TAKE 1 CAPSULE BY MOUTH EVERY OTHER DAY FOR 21 DAYS THEN  OFF A WEEK 11 capsule 6  . Multiple Vitamin (MULTIVITAMIN) tablet Take 1 tablet by mouth daily.     No current facility-administered medications for this encounter.     Physical Findings:  height is '5\' 8"'  (1.727 m) and weight is 133 lb 12.8 oz (60.7 kg). Her oral temperature is 97.8 F (36.6 C). Her blood pressure is 131/81 and her pulse is 91. Her respiration is 16 and oxygen saturation is 100%.   Pain Assessment Pain Score: 0-No pain/10  In general this is a well appearing African-American female in no acute distress. She's alert and oriented x4 and appropriate throughout the examination. Cardiopulmonary assessment is negative for acute distress and she exhibits normal effort. She appears to be neurologically intact grossly without focal deficits.    Lab Findings: Lab Results  Component Value Date  WBC 3.0 (L) 07/03/2016   HGB 10.2 (L) 07/03/2016   HCT 31.0 (L) 07/03/2016   MCV 92.2 07/03/2016   PLT 121 (L) 07/03/2016     Radiographic Findings: Mr Jeri Cos OT Contrast  Result Date: 07/13/2016 CLINICAL DATA:  46 year old female with metastatic breast cancer. Completed stereotactic radiosurgery to a left parietal lesion on 04/29/2014 to a dose of 20 gray, followed by auto-litt procedure (laser ablation) November 2016. New small right parietal lobe metastasis discovered on MRI in November 2017 status post SRS on 12/1415 to a dose of 20 Gy. EXAM: MRI HEAD WITHOUT AND WITH CONTRAST TECHNIQUE: Multiplanar, multiecho pulse sequences of the brain and surrounding structures were obtained without and with intravenous contrast. CONTRAST:  68m MULTIHANCE GADOBENATE DIMEGLUMINE 529 MG/ML IV SOLN COMPARISON:  04/03/2016 and earlier. FINDINGS: Brain: Stable cerebral volume. No intracranial mass effect. No ventriculomegaly. No restricted diffusion or evidence of acute infarction. No acute intracranial hemorrhage identified. Stable 15 mm area of FLAIR hyperintensity surrounding the treated right  inferior parietal or superior occipital lobe metastasis since March. Continued enhancement of the metastasis measuring 5-6 mm which is stable to slightly smaller than in March (series 10, image 87). No regional mass effect. Stable post treatment appearance of the left periatrial white matter lesion with a mix of intrinsic T1 hyperintense blood products and mild enhancement. No edema or mass effect at that site. No new abnormal enhancement in the brain. No dural thickening. No new signal abnormality. Negative pituitary and cervicomedullary junction. Vascular: Major intracranial vascular flow voids are stable. Skull and upper cervical spine: Negative visualized cervical spinal cord. 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 Sinuses/Orbits: Normal orbits soft tissues. Visualized paranasal sinuses and mastoids are stable and well pneumatized. Other: Negative scalp soft tissues. IMPRESSION: 1. Stable to slightly smaller treated right parietal/occipital enhancing metastasis since March. And stable surrounding FLAIR hyperintensity with no mass effect. 2. Stable and satisfactory treated left periatrial white matter metastasis. 3. No new brain metastasis or new intracranial abnormality identified. Electronically Signed   By: HGenevie AnnM.D.   On: 07/13/2016 11:49    Impression/Plan: 1. 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 Ms. GOverdorfappears to be doing very well radiographically as well as clinically. We will continue to follow her closely with repeat serial studies. MRI scans are no longer being administered with gadolinium, rather multi-enhanced, and we are unaware of any side effects this is causing similar to what the patient as described however we will follow this closely. I  suggested that she hydrate well prior to her next scan. We will also discusses with radiology and informed her of any additional information to share. She will continue systemic therapy with Dr. MJana Hakimotherwise. Also of note she does have a history of thoracic spine disease but this is not required treatment, though this topically be revisited if need be. We will plan to see her back in 3 months time after her next MRI.     ACarola Rhine PAC

## 2016-07-18 NOTE — Addendum Note (Signed)
Encounter addended by: Malena Edman, RN on: 07/18/2016 11:49 AM<BR>    Actions taken: Charge Capture section accepted

## 2016-07-19 ENCOUNTER — Telehealth: Payer: Self-pay | Admitting: Oncology

## 2016-07-19 NOTE — Telephone Encounter (Signed)
lvm about appointment changes and a call back number °

## 2016-07-31 ENCOUNTER — Ambulatory Visit: Payer: BC Managed Care – PPO

## 2016-07-31 ENCOUNTER — Ambulatory Visit: Payer: BC Managed Care – PPO | Admitting: Oncology

## 2016-07-31 ENCOUNTER — Other Ambulatory Visit: Payer: BC Managed Care – PPO

## 2016-07-31 NOTE — Progress Notes (Signed)
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 She continues on fulvestrant, which she receives every 4 weeks, with a dose due today. However she really does not like the shots. They're painful and sometimes there is a pain that goes from the left hip to her left foot for a day or 2 after an injection. She would like to stop that if possible.  She is also on palbociclib area today is day 1 of the current cycle. She tolerates that with no side effects that she is aware of.  She just had a brain MRI which shows no evidence of progression in the brain.  We switched her to zolendronate 3 months ago, and a dose is due today. She tolerates that with no side effects that she is aware of  REVIEW OF SYSTEMS: Kimbella continues to work and enjoys her work. She denies unusual headaches, visual changes, dizziness, or falls. There has been no change in bowel or bladder habits. A detailed review of systems today was otherwise stable   BREAST CANCER HISTORY: From doctor Khan's of original intake node 05/31/2011:  "Laurna Shetley is a 46 y.o. female. Without significant past medical history. She underwent a mammogram that showed calcifications  measuring 5 cm on the right breast. She then went on To have an ultrasound which showed the area to measure 2.9 cm. She was also found to have a right axillary lymph node that was suspicious for a malignancy. She had a biopsy of the calcifications that showed high-grade ductal carcinoma in situ. 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 In the lymph node biopsy there was no lymphatic tissue seen and was felt that the node was replaced by tumor. Patient went on to have an MRI of the bilateral breasts performed on evening of 05/30/2011. The MRI showed in the right breast 5 x 3 x 4.5 cm mass abutting the chest wall. About 7 cm away from this there was another mass in the upper inner quadrant that measured 1.5 x 1.7 x 1.5 cm. On the contralateral breast, that is the left breast a 2 cm area suspicious enhancement was noted also this up to date has not been biopsied and arrangements are being made for the biopsy to be performed. In this side there were no suspicious lymph nodes. The prognostic panel is pending. Patient is otherwise without any complaints. "  METASTATIC DISEASE: From the earlier summary note:  Khamille noted some strange feelings around her umbilicus 93/26/7124. This felt like an area of numbness. Over the next 2 days she noted some leg weakness and difficulty walking, so she presented to the ED 04/12/2014. MRI of the thoraco-lumbar spine was obtained 04/12/2014 showing multiple bone lesions and compression fracture at T8 with retropulsion and cord compression. On 04/13/2014 she underwent Laminectomy T8 decompression  of spinal cord posterior fixation from T6-T10 with pedicle screws and rods posterior arthrodesis with allograft. The pathology from this procedure ([id number] [phone]) showed metastatic adenocarcinoma which was estrogen receptor 69% positive, with moderate staining intensity, progesterone receptor negative. HER-2 could not be obtained.  The MRi review  suggested possible brain involvement and on 04/14/2014 she had a brain MRI which showed a 0.7 cm lesion in the L centrum semiovale. There were nonspecific L temporal bone changes and also possible involvement of the clivus and calvarium, but no other parenchymal brain lesions. Further staging studies included a bone scan, which failed to show the lytic lesion seen on other scans, and CTs of the chest, abdomen and pelvis on 06/08/2011, which showed very small right lung and left liver lesions which will require follow-up  Her subsequent history is as detailed below   PAST MEDICAL HISTORY: Past Medical History:  Diagnosis Date  . Breast cancer ([place]) 09/2011   invasive ductal carcinoma metastatic ca in 3/14 lymph nodes  . History of chemotherapy 06/27/11 -09/04/11   neoadjuvant  . Hx of radiation therapy 11/08/11 -12/26/11   right chest wall/supraclav fossa, right scar  . S/P radiation therapy 05/08/14   SRS brain    PAST SURGICAL HISTORY: Past Surgical History:  Procedure Laterality Date  . brain surgery-LITT Left 11/20/14   LITT procedure for Lt brain met.  Marland Kitchen BREAST BIOPSY     Left  . CHOLECYSTECTOMY N/A 03/01/2014   Procedure: LAPAROSCOPIC CHOLECYSTECTOMY;  Surgeon: Leighton Ruff, MD;  Location: [facility];  Service: General;  Laterality: N/A;  . LAMINECTOMY N/A 04/13/2014   Procedure: THORACIC LAMINECTOMY WITH FIXATION THORACIC SIX-THORACIC TEN FUSION;  Surgeon: Kristeen Miss, MD;  Location: [facility];  Service: Neurosurgery;  Laterality: N/A;  . MODIFIED MASTECTOMY  10/03/2011   Procedure: MODIFIED MASTECTOMY;  Surgeon: Haywood Lasso, MD;  Location: [facility];  Service: General;  Laterality: Right;  . PORT-A-CATH REMOVAL  01/30/2012   Procedure: MINOR REMOVAL PORT-A-CATH;  Surgeon: Haywood Lasso, MD;  Location: [facility];  Service: General;  Laterality: Left;  . PORTACATH PLACEMENT  06/20/2011   Procedure: INSERTION PORT-A-CATH;  Surgeon: Haywood Lasso, MD;  Location: [facility];  Service: General;  Laterality: N/A;  . ROBOTIC ASSISTED TOTAL HYSTERECTOMY WITH BILATERAL SALPINGO OOPHERECTOMY Bilateral 12/23/2012   Procedure: ROBOTIC ASSISTED TOTAL HYSTERECTOMY WITH BILATERAL SALPINGO OOPHORECTOMY;  Surgeon: Marvene Staff, MD;  Location: [facility];  Service: Gynecology;  Laterality: Bilateral;  . WISDOM TOOTH EXTRACTION      FAMILY HISTORY Family History  Problem Relation Age of Onset  . Breast cancer Maternal Aunt 72  . Pancreatic cancer Maternal Grandfather   . Pancreatic cancer Maternal Aunt        died in her 58s  . Leukemia Maternal Aunt        died in her 53s  . Ovarian cancer Cousin 38       maternal cousin   the patient's father is alive, currently 16 years old. The patient's mother died from complications of diabetes at the age of 58. The patient had no brothers, 4 sisters. One sister has died from congestive heart failure. The patient's mother is one of 5 sisters. One of them was diagnosed with breast cancer the age of 72. Also a cousin on the maternal side it was diagnosed with ovarian cancer. This was approximately age 19. There is also colon cancer in the family. The patient has had extensive genetic testing  summarized below. She is however BRCA negative  GYNECOLOGIC HISTORY:  Patient's last menstrual period was 07/05/2011. Menarche age 60, she is GX P0. She underwent total hysterectomy with bilateral salpingo-oophorectomy December 2014.  SOCIAL HISTORY:  Clarinda works at [employer] for [employer]. She mostly deals with government documents. She is single, lives by herself, with no pets. She attends [employer] locally.    ADVANCED DIRECTIVES: Not in place. The patient has a documents and intends to name her sister Dalores Weger as healthcare power of attorney. Joelene Millin lives in [place] and can be reached at [place]: Social History  Substance Use Topics  . Smoking status: Never Smoker  .  Smokeless tobacco: Never Used  . Alcohol use No     Colonoscopy:  PAP:  Bone density:  Lipid panel:  Allergies  Allergen Reactions  . Allegra [Fexofenadine] Hives    Abdomen only  . Shellfish Allergy Hives    Abdomen only    Current Outpatient Prescriptions  Medication Sig Dispense Refill  . acetaminophen (TYLENOL) 325 MG tablet Take 2 tablets (650 mg total) by mouth every 6 (six) hours as needed for mild pain, moderate pain, fever or headache.    . anastrozole (ARIMIDEX) 1 MG tablet Take 1 tablet (1 mg total) by mouth daily. 90 tablet 4  . Calcium Carbonate-Vit D-Min (CALCIUM 1200 PO) Take 1 tablet by mouth 2 (two) times daily.    . Cholecalciferol (VITAMIN D3) 2000 UNITS TABS Take 2,000 Units by mouth daily.    Leslee Home 75 MG capsule TAKE 1 CAPSULE BY MOUTH EVERY OTHER DAY FOR 21 DAYS THEN OFF A WEEK 11 capsule 6  . Multiple Vitamin (MULTIVITAMIN) tablet Take 1 tablet by mouth daily.     No current facility-administered medications for this visit.     OBJECTIVE: Middle-aged African American womanWho appears stated age  82:   08/01/16 1507  BP: 136/66  Pulse: 86  Resp: 18  Temp: 98.2 F (36.8 C)     Body mass index is 20.36 kg/m.    ECOG FS:1 - Symptomatic but completely ambulatory   Sclerae unicteric, pupils round and equal Oropharynx clear and moist No cervical or supraclavicular adenopathy Lungs no rales or rhonchi Heart regular rate and rhythm Abd soft, nontender, positive bowel sounds MSK no focal spinal tenderness, no upper extremity lymphedema Neuro: nonfocal, well oriented, appropriate affect Breasts: The right breast is status post mastectomy. There is no evidence of chest wall recurrence. The left breast is benign. Both axillae are benign.  LAB RESULTS:  CMP     Component Value Date/Time   NA 141 08/01/2016 1431   K 3.8 08/01/2016 1431   CL 103 04/21/2014 0910   CL 102 01/16/2012 0804   CO2 25 08/01/2016 1431   GLUCOSE 87 08/01/2016 1431    GLUCOSE 92 01/16/2012 0804   BUN 14.4 08/01/2016 1431   CREATININE 0.9 08/01/2016 1431   CALCIUM 9.5 08/01/2016 1431   PROT 7.8 08/01/2016 1431   ALBUMIN 4.1 08/01/2016 1431   AST 26 08/01/2016 1431   ALT 13 08/01/2016 1431   ALKPHOS 105 08/01/2016 1431   BILITOT 0.30 08/01/2016 1431   GFRNONAA >90 04/21/2014 0910   GFRAA >90 04/21/2014 0910    I No results found for: SPEP  Lab Results  Component Value Date   WBC 4.2 08/01/2016   NEUTROABS 2.1 08/01/2016   HGB 10.5 (L) 08/01/2016   HCT 31.8 (L) 08/01/2016   MCV 93.4 08/01/2016  PLT 108 (L) 08/01/2016      Chemistry      Component Value Date/Time   NA 141 08/01/2016 1431   K 3.8 08/01/2016 1431   CL 103 04/21/2014 0910   CL 102 01/16/2012 0804   CO2 25 08/01/2016 1431   BUN 14.4 08/01/2016 1431   CREATININE 0.9 08/01/2016 1431      Component Value Date/Time   CALCIUM 9.5 08/01/2016 1431   ALKPHOS 105 08/01/2016 1431   AST 26 08/01/2016 1431   ALT 13 08/01/2016 1431   BILITOT 0.30 08/01/2016 1431       Lab Results  Component Value Date   LABCA2 24 05/31/2011    No components found for: [id number]  No results for input(s): INR in the last 168 hours.  Urinalysis No results found for: COLORURINE  STUDIES: Mr Jeri Cos Wo Contrast  Result Date: 07/13/2016 CLINICAL DATA:  46 year old female with metastatic breast cancer. Completed stereotactic radiosurgery to a left parietal lesion on 04/29/2014 to a dose of 20 gray, followed by auto-litt procedure (laser ablation) November 2016. New small right parietal lobe metastasis discovered on MRI in November 2017 status post SRS on 12/1415 to a dose of 20 Gy. EXAM: MRI HEAD WITHOUT AND WITH CONTRAST TECHNIQUE: Multiplanar, multiecho pulse sequences of the brain and surrounding structures were obtained without and with intravenous contrast. CONTRAST:  48m MULTIHANCE GADOBENATE DIMEGLUMINE 529 MG/ML IV SOLN COMPARISON:  04/03/2016 and earlier. FINDINGS: Brain: Stable  cerebral volume. No intracranial mass effect. No ventriculomegaly. No restricted diffusion or evidence of acute infarction. No acute intracranial hemorrhage identified. Stable 15 mm area of FLAIR hyperintensity surrounding the treated right inferior parietal or superior occipital lobe metastasis since March. Continued enhancement of the metastasis measuring 5-6 mm which is stable to slightly smaller than in March (series 10, image 87). No regional mass effect. Stable post treatment appearance of the left periatrial white matter lesion with a mix of intrinsic T1 hyperintense blood products and mild enhancement. No edema or mass effect at that site. No new abnormal enhancement in the brain. No dural thickening. No new signal abnormality. Negative pituitary and cervicomedullary junction. Vascular: Major intracranial vascular flow voids are stable. Skull and upper cervical spine: Negative visualized cervical spinal cord. 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 Sinuses/Orbits: Normal orbits soft tissues. Visualized paranasal sinuses and mastoids are stable and well pneumatized. Other: Negative scalp soft tissues. IMPRESSION: 1. Stable to slightly smaller treated right parietal/occipital enhancing metastasis since March. And stable surrounding FLAIR hyperintensity with no mass effect. 2. Stable and satisfactory treated left periatrial white matter metastasis. 3. No new brain metastasis or new intracranial abnormality identified. Electronically Signed   By: HGenevie AnnM.D.   On: 07/13/2016 11:49    ASSESSMENT: 46y.o. BRCA negative  woman status post right breast upper outer quadrant and right axillary lymph node biopsy 05/18/2011, both positive for an invasive ductal carcinoma, high-grade, clinicallyT2 N1-2 or stage IIB/IIIA,  estrogen  receptor 100% positive, progesterone receptor 87% positive, with an MIB-1 of 14% and no HER-2 amplification ([id number].  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   (a) VUS were also found in [place] BARD[place]  (2) additional right breast biopsy upper inner quadrant 06/08/2011 showed only a fibroadenoma, and central left breast biopsy for  another suspicious lesion showed only fibrocystic changes ([id number] and [id number]).   (3)Treated neoadjuvantly with cyclophosphamide and docetaxel x4 completed 08/29/2011.  (4) status post right modified radical mastectomy 10/03/2011 showing a residual pT1c pN1a (3/18 lymph nodes positive) invasive ductal carcinoma, grade 1,estrogen receptor 100% positive, progesterone receptor negative, with no HER-2 amplification ([id number])  (5) adjuvant radiation to the right chest wall, right supraclavicular fossa and right scar completed 12/26/2011  (6) tamoxifen started January 2014-- discontinued April 2016 with evidence of metastatic disease  (7) status post total hysterectomy with bilateral salpingo-oophorectomy 12/23/2012 with benign pathology ([id number])  METASTATIC DISEASE 04/13/2014 (8) presented with T8 cord compression and underwent laminectomy 04/13/2014 with T8 decompression of spinal cord, posterior fixation from T6-T10 with pedicle screws and rods, posterior arthrodesis with allograft. 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  ExacTrac Snap verification was performed for each couch angle.   (b) radiation to the T-spine completed 05/08/2014.  (c) [facility]  (d) SRS to 0.4 cm left parietal lesion 12/23/2015  (11) started fulvestrant 05/18/2014 and Palbociclib 06/01/2014  (a) Palbociclib dose decreased to 100 mg daily, 21/7 as of 08/05/2014  (b) palbociclib dose decreased to 75 mg daily, 21/7, starting May 2017  (c) palbociclib dose decreased to 75 mg every other day beginning 07/26/2015    (d) palbociclib held for month of October because of persistent low counts, resumed November  (e) fulvestrant discontinued after 11/02/2016 dose: Anastrozole started 08/01/2016  (e)  (12) started zolendronate 05/18/2014, repeated every 12 weeks  (a) July zolendronate dose held because of dental issues, resumed 10/18/2015  (b) switched to denosumab as of 01/20/2016 with concern of bony progression on zolendronate  (c) switching back to zolendronate as of April 2018 at patient's request  (13) most recent staging studies:  (a) brain MRI 07/13/2016 shows stable to smaller treated lesions.  (b) bone scan 04/06/2016 shows no new lesions  (c) chest CT scan 04/06/2016 shows small bilateral pulmonary nodules with no evidence of disease progression  PLAN: WeLibradas now a little more than 2 years out from definitive diagnosis of metastatic disease with no evidence of disease progression. This is very favorable.  Recent brain MRI is particularly good news. She is already scheduled to follow-up with Dr. MoLisbeth Renshawater this month and she will be set up for repeat brain MRI in about 3 months.  She does not like the fulvestrant shots, which causes her significant pain. I see no reason why we could not substitute anastrozole for fulvestrant and we did that today. She should be restaged after about 3-4 months on this new drug.  Of course we will continue to follow her lab work every month  while she is on palbociclib. She will see me 2 months from now. She knows to call for any problems that may develop before that visit.     MAChauncey CruelMD   08/01/2016 8:39 PM

## 2016-08-01 ENCOUNTER — Ambulatory Visit (HOSPITAL_BASED_OUTPATIENT_CLINIC_OR_DEPARTMENT_OTHER): Payer: BC Managed Care – PPO | Admitting: Oncology

## 2016-08-01 ENCOUNTER — Other Ambulatory Visit: Payer: Self-pay | Admitting: *Deleted

## 2016-08-01 ENCOUNTER — Ambulatory Visit (HOSPITAL_BASED_OUTPATIENT_CLINIC_OR_DEPARTMENT_OTHER): Payer: BC Managed Care – PPO

## 2016-08-01 ENCOUNTER — Ambulatory Visit: Payer: BC Managed Care – PPO

## 2016-08-01 ENCOUNTER — Other Ambulatory Visit (HOSPITAL_BASED_OUTPATIENT_CLINIC_OR_DEPARTMENT_OTHER): Payer: BC Managed Care – PPO

## 2016-08-01 VITALS — BP 136/66 | HR 86 | Temp 98.2°F | Resp 18 | Ht 68.0 in | Wt 133.9 lb

## 2016-08-01 DIAGNOSIS — C50411 Malignant neoplasm of upper-outer quadrant of right female breast: Secondary | ICD-10-CM | POA: Diagnosis not present

## 2016-08-01 DIAGNOSIS — C50919 Malignant neoplasm of unspecified site of unspecified female breast: Secondary | ICD-10-CM

## 2016-08-01 DIAGNOSIS — C7951 Secondary malignant neoplasm of bone: Secondary | ICD-10-CM

## 2016-08-01 DIAGNOSIS — Z170 Estrogen receptor positive status [ER+]: Secondary | ICD-10-CM | POA: Diagnosis not present

## 2016-08-01 DIAGNOSIS — K769 Liver disease, unspecified: Secondary | ICD-10-CM

## 2016-08-01 DIAGNOSIS — C50911 Malignant neoplasm of unspecified site of right female breast: Secondary | ICD-10-CM

## 2016-08-01 DIAGNOSIS — C7931 Secondary malignant neoplasm of brain: Secondary | ICD-10-CM | POA: Diagnosis not present

## 2016-08-01 DIAGNOSIS — C773 Secondary and unspecified malignant neoplasm of axilla and upper limb lymph nodes: Secondary | ICD-10-CM

## 2016-08-01 DIAGNOSIS — R918 Other nonspecific abnormal finding of lung field: Secondary | ICD-10-CM

## 2016-08-01 LAB — CBC WITH DIFFERENTIAL/PLATELET
BASO%: 0.5 % (ref 0.0–2.0)
Basophils Absolute: 0 10*3/uL (ref 0.0–0.1)
EOS ABS: 0 10*3/uL (ref 0.0–0.5)
EOS%: 0.6 % (ref 0.0–7.0)
HEMATOCRIT: 31.8 % — AB (ref 34.8–46.6)
HEMOGLOBIN: 10.5 g/dL — AB (ref 11.6–15.9)
LYMPH#: 1.6 10*3/uL (ref 0.9–3.3)
LYMPH%: 38.2 % (ref 14.0–49.7)
MCH: 30.8 pg (ref 25.1–34.0)
MCHC: 32.9 g/dL (ref 31.5–36.0)
MCV: 93.4 fL (ref 79.5–101.0)
MONO#: 0.4 10*3/uL (ref 0.1–0.9)
MONO%: 10.3 % (ref 0.0–14.0)
NEUT%: 50.4 % (ref 38.4–76.8)
NEUTROS ABS: 2.1 10*3/uL (ref 1.5–6.5)
PLATELETS: 108 10*3/uL — AB (ref 145–400)
RBC: 3.4 10*6/uL — ABNORMAL LOW (ref 3.70–5.45)
RDW: 17.2 % — ABNORMAL HIGH (ref 11.2–14.5)
WBC: 4.2 10*3/uL (ref 3.9–10.3)

## 2016-08-01 LAB — COMPREHENSIVE METABOLIC PANEL
ALBUMIN: 4.1 g/dL (ref 3.5–5.0)
ALK PHOS: 105 U/L (ref 40–150)
ALT: 13 U/L (ref 0–55)
ANION GAP: 10 meq/L (ref 3–11)
AST: 26 U/L (ref 5–34)
BILIRUBIN TOTAL: 0.3 mg/dL (ref 0.20–1.20)
BUN: 14.4 mg/dL (ref 7.0–26.0)
CO2: 25 mEq/L (ref 22–29)
CREATININE: 0.9 mg/dL (ref 0.6–1.1)
Calcium: 9.5 mg/dL (ref 8.4–10.4)
Chloride: 106 mEq/L (ref 98–109)
EGFR: 85 mL/min/{1.73_m2} — AB (ref >90)
Glucose: 87 mg/dl (ref 70–140)
Potassium: 3.8 mEq/L (ref 3.5–5.1)
Sodium: 141 mEq/L (ref 136–145)
TOTAL PROTEIN: 7.8 g/dL (ref 6.4–8.3)

## 2016-08-01 MED ORDER — ANASTROZOLE 1 MG PO TABS: 1.0000 mg | ORAL_TABLET | Freq: Every day | ORAL | 4 refills | Status: DC

## 2016-08-01 MED ORDER — ZOLEDRONIC ACID 4 MG/100ML IV SOLN
4.0000 mg | Freq: Once | INTRAVENOUS | Status: AC
  Administered 2016-08-01: 4 mg via INTRAVENOUS
  Filled 2016-08-01: qty 100

## 2016-08-01 MED FILL — ANASTROZOLE 1 MG TABLET: 1 | 90 days supply | Qty: 90 | Fill #0

## 2016-08-01 NOTE — Patient Instructions (Signed)

## 2016-08-02 ENCOUNTER — Telehealth: Payer: Self-pay

## 2016-08-02 NOTE — Telephone Encounter (Signed)
Spoke with pt by phone regarding her concerns with taking anastrozole.  Pt encouraged to try medication for 30 days.  Pt in aggreeance with starting medication for 30 day trial.  Informed pt this office will call her in approx. 2 wks to see how she is doing with the drug.  Pt instructed to call anytime before then with any questions or concerns.

## 2016-08-16 MED FILL — IBRANCE 75 MG CAPSULE: 75 | 28 days supply | Qty: 11 | Fill #4

## 2016-08-18 ENCOUNTER — Encounter: Payer: Self-pay | Admitting: *Deleted

## 2016-08-18 NOTE — Progress Notes (Signed)
[place] Work  Clinical Social Work was referred by patient for assessment of psychosocial needs due to transportation concerns.  Pt called inquiring about status of her SCAT application. Pt reports she dropped it off at [facility] to have it completed. Clinical Social Worker explained to pt that [place] team has not seen copy of that application. Pt reports RN was to complete. CSW contacted SCAT office to inquire on status of application. They report they received a copy of SCAT app on 08/10/16, but it is incomplete. CSW left message for pt that CSW team would be glad to assist pt in completing application, but there are parts pt would need to sign. CSW encouraged pt to return call to set up time to meet with CSW team to complete SCAT application. CSW awaits return call.   Clinical Social Work interventions:  Resource coordination  Loren Racer, [employer], OSW-C Clinical Social Worker [place]  [place] Phone: [phone] Fax: [phone]

## 2016-08-18 NOTE — Progress Notes (Signed)
[place] Work  Clinical Social Work received update from Therapist, sports, who located [employer] application.The complete form has been faxed and pt should be all set. CSW contacted pt and updated her of the status. SCAT should be complete and will contact pt accordingly. Pt appreciative, no other needs currently.     Clinical Social Work interventions:  Resource assistance  Loren Racer, [employer], OSW-C Clinical Social Worker [place]  [place] Phone: [phone] Fax: [phone]

## 2016-08-21 ENCOUNTER — Telehealth: Payer: Self-pay

## 2016-08-21 NOTE — Telephone Encounter (Signed)
Pt called that she cannot take anastrozole. It is making her legs hurt so she can barely walk, it is making her heart race and making her very nervous. Instructed pt to hold anastrozole until she hears from Dr [name].  Next labs 8/20, next MD visit 9/17

## 2016-08-25 ENCOUNTER — Telehealth: Payer: Self-pay | Admitting: *Deleted

## 2016-08-25 ENCOUNTER — Other Ambulatory Visit: Payer: Self-pay | Admitting: Radiation Therapy

## 2016-08-25 DIAGNOSIS — C7931 Secondary malignant neoplasm of brain: Principal | ICD-10-CM

## 2016-08-25 DIAGNOSIS — R29898 Other symptoms and signs involving the musculoskeletal system: Secondary | ICD-10-CM

## 2016-08-25 DIAGNOSIS — C50411 Malignant neoplasm of upper-outer quadrant of right female breast: Secondary | ICD-10-CM

## 2016-08-25 DIAGNOSIS — C7951 Secondary malignant neoplasm of bone: Secondary | ICD-10-CM

## 2016-08-25 DIAGNOSIS — C50911 Malignant neoplasm of unspecified site of right female breast: Secondary | ICD-10-CM

## 2016-08-25 DIAGNOSIS — Z170 Estrogen receptor positive status [ER+]: Principal | ICD-10-CM

## 2016-08-25 MED ORDER — TRAMADOL HCL 50 MG PO TABS: 50.0000 mg | ORAL_TABLET | Freq: Four times a day (QID) | ORAL | 3 refills | Status: DC | PRN

## 2016-08-25 MED ORDER — IBUPROFEN 800 MG PO TABS: 800.0000 mg | ORAL_TABLET | Freq: Three times a day (TID) | ORAL | 3 refills | Status: DC | PRN

## 2016-08-25 NOTE — Telephone Encounter (Signed)
This RN returned call to pt per message stating ongoing pain in bilateral legs with heaviness " even though I stopped the anastrazole 2 weeks ago "  Pt stated request for prescription of higher dose ibuprofen for pain and discomfort.  Per MD review request given for ibuprofen and tramadol as well as to obtain mri for evaluation of lower spine for concern for possible disease progression.  This RN returned call and obtained identified VM- message left per medications and that sent to [facility] per list.  Request given to return call or to speak with nurse on Monday when she comes in for lab due to further MD concerns.

## 2016-08-28 ENCOUNTER — Other Ambulatory Visit: Payer: Self-pay

## 2016-08-28 ENCOUNTER — Other Ambulatory Visit (HOSPITAL_BASED_OUTPATIENT_CLINIC_OR_DEPARTMENT_OTHER): Payer: BC Managed Care – PPO

## 2016-08-28 ENCOUNTER — Telehealth: Payer: Self-pay

## 2016-08-28 DIAGNOSIS — C50911 Malignant neoplasm of unspecified site of right female breast: Secondary | ICD-10-CM

## 2016-08-28 DIAGNOSIS — C7931 Secondary malignant neoplasm of brain: Principal | ICD-10-CM

## 2016-08-28 DIAGNOSIS — C50411 Malignant neoplasm of upper-outer quadrant of right female breast: Secondary | ICD-10-CM

## 2016-08-28 DIAGNOSIS — R29898 Other symptoms and signs involving the musculoskeletal system: Secondary | ICD-10-CM

## 2016-08-28 DIAGNOSIS — Z170 Estrogen receptor positive status [ER+]: Principal | ICD-10-CM

## 2016-08-28 DIAGNOSIS — C7951 Secondary malignant neoplasm of bone: Secondary | ICD-10-CM

## 2016-08-28 DIAGNOSIS — C50919 Malignant neoplasm of unspecified site of unspecified female breast: Secondary | ICD-10-CM

## 2016-08-28 LAB — COMPREHENSIVE METABOLIC PANEL
ALBUMIN: 3.7 g/dL (ref 3.5–5.0)
ALT: 13 U/L (ref 0–55)
AST: 35 U/L — AB (ref 5–34)
Alkaline Phosphatase: 117 U/L (ref 40–150)
Anion Gap: 9 mEq/L (ref 3–11)
BUN: 33.1 mg/dL — AB (ref 7.0–26.0)
CO2: 32 meq/L — AB (ref 22–29)
Calcium: 12.9 mg/dL — ABNORMAL HIGH (ref 8.4–10.4)
Chloride: 102 mEq/L (ref 98–109)
Creatinine: 2 mg/dL — ABNORMAL HIGH (ref 0.6–1.1)
EGFR: 34 mL/min/{1.73_m2} — AB (ref >90)
GLUCOSE: 113 mg/dL (ref 70–140)
POTASSIUM: 3.7 meq/L (ref 3.5–5.1)
SODIUM: 143 meq/L (ref 136–145)
Total Bilirubin: 0.29 mg/dL (ref 0.20–1.20)
Total Protein: 7.3 g/dL (ref 6.4–8.3)

## 2016-08-28 LAB — CBC WITH DIFFERENTIAL/PLATELET
BASO%: 0.4 % (ref 0.0–2.0)
Basophils Absolute: 0 10*3/uL (ref 0.0–0.1)
EOS%: 0.6 % (ref 0.0–7.0)
Eosinophils Absolute: 0 10*3/uL (ref 0.0–0.5)
HCT: 28.5 % — ABNORMAL LOW (ref 34.8–46.6)
HGB: 9.2 g/dL — ABNORMAL LOW (ref 11.6–15.9)
LYMPH%: 30.5 % (ref 14.0–49.7)
MCH: 30.2 pg (ref 25.1–34.0)
MCHC: 32.3 g/dL (ref 31.5–36.0)
MCV: 93.4 fL (ref 79.5–101.0)
MONO#: 0.5 10*3/uL (ref 0.1–0.9)
MONO%: 9.6 % (ref 0.0–14.0)
NEUT%: 58.9 % (ref 38.4–76.8)
NEUTROS ABS: 2.9 10*3/uL (ref 1.5–6.5)
NRBC: 0 % (ref 0–0)
Platelets: 107 10*3/uL — ABNORMAL LOW (ref 145–400)
RBC: 3.05 10*6/uL — AB (ref 3.70–5.45)
RDW: 16.1 % — AB (ref 11.2–14.5)
WBC: 5 10*3/uL (ref 3.9–10.3)
lymph#: 1.5 10*3/uL (ref 0.9–3.3)

## 2016-08-28 MED ORDER — IBUPROFEN 800 MG PO TABS: 800.0000 mg | ORAL_TABLET | Freq: Three times a day (TID) | ORAL | 3 refills | Status: DC | PRN

## 2016-08-28 MED ORDER — TRAMADOL HCL 50 MG PO TABS: 50.0000 mg | ORAL_TABLET | Freq: Four times a day (QID) | ORAL | 3 refills | Status: AC | PRN

## 2016-08-28 MED FILL — IBUPROFEN 800 MG TAB: 800 | 20 days supply | Qty: 60 | Fill #0

## 2016-08-28 MED FILL — traMADol HCL 50 MG TABS: 50 | 15 days supply | Qty: 60 | Fill #0

## 2016-08-28 NOTE — Telephone Encounter (Signed)
Spoke with pt in lobby regarding onset of leg pain reported on 08/25/16.  Prescription for tramadol and ibuprofen sent to pharmacy on 08/25/16.  Pt states she is going to pick those up when she leaves here.  Pt informed that we are waiting on prior authorization for MRI of thoracic and lumbar and once that received scans can be scheduled.  Pt verbalizes understanding and knows to call if pain is not controlled with current pain med regimen.

## 2016-09-05 ENCOUNTER — Emergency Department (HOSPITAL_COMMUNITY): Payer: BC Managed Care – PPO

## 2016-09-05 ENCOUNTER — Telehealth: Payer: Self-pay

## 2016-09-05 ENCOUNTER — Encounter (HOSPITAL_COMMUNITY): Payer: Self-pay | Admitting: *Deleted

## 2016-09-05 ENCOUNTER — Inpatient Hospital Stay (HOSPITAL_COMMUNITY)
Admission: EM | Admit: 2016-09-05 | Discharge: 2016-09-14 | DRG: 641 | Disposition: A | Discharge status: Home / Self Care | Payer: BC Managed Care – PPO | Attending: Internal Medicine | Admitting: Internal Medicine

## 2016-09-05 DIAGNOSIS — Z170 Estrogen receptor positive status [ER+]: Secondary | ICD-10-CM

## 2016-09-05 DIAGNOSIS — C50912 Malignant neoplasm of unspecified site of left female breast: Secondary | ICD-10-CM

## 2016-09-05 DIAGNOSIS — Z90722 Acquired absence of ovaries, bilateral: Secondary | ICD-10-CM

## 2016-09-05 DIAGNOSIS — C50411 Malignant neoplasm of upper-outer quadrant of right female breast: Secondary | ICD-10-CM | POA: Diagnosis not present

## 2016-09-05 DIAGNOSIS — Z853 Personal history of malignant neoplasm of breast: Secondary | ICD-10-CM | POA: Diagnosis present

## 2016-09-05 DIAGNOSIS — E440 Moderate protein-calorie malnutrition: Secondary | ICD-10-CM | POA: Diagnosis present

## 2016-09-05 DIAGNOSIS — C7951 Secondary malignant neoplasm of bone: Secondary | ICD-10-CM | POA: Diagnosis present

## 2016-09-05 DIAGNOSIS — K769 Liver disease, unspecified: Secondary | ICD-10-CM | POA: Diagnosis present

## 2016-09-05 DIAGNOSIS — Z681 Body mass index (BMI) 19 or less, adult: Secondary | ICD-10-CM | POA: Diagnosis not present

## 2016-09-05 DIAGNOSIS — R112 Nausea with vomiting, unspecified: Secondary | ICD-10-CM | POA: Diagnosis present

## 2016-09-05 DIAGNOSIS — M25552 Pain in left hip: Secondary | ICD-10-CM | POA: Diagnosis present

## 2016-09-05 DIAGNOSIS — G959 Disease of spinal cord, unspecified: Secondary | ICD-10-CM

## 2016-09-05 DIAGNOSIS — R079 Chest pain, unspecified: Secondary | ICD-10-CM

## 2016-09-05 DIAGNOSIS — Z8041 Family history of malignant neoplasm of ovary: Secondary | ICD-10-CM

## 2016-09-05 DIAGNOSIS — C7931 Secondary malignant neoplasm of brain: Secondary | ICD-10-CM | POA: Diagnosis not present

## 2016-09-05 DIAGNOSIS — Z1379 Encounter for other screening for genetic and chromosomal anomalies: Secondary | ICD-10-CM

## 2016-09-05 DIAGNOSIS — Z806 Family history of leukemia: Secondary | ICD-10-CM | POA: Diagnosis not present

## 2016-09-05 DIAGNOSIS — I200 Unstable angina: Secondary | ICD-10-CM | POA: Diagnosis not present

## 2016-09-05 DIAGNOSIS — T40605A Adverse effect of unspecified narcotics, initial encounter: Secondary | ICD-10-CM | POA: Diagnosis not present

## 2016-09-05 DIAGNOSIS — Z79899 Other long term (current) drug therapy: Secondary | ICD-10-CM

## 2016-09-05 DIAGNOSIS — M545 Low back pain: Secondary | ICD-10-CM | POA: Diagnosis not present

## 2016-09-05 DIAGNOSIS — M5442 Lumbago with sciatica, left side: Secondary | ICD-10-CM | POA: Diagnosis present

## 2016-09-05 DIAGNOSIS — Z91013 Allergy to seafood: Secondary | ICD-10-CM

## 2016-09-05 DIAGNOSIS — Z9011 Acquired absence of right breast and nipple: Secondary | ICD-10-CM

## 2016-09-05 DIAGNOSIS — M5432 Sciatica, left side: Secondary | ICD-10-CM

## 2016-09-05 DIAGNOSIS — Z803 Family history of malignant neoplasm of breast: Secondary | ICD-10-CM | POA: Diagnosis not present

## 2016-09-05 DIAGNOSIS — D630 Anemia in neoplastic disease: Secondary | ICD-10-CM | POA: Diagnosis present

## 2016-09-05 DIAGNOSIS — Z9221 Personal history of antineoplastic chemotherapy: Secondary | ICD-10-CM

## 2016-09-05 DIAGNOSIS — N179 Acute kidney failure, unspecified: Secondary | ICD-10-CM | POA: Diagnosis present

## 2016-09-05 DIAGNOSIS — Z800 Family history of malignant neoplasm of digestive organs: Secondary | ICD-10-CM | POA: Diagnosis not present

## 2016-09-05 DIAGNOSIS — Z923 Personal history of irradiation: Secondary | ICD-10-CM | POA: Diagnosis not present

## 2016-09-05 DIAGNOSIS — E860 Dehydration: Secondary | ICD-10-CM | POA: Diagnosis present

## 2016-09-05 DIAGNOSIS — Z90710 Acquired absence of both cervix and uterus: Secondary | ICD-10-CM

## 2016-09-05 DIAGNOSIS — E876 Hypokalemia: Secondary | ICD-10-CM | POA: Diagnosis not present

## 2016-09-05 DIAGNOSIS — C50919 Malignant neoplasm of unspecified site of unspecified female breast: Secondary | ICD-10-CM

## 2016-09-05 DIAGNOSIS — M8448XS Pathological fracture, other site, sequela: Secondary | ICD-10-CM

## 2016-09-05 DIAGNOSIS — K8050 Calculus of bile duct without cholangitis or cholecystitis without obstruction: Secondary | ICD-10-CM

## 2016-09-05 DIAGNOSIS — K5903 Drug induced constipation: Secondary | ICD-10-CM | POA: Diagnosis not present

## 2016-09-05 DIAGNOSIS — C773 Secondary and unspecified malignant neoplasm of axilla and upper limb lymph nodes: Secondary | ICD-10-CM | POA: Diagnosis not present

## 2016-09-05 DIAGNOSIS — Z9079 Acquired absence of other genital organ(s): Secondary | ICD-10-CM

## 2016-09-05 DIAGNOSIS — G893 Neoplasm related pain (acute) (chronic): Secondary | ICD-10-CM | POA: Diagnosis present

## 2016-09-05 LAB — COMPREHENSIVE METABOLIC PANEL
ALK PHOS: 116 U/L (ref 38–126)
ALT: 14 U/L (ref 14–54)
AST: 35 U/L (ref 15–41)
Albumin: 4.1 g/dL (ref 3.5–5.0)
Anion gap: 9 (ref 5–15)
BUN: 42 mg/dL — AB (ref 6–20)
CALCIUM: 14.5 mg/dL — AB (ref 8.9–10.3)
CHLORIDE: 96 mmol/L — AB (ref 101–111)
CO2: 37 mmol/L — ABNORMAL HIGH (ref 22–32)
CREATININE: 3.68 mg/dL — AB (ref 0.44–1.00)
GFR, EST AFRICAN AMERICAN: 16 mL/min — AB (ref >60)
GFR, EST NON AFRICAN AMERICAN: 14 mL/min — AB (ref >60)
Glucose, Bld: 122 mg/dL — ABNORMAL HIGH (ref 65–99)
Potassium: 2.9 mmol/L — ABNORMAL LOW (ref 3.5–5.1)
Sodium: 142 mmol/L (ref 135–145)
Total Bilirubin: 0.6 mg/dL (ref 0.3–1.2)
Total Protein: 7.3 g/dL (ref 6.5–8.1)

## 2016-09-05 LAB — CBC WITH DIFFERENTIAL/PLATELET
Basophils Absolute: 0 10*3/uL (ref 0.0–0.1)
Basophils Relative: 0 %
Eosinophils Absolute: 0 10*3/uL (ref 0.0–0.7)
Eosinophils Relative: 0 %
HEMATOCRIT: 25.4 % — AB (ref 36.0–46.0)
HEMOGLOBIN: 8.5 g/dL — AB (ref 12.0–15.0)
LYMPHS PCT: 25 %
Lymphs Abs: 1.2 10*3/uL (ref 0.7–4.0)
MCH: 30.4 pg (ref 26.0–34.0)
MCHC: 33.5 g/dL (ref 30.0–36.0)
MCV: 90.7 fL (ref 78.0–100.0)
Monocytes Absolute: 0.3 10*3/uL (ref 0.1–1.0)
Monocytes Relative: 6 %
NEUTROS ABS: 3.2 10*3/uL (ref 1.7–7.7)
NEUTROS PCT: 69 %
PLATELETS: 124 10*3/uL — AB (ref 150–400)
RBC: 2.8 MIL/uL — AB (ref 3.87–5.11)
RDW: 14.7 % (ref 11.5–15.5)
WBC: 4.7 10*3/uL (ref 4.0–10.5)

## 2016-09-05 LAB — URINALYSIS, ROUTINE W REFLEX MICROSCOPIC
BILIRUBIN URINE: NEGATIVE
GLUCOSE, UA: NEGATIVE mg/dL
HGB URINE DIPSTICK: NEGATIVE
Ketones, ur: NEGATIVE mg/dL
Leukocytes, UA: NEGATIVE
Nitrite: NEGATIVE
PROTEIN: NEGATIVE mg/dL
SPECIFIC GRAVITY, URINE: 1.009 (ref 1.005–1.030)
pH: 7 (ref 5.0–8.0)

## 2016-09-05 LAB — POC OCCULT BLOOD, ED: Fecal Occult Bld: NEGATIVE

## 2016-09-05 LAB — D-DIMER, QUANTITATIVE (NOT AT ARMC): D DIMER QUANT: 7.57 ug{FEU}/mL — AB (ref 0.00–0.50)

## 2016-09-05 MED ORDER — ONDANSETRON HCL 4 MG PO TABS
4.0000 mg | ORAL_TABLET | Freq: Four times a day (QID) | ORAL | Status: DC | PRN
  Administered 2016-09-09 – 2016-09-14 (×2): 4 mg via ORAL
  Filled 2016-09-05 (×3): qty 1

## 2016-09-05 MED ORDER — TECHNETIUM TO 99M ALBUMIN AGGREGATED
3.9400 | Freq: Once | INTRAVENOUS | Status: AC | PRN
  Administered 2016-09-05: 3.94 via INTRAVENOUS

## 2016-09-05 MED ORDER — ONDANSETRON 4 MG PO TBDP
4.0000 mg | ORAL_TABLET | Freq: Once | ORAL | Status: AC
  Administered 2016-09-05: 4 mg via ORAL
  Filled 2016-09-05: qty 1

## 2016-09-05 MED ORDER — HYDROMORPHONE HCL 1 MG/ML IJ SOLN: 0.5000 mg | Freq: Once | INTRAMUSCULAR | Status: DC

## 2016-09-05 MED ORDER — SODIUM CHLORIDE 0.9 % IV SOLN
INTRAVENOUS | Status: DC
Start: 2016-09-05 — End: 2016-09-06
  Administered 2016-09-06 (×2): via INTRAVENOUS

## 2016-09-05 MED ORDER — HYDROMORPHONE HCL 2 MG PO TABS: 0.5000 mg | ORAL_TABLET | Freq: Once | ORAL | Status: DC

## 2016-09-05 MED ORDER — OXYCODONE-ACETAMINOPHEN 5-325 MG PO TABS
2.0000 | ORAL_TABLET | Freq: Once | ORAL | Status: AC
  Administered 2016-09-05: 2 via ORAL
  Filled 2016-09-05: qty 2

## 2016-09-05 MED ORDER — TECHNETIUM TC 99M DIETHYLENETRIAME-PENTAACETIC ACID
32.0000 | Freq: Once | INTRAVENOUS | Status: AC | PRN
  Administered 2016-09-05: 32 via RESPIRATORY_TRACT

## 2016-09-05 MED ORDER — MORPHINE SULFATE (PF) 2 MG/ML IV SOLN: 1.0000 mg | INTRAVENOUS | Status: DC | PRN

## 2016-09-05 MED ORDER — GADOBENATE DIMEGLUMINE 529 MG/ML IV SOLN
10.0000 mL | Freq: Once | INTRAVENOUS | Status: AC | PRN
  Administered 2016-09-05: 10 mL via INTRAVENOUS

## 2016-09-05 MED ORDER — HYDRALAZINE HCL 20 MG/ML IJ SOLN
10.0000 mg | INTRAMUSCULAR | Status: DC | PRN
  Filled 2016-09-05: qty 0.5

## 2016-09-05 MED ORDER — SODIUM CHLORIDE 0.9 % IV SOLN
60.0000 mg | Freq: Once | INTRAVENOUS | Status: AC
  Administered 2016-09-05: 60 mg via INTRAVENOUS
  Filled 2016-09-05: qty 10

## 2016-09-05 MED ORDER — SODIUM CHLORIDE 0.9 % IV BOLUS (SEPSIS)
1000.0000 mL | Freq: Once | INTRAVENOUS | Status: AC
  Administered 2016-09-05: 1000 mL via INTRAVENOUS

## 2016-09-05 MED ORDER — ONDANSETRON HCL 4 MG/2ML IJ SOLN: 4.0000 mg | Freq: Once | INTRAMUSCULAR | Status: DC

## 2016-09-05 MED ORDER — POTASSIUM CHLORIDE CRYS ER 20 MEQ PO TBCR
60.0000 meq | EXTENDED_RELEASE_TABLET | Freq: Once | ORAL | Status: AC
  Administered 2016-09-05: 60 meq via ORAL
  Filled 2016-09-05: qty 3

## 2016-09-05 MED ORDER — ACETAMINOPHEN 325 MG PO TABS
650.0000 mg | ORAL_TABLET | Freq: Four times a day (QID) | ORAL | Status: DC | PRN
  Administered 2016-09-06 – 2016-09-09 (×3): 650 mg via ORAL
  Filled 2016-09-05 (×3): qty 2

## 2016-09-05 MED ORDER — HEPARIN SODIUM (PORCINE) 5000 UNIT/ML IJ SOLN
5000.0000 [IU] | Freq: Three times a day (TID) | INTRAMUSCULAR | Status: DC
  Administered 2016-09-06 – 2016-09-14 (×22): 5000 [IU] via SUBCUTANEOUS
  Filled 2016-09-05 (×25): qty 1

## 2016-09-05 MED ORDER — ONDANSETRON HCL 4 MG/2ML IJ SOLN
4.0000 mg | Freq: Four times a day (QID) | INTRAMUSCULAR | Status: DC | PRN
  Administered 2016-09-08 – 2016-09-13 (×6): 4 mg via INTRAVENOUS
  Filled 2016-09-05 (×9): qty 2

## 2016-09-05 MED ORDER — ACETAMINOPHEN 650 MG RE SUPP
650.0000 mg | Freq: Four times a day (QID) | RECTAL | Status: DC | PRN
Start: 2016-09-05 — End: 2016-09-14

## 2016-09-05 NOTE — Telephone Encounter (Signed)
Pt called she is not feeling good, she is vomiting. Pt called again and stated she can hardly walk. She was wanting to know if she needs to go to ER or how to stay out of ER. No answer when called back. No answer on 2nd call back.  Noted she does have MRI of thoracic and lumbar scheduled for 8/30. Lab and MD on 9/17 MRI brain on 10/11  1350 noted she was admitted to ED

## 2016-09-05 NOTE — H&P (Signed)
History and Physical    Carolyn [name] MWU:[id number] DOB: [birth year] DOA: 09/05/2016  PCP: [name], MD  Patient coming from: Home.  Chief Complaint: Low back pain.  HPI: Carolyn [name] is a 46 y.o. female with with history of metastatic breast cancer presents to the ER with complaints of low back pain radiating to her left lower extremity. Patient states she gets this pain mostly after getting her fulvestrant shots. Denies any incontinence of urine or feces. Patient states over the last few days patient has benign poor appetite with nausea vomiting. Denies any diarrhea.   ED Course: In the ER patient had MRI of the T and L-spine which shows progressive metastasis. On exam patient is able to move her extremities. Labs revealed severe hypercalcemia with calcium around 14.5 and creatinine has worsened to 3.6 from almost normal last month. EKG shows normal sinus rhythm with QTC of 383 ms and QRS of 83 ms. Patient was given 2 L normal saline bolus in the ER for hypercalcemia in addition patient also has hypo-kalemia.  Review of Systems: As per HPI, rest all negative.   Past Medical History:  Diagnosis Date  . 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 History of chemotherapy 06/27/11 -09/04/11   neoadjuvant  . Hx of radiation therapy 11/08/11 -12/26/11   right chest wall/supraclav fossa, right scar  . S/P radiation therapy 05/08/14   SRS brain    Past Surgical History:  Procedure Laterality Date  . brain surgery-LITT Left 11/20/14   LITT procedure for Lt brain met.  Marland Kitchen BREAST BIOPSY     Left  . CHOLECYSTECTOMY N/A 03/01/2014   Procedure: LAPAROSCOPIC CHOLECYSTECTOMY;  Surgeon: Leighton Ruff, MD;  Location: [facility];  Service: General;  Laterality: N/A;  . LAMINECTOMY N/A 04/13/2014   Procedure: THORACIC LAMINECTOMY WITH FIXATION THORACIC SIX-THORACIC TEN FUSION;  Surgeon: Kristeen Miss, MD;  Location: [place] [facility];  Service: Neurosurgery;   Laterality: N/A;  . MODIFIED MASTECTOMY  10/03/2011   Procedure: MODIFIED MASTECTOMY;  Surgeon: Haywood Lasso, MD;  Location: [place];  Service: General;  Laterality: Right;  . PORT-A-CATH REMOVAL  01/30/2012   Procedure: MINOR REMOVAL PORT-A-CATH;  Surgeon: Haywood Lasso, MD;  Location: [place];  Service: General;  Laterality: Left;  . PORTACATH PLACEMENT  06/20/2011   Procedure: INSERTION PORT-A-CATH;  Surgeon: Haywood Lasso, MD;  Location: [place];  Service: General;  Laterality: N/A;  . ROBOTIC ASSISTED TOTAL HYSTERECTOMY WITH BILATERAL SALPINGO OOPHERECTOMY Bilateral 12/23/2012   Procedure: ROBOTIC ASSISTED TOTAL HYSTERECTOMY WITH BILATERAL SALPINGO OOPHORECTOMY;  Surgeon: Marvene Staff, MD;  Location: [facility];  Service: Gynecology;  Laterality: Bilateral;  . WISDOM TOOTH EXTRACTION       reports that she has never smoked. She has never used smokeless tobacco. She reports that she does not drink alcohol or use drugs.  Allergies  Allergen Reactions  . Allegra [Fexofenadine] Hives    Abdomen only  . Cat Hair Extract Other (See Comments)  . Shellfish Allergy Hives    Abdomen only  . Glucosamine Rash    Family History  Problem Relation Age of Onset  . Breast cancer Maternal Aunt 15  . Pancreatic cancer Maternal Grandfather   . Pancreatic cancer Maternal Aunt        died in her 28s  . Leukemia Maternal Aunt        died in her 52s  .  Ovarian cancer Cousin 57       maternal cousin    Prior to Admission medications   Medication Sig Start Date End Date Taking? Authorizing Provider  Calcium Carbonate-Vit D-Min (CALCIUM 1200 PO) Take 1 tablet by mouth 2 (two) times daily.   Yes [provider]  Cholecalciferol (VITAMIN D3) 2000 UNITS TABS Take 2,000 Units by mouth daily.   Yes [provider]  IBRANCE 75 MG capsule TAKE 1 CAPSULE BY MOUTH EVERY OTHER DAY FOR 21 DAYS THEN OFF A WEEK 05/05/16  Yes Magrinat, Virgie Dad, MD  ibuprofen (ADVIL,MOTRIN) 800 MG tablet Take 1 tablet (800 mg total) by mouth every 8 (eight) hours as needed. Patient taking differently: Take 800 mg by mouth every 8 (eight) hours as needed for moderate pain.  08/28/16  Yes Magrinat, Virgie Dad, MD  Multiple Vitamin (MULTIVITAMIN) tablet Take 1 tablet by mouth daily.   Yes [provider]  traMADol (ULTRAM) 50 MG tablet Take 1 tablet (50 mg total) by mouth every 6 (six) hours as needed. 08/28/16  Yes Magrinat, Virgie Dad, MD    Physical Exam: Vitals:   09/05/16 1630 09/05/16 1730 09/05/16 2000 09/05/16 2030  BP: (!) 141/95 140/75 (!) 154/76 (!) 151/83  Pulse: 92 91 93 84  Resp: 18 (!) 21    Temp:      TempSrc:      SpO2: 96% (!) 89% 91% 92%      Constitutional: Moderately built and nourished. Vitals:   09/05/16 1630 09/05/16 1730 09/05/16 2000 09/05/16 2030  BP: (!) 141/95 140/75 (!) 154/76 (!) 151/83  Pulse: 92 91 93 84  Resp: 18 (!) 21    Temp:      TempSrc:      SpO2: 96% (!) 89% 91% 92%   Eyes: Anicteric no pallor. ENMT: No discharge from the ears eyes nose or mouth. Neck: No mass felt. No neck rigidity. Respiratory: No rhonchi or crepitations. Cardiovascular: S1-S2 heard no murmurs appreciated. Abdomen: Soft nontender bowel sounds present. Musculoskeletal: No edema. No joint effusion. Skin: No rash. Neurologic: Alert awake oriented to time place and person. Moves all extremities. Psychiatric: Appears normal.   Labs on Admission: I have personally reviewed following labs and imaging studies  CBC:  Recent Labs Lab 09/05/16 1644  WBC 4.7  NEUTROABS 3.2  HGB 8.5*  HCT 25.4*  MCV 90.7  PLT 017*   Basic Metabolic Panel:  Recent Labs Lab 09/05/16 1644  NA 142  K 2.9*  CL 96*  CO2 37*  GLUCOSE 122*  BUN 42*  CREATININE 3.68*  CALCIUM 14.5*   GFR: Estimated Creatinine Clearance: 18 mL/min (A) (by C-G formula based on SCr of 3.68 mg/dL (H)). Liver Function Tests:  Recent Labs Lab  09/05/16 1644  AST 35  ALT 14  ALKPHOS 116  BILITOT 0.6  PROT 7.3  ALBUMIN 4.1   No results for input(s): LIPASE, AMYLASE in the last 168 hours. No results for input(s): AMMONIA in the last 168 hours. Coagulation Profile: No results for input(s): INR, PROTIME in the last 168 hours. Cardiac Enzymes: No results for input(s): CKTOTAL, CKMB, CKMBINDEX, TROPONINI in the last 168 hours. BNP (last 3 results) No results for input(s): PROBNP in the last 8760 hours. HbA1C: No results for input(s): HGBA1C in the last 72 hours. CBG: No results for input(s): GLUCAP in the last 168 hours. Lipid Profile: No results for input(s): CHOL, HDL, LDLCALC, TRIG, CHOLHDL, LDLDIRECT in the last 72 hours. Thyroid Function Tests:  No results for input(s): TSH, T4TOTAL, FREET4, T3FREE, THYROIDAB in the last 72 hours. Anemia Panel: No results for input(s): VITAMINB12, FOLATE, FERRITIN, TIBC, IRON, RETICCTPCT in the last 72 hours. Urine analysis:    Component Value Date/Time   COLORURINE STRAW (A) 09/05/2016 2000   APPEARANCEUR HAZY (A) 09/05/2016 2000   LABSPEC 1.009 09/05/2016 2000   LABSPEC 1.015 07/15/2014 1000   PHURINE 7.0 09/05/2016 2000   GLUCOSEU NEGATIVE 09/05/2016 2000   GLUCOSEU Negative 07/15/2014 1000   HGBUR NEGATIVE 09/05/2016 2000   BILIRUBINUR NEGATIVE 09/05/2016 2000   BILIRUBINUR Negative 07/15/2014 1000   KETONESUR NEGATIVE 09/05/2016 2000   PROTEINUR NEGATIVE 09/05/2016 2000   UROBILINOGEN 0.2 07/15/2014 1000   NITRITE NEGATIVE 09/05/2016 2000   LEUKOCYTESUR NEGATIVE 09/05/2016 2000   LEUKOCYTESUR Small 07/15/2014 1000   Sepsis Labs: _0 (procalcitonin:4,lacticidven:4) )No results found for this or any previous visit (from the past 240 hour(s)).   Radiological Exams on Admission: Dg Chest 2 View  Result Date: 09/05/2016 CLINICAL DATA:  Lumbar pain radiating to the legs. EXAM: CHEST  2 VIEW COMPARISON:  June 17, 2014 FINDINGS: There is fullness of the left suprahilar  region. The heart size normal. There is no focal infiltrate, pulmonary edema, or pleural effusion P Spinal rods are noted unchanged. Prior cholecystectomy clips are noted. IMPRESSION: Fullness of left suprahilar region. Further evaluation with a chest CT on outpatient basis is recommended to exclude mass. Electronically Signed   By: Abelardo Diesel M.D.   On: 09/05/2016 19:48   Mr Thoracic Spine W Wo Contrast  Result Date: 09/05/2016 CLINICAL DATA:  46 y/o  F; 329, image 5 EXAM: MRI THORACIC AND LUMBAR SPINE WITHOUT AND WITH CONTRAST TECHNIQUE: Multiplanar and multiecho pulse sequences of the thoracic and lumbar spine were obtained without and with intravenous contrast. CONTRAST:  22m MULTIHANCE GADOBENATE DIMEGLUMINE 529 MG/ML IV SOLN COMPARISON:  04/06/2016, 01/14/2016, 05/03/2015 bone scan. 04/12/2014 lumbar and thoracic spine MRI FINDINGS: MRI THORACIC SPINE FINDINGS Alignment:  Physiologic. Vertebrae: T6-T10 posterior instrumented fusion and T8 laminectomy. Susceptibility artifact from the fusion hardware partially obscures the vertebral bodies at those levels. Stable moderate compression deformity of the T8 vertebral body. Vertebral body heights are otherwise well-preserved. There is heterogeneous bone marrow signal within the T7, T8, T9, and T12 vertebral bodies corresponding to prior enhancing lesions without definite enhancement on the current study, probably representing treated metastasis. Mild enhancement within the posterior T11 and posterior T12 vertebral bodies consistent with residual metastasis. Additionally, there is enhancement within the T8 vertebral body greater on the right and extending into the pedicle compatible with residual metastasis. Cord: No abnormal cord signal or enhancement. The spinal cord compression. No epidural disease identified. Paraspinal and other soft tissues: Subcentimeter pulmonary nodules in the right lung base (series 9, image 34) and indeterminate T2 hyperintense  lesions in the right lobe of liver (series 9, image 47). Disc levels: No significant disc displacement or canal stenosis. MRI LUMBAR SPINE FINDINGS Segmentation:  Standard. Alignment:  Physiologic. Vertebrae: The diffusely heterogeneous lumbar spine and pelvis bone marrow signal with enhancement compatible with osseous metastatic disease. Metastatic disease is most pronounced at the T5 vertebral body with a for epidural disease effaces the anterior thecal space and enters the bilateral neural foramen greater on the left. No loss of vertebral body height. Conus medullaris: Extends to the L1-2 level and appears normal. No abnormal enhancement. Paraspinal and other soft tissues: Negative. Disc levels: No significant disc displacement. Mild lower lumbar facet hypertrophy. Anterior epidural disease arising  from the L5 vertebral body mildly narrows the spinal canal, effaces the lateral recesses with contact upon descending bilateral S1 nerve roots, moderately narrows the left and mildly narrows the right neural foramen. IMPRESSION: MRI thoracic spine: 1. Diffuse osseous metastasis with residual enhancing disease visualized at the T8, T11, and T12 vertebral body levels. No epidural disease. 2. Stable moderate compression deformity of T8 vertebral body. No new loss of vertebral body height. 3. Subcentimeter pulmonary nodules and indeterminate liver lesions may represent metastasis. MRI lumbar spine: 1. Diffuse osseous metastasis of lumbar spine and visible pelvis. 2. No loss of vertebral body height. 3. Anterior epidural disease at T5 results in moderate canal stenosis, lateral recess effacement with contact upon descending S1 nerve roots, and moderate left/mild right neural foraminal stenosis. Electronically Signed   By: Kristine Garbe M.D.   On: 09/05/2016 19:42   Mr Lumbar Spine W Wo Contrast  Result Date: 09/05/2016 CLINICAL DATA:  46 y/o  F; 329, image 5 EXAM: MRI THORACIC AND LUMBAR SPINE WITHOUT AND  WITH CONTRAST TECHNIQUE: Multiplanar and multiecho pulse sequences of the thoracic and lumbar spine were obtained without and with intravenous contrast. CONTRAST:  88m MULTIHANCE GADOBENATE DIMEGLUMINE 529 MG/ML IV SOLN COMPARISON:  04/06/2016, 01/14/2016, 05/03/2015 bone scan. 04/12/2014 lumbar and thoracic spine MRI FINDINGS: MRI THORACIC SPINE FINDINGS Alignment:  Physiologic. Vertebrae: T6-T10 posterior instrumented fusion and T8 laminectomy. Susceptibility artifact from the fusion hardware partially obscures the vertebral bodies at those levels. Stable moderate compression deformity of the T8 vertebral body. Vertebral body heights are otherwise well-preserved. There is heterogeneous bone marrow signal within the T7, T8, T9, and T12 vertebral bodies corresponding to prior enhancing lesions without definite enhancement on the current study, probably representing treated metastasis. Mild enhancement within the posterior T11 and posterior T12 vertebral bodies consistent with residual metastasis. Additionally, there is enhancement within the T8 vertebral body greater on the right and extending into the pedicle compatible with residual metastasis. Cord: No abnormal cord signal or enhancement. The spinal cord compression. No epidural disease identified. Paraspinal and other soft tissues: Subcentimeter pulmonary nodules in the right lung base (series 9, image 34) and indeterminate T2 hyperintense lesions in the right lobe of liver (series 9, image 47). Disc levels: No significant disc displacement or canal stenosis. MRI LUMBAR SPINE FINDINGS Segmentation:  Standard. Alignment:  Physiologic. Vertebrae: The diffusely heterogeneous lumbar spine and pelvis bone marrow signal with enhancement compatible with osseous metastatic disease. Metastatic disease is most pronounced at the T5 vertebral body with a for epidural disease effaces the anterior thecal space and enters the bilateral neural foramen greater on the left. No  loss of vertebral body height. Conus medullaris: Extends to the L1-2 level and appears normal. No abnormal enhancement. Paraspinal and other soft tissues: Negative. Disc levels: No significant disc displacement. Mild lower lumbar facet hypertrophy. Anterior epidural disease arising from the L5 vertebral body mildly narrows the spinal canal, effaces the lateral recesses with contact upon descending bilateral S1 nerve roots, moderately narrows the left and mildly narrows the right neural foramen. IMPRESSION: MRI thoracic spine: 1. Diffuse osseous metastasis with residual enhancing disease visualized at the T8, T11, and T12 vertebral body levels. No epidural disease. 2. Stable moderate compression deformity of T8 vertebral body. No new loss of vertebral body height. 3. Subcentimeter pulmonary nodules and indeterminate liver lesions may represent metastasis. MRI lumbar spine: 1. Diffuse osseous metastasis of lumbar spine and visible pelvis. 2. No loss of vertebral body height. 3. Anterior epidural disease at  T5 results in moderate canal stenosis, lateral recess effacement with contact upon descending S1 nerve roots, and moderate left/mild right neural foraminal stenosis. Electronically Signed   By: Kristine Garbe M.D.   On: 09/05/2016 19:42   Nm Pulmonary Vent And Perf (v/q Scan)  Result Date: 09/05/2016 CLINICAL DATA:  Lumbar pain, positive D-dimer EXAM: NUCLEAR MEDICINE VENTILATION - PERFUSION LUNG SCAN TECHNIQUE: Ventilation images were obtained in multiple projections using inhaled aerosol Tc-59mDTPA. Perfusion images were obtained in multiple projections after intravenous injection of Tc-972mAA. RADIOPHARMACEUTICALS:  32 mCi Technetium-9971mPA aerosol inhalation and 3.94 mCi Technetium-74m59m IV COMPARISON:  Chest x-ray 09/05/2016 FINDINGS: Ventilation: Heterogenous ventilation within the upper lobes. Central airways clumping of activity. Perfusion: Heterogenous perfusion. Multiple small  relatively peripheral filling defects within the upper lobes similar in distribution to heterogeneous ventilation. IMPRESSION: Heterogenous profusion with multiple small matched defects in the upper lobes. Overall low probability for pulmonary embolus. Electronically Signed   By: Kim Donavan Foil.   On: 09/05/2016 22:03    EKG: Independently reviewed. Normal sinus rhythm with QTC of 383 ms and QRS of 83 ms.  Assessment/Plan Principal Problem:   Hypercalcemia Active Problems:   Metastatic cancer to spine (HCC)   Breast cancer [facility]as[facility] to brain [facility]   ARF (acute renal failure) (HCC)[place] 1. Severe hypercalcemia - likely related to metastatic cancer. We'll hold off her calcium supplements and vitamin D supplements. Patient has received 2 L normal saline bolus and I have continue normal saline at 150 mL/h. I have ordered pamidronate after discussing with pharmacist 60 mg IV 1 dose now. Follow metabolic panel closely. 2. Low back pain likely related to patient's metastatic cancer and also receiving injections - may discuss with Dr. MagrJana Hakimtient's oncologist in a.m. 3. Hypokalemia likely could be from poor oral intake - replace and recheck. 4. Acute renal failure - cause not clear could be prerenal from poor oral intake. Check urine FENa, renal sonogram. Continue with hydration. Discontinue NSAIDs. 5. Anemia likely from malignancy related - follow CBC.  I have reviewed patient's old charts and labs.   DVT prophylaxis: Lovenox. Code Status: Full code.  Family Communication: Patient's sister.  Disposition Plan: To be determined.  Consults called: None.  Admission status: Inpatient.    KAKRRise PatienceTriad Hospitalists Pager [phone] 7PM-7AM, please contact night-coverage [email or web address] Password [email or web address]28/2018, 11:04 PM

## 2016-09-05 NOTE — ED Notes (Signed)
PT still at MRI

## 2016-09-05 NOTE — ED Provider Notes (Signed)
[facility] DEPT Provider Note   CSN: [id number] Arrival date & time: 09/05/16  1332     History   Chief Complaint Chief Complaint  Patient presents with  . Back Pain  . 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 Patient reports pain has been present for 2 weeks but she feels it was worse today the pain was so bad she was having difficulty walking. Patient was prescribed ibuprofen and tramadol by her oncologist she reports that these did not help with the pain, patient is not currently on chronic narcotics for cancer pain management. Patient denies any numbness, weakness, or loss of bowel or bladder control. Patietn reports she walks with a cane usually and has been able to walk, just with increased pain. Patient also complains of nausea and vomiting that started this weekend she reports this is primarily after eating small meals denies current nausea. Patient denies unusual headaches, visual changes, pain elsewhere in the beck, or shortness of breath. Patient reports one episode of sharp chest pains a few weeks ago, this resolved on its own and she has not had any repeat episodes of chest pain. Patient is followed by Dr. [name].      Past Medical History:  Diagnosis Date  . Breast cancer ([place]) 09/2011   invasive ductal carcinoma metastatic ca in 3/14 lymph nodes  . History of chemotherapy 06/27/11 -09/04/11   neoadjuvant  . Hx of radiation therapy 11/08/11 -12/26/11   right chest wall/supraclav fossa, right scar  . S/P radiation therapy 05/08/14   SRS brain    Patient Active Problem List   Diagnosis Date Noted  . Bone metastases ([place]) 01/17/2016  . Genetic testing 10/15/2014  . Hypokalemia 06/17/2014  . Breast cancer metastasized to brain ([place])   . Metastatic cancer to spine ([facility])  04/20/2014  . Malnutrition of moderate degree ([place]) 04/13/2014  . Myelopathy ([place]) 04/12/2014  . Pathologic fracture of thoracic vertebrae 04/12/2014  . Biliary colic 60/67/7034  . Malignant neoplasm of upper-outer quadrant of right breast in female, estrogen receptor positive ([place]) 09/24/2013  . S/P total hysterectomy and bilateral salpingo-oophorectomy 12/23/2012    Past Surgical History:  Procedure Laterality Date  . brain surgery-LITT Left 11/20/14   LITT procedure for Lt brain met.  Marland Kitchen BREAST BIOPSY     Left  . CHOLECYSTECTOMY N/A 03/01/2014   Procedure: LAPAROSCOPIC CHOLECYSTECTOMY;  Surgeon: Leighton Ruff, MD;  Location: [facility];  Service: General;  Laterality: N/A;  . LAMINECTOMY N/A 04/13/2014   Procedure: THORACIC LAMINECTOMY WITH FIXATION THORACIC SIX-THORACIC TEN FUSION;  Surgeon: Kristeen Miss, MD;  Location: [facility];  Service: Neurosurgery;  Laterality: N/A;  . MODIFIED MASTECTOMY  10/03/2011   Procedure: MODIFIED MASTECTOMY;  Surgeon: Haywood Lasso, MD;  Location[facility];  Service: General;  Laterality: Right;  . PORT-A-CATH REMOVAL  01/30/2012   Procedure: MINOR REMOVAL PORT-A-CATH;  Surgeon: Haywood Lasso, MD;  Location: [facility];  Service: General;  Laterality: Left;  . PORTACATH PLACEMENT  06/20/2011   Procedure: INSERTION PORT-A-CATH;  Surgeon: Haywood Lasso, MD;  Location: [facility];  Service: General;  Laterality: N/A;  . ROBOTIC ASSISTED TOTAL HYSTERECTOMY WITH BILATERAL SALPINGO OOPHERECTOMY Bilateral 12/23/2012   Procedure: ROBOTIC ASSISTED TOTAL HYSTERECTOMY WITH BILATERAL SALPINGO OOPHORECTOMY;  Surgeon: Marvene Staff, MD;  Location: [facility];  Service: Gynecology;  Laterality: Bilateral;  . WISDOM TOOTH EXTRACTION      OB History    No data available       Home Medications    Prior to Admission medications   Medication Sig Start Date End Date Taking? Authorizing Provider  Calcium Carbonate-Vit D-Min  (CALCIUM 1200 PO) Take 1 tablet by mouth 2 (two) times daily.   Yes [provider]  Cholecalciferol (VITAMIN D3) 2000 UNITS TABS Take 2,000 Units by mouth daily.   Yes [provider]  IBRANCE 75 MG capsule TAKE 1 CAPSULE BY MOUTH EVERY OTHER DAY FOR 21 DAYS THEN OFF A WEEK 05/05/16  Yes Magrinat, Virgie Dad, MD  ibuprofen (ADVIL,MOTRIN) 800 MG tablet Take 1 tablet (800 mg total) by mouth every 8 (eight) hours as needed. Patient taking differently: Take 800 mg by mouth every 8 (eight) hours as needed for moderate pain.  08/28/16  Yes Magrinat, Virgie Dad, MD  Multiple Vitamin (MULTIVITAMIN) tablet Take 1 tablet by mouth daily.   Yes [provider]  traMADol (ULTRAM) 50 MG tablet Take 1 tablet (50 mg total) by mouth every 6 (six) hours as needed. 08/28/16  Yes Magrinat, Virgie Dad, MD  acetaminophen (TYLENOL) 325 MG tablet Take 2 tablets (650 mg total) by mouth every 6 (six) hours as needed for mild pain, moderate pain, fever or headache. Patient not taking: Reported on 09/05/2016 03/02/14   Earnstine Regal, PA-C  anastrozole (ARIMIDEX) 1 MG tablet Take 1 tablet (1 mg total) by mouth daily. Patient not taking: Reported on 09/05/2016 08/01/16   Magrinat, Virgie Dad, MD    Family History Family History  Problem Relation Age of Onset  . Breast cancer Maternal Aunt 67  . Pancreatic cancer Maternal Grandfather   . Pancreatic cancer Maternal Aunt        died in her 105s  . Leukemia Maternal Aunt        died in her 66s  . Ovarian cancer Cousin 71       maternal cousin    Social History Social History  Substance Use Topics  . Smoking status: Never Smoker  . Smokeless tobacco: Never Used  . Alcohol use No     Allergies   Allegra [fexofenadine]; Cat hair extract; Shellfish allergy; and Glucosamine   Review of Systems Review of Systems  Constitutional: Negative for chills and fatigue.  HENT: Negative for mouth sores, rhinorrhea and sore throat.   Eyes: Negative for  photophobia and visual disturbance.  Respiratory: Negative for cough, chest tightness and shortness of breath.   Cardiovascular: Negative for chest pain and leg swelling.  Gastrointestinal: Positive for nausea and vomiting. Negative for abdominal distention and abdominal pain.  Genitourinary: Negative for difficulty urinating and dysuria.  Musculoskeletal: Positive for back pain.  Skin: Negative for pallor, rash and wound.  Neurological: Negative for dizziness, syncope, weakness, numbness and headaches.     Physical Exam Updated Vital Signs BP (!) 144/79   Pulse 93   Temp 98.5 F (36.9 C) (Oral)   Resp 18   LMP 07/05/2011   SpO2 94%   Physical Exam  Constitutional: She appears well-developed and well-nourished. No distress.  HENT:  Head: Normocephalic and atraumatic.  Eyes: Pupils are equal, round, and reactive to light. EOM are normal. Right eye exhibits no discharge. Left eye exhibits no discharge.  Cardiovascular: Normal rate and regular rhythm.   Murmur heard. Systolic murmur 3/6  Pulmonary/Chest:  Effort normal and breath sounds normal. No respiratory distress. She has no wheezes. She has no rales.  Abdominal: Soft. Bowel sounds are normal. She exhibits no distension. There is no tenderness. There is no guarding.  Musculoskeletal: She exhibits no edema or deformity.  Pain with movement  Neurological: She is alert. Coordination normal.  Speech is clear, able to follow commands CN III-XII intact Normal strength in upper and lower extremities bilaterally including dorsiflexion and plantar flexion, strong and equal grip strength Sensation normal to light and sharp touch Moves extremities without ataxia, coordination intact  Skin: Skin is warm and dry. Capillary refill takes less than 2 seconds. She is not diaphoretic.  Psychiatric: She has a normal mood and affect. Her behavior is normal.  Nursing note and vitals reviewed.    ED Treatments / Results  Labs (all labs  ordered are listed, but only abnormal results are displayed) Labs Reviewed  CBC WITH DIFFERENTIAL/PLATELET - Abnormal; Notable for the following:       Result Value   RBC 2.80 (*)    Hemoglobin 8.5 (*)    HCT 25.4 (*)    Platelets 124 (*)    All other components within normal limits  COMPREHENSIVE METABOLIC PANEL - Abnormal; Notable for the following:    Potassium 2.9 (*)    Chloride 96 (*)    CO2 37 (*)    Glucose, Bld 122 (*)    BUN 42 (*)    Creatinine, Ser 3.68 (*)    Calcium 14.5 (*)    GFR calc non Af Amer 14 (*)    GFR calc Af Amer 16 (*)    All other components within normal limits  D-DIMER, QUANTITATIVE (NOT AT [facility]) - Abnormal; Notable for the following:    D-Dimer, Quant 7.57 (*)    All other components within normal limits  URINALYSIS, ROUTINE W REFLEX MICROSCOPIC - Abnormal; Notable for the following:    Color, Urine STRAW (*)    APPearance HAZY (*)    All other components within normal limits  OCCULT BLOOD X 1 CARD TO LAB, STOOL  POC OCCULT BLOOD, ED    EKG  EKG Interpretation  Date/Time:  Tuesday September 05 2016 17:19:13 EDT Ventricular Rate:  91 PR Interval:    QRS Duration: 83 QT Interval:  311 QTC Calculation: 383 R Axis:   83 Text Interpretation:  Sinus rhythm LAE, consider biatrial enlargement Borderline abnrm T, anterolateral leads Confirmed by Merrily Pew [phone]) on 09/05/2016 7:58:15 PM       Radiology Dg Chest 2 View  Result Date: 09/05/2016 CLINICAL DATA:  Lumbar pain radiating to the legs. EXAM: CHEST  2 VIEW COMPARISON:  June 17, 2014 FINDINGS: There is fullness of the left suprahilar region. The heart size normal. There is no focal infiltrate, pulmonary edema, or pleural effusion P Spinal rods are noted unchanged. Prior cholecystectomy clips are noted. IMPRESSION: Fullness of left suprahilar region. Further evaluation with a chest CT on outpatient basis is recommended to exclude mass. Electronically Signed   By: Abelardo Diesel M.D.   On:  09/05/2016 19:48   Mr Thoracic Spine W Wo Contrast  Result Date: 09/05/2016 CLINICAL DATA:  46 y/o  F; 329, image 5 EXAM: MRI THORACIC AND LUMBAR SPINE WITHOUT AND WITH CONTRAST TECHNIQUE: Multiplanar and multiecho pulse sequences of the thoracic and lumbar spine were obtained without and with intravenous contrast. CONTRAST:  98m MULTIHANCE GADOBENATE DIMEGLUMINE 529 MG/ML IV SOLN COMPARISON:  04/06/2016, 01/14/2016, 05/03/2015 bone scan. 04/12/2014  lumbar and thoracic spine MRI FINDINGS: MRI THORACIC SPINE FINDINGS Alignment:  Physiologic. Vertebrae: T6-T10 posterior instrumented fusion and T8 laminectomy. Susceptibility artifact from the fusion hardware partially obscures the vertebral bodies at those levels. Stable moderate compression deformity of the T8 vertebral body. Vertebral body heights are otherwise well-preserved. There is heterogeneous bone marrow signal within the T7, T8, T9, and T12 vertebral bodies corresponding to prior enhancing lesions without definite enhancement on the current study, probably representing treated metastasis. Mild enhancement within the posterior T11 and posterior T12 vertebral bodies consistent with residual metastasis. Additionally, there is enhancement within the T8 vertebral body greater on the right and extending into the pedicle compatible with residual metastasis. Cord: No abnormal cord signal or enhancement. The spinal cord compression. No epidural disease identified. Paraspinal and other soft tissues: Subcentimeter pulmonary nodules in the right lung base (series 9, image 34) and indeterminate T2 hyperintense lesions in the right lobe of liver (series 9, image 47). Disc levels: No significant disc displacement or canal stenosis. MRI LUMBAR SPINE FINDINGS Segmentation:  Standard. Alignment:  Physiologic. Vertebrae: The diffusely heterogeneous lumbar spine and pelvis bone marrow signal with enhancement compatible with osseous metastatic disease. Metastatic disease is  most pronounced at the T5 vertebral body with a for epidural disease effaces the anterior thecal space and enters the bilateral neural foramen greater on the left. No loss of vertebral body height. Conus medullaris: Extends to the L1-2 level and appears normal. No abnormal enhancement. Paraspinal and other soft tissues: Negative. Disc levels: No significant disc displacement. Mild lower lumbar facet hypertrophy. Anterior epidural disease arising from the L5 vertebral body mildly narrows the spinal canal, effaces the lateral recesses with contact upon descending bilateral S1 nerve roots, moderately narrows the left and mildly narrows the right neural foramen. IMPRESSION: MRI thoracic spine: 1. Diffuse osseous metastasis with residual enhancing disease visualized at the T8, T11, and T12 vertebral body levels. No epidural disease. 2. Stable moderate compression deformity of T8 vertebral body. No new loss of vertebral body height. 3. Subcentimeter pulmonary nodules and indeterminate liver lesions may represent metastasis. MRI lumbar spine: 1. Diffuse osseous metastasis of lumbar spine and visible pelvis. 2. No loss of vertebral body height. 3. Anterior epidural disease at T5 results in moderate canal stenosis, lateral recess effacement with contact upon descending S1 nerve roots, and moderate left/mild right neural foraminal stenosis. Electronically Signed   By: Kristine Garbe M.D.   On: 09/05/2016 19:42   Mr Lumbar Spine W Wo Contrast  Result Date: 09/05/2016 CLINICAL DATA:  46 y/o  F; 329, image 5 EXAM: MRI THORACIC AND LUMBAR SPINE WITHOUT AND WITH CONTRAST TECHNIQUE: Multiplanar and multiecho pulse sequences of the thoracic and lumbar spine were obtained without and with intravenous contrast. CONTRAST:  21m MULTIHANCE GADOBENATE DIMEGLUMINE 529 MG/ML IV SOLN COMPARISON:  04/06/2016, 01/14/2016, 05/03/2015 bone scan. 04/12/2014 lumbar and thoracic spine MRI FINDINGS: MRI THORACIC SPINE FINDINGS  Alignment:  Physiologic. Vertebrae: T6-T10 posterior instrumented fusion and T8 laminectomy. Susceptibility artifact from the fusion hardware partially obscures the vertebral bodies at those levels. Stable moderate compression deformity of the T8 vertebral body. Vertebral body heights are otherwise well-preserved. There is heterogeneous bone marrow signal within the T7, T8, T9, and T12 vertebral bodies corresponding to prior enhancing lesions without definite enhancement on the current study, probably representing treated metastasis. Mild enhancement within the posterior T11 and posterior T12 vertebral bodies consistent with residual metastasis. Additionally, there is enhancement within the T8 vertebral body greater on the right and  extending into the pedicle compatible with residual metastasis. Cord: No abnormal cord signal or enhancement. The spinal cord compression. No epidural disease identified. Paraspinal and other soft tissues: Subcentimeter pulmonary nodules in the right lung base (series 9, image 34) and indeterminate T2 hyperintense lesions in the right lobe of liver (series 9, image 47). Disc levels: No significant disc displacement or canal stenosis. MRI LUMBAR SPINE FINDINGS Segmentation:  Standard. Alignment:  Physiologic. Vertebrae: The diffusely heterogeneous lumbar spine and pelvis bone marrow signal with enhancement compatible with osseous metastatic disease. Metastatic disease is most pronounced at the T5 vertebral body with a for epidural disease effaces the anterior thecal space and enters the bilateral neural foramen greater on the left. No loss of vertebral body height. Conus medullaris: Extends to the L1-2 level and appears normal. No abnormal enhancement. Paraspinal and other soft tissues: Negative. Disc levels: No significant disc displacement. Mild lower lumbar facet hypertrophy. Anterior epidural disease arising from the L5 vertebral body mildly narrows the spinal canal, effaces the  lateral recesses with contact upon descending bilateral S1 nerve roots, moderately narrows the left and mildly narrows the right neural foramen. IMPRESSION: MRI thoracic spine: 1. Diffuse osseous metastasis with residual enhancing disease visualized at the T8, T11, and T12 vertebral body levels. No epidural disease. 2. Stable moderate compression deformity of T8 vertebral body. No new loss of vertebral body height. 3. Subcentimeter pulmonary nodules and indeterminate liver lesions may represent metastasis. MRI lumbar spine: 1. Diffuse osseous metastasis of lumbar spine and visible pelvis. 2. No loss of vertebral body height. 3. Anterior epidural disease at T5 results in moderate canal stenosis, lateral recess effacement with contact upon descending S1 nerve roots, and moderate left/mild right neural foraminal stenosis. Electronically Signed   By: Kristine Garbe M.D.   On: 09/05/2016 19:42   Nm Pulmonary Vent And Perf (v/q Scan)  Result Date: 09/05/2016 CLINICAL DATA:  Lumbar pain, positive D-dimer EXAM: NUCLEAR MEDICINE VENTILATION - PERFUSION LUNG SCAN TECHNIQUE: Ventilation images were obtained in multiple projections using inhaled aerosol Tc-39mDTPA. Perfusion images were obtained in multiple projections after intravenous injection of Tc-913mAA. RADIOPHARMACEUTICALS:  32 mCi Technetium-9971mPA aerosol inhalation and 3.94 mCi Technetium-4m56m IV COMPARISON:  Chest x-ray 09/05/2016 FINDINGS: Ventilation: Heterogenous ventilation within the upper lobes. Central airways clumping of activity. Perfusion: Heterogenous perfusion. Multiple small relatively peripheral filling defects within the upper lobes similar in distribution to heterogeneous ventilation. IMPRESSION: Heterogenous profusion with multiple small matched defects in the upper lobes. Overall low probability for pulmonary embolus. Electronically Signed   By: Kim Donavan Foil.   On: 09/05/2016 22:03    Procedures Procedures (including  critical care time)  Medications Ordered in ED Medications  sodium chloride 0.9 % bolus 1,000 mL (not administered)  ondansetron (ZOFRAN-ODT) disintegrating tablet 4 mg (4 mg Oral Given 09/05/16 1639)  oxyCODONE-acetaminophen (PERCOCET/ROXICET) 5-325 MG per tablet 2 tablet (2 tablets Oral Given 09/05/16 1639)  sodium chloride 0.9 % bolus 1,000 mL (1,000 mLs Intravenous New Bag/Given 09/05/16 1944)  potassium chloride SA (K-DUR,KLOR-CON) CR tablet 60 mEq (60 mEq Oral Given 09/05/16 1943)  gadobenate dimeglumine (MULTIHANCE) injection 10 mL (10 mLs Intravenous Contrast Given 09/05/16 1900)  technetium TC 31M diethylenetriame-pentaacetic acid (DTPA) injection 32 millicurie (32 millicuries Inhalation Given 09/05/16 2051)  technetium albumin aggregated (MAA) injection solution 3.949.51licurie (3.948.84licuries Intravenous Contrast Given 09/05/16 2125)     Initial Impression / Assessment and Plan / ED Course  I have reviewed the triage vital signs and the  nursing notes.  Pertinent labs & imaging results that were available during my care of the patient were reviewed by me and considered in my medical decision making (see chart for details).  Presentation consistent with sciatica. No weakness or numbness of lower extremities, no loss of bowel/bladder control. Given history of vertebral metastasis without recent imaging of the lumbar spine patient feeling like today pain has worsened will go ahead and obtain MR images, patient was scheduled to have these done this coming Thursday.   Patient did report sharp chest pains a few weeks ago and she is transiently tachycardic and hypoxic in the ED today, denies any current shortness of breath, will obtain chest x-ray and d-dimer as patient is currently low risk per the Well[facility] criteria. Patient received fentanyl by EMS with some improvement in pain will provide Percocet here in the ED.  Calcium is 14.5 today, was 12.9 eight days ago, creatinine has also increased  to 3.68 and potassium is currently 2.9. Will give fluid bolus, and replace potassium. D-dimer is positive due to current AKI patient is not a candidate for CTPA will obtain VQ scan.  MRI of Thoracic and Lumbar spine shows diffuse osseous metastasis, no evidence of cord compression. On repeat exam patient has increased mobility after pain management with Percocet.   V/Q scan shows low probability of pulmonary embolism. After fluids and pain management tachycardia has improved patient is still transiently hypoxic to 91%. Continues to deny current chest pain or shortness of breath.   Patient will need to be admitted for continued management of her AKI and electrolyte abnormalities.    Patient discussed with Dr. Dayna Barker, who saw patient as well and agrees with plan.   Final Clinical Impressions(s) / ED Diagnoses   Final diagnoses:  Chest pain  Sciatica of left side  Low back pain with left-sided sciatica, unspecified back pain laterality, unspecified chronicity  AKI (acute kidney injury) ([place])  Hypokalemia  Hypercalcemia    New Prescriptions New Prescriptions   No medications on file     Janet Berlin 09/06/16 0114    Merrily Pew, MD 09/06/16 1754

## 2016-09-05 NOTE — ED Notes (Signed)
Bed: [facility] Expected date:  Expected time:  Means of arrival:  Comments: 

## 2016-09-05 NOTE — ED Notes (Signed)
Bed: WA01 Expected date:  Expected time:  Means of arrival:  Comments: Hold for [employer]

## 2016-09-05 NOTE — ED Provider Notes (Signed)
Medical screening examination/treatment/procedure(s) were conducted as a shared visit with non-physician practitioner(s) and myself.  I personally evaluated the patient during the encounter.  46 year old female who has a history of metastatic breast cancer to bone/brain and multiple organs presents immersed from today with a primary complaint of left leg pain. Review of systems she has multiple complaints to include chest pain leg pain nausea but no shortness of breath. On exam she has sats as low as 91% heart rate as high as 99 with a respiratory rate 18-20 range. Her lungs are clear she does have a heart murmur which she does not know that she has. It seems systolic but it does sound like it's new. She has normal neuro function of her lower extremity however she feels like it's weak when she walks. The pain seems to be an L5 or L4 distribution however that has not been imaged recently and she has scans scheduled for 2 days from now. Patient is low to intermediate risk for a pulmonary embolus secondary to bowel sign abnormalities of fleeting chest pain she's had in the past and a history of cancer so will do a d-dimer. Patient also with worsening neurologic symptoms that could be consistent with sciatica and an acute compression from possible metastasis or other spinal abnormality so we will do MRIs as well. We'll treat pain in the meantime.  Workup was relatively unremarkable as far as her back pain and leg pain go. She does have worsening metastasis but no nerve compression or cord compression. She she was found to haveanemia, hypokalemia worsening renal functio         wor s e ning renal function where her kidney function has a creatinine of 3.6 which is up from approximately one month ago when it was less than 1. Also with hypercalcemia so was given fluids in ER.   No ecg changes. I will be for admission for further workup for renal failure and other lab abnormalities per   EKG  Interpretation  Date/Time:  Tuesday September 05 2016 17:19:13 EDT Ventricular Rate:  91 PR Interval:    QRS Duration: 83 QT Interval:  311 QTC Calculation: 383 R Axis:   83 Text Interpretation:  Sinus rhythm LAE, consider biatrial enlargement Borderline abnrm T, anterolateral leads Confirmed by Merrily Pew [phone]) on 09/05/2016 7:58:15 PM Also confirmed by Merrily Pew [phone]), editor Philomena Doheny [phone])  on 09/06/2016 7:25:13 AM         Adriene Padula, Corene Cornea, MD 09/06/16 1750

## 2016-09-05 NOTE — ED Triage Notes (Signed)
Per EMS, pt complains of pain in lumbar spine radiating to legs. Pt denies injury. Pt has hx of bone cancer. Pt given 117mcg fentanyl. Pain went from 9/10 to 5/10 after receiving fentanyl.   HR 110 BP 190/90

## 2016-09-06 ENCOUNTER — Inpatient Hospital Stay (HOSPITAL_COMMUNITY): Payer: BC Managed Care – PPO

## 2016-09-06 DIAGNOSIS — C50411 Malignant neoplasm of upper-outer quadrant of right female breast: Secondary | ICD-10-CM

## 2016-09-06 DIAGNOSIS — Z170 Estrogen receptor positive status [ER+]: Secondary | ICD-10-CM

## 2016-09-06 DIAGNOSIS — C7951 Secondary malignant neoplasm of bone: Secondary | ICD-10-CM

## 2016-09-06 DIAGNOSIS — C773 Secondary and unspecified malignant neoplasm of axilla and upper limb lymph nodes: Secondary | ICD-10-CM

## 2016-09-06 DIAGNOSIS — G893 Neoplasm related pain (acute) (chronic): Secondary | ICD-10-CM

## 2016-09-06 LAB — BASIC METABOLIC PANEL
Anion gap: 7 (ref 5–15)
BUN: 35 mg/dL — AB (ref 6–20)
CALCIUM: 12.4 mg/dL — AB (ref 8.9–10.3)
CO2: 30 mmol/L (ref 22–32)
CREATININE: 3.12 mg/dL — AB (ref 0.44–1.00)
Chloride: 107 mmol/L (ref 101–111)
GFR calc Af Amer: 19 mL/min — ABNORMAL LOW (ref >60)
GFR, EST NON AFRICAN AMERICAN: 17 mL/min — AB (ref >60)
GLUCOSE: 90 mg/dL (ref 65–99)
POTASSIUM: 4 mmol/L (ref 3.5–5.1)
SODIUM: 144 mmol/L (ref 135–145)

## 2016-09-06 LAB — CBC
HCT: 21.7 % — ABNORMAL LOW (ref 36.0–46.0)
HEMATOCRIT: 20.8 % — AB (ref 36.0–46.0)
HEMOGLOBIN: 7.4 g/dL — AB (ref 12.0–15.0)
Hemoglobin: 7 g/dL — ABNORMAL LOW (ref 12.0–15.0)
MCH: 30.2 pg (ref 26.0–34.0)
MCH: 31 pg (ref 26.0–34.0)
MCHC: 33.7 g/dL (ref 30.0–36.0)
MCHC: 34.1 g/dL (ref 30.0–36.0)
MCV: 89.7 fL (ref 78.0–100.0)
MCV: 90.8 fL (ref 78.0–100.0)
PLATELETS: 113 10*3/uL — AB (ref 150–400)
PLATELETS: 93 10*3/uL — AB (ref 150–400)
RBC: 2.32 MIL/uL — ABNORMAL LOW (ref 3.87–5.11)
RBC: 2.39 MIL/uL — ABNORMAL LOW (ref 3.87–5.11)
RDW: 14.6 % (ref 11.5–15.5)
RDW: 14.6 % (ref 11.5–15.5)
WBC: 3.5 10*3/uL — AB (ref 4.0–10.5)
WBC: 4.3 10*3/uL (ref 4.0–10.5)

## 2016-09-06 LAB — CREATININE, SERUM
CREATININE: 3.61 mg/dL — AB (ref 0.44–1.00)
GFR, EST AFRICAN AMERICAN: 16 mL/min — AB (ref >60)
GFR, EST NON AFRICAN AMERICAN: 14 mL/min — AB (ref >60)

## 2016-09-06 LAB — URIC ACID: URIC ACID, SERUM: 8.6 mg/dL — AB (ref 2.3–6.6)

## 2016-09-06 LAB — PREPARE RBC (CROSSMATCH)

## 2016-09-06 LAB — HIV ANTIBODY (ROUTINE TESTING W REFLEX): HIV SCREEN 4TH GENERATION: NONREACTIVE

## 2016-09-06 LAB — ABO/RH: ABO/RH(D): B POS

## 2016-09-06 MED ORDER — ORAL CARE MOUTH RINSE
15.0000 mL | Freq: Two times a day (BID) | OROMUCOSAL | Status: DC
  Administered 2016-09-06 – 2016-09-14 (×14): 15 mL via OROMUCOSAL

## 2016-09-06 MED ORDER — SODIUM CHLORIDE 4 MEQ/ML IV SOLN: INTRAVENOUS | Status: DC

## 2016-09-06 MED ORDER — DOCUSATE SODIUM 100 MG PO CAPS
200.0000 mg | ORAL_CAPSULE | Freq: Every day | ORAL | Status: DC
  Administered 2016-09-06 – 2016-09-14 (×8): 200 mg via ORAL
  Filled 2016-09-06 (×9): qty 2

## 2016-09-06 MED ORDER — CALCITONIN (SALMON) 200 UNIT/ACT NA SOLN: 1.0000 | Freq: Every day | NASAL | Status: DC

## 2016-09-06 MED ORDER — FUROSEMIDE 10 MG/ML IJ SOLN
60.0000 mg | Freq: Once | INTRAMUSCULAR | Status: AC
  Administered 2016-09-06: 60 mg via INTRAVENOUS
  Filled 2016-09-06: qty 6

## 2016-09-06 MED ORDER — POLYETHYLENE GLYCOL 3350 17 G PO PACK
17.0000 g | PACK | Freq: Two times a day (BID) | ORAL | Status: DC
  Administered 2016-09-06 – 2016-09-07 (×3): 17 g via ORAL
  Filled 2016-09-06 (×3): qty 1

## 2016-09-06 MED ORDER — SODIUM CHLORIDE 0.9 % IV SOLN
Freq: Once | INTRAVENOUS | Status: AC
  Administered 2016-09-06: 16:00:00 via INTRAVENOUS

## 2016-09-06 MED ORDER — STERILE WATER FOR INJECTION IV SOLN
INTRAVENOUS | Status: AC
  Administered 2016-09-06 – 2016-09-07 (×4): via INTRAVENOUS
  Filled 2016-09-06 (×4): qty 9.71

## 2016-09-06 MED ORDER — OXYCODONE-ACETAMINOPHEN 5-325 MG PO TABS
1.0000 | ORAL_TABLET | Freq: Four times a day (QID) | ORAL | Status: DC | PRN
  Administered 2016-09-06 – 2016-09-08 (×5): 1 via ORAL
  Administered 2016-09-13: 2 via ORAL
  Filled 2016-09-06 (×2): qty 1
  Filled 2016-09-06: qty 2
  Filled 2016-09-06 (×3): qty 1

## 2016-09-06 NOTE — ED Notes (Signed)
Report given to Jessica RN

## 2016-09-06 NOTE — Progress Notes (Signed)
Please call Janett Billow at [phone] at [phone] to give report. Andre Lefort

## 2016-09-06 NOTE — Progress Notes (Signed)
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 female with with history of metastatic breast cancer presents to the ER with complaints of low back pain radiating to her left lower extremity. Patient states she gets this pain mostly after getting her fulvestrant shots. Denies any incontinence of urine or feces. Patient states over the last few days patient has benign poor appetite with nausea vomiting. Denies any diarrhea.   ED Course: In the ER patient had MRI of the T and L-spine which shows progressive metastasis. On exam patient is able to move her extremities. Labs revealed severe hypercalcemia with calcium around 14.5 and creatinine has worsened to 3.6 from almost normal last month. EKG shows normal sinus rhythm with QTC of 383 ms and QRS of 83 ms. Patient was given 2 L normal saline bolus in the ER for hypercalcemia in addition patient also has hypo-kalemia.  Assessment and plan 1. Severe hypercalcemia - likely related to metastatic cancer. We'll hold off her calcium supplements and vitamin D supplements. Patient has received 2 L normal saline bolus and I have continue normal saline with bicarb  at 150 mL/h. Given   pamidronate   60 mg IV 1 dose now. Also give 1 dose of Lasix IV 2. Low back pain likely related to patient's metastatic cancer and also receiving injections - may discuss with Dr. [name], patient's oncologist in a.m. 3. Hypokalemia likely could be from poor oral intake - replace and recheck. 4. Acute renal failure - cause not clear could be prerenal from poor oral intake. Likely from elevated calcium, uric acid. Continue IV hydration, NSAIDs discontinued. 5. Anemia likely from  malignancy related - follow CBC. Hemoglobin 7.0 will need 1 unit of packed red blood cells today   DVT prophylaxsis heparin  Code Status:  Full code  Family Communication: Discussed in detail with the patient, all imaging results, lab results explained to the patient   Disposition Plan:  As above       Consultants:  None  Procedures:  None  Antibiotics: Anti-infectives    None         HPI/Subjective: Left hip pain , denies numbness and tingling  Objective: Vitals:   09/06/16 1126 09/06/16 1127 09/06/16 1127 09/06/16 1151  BP: 117/90  117/90 (!) 148/80  Pulse: 92 94 (!) 106 (!) 117  Resp: 19 17 18 18   Temp:    98.4 F (36.9 C)  TempSrc:    Oral  SpO2: 100% 100%  94%  Weight:    59 kg (130 lb 1.1 oz)    Intake/Output Summary (Last 24 hours) at 09/06/16 1243 Last data filed at 09/06/16 0105  Gross per 24 hour  Intake             2000 ml  Output                0 ml  Net             2000 ml    Exam:  Examination:  General exam: Appears calm and comfortable  Respiratory system: Clear to auscultation. Respiratory effort normal. Cardiovascular system: S1 & S2 heard, RRR. No JVD,  murmurs, rubs, gallops or clicks. No pedal edema. Gastrointestinal system: Abdomen is nondistended, soft and nontender. No organomegaly or masses felt. Normal bowel sounds heard. Central nervous system: Alert and oriented. No focal neurological deficits. Extremities: Symmetric 5 x 5 power. Skin: No rashes, lesions or ulcers Psychiatry: Judgement and insight appear normal. Mood & affect appropriate.     Data Reviewed: I have personally reviewed following labs and imaging studies  Micro Results No results found for this or any previous visit (from the past 240 hour(s)).  Radiology Reports Dg Chest 2 View  Result Date: 09/05/2016 CLINICAL DATA:  Lumbar pain radiating to the legs. EXAM: CHEST  2 VIEW COMPARISON:  June 17, 2014 FINDINGS: There is fullness of the left suprahilar  region. The heart size normal. There is no focal infiltrate, pulmonary edema, or pleural effusion P Spinal rods are noted unchanged. Prior cholecystectomy clips are noted. IMPRESSION: Fullness of left suprahilar region. Further evaluation with a chest CT on outpatient basis is recommended to exclude mass. Electronically Signed   By: Abelardo Diesel M.D.   On: 09/05/2016 19:48   Mr Thoracic Spine W Wo Contrast  Result Date: 09/05/2016 CLINICAL DATA:  46 y/o  F; 329, image 5 EXAM: MRI THORACIC AND LUMBAR SPINE WITHOUT AND WITH CONTRAST TECHNIQUE: Multiplanar and multiecho pulse sequences of the thoracic and lumbar spine were obtained without and with intravenous contrast. CONTRAST:  32mL MULTIHANCE GADOBENATE DIMEGLUMINE 529 MG/ML IV SOLN COMPARISON:  04/06/2016, 01/14/2016, 05/03/2015 bone scan. 04/12/2014 lumbar and thoracic spine MRI FINDINGS: MRI THORACIC SPINE FINDINGS Alignment:  Physiologic. Vertebrae: T6-T10 posterior instrumented fusion and T8 laminectomy. Susceptibility artifact from the fusion hardware partially obscures the vertebral bodies at those levels. Stable moderate compression deformity of the T8 vertebral body. Vertebral body heights are otherwise well-preserved. There is heterogeneous bone marrow signal within the T7, T8, T9, and T12 vertebral bodies corresponding to prior enhancing lesions without definite enhancement on the current study, probably representing treated metastasis. Mild enhancement within the posterior T11 and posterior T12 vertebral bodies consistent with residual metastasis. Additionally, there is enhancement within the T8 vertebral body greater on the right and extending into the pedicle compatible with residual metastasis. Cord: No abnormal cord signal or enhancement. The spinal cord compression. No epidural disease identified. Paraspinal and other soft tissues: Subcentimeter pulmonary nodules in the right lung base (series 9, image 34) and indeterminate T2 hyperintense  lesions in the right lobe of liver (series 9, image 47). Disc levels: No significant disc displacement or canal stenosis. MRI LUMBAR SPINE FINDINGS Segmentation:  Standard. Alignment:  Physiologic. Vertebrae: The diffusely heterogeneous lumbar spine and pelvis bone marrow signal with enhancement compatible with osseous metastatic disease. Metastatic disease is most pronounced at the T5 vertebral body with a for epidural disease effaces the anterior thecal space and enters the bilateral neural foramen greater on the left. No loss of vertebral body height. Conus medullaris: Extends to the L1-2 level and appears normal. No abnormal enhancement. Paraspinal and other soft tissues: Negative. Disc levels: No significant disc displacement. Mild lower lumbar facet hypertrophy. Anterior epidural disease arising from the L5 vertebral body mildly narrows the spinal canal, effaces the lateral recesses with contact upon descending bilateral S1 nerve roots, moderately narrows the left and mildly narrows the right neural foramen. IMPRESSION: MRI thoracic spine: 1. Diffuse osseous metastasis with residual enhancing disease visualized at the T8, T11, and T12 vertebral body levels. No epidural disease. 2. Stable moderate compression deformity of T8 vertebral body. No new loss  of vertebral body height. 3. Subcentimeter pulmonary nodules and indeterminate liver lesions may represent metastasis. MRI lumbar spine: 1. Diffuse osseous metastasis of lumbar spine and visible pelvis. 2. No loss of vertebral body height. 3. Anterior epidural disease at T5 results in moderate canal stenosis, lateral recess effacement with contact upon descending S1 nerve roots, and moderate left/mild right neural foraminal stenosis. Electronically Signed   By: Kristine Garbe M.D.   On: 09/05/2016 19:42   Mr Lumbar Spine W Wo Contrast  Result Date: 09/05/2016 CLINICAL DATA:  46 y/o  F; 329, image 5 EXAM: MRI THORACIC AND LUMBAR SPINE WITHOUT AND  WITH CONTRAST TECHNIQUE: Multiplanar and multiecho pulse sequences of the thoracic and lumbar spine were obtained without and with intravenous contrast. CONTRAST:  37mL MULTIHANCE GADOBENATE DIMEGLUMINE 529 MG/ML IV SOLN COMPARISON:  04/06/2016, 01/14/2016, 05/03/2015 bone scan. 04/12/2014 lumbar and thoracic spine MRI FINDINGS: MRI THORACIC SPINE FINDINGS Alignment:  Physiologic. Vertebrae: T6-T10 posterior instrumented fusion and T8 laminectomy. Susceptibility artifact from the fusion hardware partially obscures the vertebral bodies at those levels. Stable moderate compression deformity of the T8 vertebral body. Vertebral body heights are otherwise well-preserved. There is heterogeneous bone marrow signal within the T7, T8, T9, and T12 vertebral bodies corresponding to prior enhancing lesions without definite enhancement on the current study, probably representing treated metastasis. Mild enhancement within the posterior T11 and posterior T12 vertebral bodies consistent with residual metastasis. Additionally, there is enhancement within the T8 vertebral body greater on the right and extending into the pedicle compatible with residual metastasis. Cord: No abnormal cord signal or enhancement. The spinal cord compression. No epidural disease identified. Paraspinal and other soft tissues: Subcentimeter pulmonary nodules in the right lung base (series 9, image 34) and indeterminate T2 hyperintense lesions in the right lobe of liver (series 9, image 47). Disc levels: No significant disc displacement or canal stenosis. MRI LUMBAR SPINE FINDINGS Segmentation:  Standard. Alignment:  Physiologic. Vertebrae: The diffusely heterogeneous lumbar spine and pelvis bone marrow signal with enhancement compatible with osseous metastatic disease. Metastatic disease is most pronounced at the T5 vertebral body with a for epidural disease effaces the anterior thecal space and enters the bilateral neural foramen greater on the left. No  loss of vertebral body height. Conus medullaris: Extends to the L1-2 level and appears normal. No abnormal enhancement. Paraspinal and other soft tissues: Negative. Disc levels: No significant disc displacement. Mild lower lumbar facet hypertrophy. Anterior epidural disease arising from the L5 vertebral body mildly narrows the spinal canal, effaces the lateral recesses with contact upon descending bilateral S1 nerve roots, moderately narrows the left and mildly narrows the right neural foramen. IMPRESSION: MRI thoracic spine: 1. Diffuse osseous metastasis with residual enhancing disease visualized at the T8, T11, and T12 vertebral body levels. No epidural disease. 2. Stable moderate compression deformity of T8 vertebral body. No new loss of vertebral body height. 3. Subcentimeter pulmonary nodules and indeterminate liver lesions may represent metastasis. MRI lumbar spine: 1. Diffuse osseous metastasis of lumbar spine and visible pelvis. 2. No loss of vertebral body height. 3. Anterior epidural disease at T5 results in moderate canal stenosis, lateral recess effacement with contact upon descending S1 nerve roots, and moderate left/mild right neural foraminal stenosis. Electronically Signed   By: Kristine Garbe M.D.   On: 09/05/2016 19:42   US Renal  Result Date: 09/06/2016 CLINICAL DATA:  Left-sided flank pain, acute renal failure EXAM: RENAL / URINARY TRACT ULTRASOUND COMPLETE COMPARISON:  12/16/2014 FINDINGS: Right Kidney: Length: 11.2 cm.  Echogenicity within normal limits. No mass or hydronephrosis visualized. Left Kidney: Length: 11.6 cm. Echogenicity within normal limits. No mass or hydronephrosis visualized. Bladder: Appears normal for degree of bladder distention. Note is made of a small left-sided pleural effusion. IMPRESSION: No acute renal abnormality is noted. Small left pleural effusion is noted. Electronically Signed   By: Inez Catalina M.D.   On: 09/06/2016 07:37   Nm Pulmonary Vent And  Perf (v/q Scan)  Result Date: 09/05/2016 CLINICAL DATA:  Lumbar pain, positive D-dimer EXAM: NUCLEAR MEDICINE VENTILATION - PERFUSION LUNG SCAN TECHNIQUE: Ventilation images were obtained in multiple projections using inhaled aerosol Tc-36m DTPA. Perfusion images were obtained in multiple projections after intravenous injection of Tc-62m MAA. RADIOPHARMACEUTICALS:  32 mCi Technetium-97m DTPA aerosol inhalation and 3.94 mCi Technetium-24m MAA IV COMPARISON:  Chest x-ray 09/05/2016 FINDINGS: Ventilation: Heterogenous ventilation within the upper lobes. Central airways clumping of activity. Perfusion: Heterogenous perfusion. Multiple small relatively peripheral filling defects within the upper lobes similar in distribution to heterogeneous ventilation. IMPRESSION: Heterogenous profusion with multiple small matched defects in the upper lobes. Overall low probability for pulmonary embolus. Electronically Signed   By: Donavan Foil M.D.   On: 09/05/2016 22:03     CBC  Recent Labs Lab 09/05/16 1644 09/06/16 0000 09/06/16 0542  WBC 4.7 4.3 3.5*  HGB 8.5* 7.4* 7.0*  HCT 25.4* 21.7* 20.8*  PLT 124* 113* 93*  MCV 90.7 90.8 89.7  MCH 30.4 31.0 30.2  MCHC 33.5 34.1 33.7  RDW 14.7 14.6 14.6  LYMPHSABS 1.2  --   --   MONOABS 0.3  --   --   EOSABS 0.0  --   --   BASOSABS 0.0  --   --     Chemistries   Recent Labs Lab 09/05/16 1644 09/06/16 0000 09/06/16 0542  NA 142  --  144  K 2.9*  --  4.0  CL 96*  --  107  CO2 37*  --  30  GLUCOSE 122*  --  90  BUN 42*  --  35*  CREATININE 3.68* 3.61* 3.12*  CALCIUM 14.5*  --  12.4*  AST 35  --   --   ALT 14  --   --   ALKPHOS 116  --   --   BILITOT 0.6  --   --    ------------------------------------------------------------------------------------------------------------------ estimated creatinine clearance is 20.8 mL/min (A) (by C-G formula based on SCr of 3.12 mg/dL  (H)). ------------------------------------------------------------------------------------------------------------------ No results for input(s): HGBA1C in the last 72 hours. ------------------------------------------------------------------------------------------------------------------ No results for input(s): CHOL, HDL, LDLCALC, TRIG, CHOLHDL, LDLDIRECT in the last 72 hours. ------------------------------------------------------------------------------------------------------------------ No results for input(s): TSH, T4TOTAL, T3FREE, THYROIDAB in the last 72 hours.  Invalid input(s): FREET3 ------------------------------------------------------------------------------------------------------------------ No results for input(s): VITAMINB12, FOLATE, FERRITIN, TIBC, IRON, RETICCTPCT in the last 72 hours.  Coagulation profile No results for input(s): INR, PROTIME in the last 168 hours.   Recent Labs  09/05/16 1644  DDIMER 7.57*    Cardiac Enzymes No results for input(s): CKMB, TROPONINI, MYOGLOBIN in the last 168 hours.  Invalid input(s): CK ------------------------------------------------------------------------------------------------------------------ Invalid input(s): POCBNP   CBG: No results for input(s): GLUCAP in the last 168 hours.     Studies: Dg Chest 2 View  Result Date: 09/05/2016 CLINICAL DATA:  Lumbar pain radiating to the legs. EXAM: CHEST  2 VIEW COMPARISON:  June 17, 2014 FINDINGS: There is fullness of the left suprahilar region. The heart size normal. There is no focal infiltrate, pulmonary edema, or pleural effusion  P Spinal rods are noted unchanged. Prior cholecystectomy clips are noted. IMPRESSION: Fullness of left suprahilar region. Further evaluation with a chest CT on outpatient basis is recommended to exclude mass. Electronically Signed   By: Abelardo Diesel M.D.   On: 09/05/2016 19:48   Mr Thoracic Spine W Wo Contrast  Result Date: 09/05/2016 CLINICAL  DATA:  46 y/o  F; 329, image 5 EXAM: MRI THORACIC AND LUMBAR SPINE WITHOUT AND WITH CONTRAST TECHNIQUE: Multiplanar and multiecho pulse sequences of the thoracic and lumbar spine were obtained without and with intravenous contrast. CONTRAST:  33mL MULTIHANCE GADOBENATE DIMEGLUMINE 529 MG/ML IV SOLN COMPARISON:  04/06/2016, 01/14/2016, 05/03/2015 bone scan. 04/12/2014 lumbar and thoracic spine MRI FINDINGS: MRI THORACIC SPINE FINDINGS Alignment:  Physiologic. Vertebrae: T6-T10 posterior instrumented fusion and T8 laminectomy. Susceptibility artifact from the fusion hardware partially obscures the vertebral bodies at those levels. Stable moderate compression deformity of the T8 vertebral body. Vertebral body heights are otherwise well-preserved. There is heterogeneous bone marrow signal within the T7, T8, T9, and T12 vertebral bodies corresponding to prior enhancing lesions without definite enhancement on the current study, probably representing treated metastasis. Mild enhancement within the posterior T11 and posterior T12 vertebral bodies consistent with residual metastasis. Additionally, there is enhancement within the T8 vertebral body greater on the right and extending into the pedicle compatible with residual metastasis. Cord: No abnormal cord signal or enhancement. The spinal cord compression. No epidural disease identified. Paraspinal and other soft tissues: Subcentimeter pulmonary nodules in the right lung base (series 9, image 34) and indeterminate T2 hyperintense lesions in the right lobe of liver (series 9, image 47). Disc levels: No significant disc displacement or canal stenosis. MRI LUMBAR SPINE FINDINGS Segmentation:  Standard. Alignment:  Physiologic. Vertebrae: The diffusely heterogeneous lumbar spine and pelvis bone marrow signal with enhancement compatible with osseous metastatic disease. Metastatic disease is most pronounced at the T5 vertebral body with a for epidural disease effaces the anterior  thecal space and enters the bilateral neural foramen greater on the left. No loss of vertebral body height. Conus medullaris: Extends to the L1-2 level and appears normal. No abnormal enhancement. Paraspinal and other soft tissues: Negative. Disc levels: No significant disc displacement. Mild lower lumbar facet hypertrophy. Anterior epidural disease arising from the L5 vertebral body mildly narrows the spinal canal, effaces the lateral recesses with contact upon descending bilateral S1 nerve roots, moderately narrows the left and mildly narrows the right neural foramen. IMPRESSION: MRI thoracic spine: 1. Diffuse osseous metastasis with residual enhancing disease visualized at the T8, T11, and T12 vertebral body levels. No epidural disease. 2. Stable moderate compression deformity of T8 vertebral body. No new loss of vertebral body height. 3. Subcentimeter pulmonary nodules and indeterminate liver lesions may represent metastasis. MRI lumbar spine: 1. Diffuse osseous metastasis of lumbar spine and visible pelvis. 2. No loss of vertebral body height. 3. Anterior epidural disease at T5 results in moderate canal stenosis, lateral recess effacement with contact upon descending S1 nerve roots, and moderate left/mild right neural foraminal stenosis. Electronically Signed   By: Kristine Garbe M.D.   On: 09/05/2016 19:42   Mr Lumbar Spine W Wo Contrast  Result Date: 09/05/2016 CLINICAL DATA:  46 y/o  F; 329, image 5 EXAM: MRI THORACIC AND LUMBAR SPINE WITHOUT AND WITH CONTRAST TECHNIQUE: Multiplanar and multiecho pulse sequences of the thoracic and lumbar spine were obtained without and with intravenous contrast. CONTRAST:  66mL MULTIHANCE GADOBENATE DIMEGLUMINE 529 MG/ML IV SOLN COMPARISON:  04/06/2016, 01/14/2016,  05/03/2015 bone scan. 04/12/2014 lumbar and thoracic spine MRI FINDINGS: MRI THORACIC SPINE FINDINGS Alignment:  Physiologic. Vertebrae: T6-T10 posterior instrumented fusion and T8 laminectomy.  Susceptibility artifact from the fusion hardware partially obscures the vertebral bodies at those levels. Stable moderate compression deformity of the T8 vertebral body. Vertebral body heights are otherwise well-preserved. There is heterogeneous bone marrow signal within the T7, T8, T9, and T12 vertebral bodies corresponding to prior enhancing lesions without definite enhancement on the current study, probably representing treated metastasis. Mild enhancement within the posterior T11 and posterior T12 vertebral bodies consistent with residual metastasis. Additionally, there is enhancement within the T8 vertebral body greater on the right and extending into the pedicle compatible with residual metastasis. Cord: No abnormal cord signal or enhancement. The spinal cord compression. No epidural disease identified. Paraspinal and other soft tissues: Subcentimeter pulmonary nodules in the right lung base (series 9, image 34) and indeterminate T2 hyperintense lesions in the right lobe of liver (series 9, image 47). Disc levels: No significant disc displacement or canal stenosis. MRI LUMBAR SPINE FINDINGS Segmentation:  Standard. Alignment:  Physiologic. Vertebrae: The diffusely heterogeneous lumbar spine and pelvis bone marrow signal with enhancement compatible with osseous metastatic disease. Metastatic disease is most pronounced at the T5 vertebral body with a for epidural disease effaces the anterior thecal space and enters the bilateral neural foramen greater on the left. No loss of vertebral body height. Conus medullaris: Extends to the L1-2 level and appears normal. No abnormal enhancement. Paraspinal and other soft tissues: Negative. Disc levels: No significant disc displacement. Mild lower lumbar facet hypertrophy. Anterior epidural disease arising from the L5 vertebral body mildly narrows the spinal canal, effaces the lateral recesses with contact upon descending bilateral S1 nerve roots, moderately narrows the  left and mildly narrows the right neural foramen. IMPRESSION: MRI thoracic spine: 1. Diffuse osseous metastasis with residual enhancing disease visualized at the T8, T11, and T12 vertebral body levels. No epidural disease. 2. Stable moderate compression deformity of T8 vertebral body. No new loss of vertebral body height. 3. Subcentimeter pulmonary nodules and indeterminate liver lesions may represent metastasis. MRI lumbar spine: 1. Diffuse osseous metastasis of lumbar spine and visible pelvis. 2. No loss of vertebral body height. 3. Anterior epidural disease at T5 results in moderate canal stenosis, lateral recess effacement with contact upon descending S1 nerve roots, and moderate left/mild right neural foraminal stenosis. Electronically Signed   By: Kristine Garbe M.D.   On: 09/05/2016 19:42   US Renal  Result Date: 09/06/2016 CLINICAL DATA:  Left-sided flank pain, acute renal failure EXAM: RENAL / URINARY TRACT ULTRASOUND COMPLETE COMPARISON:  12/16/2014 FINDINGS: Right Kidney: Length: 11.2 cm. Echogenicity within normal limits. No mass or hydronephrosis visualized. Left Kidney: Length: 11.6 cm. Echogenicity within normal limits. No mass or hydronephrosis visualized. Bladder: Appears normal for degree of bladder distention. Note is made of a small left-sided pleural effusion. IMPRESSION: No acute renal abnormality is noted. Small left pleural effusion is noted. Electronically Signed   By: Inez Catalina M.D.   On: 09/06/2016 07:37   Nm Pulmonary Vent And Perf (v/q Scan)  Result Date: 09/05/2016 CLINICAL DATA:  Lumbar pain, positive D-dimer EXAM: NUCLEAR MEDICINE VENTILATION - PERFUSION LUNG SCAN TECHNIQUE: Ventilation images were obtained in multiple projections using inhaled aerosol Tc-15m DTPA. Perfusion images were obtained in multiple projections after intravenous injection of Tc-49m MAA. RADIOPHARMACEUTICALS:  32 mCi Technetium-21m DTPA aerosol inhalation and 3.94 mCi Technetium-47m MAA IV  COMPARISON:  Chest x-ray 09/05/2016 FINDINGS:  Ventilation: Heterogenous ventilation within the upper lobes. Central airways clumping of activity. Perfusion: Heterogenous perfusion. Multiple small relatively peripheral filling defects within the upper lobes similar in distribution to heterogeneous ventilation. IMPRESSION: Heterogenous profusion with multiple small matched defects in the upper lobes. Overall low probability for pulmonary embolus. Electronically Signed   By: Donavan Foil M.D.   On: 09/05/2016 22:03      No results found for: HGBA1C Lab Results  Component Value Date   CREATININE 3.12 (H) 09/06/2016       Scheduled Meds: . calcitonin (sal[place])  1 spray Alternating Nares Daily  . furosemide  60 mg Intravenous Once  . heparin  5,000 Units Subcutaneous Q8H  . mouth rinse  15 mL Mouth Rinse BID   Continuous Infusions: . sodium chloride    .  sodium bicarbonate infusion 1/4 NS 1000 mL       LOS: 1 day    Time spent: >30 MINS    Reyne Dumas  [facility] Pager [phone]. If 7PM-7AM, please contact night-coverage at [email or web address], password [facility] 09/06/2016, 12:43 PM  LOS: 1 day

## 2016-09-06 NOTE — ED Notes (Signed)
Bed: RESA Expected date:  Expected time:  Means of arrival:  Comments: Hold for rm 1

## 2016-09-06 NOTE — Progress Notes (Signed)
Carolyn [name]   DOB:October 08, 1946   WP#:[id number]   NKN#:[id number]  Subjective:  Carolyn [name] was getting ready fro work yesterday when she felt "bad," became nauseated, and had increasing bony pain. This eventually took her to the ED where she was found to be hypercalcemic and hypokalemic, with her creatininedoubled from baseline. She was admitted, started onIVF and received pamidronate. MRI of the thoraco-lumbar spine shows multiple lesions but no epidural disease. There was a mention of possible small liver and lung metastases but c/w chest CT from March-- these are nonspecific and probably benign (unless there has been change-- she will need a restaging chest CT).  Currently her pain is better controlled. She is able to walk to the BR w assistance and says activity makes the pain better. Sje is mildly constipated from narcotics. Sister Carolyn [name] in room   Objective: middle aged African American woman examined in bed Vitals:   09/06/16 1547 09/06/16 1605  BP: 133/76 133/76  Pulse: 90 86  Resp: 18 18  Temp: 98.6 F (37 C) 98.3 F (36.8 C)  SpO2: 100% 100%    Body mass index is 19.78 kg/m.  Intake/Output Summary (Last 24 hours) at 09/06/16 1729 Last data filed at 09/06/16 0105  Gross per 24 hour  Intake             2000 ml  Output                0 ml  Net             2000 ml     Sclerae unicteric  Oropharynx shows no thrush or other lesions  No cervical or supraclavicular adenopathy  Lungs no rales or wheezes--auscultated anterolaterally  Heart regular rate and rhythm  Abdomen soft, +BS  Neuro nonfocal  Breast exam: deferred  CBG (last 3)  No results for input(s): GLUCAP in the last 72 hours.   Labs:  Lab Results  Component Value Date   WBC 3.5 (L) 09/06/2016   HGB 7.0 (L) 09/06/2016   HCT 20.8 (L) 09/06/2016   MCV 89.7 09/06/2016   PLT 93 (L) 09/06/2016   NEUTROABS 3.2 09/05/2016    _0 @  Urine Studies No results for input(s): UHGB, CRYS in the last 72  hours.  Invalid input(s): UACOL, UAPR, USPG, UPH, UTP, UGL, UKET, UBIL, UNIT, UROB, ULEU, UEPI, UWBC, URBC, UBAC, CAST, UCOM, BILUA  Basic Metabolic Panel:  Recent Labs Lab 09/05/16 1644 09/06/16 0000 09/06/16 0542  NA 142  --  144  K 2.9*  --  4.0  CL 96*  --  107  CO2 37*  --  30  GLUCOSE 122*  --  90  BUN 42*  --  35*  CREATININE 3.68* 3.61* 3.12*  CALCIUM 14.5*  --  12.4*   GFR Estimated Creatinine Clearance: 20.8 mL/min (A) (by C-G formula based on SCr of 3.12 mg/dL (H)). Liver Function Tests:  Recent Labs Lab 09/05/16 1644  AST 35  ALT 14  ALKPHOS 116  BILITOT 0.6  PROT 7.3  ALBUMIN 4.1   No results for input(s): LIPASE, AMYLASE in the last 168 hours. No results for input(s): AMMONIA in the last 168 hours. Coagulation profile No results for input(s): INR, PROTIME in the last 168 hours.  CBC:  Recent Labs Lab 09/05/16 1644 09/06/16 0000 09/06/16 0542  WBC 4.7 4.3 3.5*  NEUTROABS 3.2  --   --   HGB 8.5* 7.4* 7.0*  HCT 25.4* 21.7* 20.8*  MCV 90.7 90.8  89.7  PLT 124* 113* 93*   Cardiac Enzymes: No results for input(s): CKTOTAL, CKMB, CKMBINDEX, TROPONINI in the last 168 hours. BNP: Invalid input(s): POCBNP CBG: No results for input(s): GLUCAP in the last 168 hours. D-Dimer  Recent Labs  09/05/16 1644  DDIMER 7.57*   Hgb A1c No results for input(s): HGBA1C in the last 72 hours. Lipid Profile No results for input(s): CHOL, HDL, LDLCALC, TRIG, CHOLHDL, LDLDIRECT in the last 72 hours. Thyroid function studies No results for input(s): TSH, T4TOTAL, T3FREE, THYROIDAB in the last 72 hours.  Invalid input(s): FREET3 Anemia work up No results for input(s): VITAMINB12, FOLATE, FERRITIN, TIBC, IRON, RETICCTPCT in the last 72 hours. Microbiology No results found for this or any previous visit (from the past 240 hour(s)).    Studies:  Dg Chest 2 View  Result Date: 09/05/2016 CLINICAL DATA:  Lumbar pain radiating to the legs. EXAM: CHEST  2 VIEW  COMPARISON:  June 17, 2014 FINDINGS: There is fullness of the left suprahilar region. The heart size normal. There is no focal infiltrate, pulmonary edema, or pleural effusion P Spinal rods are noted unchanged. Prior cholecystectomy clips are noted. IMPRESSION: Fullness of left suprahilar region. Further evaluation with a chest CT on outpatient basis is recommended to exclude mass. Electronically Signed   By: Abelardo Diesel M.D.   On: 09/05/2016 19:48   Mr Thoracic Spine W Wo Contrast  Result Date: 09/05/2016 CLINICAL DATA:  46 y/o  F; 329, image 5 EXAM: MRI THORACIC AND LUMBAR SPINE WITHOUT AND WITH CONTRAST TECHNIQUE: Multiplanar and multiecho pulse sequences of the thoracic and lumbar spine were obtained without and with intravenous contrast. CONTRAST:  52m MULTIHANCE GADOBENATE DIMEGLUMINE 529 MG/ML IV SOLN COMPARISON:  04/06/2016, 01/14/2016, 05/03/2015 bone scan. 04/12/2014 lumbar and thoracic spine MRI FINDINGS: MRI THORACIC SPINE FINDINGS Alignment:  Physiologic. Vertebrae: T6-T10 posterior instrumented fusion and T8 laminectomy. Susceptibility artifact from the fusion hardware partially obscures the vertebral bodies at those levels. Stable moderate compression deformity of the T8 vertebral body. Vertebral body heights are otherwise well-preserved. There is heterogeneous bone marrow signal within the T7, T8, T9, and T12 vertebral bodies corresponding to prior enhancing lesions without definite enhancement on the current study, probably representing treated metastasis. Mild enhancement within the posterior T11 and posterior T12 vertebral bodies consistent with residual metastasis. Additionally, there is enhancement within the T8 vertebral body greater on the right and extending into the pedicle compatible with residual metastasis. Cord: No abnormal cord signal or enhancement. The spinal cord compression. No epidural disease identified. Paraspinal and other soft tissues: Subcentimeter pulmonary nodules in  the right lung base (series 9, image 34) and indeterminate T2 hyperintense lesions in the right lobe of liver (series 9, image 47). Disc levels: No significant disc displacement or canal stenosis. MRI LUMBAR SPINE FINDINGS Segmentation:  Standard. Alignment:  Physiologic. Vertebrae: The diffusely heterogeneous lumbar spine and pelvis bone marrow signal with enhancement compatible with osseous metastatic disease. Metastatic disease is most pronounced at the T5 vertebral body with a for epidural disease effaces the anterior thecal space and enters the bilateral neural foramen greater on the left. No loss of vertebral body height. Conus medullaris: Extends to the L1-2 level and appears normal. No abnormal enhancement. Paraspinal and other soft tissues: Negative. Disc levels: No significant disc displacement. Mild lower lumbar facet hypertrophy. Anterior epidural disease arising from the L5 vertebral body mildly narrows the spinal canal, effaces the lateral recesses with contact upon descending bilateral S1 nerve roots, moderately narrows the  left and mildly narrows the right neural foramen. IMPRESSION: MRI thoracic spine: 1. Diffuse osseous metastasis with residual enhancing disease visualized at the T8, T11, and T12 vertebral body levels. No epidural disease. 2. Stable moderate compression deformity of T8 vertebral body. No new loss of vertebral body height. 3. Subcentimeter pulmonary nodules and indeterminate liver lesions may represent metastasis. MRI lumbar spine: 1. Diffuse osseous metastasis of lumbar spine and visible pelvis. 2. No loss of vertebral body height. 3. Anterior epidural disease at T5 results in moderate canal stenosis, lateral recess effacement with contact upon descending S1 nerve roots, and moderate left/mild right neural foraminal stenosis. Electronically Signed   By: Kristine Garbe M.D.   On: 09/05/2016 19:42   Mr Lumbar Spine W Wo Contrast  Result Date: 09/05/2016 CLINICAL DATA:   46 y/o  F; 329, image 5 EXAM: MRI THORACIC AND LUMBAR SPINE WITHOUT AND WITH CONTRAST TECHNIQUE: Multiplanar and multiecho pulse sequences of the thoracic and lumbar spine were obtained without and with intravenous contrast. CONTRAST:  54m MULTIHANCE GADOBENATE DIMEGLUMINE 529 MG/ML IV SOLN COMPARISON:  04/06/2016, 01/14/2016, 05/03/2015 bone scan. 04/12/2014 lumbar and thoracic spine MRI FINDINGS: MRI THORACIC SPINE FINDINGS Alignment:  Physiologic. Vertebrae: T6-T10 posterior instrumented fusion and T8 laminectomy. Susceptibility artifact from the fusion hardware partially obscures the vertebral bodies at those levels. Stable moderate compression deformity of the T8 vertebral body. Vertebral body heights are otherwise well-preserved. There is heterogeneous bone marrow signal within the T7, T8, T9, and T12 vertebral bodies corresponding to prior enhancing lesions without definite enhancement on the current study, probably representing treated metastasis. Mild enhancement within the posterior T11 and posterior T12 vertebral bodies consistent with residual metastasis. Additionally, there is enhancement within the T8 vertebral body greater on the right and extending into the pedicle compatible with residual metastasis. Cord: No abnormal cord signal or enhancement. The spinal cord compression. No epidural disease identified. Paraspinal and other soft tissues: Subcentimeter pulmonary nodules in the right lung base (series 9, image 34) and indeterminate T2 hyperintense lesions in the right lobe of liver (series 9, image 47). Disc levels: No significant disc displacement or canal stenosis. MRI LUMBAR SPINE FINDINGS Segmentation:  Standard. Alignment:  Physiologic. Vertebrae: The diffusely heterogeneous lumbar spine and pelvis bone marrow signal with enhancement compatible with osseous metastatic disease. Metastatic disease is most pronounced at the T5 vertebral body with a for epidural disease effaces the anterior thecal  space and enters the bilateral neural foramen greater on the left. No loss of vertebral body height. Conus medullaris: Extends to the L1-2 level and appears normal. No abnormal enhancement. Paraspinal and other soft tissues: Negative. Disc levels: No significant disc displacement. Mild lower lumbar facet hypertrophy. Anterior epidural disease arising from the L5 vertebral body mildly narrows the spinal canal, effaces the lateral recesses with contact upon descending bilateral S1 nerve roots, moderately narrows the left and mildly narrows the right neural foramen. IMPRESSION: MRI thoracic spine: 1. Diffuse osseous metastasis with residual enhancing disease visualized at the T8, T11, and T12 vertebral body levels. No epidural disease. 2. Stable moderate compression deformity of T8 vertebral body. No new loss of vertebral body height. 3. Subcentimeter pulmonary nodules and indeterminate liver lesions may represent metastasis. MRI lumbar spine: 1. Diffuse osseous metastasis of lumbar spine and visible pelvis. 2. No loss of vertebral body height. 3. Anterior epidural disease at T5 results in moderate canal stenosis, lateral recess effacement with contact upon descending S1 nerve roots, and moderate left/mild right neural foraminal stenosis. Electronically Signed  By: Kristine Garbe M.D.   On: 09/05/2016 19:42   US Renal  Result Date: 09/06/2016 CLINICAL DATA:  Left-sided flank pain, acute renal failure EXAM: RENAL / URINARY TRACT ULTRASOUND COMPLETE COMPARISON:  12/16/2014 FINDINGS: Right Kidney: Length: 11.2 cm. Echogenicity within normal limits. No mass or hydronephrosis visualized. Left Kidney: Length: 11.6 cm. Echogenicity within normal limits. No mass or hydronephrosis visualized. Bladder: Appears normal for degree of bladder distention. Note is made of a small left-sided pleural effusion. IMPRESSION: No acute renal abnormality is noted. Small left pleural effusion is noted. Electronically Signed    By: Inez Catalina M.D.   On: 09/06/2016 07:37   Ct Hip Left Wo Contrast  Result Date: 09/06/2016 CLINICAL DATA:  Left hip pain.  History of metastatic breast cancer. EXAM: CT OF THE LEFT HIP WITHOUT CONTRAST TECHNIQUE: Multidetector CT imaging of the left hip was performed according to the standard protocol. Multiplanar CT image reconstructions were also generated. COMPARISON:  Bone scan dated April 06, 2016. CT abdomen pelvis dated December 16, 2014. FINDINGS: Bones/Joint/Cartilage Several pericentimeter mixed lytic and sclerotic lesions are seen in the left hemipelvis: A 13 mm lesion at the left puboacetabular junction, a 13 mm lesion in the posterior ischium, and a 13 mm lesion in the parasymphyseal left pubic bone. Partially visualized 12 mm predominately lytic lesion in the left sacral ala. Additional small subcentimeter lytic lesions are seen in the posterior ischium (series 2, image 21), left iliac wing (series 4, image 16), and the left acetabulum (series 4, image 26). These lesions correspond to areas of increased uptake on the prior bone scan, but were not present on prior CT from December 2016. No lesion is seen in the left proximal femur. No acute fracture or malalignment. The left hip joint space is relatively preserved. Ligaments Suboptimally assessed by CT. Muscles and Tendons No focal abnormality. Soft tissues Unremarkable. 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 No fracture. Electronically Signed   By: Titus Dubin M.D.   On: 09/06/2016 15:09   Nm Pulmonary Vent And Perf (v/q Scan)  Result Date: 09/05/2016 CLINICAL DATA:  Lumbar pain, positive D-dimer EXAM: NUCLEAR MEDICINE VENTILATION - PERFUSION LUNG SCAN TECHNIQUE: Ventilation images were obtained in multiple projections using inhaled aerosol [id number]. Perfusion images were obtained in multiple projections after intravenous injection of Tc-957mAA. RADIOPHARMACEUTICALS:  32 mCi Technetium-9926mPA aerosol  inhalation and 3.94 mCi Technetium-67m80m IV COMPARISON:  Chest x-ray 09/05/2016 FINDINGS: Ventilation: Heterogenous ventilation within the upper lobes. Central airways clumping of activity. Perfusion: Heterogenous perfusion. Multiple small relatively peripheral filling defects within the upper lobes similar in distribution to heterogeneous ventilation. IMPRESSION: Heterogenous profusion with multiple small matched defects in the upper lobes. Overall low probability for pulmonary embolus. Electronically Signed   By: Kim Donavan Foil.   On: 09/05/2016 22:03    Assessment: 46 y14. 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              (a) [place]  (2) additional right breast biopsy upper inner quadrant 06/08/2011 showed only a fibroadenoma, and central left breast  biopsy for another suspicious lesion showed only fibrocystic changes ([id number] [id number] and [id number]).   (3)Treated neoadjuvantly with cyclophosphamide and docetaxel x4 completed 08/29/2011.  (4) status post right modified radical mastectomy 10/03/2011 showing a residual pT1c pN1a (3/18 lymph nodes positive) invasive ductal carcinoma, grade 1,estrogen receptor 100% positive, progesterone receptor negative, with no HER-2 amplification ([id number] [id number])  (5) adjuvant radiation to the right chest wall, right supraclavicular fossa and right scar completed 12/26/2011  (6) tamoxifen started January 2014-- discontinued April 2016 with evidence of metastatic disease  (7) status post total hysterectomy with  bilateral salpingo-oophorectomy 12/23/2012 with benign pathology ([id number])  METASTATIC DISEASE 04/13/2014 (8) presented with T8 cord compression and underwent laminectomy 04/13/2014 with T8 decompression of spinal cord, posterior fixation from T6-T10 with pedicle screws and rods, posterior arthrodesis with allograft. 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 ExacTrac Snap verification was performed for each couch angle.              (b) radiation to the T-spine completed 05/08/2014.             (c[facility]             (d) SRS to 0.4 cm left parietal lesion 12/23/2015  (11) started fulvestrant 05/18/2014 and Palbociclib 06/01/2014             (a) Palbociclib dose decreased to 100 mg daily, 21/7 as of 08/05/2014             (b) palbociclib dose decreased to 75 mg daily, 21/7, starting May 2017             (c) palbociclib dose decreased to 75 mg every other day beginning 07/26/2015               (d) palbociclib held for month of October because of persistent low counts, resumed November             (e) fulvestrant discontinued after 11/02/2016 dose: Anastrozole started 08/01/2016                     (e)  (12) started zolendronate 05/18/2014, repeated every 12 weeks             (a) July 2017 zolendronate dose held because of dental issues, resumed 10/18/2015             (b) switched to denosumab as of 01/20/2016 with  concern of bony progression on zolendronate             (c) switching back to zolendronate as of April 2018 at patient's request  (13) most recent staging studies:             (a) brain MRI 07/13/2016 shows stable to  smaller treated lesions.             (b) bone scan 04/06/2016 shows no new lesions             (c) chest CT scan 04/06/2016 shows small bilateral pulmonary nodules with no evidence of disease progression  (d) brain MRI stable to slightly improved  (14) hypercalcemia and increased bone pain 09/05/2016 c/w progression bone involvement  (a) s/p pamidronate 09/05/2016 (note received zolendronate 08/01/2016)  Plan:  Anabeth's cancer has clinically progressed and we are stopping her anastrozole and palbociclib. Options going forward are chemotherapy vs further anti-estrogens and we will likely go with everolimus/exemestane. She may also benefit from changing zolendronate to denosumab/Xgeva. We will be discussing and implementing these changes over the next several days/  She may benefit from a short course of radiation for pain control-- will alert Rad Onc.  She will need restaging with a repeat CT Chest and bone scan-- that can be done as outpatient.  She remains very positive and in fact has done remarkably well for the first 2+ years of her documented metastatic disease.  Will follow with you   Chauncey Cruel, MD 09/06/2016  5:29 PM Medical Oncology and Hematology [facility] [address] [place], [place] [address] Tel. [phone]    Fax. [phone]

## 2016-09-07 ENCOUNTER — Ambulatory Visit (HOSPITAL_COMMUNITY): Admission: RE | Admit: 2016-09-07 | Payer: BC Managed Care – PPO | Source: Ambulatory Visit

## 2016-09-07 ENCOUNTER — Ambulatory Visit (HOSPITAL_COMMUNITY): Payer: BC Managed Care – PPO

## 2016-09-07 DIAGNOSIS — R079 Chest pain, unspecified: Secondary | ICD-10-CM

## 2016-09-07 DIAGNOSIS — E876 Hypokalemia: Secondary | ICD-10-CM

## 2016-09-07 DIAGNOSIS — I200 Unstable angina: Secondary | ICD-10-CM

## 2016-09-07 LAB — TYPE AND SCREEN
ABO/RH(D): B POS
Antibody Screen: NEGATIVE
Unit division: 0

## 2016-09-07 LAB — COMPREHENSIVE METABOLIC PANEL
ALBUMIN: 3.3 g/dL — AB (ref 3.5–5.0)
ALT: 12 U/L — ABNORMAL LOW (ref 14–54)
ANION GAP: 8 (ref 5–15)
AST: 41 U/L (ref 15–41)
Alkaline Phosphatase: 99 U/L (ref 38–126)
BILIRUBIN TOTAL: 0.5 mg/dL (ref 0.3–1.2)
BUN: 32 mg/dL — AB (ref 6–20)
CO2: 34 mmol/L — AB (ref 22–32)
Calcium: 11.7 mg/dL — ABNORMAL HIGH (ref 8.9–10.3)
Chloride: 101 mmol/L (ref 101–111)
Creatinine, Ser: 2.6 mg/dL — ABNORMAL HIGH (ref 0.44–1.00)
GFR calc Af Amer: 24 mL/min — ABNORMAL LOW (ref >60)
GFR calc non Af Amer: 21 mL/min — ABNORMAL LOW (ref >60)
GLUCOSE: 93 mg/dL (ref 65–99)
POTASSIUM: 3.1 mmol/L — AB (ref 3.5–5.1)
SODIUM: 143 mmol/L (ref 135–145)
TOTAL PROTEIN: 6 g/dL — AB (ref 6.5–8.1)

## 2016-09-07 LAB — CBC
HEMATOCRIT: 24.5 % — AB (ref 36.0–46.0)
HEMOGLOBIN: 8.3 g/dL — AB (ref 12.0–15.0)
MCH: 29.4 pg (ref 26.0–34.0)
MCHC: 33.9 g/dL (ref 30.0–36.0)
MCV: 86.9 fL (ref 78.0–100.0)
Platelets: 104 10*3/uL — ABNORMAL LOW (ref 150–400)
RBC: 2.82 MIL/uL — ABNORMAL LOW (ref 3.87–5.11)
RDW: 16.5 % — AB (ref 11.5–15.5)
WBC: 4.4 10*3/uL (ref 4.0–10.5)

## 2016-09-07 LAB — BPAM RBC
BLOOD PRODUCT EXPIRATION DATE: 201809182359
ISSUE DATE / TIME: 201808291539
UNIT TYPE AND RH: 7300

## 2016-09-07 MED ORDER — POTASSIUM CHLORIDE CRYS ER 20 MEQ PO TBCR
40.0000 meq | EXTENDED_RELEASE_TABLET | Freq: Three times a day (TID) | ORAL | Status: AC
  Administered 2016-09-07 (×3): 40 meq via ORAL
  Filled 2016-09-07 (×3): qty 2

## 2016-09-07 MED ORDER — FUROSEMIDE 10 MG/ML IJ SOLN
60.0000 mg | Freq: Once | INTRAMUSCULAR | Status: AC
  Administered 2016-09-07: 60 mg via INTRAVENOUS
  Filled 2016-09-07: qty 6

## 2016-09-07 MED ORDER — POLYETHYLENE GLYCOL 3350 17 G PO PACK
17.0000 g | PACK | Freq: Every day | ORAL | Status: DC
  Administered 2016-09-08 – 2016-09-13 (×5): 17 g via ORAL
  Filled 2016-09-07 (×6): qty 1

## 2016-09-07 MED ORDER — METHADONE HCL 5 MG PO TABS: 5.0000 mg | ORAL_TABLET | Freq: Three times a day (TID) | ORAL | Status: DC

## 2016-09-07 MED ORDER — EXEMESTANE 25 MG PO TABS
25.0000 mg | ORAL_TABLET | Freq: Every day | ORAL | Status: DC
  Administered 2016-09-08 – 2016-09-14 (×7): 25 mg via ORAL
  Filled 2016-09-07 (×7): qty 1

## 2016-09-07 MED ORDER — METHADONE HCL 5 MG PO TABS
2.5000 mg | ORAL_TABLET | Freq: Three times a day (TID) | ORAL | Status: DC
  Administered 2016-09-07 – 2016-09-14 (×19): 2.5 mg via ORAL
  Filled 2016-09-07 (×21): qty 1

## 2016-09-07 NOTE — Progress Notes (Signed)
Carolyn [name]   DOB:01/25/1970   GY#:[id number]   KAJ#:[id number]  Subjective:  Carolyn [name] is her usualpositiv self; pain is moderately controlled on current meds; she is having BMs; she is getting OOB and doing a little walking; she is agitating to go home in AM; no family in room    Objective: middle aged African American woman examined in bed Vitals:   09/06/16 2200 09/07/16 0603  BP: 119/62 136/71  Pulse: 90 94  Resp: 19 16  Temp: 97.7 F (36.5 C) 98.4 F (36.9 C)  SpO2: 100% 99%    Body mass index is 19.78 kg/m.  Intake/Output Summary (Last 24 hours) at 09/07/16 1306 Last data filed at 09/07/16 1254  Gross per 24 hour  Intake             2550 ml  Output             3100 ml  Net             -550 ml    CBG (last 3)  No results for input(s): GLUCAP in the last 72 hours.   Labs:  Lab Results  Component Value Date   WBC 4.4 09/07/2016   HGB 8.3 (L) 09/07/2016   HCT 24.5 (L) 09/07/2016   MCV 86.9 09/07/2016   PLT 104 (L) 09/07/2016   NEUTROABS 3.2 09/05/2016    _0 @  Urine Studies No results for input(s): UHGB, CRYS in the last 72 hours.  Invalid input(s): UACOL, UAPR, USPG, UPH, UTP, UGL, UKET, UBIL, UNIT, UROB, [place], UEPI, UWBC, [place], [place], [place], [place], [place]  Basic Metabolic Panel:  Recent Labs Lab 09/05/16 1644 09/06/16 0000 09/06/16 0542 09/07/16 0405  NA 142  --  144 143  K 2.9*  --  4.0 3.1*  CL 96*  --  107 101  CO2 37*  --  30 34*  GLUCOSE 122*  --  90 93  BUN 42*  --  35* 32*  CREATININE 3.68* 3.61* 3.12* 2.60*  CALCIUM 14.5*  --  12.4* 11.7*   GFR Estimated Creatinine Clearance: 24.9 mL/min (A) (by C-G formula based on SCr of 2.6 mg/dL (H)). Liver Function Tests:  Recent Labs Lab 09/05/16 1644 09/07/16 0405  AST 35 41  ALT 14 12*  ALKPHOS 116 99  BILITOT 0.6 0.5  PROT 7.3 6.0*  ALBUMIN 4.1 3.3*   No results for input(s): LIPASE, AMYLASE in the last 168 hours. No results for input(s): AMMONIA in the last 168  hours. Coagulation profile No results for input(s): INR, PROTIME in the last 168 hours.  CBC:  Recent Labs Lab 09/05/16 1644 09/06/16 0000 09/06/16 0542 09/07/16 0405  WBC 4.7 4.3 3.5* 4.4  NEUTROABS 3.2  --   --   --   HGB 8.5* 7.4* 7.0* 8.3*  HCT 25.4* 21.7* 20.8* 24.5*  MCV 90.7 90.8 89.7 86.9  PLT 124* 113* 93* 104*   Cardiac Enzymes: No results for input(s): CKTOTAL, CKMB, CKMBINDEX, TROPONINI in the last 168 hours. BNP: Invalid input(s): POCBNP CBG: No results for input(s): GLUCAP in the last 168 hours. D-Dimer  Recent Labs  09/05/16 1644  DDIMER 7.57*   Hgb A1c No results for input(s): HGBA1C in the last 72 hours. Lipid Profile No results for input(s): CHOL, HDL, LDLCALC, TRIG, CHOLHDL, LDLDIRECT in the last 72 hours. Thyroid function studies No results for input(s): TSH, T4TOTAL, T3FREE, THYROIDAB in the last 72 hours.  Invalid input(s): FREET3 Anemia work up No results for input(s): VITAMINB12, FOLATE,  FERRITIN, TIBC, IRON, RETICCTPCT in the last 72 hours. Microbiology No results found for this or any previous visit (from the past 240 hour(s)).    Studies:  Dg Chest 2 View  Result Date: 09/05/2016 CLINICAL DATA:  Lumbar pain radiating to the legs. EXAM: CHEST  2 VIEW COMPARISON:  June 17, 2014 FINDINGS: There is fullness of the left suprahilar region. The heart size normal. There is no focal infiltrate, pulmonary edema, or pleural effusion P Spinal rods are noted unchanged. Prior cholecystectomy clips are noted. IMPRESSION: Fullness of left suprahilar region. Further evaluation with a chest CT on outpatient basis is recommended to exclude mass. Electronically Signed   By: Abelardo Diesel M.D.   On: 09/05/2016 19:48   Mr Thoracic Spine W Wo Contrast  Result Date: 09/05/2016 CLINICAL DATA:  46 y/o  F; 329, image 5 EXAM: MRI THORACIC AND LUMBAR SPINE WITHOUT AND WITH CONTRAST TECHNIQUE: Multiplanar and multiecho pulse sequences of the thoracic and lumbar spine  were obtained without and with intravenous contrast. CONTRAST:  45m MULTIHANCE GADOBENATE DIMEGLUMINE 529 MG/ML IV SOLN COMPARISON:  04/06/2016, 01/14/2016, 05/03/2015 bone scan. 04/12/2014 lumbar and thoracic spine MRI FINDINGS: MRI THORACIC SPINE FINDINGS Alignment:  Physiologic. Vertebrae: T6-T10 posterior instrumented fusion and T8 laminectomy. Susceptibility artifact from the fusion hardware partially obscures the vertebral bodies at those levels. Stable moderate compression deformity of the T8 vertebral body. Vertebral body heights are otherwise well-preserved. There is heterogeneous bone marrow signal within the T7, T8, T9, and T12 vertebral bodies corresponding to prior enhancing lesions without definite enhancement on the current study, probably representing treated metastasis. Mild enhancement within the posterior T11 and posterior T12 vertebral bodies consistent with residual metastasis. Additionally, there is enhancement within the T8 vertebral body greater on the right and extending into the pedicle compatible with residual metastasis. Cord: No abnormal cord signal or enhancement. The spinal cord compression. No epidural disease identified. Paraspinal and other soft tissues: Subcentimeter pulmonary nodules in the right lung base (series 9, image 34) and indeterminate T2 hyperintense lesions in the right lobe of liver (series 9, image 47). Disc levels: No significant disc displacement or canal stenosis. MRI LUMBAR SPINE FINDINGS Segmentation:  Standard. Alignment:  Physiologic. Vertebrae: The diffusely heterogeneous lumbar spine and pelvis bone marrow signal with enhancement compatible with osseous metastatic disease. Metastatic disease is most pronounced at the T5 vertebral body with a for epidural disease effaces the anterior thecal space and enters the bilateral neural foramen greater on the left. No loss of vertebral body height. Conus medullaris: Extends to the L1-2 level and appears normal. No  abnormal enhancement. Paraspinal and other soft tissues: Negative. Disc levels: No significant disc displacement. Mild lower lumbar facet hypertrophy. Anterior epidural disease arising from the L5 vertebral body mildly narrows the spinal canal, effaces the lateral recesses with contact upon descending bilateral S1 nerve roots, moderately narrows the left and mildly narrows the right neural foramen. IMPRESSION: MRI thoracic spine: 1. Diffuse osseous metastasis with residual enhancing disease visualized at the T8, T11, and T12 vertebral body levels. No epidural disease. 2. Stable moderate compression deformity of T8 vertebral body. No new loss of vertebral body height. 3. Subcentimeter pulmonary nodules and indeterminate liver lesions may represent metastasis. MRI lumbar spine: 1. Diffuse osseous metastasis of lumbar spine and visible pelvis. 2. No loss of vertebral body height. 3. Anterior epidural disease at T5 results in moderate canal stenosis, lateral recess effacement with contact upon descending S1 nerve roots, and moderate left/mild right neural foraminal stenosis.  Electronically Signed   By: Kristine Garbe M.D.   On: 09/05/2016 19:42   Mr Lumbar Spine W Wo Contrast  Result Date: 09/05/2016 CLINICAL DATA:  46 y/o  F; 329, image 5 EXAM: MRI THORACIC AND LUMBAR SPINE WITHOUT AND WITH CONTRAST TECHNIQUE: Multiplanar and multiecho pulse sequences of the thoracic and lumbar spine were obtained without and with intravenous contrast. CONTRAST:  61m MULTIHANCE GADOBENATE DIMEGLUMINE 529 MG/ML IV SOLN COMPARISON:  04/06/2016, 01/14/2016, 05/03/2015 bone scan. 04/12/2014 lumbar and thoracic spine MRI FINDINGS: MRI THORACIC SPINE FINDINGS Alignment:  Physiologic. Vertebrae: T6-T10 posterior instrumented fusion and T8 laminectomy. Susceptibility artifact from the fusion hardware partially obscures the vertebral bodies at those levels. Stable moderate compression deformity of the T8 vertebral body. Vertebral  body heights are otherwise well-preserved. There is heterogeneous bone marrow signal within the T7, T8, T9, and T12 vertebral bodies corresponding to prior enhancing lesions without definite enhancement on the current study, probably representing treated metastasis. Mild enhancement within the posterior T11 and posterior T12 vertebral bodies consistent with residual metastasis. Additionally, there is enhancement within the T8 vertebral body greater on the right and extending into the pedicle compatible with residual metastasis. Cord: No abnormal cord signal or enhancement. The spinal cord compression. No epidural disease identified. Paraspinal and other soft tissues: Subcentimeter pulmonary nodules in the right lung base (series 9, image 34) and indeterminate T2 hyperintense lesions in the right lobe of liver (series 9, image 47). Disc levels: No significant disc displacement or canal stenosis. MRI LUMBAR SPINE FINDINGS Segmentation:  Standard. Alignment:  Physiologic. Vertebrae: The diffusely heterogeneous lumbar spine and pelvis bone marrow signal with enhancement compatible with osseous metastatic disease. Metastatic disease is most pronounced at the T5 vertebral body with a for epidural disease effaces the anterior thecal space and enters the bilateral neural foramen greater on the left. No loss of vertebral body height. Conus medullaris: Extends to the L1-2 level and appears normal. No abnormal enhancement. Paraspinal and other soft tissues: Negative. Disc levels: No significant disc displacement. Mild lower lumbar facet hypertrophy. Anterior epidural disease arising from the L5 vertebral body mildly narrows the spinal canal, effaces the lateral recesses with contact upon descending bilateral S1 nerve roots, moderately narrows the left and mildly narrows the right neural foramen. IMPRESSION: MRI thoracic spine: 1. Diffuse osseous metastasis with residual enhancing disease visualized at the T8, T11, and T12  vertebral body levels. No epidural disease. 2. Stable moderate compression deformity of T8 vertebral body. No new loss of vertebral body height. 3. Subcentimeter pulmonary nodules and indeterminate liver lesions may represent metastasis. MRI lumbar spine: 1. Diffuse osseous metastasis of lumbar spine and visible pelvis. 2. No loss of vertebral body height. 3. Anterior epidural disease at T5 results in moderate canal stenosis, lateral recess effacement with contact upon descending S1 nerve roots, and moderate left/mild right neural foraminal stenosis. Electronically Signed   By: LKristine GarbeM.D.   On: 09/05/2016 19:42   [place]  Result Date: 09/06/2016 CLINICAL DATA:  Left-sided flank pain, acute renal failure EXAM: RENAL / URINARY TRACT ULTRASOUND COMPLETE COMPARISON:  12/16/2014 FINDINGS: Right Kidney: Length: 11.2 cm. Echogenicity within normal limits. No mass or hydronephrosis visualized. Left Kidney: Length: 11.6 cm. Echogenicity within normal limits. No mass or hydronephrosis visualized. Bladder: Appears normal for degree of bladder distention. Note is made of a small left-sided pleural effusion. IMPRESSION: No acute renal abnormality is noted. Small left pleural effusion is noted. Electronically Signed   By: MLinus MakoD.  On: 09/06/2016 07:37   Ct Hip Left Wo Contrast  Result Date: 09/06/2016 CLINICAL DATA:  Left hip pain.  History of metastatic breast cancer. EXAM: CT OF THE LEFT HIP WITHOUT CONTRAST TECHNIQUE: Multidetector CT imaging of the left hip was performed according to the standard protocol. Multiplanar CT image reconstructions were also generated. COMPARISON:  Bone scan dated April 06, 2016. CT abdomen pelvis dated December 16, 2014. FINDINGS: Bones/Joint/Cartilage Several pericentimeter mixed lytic and sclerotic lesions are seen in the left hemipelvis: A 13 mm lesion at the left puboacetabular junction, a 13 mm lesion in the posterior ischium, and a 13 mm lesion in the  parasymphyseal left pubic bone. Partially visualized 12 mm predominately lytic lesion in the left sacral ala. Additional small subcentimeter lytic lesions are seen in the posterior ischium (series 2, image 21), left iliac wing (series 4, image 16), and the left acetabulum (series 4, image 26). These lesions correspond to areas of increased uptake on the prior bone scan, but were not present on prior CT from December 2016. No lesion is seen in the left proximal femur. No acute fracture or malalignment. The left hip joint space is relatively preserved. Ligaments Suboptimally assessed by CT. Muscles and Tendons No focal abnormality. Soft tissues Unremarkable. 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 No fracture. Electronically Signed   By: Titus Dubin M.D.   On: 09/06/2016 15:09   Nm Pulmonary Vent And Perf (v/q Scan)  Result Date: 09/05/2016 CLINICAL DATA:  Lumbar pain, positive D-dimer EXAM: NUCLEAR MEDICINE VENTILATION - PERFUSION LUNG SCAN TECHNIQUE: Ventilation images were obtained in multiple projections using inhaled aerosol Tc-24mDTPA. Perfusion images were obtained in multiple projections after intravenous injection of Tc-931mAA. RADIOPHARMACEUTICALS:  32 mCi Technetium-9983mPA aerosol inhalation and 3.94 mCi Technetium-36m53m IV COMPARISON:  Chest x-ray 09/05/2016 FINDINGS: Ventilation: Heterogenous ventilation within the upper lobes. Central airways clumping of activity. Perfusion: Heterogenous perfusion. Multiple small relatively peripheral filling defects within the upper lobes similar in distribution to heterogeneous ventilation. IMPRESSION: Heterogenous profusion with multiple small matched defects in the upper lobes. Overall low probability for pulmonary embolus. Electronically Signed   By: Kim Donavan Foil.   On: 09/05/2016 22:03    Assessment: 46 y67. 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              (a) V[place]  (2) additional right breast biopsy upper inner quadrant 06/08/2011 showed only a fibroadenoma, and central left breast biopsy for another suspicious lesion showed only fibrocystic changes (SAA [phone] [id number]).   (3)Treated neoadjuvantly with cyclophosphamide and docetaxel x4 completed 08/29/2011.  (4) status post right modified radical mastectomy 10/03/2011 showing a residual pT1c pN1a (3/18 lymph nodes positive) invasive ductal carcinoma, grade 1,estrogen receptor 100% positive, progesterone receptor negative, with no HER-2 amplification ([id number])  (5) adjuvant radiation to the right chest wall, right supraclavicular fossa and right scar completed 12/26/2011  (6) tamoxifen started January 2014-- discontinued April 2016 with evidence of metastatic disease  (7) status post total hysterectomy with bilateral salpingo-oophorectomy 12/23/2012 with benign pathology ([id number] [id number]  DISEASE 04/13/2014 (8) presented with T8 cord compression and underwent laminectomy 04/13/2014 with T8 decompression of spinal cord, posterior fixation from T6-T10 with pedicle screws and rods, posterior arthrodesis with allograft. 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 ExacTrac Snap verification was performed for each couch angle.              (b) radiation to the T-spine completed 05/08/2014.             (c) [facility]             (d) SRS to 0.4 cm left parietal lesion 12/23/2015  (11) started fulvestrant 05/18/2014 and Palbociclib 06/01/2014             (a) Palbociclib dose decreased to 100 mg daily, 21/7 as of 08/05/2014             (b) palbociclib dose decreased to 75 mg daily, 21/7, starting May 2017             (c) palbociclib dose decreased to 75 mg every other day beginning 07/26/2015               (d) palbociclib held for month of October because of persistent low counts, resumed November             (e) fulvestrant discontinued after 11/02/2016 dose: Anastrozole started 08/01/2016                     (e)  (12) started zolendronate 05/18/2014, repeated every 12 weeks             (a) July 2017 zolendronate dose held because of dental issues, resumed 10/18/2015             (b) switched to denosumab as of 01/20/2016 with concern of bony progression on zolendronate             (c) switching back to zolendronate as of April 2018 at patient's request  (13) most recent staging studies:             (a) brain MRI 07/13/2016 shows stable to smaller treated lesions.             (b) bone scan 04/06/2016 shows no new lesions             (c) chest CT scan 04/06/2016 shows small bilateral pulmonary nodules with no evidence of disease progression  (d) brain MRI stable to slightly improved  (14) hypercalcemia and increased bone pain 09/05/2016 c/w progression bone involvement  (a) s/p pamidronate 09/05/2016 (note received zolendronate 08/01/2016)  Plan:  I am going to try methadone 5 mg TID for pain  control; if this proves too much can consider NSAIDS and tramadol instead.  We will switch her from zolendronate to denosumab and her first dose will be 09/12/2016 in our office.   I will set her up for restaging studies as outpatient.  Will start exemestane now. Will obtain everolimus for her through our oral chemotherapy pharmacy specialists.  She is hoping to go home tomorrow. She will see [place] in the office 09/04  Greatly appreciate your help to this patient!  Chauncey Cruel, MD 09/07/2016  1:06 PM Medical Oncology and Hematology [facility] [address] [place], [place] [address] Tel. [phone]    Fax. [phone]

## 2016-09-07 NOTE — Progress Notes (Signed)
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 female with with history of metastatic breast cancer presents to the ER with complaints of low back pain radiating to her left lower extremity. Patient states she gets this pain mostly after getting her fulvestrant shots. Denies any incontinence of urine or feces. Patient states over the last few days patient has benign poor appetite with nausea vomiting. Denies any diarrhea.   ED Course: In the ER patient had MRI of the T and L-spine which shows progressive metastasis. On exam patient is able to move her extremities. Labs revealed severe hypercalcemia with calcium around 14.5 and creatinine has worsened to 3.6 from almost normal last month. EKG shows normal sinus rhythm with QTC of 383 ms and QRS of 83 ms. Patient was given 2 L normal saline bolus in the ER for hypercalcemia in addition patient also has hypo-kalemia.  Assessment and plan 1. Severe hypercalcemia - likely related to metastatic cancer. We'll hold off her calcium supplements and vitamin D supplements. Patient has received aggressive IV fluids, continue normal saline with bicarb  at 150 mL/h. Given   pamidronate   60 mg IV 1 dose now. Patient also started on diuresis with IV Lasix to expedite resolution of hypercalcemia 2. Low back pain/left hip pain likely related to patient's metastatic cancer   -patient has been evaluated by Dr. [name], may need radiation oncology to consider radiation for pain control 3. Hypokalemia likely could be from poor oral intake - Lasix, replete and recheck 4. Acute renal failure - cause not clear could be prerenal from poor oral intake. Likely from elevated  calcium, uric acid. Continue IV hydration, NSAIDs discontinued. 5. Anemia likely from malignancy related - follow CBC. Hemoglobin 7.0 . Status post 1 unit of packed red blood cells, hemoglobin improved 6. Metastatic breast cancer-may need restaging with CT chest and bone scan. Appreciate oncology input   DVT prophylaxsis heparin  Code Status:  Full code  Family Communication: Discussed in detail with the patient, all imaging results, lab results explained to the patient   Disposition Plan:  Continue to manage her acute medical issues, not stable for discharge     Consultants:  Hematology oncology  Procedures:  None  Antibiotics: Anti-infectives    None         HPI/Subjective: Patient denies any severe pain today in her left hip, minimal back pain  Objective: Vitals:   09/06/16 1605 09/06/16 1840 09/06/16 2200 09/07/16 0603  BP: 133/76 (!) 143/74 119/62 136/71  Pulse: 86 92 90 94  Resp: 18 16 19 16   Temp: 98.3 F (36.8 C) 98.3 F (36.8 C) 97.7 F (36.5 C) 98.4 F (36.9 C)  TempSrc: Oral Axillary Oral Oral  SpO2: 100% 100% 100% 99%  Weight:      Height:  5\' 8"  (1.727 m)      Intake/Output Summary (Last 24 hours) at 09/07/16 0850 Last data filed at 09/07/16 0800  Gross per 24 hour  Intake             2550 ml  Output             2100 ml  Net              450 ml    Exam:  Examination:  General exam: Appears calm and comfortable  Respiratory system: Clear to auscultation. Respiratory effort normal. Cardiovascular system: S1 & S2 heard, RRR. No JVD, murmurs, rubs, gallops or clicks. No pedal edema. Gastrointestinal system: Abdomen is nondistended, soft and nontender. No organomegaly or masses felt. Normal bowel sounds heard. Central nervous system: Alert and oriented. No focal neurological deficits. Extremities: Symmetric 5 x 5 power. Skin: No rashes, lesions or ulcers Psychiatry: Judgement and insight appear normal. Mood & affect appropriate.      Data Reviewed: I have personally reviewed following labs and imaging studies  Micro Results No results found for this or any previous visit (from the past 240 hour(s)).  Radiology Reports Dg Chest 2 View  Result Date: 09/05/2016 CLINICAL DATA:  Lumbar pain radiating to the legs. EXAM: CHEST  2 VIEW COMPARISON:  June 17, 2014 FINDINGS: There is fullness of the left suprahilar region. The heart size normal. There is no focal infiltrate, pulmonary edema, or pleural effusion P Spinal rods are noted unchanged. Prior cholecystectomy clips are noted. IMPRESSION: Fullness of left suprahilar region. Further evaluation with a chest CT on outpatient basis is recommended to exclude mass. Electronically Signed   By: Abelardo Diesel M.D.   On: 09/05/2016 19:48   Mr Thoracic Spine W Wo Contrast  Result Date: 09/05/2016 CLINICAL DATA:  46 y/o  F; 329, image 5 EXAM: MRI THORACIC AND LUMBAR SPINE WITHOUT AND WITH CONTRAST TECHNIQUE: Multiplanar and multiecho pulse sequences of the thoracic and lumbar spine were obtained without and with intravenous contrast. CONTRAST:  45mL MULTIHANCE GADOBENATE DIMEGLUMINE 529 MG/ML IV SOLN COMPARISON:  04/06/2016, 01/14/2016, 05/03/2015 bone scan. 04/12/2014 lumbar and thoracic spine MRI FINDINGS: MRI THORACIC SPINE FINDINGS Alignment:  Physiologic. Vertebrae: T6-T10 posterior instrumented fusion and T8 laminectomy. Susceptibility artifact from the fusion hardware partially obscures the vertebral bodies at those levels. Stable moderate compression deformity of the T8 vertebral body. Vertebral body heights are otherwise well-preserved. There is heterogeneous bone marrow signal within the T7, T8, T9, and T12 vertebral bodies corresponding to prior enhancing lesions without definite enhancement on the current study, probably representing treated metastasis. Mild enhancement within the posterior T11 and posterior T12 vertebral bodies consistent with residual metastasis. Additionally,  there is enhancement within the T8 vertebral body greater on the right and extending into the pedicle compatible with residual metastasis. Cord: No abnormal cord signal or enhancement. The spinal cord compression. No epidural disease identified. Paraspinal and other soft tissues: Subcentimeter pulmonary nodules in the right lung base (series 9, image 34) and indeterminate T2 hyperintense lesions in the right lobe of liver (series 9, image 47). Disc levels: No significant disc displacement or canal stenosis. MRI LUMBAR SPINE FINDINGS Segmentation:  Standard. Alignment:  Physiologic. Vertebrae: The diffusely heterogeneous lumbar spine and pelvis bone marrow signal with enhancement compatible with osseous metastatic disease. Metastatic disease is most pronounced at the T5 vertebral body with a for epidural disease effaces the anterior thecal space and enters the bilateral neural foramen greater on the left. No loss of vertebral body height. Conus medullaris: Extends to the L1-2 level and appears normal. No abnormal enhancement. Paraspinal and other soft tissues: Negative. Disc levels: No significant disc displacement. Mild lower lumbar facet hypertrophy. Anterior epidural disease arising from the L5 vertebral body mildly narrows the spinal canal, effaces the lateral recesses with contact upon descending bilateral S1 nerve roots, moderately  narrows the left and mildly narrows the right neural foramen. IMPRESSION: MRI thoracic spine: 1. Diffuse osseous metastasis with residual enhancing disease visualized at the T8, T11, and T12 vertebral body levels. No epidural disease. 2. Stable moderate compression deformity of T8 vertebral body. No new loss of vertebral body height. 3. Subcentimeter pulmonary nodules and indeterminate liver lesions may represent metastasis. MRI lumbar spine: 1. Diffuse osseous metastasis of lumbar spine and visible pelvis. 2. No loss of vertebral body height. 3. Anterior epidural disease at T5  results in moderate canal stenosis, lateral recess effacement with contact upon descending S1 nerve roots, and moderate left/mild right neural foraminal stenosis. Electronically Signed   By: Kristine Garbe M.D.   On: 09/05/2016 19:42   Mr Lumbar Spine W Wo Contrast  Result Date: 09/05/2016 CLINICAL DATA:  46 y/o  F; 329, image 5 EXAM: MRI THORACIC AND LUMBAR SPINE WITHOUT AND WITH CONTRAST TECHNIQUE: Multiplanar and multiecho pulse sequences of the thoracic and lumbar spine were obtained without and with intravenous contrast. CONTRAST:  29mL MULTIHANCE GADOBENATE DIMEGLUMINE 529 MG/ML IV SOLN COMPARISON:  04/06/2016, 01/14/2016, 05/03/2015 bone scan. 04/12/2014 lumbar and thoracic spine MRI FINDINGS: MRI THORACIC SPINE FINDINGS Alignment:  Physiologic. Vertebrae: T6-T10 posterior instrumented fusion and T8 laminectomy. Susceptibility artifact from the fusion hardware partially obscures the vertebral bodies at those levels. Stable moderate compression deformity of the T8 vertebral body. Vertebral body heights are otherwise well-preserved. There is heterogeneous bone marrow signal within the T7, T8, T9, and T12 vertebral bodies corresponding to prior enhancing lesions without definite enhancement on the current study, probably representing treated metastasis. Mild enhancement within the posterior T11 and posterior T12 vertebral bodies consistent with residual metastasis. Additionally, there is enhancement within the T8 vertebral body greater on the right and extending into the pedicle compatible with residual metastasis. Cord: No abnormal cord signal or enhancement. The spinal cord compression. No epidural disease identified. Paraspinal and other soft tissues: Subcentimeter pulmonary nodules in the right lung base (series 9, image 34) and indeterminate T2 hyperintense lesions in the right lobe of liver (series 9, image 47). Disc levels: No significant disc displacement or canal stenosis. MRI LUMBAR SPINE  FINDINGS Segmentation:  Standard. Alignment:  Physiologic. Vertebrae: The diffusely heterogeneous lumbar spine and pelvis bone marrow signal with enhancement compatible with osseous metastatic disease. Metastatic disease is most pronounced at the T5 vertebral body with a for epidural disease effaces the anterior thecal space and enters the bilateral neural foramen greater on the left. No loss of vertebral body height. Conus medullaris: Extends to the L1-2 level and appears normal. No abnormal enhancement. Paraspinal and other soft tissues: Negative. Disc levels: No significant disc displacement. Mild lower lumbar facet hypertrophy. Anterior epidural disease arising from the L5 vertebral body mildly narrows the spinal canal, effaces the lateral recesses with contact upon descending bilateral S1 nerve roots, moderately narrows the left and mildly narrows the right neural foramen. IMPRESSION: MRI thoracic spine: 1. Diffuse osseous metastasis with residual enhancing disease visualized at the T8, T11, and T12 vertebral body levels. No epidural disease. 2. Stable moderate compression deformity of T8 vertebral body. No new loss of vertebral body height. 3. Subcentimeter pulmonary nodules and indeterminate liver lesions may represent metastasis. MRI lumbar spine: 1. Diffuse osseous metastasis of lumbar spine and visible pelvis. 2. No loss of vertebral body height. 3. Anterior epidural disease at T5 results in moderate canal stenosis, lateral recess effacement with contact upon descending S1 nerve roots, and moderate left/mild right neural foraminal stenosis.  Electronically Signed   By: Kristine Garbe M.D.   On: 09/05/2016 19:42   US Renal  Result Date: 09/06/2016 CLINICAL DATA:  Left-sided flank pain, acute renal failure EXAM: RENAL / URINARY TRACT ULTRASOUND COMPLETE COMPARISON:  12/16/2014 FINDINGS: Right Kidney: Length: 11.2 cm. Echogenicity within normal limits. No mass or hydronephrosis visualized. Left  Kidney: Length: 11.6 cm. Echogenicity within normal limits. No mass or hydronephrosis visualized. Bladder: Appears normal for degree of bladder distention. Note is made of a small left-sided pleural effusion. IMPRESSION: No acute renal abnormality is noted. Small left pleural effusion is noted. Electronically Signed   By: Inez Catalina M.D.   On: 09/06/2016 07:37   Ct Hip Left Wo Contrast  Result Date: 09/06/2016 CLINICAL DATA:  Left hip pain.  History of metastatic breast cancer. EXAM: CT OF THE LEFT HIP WITHOUT CONTRAST TECHNIQUE: Multidetector CT imaging of the left hip was performed according to the standard protocol. Multiplanar CT image reconstructions were also generated. COMPARISON:  Bone scan dated April 06, 2016. CT abdomen pelvis dated December 16, 2014. FINDINGS: Bones/Joint/Cartilage Several pericentimeter mixed lytic and sclerotic lesions are seen in the left hemipelvis: A 13 mm lesion at the left puboacetabular junction, a 13 mm lesion in the posterior ischium, and a 13 mm lesion in the parasymphyseal left pubic bone. Partially visualized 12 mm predominately lytic lesion in the left sacral ala. Additional small subcentimeter lytic lesions are seen in the posterior ischium (series 2, image 21), left iliac wing (series 4, image 16), and the left acetabulum (series 4, image 26). These lesions correspond to areas of increased uptake on the prior bone scan, but were not present on prior CT from December 2016. No lesion is seen in the left proximal femur. No acute fracture or malalignment. The left hip joint space is relatively preserved. Ligaments Suboptimally assessed by CT. Muscles and Tendons No focal abnormality. Soft tissues Unremarkable. 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 No fracture. Electronically Signed   By: Titus Dubin M.D.   On: 09/06/2016 15:09   Nm Pulmonary Vent And Perf (v/q Scan)  Result Date: 09/05/2016 CLINICAL DATA:  Lumbar pain, positive  D-dimer EXAM: NUCLEAR MEDICINE VENTILATION - PERFUSION LUNG SCAN TECHNIQUE: Ventilation images were obtained in multiple projections using inhaled aerosol Tc-31m DTPA. Perfusion images were obtained in multiple projections after intravenous injection of Tc-18m MAA. RADIOPHARMACEUTICALS:  32 mCi Technetium-66m DTPA aerosol inhalation and 3.94 mCi Technetium-57m MAA IV COMPARISON:  Chest x-ray 09/05/2016 FINDINGS: Ventilation: Heterogenous ventilation within the upper lobes. Central airways clumping of activity. Perfusion: Heterogenous perfusion. Multiple small relatively peripheral filling defects within the upper lobes similar in distribution to heterogeneous ventilation. IMPRESSION: Heterogenous profusion with multiple small matched defects in the upper lobes. Overall low probability for pulmonary embolus. Electronically Signed   By: Donavan Foil M.D.   On: 09/05/2016 22:03     CBC  Recent Labs Lab 09/05/16 1644 09/06/16 0000 09/06/16 0542 09/07/16 0405  WBC 4.7 4.3 3.5* 4.4  HGB 8.5* 7.4* 7.0* 8.3*  HCT 25.4* 21.7* 20.8* 24.5*  PLT 124* 113* 93* 104*  MCV 90.7 90.8 89.7 86.9  MCH 30.4 31.0 30.2 29.4  MCHC 33.5 34.1 33.7 33.9  RDW 14.7 14.6 14.6 16.5*  LYMPHSABS 1.2  --   --   --   MONOABS 0.3  --   --   --   EOSABS 0.0  --   --   --   BASOSABS 0.0  --   --   --  Chemistries   Recent Labs Lab 09/05/16 1644 09/06/16 0000 09/06/16 0542 09/07/16 0405  NA 142  --  144 143  K 2.9*  --  4.0 3.1*  CL 96*  --  107 101  CO2 37*  --  30 34*  GLUCOSE 122*  --  90 93  BUN 42*  --  35* 32*  CREATININE 3.68* 3.61* 3.12* 2.60*  CALCIUM 14.5*  --  12.4* 11.7*  AST 35  --   --  41  ALT 14  --   --  12*  ALKPHOS 116  --   --  99  BILITOT 0.6  --   --  0.5   ------------------------------------------------------------------------------------------------------------------ estimated creatinine clearance is 24.9 mL/min (A) (by C-G formula based on SCr of 2.6 mg/dL  (H)). ------------------------------------------------------------------------------------------------------------------ No results for input(s): HGBA1C in the last 72 hours. ------------------------------------------------------------------------------------------------------------------ No results for input(s): CHOL, HDL, LDLCALC, TRIG, CHOLHDL, LDLDIRECT in the last 72 hours. ------------------------------------------------------------------------------------------------------------------ No results for input(s): TSH, T4TOTAL, T3FREE, THYROIDAB in the last 72 hours.  Invalid input(s): FREET3 ------------------------------------------------------------------------------------------------------------------ No results for input(s): VITAMINB12, FOLATE, FERRITIN, TIBC, IRON, RETICCTPCT in the last 72 hours.  Coagulation profile No results for input(s): INR, PROTIME in the last 168 hours.   Recent Labs  09/05/16 1644  DDIMER 7.57*    Cardiac Enzymes No results for input(s): CKMB, TROPONINI, MYOGLOBIN in the last 168 hours.  Invalid input(s): CK ------------------------------------------------------------------------------------------------------------------ Invalid input(s): POCBNP   CBG: No results for input(s): GLUCAP in the last 168 hours.     Studies: Dg Chest 2 View  Result Date: 09/05/2016 CLINICAL DATA:  Lumbar pain radiating to the legs. EXAM: CHEST  2 VIEW COMPARISON:  June 17, 2014 FINDINGS: There is fullness of the left suprahilar region. The heart size normal. There is no focal infiltrate, pulmonary edema, or pleural effusion P Spinal rods are noted unchanged. Prior cholecystectomy clips are noted. IMPRESSION: Fullness of left suprahilar region. Further evaluation with a chest CT on outpatient basis is recommended to exclude mass. Electronically Signed   By: Abelardo Diesel M.D.   On: 09/05/2016 19:48   Mr Thoracic Spine W Wo Contrast  Result Date: 09/05/2016 CLINICAL  DATA:  45 y/o  F; 329, image 5 EXAM: MRI THORACIC AND LUMBAR SPINE WITHOUT AND WITH CONTRAST TECHNIQUE: Multiplanar and multiecho pulse sequences of the thoracic and lumbar spine were obtained without and with intravenous contrast. CONTRAST:  71mL MULTIHANCE GADOBENATE DIMEGLUMINE 529 MG/ML IV SOLN COMPARISON:  04/06/2016, 01/14/2016, 05/03/2015 bone scan. 04/12/2014 lumbar and thoracic spine MRI FINDINGS: MRI THORACIC SPINE FINDINGS Alignment:  Physiologic. Vertebrae: T6-T10 posterior instrumented fusion and T8 laminectomy. Susceptibility artifact from the fusion hardware partially obscures the vertebral bodies at those levels. Stable moderate compression deformity of the T8 vertebral body. Vertebral body heights are otherwise well-preserved. There is heterogeneous bone marrow signal within the T7, T8, T9, and T12 vertebral bodies corresponding to prior enhancing lesions without definite enhancement on the current study, probably representing treated metastasis. Mild enhancement within the posterior T11 and posterior T12 vertebral bodies consistent with residual metastasis. Additionally, there is enhancement within the T8 vertebral body greater on the right and extending into the pedicle compatible with residual metastasis. Cord: No abnormal cord signal or enhancement. The spinal cord compression. No epidural disease identified. Paraspinal and other soft tissues: Subcentimeter pulmonary nodules in the right lung base (series 9, image 34) and indeterminate T2 hyperintense lesions in the right lobe of liver (series 9, image 47). Disc levels: No significant disc  displacement or canal stenosis. MRI LUMBAR SPINE FINDINGS Segmentation:  Standard. Alignment:  Physiologic. Vertebrae: The diffusely heterogeneous lumbar spine and pelvis bone marrow signal with enhancement compatible with osseous metastatic disease. Metastatic disease is most pronounced at the T5 vertebral body with a for epidural disease effaces the anterior  thecal space and enters the bilateral neural foramen greater on the left. No loss of vertebral body height. Conus medullaris: Extends to the L1-2 level and appears normal. No abnormal enhancement. Paraspinal and other soft tissues: Negative. Disc levels: No significant disc displacement. Mild lower lumbar facet hypertrophy. Anterior epidural disease arising from the L5 vertebral body mildly narrows the spinal canal, effaces the lateral recesses with contact upon descending bilateral S1 nerve roots, moderately narrows the left and mildly narrows the right neural foramen. IMPRESSION: MRI thoracic spine: 1. Diffuse osseous metastasis with residual enhancing disease visualized at the T8, T11, and T12 vertebral body levels. No epidural disease. 2. Stable moderate compression deformity of T8 vertebral body. No new loss of vertebral body height. 3. Subcentimeter pulmonary nodules and indeterminate liver lesions may represent metastasis. MRI lumbar spine: 1. Diffuse osseous metastasis of lumbar spine and visible pelvis. 2. No loss of vertebral body height. 3. Anterior epidural disease at T5 results in moderate canal stenosis, lateral recess effacement with contact upon descending S1 nerve roots, and moderate left/mild right neural foraminal stenosis. Electronically Signed   By: Kristine Garbe M.D.   On: 09/05/2016 19:42   Mr Lumbar Spine W Wo Contrast  Result Date: 09/05/2016 CLINICAL DATA:  46 y/o  F; 329, image 5 EXAM: MRI THORACIC AND LUMBAR SPINE WITHOUT AND WITH CONTRAST TECHNIQUE: Multiplanar and multiecho pulse sequences of the thoracic and lumbar spine were obtained without and with intravenous contrast. CONTRAST:  64mL MULTIHANCE GADOBENATE DIMEGLUMINE 529 MG/ML IV SOLN COMPARISON:  04/06/2016, 01/14/2016, 05/03/2015 bone scan. 04/12/2014 lumbar and thoracic spine MRI FINDINGS: MRI THORACIC SPINE FINDINGS Alignment:  Physiologic. Vertebrae: T6-T10 posterior instrumented fusion and T8 laminectomy.  Susceptibility artifact from the fusion hardware partially obscures the vertebral bodies at those levels. Stable moderate compression deformity of the T8 vertebral body. Vertebral body heights are otherwise well-preserved. There is heterogeneous bone marrow signal within the T7, T8, T9, and T12 vertebral bodies corresponding to prior enhancing lesions without definite enhancement on the current study, probably representing treated metastasis. Mild enhancement within the posterior T11 and posterior T12 vertebral bodies consistent with residual metastasis. Additionally, there is enhancement within the T8 vertebral body greater on the right and extending into the pedicle compatible with residual metastasis. Cord: No abnormal cord signal or enhancement. The spinal cord compression. No epidural disease identified. Paraspinal and other soft tissues: Subcentimeter pulmonary nodules in the right lung base (series 9, image 34) and indeterminate T2 hyperintense lesions in the right lobe of liver (series 9, image 47). Disc levels: No significant disc displacement or canal stenosis. MRI LUMBAR SPINE FINDINGS Segmentation:  Standard. Alignment:  Physiologic. Vertebrae: The diffusely heterogeneous lumbar spine and pelvis bone marrow signal with enhancement compatible with osseous metastatic disease. Metastatic disease is most pronounced at the T5 vertebral body with a for epidural disease effaces the anterior thecal space and enters the bilateral neural foramen greater on the left. No loss of vertebral body height. Conus medullaris: Extends to the L1-2 level and appears normal. No abnormal enhancement. Paraspinal and other soft tissues: Negative. Disc levels: No significant disc displacement. Mild lower lumbar facet hypertrophy. Anterior epidural disease arising from the L5 vertebral body mildly narrows the  spinal canal, effaces the lateral recesses with contact upon descending bilateral S1 nerve roots, moderately narrows the  left and mildly narrows the right neural foramen. IMPRESSION: MRI thoracic spine: 1. Diffuse osseous metastasis with residual enhancing disease visualized at the T8, T11, and T12 vertebral body levels. No epidural disease. 2. Stable moderate compression deformity of T8 vertebral body. No new loss of vertebral body height. 3. Subcentimeter pulmonary nodules and indeterminate liver lesions may represent metastasis. MRI lumbar spine: 1. Diffuse osseous metastasis of lumbar spine and visible pelvis. 2. No loss of vertebral body height. 3. Anterior epidural disease at T5 results in moderate canal stenosis, lateral recess effacement with contact upon descending S1 nerve roots, and moderate left/mild right neural foraminal stenosis. Electronically Signed   By: Kristine Garbe M.D.   On: 09/05/2016 19:42   US Renal  Result Date: 09/06/2016 CLINICAL DATA:  Left-sided flank pain, acute renal failure EXAM: RENAL / URINARY TRACT ULTRASOUND COMPLETE COMPARISON:  12/16/2014 FINDINGS: Right Kidney: Length: 11.2 cm. Echogenicity within normal limits. No mass or hydronephrosis visualized. Left Kidney: Length: 11.6 cm. Echogenicity within normal limits. No mass or hydronephrosis visualized. Bladder: Appears normal for degree of bladder distention. Note is made of a small left-sided pleural effusion. IMPRESSION: No acute renal abnormality is noted. Small left pleural effusion is noted. Electronically Signed   By: Inez Catalina M.D.   On: 09/06/2016 07:37   Ct Hip Left Wo Contrast  Result Date: 09/06/2016 CLINICAL DATA:  Left hip pain.  History of metastatic breast cancer. EXAM: CT OF THE LEFT HIP WITHOUT CONTRAST TECHNIQUE: Multidetector CT imaging of the left hip was performed according to the standard protocol. Multiplanar CT image reconstructions were also generated. COMPARISON:  Bone scan dated April 06, 2016. CT abdomen pelvis dated December 16, 2014. FINDINGS: Bones/Joint/Cartilage Several pericentimeter mixed  lytic and sclerotic lesions are seen in the left hemipelvis: A 13 mm lesion at the left puboacetabular junction, a 13 mm lesion in the posterior ischium, and a 13 mm lesion in the parasymphyseal left pubic bone. Partially visualized 12 mm predominately lytic lesion in the left sacral ala. Additional small subcentimeter lytic lesions are seen in the posterior ischium (series 2, image 21), left iliac wing (series 4, image 16), and the left acetabulum (series 4, image 26). These lesions correspond to areas of increased uptake on the prior bone scan, but were not present on prior CT from December 2016. No lesion is seen in the left proximal femur. No acute fracture or malalignment. The left hip joint space is relatively preserved. Ligaments Suboptimally assessed by CT. Muscles and Tendons No focal abnormality. Soft tissues Unremarkable. 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 No fracture. Electronically Signed   By: Titus Dubin M.D.   On: 09/06/2016 15:09   Nm Pulmonary Vent And Perf (v/q Scan)  Result Date: 09/05/2016 CLINICAL DATA:  Lumbar pain, positive D-dimer EXAM: NUCLEAR MEDICINE VENTILATION - PERFUSION LUNG SCAN TECHNIQUE: Ventilation images were obtained in multiple projections using inhaled aerosol Tc-23m DTPA. Perfusion images were obtained in multiple projections after intravenous injection of Tc-21m MAA. RADIOPHARMACEUTICALS:  32 mCi Technetium-46m DTPA aerosol inhalation and 3.94 mCi Technetium-33m MAA IV COMPARISON:  Chest x-ray 09/05/2016 FINDINGS: Ventilation: Heterogenous ventilation within the upper lobes. Central airways clumping of activity. Perfusion: Heterogenous perfusion. Multiple small relatively peripheral filling defects within the upper lobes similar in distribution to heterogeneous ventilation. IMPRESSION: Heterogenous profusion with multiple small matched defects in the upper lobes. Overall low probability for pulmonary  embolus. Electronically Signed    By: Donavan Foil M.D.   On: 09/05/2016 22:03      No results found for: HGBA1C Lab Results  Component Value Date   CREATININE 2.60 (H) 09/07/2016       Scheduled Meds: . docusate sodium  200 mg Oral Daily  . furosemide  60 mg Intravenous Once  . heparin  5,000 Units Subcutaneous Q8H  . mouth rinse  15 mL Mouth Rinse BID  . polyethylene glycol  17 g Oral BID  . potassium chloride  40 mEq Oral TID   Continuous Infusions: .  sodium bicarbonate infusion 1/4 NS 1000 mL 125 mL/hr at 09/07/16 0123     LOS: 2 days    Time spent: >30 MINS    Reyne Dumas  [facility] Pager [phone]. If 7PM-7AM, please contact night-coverage at [email or web address], password [facility] 09/07/2016, 8:50 AM  LOS: 2 days

## 2016-09-08 ENCOUNTER — Ambulatory Visit
Admission: RE | Admit: 2016-09-08 | Discharge: 2016-09-08 | Disposition: A | Discharge status: Home / Self Care | Payer: BC Managed Care – PPO | Source: Ambulatory Visit | Attending: Radiation Oncology | Admitting: Radiation Oncology

## 2016-09-08 DIAGNOSIS — N179 Acute kidney failure, unspecified: Secondary | ICD-10-CM

## 2016-09-08 DIAGNOSIS — C7951 Secondary malignant neoplasm of bone: Secondary | ICD-10-CM | POA: Diagnosis not present

## 2016-09-08 LAB — COMPREHENSIVE METABOLIC PANEL
ALBUMIN: 3.1 g/dL — AB (ref 3.5–5.0)
ALT: 13 U/L — ABNORMAL LOW (ref 14–54)
ANION GAP: 7 (ref 5–15)
AST: 32 U/L (ref 15–41)
Alkaline Phosphatase: 108 U/L (ref 38–126)
BUN: 29 mg/dL — AB (ref 6–20)
CHLORIDE: 101 mmol/L (ref 101–111)
CO2: 33 mmol/L — ABNORMAL HIGH (ref 22–32)
Calcium: 10.8 mg/dL — ABNORMAL HIGH (ref 8.9–10.3)
Creatinine, Ser: 2.27 mg/dL — ABNORMAL HIGH (ref 0.44–1.00)
GFR calc Af Amer: 28 mL/min — ABNORMAL LOW (ref >60)
GFR, EST NON AFRICAN AMERICAN: 25 mL/min — AB (ref >60)
Glucose, Bld: 93 mg/dL (ref 65–99)
POTASSIUM: 3.9 mmol/L (ref 3.5–5.1)
Sodium: 141 mmol/L (ref 135–145)
Total Bilirubin: 0.5 mg/dL (ref 0.3–1.2)
Total Protein: 6.1 g/dL — ABNORMAL LOW (ref 6.5–8.1)

## 2016-09-08 LAB — CBC
HEMATOCRIT: 25.3 % — AB (ref 36.0–46.0)
HEMOGLOBIN: 8.6 g/dL — AB (ref 12.0–15.0)
MCH: 29.5 pg (ref 26.0–34.0)
MCHC: 34 g/dL (ref 30.0–36.0)
MCV: 86.6 fL (ref 78.0–100.0)
Platelets: 105 10*3/uL — ABNORMAL LOW (ref 150–400)
RBC: 2.92 MIL/uL — ABNORMAL LOW (ref 3.87–5.11)
RDW: 16 % — AB (ref 11.5–15.5)
WBC: 4.2 10*3/uL (ref 4.0–10.5)

## 2016-09-08 LAB — URIC ACID: Uric Acid, Serum: 7.8 mg/dL — ABNORMAL HIGH (ref 2.3–6.6)

## 2016-09-08 LAB — CANCER ANTIGEN 27.29: CAN 27.29: 105.9 U/mL — AB (ref 0.0–38.6)

## 2016-09-08 MED ORDER — FUROSEMIDE 10 MG/ML IJ SOLN
40.0000 mg | Freq: Two times a day (BID) | INTRAMUSCULAR | Status: AC
  Administered 2016-09-08 – 2016-09-09 (×2): 40 mg via INTRAVENOUS
  Filled 2016-09-08 (×2): qty 4

## 2016-09-08 MED ORDER — STERILE WATER FOR INJECTION IV SOLN
INTRAVENOUS | Status: AC
  Administered 2016-09-08 – 2016-09-09 (×5): via INTRAVENOUS
  Filled 2016-09-08 (×8): qty 9.71

## 2016-09-08 MED ORDER — DENOSUMAB 120 MG/1.7ML ~~LOC~~ SOLN
120.0000 mg | Freq: Once | SUBCUTANEOUS | Status: DC
Start: 2016-09-08 — End: 2016-09-08

## 2016-09-08 MED ORDER — BISACODYL 10 MG RE SUPP
10.0000 mg | Freq: Once | RECTAL | Status: AC
  Administered 2016-09-08: 10 mg via RECTAL
  Filled 2016-09-08: qty 1

## 2016-09-08 MED ORDER — POTASSIUM CHLORIDE CRYS ER 20 MEQ PO TBCR
40.0000 meq | EXTENDED_RELEASE_TABLET | Freq: Three times a day (TID) | ORAL | Status: AC
  Administered 2016-09-08 (×2): 40 meq via ORAL
  Filled 2016-09-08 (×2): qty 2

## 2016-09-08 MED ORDER — FUROSEMIDE 10 MG/ML IJ SOLN: 60.0000 mg | Freq: Once | INTRAMUSCULAR | Status: DC

## 2016-09-08 NOTE — Consult Note (Signed)
Radiation Oncology         [phone] ________________________________  Initial Inpatient Consult  Name: Carolyn [name]        MRN: [id number]  Date of Service: 09/05/2016 DOB: [birth year]  KT:[id number], No Pcp Per  No ref. provider found     REFERRING PHYSICIAN: No ref. provider found   DIAGNOSIS: The primary encounter diagnosis was AKI (acute kidney injury) ([place]). 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   HISTORY OF PRESENT ILLNESS: Carolyn [name] is a 46 y.o. female seen at the request of Dr. [name].  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  She is followed by Dr. [name] and has been maintained on Anastrozole and Ibrance. She has previously received 2 courses of SRS to brain lesions in the left and right parietal lobes with her most recent treatment occurring on 12/23/2015. Her most recent brain MRI on 07/13/16 revealed stability of the previously treated lesions and no new brain metastasis or intracranial abnormalities identified. She received palliative postop radiotherapy to the thoracic spine at T8 in April 2016. She had continued to do well since this time regarding her back pain  despite having known multiple sites of osseous metastases in the thoracic and lumbar spine.  However, more recently on 09/05/2016 she developed severe low back pain radiating down the left leg with associated nausea and difficulty ambulating. She presented to the emergency room at [facility] for evaluation and  MRI of the thoracic and lumbar spine revealed worsening metastatic disease in the lumbar spine without evidence of cord compression. Notable, there is anterior epidural disease at T5 results in moderate canal stenosis, lateral recess effacement with contact upon descending S1 nerve roots felt most likely to be contributing to her presenting symptoms. CT of the left hip was obtained due to complaints of left hip and buttock pain, more pronounced when sitting upright in a chair.  This revealed multiple osseous metastatic lesions about the left hip with a 13 mm lesion in the posterior ischium and a 12 mm predominantly lytic lesion in the left sacral ala which correspond with the patient's pain.  She reports that the pain is currently much better controlled on Percocet and with the recent addition of methadone. She was hoping to be discharged home today however her kidney function labs are abnormal thus requiring her to remain in the hospital for further observation.   We have been consulted for consideration of palliative radiotherapy to the lumbar spine to help in managing her pain.    PREVIOUS RADIATION THERAPY: Yes- 12/23/2015 SRS Treatment: PTV2 Right parietal was treated to 20 Gy in 1 fraction.  04/29/2014 SRS Treatment:  PTV1 Left parietal lesion to a dose of 20 gray in 1 fraction  04/27/14-05/08/14:  Palliative radiotherapy to the thoracic spine 30 Gy in 10 fractions  11/08/2011-12/26/2011: Right chest  wall / 50.4 Gray @ 1.8 Gray per fraction x 28 fractions Right Supraclavicular fossa / 45 Gray '@1' .8 Gray per fraction x 25 fractions Right scar / 10 Gray at [employer] per  fraction x 5 fractions  PAST MEDICAL HISTORY:  Past Medical History:  Diagnosis Date  . Breast cancer ([place]) 09/2011   invasive ductal carcinoma metastatic ca in 3/14 lymph nodes  . History of chemotherapy 06/27/11 -09/04/11   neoadjuvant  . Hx of radiation therapy 11/08/11 -12/26/11   right chest wall/supraclav fossa, right scar  . S/P radiation therapy 05/08/14   SRS brain       PAST SURGICAL HISTORY: Past Surgical History:  Procedure Laterality Date  . brain surgery-LITT Left 11/20/14   LITT procedure for Lt brain met.  Marland Kitchen BREAST BIOPSY     Left  . CHOLECYSTECTOMY N/A 03/01/2014   Procedure: LAPAROSCOPIC CHOLECYSTECTOMY;  Surgeon: Leighton Ruff, MD;  Location: [facility];  Service: General;  Laterality: N/A;  . LAMINECTOMY N/A 04/13/2014   Procedure: THORACIC LAMINECTOMY WITH FIXATION THORACIC SIX-THORACIC TEN FUSION;  Surgeon: Kristeen Miss, MD;  Location: [place] [facility];  Service: Neurosurgery;  Laterality: N/A;  . MODIFIED MASTECTOMY  10/03/2011   Procedure: MODIFIED MASTECTOMY;  Surgeon: Haywood Lasso, MD;  Location: [place];  Service: General;  Laterality: Right;  . PORT-A-CATH REMOVAL  01/30/2012   Procedure: MINOR REMOVAL PORT-A-CATH;  Surgeon: Haywood Lasso, MD;  Location: [place];  Service: General;  Laterality: Left;  . PORTACATH PLACEMENT  06/20/2011   Procedure: INSERTION PORT-A-CATH;  Surgeon: Haywood Lasso, MD;  Location: [place];  Service: General;  Laterality: N/A;  . ROBOTIC ASSISTED TOTAL HYSTERECTOMY WITH BILATERAL SALPINGO OOPHERECTOMY Bilateral 12/23/2012   Procedure: ROBOTIC ASSISTED TOTAL HYSTERECTOMY WITH BILATERAL SALPINGO OOPHORECTOMY;  Surgeon: Marvene Staff, MD;  Location: [place] [facility];  Service: Gynecology;  Laterality: Bilateral;  . WISDOM TOOTH EXTRACTION       FAMILY HISTORY:  Family History  Problem Relation Age of Onset  . Breast cancer Maternal Aunt 5  . Pancreatic cancer Maternal Grandfather   .  Pancreatic cancer Maternal Aunt        died in her 44s  . Leukemia Maternal Aunt        died in her 77s  . Ovarian cancer Cousin 61       maternal cousin     SOCIAL HISTORY:  reports that she has never smoked. She has never used smokeless tobacco. She reports that she does not drink alcohol or use drugs.   ALLERGIES: Allegra [fexofenadine]; Cat hair extract; Shellfish allergy; and Glucosamine   MEDICATIONS:  Current Facility-Administered Medications  Medication Dose Route Frequency Provider Last Rate Last Dose  . acetaminophen (TYLENOL) tablet 650 mg  650 mg Oral Q6H PRN Rise Patience, MD   650 mg at 09/08/16 0602   Or  . acetaminophen (TYLENOL) suppository 650 mg  650 mg Rectal Q6H PRN Rise Patience, MD      . docusate sodium (COLACE) capsule 200 mg  200 mg Oral Daily Magrinat, Virgie Dad, MD   200 mg at 09/08/16 0857  . exemestane (AROMASIN) tablet 25 mg  25 mg Oral QPC breakfast Magrinat, Virgie Dad, MD   25 mg at 09/08/16 0857  . furosemide (LASIX) injection 40 mg  40 mg Intravenous BID Reyne Dumas, MD      . heparin injection 5,000 Units  5,000 Units Subcutaneous Q8H Rise Patience, MD   5,000  Units at 09/08/16 0445  . hydrALAZINE (APRESOLINE) injection 10 mg  10 mg Intravenous Q4H PRN Rise Patience, MD      . MEDLINE mouth rinse  15 mL Mouth Rinse BID Reyne Dumas, MD   15 mL at 09/08/16 1000  . methadone (DOLOPHINE) tablet 2.5 mg  2.5 mg Oral Q8H Magrinat, Virgie Dad, MD   2.5 mg at 09/08/16 0445  . ondansetron (ZOFRAN) tablet 4 mg  4 mg Oral Q6H PRN Rise Patience, MD       Or  . ondansetron [facility]) injection 4 mg  4 mg Intravenous Q6H PRN Rise Patience, MD      . oxyCODONE-acetaminophen (PERCOCET/ROXICET) 5-325 MG per tablet 1-2 tablet  1-2 tablet Oral Q6H PRN Reyne Dumas, MD   1 tablet at 09/08/16 0445  . polyethylene glycol (MIRALAX / GLYCOLAX) packet 17 g  17 g Oral Daily Magrinat, Virgie Dad, MD   17 g at 09/08/16 1000  . potassium  chloride SA (K-DUR,KLOR-CON) CR tablet 40 mEq  40 mEq Oral TID Reyne Dumas, MD      . sodium chloride 0.225 % with sodium bicarbonate 50 mEq infusion   Intravenous Continuous Reyne Dumas, MD         REVIEW OF SYSTEMS: On review of systems, the patient reports that she is doing well overall with pain much better controlled at this time. She denies any chest pain, shortness of breath, cough, fevers, chills, night sweats, unintended weight changes. She has not had any new or unusual headaches, changes in visual or auditory acuity, dizziness, or imbalance. She denies difficulty with word finding or speech. She denies abdominal pain, nausea or vomiting. She denies bowel or bladder incontinence. She reports low back pain that shoots down the left leg and is worse with sitting and walking.  She denies numbness or tingling in the left lower extremity. She ambulates with the assistance of a cane and denies any recent falls. She denies any new musculoskeletal or joint aches or pains. A complete review of systems is obtained and is otherwise negative.  PHYSICAL EXAM:  Wt Readings from Last 3 Encounters:  09/06/16 130 lb 1.1 oz (59 kg)  09/05/16 133 lb (60.3 kg)  08/01/16 133 lb 14.4 oz (60.7 kg)   Temp Readings from Last 3 Encounters:  09/08/16 99.4 F (37.4 C) (Oral)  08/01/16 98.2 F (36.8 C) (Oral)  07/18/16 97.8 F (36.6 C) (Oral)   BP Readings from Last 3 Encounters:  09/08/16 125/75  08/01/16 136/66  07/18/16 131/81   Pulse Readings from Last 3 Encounters:  09/08/16 91  08/01/16 86  07/18/16 91   Pain Assessment Pain Score: 4 /10  In general this is a well appearing African-American female in no acute distress. She is alert and oriented x4 and appropriate throughout the examination. HEENT reveals that the patient is normocephalic, atraumatic. EOMs are intact. PERRLA. Skin is intact without any evidence of gross lesions. Cardiovascular exam reveals a regular rate and rhythm, no clicks  rubs or murmurs are auscultated. Chest is clear to auscultation bilaterally. Lymphatic assessment is performed and does not reveal any adenopathy in the cervical, supraclavicular, axillary, or inguinal chains. Abdomen has active bowel sounds in all quadrants and is intact. The abdomen is soft, non tender, non distended. Lower extremities are negative for pretibial pitting edema, deep calf tenderness, cyanosis or clubbing.There is tenderness to deep palpation over the left ischium and sacral region. She has full range of motion of the  left hip without pain. Her strength is 5/5 in the LLE with resisted flexion and extension of the left hip as well as dorsiflexion and plantar flexion.  Strength is 5/5 in the RLE. Sensation is intact to light touch in bilateral lower extremities.   ECOG = 1  0 - Asymptomatic (Fully active, able to carry on all predisease activities without restriction)  1 - Symptomatic but completely ambulatory (Restricted in physically strenuous activity but ambulatory and able to carry out work of a light or sedentary nature. For example, light housework, office work)  2 - Symptomatic, <50% in bed during the day (Ambulatory and capable of all self care but unable to carry out any work activities. Up and about more than 50% of waking hours)  3 - Symptomatic, >50% in bed, but not bedbound (Capable of only limited self-care, confined to bed or chair 50% or more of waking hours)  4 - Bedbound (Completely disabled. Cannot carry on any self-care. Totally confined to bed or chair)  5 - Death   Eustace Pen MM, Creech RH, Tormey DC, et al. [phone]). "Toxicity and response criteria of the [facility]". [place] Oncol. 5 (6): 649-55    LABORATORY DATA:  Lab Results  Component Value Date   WBC 4.2 09/08/2016   HGB 8.6 (L) 09/08/2016   HCT 25.3 (L) 09/08/2016   MCV 86.6 09/08/2016   PLT 105 (L) 09/08/2016   Lab Results  Component Value Date   NA 141 09/08/2016   K  3.9 09/08/2016   CL 101 09/08/2016   CO2 33 (H) 09/08/2016   Lab Results  Component Value Date   ALT 13 (L) 09/08/2016   AST 32 09/08/2016   ALKPHOS 108 09/08/2016   BILITOT 0.5 09/08/2016      RADIOGRAPHY: Dg Chest 2 View  Result Date: 09/05/2016 CLINICAL DATA:  Lumbar pain radiating to the legs. EXAM: CHEST  2 VIEW COMPARISON:  June 17, 2014 FINDINGS: There is fullness of the left suprahilar region. The heart size normal. There is no focal infiltrate, pulmonary edema, or pleural effusion P Spinal rods are noted unchanged. Prior cholecystectomy clips are noted. IMPRESSION: Fullness of left suprahilar region. Further evaluation with a chest CT on outpatient basis is recommended to exclude mass. Electronically Signed   By: Abelardo Diesel M.D.   On: 09/05/2016 19:48   Mr Thoracic Spine W Wo Contrast  Result Date: 09/05/2016 CLINICAL DATA:  46 y/o  F; 329, image 5 EXAM: MRI THORACIC AND LUMBAR SPINE WITHOUT AND WITH CONTRAST TECHNIQUE: Multiplanar and multiecho pulse sequences of the thoracic and lumbar spine were obtained without and with intravenous contrast. CONTRAST:  58m MULTIHANCE GADOBENATE DIMEGLUMINE 529 MG/ML IV SOLN COMPARISON:  04/06/2016, 01/14/2016, 05/03/2015 bone scan. 04/12/2014 lumbar and thoracic spine MRI FINDINGS: MRI THORACIC SPINE FINDINGS Alignment:  Physiologic. Vertebrae: T6-T10 posterior instrumented fusion and T8 laminectomy. Susceptibility artifact from the fusion hardware partially obscures the vertebral bodies at those levels. Stable moderate compression deformity of the T8 vertebral body. Vertebral body heights are otherwise well-preserved. There is heterogeneous bone marrow signal within the T7, T8, T9, and T12 vertebral bodies corresponding to prior enhancing lesions without definite enhancement on the current study, probably representing treated metastasis. Mild enhancement within the posterior T11 and posterior T12 vertebral bodies consistent with residual  metastasis. Additionally, there is enhancement within the T8 vertebral body greater on the right and extending into the pedicle compatible with residual metastasis. Cord: No abnormal cord signal or enhancement.  The spinal cord compression. No epidural disease identified. Paraspinal and other soft tissues: Subcentimeter pulmonary nodules in the right lung base (series 9, image 34) and indeterminate T2 hyperintense lesions in the right lobe of liver (series 9, image 47). Disc levels: No significant disc displacement or canal stenosis. MRI LUMBAR SPINE FINDINGS Segmentation:  Standard. Alignment:  Physiologic. Vertebrae: The diffusely heterogeneous lumbar spine and pelvis bone marrow signal with enhancement compatible with osseous metastatic disease. Metastatic disease is most pronounced at the T5 vertebral body with a for epidural disease effaces the anterior thecal space and enters the bilateral neural foramen greater on the left. No loss of vertebral body height. Conus medullaris: Extends to the L1-2 level and appears normal. No abnormal enhancement. Paraspinal and other soft tissues: Negative. Disc levels: No significant disc displacement. Mild lower lumbar facet hypertrophy. Anterior epidural disease arising from the L5 vertebral body mildly narrows the spinal canal, effaces the lateral recesses with contact upon descending bilateral S1 nerve roots, moderately narrows the left and mildly narrows the right neural foramen. IMPRESSION: MRI thoracic spine: 1. Diffuse osseous metastasis with residual enhancing disease visualized at the T8, T11, and T12 vertebral body levels. No epidural disease. 2. Stable moderate compression deformity of T8 vertebral body. No new loss of vertebral body height. 3. Subcentimeter pulmonary nodules and indeterminate liver lesions may represent metastasis. MRI lumbar spine: 1. Diffuse osseous metastasis of lumbar spine and visible pelvis. 2. No loss of vertebral body height. 3. Anterior  epidural disease at T5 results in moderate canal stenosis, lateral recess effacement with contact upon descending S1 nerve roots, and moderate left/mild right neural foraminal stenosis. Electronically Signed   By: Kristine Garbe M.D.   On: 09/05/2016 19:42   Mr Lumbar Spine W Wo Contrast  Result Date: 09/05/2016 CLINICAL DATA:  46 y/o  F; 329, image 5 EXAM: MRI THORACIC AND LUMBAR SPINE WITHOUT AND WITH CONTRAST TECHNIQUE: Multiplanar and multiecho pulse sequences of the thoracic and lumbar spine were obtained without and with intravenous contrast. CONTRAST:  48m MULTIHANCE GADOBENATE DIMEGLUMINE 529 MG/ML IV SOLN COMPARISON:  04/06/2016, 01/14/2016, 05/03/2015 bone scan. 04/12/2014 lumbar and thoracic spine MRI FINDINGS: MRI THORACIC SPINE FINDINGS Alignment:  Physiologic. Vertebrae: T6-T10 posterior instrumented fusion and T8 laminectomy. Susceptibility artifact from the fusion hardware partially obscures the vertebral bodies at those levels. Stable moderate compression deformity of the T8 vertebral body. Vertebral body heights are otherwise well-preserved. There is heterogeneous bone marrow signal within the T7, T8, T9, and T12 vertebral bodies corresponding to prior enhancing lesions without definite enhancement on the current study, probably representing treated metastasis. Mild enhancement within the posterior T11 and posterior T12 vertebral bodies consistent with residual metastasis. Additionally, there is enhancement within the T8 vertebral body greater on the right and extending into the pedicle compatible with residual metastasis. Cord: No abnormal cord signal or enhancement. The spinal cord compression. No epidural disease identified. Paraspinal and other soft tissues: Subcentimeter pulmonary nodules in the right lung base (series 9, image 34) and indeterminate T2 hyperintense lesions in the right lobe of liver (series 9, image 47). Disc levels: No significant disc displacement or canal  stenosis. MRI LUMBAR SPINE FINDINGS Segmentation:  Standard. Alignment:  Physiologic. Vertebrae: The diffusely heterogeneous lumbar spine and pelvis bone marrow signal with enhancement compatible with osseous metastatic disease. Metastatic disease is most pronounced at the T5 vertebral body with a for epidural disease effaces the anterior thecal space and enters the bilateral neural foramen greater on the left. No loss of  vertebral body height. Conus medullaris: Extends to the L1-2 level and appears normal. No abnormal enhancement. Paraspinal and other soft tissues: Negative. Disc levels: No significant disc displacement. Mild lower lumbar facet hypertrophy. Anterior epidural disease arising from the L5 vertebral body mildly narrows the spinal canal, effaces the lateral recesses with contact upon descending bilateral S1 nerve roots, moderately narrows the left and mildly narrows the right neural foramen. IMPRESSION: MRI thoracic spine: 1. Diffuse osseous metastasis with residual enhancing disease visualized at the T8, T11, and T12 vertebral body levels. No epidural disease. 2. Stable moderate compression deformity of T8 vertebral body. No new loss of vertebral body height. 3. Subcentimeter pulmonary nodules and indeterminate liver lesions may represent metastasis. MRI lumbar spine: 1. Diffuse osseous metastasis of lumbar spine and visible pelvis. 2. No loss of vertebral body height. 3. Anterior epidural disease at T5 results in moderate canal stenosis, lateral recess effacement with contact upon descending S1 nerve roots, and moderate left/mild right neural foraminal stenosis. Electronically Signed   By: Kristine Garbe M.D.   On: 09/05/2016 19:42   US Renal  Result Date: 09/06/2016 CLINICAL DATA:  Left-sided flank pain, acute renal failure EXAM: RENAL / URINARY TRACT ULTRASOUND COMPLETE COMPARISON:  12/16/2014 FINDINGS: Right Kidney: Length: 11.2 cm. Echogenicity within normal limits. No mass or  hydronephrosis visualized. Left Kidney: Length: 11.6 cm. Echogenicity within normal limits. No mass or hydronephrosis visualized. Bladder: Appears normal for degree of bladder distention. Note is made of a small left-sided pleural effusion. IMPRESSION: No acute renal abnormality is noted. Small left pleural effusion is noted. Electronically Signed   By: Inez Catalina M.D.   On: 09/06/2016 07:37   Ct Hip Left Wo Contrast  Result Date: 09/06/2016 CLINICAL DATA:  Left hip pain.  History of metastatic breast cancer. EXAM: CT OF THE LEFT HIP WITHOUT CONTRAST TECHNIQUE: Multidetector CT imaging of the left hip was performed according to the standard protocol. Multiplanar CT image reconstructions were also generated. COMPARISON:  Bone scan dated April 06, 2016. CT abdomen pelvis dated December 16, 2014. FINDINGS: Bones/Joint/Cartilage Several pericentimeter mixed lytic and sclerotic lesions are seen in the left hemipelvis: A 13 mm lesion at the left puboacetabular junction, a 13 mm lesion in the posterior ischium, and a 13 mm lesion in the parasymphyseal left pubic bone. Partially visualized 12 mm predominately lytic lesion in the left sacral ala. Additional small subcentimeter lytic lesions are seen in the posterior ischium (series 2, image 21), left iliac wing (series 4, image 16), and the left acetabulum (series 4, image 26). These lesions correspond to areas of increased uptake on the prior bone scan, but were not present on prior CT from December 2016. No lesion is seen in the left proximal femur. No acute fracture or malalignment. The left hip joint space is relatively preserved. Ligaments Suboptimally assessed by CT. Muscles and Tendons No focal abnormality. Soft tissues Unremarkable. 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 No fracture. Electronically Signed   By: Titus Dubin M.D.   On: 09/06/2016 15:09   Nm Pulmonary Vent And Perf (v/q Scan)  Result Date: 09/05/2016 CLINICAL  DATA:  Lumbar pain, positive D-dimer EXAM: NUCLEAR MEDICINE VENTILATION - PERFUSION LUNG SCAN TECHNIQUE: Ventilation images were obtained in multiple projections using inhaled aerosol [id number]. Perfusion images were obtained in multiple projections after intravenous injection of Tc-942mAA. RADIOPHARMACEUTICALS:  32 mCi Technetium-9976mPA aerosol inhalation and 3.94 mCi Technetium-74m15m IV COMPARISON:  Chest x-ray 09/05/2016 FINDINGS: Ventilation: Heterogenous ventilation  within the upper lobes. Central airways clumping of activity. Perfusion: Heterogenous perfusion. Multiple small relatively peripheral filling defects within the upper lobes similar in distribution to heterogeneous ventilation. IMPRESSION: Heterogenous profusion with multiple small matched defects in the upper lobes. Overall low probability for pulmonary embolus. Electronically Signed   By: Donavan Foil M.D.   On: 09/05/2016 22:03       IMPRESSION/PLAN: 1. 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   Recent MRI of the thoracolumbar spine and CT of the left hip demonstrate disease progression with anterior epidural disease at T5 resulting in moderate canal stenosis, lateral recess effacement with contact upon descending S1 nerve roots felt most likely to be contributing to her presenting symptoms. Additionally, there are multiple osseous metastatic lesions about the left hip with a 13 mm lesion in the posterior ischium and a 12 mm predominantly lytic lesion in the left sacral ala which correspond with the patient's symptoms. Today, I talked to the patient about the findings and workup thus far. We discussed the natural history of metastatic carcinoma and general treatment, highlighting the role of palliative radiotherapy in the management. We discussed the available radiation techniques, and focused on the details of logistics and delivery. We  reviewed the anticipated acute and late sequelae associated with radiation in this setting. She has had an excellent response to radiotherapy in the past.  Her case has been discussed and reviewed with Dr. Lisbeth Renshaw who recommends a course of 10 treatments to 30 Gy targeting the L5 lesion as well as the lesions in the left posterior ischium and sacral ala. The patient was encouraged to ask questions that were answered to her satisfaction. She would like to proceed with palliative radiotherapy and has been scheduled for [facility] on Tuesday, Sept. 4th at 10am.  She anticipates being discharged home over the weekend pending improvement of her renal function.     Nicholos Johns, PA-C

## 2016-09-08 NOTE — Progress Notes (Signed)
SHANTELL BELONGIA   DOB:[birth year]   XK#:[id number]   HFW#:[id number]  Subjective:  Carolyn [name] is having some nausea today. She started methadone at 2.5 mg Q8h yesterday with good tolerance so far (unless nausea is die to the med). She says her pain is better. Is getting OOB some--wants to walk. Still has sciatica-like pain down the left leg. Rad Onc consult pending. No family in room   Objective: middle aged African American womanexamined in bed Vitals:   09/07/16 2231 09/08/16 0521  BP: 126/73 125/75  Pulse: 99 91  Resp: 18 18  Temp: 98.8 F (37.1 C) 99.4 F (37.4 C)  SpO2: 93% 93%    Body mass index is 19.78 kg/m.  Intake/Output Summary (Last 24 hours) at 09/08/16 0804 Last data filed at 09/08/16 0436  Gross per 24 hour  Intake             2300 ml  Output             3551 ml  Net            -1251 ml   Sclerae unicteric, EOMs intact Oropharynx clear, slightly dry Lungs no rales or rhonchi, auscultated anterolaterally Heart regular rate and rhythm Abd soft, nontender, positive bowel sounds Neuro: nonfocal, well oriented, positive affect Breasts: Deferred   CBG (last 3)  No results for input(s): GLUCAP in the last 72 hours.   Labs:  Lab Results  Component Value Date   WBC 4.2 09/08/2016   HGB 8.6 (L) 09/08/2016   HCT 25.3 (L) 09/08/2016   MCV 86.6 09/08/2016   PLT 105 (L) 09/08/2016   NEUTROABS 3.2 09/05/2016    '@LASTCHEMISTRY' @  Urine Studies No results for input(s): UHGB, CRYS in the last 72 hours.  Invalid input(s): UACOL, UAPR, USPG, UPH, UTP, UGL, UKET, UBIL, UNIT, UROB, [place], UEPI, UWBC, [place], [place], [place], [place], [place]  Basic Metabolic Panel:  Recent Labs Lab 09/05/16 1644 09/06/16 0000 09/06/16 0542 09/07/16 0405 09/08/16 0418  NA 142  --  144 143 141  K 2.9*  --  4.0 3.1* 3.9  CL 96*  --  107 101 101  CO2 37*  --  30 34* 33*  GLUCOSE 122*  --  90 93 93  BUN 42*  --  35* 32* 29*  CREATININE 3.68* 3.61* 3.12* 2.60* 2.27*  CALCIUM 14.5*  --  12.4* 11.7*  10.8*   GFR Estimated Creatinine Clearance: 28.5 mL/min (A) (by C-G formula based on SCr of 2.27 mg/dL (H)). Liver Function Tests:  Recent Labs Lab 09/05/16 1644 09/07/16 0405 09/08/16 0418  AST 35 41 32  ALT 14 12* 13*  ALKPHOS 116 99 108  BILITOT 0.6 0.5 0.5  PROT 7.3 6.0* 6.1*  ALBUMIN 4.1 3.3* 3.1*   No results for input(s): LIPASE, AMYLASE in the last 168 hours. No results for input(s): AMMONIA in the last 168 hours. Coagulation profile No results for input(s): INR, PROTIME in the last 168 hours.  CBC:  Recent Labs Lab 09/05/16 1644 09/06/16 0000 09/06/16 0542 09/07/16 0405 09/08/16 0418  WBC 4.7 4.3 3.5* 4.4 4.2  NEUTROABS 3.2  --   --   --   --   HGB 8.5* 7.4* 7.0* 8.3* 8.6*  HCT 25.4* 21.7* 20.8* 24.5* 25.3*  MCV 90.7 90.8 89.7 86.9 86.6  PLT 124* 113* 93* 104* 105*   Cardiac Enzymes: No results for input(s): CKTOTAL, CKMB, CKMBINDEX, TROPONINI in the last 168 hours. BNP: Invalid input(s): POCBNP CBG: No results for  input(s): GLUCAP in the last 168 hours. D-Dimer  Recent Labs  09/05/16 1644  DDIMER 7.57*   Hgb A1c No results for input(s): HGBA1C in the last 72 hours. Lipid Profile No results for input(s): CHOL, HDL, LDLCALC, TRIG, CHOLHDL, LDLDIRECT in the last 72 hours. Thyroid function studies No results for input(s): TSH, T4TOTAL, T3FREE, THYROIDAB in the last 72 hours.  Invalid input(s): FREET3 Anemia work up No results for input(s): VITAMINB12, FOLATE, FERRITIN, TIBC, IRON, RETICCTPCT in the last 72 hours. Microbiology No results found for this or any previous visit (from the past 240 hour(s)).    Studies:  Ct Hip Left Wo Contrast  Result Date: 09/06/2016 CLINICAL DATA:  Left hip pain.  History of metastatic breast cancer. EXAM: CT OF THE LEFT HIP WITHOUT CONTRAST TECHNIQUE: Multidetector CT imaging of the left hip was performed according to the standard protocol. Multiplanar CT image reconstructions were also generated. COMPARISON:   Bone scan dated April 06, 2016. CT abdomen pelvis dated December 16, 2014. FINDINGS: Bones/Joint/Cartilage Several pericentimeter mixed lytic and sclerotic lesions are seen in the left hemipelvis: A 13 mm lesion at the left puboacetabular junction, a 13 mm lesion in the posterior ischium, and a 13 mm lesion in the parasymphyseal left pubic bone. Partially visualized 12 mm predominately lytic lesion in the left sacral ala. Additional small subcentimeter lytic lesions are seen in the posterior ischium (series 2, image 21), left iliac wing (series 4, image 16), and the left acetabulum (series 4, image 26). These lesions correspond to areas of increased uptake on the prior bone scan, but were not present on prior CT from December 2016. No lesion is seen in the left proximal femur. No acute fracture or malalignment. The left hip joint space is relatively preserved. Ligaments Suboptimally assessed by CT. Muscles and Tendons No focal abnormality. Soft tissues Unremarkable. 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 No fracture. Electronically Signed   By: Titus Dubin M.D.   On: 09/06/2016 15:09    Assessment: 46 y.o. 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   (3)Treated neoadjuvantly with cyclophosphamide  and docetaxel x4 completed 08/29/2011.  (4) status post right modified radical mastectomy 10/03/2011 showing a residual pT1c pN1a (3/18 lymph nodes positive) invasive ductal carcinoma, grade 1,estrogen receptor 100% positive, progesterone receptor negative, with no HER-2 amplification ([id number])  (5) adjuvant radiation to the right chest wall, right supraclavicular fossa and right scar completed 12/26/2011  (6) tamoxifen started January 2014-- discontinued April 2016 with evidence of metastatic disease  (7) status post total hysterectomy with bilateral salpingo-oophorectomy 12/23/2012 with benign pathology ([id number])  METASTATIC DISEASE 04/13/2014 (8) presented with T8 cord compression and underwent laminectomy 04/13/2014 with T8 decompression of spinal cord, posterior fixation from T6-T10 with pedicle screws and rods, posterior arthrodesis with allograft. 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 ExacTrac Snap verification was performed for each couch angle.              (b) radiation to the T-spine completed 05/08/2014.             (c) [facility]             (d) SRS to 0.4 cm left parietal lesion 12/23/2015  (11) started fulvestrant 05/18/2014 and Palbociclib 06/01/2014             (a) Palbociclib dose decreased to 100 mg daily, 21/7 as of 08/05/2014             (b) palbociclib dose decreased to 75 mg daily,  21/7, starting May 2017             (c) palbociclib dose decreased to 75 mg every other day beginning 07/26/2015               (d) palbociclib held for month of October because of persistent low counts, resumed November             (e) fulvestrant discontinued after 11/02/2016 dose: Anastrozole started 08/01/2016  (f) switched to exemestane as of August 2018, everolimus to be added   (12) started zolendronate 05/18/2014, repeated every 12 weeks             (a) July 2017 zolendronate dose held because of dental issues, resumed 10/18/2015             (b) switched to denosumab as of 01/20/2016 with concern of bony progression on zolendronate             (c) switched back to zolendronate as of April 2018 at patient's request  (d) denosumab/xgeva resumed 09/08/2016 with hypercalcemia  (13) most recent staging studies:             (a) brain MRI 07/13/2016 shows stable to smaller treated lesions.             (b) bone scan 04/06/2016 shows no new lesions             (c) chest CT scan 04/06/2016 shows small bilateral pulmonary nodules with no evidence of disease progression  (d) brain MRI stable to slightly improved  (14) hypercalcemia and increased bone pain 09/05/2016 c/w progression bone involvement  (a) s/p pamidronate 09/05/2016 (note received zolendronate 08/01/2016)  (b) xgeva resumed 09/08/2016, to be repeated Q4W  Plan:  Aileena's labs are still not where we want them-- creat still >2, Ca++ still greater than 10. I have resumed IVF (ordr had expired) and written for a dose of denosumab/xgeva. Hopefully within the next 48 hrs she may be sufficiently improved to go home.  Consult to rad onc placed--hopefully she  can be seen to day  She is tolerating the methadone at 2.5 mg TID well, with some improvement in pain. C/O some nausea this AM--not clear if due to the medication. If so may need to switch to tramadol 100 mg QID. Otherwise if she tolerates 2.5 mg well can consider upping dose to 5  mg TID--goal is sufficient pain control that she can function more normally.  I will be out of town until 09/04. If she is discharged over the weekend I will see her 09/07. Plan restaging studies as outpatient. For the cancer she is already on exemestane and we will add everolimus as outpatient  Thank you again for your help to this patient!   Chauncey Cruel, MD 09/08/2016  8:04 AM Medical Oncology and Hematology [facility] [address] [place], [place] [address] Tel. [phone]    Fax. [phone]

## 2016-09-08 NOTE — Progress Notes (Signed)
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 female with with history of metastatic breast cancer presents to the ER with complaints of low back pain radiating to her left lower extremity.  In the ER Carolyn [name] had MRI of the T and L-spine which shows progressive metastasis. On exam Carolyn [name] is able to move her extremities. Labs revealed severe hypercalcemia with calcium around 14.5 and creatinine   worsened to 3.6 from almost normal last month. EKG shows normal sinus rhythm with QTC of 383 ms and QRS of 83 ms. Carolyn [name] admitted for severe electrolyte abnormalities including hypocalcemia, hypokalemia, a acute kidney injury  Assessment and plan 1. Severe hypercalcemia - likely related to metastatic cancer. We'll hold off her calcium supplements and vitamin D supplements. Carolyn [name] has received aggressive IV fluids, continue normal saline with bicarb  at 150 mL/h. Given   pamidronate   60 mg IV 1 dose now. Carolyn [name] also started on diuresis with IV Lasix to expedite resolution of hypercalcemia. Will continue Lasix 40 mg IV twice a day   2 doses. Check RS calcium tomorrow 2. Low back pain/left hip pain likely related to Carolyn [name]'s metastatic cancer   -Carolyn [name] has been evaluated by Dr. [name], may need radiation oncology to consider radiation for pain control. Started on methadone by oncology 3. Hypokalemia likely could be from poor oral intake - Lasix, replete and recheck 4. Acute renal failure - cause not clear could be prerenal from poor oral intake. Likely from elevated calcium, uric acid. Continue IV hydration, NSAIDs discontinued. Continue IV fluids 5. Anemia  likely from malignancy related - follow CBC. Hemoglobin 7.0 . Status post 1 unit of packed red blood cells, hemoglobin stable posttransfusion 6. Metastatic breast cancer-may need restaging with CT chest and bone scan. Appreciate oncology input.   DVT prophylaxsis heparin  Code Status:  Full code  Family Communication: Discussed in detail with the Carolyn [name], all imaging results, lab results explained to the Carolyn [name]   Disposition Plan:  Continue to manage her acute medical issues, not stable for discharge     Consultants:  Hematology oncology             Radiation oncology   Procedures:  None  Antibiotics: Anti-infectives    None         HPI/Subjective: Carolyn [name] denies any severe pain today in her left hip, minimal back pain, Nauseous this am , has not had a BM today  Objective: Vitals:   09/07/16 0603 09/07/16 1602 09/07/16 2231 09/08/16 0521  BP: 136/71  126/73 125/75  Pulse: 94  99 91  Resp: 16  18 18   Temp: 98.4 F (36.9 C)  98.8 F (37.1 C) 99.4 F (37.4 C)  TempSrc: Oral  Oral Oral  SpO2: 99% 95% 93% 93%  Weight:      Height:        Intake/Output Summary (Last 24 hours) at 09/08/16 1030 Last data filed at 09/08/16 0436  Gross per 24 hour  Intake             2300 ml  Output  3551 ml  Net            -1251 ml    Exam:  Examination:  General exam: Appears calm and comfortable  Respiratory system: Clear to auscultation. Respiratory effort normal. Cardiovascular system: S1 & S2 heard, RRR. No JVD, murmurs, rubs, gallops or clicks. No pedal edema. Gastrointestinal system: Abdomen is nondistended, soft and nontender. No organomegaly or masses felt. Normal bowel sounds heard. Central nervous system: Alert and oriented. No focal neurological deficits. Extremities: Symmetric 5 x 5 power. Skin: No rashes, lesions or ulcers Psychiatry: Judgement and insight appear normal. Mood & affect appropriate.     Data Reviewed: I have personally reviewed  following labs and imaging studies  Micro Results No results found for this or any previous visit (from the past 240 hour(s)).  Radiology Reports Dg Chest 2 View  Result Date: 09/05/2016 CLINICAL DATA:  Lumbar pain radiating to the legs. EXAM: CHEST  2 VIEW COMPARISON:  June 17, 2014 FINDINGS: There is fullness of the left suprahilar region. The heart size normal. There is no focal infiltrate, pulmonary edema, or pleural effusion P Spinal rods are noted unchanged. Prior cholecystectomy clips are noted. IMPRESSION: Fullness of left suprahilar region. Further evaluation with a chest CT on outpatient basis is recommended to exclude mass. Electronically Signed   By: Abelardo Diesel M.D.   On: 09/05/2016 19:48   Mr Thoracic Spine W Wo Contrast  Result Date: 09/05/2016 CLINICAL DATA:  46 y/o  F; 329, image 5 EXAM: MRI THORACIC AND LUMBAR SPINE WITHOUT AND WITH CONTRAST TECHNIQUE: Multiplanar and multiecho pulse sequences of the thoracic and lumbar spine were obtained without and with intravenous contrast. CONTRAST:  81mL MULTIHANCE GADOBENATE DIMEGLUMINE 529 MG/ML IV SOLN COMPARISON:  04/06/2016, 01/14/2016, 05/03/2015 bone scan. 04/12/2014 lumbar and thoracic spine MRI FINDINGS: MRI THORACIC SPINE FINDINGS Alignment:  Physiologic. Vertebrae: T6-T10 posterior instrumented fusion and T8 laminectomy. Susceptibility artifact from the fusion hardware partially obscures the vertebral bodies at those levels. Stable moderate compression deformity of the T8 vertebral body. Vertebral body heights are otherwise well-preserved. There is heterogeneous bone marrow signal within the T7, T8, T9, and T12 vertebral bodies corresponding to prior enhancing lesions without definite enhancement on the current study, probably representing treated metastasis. Mild enhancement within the posterior T11 and posterior T12 vertebral bodies consistent with residual metastasis. Additionally, there is enhancement within the T8 vertebral body  greater on the right and extending into the pedicle compatible with residual metastasis. Cord: No abnormal cord signal or enhancement. The spinal cord compression. No epidural disease identified. Paraspinal and other soft tissues: Subcentimeter pulmonary nodules in the right lung base (series 9, image 34) and indeterminate T2 hyperintense lesions in the right lobe of liver (series 9, image 47). Disc levels: No significant disc displacement or canal stenosis. MRI LUMBAR SPINE FINDINGS Segmentation:  Standard. Alignment:  Physiologic. Vertebrae: The diffusely heterogeneous lumbar spine and pelvis bone marrow signal with enhancement compatible with osseous metastatic disease. Metastatic disease is most pronounced at the T5 vertebral body with a for epidural disease effaces the anterior thecal space and enters the bilateral neural foramen greater on the left. No loss of vertebral body height. Conus medullaris: Extends to the L1-2 level and appears normal. No abnormal enhancement. Paraspinal and other soft tissues: Negative. Disc levels: No significant disc displacement. Mild lower lumbar facet hypertrophy. Anterior epidural disease arising from the L5 vertebral body mildly narrows the spinal canal, effaces the lateral recesses with contact upon descending bilateral S1 nerve roots,  moderately narrows the left and mildly narrows the right neural foramen. IMPRESSION: MRI thoracic spine: 1. Diffuse osseous metastasis with residual enhancing disease visualized at the T8, T11, and T12 vertebral body levels. No epidural disease. 2. Stable moderate compression deformity of T8 vertebral body. No new loss of vertebral body height. 3. Subcentimeter pulmonary nodules and indeterminate liver lesions may represent metastasis. MRI lumbar spine: 1. Diffuse osseous metastasis of lumbar spine and visible pelvis. 2. No loss of vertebral body height. 3. Anterior epidural disease at T5 results in moderate canal stenosis, lateral recess  effacement with contact upon descending S1 nerve roots, and moderate left/mild right neural foraminal stenosis. Electronically Signed   By: Kristine Garbe M.D.   On: 09/05/2016 19:42   Mr Lumbar Spine W Wo Contrast  Result Date: 09/05/2016 CLINICAL DATA:  46 y/o  F; 329, image 5 EXAM: MRI THORACIC AND LUMBAR SPINE WITHOUT AND WITH CONTRAST TECHNIQUE: Multiplanar and multiecho pulse sequences of the thoracic and lumbar spine were obtained without and with intravenous contrast. CONTRAST:  41mL MULTIHANCE GADOBENATE DIMEGLUMINE 529 MG/ML IV SOLN COMPARISON:  04/06/2016, 01/14/2016, 05/03/2015 bone scan. 04/12/2014 lumbar and thoracic spine MRI FINDINGS: MRI THORACIC SPINE FINDINGS Alignment:  Physiologic. Vertebrae: T6-T10 posterior instrumented fusion and T8 laminectomy. Susceptibility artifact from the fusion hardware partially obscures the vertebral bodies at those levels. Stable moderate compression deformity of the T8 vertebral body. Vertebral body heights are otherwise well-preserved. There is heterogeneous bone marrow signal within the T7, T8, T9, and T12 vertebral bodies corresponding to prior enhancing lesions without definite enhancement on the current study, probably representing treated metastasis. Mild enhancement within the posterior T11 and posterior T12 vertebral bodies consistent with residual metastasis. Additionally, there is enhancement within the T8 vertebral body greater on the right and extending into the pedicle compatible with residual metastasis. Cord: No abnormal cord signal or enhancement. The spinal cord compression. No epidural disease identified. Paraspinal and other soft tissues: Subcentimeter pulmonary nodules in the right lung base (series 9, image 34) and indeterminate T2 hyperintense lesions in the right lobe of liver (series 9, image 47). Disc levels: No significant disc displacement or canal stenosis. MRI LUMBAR SPINE FINDINGS Segmentation:  Standard. Alignment:   Physiologic. Vertebrae: The diffusely heterogeneous lumbar spine and pelvis bone marrow signal with enhancement compatible with osseous metastatic disease. Metastatic disease is most pronounced at the T5 vertebral body with a for epidural disease effaces the anterior thecal space and enters the bilateral neural foramen greater on the left. No loss of vertebral body height. Conus medullaris: Extends to the L1-2 level and appears normal. No abnormal enhancement. Paraspinal and other soft tissues: Negative. Disc levels: No significant disc displacement. Mild lower lumbar facet hypertrophy. Anterior epidural disease arising from the L5 vertebral body mildly narrows the spinal canal, effaces the lateral recesses with contact upon descending bilateral S1 nerve roots, moderately narrows the left and mildly narrows the right neural foramen. IMPRESSION: MRI thoracic spine: 1. Diffuse osseous metastasis with residual enhancing disease visualized at the T8, T11, and T12 vertebral body levels. No epidural disease. 2. Stable moderate compression deformity of T8 vertebral body. No new loss of vertebral body height. 3. Subcentimeter pulmonary nodules and indeterminate liver lesions may represent metastasis. MRI lumbar spine: 1. Diffuse osseous metastasis of lumbar spine and visible pelvis. 2. No loss of vertebral body height. 3. Anterior epidural disease at T5 results in moderate canal stenosis, lateral recess effacement with contact upon descending S1 nerve roots, and moderate left/mild right neural foraminal  stenosis. Electronically Signed   By: Kristine Garbe M.D.   On: 09/05/2016 19:42   US Renal  Result Date: 09/06/2016 CLINICAL DATA:  Left-sided flank pain, acute renal failure EXAM: RENAL / URINARY TRACT ULTRASOUND COMPLETE COMPARISON:  12/16/2014 FINDINGS: Right Kidney: Length: 11.2 cm. Echogenicity within normal limits. No mass or hydronephrosis visualized. Left Kidney: Length: 11.6 cm. Echogenicity within  normal limits. No mass or hydronephrosis visualized. Bladder: Appears normal for degree of bladder distention. Note is made of a small left-sided pleural effusion. IMPRESSION: No acute renal abnormality is noted. Small left pleural effusion is noted. Electronically Signed   By: Inez Catalina M.D.   On: 09/06/2016 07:37   Ct Hip Left Wo Contrast  Result Date: 09/06/2016 CLINICAL DATA:  Left hip pain.  History of metastatic breast cancer. EXAM: CT OF THE LEFT HIP WITHOUT CONTRAST TECHNIQUE: Multidetector CT imaging of the left hip was performed according to the standard protocol. Multiplanar CT image reconstructions were also generated. COMPARISON:  Bone scan dated April 06, 2016. CT abdomen pelvis dated December 16, 2014. FINDINGS: Bones/Joint/Cartilage Several pericentimeter mixed lytic and sclerotic lesions are seen in the left hemipelvis: A 13 mm lesion at the left puboacetabular junction, a 13 mm lesion in the posterior ischium, and a 13 mm lesion in the parasymphyseal left pubic bone. Partially visualized 12 mm predominately lytic lesion in the left sacral ala. Additional small subcentimeter lytic lesions are seen in the posterior ischium (series 2, image 21), left iliac wing (series 4, image 16), and the left acetabulum (series 4, image 26). These lesions correspond to areas of increased uptake on the prior bone scan, but were not present on prior CT from December 2016. No lesion is seen in the left proximal femur. No acute fracture or malalignment. The left hip joint space is relatively preserved. Ligaments Suboptimally assessed by CT. Muscles and Tendons No focal abnormality. Soft tissues Unremarkable. 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 No fracture. Electronically Signed   By: Titus Dubin M.D.   On: 09/06/2016 15:09   Nm Pulmonary Vent And Perf (v/q Scan)  Result Date: 09/05/2016 CLINICAL DATA:  Lumbar pain, positive D-dimer EXAM: NUCLEAR MEDICINE VENTILATION -  PERFUSION LUNG SCAN TECHNIQUE: Ventilation images were obtained in multiple projections using inhaled aerosol Tc-23m DTPA. Perfusion images were obtained in multiple projections after intravenous injection of Tc-5m MAA. RADIOPHARMACEUTICALS:  32 mCi Technetium-49m DTPA aerosol inhalation and 3.94 mCi Technetium-25m MAA IV COMPARISON:  Chest x-ray 09/05/2016 FINDINGS: Ventilation: Heterogenous ventilation within the upper lobes. Central airways clumping of activity. Perfusion: Heterogenous perfusion. Multiple small relatively peripheral filling defects within the upper lobes similar in distribution to heterogeneous ventilation. IMPRESSION: Heterogenous profusion with multiple small matched defects in the upper lobes. Overall low probability for pulmonary embolus. Electronically Signed   By: Donavan Foil M.D.   On: 09/05/2016 22:03     CBC  Recent Labs Lab 09/05/16 1644 09/06/16 0000 09/06/16 0542 09/07/16 0405 09/08/16 0418  WBC 4.7 4.3 3.5* 4.4 4.2  HGB 8.5* 7.4* 7.0* 8.3* 8.6*  HCT 25.4* 21.7* 20.8* 24.5* 25.3*  PLT 124* 113* 93* 104* 105*  MCV 90.7 90.8 89.7 86.9 86.6  MCH 30.4 31.0 30.2 29.4 29.5  MCHC 33.5 34.1 33.7 33.9 34.0  RDW 14.7 14.6 14.6 16.5* 16.0*  LYMPHSABS 1.2  --   --   --   --   MONOABS 0.3  --   --   --   --   EOSABS 0.0  --   --   --   --  BASOSABS 0.0  --   --   --   --     Chemistries   Recent Labs Lab 09/05/16 1644 09/06/16 0000 09/06/16 0542 09/07/16 0405 09/08/16 0418  NA 142  --  144 143 141  K 2.9*  --  4.0 3.1* 3.9  CL 96*  --  107 101 101  CO2 37*  --  30 34* 33*  GLUCOSE 122*  --  90 93 93  BUN 42*  --  35* 32* 29*  CREATININE 3.68* 3.61* 3.12* 2.60* 2.27*  CALCIUM 14.5*  --  12.4* 11.7* 10.8*  AST 35  --   --  41 32  ALT 14  --   --  12* 13*  ALKPHOS 116  --   --  99 108  BILITOT 0.6  --   --  0.5 0.5   ------------------------------------------------------------------------------------------------------------------ estimated  creatinine clearance is 28.5 mL/min (A) (by C-G formula based on SCr of 2.27 mg/dL (H)). ------------------------------------------------------------------------------------------------------------------ No results for input(s): HGBA1C in the last 72 hours. ------------------------------------------------------------------------------------------------------------------ No results for input(s): CHOL, HDL, LDLCALC, TRIG, CHOLHDL, LDLDIRECT in the last 72 hours. ------------------------------------------------------------------------------------------------------------------ No results for input(s): TSH, T4TOTAL, T3FREE, THYROIDAB in the last 72 hours.  Invalid input(s): FREET3 ------------------------------------------------------------------------------------------------------------------ No results for input(s): VITAMINB12, FOLATE, FERRITIN, TIBC, IRON, RETICCTPCT in the last 72 hours.  Coagulation profile No results for input(s): INR, PROTIME in the last 168 hours.   Recent Labs  09/05/16 1644  DDIMER 7.57*    Cardiac Enzymes No results for input(s): CKMB, TROPONINI, MYOGLOBIN in the last 168 hours.  Invalid input(s): CK ------------------------------------------------------------------------------------------------------------------ Invalid input(s): POCBNP   CBG: No results for input(s): GLUCAP in the last 168 hours.     Studies: Ct Hip Left Wo Contrast  Result Date: 09/06/2016 CLINICAL DATA:  Left hip pain.  History of metastatic breast cancer. EXAM: CT OF THE LEFT HIP WITHOUT CONTRAST TECHNIQUE: Multidetector CT imaging of the left hip was performed according to the standard protocol. Multiplanar CT image reconstructions were also generated. COMPARISON:  Bone scan dated April 06, 2016. CT abdomen pelvis dated December 16, 2014. FINDINGS: Bones/Joint/Cartilage Several pericentimeter mixed lytic and sclerotic lesions are seen in the left hemipelvis: A 13 mm lesion at the  left puboacetabular junction, a 13 mm lesion in the posterior ischium, and a 13 mm lesion in the parasymphyseal left pubic bone. Partially visualized 12 mm predominately lytic lesion in the left sacral ala. Additional small subcentimeter lytic lesions are seen in the posterior ischium (series 2, image 21), left iliac wing (series 4, image 16), and the left acetabulum (series 4, image 26). These lesions correspond to areas of increased uptake on the prior bone scan, but were not present on prior CT from December 2016. No lesion is seen in the left proximal femur. No acute fracture or malalignment. The left hip joint space is relatively preserved. Ligaments Suboptimally assessed by CT. Muscles and Tendons No focal abnormality. Soft tissues Unremarkable. 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 No fracture. Electronically Signed   By: Titus Dubin M.D.   On: 09/06/2016 15:09      No results found for: HGBA1C Lab Results  Component Value Date   CREATININE 2.27 (H) 09/08/2016       Scheduled Meds: . docusate sodium  200 mg Oral Daily  . exemestane  25 mg Oral QPC breakfast  . furosemide  40 mg Intravenous BID  . heparin  5,000 Units Subcutaneous Q8H  . mouth rinse  15 mL Mouth Rinse BID  . methadone  2.5 mg Oral Q8H  . polyethylene glycol  17 g Oral Daily  . potassium chloride  40 mEq Oral TID   Continuous Infusions: .  sodium bicarbonate infusion 1/4 NS 1000 mL       LOS: 3 days    Time spent: >30 MINS    Reyne Dumas  [facility] Pager [phone]. If 7PM-7AM, please contact night-coverage at [email or web address], password [facility] 09/08/2016, 10:30 AM  LOS: 3 days

## 2016-09-09 LAB — COMPREHENSIVE METABOLIC PANEL
ALBUMIN: 3.4 g/dL — AB (ref 3.5–5.0)
ALK PHOS: 121 U/L (ref 38–126)
ALT: 27 U/L (ref 14–54)
AST: 52 U/L — AB (ref 15–41)
Anion gap: 6 (ref 5–15)
BILIRUBIN TOTAL: 0.5 mg/dL (ref 0.3–1.2)
BUN: 26 mg/dL — ABNORMAL HIGH (ref 6–20)
CO2: 33 mmol/L — ABNORMAL HIGH (ref 22–32)
Calcium: 10.8 mg/dL — ABNORMAL HIGH (ref 8.9–10.3)
Chloride: 102 mmol/L (ref 101–111)
Creatinine, Ser: 2.28 mg/dL — ABNORMAL HIGH (ref 0.44–1.00)
GFR calc Af Amer: 28 mL/min — ABNORMAL LOW (ref >60)
GFR calc non Af Amer: 24 mL/min — ABNORMAL LOW (ref >60)
GLUCOSE: 98 mg/dL (ref 65–99)
Potassium: 4.1 mmol/L (ref 3.5–5.1)
Sodium: 141 mmol/L (ref 135–145)
Total Protein: 6.5 g/dL (ref 6.5–8.1)

## 2016-09-09 NOTE — Progress Notes (Signed)
[facility] PROGRESS NOTE  Carolyn [name] NOB:[id number] DOB: 03-01-1970 DOA: 09/05/2016   PCP: Patient, No Pcp Per  Brief narrative: 46 y.o. female with with history of metastatic breast cancer presents to the ER with complaints of low back pain radiating to her left lower extremity.  In the ER patient had MRI of the T and L-spine which showed progressive metastasis. Labs revealed severe hypercalcemia with calcium around 14.5 and creatinine worsened to 3.6 from almost normal last month.   Assessment and plan: 1. Severe hypercalcemia - due to met ca, has been treated with IVF, Lasix, Pamidronate, calcium is overall trending down, will repeat BMP in AM 2. Low back pain/left hip pain likely related to patient's metastatic cancer  - radiation onc consulted, palliative rad to start next week  3. Hypokalemia likely could be from poor oral intake - resolved  4. Acute renal failure - cause not clear could be prerenal from poor oral intake, elevated calcium, uric acid. Improving, will repeat BMP in AM.  5. 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 No evidence of active bleeding 6. Metastatic breast cancer-may need restaging with CT chest and bone scan.  DVT prophylaxsis heparin  Code Status:  Full code  Family Communication: pt updated at bedside   Disposition Plan: home in AM   Consultants:  Hematology oncology             Radiation oncology   Procedures:  None  Antibiotics: Anti-infectives    None       HPI/Subjective: No concerns this AM.   Objective: Vitals:   09/08/16 0521 09/08/16 1320 09/08/16 2151 09/09/16 0448  BP: 125/75 133/79 131/76 127/77  Pulse: 91 93 96 94  Resp: _0 Temp: 99.4 F (37.4 C) 99.8 F (37.7 C) 99.1 F (37.3 C) 98.4 F (36.9 C)  TempSrc: Oral Oral Oral Oral  SpO2: 93% 98% 94% 95%  Weight:    59 kg (130 lb 1.6 oz)  Height:        Intake/Output  Summary (Last 24 hours) at 09/09/16 1217 Last data filed at 09/09/16 0948  Gross per 24 hour  Intake             3250 ml  Output             3800 ml  Net             -550 ml   Physical Exam  Constitutional: Appears well-developed and well-nourished. No distress.  CVS: RRR, S1/S2 +, no murmurs, no gallops, no carotid bruit.  Pulmonary: Effort and breath sounds normal, no stridor, rhonchi, wheezes, rales.  Abdominal: Soft. BS +,  no distension, tenderness, rebound or guarding.  Musculoskeletal: Normal range of motion. No edema and no tenderness.   Data Reviewed: I have personally reviewed following labs and imaging studies  Radiology Reports Dg Chest 2 View  Result Date: 09/05/2016 CLINICAL DATA:  Lumbar pain radiating to the legs. EXAM: CHEST  2 VIEW COMPARISON:  June 17, 2014 FINDINGS: There is fullness of the left suprahilar region. The heart size normal. There is no focal infiltrate, pulmonary edema, or pleural effusion P Spinal rods are noted unchanged. Prior cholecystectomy clips are noted. IMPRESSION: Fullness of left suprahilar region. Further evaluation with a chest CT on outpatient basis is recommended to exclude mass. Electronically Signed   By: Mallie Darting.D.  On: 09/05/2016 19:48   Mr Thoracic Spine W Wo Contrast  Result Date: 09/05/2016 CLINICAL DATA:  46 y/o  F; 329, image 5 EXAM: MRI THORACIC AND LUMBAR SPINE WITHOUT AND WITH CONTRAST TECHNIQUE: Multiplanar and multiecho pulse sequences of the thoracic and lumbar spine were obtained without and with intravenous contrast. CONTRAST:  35m MULTIHANCE GADOBENATE DIMEGLUMINE 529 MG/ML IV SOLN COMPARISON:  04/06/2016, 01/14/2016, 05/03/2015 bone scan. 04/12/2014 lumbar and thoracic spine MRI FINDINGS: MRI THORACIC SPINE FINDINGS Alignment:  Physiologic. Vertebrae: T6-T10 posterior instrumented fusion and T8 laminectomy. Susceptibility artifact from the fusion hardware partially obscures the vertebral bodies at those levels. Stable  moderate compression deformity of the T8 vertebral body. Vertebral body heights are otherwise well-preserved. There is heterogeneous bone marrow signal within the T7, T8, T9, and T12 vertebral bodies corresponding to prior enhancing lesions without definite enhancement on the current study, probably representing treated metastasis. Mild enhancement within the posterior T11 and posterior T12 vertebral bodies consistent with residual metastasis. Additionally, there is enhancement within the T8 vertebral body greater on the right and extending into the pedicle compatible with residual metastasis. Cord: No abnormal cord signal or enhancement. The spinal cord compression. No epidural disease identified. Paraspinal and other soft tissues: Subcentimeter pulmonary nodules in the right lung base (series 9, image 34) and indeterminate T2 hyperintense lesions in the right lobe of liver (series 9, image 47). Disc levels: No significant disc displacement or canal stenosis. MRI LUMBAR SPINE FINDINGS Segmentation:  Standard. Alignment:  Physiologic. Vertebrae: The diffusely heterogeneous lumbar spine and pelvis bone marrow signal with enhancement compatible with osseous metastatic disease. Metastatic disease is most pronounced at the T5 vertebral body with a for epidural disease effaces the anterior thecal space and enters the bilateral neural foramen greater on the left. No loss of vertebral body height. Conus medullaris: Extends to the L1-2 level and appears normal. No abnormal enhancement. Paraspinal and other soft tissues: Negative. Disc levels: No significant disc displacement. Mild lower lumbar facet hypertrophy. Anterior epidural disease arising from the L5 vertebral body mildly narrows the spinal canal, effaces the lateral recesses with contact upon descending bilateral S1 nerve roots, moderately narrows the left and mildly narrows the right neural foramen. IMPRESSION: MRI thoracic spine: 1. Diffuse osseous metastasis with  residual enhancing disease visualized at the T8, T11, and T12 vertebral body levels. No epidural disease. 2. Stable moderate compression deformity of T8 vertebral body. No new loss of vertebral body height. 3. Subcentimeter pulmonary nodules and indeterminate liver lesions may represent metastasis. MRI lumbar spine: 1. Diffuse osseous metastasis of lumbar spine and visible pelvis. 2. No loss of vertebral body height. 3. Anterior epidural disease at T5 results in moderate canal stenosis, lateral recess effacement with contact upon descending S1 nerve roots, and moderate left/mild right neural foraminal stenosis. Electronically Signed   By: LKristine GarbeM.D.   On: 09/05/2016 19:42   Mr Lumbar Spine W Wo Contrast  Result Date: 09/05/2016 CLINICAL DATA:  46y/o  F; 329, image 5 EXAM: MRI THORACIC AND LUMBAR SPINE WITHOUT AND WITH CONTRAST TECHNIQUE: Multiplanar and multiecho pulse sequences of the thoracic and lumbar spine were obtained without and with intravenous contrast. CONTRAST:  128mMULTIHANCE GADOBENATE DIMEGLUMINE 529 MG/ML IV SOLN COMPARISON:  04/06/2016, 01/14/2016, 05/03/2015 bone scan. 04/12/2014 lumbar and thoracic spine MRI FINDINGS: MRI THORACIC SPINE FINDINGS Alignment:  Physiologic. Vertebrae: T6-T10 posterior instrumented fusion and T8 laminectomy. Susceptibility artifact from the fusion hardware partially obscures the vertebral bodies at those levels. Stable moderate compression  deformity of the T8 vertebral body. Vertebral body heights are otherwise well-preserved. There is heterogeneous bone marrow signal within the T7, T8, T9, and T12 vertebral bodies corresponding to prior enhancing lesions without definite enhancement on the current study, probably representing treated metastasis. Mild enhancement within the posterior T11 and posterior T12 vertebral bodies consistent with residual metastasis. Additionally, there is enhancement within the T8 vertebral body greater on the right  and extending into the pedicle compatible with residual metastasis. Cord: No abnormal cord signal or enhancement. The spinal cord compression. No epidural disease identified. Paraspinal and other soft tissues: Subcentimeter pulmonary nodules in the right lung base (series 9, image 34) and indeterminate T2 hyperintense lesions in the right lobe of liver (series 9, image 47). Disc levels: No significant disc displacement or canal stenosis. MRI LUMBAR SPINE FINDINGS Segmentation:  Standard. Alignment:  Physiologic. Vertebrae: The diffusely heterogeneous lumbar spine and pelvis bone marrow signal with enhancement compatible with osseous metastatic disease. Metastatic disease is most pronounced at the T5 vertebral body with a for epidural disease effaces the anterior thecal space and enters the bilateral neural foramen greater on the left. No loss of vertebral body height. Conus medullaris: Extends to the L1-2 level and appears normal. No abnormal enhancement. Paraspinal and other soft tissues: Negative. Disc levels: No significant disc displacement. Mild lower lumbar facet hypertrophy. Anterior epidural disease arising from the L5 vertebral body mildly narrows the spinal canal, effaces the lateral recesses with contact upon descending bilateral S1 nerve roots, moderately narrows the left and mildly narrows the right neural foramen. IMPRESSION: MRI thoracic spine: 1. Diffuse osseous metastasis with residual enhancing disease visualized at the T8, T11, and T12 vertebral body levels. No epidural disease. 2. Stable moderate compression deformity of T8 vertebral body. No new loss of vertebral body height. 3. Subcentimeter pulmonary nodules and indeterminate liver lesions may represent metastasis. MRI lumbar spine: 1. Diffuse osseous metastasis of lumbar spine and visible pelvis. 2. No loss of vertebral body height. 3. Anterior epidural disease at T5 results in moderate canal stenosis, lateral recess effacement with contact  upon descending S1 nerve roots, and moderate left/mild right neural foraminal stenosis. Electronically Signed   By: Kristine Garbe M.D.   On: 09/05/2016 19:42   US Renal  Result Date: 09/06/2016 CLINICAL DATA:  Left-sided flank pain, acute renal failure EXAM: RENAL / URINARY TRACT ULTRASOUND COMPLETE COMPARISON:  12/16/2014 FINDINGS: Right Kidney: Length: 11.2 cm. Echogenicity within normal limits. No mass or hydronephrosis visualized. Left Kidney: Length: 11.6 cm. Echogenicity within normal limits. No mass or hydronephrosis visualized. Bladder: Appears normal for degree of bladder distention. Note is made of a small left-sided pleural effusion. IMPRESSION: No acute renal abnormality is noted. Small left pleural effusion is noted. Electronically Signed   By: Inez Catalina M.D.   On: 09/06/2016 07:37   Ct Hip Left Wo Contrast  Result Date: 09/06/2016 CLINICAL DATA:  Left hip pain.  History of metastatic breast cancer. EXAM: CT OF THE LEFT HIP WITHOUT CONTRAST TECHNIQUE: Multidetector CT imaging of the left hip was performed according to the standard protocol. Multiplanar CT image reconstructions were also generated. COMPARISON:  Bone scan dated April 06, 2016. CT abdomen pelvis dated December 16, 2014. FINDINGS: Bones/Joint/Cartilage Several pericentimeter mixed lytic and sclerotic lesions are seen in the left hemipelvis: A 13 mm lesion at the left puboacetabular junction, a 13 mm lesion in the posterior ischium, and a 13 mm lesion in the parasymphyseal left pubic bone. Partially visualized 12 mm predominately lytic  lesion in the left sacral ala. Additional small subcentimeter lytic lesions are seen in the posterior ischium (series 2, image 21), left iliac wing (series 4, image 16), and the left acetabulum (series 4, image 26). These lesions correspond to areas of increased uptake on the prior bone scan, but were not present on prior CT from December 2016. No lesion is seen in the left proximal femur.  No acute fracture or malalignment. The left hip joint space is relatively preserved. Ligaments Suboptimally assessed by CT. Muscles and Tendons No focal abnormality. Soft tissues Unremarkable. 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 No fracture. Electronically Signed   By: Titus Dubin M.D.   On: 09/06/2016 15:09   Nm Pulmonary Vent And Perf (v/q Scan)  Result Date: 09/05/2016 CLINICAL DATA:  Lumbar pain, positive D-dimer EXAM: NUCLEAR MEDICINE VENTILATION - PERFUSION LUNG SCAN TECHNIQUE: Ventilation images were obtained in multiple projections using inhaled aerosol Tc-35mDTPA. Perfusion images were obtained in multiple projections after intravenous injection of Tc-965mAA. RADIOPHARMACEUTICALS:  32 mCi Technetium-99107mPA aerosol inhalation and 3.94 mCi Technetium-56m54m IV COMPARISON:  Chest x-ray 09/05/2016 FINDINGS: Ventilation: Heterogenous ventilation within the upper lobes. Central airways clumping of activity. Perfusion: Heterogenous perfusion. Multiple small relatively peripheral filling defects within the upper lobes similar in distribution to heterogeneous ventilation. IMPRESSION: Heterogenous profusion with multiple small matched defects in the upper lobes. Overall low probability for pulmonary embolus. Electronically Signed   By: Kim Donavan Foil.   On: 09/05/2016 22:03     CBC  Recent Labs Lab 09/05/16 1644 09/06/16 0000 09/06/16 0542 09/07/16 0405 09/08/16 0418  WBC 4.7 4.3 3.5* 4.4 4.2  HGB 8.5* 7.4* 7.0* 8.3* 8.6*  HCT 25.4* 21.7* 20.8* 24.5* 25.3*  PLT 124* 113* 93* 104* 105*  MCV 90.7 90.8 89.7 86.9 86.6  MCH 30.4 31.0 30.2 29.4 29.5  MCHC 33.5 34.1 33.7 33.9 34.0  RDW 14.7 14.6 14.6 16.5* 16.0*  LYMPHSABS 1.2  --   --   --   --   MONOABS 0.3  --   --   --   --   EOSABS 0.0  --   --   --   --   BASOSABS 0.0  --   --   --   --     Chemistries   Recent Labs Lab 09/05/16 1644 09/06/16 0000 09/06/16 0542 09/07/16 0405  09/08/16 0418 09/09/16 0501  NA 142  --  144 143 141 141  K 2.9*  --  4.0 3.1* 3.9 4.1  CL 96*  --  107 101 101 102  CO2 37*  --  30 34* 33* 33*  GLUCOSE 122*  --  90 93 93 98  BUN 42*  --  35* 32* 29* 26*  CREATININE 3.68* 3.61* 3.12* 2.60* 2.27* 2.28*  CALCIUM 14.5*  --  12.4* 11.7* 10.8* 10.8*  AST 35  --   --  41 32 52*  ALT 14  --   --  12* 13* 27  ALKPHOS 116  --   --  99 108 121  BILITOT 0.6  --   --  0.5 0.5 0.5   Studies: No results found.   No results found for: HGBA1C Lab Results  Component Value Date   CREATININE 2.28 (H) 09/09/2016   Scheduled Meds: . docusate sodium  200 mg Oral Daily  . exemestane  25 mg Oral QPC breakfast  . heparin  5,000 Units Subcutaneous Q8H  . mouth rinse  15 mL Mouth Rinse BID  . methadone  2.5 mg Oral Q8H  . polyethylene glycol  17 g Oral Daily   Continuous Infusions: .  sodium bicarbonate infusion 1/4 NS 1000 mL 150 mL/hr at 09/09/16 1151     LOS: 4 days   Time spent: 25 minutes  [facility] Pager [phone]. If 7PM-7AM, please contact night-coverage at [email or web address], password [facility] 09/09/2016, 12:17 PM  LOS: 4 days

## 2016-09-10 LAB — COMPREHENSIVE METABOLIC PANEL
ALT: 74 U/L — ABNORMAL HIGH (ref 14–54)
ANION GAP: 10 (ref 5–15)
AST: 104 U/L — AB (ref 15–41)
Albumin: 3.2 g/dL — ABNORMAL LOW (ref 3.5–5.0)
Alkaline Phosphatase: 126 U/L (ref 38–126)
BUN: 23 mg/dL — ABNORMAL HIGH (ref 6–20)
CHLORIDE: 99 mmol/L — AB (ref 101–111)
CO2: 33 mmol/L — ABNORMAL HIGH (ref 22–32)
Calcium: 11.2 mg/dL — ABNORMAL HIGH (ref 8.9–10.3)
Creatinine, Ser: 2.16 mg/dL — ABNORMAL HIGH (ref 0.44–1.00)
GFR, EST AFRICAN AMERICAN: 30 mL/min — AB (ref >60)
GFR, EST NON AFRICAN AMERICAN: 26 mL/min — AB (ref >60)
Glucose, Bld: 98 mg/dL (ref 65–99)
POTASSIUM: 3.5 mmol/L (ref 3.5–5.1)
Sodium: 142 mmol/L (ref 135–145)
TOTAL PROTEIN: 6.3 g/dL — AB (ref 6.5–8.1)
Total Bilirubin: 0.5 mg/dL (ref 0.3–1.2)

## 2016-09-10 LAB — CALCIUM, IONIZED: CALCIUM, IONIZED, SERUM: 6.2 mg/dL — AB (ref 4.5–5.6)

## 2016-09-10 MED ORDER — BISACODYL 5 MG PO TBEC
5.0000 mg | DELAYED_RELEASE_TABLET | Freq: Every day | ORAL | Status: DC | PRN
  Administered 2016-09-10 – 2016-09-11 (×2): 5 mg via ORAL
  Filled 2016-09-10 (×3): qty 1

## 2016-09-10 NOTE — Progress Notes (Signed)
Assumed care of patient from previous RN.  Agree with previous RN's assessment of patient.  Patient stable, comfortable resting in bed at this time.  Will continue to monitor patient.

## 2016-09-10 NOTE — Progress Notes (Signed)
[facility] PROGRESS NOTE  Carolyn [name]:[id number] DOB: 1970-11-16 DOA: 09/05/2016   PCP: Patient, No Pcp Per  Brief narrative: 46 y.o. female with with history of metastatic breast cancer presents to the ER with complaints of low back pain radiating to her left lower extremity.  In the ER patient had MRI of the T and L-spine which showed progressive metastasis. Labs revealed severe hypercalcemia with calcium around 14.5 and creatinine worsened to 3.6 from almost normal last month.   Assessment and plan: 1. Severe hypercalcemia - due to met ca, has been treated with IVF, Lasix, Pamidronate, calcium is still elevated, will monitor again overnight  2. Low back pain/left hip pain likely related to patient's metastatic cancer  - radiation onc consulted, palliative rad to start next week  3. Hypokalemia likely could be from poor oral intake - resolved  4. Acute renal failure - cause not clear could be prerenal from poor oral intake, elevated calcium, uric acid. Overall improving  5. 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 No evidence of active bleeding 6. Metastatic breast cancer-may need restaging with CT chest and bone scan.  DVT prophylaxsis heparin  Code Status:  Full code  Family Communication: pt updated at bedside   Disposition Plan: home in AM in calcium is stable    Consultants:  Hematology oncology             Radiation oncology   Procedures:  None  Antibiotics: Anti-infectives    None       HPI/Subjective: No concerns this AM.  Objective: Vitals:   09/09/16 2110 09/10/16 0555 09/10/16 0724 09/10/16 1300  BP: 130/84 134/73 130/76 130/70  Pulse: 95 (!) 104 91 93  Resp: '18 20 16 18  '$ Temp: 99.3 F (37.4 C) 99.7 F (37.6 C) 99.1 F (37.3 C) 98.9 F (37.2 C)  TempSrc: Oral Oral Oral Oral  SpO2: 94% 93% 97% 98%  Weight:  58.4 kg (128 lb 12.8 oz)    Height:         Intake/Output Summary (Last 24 hours) at 09/10/16 1933 Last data filed at 09/10/16 1230  Gross per 24 hour  Intake             1560 ml  Output             2300 ml  Net             -740 ml   Physical Exam  Constitutional: Appears well-developed and well-nourished. No distress.  HENT: Normocephalic. External right and left ear normal. Oropharynx is clear and moist.  Eyes: Conjunctivae and EOM are normal. PERRLA, no scleral icterus.  Neck: Normal ROM. Neck supple. No JVD. No tracheal deviation. No thyromegaly.  CVS: RRR, S1/S2 +, no murmurs, no gallops, no carotid bruit.  Pulmonary: Effort and breath sounds normal, no stridor, rhonchi, wheezes, rales.  Abdominal: Soft. BS +,  no distension, tenderness, rebound or guarding.   Data Reviewed: I have personally reviewed following labs and imaging studies  Radiology Reports Dg Chest 2 View  Result Date: 09/05/2016 CLINICAL DATA:  Lumbar pain radiating to the legs. EXAM: CHEST  2 VIEW COMPARISON:  June 17, 2014 FINDINGS: There is fullness of the left suprahilar region. The heart size normal. There is no focal infiltrate, pulmonary edema, or pleural effusion P Spinal rods are noted unchanged. Prior cholecystectomy clips are noted. IMPRESSION: Fullness of left  suprahilar region. Further evaluation with a chest CT on outpatient basis is recommended to exclude mass. Electronically Signed   By: Abelardo Diesel M.D.   On: 09/05/2016 19:48   Mr Thoracic Spine W Wo Contrast  Result Date: 09/05/2016 CLINICAL DATA:  46 y/o  F; 329, image 5 EXAM: MRI THORACIC AND LUMBAR SPINE WITHOUT AND WITH CONTRAST TECHNIQUE: Multiplanar and multiecho pulse sequences of the thoracic and lumbar spine were obtained without and with intravenous contrast. CONTRAST:  20m MULTIHANCE GADOBENATE DIMEGLUMINE 529 MG/ML IV SOLN COMPARISON:  04/06/2016, 01/14/2016, 05/03/2015 bone scan. 04/12/2014 lumbar and thoracic spine MRI FINDINGS: MRI THORACIC SPINE FINDINGS Alignment:   Physiologic. Vertebrae: T6-T10 posterior instrumented fusion and T8 laminectomy. Susceptibility artifact from the fusion hardware partially obscures the vertebral bodies at those levels. Stable moderate compression deformity of the T8 vertebral body. Vertebral body heights are otherwise well-preserved. There is heterogeneous bone marrow signal within the T7, T8, T9, and T12 vertebral bodies corresponding to prior enhancing lesions without definite enhancement on the current study, probably representing treated metastasis. Mild enhancement within the posterior T11 and posterior T12 vertebral bodies consistent with residual metastasis. Additionally, there is enhancement within the T8 vertebral body greater on the right and extending into the pedicle compatible with residual metastasis. Cord: No abnormal cord signal or enhancement. The spinal cord compression. No epidural disease identified. Paraspinal and other soft tissues: Subcentimeter pulmonary nodules in the right lung base (series 9, image 34) and indeterminate T2 hyperintense lesions in the right lobe of liver (series 9, image 47). Disc levels: No significant disc displacement or canal stenosis. MRI LUMBAR SPINE FINDINGS Segmentation:  Standard. Alignment:  Physiologic. Vertebrae: The diffusely heterogeneous lumbar spine and pelvis bone marrow signal with enhancement compatible with osseous metastatic disease. Metastatic disease is most pronounced at the T5 vertebral body with a for epidural disease effaces the anterior thecal space and enters the bilateral neural foramen greater on the left. No loss of vertebral body height. Conus medullaris: Extends to the L1-2 level and appears normal. No abnormal enhancement. Paraspinal and other soft tissues: Negative. Disc levels: No significant disc displacement. Mild lower lumbar facet hypertrophy. Anterior epidural disease arising from the L5 vertebral body mildly narrows the spinal canal, effaces the lateral recesses  with contact upon descending bilateral S1 nerve roots, moderately narrows the left and mildly narrows the right neural foramen. IMPRESSION: MRI thoracic spine: 1. Diffuse osseous metastasis with residual enhancing disease visualized at the T8, T11, and T12 vertebral body levels. No epidural disease. 2. Stable moderate compression deformity of T8 vertebral body. No new loss of vertebral body height. 3. Subcentimeter pulmonary nodules and indeterminate liver lesions may represent metastasis. MRI lumbar spine: 1. Diffuse osseous metastasis of lumbar spine and visible pelvis. 2. No loss of vertebral body height. 3. Anterior epidural disease at T5 results in moderate canal stenosis, lateral recess effacement with contact upon descending S1 nerve roots, and moderate left/mild right neural foraminal stenosis. Electronically Signed   By: LKristine GarbeM.D.   On: 09/05/2016 19:42   Mr Lumbar Spine W Wo Contrast  Result Date: 09/05/2016 CLINICAL DATA:  46y/o  F; 329, image 5 EXAM: MRI THORACIC AND LUMBAR SPINE WITHOUT AND WITH CONTRAST TECHNIQUE: Multiplanar and multiecho pulse sequences of the thoracic and lumbar spine were obtained without and with intravenous contrast. CONTRAST:  116mMULTIHANCE GADOBENATE DIMEGLUMINE 529 MG/ML IV SOLN COMPARISON:  04/06/2016, 01/14/2016, 05/03/2015 bone scan. 04/12/2014 lumbar and thoracic spine MRI FINDINGS: MRI THORACIC SPINE FINDINGS Alignment:  Physiologic. Vertebrae: T6-T10 posterior instrumented fusion and T8 laminectomy. Susceptibility artifact from the fusion hardware partially obscures the vertebral bodies at those levels. Stable moderate compression deformity of the T8 vertebral body. Vertebral body heights are otherwise well-preserved. There is heterogeneous bone marrow signal within the T7, T8, T9, and T12 vertebral bodies corresponding to prior enhancing lesions without definite enhancement on the current study, probably representing treated metastasis. Mild  enhancement within the posterior T11 and posterior T12 vertebral bodies consistent with residual metastasis. Additionally, there is enhancement within the T8 vertebral body greater on the right and extending into the pedicle compatible with residual metastasis. Cord: No abnormal cord signal or enhancement. The spinal cord compression. No epidural disease identified. Paraspinal and other soft tissues: Subcentimeter pulmonary nodules in the right lung base (series 9, image 34) and indeterminate T2 hyperintense lesions in the right lobe of liver (series 9, image 47). Disc levels: No significant disc displacement or canal stenosis. MRI LUMBAR SPINE FINDINGS Segmentation:  Standard. Alignment:  Physiologic. Vertebrae: The diffusely heterogeneous lumbar spine and pelvis bone marrow signal with enhancement compatible with osseous metastatic disease. Metastatic disease is most pronounced at the T5 vertebral body with a for epidural disease effaces the anterior thecal space and enters the bilateral neural foramen greater on the left. No loss of vertebral body height. Conus medullaris: Extends to the L1-2 level and appears normal. No abnormal enhancement. Paraspinal and other soft tissues: Negative. Disc levels: No significant disc displacement. Mild lower lumbar facet hypertrophy. Anterior epidural disease arising from the L5 vertebral body mildly narrows the spinal canal, effaces the lateral recesses with contact upon descending bilateral S1 nerve roots, moderately narrows the left and mildly narrows the right neural foramen. IMPRESSION: MRI thoracic spine: 1. Diffuse osseous metastasis with residual enhancing disease visualized at the T8, T11, and T12 vertebral body levels. No epidural disease. 2. Stable moderate compression deformity of T8 vertebral body. No new loss of vertebral body height. 3. Subcentimeter pulmonary nodules and indeterminate liver lesions may represent metastasis. MRI lumbar spine: 1. Diffuse osseous  metastasis of lumbar spine and visible pelvis. 2. No loss of vertebral body height. 3. Anterior epidural disease at T5 results in moderate canal stenosis, lateral recess effacement with contact upon descending S1 nerve roots, and moderate left/mild right neural foraminal stenosis. Electronically Signed   By: Kristine Garbe M.D.   On: 09/05/2016 19:42   US Renal  Result Date: 09/06/2016 CLINICAL DATA:  Left-sided flank pain, acute renal failure EXAM: RENAL / URINARY TRACT ULTRASOUND COMPLETE COMPARISON:  12/16/2014 FINDINGS: Right Kidney: Length: 11.2 cm. Echogenicity within normal limits. No mass or hydronephrosis visualized. Left Kidney: Length: 11.6 cm. Echogenicity within normal limits. No mass or hydronephrosis visualized. Bladder: Appears normal for degree of bladder distention. Note is made of a small left-sided pleural effusion. IMPRESSION: No acute renal abnormality is noted. Small left pleural effusion is noted. Electronically Signed   By: Inez Catalina M.D.   On: 09/06/2016 07:37   Ct Hip Left Wo Contrast  Result Date: 09/06/2016 CLINICAL DATA:  Left hip pain.  History of metastatic breast cancer. EXAM: CT OF THE LEFT HIP WITHOUT CONTRAST TECHNIQUE: Multidetector CT imaging of the left hip was performed according to the standard protocol. Multiplanar CT image reconstructions were also generated. COMPARISON:  Bone scan dated April 06, 2016. CT abdomen pelvis dated December 16, 2014. FINDINGS: Bones/Joint/Cartilage Several pericentimeter mixed lytic and sclerotic lesions are seen in the left hemipelvis: A 13 mm lesion at the left puboacetabular  junction, a 13 mm lesion in the posterior ischium, and a 13 mm lesion in the parasymphyseal left pubic bone. Partially visualized 12 mm predominately lytic lesion in the left sacral ala. Additional small subcentimeter lytic lesions are seen in the posterior ischium (series 2, image 21), left iliac wing (series 4, image 16), and the left acetabulum  (series 4, image 26). These lesions correspond to areas of increased uptake on the prior bone scan, but were not present on prior CT from December 2016. No lesion is seen in the left proximal femur. No acute fracture or malalignment. The left hip joint space is relatively preserved. Ligaments Suboptimally assessed by CT. Muscles and Tendons No focal abnormality. Soft tissues Unremarkable. 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 No fracture. Electronically Signed   By: Titus Dubin M.D.   On: 09/06/2016 15:09   Nm Pulmonary Vent And Perf (v/q Scan)  Result Date: 09/05/2016 CLINICAL DATA:  Lumbar pain, positive D-dimer EXAM: NUCLEAR MEDICINE VENTILATION - PERFUSION LUNG SCAN TECHNIQUE: Ventilation images were obtained in multiple projections using inhaled aerosol Tc-60mDTPA. Perfusion images were obtained in multiple projections after intravenous injection of Tc-980mAA. RADIOPHARMACEUTICALS:  32 mCi Technetium-9985mPA aerosol inhalation and 3.94 mCi Technetium-91m60m IV COMPARISON:  Chest x-ray 09/05/2016 FINDINGS: Ventilation: Heterogenous ventilation within the upper lobes. Central airways clumping of activity. Perfusion: Heterogenous perfusion. Multiple small relatively peripheral filling defects within the upper lobes similar in distribution to heterogeneous ventilation. IMPRESSION: Heterogenous profusion with multiple small matched defects in the upper lobes. Overall low probability for pulmonary embolus. Electronically Signed   By: Kim Donavan Foil.   On: 09/05/2016 22:03     CBC  Recent Labs Lab 09/05/16 1644 09/06/16 0000 09/06/16 0542 09/07/16 0405 09/08/16 0418  WBC 4.7 4.3 3.5* 4.4 4.2  HGB 8.5* 7.4* 7.0* 8.3* 8.6*  HCT 25.4* 21.7* 20.8* 24.5* 25.3*  PLT 124* 113* 93* 104* 105*  MCV 90.7 90.8 89.7 86.9 86.6  MCH 30.4 31.0 30.2 29.4 29.5  MCHC 33.5 34.1 33.7 33.9 34.0  RDW 14.7 14.6 14.6 16.5* 16.0*  LYMPHSABS 1.2  --   --   --   --   MONOABS 0.3   --   --   --   --   EOSABS 0.0  --   --   --   --   BASOSABS 0.0  --   --   --   --     Chemistries   Recent Labs Lab 09/05/16 1644  09/06/16 0542 09/07/16 0405 09/08/16 0418 09/09/16 0501 09/10/16 0425  NA 142  --  144 143 141 141 142  K 2.9*  --  4.0 3.1* 3.9 4.1 3.5  CL 96*  --  107 101 101 102 99*  CO2 37*  --  30 34* 33* 33* 33*  GLUCOSE 122*  --  90 93 93 98 98  BUN 42*  --  35* 32* 29* 26* 23*  CREATININE 3.68*  < > 3.12* 2.60* 2.27* 2.28* 2.16*  CALCIUM 14.5*  --  12.4* 11.7* 10.8* 10.8* 11.2*  AST 35  --   --  41 32 52* 104*  ALT 14  --   --  12* 13* 27 74*  ALKPHOS 116  --   --  99 108 121 126  BILITOT 0.6  --   --  0.5 0.5 0.5 0.5  < > = values in this interval not displayed. Studies: No results found.   No results  found for: HGBA1C Lab Results  Component Value Date   CREATININE 2.16 (H) 09/10/2016   Scheduled Meds: . docusate sodium  200 mg Oral Daily  . exemestane  25 mg Oral QPC breakfast  . heparin  5,000 Units Subcutaneous Q8H  . mouth rinse  15 mL Mouth Rinse BID  . methadone  2.5 mg Oral Q8H  . polyethylene glycol  17 g Oral Daily   Continuous Infusions:    LOS: 5 days   Time spent: 25 minutes  [facility] Pager [phone]. If 7PM-7AM, please contact night-coverage at [email or web address], password [facility] 09/10/2016, 7:33 PM  LOS: 5 days

## 2016-09-11 LAB — BASIC METABOLIC PANEL
ANION GAP: 9 (ref 5–15)
ANION GAP: 12 (ref 5–15)
BUN: 23 mg/dL — ABNORMAL HIGH (ref 6–20)
BUN: 21 mg/dL — ABNORMAL HIGH (ref 6–20)
CO2: 33 mmol/L — ABNORMAL HIGH (ref 22–32)
CO2: 32 mmol/L (ref 22–32)
Calcium: 11.9 mg/dL — ABNORMAL HIGH (ref 8.9–10.3)
Calcium: 11.5 mg/dL — ABNORMAL HIGH (ref 8.9–10.3)
Chloride: 101 mmol/L (ref 101–111)
Chloride: 100 mmol/L — ABNORMAL LOW (ref 101–111)
Creatinine, Ser: 2.21 mg/dL — ABNORMAL HIGH (ref 0.44–1.00)
Creatinine, Ser: 2.03 mg/dL — ABNORMAL HIGH (ref 0.44–1.00)
GFR calc Af Amer: 29 mL/min — ABNORMAL LOW (ref >60)
GFR calc Af Amer: 33 mL/min — ABNORMAL LOW (ref >60)
GFR, EST NON AFRICAN AMERICAN: 25 mL/min — AB (ref >60)
GFR, EST NON AFRICAN AMERICAN: 28 mL/min — AB (ref >60)
GLUCOSE: 98 mg/dL (ref 65–99)
Glucose, Bld: 110 mg/dL — ABNORMAL HIGH (ref 65–99)
POTASSIUM: 3.3 mmol/L — AB (ref 3.5–5.1)
POTASSIUM: 2.9 mmol/L — AB (ref 3.5–5.1)
SODIUM: 143 mmol/L (ref 135–145)
SODIUM: 144 mmol/L (ref 135–145)

## 2016-09-11 LAB — CBC
HCT: 26 % — ABNORMAL LOW (ref 36.0–46.0)
HEMOGLOBIN: 8.7 g/dL — AB (ref 12.0–15.0)
MCH: 29.5 pg (ref 26.0–34.0)
MCHC: 33.5 g/dL (ref 30.0–36.0)
MCV: 88.1 fL (ref 78.0–100.0)
PLATELETS: 111 10*3/uL — AB (ref 150–400)
RBC: 2.95 MIL/uL — AB (ref 3.87–5.11)
RDW: 15.6 % — ABNORMAL HIGH (ref 11.5–15.5)
WBC: 6.1 10*3/uL (ref 4.0–10.5)

## 2016-09-11 MED ORDER — SODIUM CHLORIDE 0.9 % IV SOLN
INTRAVENOUS | Status: DC
  Administered 2016-09-11 – 2016-09-13 (×4): via INTRAVENOUS

## 2016-09-11 MED ORDER — POTASSIUM CHLORIDE CRYS ER 20 MEQ PO TBCR
40.0000 meq | EXTENDED_RELEASE_TABLET | Freq: Once | ORAL | Status: AC
  Administered 2016-09-11: 40 meq via ORAL
  Filled 2016-09-11: qty 2

## 2016-09-11 MED ORDER — FUROSEMIDE 10 MG/ML IJ SOLN
20.0000 mg | Freq: Two times a day (BID) | INTRAMUSCULAR | Status: AC
  Administered 2016-09-11 (×2): 20 mg via INTRAVENOUS
  Filled 2016-09-11 (×2): qty 2

## 2016-09-11 NOTE — Progress Notes (Signed)
[facility] PROGRESS NOTE  Carolyn [name] ZOX:[id number] DOB: July 15, 1970 DOA: 09/05/2016   PCP: Patient, No Pcp Per  Brief narrative: 46 y.o. female with with history of metastatic breast cancer presents to the ER with complaints of low back pain radiating to her left lower extremity.  In the ER patient had MRI of the T and L-spine which showed progressive metastasis. Labs revealed severe hypercalcemia with calcium around 14.5 and creatinine worsened to 3.6 from almost normal last month.   Assessment and plan: 1. Severe hypercalcemia - due to met ca, has been treated with IVF, Lasix, Pamidronate, calcium is still trending up, will place back on IVF and lasix x2 doses, repeat BMP this afternoon and decide if pt can be discharged or needs another day for monitoring  2. Low back pain/left hip pain likely related to patient's metastatic cancer  - radiation onc consulted, palliative rad to start next week   3. Hypokalemia likely could be from poor oral intake - supplement, repeat BMP 4. Acute renal failure - cause not clear could be prerenal from poor oral intake, elevated calcium, uric acid. Improving  5. Anemia likely from malignancy related - Status post 1 unit of packed red blood cells, hemoglobin stable posttransfusion.No evidence of active bleeding  6. Metastatic breast cancer-may need restaging with CT chest and bone scan.  DVT prophylaxsis heparin  Code Status:  Full code  Family Communication: pt updated at bedside   Disposition Plan: home when calcium level stable    Consultants:  Hematology oncology             Radiation oncology   Procedures:  None  Antibiotics: Anti-infectives    None       HPI/Subjective: Pt reports feeling better.   Objective: Vitals:   09/10/16 0724 09/10/16 1300 09/10/16 2000 09/11/16 0409  BP: 130/76 130/70 130/76 134/72  Pulse: 91 93 95 67  Resp: '16 18 18 18  ' Temp: 99.1 F (37.3 C) 98.9 F (37.2 C) 98.7 F (37.1 C) 99.2 F  (37.3 C)  TempSrc: Oral Oral Oral Oral  SpO2: 97% 98% 97% 97%  Weight:    57.5 kg (126 lb 11.2 oz)  Height:        Intake/Output Summary (Last 24 hours) at 09/11/16 1028 Last data filed at 09/11/16 0300  Gross per 24 hour  Intake              600 ml  Output              500 ml  Net              100 ml   Physical Exam  Constitutional: Appears well-developed and well-nourished. No distress.  CVS: RRR, S1/S2 +, no murmurs, no gallops, no carotid bruit.  Pulmonary: Effort and breath sounds normal, no stridor, rhonchi, wheezes, rales.  Abdominal: Soft. BS +,  no distension, tenderness, rebound or guarding.  Musculoskeletal: Normal range of motion. No edema and no tenderness.   Data Reviewed: I have personally reviewed following labs and imaging studies  Radiology Reports Dg Chest 2 View  Result Date: 09/05/2016 CLINICAL DATA:  Lumbar pain radiating to the legs. EXAM: CHEST  2 VIEW COMPARISON:  June 17, 2014 FINDINGS: There is fullness of the left suprahilar region. The heart size normal. There is no focal infiltrate, pulmonary edema, or pleural effusion P Spinal rods are noted unchanged. Prior cholecystectomy clips are noted. IMPRESSION: Fullness of left suprahilar region.  Further evaluation with a chest CT on outpatient basis is recommended to exclude mass. Electronically Signed   By: Abelardo Diesel M.D.   On: 09/05/2016 19:48   Mr Thoracic Spine W Wo Contrast  Result Date: 09/05/2016 CLINICAL DATA:  46 y/o  F; 329, image 5 EXAM: MRI THORACIC AND LUMBAR SPINE WITHOUT AND WITH CONTRAST TECHNIQUE: Multiplanar and multiecho pulse sequences of the thoracic and lumbar spine were obtained without and with intravenous contrast. CONTRAST:  102m MULTIHANCE GADOBENATE DIMEGLUMINE 529 MG/ML IV SOLN COMPARISON:  04/06/2016, 01/14/2016, 05/03/2015 bone scan. 04/12/2014 lumbar and thoracic spine MRI FINDINGS: MRI THORACIC SPINE FINDINGS Alignment:  Physiologic. Vertebrae: T6-T10 posterior instrumented  fusion and T8 laminectomy. Susceptibility artifact from the fusion hardware partially obscures the vertebral bodies at those levels. Stable moderate compression deformity of the T8 vertebral body. Vertebral body heights are otherwise well-preserved. There is heterogeneous bone marrow signal within the T7, T8, T9, and T12 vertebral bodies corresponding to prior enhancing lesions without definite enhancement on the current study, probably representing treated metastasis. Mild enhancement within the posterior T11 and posterior T12 vertebral bodies consistent with residual metastasis. Additionally, there is enhancement within the T8 vertebral body greater on the right and extending into the pedicle compatible with residual metastasis. Cord: No abnormal cord signal or enhancement. The spinal cord compression. No epidural disease identified. Paraspinal and other soft tissues: Subcentimeter pulmonary nodules in the right lung base (series 9, image 34) and indeterminate T2 hyperintense lesions in the right lobe of liver (series 9, image 47). Disc levels: No significant disc displacement or canal stenosis. MRI LUMBAR SPINE FINDINGS Segmentation:  Standard. Alignment:  Physiologic. Vertebrae: The diffusely heterogeneous lumbar spine and pelvis bone marrow signal with enhancement compatible with osseous metastatic disease. Metastatic disease is most pronounced at the T5 vertebral body with a for epidural disease effaces the anterior thecal space and enters the bilateral neural foramen greater on the left. No loss of vertebral body height. Conus medullaris: Extends to the L1-2 level and appears normal. No abnormal enhancement. Paraspinal and other soft tissues: Negative. Disc levels: No significant disc displacement. Mild lower lumbar facet hypertrophy. Anterior epidural disease arising from the L5 vertebral body mildly narrows the spinal canal, effaces the lateral recesses with contact upon descending bilateral S1 nerve roots,  moderately narrows the left and mildly narrows the right neural foramen. IMPRESSION: MRI thoracic spine: 1. Diffuse osseous metastasis with residual enhancing disease visualized at the T8, T11, and T12 vertebral body levels. No epidural disease. 2. Stable moderate compression deformity of T8 vertebral body. No new loss of vertebral body height. 3. Subcentimeter pulmonary nodules and indeterminate liver lesions may represent metastasis. MRI lumbar spine: 1. Diffuse osseous metastasis of lumbar spine and visible pelvis. 2. No loss of vertebral body height. 3. Anterior epidural disease at T5 results in moderate canal stenosis, lateral recess effacement with contact upon descending S1 nerve roots, and moderate left/mild right neural foraminal stenosis. Electronically Signed   By: LKristine GarbeM.D.   On: 09/05/2016 19:42   Mr Lumbar Spine W Wo Contrast  Result Date: 09/05/2016 CLINICAL DATA:  46y/o  F; 329, image 5 EXAM: MRI THORACIC AND LUMBAR SPINE WITHOUT AND WITH CONTRAST TECHNIQUE: Multiplanar and multiecho pulse sequences of the thoracic and lumbar spine were obtained without and with intravenous contrast. CONTRAST:  192mMULTIHANCE GADOBENATE DIMEGLUMINE 529 MG/ML IV SOLN COMPARISON:  04/06/2016, 01/14/2016, 05/03/2015 bone scan. 04/12/2014 lumbar and thoracic spine MRI FINDINGS: MRI THORACIC SPINE FINDINGS Alignment:  Physiologic.  Vertebrae: T6-T10 posterior instrumented fusion and T8 laminectomy. Susceptibility artifact from the fusion hardware partially obscures the vertebral bodies at those levels. Stable moderate compression deformity of the T8 vertebral body. Vertebral body heights are otherwise well-preserved. There is heterogeneous bone marrow signal within the T7, T8, T9, and T12 vertebral bodies corresponding to prior enhancing lesions without definite enhancement on the current study, probably representing treated metastasis. Mild enhancement within the posterior T11 and posterior T12  vertebral bodies consistent with residual metastasis. Additionally, there is enhancement within the T8 vertebral body greater on the right and extending into the pedicle compatible with residual metastasis. Cord: No abnormal cord signal or enhancement. The spinal cord compression. No epidural disease identified. Paraspinal and other soft tissues: Subcentimeter pulmonary nodules in the right lung base (series 9, image 34) and indeterminate T2 hyperintense lesions in the right lobe of liver (series 9, image 47). Disc levels: No significant disc displacement or canal stenosis. MRI LUMBAR SPINE FINDINGS Segmentation:  Standard. Alignment:  Physiologic. Vertebrae: The diffusely heterogeneous lumbar spine and pelvis bone marrow signal with enhancement compatible with osseous metastatic disease. Metastatic disease is most pronounced at the T5 vertebral body with a for epidural disease effaces the anterior thecal space and enters the bilateral neural foramen greater on the left. No loss of vertebral body height. Conus medullaris: Extends to the L1-2 level and appears normal. No abnormal enhancement. Paraspinal and other soft tissues: Negative. Disc levels: No significant disc displacement. Mild lower lumbar facet hypertrophy. Anterior epidural disease arising from the L5 vertebral body mildly narrows the spinal canal, effaces the lateral recesses with contact upon descending bilateral S1 nerve roots, moderately narrows the left and mildly narrows the right neural foramen. IMPRESSION: MRI thoracic spine: 1. Diffuse osseous metastasis with residual enhancing disease visualized at the T8, T11, and T12 vertebral body levels. No epidural disease. 2. Stable moderate compression deformity of T8 vertebral body. No new loss of vertebral body height. 3. Subcentimeter pulmonary nodules and indeterminate liver lesions may represent metastasis. MRI lumbar spine: 1. Diffuse osseous metastasis of lumbar spine and visible pelvis. 2. No  loss of vertebral body height. 3. Anterior epidural disease at T5 results in moderate canal stenosis, lateral recess effacement with contact upon descending S1 nerve roots, and moderate left/mild right neural foraminal stenosis. Electronically Signed   By: Kristine Garbe M.D.   On: 09/05/2016 19:42   US Renal  Result Date: 09/06/2016 CLINICAL DATA:  Left-sided flank pain, acute renal failure EXAM: RENAL / URINARY TRACT ULTRASOUND COMPLETE COMPARISON:  12/16/2014 FINDINGS: Right Kidney: Length: 11.2 cm. Echogenicity within normal limits. No mass or hydronephrosis visualized. Left Kidney: Length: 11.6 cm. Echogenicity within normal limits. No mass or hydronephrosis visualized. Bladder: Appears normal for degree of bladder distention. Note is made of a small left-sided pleural effusion. IMPRESSION: No acute renal abnormality is noted. Small left pleural effusion is noted. Electronically Signed   By: Inez Catalina M.D.   On: 09/06/2016 07:37   Ct Hip Left Wo Contrast  Result Date: 09/06/2016 CLINICAL DATA:  Left hip pain.  History of metastatic breast cancer. EXAM: CT OF THE LEFT HIP WITHOUT CONTRAST TECHNIQUE: Multidetector CT imaging of the left hip was performed according to the standard protocol. Multiplanar CT image reconstructions were also generated. COMPARISON:  Bone scan dated April 06, 2016. CT abdomen pelvis dated December 16, 2014. FINDINGS: Bones/Joint/Cartilage Several pericentimeter mixed lytic and sclerotic lesions are seen in the left hemipelvis: A 13 mm lesion at the left puboacetabular junction,  a 13 mm lesion in the posterior ischium, and a 13 mm lesion in the parasymphyseal left pubic bone. Partially visualized 12 mm predominately lytic lesion in the left sacral ala. Additional small subcentimeter lytic lesions are seen in the posterior ischium (series 2, image 21), left iliac wing (series 4, image 16), and the left acetabulum (series 4, image 26). These lesions correspond to areas  of increased uptake on the prior bone scan, but were not present on prior CT from December 2016. No lesion is seen in the left proximal femur. No acute fracture or malalignment. The left hip joint space is relatively preserved. Ligaments Suboptimally assessed by CT. Muscles and Tendons No focal abnormality. Soft tissues Unremarkable. 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 No fracture. Electronically Signed   By: Titus Dubin M.D.   On: 09/06/2016 15:09   Nm Pulmonary Vent And Perf (v/q Scan)  Result Date: 09/05/2016 CLINICAL DATA:  Lumbar pain, positive D-dimer EXAM: NUCLEAR MEDICINE VENTILATION - PERFUSION LUNG SCAN TECHNIQUE: Ventilation images were obtained in multiple projections using inhaled aerosol Tc-83mDTPA. Perfusion images were obtained in multiple projections after intravenous injection of Tc-93mAA. RADIOPHARMACEUTICALS:  32 mCi Technetium-9963mPA aerosol inhalation and 3.94 mCi Technetium-64m31m IV COMPARISON:  Chest x-ray 09/05/2016 FINDINGS: Ventilation: Heterogenous ventilation within the upper lobes. Central airways clumping of activity. Perfusion: Heterogenous perfusion. Multiple small relatively peripheral filling defects within the upper lobes similar in distribution to heterogeneous ventilation. IMPRESSION: Heterogenous profusion with multiple small matched defects in the upper lobes. Overall low probability for pulmonary embolus. Electronically Signed   By: Kim Donavan Foil.   On: 09/05/2016 22:03     CBC  Recent Labs Lab 09/05/16 1644 09/06/16 0000 09/06/16 0542 09/07/16 0405 09/08/16 0418 09/11/16 0504  WBC 4.7 4.3 3.5* 4.4 4.2 6.1  HGB 8.5* 7.4* 7.0* 8.3* 8.6* 8.7*  HCT 25.4* 21.7* 20.8* 24.5* 25.3* 26.0*  PLT 124* 113* 93* 104* 105* 111*  MCV 90.7 90.8 89.7 86.9 86.6 88.1  MCH 30.4 31.0 30.2 29.4 29.5 29.5  MCHC 33.5 34.1 33.7 33.9 34.0 33.5  RDW 14.7 14.6 14.6 16.5* 16.0* 15.6*  LYMPHSABS 1.2  --   --   --   --   --   MONOABS  0.3  --   --   --   --   --   EOSABS 0.0  --   --   --   --   --   BASOSABS 0.0  --   --   --   --   --     Chemistries   Recent Labs Lab 09/05/16 1644  09/07/16 0405 09/08/16 0418 09/09/16 0501 09/10/16 0425 09/11/16 0504  NA 142  < > 143 141 141 142 143  K 2.9*  < > 3.1* 3.9 4.1 3.5 3.3*  CL 96*  < > 101 101 102 99* 101  CO2 37*  < > 34* 33* 33* 33* 33*  GLUCOSE 122*  < > 93 93 98 98 98  BUN 42*  < > 32* 29* 26* 23* 23*  CREATININE 3.68*  < > 2.60* 2.27* 2.28* 2.16* 2.21*  CALCIUM 14.5*  < > 11.7* 10.8* 10.8* 11.2* 11.9*  AST 35  --  41 32 52* 104*  --   ALT 14  --  12* 13* 27 74*  --   ALKPHOS 116  --  99 108 121 126  --   BILITOT 0.6  --  0.5 0.5 0.5 0.5  --   < > =  values in this interval not displayed. Studies: No results found.   No results found for: HGBA1C Lab Results  Component Value Date   CREATININE 2.21 (H) 09/11/2016   Scheduled Meds: . docusate sodium  200 mg Oral Daily  . exemestane  25 mg Oral QPC breakfast  . furosemide  20 mg Intravenous BID  . heparin  5,000 Units Subcutaneous Q8H  . mouth rinse  15 mL Mouth Rinse BID  . methadone  2.5 mg Oral Q8H  . polyethylene glycol  17 g Oral Daily   Continuous Infusions: . sodium chloride 100 mL/hr at 09/11/16 0915     LOS: 6 days   Time spent: 25 minutes  [facility] Pager [phone]. If 7PM-7AM, please contact night-coverage at [email or web address], password [facility] 09/11/2016, 10:28 AM  LOS: 6 days

## 2016-09-12 ENCOUNTER — Ambulatory Visit
Admit: 2016-09-12 | Discharge: 2016-09-12 | Disposition: A | Discharge status: Home / Self Care | Payer: BC Managed Care – PPO | Attending: Radiation Oncology | Admitting: Radiation Oncology

## 2016-09-12 DIAGNOSIS — M545 Low back pain: Secondary | ICD-10-CM

## 2016-09-12 DIAGNOSIS — C7951 Secondary malignant neoplasm of bone: Secondary | ICD-10-CM

## 2016-09-12 LAB — BASIC METABOLIC PANEL
ANION GAP: 10 (ref 5–15)
BUN: 19 mg/dL (ref 6–20)
CALCIUM: 11.9 mg/dL — AB (ref 8.9–10.3)
CO2: 31 mmol/L (ref 22–32)
Chloride: 103 mmol/L (ref 101–111)
Creatinine, Ser: 2.13 mg/dL — ABNORMAL HIGH (ref 0.44–1.00)
GFR, EST AFRICAN AMERICAN: 31 mL/min — AB (ref >60)
GFR, EST NON AFRICAN AMERICAN: 26 mL/min — AB (ref >60)
Glucose, Bld: 101 mg/dL — ABNORMAL HIGH (ref 65–99)
Potassium: 3 mmol/L — ABNORMAL LOW (ref 3.5–5.1)
Sodium: 144 mmol/L (ref 135–145)

## 2016-09-12 LAB — CBC
HCT: 28.7 % — ABNORMAL LOW (ref 36.0–46.0)
HEMOGLOBIN: 9.4 g/dL — AB (ref 12.0–15.0)
MCH: 29 pg (ref 26.0–34.0)
MCHC: 32.8 g/dL (ref 30.0–36.0)
MCV: 88.6 fL (ref 78.0–100.0)
Platelets: 102 10*3/uL — ABNORMAL LOW (ref 150–400)
RBC: 3.24 MIL/uL — AB (ref 3.87–5.11)
RDW: 15.7 % — ABNORMAL HIGH (ref 11.5–15.5)
WBC: 6.3 10*3/uL (ref 4.0–10.5)

## 2016-09-12 MED ORDER — POTASSIUM CHLORIDE CRYS ER 20 MEQ PO TBCR
40.0000 meq | EXTENDED_RELEASE_TABLET | Freq: Once | ORAL | Status: AC
  Administered 2016-09-12: 40 meq via ORAL
  Filled 2016-09-12: qty 2

## 2016-09-12 MED ORDER — PROMETHAZINE HCL 25 MG RE SUPP: 25.0000 mg | Freq: Three times a day (TID) | RECTAL | Status: DC | PRN

## 2016-09-12 NOTE — Progress Notes (Signed)
Carolyn [name]   DOB:1970-07-30   NO#:[id number]   VQX#:[id number]  Subjective:  Stephane tells me the pain is very well controlled. The bowel prophylaxis was "a little much." She is having some vomiting. She thinks she would be "better" at home-- sister and 2 nephews live nearby and can help, though they would not be staying with her. She has an appt with rad onc today  Objective: middle aged African American woman examined in bed Vitals:   09/11/16 2122 09/12/16 0603  BP: (!) 143/83 (!) 145/75  Pulse: (!) 104 100  Resp: 18 18  Temp: 99 F (37.2 C) 98.9 F (37.2 C)  SpO2: 95% 93%    Body mass index is 19.14 kg/m.  Intake/Output Summary (Last 24 hours) at 09/12/16 0804 Last data filed at 09/12/16 0600  Gross per 24 hour  Intake             2720 ml  Output              601 ml  Net             2119 ml    CBG (last 3)  No results for input(s): GLUCAP in the last 72 hours.   Labs:  Lab Results  Component Value Date   WBC 6.1 09/11/2016   HGB 8.7 (L) 09/11/2016   HCT 26.0 (L) 09/11/2016   MCV 88.1 09/11/2016   PLT 111 (L) 09/11/2016   NEUTROABS 3.2 09/05/2016    _0 @  Urine Studies No results for input(s): UHGB, CRYS in the last 72 hours.  Invalid input(s): UACOL, UAPR, USPG, UPH, UTP, UGL, UKET, UBIL, UNIT, UROB, [place], UEPI, UWBC, Junie [place], [place], [place]  Basic Metabolic Panel:  Recent Labs Lab 09/08/16 0418 09/09/16 0501 09/10/16 0425 09/11/16 0504 09/11/16 1635  NA 141 141 142 143 144  K 3.9 4.1 3.5 3.3* 2.9*  CL 101 102 99* 101 100*  CO2 33* 33* 33* 33* 32  GLUCOSE 93 98 98 98 110*  BUN 29* 26* 23* 23* 21*  CREATININE 2.27* 2.28* 2.16* 2.21* 2.03*  CALCIUM 10.8* 10.8* 11.2* 11.9* 11.5*   GFR Estimated Creatinine Clearance: 30.9 mL/min (A) (by C-G formula based on SCr of 2.03 mg/dL (H)). Liver Function Tests:  Recent Labs Lab 09/05/16 1644 09/07/16 0405 09/08/16 0418 09/09/16 0501 09/10/16 0425  AST 35 41 32 52* 104*  ALT 14 12* 13*  27 74*  ALKPHOS 116 99 108 121 126  BILITOT 0.6 0.5 0.5 0.5 0.5  PROT 7.3 6.0* 6.1* 6.5 6.3*  ALBUMIN 4.1 3.3* 3.1* 3.4* 3.2*   No results for input(s): LIPASE, AMYLASE in the last 168 hours. No results for input(s): AMMONIA in the last 168 hours. Coagulation profile No results for input(s): INR, PROTIME in the last 168 hours.  CBC:  Recent Labs Lab 09/05/16 1644 09/06/16 0000 09/06/16 0542 09/07/16 0405 09/08/16 0418 09/11/16 0504  WBC 4.7 4.3 3.5* 4.4 4.2 6.1  NEUTROABS 3.2  --   --   --   --   --   HGB 8.5* 7.4* 7.0* 8.3* 8.6* 8.7*  HCT 25.4* 21.7* 20.8* 24.5* 25.3* 26.0*  MCV 90.7 90.8 89.7 86.9 86.6 88.1  PLT 124* 113* 93* 104* 105* 111*   Cardiac Enzymes: No results for input(s): CKTOTAL, CKMB, CKMBINDEX, TROPONINI in the last 168 hours. BNP: Invalid input(s): POCBNP CBG: No results for input(s): GLUCAP in the last 168 hours. D-Dimer No results for input(s): DDIMER in the last 72 hours.  Hgb A1c No results for input(s): HGBA1C in the last 72 hours. Lipid Profile No results for input(s): CHOL, HDL, LDLCALC, TRIG, CHOLHDL, LDLDIRECT in the last 72 hours. Thyroid function studies No results for input(s): TSH, T4TOTAL, T3FREE, THYROIDAB in the last 72 hours.  Invalid input(s): FREET3 Anemia work up No results for input(s): VITAMINB12, FOLATE, FERRITIN, TIBC, IRON, RETICCTPCT in the last 72 hours. Microbiology No results found for this or any previous visit (from the past 240 hour(s)).    Studies:  No results found.  Assessment: 46 y.o. 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              (a) VUS were also found in [place] and BARD1  (2) additional right breast biopsy upper inner quadrant 06/08/2011 showed only a fibroadenoma, and central left breast biopsy for another suspicious lesion showed only fibrocystic changes ([id number] [id number] and [id number]).   (3)Treated neoadjuvantly with cyclophosphamide and docetaxel x4 completed 08/29/2011.  (4) status post right modified radical mastectomy 10/03/2011 showing a residual pT1c pN1a (3/18 lymph nodes positive) invasive ductal carcinoma, grade 1,estrogen receptor 100% positive, progesterone receptor negative, with no HER-2 amplification (SZA [id number])  (5) adjuvant radiation to the right chest wall, right supraclavicular fossa and right scar completed 12/26/2011  (6) tamoxifen started January 2014-- discontinued April 2016 with evidence of metastatic disease  (7) status post total hysterectomy with bilateral salpingo-oophorectomy 12/23/2012 with benign pathology ([id number])  METASTATIC DISEASE 04/13/2014 (8) presented with T8 cord compression and underwent laminectomy 04/13/2014 with T8 decompression of spinal cord, posterior fixation from T6-T10 with pedicle screws and rods, posterior arthrodesis with allograft. 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 ExacTrac Snap verification was performed for each couch angle.              (b) radiation to the T-spine completed 05/08/2014.              (c) [facility]             (d) SRS to 0.4 cm left parietal lesion 12/23/2015  (11) started fulvestrant 05/18/2014 and Palbociclib 06/01/2014             (a) Palbociclib dose decreased to 100 mg daily, 21/7 as of 08/05/2014             (b) palbociclib dose decreased to 75 mg daily, 21/7, starting May 2017             (  c) palbociclib dose decreased to 75 mg every other day beginning 07/26/2015               (d) palbociclib held for month of October because of persistent low counts, resumed November             (e) fulvestrant discontinued after 11/02/2016 dose: Anastrozole started 08/01/2016  (f) switched to exemestane as of August 2018, everolimus to be added   (12) started zolendronate 05/18/2014, repeated every 12 weeks             (a) July 2017 zolendronate dose held because of dental issues, resumed 10/18/2015             (b) switched to denosumab as of 01/20/2016 with concern of bony progression on zolendronate             (c) switched back to zolendronate as of April 2018 at patient's request  (d) denosumab/xgeva resumed 09/08/2016 with hypercalcemia  (13) most recent staging studies:             (a) brain MRI 07/13/2016 shows stable to smaller treated lesions.             (b) bone scan 04/06/2016 shows no new lesions             (c) chest CT scan 04/06/2016 shows small bilateral pulmonary nodules with no evidence of disease progression  (d) brain MRI stable to slightly improved  (14) hypercalcemia and increased bone pain 09/05/2016 c/w progression bone involvement  (a) s/p pamidronate 09/05/2016 (note received zolendronate 08/01/2016)  (b) xgeva was to be resumed 09/08/2016, to be repeated Q4W  Plan:  [facility] labs [facility] are pending. Yesterday Ca++ was still >11. Creat was trending down.  I had written for denosumab/xgeva 08/31 but for some reason that was not done. I can give her a dose this week as outpatient  when she comes in for radiation treatments.  Will add phenergan suppositories PRN so she has these at home in case she cannot take po antiemetics well.  Thank you again for your help to this patient!   Chauncey Cruel, MD 09/12/2016  8:04 AM Medical Oncology and Hematology [facility] [address] [place], [place] [address] Tel. [phone]    Fax. [phone]

## 2016-09-12 NOTE — Progress Notes (Signed)
[facility] PROGRESS NOTE  AILY TZENG TGP:[id number] DOB: 06-09-1970 DOA: 09/05/2016   PCP: Patient, No Pcp Per  Brief narrative: 46 y.o. female with with history of metastatic breast cancer presents to the ER with complaints of low back pain radiating to her left lower extremity.  In the ER patient had MRI of the T and L-spine which showed progressive metastasis. Labs revealed severe hypercalcemia with calcium around 14.5 and creatinine worsened to 3.6 from almost normal last month.   Assessment and plan: 1. Severe hypercalcemia - due to met ca, has been treated with IVF, Lasix, Pamidronate, calcium is still trending up despite IVF and Lasix, will page oncologist to discuss further recommendations, BMP in AM 2. Low back pain/left hip pain likely related to patient's metastatic cancer  - radiation onc consulted, palliative rad to start this week  3. Hypokalemia likely could be from poor oral intake - continue to supplement  4. Acute renal failure - cause not clear could be prerenal from poor oral intake, elevated calcium, uric acid. Initially improved but now up a bit. Will not give any more lasix but will keep on IVF alone to see if this will help.  5. Anemia likely from malignancy related - Status post 1 unit of packed red blood cells, hemoglobin stable posttransfusion.No evidence of active bleeding  6. Metastatic breast cancer-may need restaging with CT chest and bone scan.  DVT prophylaxsis heparin  Code Status:  Full code  Family Communication: pt updated at bedside   Disposition Plan: home when calcium level stable    Consultants:  Hematology oncology             Radiation oncology   Procedures:  None  Antibiotics: Anti-infectives    None       HPI/Subjective: Pt wants to go home.   Objective: Vitals:   09/11/16 1310 09/11/16 2122 09/12/16 0603 09/12/16 1521  BP: 124/75 (!) 143/83 (!) 145/75 130/69  Pulse: 99 (!) 104 100 95  Resp: '18 18 18 19   ' Temp: 98.5 F (36.9 C) 99 F (37.2 C) 98.9 F (37.2 C) 98.2 F (36.8 C)  TempSrc: Oral Oral Oral Oral  SpO2: 98% 95% 93% 97%  Weight:   57.1 kg (125 lb 14.4 oz)   Height:        Intake/Output Summary (Last 24 hours) at 09/12/16 1753 Last data filed at 09/12/16 1500  Gross per 24 hour  Intake             2900 ml  Output              600 ml  Net             2300 ml   Physical Exam  Constitutional: Appears well-developed and well-nourished. No distress.  CVS: RRR, S1/S2 +, no murmurs, no gallops, no carotid bruit.  Pulmonary: Effort and breath sounds normal, no stridor, rhonchi, wheezes, rales.  Abdominal: Soft. BS +,  no distension, tenderness, rebound or guarding.  Musculoskeletal: Normal range of motion. No edema and no tenderness.  Lymphadenopathy: No lymphadenopathy noted, cervical, inguinal.  Data Reviewed: I have personally reviewed following labs and imaging studies  Radiology Reports Dg Chest 2 View  Result Date: 09/05/2016 CLINICAL DATA:  Lumbar pain radiating to the legs. EXAM: CHEST  2 VIEW COMPARISON:  June 17, 2014 FINDINGS: There is fullness of the left suprahilar region. The heart size normal. There is no focal infiltrate, pulmonary edema, or  pleural effusion P Spinal rods are noted unchanged. Prior cholecystectomy clips are noted. IMPRESSION: Fullness of left suprahilar region. Further evaluation with a chest CT on outpatient basis is recommended to exclude mass. Electronically Signed   By: Abelardo Diesel M.D.   On: 09/05/2016 19:48   Mr Thoracic Spine W Wo Contrast  Result Date: 09/05/2016 CLINICAL DATA:  46 y/o  F; 329, image 5 EXAM: MRI THORACIC AND LUMBAR SPINE WITHOUT AND WITH CONTRAST TECHNIQUE: Multiplanar and multiecho pulse sequences of the thoracic and lumbar spine were obtained without and with intravenous contrast. CONTRAST:  70m MULTIHANCE GADOBENATE DIMEGLUMINE 529 MG/ML IV SOLN COMPARISON:  04/06/2016, 01/14/2016, 05/03/2015 bone scan. 04/12/2014  lumbar and thoracic spine MRI FINDINGS: MRI THORACIC SPINE FINDINGS Alignment:  Physiologic. Vertebrae: T6-T10 posterior instrumented fusion and T8 laminectomy. Susceptibility artifact from the fusion hardware partially obscures the vertebral bodies at those levels. Stable moderate compression deformity of the T8 vertebral body. Vertebral body heights are otherwise well-preserved. There is heterogeneous bone marrow signal within the T7, T8, T9, and T12 vertebral bodies corresponding to prior enhancing lesions without definite enhancement on the current study, probably representing treated metastasis. Mild enhancement within the posterior T11 and posterior T12 vertebral bodies consistent with residual metastasis. Additionally, there is enhancement within the T8 vertebral body greater on the right and extending into the pedicle compatible with residual metastasis. Cord: No abnormal cord signal or enhancement. The spinal cord compression. No epidural disease identified. Paraspinal and other soft tissues: Subcentimeter pulmonary nodules in the right lung base (series 9, image 34) and indeterminate T2 hyperintense lesions in the right lobe of liver (series 9, image 47). Disc levels: No significant disc displacement or canal stenosis. MRI LUMBAR SPINE FINDINGS Segmentation:  Standard. Alignment:  Physiologic. Vertebrae: The diffusely heterogeneous lumbar spine and pelvis bone marrow signal with enhancement compatible with osseous metastatic disease. Metastatic disease is most pronounced at the T5 vertebral body with a for epidural disease effaces the anterior thecal space and enters the bilateral neural foramen greater on the left. No loss of vertebral body height. Conus medullaris: Extends to the L1-2 level and appears normal. No abnormal enhancement. Paraspinal and other soft tissues: Negative. Disc levels: No significant disc displacement. Mild lower lumbar facet hypertrophy. Anterior epidural disease arising from the  L5 vertebral body mildly narrows the spinal canal, effaces the lateral recesses with contact upon descending bilateral S1 nerve roots, moderately narrows the left and mildly narrows the right neural foramen. IMPRESSION: MRI thoracic spine: 1. Diffuse osseous metastasis with residual enhancing disease visualized at the T8, T11, and T12 vertebral body levels. No epidural disease. 2. Stable moderate compression deformity of T8 vertebral body. No new loss of vertebral body height. 3. Subcentimeter pulmonary nodules and indeterminate liver lesions may represent metastasis. MRI lumbar spine: 1. Diffuse osseous metastasis of lumbar spine and visible pelvis. 2. No loss of vertebral body height. 3. Anterior epidural disease at T5 results in moderate canal stenosis, lateral recess effacement with contact upon descending S1 nerve roots, and moderate left/mild right neural foraminal stenosis. Electronically Signed   By: LKristine GarbeM.D.   On: 09/05/2016 19:42   Mr Lumbar Spine W Wo Contrast  Result Date: 09/05/2016 CLINICAL DATA:  46y/o  F; 329, image 5 EXAM: MRI THORACIC AND LUMBAR SPINE WITHOUT AND WITH CONTRAST TECHNIQUE: Multiplanar and multiecho pulse sequences of the thoracic and lumbar spine were obtained without and with intravenous contrast. CONTRAST:  160mMULTIHANCE GADOBENATE DIMEGLUMINE 529 MG/ML IV SOLN COMPARISON:  04/06/2016, 01/14/2016, 05/03/2015 bone scan. 04/12/2014 lumbar and thoracic spine MRI FINDINGS: MRI THORACIC SPINE FINDINGS Alignment:  Physiologic. Vertebrae: T6-T10 posterior instrumented fusion and T8 laminectomy. Susceptibility artifact from the fusion hardware partially obscures the vertebral bodies at those levels. Stable moderate compression deformity of the T8 vertebral body. Vertebral body heights are otherwise well-preserved. There is heterogeneous bone marrow signal within the T7, T8, T9, and T12 vertebral bodies corresponding to prior enhancing lesions without definite  enhancement on the current study, probably representing treated metastasis. Mild enhancement within the posterior T11 and posterior T12 vertebral bodies consistent with residual metastasis. Additionally, there is enhancement within the T8 vertebral body greater on the right and extending into the pedicle compatible with residual metastasis. Cord: No abnormal cord signal or enhancement. The spinal cord compression. No epidural disease identified. Paraspinal and other soft tissues: Subcentimeter pulmonary nodules in the right lung base (series 9, image 34) and indeterminate T2 hyperintense lesions in the right lobe of liver (series 9, image 47). Disc levels: No significant disc displacement or canal stenosis. MRI LUMBAR SPINE FINDINGS Segmentation:  Standard. Alignment:  Physiologic. Vertebrae: The diffusely heterogeneous lumbar spine and pelvis bone marrow signal with enhancement compatible with osseous metastatic disease. Metastatic disease is most pronounced at the T5 vertebral body with a for epidural disease effaces the anterior thecal space and enters the bilateral neural foramen greater on the left. No loss of vertebral body height. Conus medullaris: Extends to the L1-2 level and appears normal. No abnormal enhancement. Paraspinal and other soft tissues: Negative. Disc levels: No significant disc displacement. Mild lower lumbar facet hypertrophy. Anterior epidural disease arising from the L5 vertebral body mildly narrows the spinal canal, effaces the lateral recesses with contact upon descending bilateral S1 nerve roots, moderately narrows the left and mildly narrows the right neural foramen. IMPRESSION: MRI thoracic spine: 1. Diffuse osseous metastasis with residual enhancing disease visualized at the T8, T11, and T12 vertebral body levels. No epidural disease. 2. Stable moderate compression deformity of T8 vertebral body. No new loss of vertebral body height. 3. Subcentimeter pulmonary nodules and  indeterminate liver lesions may represent metastasis. MRI lumbar spine: 1. Diffuse osseous metastasis of lumbar spine and visible pelvis. 2. No loss of vertebral body height. 3. Anterior epidural disease at T5 results in moderate canal stenosis, lateral recess effacement with contact upon descending S1 nerve roots, and moderate left/mild right neural foraminal stenosis. Electronically Signed   By: Kristine Garbe M.D.   On: 09/05/2016 19:42   US Renal  Result Date: 09/06/2016 CLINICAL DATA:  Left-sided flank pain, acute renal failure EXAM: RENAL / URINARY TRACT ULTRASOUND COMPLETE COMPARISON:  12/16/2014 FINDINGS: Right Kidney: Length: 11.2 cm. Echogenicity within normal limits. No mass or hydronephrosis visualized. Left Kidney: Length: 11.6 cm. Echogenicity within normal limits. No mass or hydronephrosis visualized. Bladder: Appears normal for degree of bladder distention. Note is made of a small left-sided pleural effusion. IMPRESSION: No acute renal abnormality is noted. Small left pleural effusion is noted. Electronically Signed   By: Inez Catalina M.D.   On: 09/06/2016 07:37   Ct Hip Left Wo Contrast  Result Date: 09/06/2016 CLINICAL DATA:  Left hip pain.  History of metastatic breast cancer. EXAM: CT OF THE LEFT HIP WITHOUT CONTRAST TECHNIQUE: Multidetector CT imaging of the left hip was performed according to the standard protocol. Multiplanar CT image reconstructions were also generated. COMPARISON:  Bone scan dated April 06, 2016. CT abdomen pelvis dated December 16, 2014. FINDINGS: Bones/Joint/Cartilage Several pericentimeter mixed  lytic and sclerotic lesions are seen in the left hemipelvis: A 13 mm lesion at the left puboacetabular junction, a 13 mm lesion in the posterior ischium, and a 13 mm lesion in the parasymphyseal left pubic bone. Partially visualized 12 mm predominately lytic lesion in the left sacral ala. Additional small subcentimeter lytic lesions are seen in the posterior  ischium (series 2, image 21), left iliac wing (series 4, image 16), and the left acetabulum (series 4, image 26). These lesions correspond to areas of increased uptake on the prior bone scan, but were not present on prior CT from December 2016. No lesion is seen in the left proximal femur. No acute fracture or malalignment. The left hip joint space is relatively preserved. Ligaments Suboptimally assessed by CT. Muscles and Tendons No focal abnormality. Soft tissues Unremarkable. 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 No fracture. Electronically Signed   By: Titus Dubin M.D.   On: 09/06/2016 15:09   Nm Pulmonary Vent And Perf (v/q Scan)  Result Date: 09/05/2016 CLINICAL DATA:  Lumbar pain, positive D-dimer EXAM: NUCLEAR MEDICINE VENTILATION - PERFUSION LUNG SCAN TECHNIQUE: Ventilation images were obtained in multiple projections using inhaled aerosol Tc-58mDTPA. Perfusion images were obtained in multiple projections after intravenous injection of Tc-926mAA. RADIOPHARMACEUTICALS:  32 mCi Technetium-9962mPA aerosol inhalation and 3.94 mCi Technetium-69m60m IV COMPARISON:  Chest x-ray 09/05/2016 FINDINGS: Ventilation: Heterogenous ventilation within the upper lobes. Central airways clumping of activity. Perfusion: Heterogenous perfusion. Multiple small relatively peripheral filling defects within the upper lobes similar in distribution to heterogeneous ventilation. IMPRESSION: Heterogenous profusion with multiple small matched defects in the upper lobes. Overall low probability for pulmonary embolus. Electronically Signed   By: Kim Donavan Foil.   On: 09/05/2016 22:03     CBC  Recent Labs Lab 09/06/16 0542 09/07/16 0405 09/08/16 0418 09/11/16 0504 09/12/16 0911  WBC 3.5* 4.4 4.2 6.1 6.3  HGB 7.0* 8.3* 8.6* 8.7* 9.4*  HCT 20.8* 24.5* 25.3* 26.0* 28.7*  PLT 93* 104* 105* 111* 102*  MCV 89.7 86.9 86.6 88.1 88.6  MCH 30.2 29.4 29.5 29.5 29.0  MCHC 33.7 33.9 34.0  33.5 32.8  RDW 14.6 16.5* 16.0* 15.6* 15.7*    Chemistries   Recent Labs Lab 09/07/16 0405 09/08/16 0418 09/09/16 0501 09/10/16 0425 09/11/16 0504 09/11/16 1635 09/12/16 0911  NA 143 141 141 142 143 144 144  K 3.1* 3.9 4.1 3.5 3.3* 2.9* 3.0*  CL 101 101 102 99* 101 100* 103  CO2 34* 33* 33* 33* 33* 32 31  GLUCOSE 93 93 98 98 98 110* 101*  BUN 32* 29* 26* 23* 23* 21* 19  CREATININE 2.60* 2.27* 2.28* 2.16* 2.21* 2.03* 2.13*  CALCIUM 11.7* 10.8* 10.8* 11.2* 11.9* 11.5* 11.9*  AST 41 32 52* 104*  --   --   --   ALT 12* 13* 27 74*  --   --   --   ALKPHOS 99 108 121 126  --   --   --   BILITOT 0.5 0.5 0.5 0.5  --   --   --    Studies: No results found.   No results found for: HGBA1C Lab Results  Component Value Date   CREATININE 2.13 (H) 09/12/2016   Scheduled Meds: . docusate sodium  200 mg Oral Daily  . exemestane  25 mg Oral QPC breakfast  . heparin  5,000 Units Subcutaneous Q8H  . mouth rinse  15 mL Mouth Rinse BID  . methadone  2.5 mg Oral Q8H  . polyethylene glycol  17 g Oral Daily   Continuous Infusions: . sodium chloride 100 mL/hr at 09/12/16 0549     LOS: 7 days   Time spent: 25 minutes  [facility] Pager [phone]. If 7PM-7AM, please contact night-coverage at [email or web address], password [email or web address] 09/12/2016, 5:53 PM  LOS: 7 days

## 2016-09-13 ENCOUNTER — Ambulatory Visit
Admit: 2016-09-13 | Discharge: 2016-09-13 | Disposition: A | Discharge status: Home / Self Care | Payer: BC Managed Care – PPO | Attending: Radiation Oncology | Admitting: Radiation Oncology

## 2016-09-13 LAB — BASIC METABOLIC PANEL
Anion gap: 7 (ref 5–15)
BUN: 15 mg/dL (ref 6–20)
CALCIUM: 11.6 mg/dL — AB (ref 8.9–10.3)
CO2: 31 mmol/L (ref 22–32)
CREATININE: 1.78 mg/dL — AB (ref 0.44–1.00)
Chloride: 105 mmol/L (ref 101–111)
GFR calc non Af Amer: 33 mL/min — ABNORMAL LOW (ref >60)
GFR, EST AFRICAN AMERICAN: 38 mL/min — AB (ref >60)
Glucose, Bld: 124 mg/dL — ABNORMAL HIGH (ref 65–99)
Potassium: 3.5 mmol/L (ref 3.5–5.1)
SODIUM: 143 mmol/L (ref 135–145)

## 2016-09-13 MED ORDER — DENOSUMAB 120 MG/1.7ML ~~LOC~~ SOLN
120.0000 mg | Freq: Once | SUBCUTANEOUS | Status: AC
  Administered 2016-09-13: 120 mg via SUBCUTANEOUS
  Filled 2016-09-13: qty 1.7

## 2016-09-13 NOTE — Progress Notes (Signed)
PROGRESS NOTE    Carolyn [name]  JQB:[id number] DOB: 1970/10/07 DOA: 09/05/2016 PCP: Patient, No Pcp Per    Brief Narrative: 46 year old very pleasant young lady has a long history of breast cancer. Reviewed Dr.Magrinats note and appreciate it. Patient also had a history of cord compression T8 in April 2016. Patient now admitted with dehydration back pain and hypercalcemia. Patient was given a dose of pamidronate IV fluids and Lasix. Calcium level has not fallen down significantly. Today her calcium level is 11.6 which is down from 11.9 yesterday creatinine is down to 1.70 from 2.13. Potassium improved to 3.5 from 3.0. Mild elevation in AST ALT is noted. Platelet count is 102.   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 Will continue IV fluids for now. Follow-up levels tomorrow.  Hypokalemia resolved with repletion.  Status post acute kidney injury secondary to hypercalcemia. Cranial functions have improved with IV hydration.  Metastatic breast cancer with metastases to the spine and brain patient follow-up with Dr. Jerilynn Mages on September 17 for further chemotherapy.   DVT prophylaxis: heparin Code Status: full Family Communication Disposition Plan: tomorrow   Consultants:oncology  Procedures:   Antimicrobials:    Subjective:feels better  Objective: Vitals:   09/12/16 2152 09/13/16 0447 09/13/16 0500 09/13/16 1522  BP: 127/72 137/74  (!) 178/81  Pulse: (!) 109 99  (!) 119  Resp: 20 18  17   Temp: 99.1 F (37.3 C) 98.1 F (36.7 C)  98 F (36.7 C)  TempSrc: Oral Oral  Oral  SpO2: 92% 97%  93%  Weight:   57.6 kg (126 lb 15.8 oz)   Height:        Intake/Output Summary  (Last 24 hours) at 09/13/16 1556 Last data filed at 09/13/16 1522  Gross per 24 hour  Intake             2480 ml  Output             2900 ml  Net             -420 ml   Filed Weights   09/11/16 0409 09/12/16 0603 09/13/16 0500  Weight: 57.5 kg (126 lb 11.2 oz) 57.1 kg (125 lb 14.4 oz) 57.6 kg (126 lb 15.8 oz)    Examination:  General exam: Appears calm and comfortable  Respiratory system: Clear to auscultation. Respiratory effort normal. Cardiovascular system: S1 & S2 heard, RRR. No JVD, murmurs, rubs, gallops or clicks. No pedal edema. Gastrointestinal system: Abdomen is nondistended, soft and nontender. No organomegaly or masses felt. Normal bowel sounds heard. Central nervous system: Alert and oriented. No focal neurological deficits. Extremities: Symmetric 5 x 5 power. Skin: No rashes, lesions or ulcers Psychiatry: Judgement and insight appear normal. Mood & affect appropriate.     Data Reviewed: I have personally reviewed following labs and imaging studies  CBC:  Recent Labs Lab 09/07/16 0405 09/08/16 0418 09/11/16 0504 09/12/16 0911  WBC 4.4 4.2 6.1 6.3  HGB 8.3* 8.6* 8.7* 9.4*  HCT 24.5* 25.3* 26.0* 28.7*  MCV 86.9 86.6 88.1 88.6  PLT 104* 105* 111* 409*   Basic Metabolic Panel:  Recent Labs Lab 09/10/16 0425 09/11/16 0504 09/11/16  1635 09/12/16 0911 09/13/16 1043  NA 142 143 144 144 143  K 3.5 3.3* 2.9* 3.0* 3.5  CL 99* 101 100* 103 105  CO2 33* 33* 32 31 31  GLUCOSE 98 98 110* 101* 124*  BUN 23* 23* 21* 19 15  CREATININE 2.16* 2.21* 2.03* 2.13* 1.78*  CALCIUM 11.2* 11.9* 11.5* 11.9* 11.6*   GFR: Estimated Creatinine Clearance: 35.5 mL/min (A) (by C-G formula based on SCr of 1.78 mg/dL (H)). Liver Function Tests:  Recent Labs Lab 09/07/16 0405 09/08/16 0418 09/09/16 0501 09/10/16 0425  AST 41 32 52* 104*  ALT 12* 13* 27 74*  ALKPHOS 99 108 121 126  BILITOT 0.5 0.5 0.5 0.5  PROT 6.0* 6.1* 6.5 6.3*  ALBUMIN 3.3* 3.1* 3.4* 3.2*   No  results for input(s): LIPASE, AMYLASE in the last 168 hours. No results for input(s): AMMONIA in the last 168 hours. Coagulation Profile: No results for input(s): INR, PROTIME in the last 168 hours. Cardiac Enzymes: No results for input(s): CKTOTAL, CKMB, CKMBINDEX, TROPONINI in the last 168 hours. BNP (last 3 results) No results for input(s): PROBNP in the last 8760 hours. HbA1C: No results for input(s): HGBA1C in the last 72 hours. CBG: No results for input(s): GLUCAP in the last 168 hours. Lipid Profile: No results for input(s): CHOL, HDL, LDLCALC, TRIG, CHOLHDL, LDLDIRECT in the last 72 hours. Thyroid Function Tests: No results for input(s): TSH, T4TOTAL, FREET4, T3FREE, THYROIDAB in the last 72 hours. Anemia Panel: No results for input(s): VITAMINB12, FOLATE, FERRITIN, TIBC, IRON, RETICCTPCT in the last 72 hours. Sepsis Labs: No results for input(s): PROCALCITON, LATICACIDVEN in the last 168 hours.  No results found for this or any previous visit (from the past 240 hour(s)).       Radiology Studies: No results found.      Scheduled Meds: . denosumab  120 mg Subcutaneous Once  . docusate sodium  200 mg Oral Daily  . exemestane  25 mg Oral QPC breakfast  . heparin  5,000 Units Subcutaneous Q8H  . mouth rinse  15 mL Mouth Rinse BID  . methadone  2.5 mg Oral Q8H  . polyethylene glycol  17 g Oral Daily   Continuous Infusions: . sodium chloride 100 mL/hr at 09/13/16 0600     LOS: 8 days    Time spent:     Georgette Shell, MD [facility]  If 7PM-7AM, please contact night-coverage [email or web address] Password [email or web address] 09/13/2016, 3:56 PM

## 2016-09-13 NOTE — Progress Notes (Signed)
Carolyn [name]   DOB:April 15, 1970   EK#:[id number]   MBP#:[id number]  Subjective:  [place] inhaled corticosteroids ambulating halls w walker; very steady; tells me pain is a 4-5/10 on current dose of methadone, which she is tolerating well; BM yesterday, not hard, on current bowel prophylaxis  Objective: middle aged African American woman walking halls with aide Vitals:   09/12/16 2152 09/13/16 0447  BP: 127/72 137/74  Pulse: (!) 109 99  Resp: 20 18  Temp: 99.1 F (37.3 C) 98.1 F (36.7 C)  SpO2: 92% 97%    Body mass index is 19.31 kg/m.  Intake/Output Summary (Last 24 hours) at 09/13/16 0834 Last data filed at 09/13/16 0828  Gross per 24 hour  Intake             3000 ml  Output             2400 ml  Net              600 ml    CBG (last 3)  No results for input(s): GLUCAP in the last 72 hours.   Labs:  Lab Results  Component Value Date   WBC 6.3 09/12/2016   HGB 9.4 (L) 09/12/2016   HCT 28.7 (L) 09/12/2016   MCV 88.6 09/12/2016   PLT 102 (L) 09/12/2016   NEUTROABS 3.2 09/05/2016    _0 @  Urine Studies No results for input(s): UHGB, CRYS in the last 72 hours.  Invalid input(s): UACOL, UAPR, USPG, UPH, UTP, UGL, UKET, UBIL, UNIT, UROB, [place], UEPI, UWBC, [place], [place], [place], [place], [place]  Basic Metabolic Panel:  Recent Labs Lab 09/09/16 0501 09/10/16 0425 09/11/16 0504 09/11/16 1635 09/12/16 0911  NA 141 142 143 144 144  K 4.1 3.5 3.3* 2.9* 3.0*  CL 102 99* 101 100* 103  CO2 33* 33* 33* 32 31  GLUCOSE 98 98 98 110* 101*  BUN 26* 23* 23* 21* 19  CREATININE 2.28* 2.16* 2.21* 2.03* 2.13*  CALCIUM 10.8* 11.2* 11.9* 11.5* 11.9*   GFR Estimated Creatinine Clearance: 29.7 mL/min (A) (by C-G formula based on SCr of 2.13 mg/dL (H)). Liver Function Tests:  Recent Labs Lab 09/07/16 0405 09/08/16 0418 09/09/16 0501 09/10/16 0425  AST 41 32 52* 104*  ALT 12* 13* 27 74*  ALKPHOS 99 108 121 126  BILITOT 0.5 0.5 0.5 0.5  PROT 6.0* 6.1* 6.5 6.3*  ALBUMIN 3.3*  3.1* 3.4* 3.2*   No results for input(s): LIPASE, AMYLASE in the last 168 hours. No results for input(s): AMMONIA in the last 168 hours. Coagulation profile No results for input(s): INR, PROTIME in the last 168 hours.  CBC:  Recent Labs Lab 09/07/16 0405 09/08/16 0418 09/11/16 0504 09/12/16 0911  WBC 4.4 4.2 6.1 6.3  HGB 8.3* 8.6* 8.7* 9.4*  HCT 24.5* 25.3* 26.0* 28.7*  MCV 86.9 86.6 88.1 88.6  PLT 104* 105* 111* 102*   Cardiac Enzymes: No results for input(s): CKTOTAL, CKMB, CKMBINDEX, TROPONINI in the last 168 hours. BNP: Invalid input(s): POCBNP CBG: No results for input(s): GLUCAP in the last 168 hours. D-Dimer No results for input(s): DDIMER in the last 72 hours. Hgb A1c No results for input(s): HGBA1C in the last 72 hours. Lipid Profile No results for input(s): CHOL, HDL, LDLCALC, TRIG, CHOLHDL, LDLDIRECT in the last 72 hours. Thyroid function studies No results for input(s): TSH, T4TOTAL, T3FREE, THYROIDAB in the last 72 hours.  Invalid input(s): FREET3 Anemia work up No results for input(s): VITAMINB12, FOLATE, FERRITIN, TIBC, IRON, RETICCTPCT  in the last 72 hours. Microbiology No results found for this or any previous visit (from the past 240 hour(s)).    Studies:  No results found.  Assessment: 46 y.o. 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   (3)Treated neoadjuvantly with cyclophosphamide and docetaxel x4 completed 08/29/2011.  (4) status post right modified radical mastectomy 10/03/2011 showing a residual pT1c pN1a (3/18 lymph nodes positive) invasive ductal carcinoma, grade 1,estrogen receptor 100% positive, progesterone receptor negative, with no HER-2 amplification (SZA [id number])  (5) adjuvant radiation to the right chest wall, right supraclavicular fossa and right scar completed 12/26/2011  (6) tamoxifen started January 2014-- discontinued April 2016 with evidence of metastatic disease  (7) status post total hysterectomy with bilateral salpingo-oophorectomy 12/23/2012 with benign pathology ([id number])  METASTATIC DISEASE 04/13/2014 (8) presented with T8 cord compression and underwent laminectomy 04/13/2014 with T8 decompression of spinal cord, posterior fixation from T6-T10 with pedicle screws and rods, posterior arthrodesis with allograft. 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 ExacTrac Snap verification was performed for each couch angle.              (b) radiation to the T-spine completed 05/08/2014.             (c) [facility]             (d) SRS to 0.4 cm left parietal lesion 12/23/2015  (11) started fulvestrant 05/18/2014 and Palbociclib 06/01/2014             (a)  Palbociclib dose decreased to 100 mg daily, 21/7 as of 08/05/2014             (b) palbociclib dose decreased to 75 mg daily, 21/7, starting May 2017             (c) palbociclib dose decreased to 75 mg every other day beginning 07/26/2015               (d) palbociclib held for month of October because of persistent low counts, resumed November             (  e) fulvestrant discontinued after 11/02/2016 dose: Anastrozole started 08/01/2016  (f) switched to exemestane as of August 2018, everolimus to be added   (12) started zolendronate 05/18/2014, repeated every 12 weeks             (a) July 2017 zolendronate dose held because of dental issues, resumed 10/18/2015             (b) switched to denosumab as of 01/20/2016 with concern of bony progression on zolendronate             (c) switched back to zolendronate as of April 2018 at patient's request  (d) denosumab/xgeva resumed 09/05/2018with hypercalcemia  (13) most recent staging studies:             (a) brain MRI 07/13/2016 shows stable to smaller treated lesions.             (b) bone scan 04/06/2016 shows no new lesions             (c) chest CT scan 04/06/2016 shows small bilateral pulmonary nodules with no evidence of disease progression  (d) brain MRI stable to slightly improved  (14) hypercalcemia and increased bone pain 09/05/2016 c/w progression bone involvement  (a) s/p pamidronate 09/05/2016 (note received zolendronate 08/01/2016)  (b) xgeva was to be resumed 09/08/2016, to be repeated Q4W  Plan:  Oriel's Ca++ yesterday was again up; no labs so far today  I will write again for denosumab/xgeva--not clear why she did not receive it 08/31  She will start XRT today with daily treatments except weekends through 09/26/1016.  If discharged home I will recheck her Ca++ 09/07 and next week; she has a f/u with me 09/17  Please let me know if I cn be of further help   Chauncey Cruel, MD 09/13/2016  8:34 AM Medical Oncology and  Hematology [facility] [place], [place] [address] Tel. [phone]    Fax. [phone]

## 2016-09-14 ENCOUNTER — Telehealth: Payer: Self-pay

## 2016-09-14 ENCOUNTER — Ambulatory Visit
Admit: 2016-09-14 | Discharge: 2016-09-14 | Disposition: A | Discharge status: Home / Self Care | Payer: BC Managed Care – PPO | Attending: Radiation Oncology | Admitting: Radiation Oncology

## 2016-09-14 LAB — VITAMIN D 25 HYDROXY (VIT D DEFICIENCY, FRACTURES): VIT D 25 HYDROXY: 41.9 ng/mL (ref 30.0–100.0)

## 2016-09-14 LAB — CALCIUM: Calcium: 9.6 mg/dL (ref 8.9–10.3)

## 2016-09-14 MED ORDER — METHADONE HCL 5 MG PO TABS: 2.5000 mg | ORAL_TABLET | Freq: Three times a day (TID) | ORAL | 0 refills | Status: DC

## 2016-09-14 MED ORDER — EXEMESTANE 25 MG PO TABS: 25.0000 mg | ORAL_TABLET | Freq: Every day | ORAL | 0 refills | Status: DC

## 2016-09-14 MED ORDER — POLYETHYLENE GLYCOL 3350 17 G PO PACK: 17.0000 g | PACK | Freq: Every day | ORAL | 0 refills | Status: AC

## 2016-09-14 MED ORDER — OXYCODONE-ACETAMINOPHEN 5-325 MG PO TABS: 1.0000 | ORAL_TABLET | Freq: Four times a day (QID) | ORAL | 0 refills | Status: DC | PRN

## 2016-09-14 MED FILL — EXEMESTANE 25 MG TABLET: 25 | 30 days supply | Qty: 30 | Fill #0

## 2016-09-14 MED FILL — METHADONE HCL 5 MG TABLET: 5 | 20 days supply | Qty: 30 | Fill #0

## 2016-09-14 MED FILL — POLYETHYLENE GLYCOL 3350: 15 days supply | Qty: 255 | Fill #0

## 2016-09-14 NOTE — Telephone Encounter (Signed)
Pt called that she is being dc from hospital and needs a prescription.

## 2016-09-14 NOTE — Telephone Encounter (Signed)
Patient called for clarification on IBrance per Dr. [name] pt is to d/c this medication. Patient is aware.

## 2016-09-14 NOTE — Progress Notes (Signed)
Carolyn [name]   DOB:December 03, 1970   GM#:[id number]   UYQ#:[id number]  Subjective:  Carolyn [name] received denosumab/Xgeva yesterday w/o event; anticipate rapid drop in Ca++; labs today pending.-- She tells me her pain is well controlled on ehr current methadone dose. She is having regular BMs, not hard, on her current bowel prophylaxis regimen. Ambulated in halls several times yesterda. Started XRT, and tolerting it well so far. No family in room  Objective: middle aged African American woman examined in bed Vitals:   09/13/16 2222 09/14/16 0545  BP: 133/71 138/76  Pulse: (!) 101 97  Resp: 18 18  Temp: 99.7 F (37.6 C) 98.5 F (36.9 C)  SpO2: 93% 94%    Body mass index is 19.87 kg/m.  Intake/Output Summary (Last 24 hours) at 09/14/16 0812 Last data filed at 09/13/16 1800  Gross per 24 hour  Intake             1800 ml  Output             1500 ml  Net              300 ml    CBG (last 3)  No results for input(s): GLUCAP in the last 72 hours.   Labs:  Lab Results  Component Value Date   WBC 6.3 09/12/2016   HGB 9.4 (L) 09/12/2016   HCT 28.7 (L) 09/12/2016   MCV 88.6 09/12/2016   PLT 102 (L) 09/12/2016   NEUTROABS 3.2 09/05/2016    '@LASTCHEMISTRY' @  Urine Studies No results for input(s): UHGB, CRYS in the last 72 hours.  Invalid input(s): UACOL, UAPR, USPG, UPH, UTP, UGL, UKET, UBIL, UNIT, UROB, [place], UEPI, UWBC, [place], [place], [place], [place], [place]  Basic Metabolic Panel:  Recent Labs Lab 09/10/16 0425 09/11/16 0504 09/11/16 1635 09/12/16 0911 09/13/16 1043  NA 142 143 144 144 143  K 3.5 3.3* 2.9* 3.0* 3.5  CL 99* 101 100* 103 105  CO2 33* 33* 32 31 31  GLUCOSE 98 98 110* 101* 124*  BUN 23* 23* 21* 19 15  CREATININE 2.16* 2.21* 2.03* 2.13* 1.78*  CALCIUM 11.2* 11.9* 11.5* 11.9* 11.6*   GFR Estimated Creatinine Clearance: 36.6 mL/min (A) (by C-G formula based on SCr of 1.78 mg/dL (H)). Liver Function Tests:  Recent Labs Lab 09/08/16 0418 09/09/16 0501 09/10/16 0425  AST  32 52* 104*  ALT 13* 27 74*  ALKPHOS 108 121 126  BILITOT 0.5 0.5 0.5  PROT 6.1* 6.5 6.3*  ALBUMIN 3.1* 3.4* 3.2*   No results for input(s): LIPASE, AMYLASE in the last 168 hours. No results for input(s): AMMONIA in the last 168 hours. Coagulation profile No results for input(s): INR, PROTIME in the last 168 hours.  CBC:  Recent Labs Lab 09/08/16 0418 09/11/16 0504 09/12/16 0911  WBC 4.2 6.1 6.3  HGB 8.6* 8.7* 9.4*  HCT 25.3* 26.0* 28.7*  MCV 86.6 88.1 88.6  PLT 105* 111* 102*   Cardiac Enzymes: No results for input(s): CKTOTAL, CKMB, CKMBINDEX, TROPONINI in the last 168 hours. BNP: Invalid input(s): POCBNP CBG: No results for input(s): GLUCAP in the last 168 hours. D-Dimer No results for input(s): DDIMER in the last 72 hours. Hgb A1c No results for input(s): HGBA1C in the last 72 hours. Lipid Profile No results for input(s): CHOL, HDL, LDLCALC, TRIG, CHOLHDL, LDLDIRECT in the last 72 hours. Thyroid function studies No results for input(s): TSH, T4TOTAL, T3FREE, THYROIDAB in the last 72 hours.  Invalid input(s): FREET3 Anemia work up No  results for input(s): VITAMINB12, FOLATE, FERRITIN, TIBC, IRON, RETICCTPCT in the last 72 hours. Microbiology No results found for this or any previous visit (from the past 240 hour(s)).    Studies:  No results found.  Assessment: 46 y.o. 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              (a) VUS were also found in [place] and BARD1  (2) additional right breast biopsy upper inner quadrant 06/08/2011 showed only a  fibroadenoma, and central left breast biopsy for another suspicious lesion showed only fibrocystic changes (SAA [id number] and [id number]).   (3)Treated neoadjuvantly with cyclophosphamide and docetaxel x4 completed 08/29/2011.  (4) status post right modified radical mastectomy 10/03/2011 showing a residual pT1c pN1a (3/18 lymph nodes positive) invasive ductal carcinoma, grade 1,estrogen receptor 100% positive, progesterone receptor negative, with no HER-2 amplification (SZA [id number])  (5) adjuvant radiation to the right chest wall, right supraclavicular fossa and right scar completed 12/26/2011  (6) tamoxifen started January 2014-- discontinued April 2016 with evidence of metastatic disease  (7) status post total hysterectomy with bilateral salpingo-oophorectomy 12/23/2012 with benign pathology ([id number])  METASTATIC DISEASE 04/13/2014 (8) presented with T8 cord compression and underwent laminectomy 04/13/2014 with T8 decompression of spinal cord, posterior fixation from T6-T10 with pedicle screws and rods, posterior arthrodesis with allograft. 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 ExacTrac Snap verification was performed for each couch angle.              (b) radiation to the T-spine completed 05/08/2014.             (c) [facility]             (d) SRS to 0.4 cm left parietal lesion 12/23/2015  (11) started  fulvestrant 05/18/2014 and Palbociclib 06/01/2014             (a) Palbociclib dose decreased to 100 mg daily, 21/7 as of 08/05/2014             (b) palbociclib dose decreased to 75 mg daily, 21/7, starting May 2017             (c) palbociclib dose decreased to 75 mg every other day beginning 07/26/2015               (d) palbociclib held for month of October because of persistent low counts, resumed November             (  e) fulvestrant discontinued after 11/02/2016 dose: Anastrozole started 08/01/2016  (f) switched to exemestane as of August 2018, everolimus to be added   (12) started zolendronate 05/18/2014, repeated every 12 weeks             (a) July 2017 zolendronate dose held because of dental issues, resumed 10/18/2015             (b) switched to denosumab as of 01/20/2016 with concern of bony progression on zolendronate             (c) switched back to zolendronate as of April 2018 at patient's request  (d) denosumab/xgeva resumed 09/13/2016 with hypercalcemia, repeat Q 28d  (13) most recent staging studies:             (a) brain MRI 07/13/2016 shows stable to smaller treated lesions.             (b) bone scan 04/06/2016 shows no new lesions             (c) chest CT scan 04/06/2016 shows small bilateral pulmonary nodules with no evidence of disease progression  (d) brain MRI stable to slightly improved  (14) hypercalcemia and increased bone pain 09/05/2016 c/w progression bone involvement  (a) s/p pamidronate 09/05/2016 (note received zolendronate 08/01/2016)  (b) xgeva was to be resumed 09/08/2016, to be repeated Q4W  Plan:  [facility] labs are pending today. With the denosumab dose yesterday I anticipate a rapid drop in her Ca++.Clinically she feels ready to go home. Please continue her current methadone and bowel prophylaxis regimen at d/c. She is schedued for daily XRT through 09/18 and to see me 09/17  Please let me know if I can be of further help at this  point   Chauncey Cruel, MD 09/14/2016  8:12 AM Medical Oncology and Hematology [facility] [place], [place] [address] Tel. [phone]    Fax. [phone]

## 2016-09-15 ENCOUNTER — Telehealth: Payer: Self-pay | Admitting: Pharmacy Technician

## 2016-09-15 ENCOUNTER — Ambulatory Visit
Admission: RE | Admit: 2016-09-15 | Discharge: 2016-09-15 | Disposition: A | Discharge status: Home / Self Care | Payer: BC Managed Care – PPO | Source: Ambulatory Visit | Attending: Radiation Oncology | Admitting: Radiation Oncology

## 2016-09-15 DIAGNOSIS — C7951 Secondary malignant neoplasm of bone: Secondary | ICD-10-CM | POA: Diagnosis not present

## 2016-09-15 DIAGNOSIS — Z853 Personal history of malignant neoplasm of breast: Secondary | ICD-10-CM | POA: Diagnosis present

## 2016-09-15 DIAGNOSIS — C7931 Secondary malignant neoplasm of brain: Secondary | ICD-10-CM | POA: Diagnosis not present

## 2016-09-15 NOTE — Progress Notes (Signed)
  Radiation Oncology         ([phone] ________________________________  Name: Carolyn [name] MRN: [id number]  Date: 09/12/2016  DOB: August 02, 1970  SIMULATION AND TREATMENT PLANNING NOTE  DIAGNOSIS:     ICD-10-CM   1. Bone metastases ([place]) C79.51      Site:   1.  L5 2.  Left pelvis  NARRATIVE:  The patient was brought to the [place].  Identity was confirmed.  All relevant records and images related to the planned course of therapy were reviewed.   Written consent to proceed with treatment was confirmed which was freely given after reviewing the details related to the planned course of therapy had been reviewed with the patient.  Then, the patient was set-up in a stable reproducible  supine position for radiation therapy.  CT images were obtained.  Surface markings were placed.    Medically necessary complex treatment device(s) for immobilization:  Customized vac lock bag.   The CT images were loaded into the planning software.  Then the target and avoidance structures were contoured.  Treatment planning then occurred.  The radiation prescription was entered and confirmed.  A total of 6 complex treatment devices were fabricated which relate to the designed radiation treatment fields: 4 customized fields to treat the L5 level and 2 fields to treat the left pelvis . Each of these customized fields/ complex treatment devices will be used on a daily basis during the radiation course. I have requested : Isodose Plan.   PLAN:  The patient will receive 30  Gy in 10  fractions.  ________________________________   [name], MD, PhD

## 2016-09-15 NOTE — Telephone Encounter (Signed)
Oral Oncology Patient Advocate Encounter  Received a call from a [place] that the patient was concerned with her copayment costs on the new prescription for exemestane.    I was able to obtain a copay card for the patient that will reduce her costs to $4 monthly for BRAND Aromasin.    I spoke with the patient.  She has already picked up the exemestane from the pharmacy but understands the plan for next month's prescription.    She will be receiving the copay card in the mail within the next week.  I have also shared the billing information with the pharmacy.  It is as follows:   Member ID: [id number] Group: [id number] BIN: [id number]  I will coordinate with her and the pharmacy when it is time for her next refill to make sure that everything goes according to plan.    Fabio Asa. Melynda Keller, [place] Patient [place] [phone] 09/15/2016 2:48 PM

## 2016-09-16 NOTE — Discharge Summary (Signed)
Physician Discharge Summary  Carolyn [name] AST:[id number] DOB: 09-28-1970 DOA: 09/05/2016  PCP: Patient, No Pcp Per  Admit date: 09/05/2016 Discharge date: 09/16/2016  Admitted FROM HOME Disposition: HOME  Recommendations for Outpatient Follow-up:  1. Follow up with PCP AND ONCOLOGIST IN 1 WEEK 2. Please obtain BMP/CBC in one weeK  Home Health Equipment/Devices:  Discharge Condition: CODE STATUS:  Brief/Interim Summary: 46 yo with metastatic breast cancer admitted with hypercalcemia.treated with ivf,lasix,pamidronate and denosumab.calcium levels down.patient to follow up with onc for continued treatment.  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  Discharge Instructions   Allergies as of 09/14/2016      Reactions   Allegra [fexofenadine] Hives   Abdomen only   Cat Hair Extract Other (See Comments)   Shellfish Allergy Hives   Abdomen only   Glucosamine Rash      Medication List    STOP taking these medications   acetaminophen 325 MG tablet Commonly known as:  TYLENOL   anastrozole 1 MG tablet Commonly known as:  ARIMIDEX   ibuprofen 800 MG tablet Commonly known as:  ADVIL,MOTRIN     TAKE these medications   CALCIUM 1200 PO Take 1 tablet by mouth 2 (two) times daily.   exemestane 25 MG tablet Commonly known as:  AROMASIN Take 1 tablet (25 mg total) by mouth daily after breakfast.   IBRANCE 75 MG capsule Generic drug:  palbociclib TAKE 1 CAPSULE BY MOUTH EVERY OTHER DAY FOR 21 DAYS THEN OFF A WEEK   methadone 5 MG tablet Commonly known as:  DOLOPHINE Take 0.5 tablets (2.5 mg total) by mouth every 8 (eight) hours.   multivitamin tablet Take 1 tablet by mouth  daily.   polyethylene glycol packet Commonly known as:  MIRALAX / GLYCOLAX Take 17 g by mouth daily. Notes to patient:  Take tomorrow 8/7   traMADol 50 MG tablet Commonly known as:  ULTRAM Take 1 tablet (50 mg total) by mouth every 6 (six) hours as needed.   Vitamin D3 2000 units Tabs Take 2,000 Units by mouth daily.            Discharge Care Instructions        Start     Ordered   09/15/16 0000  polyethylene glycol (MIRALAX / GLYCOLAX) packet  Daily     09/14/16 1228   09/15/16 0000  exemestane (AROMASIN) 25 MG tablet  Daily after breakfast     09/14/16 1232   09/14/16 0000  methadone (DOLOPHINE) 5 MG tablet  Every 8 hours     09/14/16 1228      Allergies  Allergen Reactions  . Allegra [Fexofenadine] Hives    Abdomen only  . Cat Hair Extract Other (See Comments)  . Shellfish Allergy Hives    Abdomen only  . Glucosamine Rash    Consultations:onc   Procedures/Studies: Dg Chest 2 View  Result Date: 09/05/2016 CLINICAL DATA:  Lumbar pain radiating to the legs. EXAM: CHEST  2 VIEW COMPARISON:  June 17, 2014 FINDINGS: There is fullness of the left suprahilar region. The heart size normal. There is no focal infiltrate, pulmonary edema, or pleural effusion P Spinal rods are noted unchanged. Prior cholecystectomy clips are  noted. IMPRESSION: Fullness of left suprahilar region. Further evaluation with a chest CT on outpatient basis is recommended to exclude mass. Electronically Signed   By: Abelardo Diesel M.D.   On: 09/05/2016 19:48   Mr Thoracic Spine W Wo Contrast  Result Date: 09/05/2016 CLINICAL DATA:  46 y/o  F; 329, image 5 EXAM: MRI THORACIC AND LUMBAR SPINE WITHOUT AND WITH CONTRAST TECHNIQUE: Multiplanar and multiecho pulse sequences of the thoracic and lumbar spine were obtained without and with intravenous contrast. CONTRAST:  31mL MULTIHANCE GADOBENATE DIMEGLUMINE 529 MG/ML IV SOLN COMPARISON:  04/06/2016, 01/14/2016, 05/03/2015 bone scan. 04/12/2014 lumbar and  thoracic spine MRI FINDINGS: MRI THORACIC SPINE FINDINGS Alignment:  Physiologic. Vertebrae: T6-T10 posterior instrumented fusion and T8 laminectomy. Susceptibility artifact from the fusion hardware partially obscures the vertebral bodies at those levels. Stable moderate compression deformity of the T8 vertebral body. Vertebral body heights are otherwise well-preserved. There is heterogeneous bone marrow signal within the T7, T8, T9, and T12 vertebral bodies corresponding to prior enhancing lesions without definite enhancement on the current study, probably representing treated metastasis. Mild enhancement within the posterior T11 and posterior T12 vertebral bodies consistent with residual metastasis. Additionally, there is enhancement within the T8 vertebral body greater on the right and extending into the pedicle compatible with residual metastasis. Cord: No abnormal cord signal or enhancement. The spinal cord compression. No epidural disease identified. Paraspinal and other soft tissues: Subcentimeter pulmonary nodules in the right lung base (series 9, image 34) and indeterminate T2 hyperintense lesions in the right lobe of liver (series 9, image 47). Disc levels: No significant disc displacement or canal stenosis. MRI LUMBAR SPINE FINDINGS Segmentation:  Standard. Alignment:  Physiologic. Vertebrae: The diffusely heterogeneous lumbar spine and pelvis bone marrow signal with enhancement compatible with osseous metastatic disease. Metastatic disease is most pronounced at the T5 vertebral body with a for epidural disease effaces the anterior thecal space and enters the bilateral neural foramen greater on the left. No loss of vertebral body height. Conus medullaris: Extends to the L1-2 level and appears normal. No abnormal enhancement. Paraspinal and other soft tissues: Negative. Disc levels: No significant disc displacement. Mild lower lumbar facet hypertrophy. Anterior epidural disease arising from the L5  vertebral body mildly narrows the spinal canal, effaces the lateral recesses with contact upon descending bilateral S1 nerve roots, moderately narrows the left and mildly narrows the right neural foramen. IMPRESSION: MRI thoracic spine: 1. Diffuse osseous metastasis with residual enhancing disease visualized at the T8, T11, and T12 vertebral body levels. No epidural disease. 2. Stable moderate compression deformity of T8 vertebral body. No new loss of vertebral body height. 3. Subcentimeter pulmonary nodules and indeterminate liver lesions may represent metastasis. MRI lumbar spine: 1. Diffuse osseous metastasis of lumbar spine and visible pelvis. 2. No loss of vertebral body height. 3. Anterior epidural disease at T5 results in moderate canal stenosis, lateral recess effacement with contact upon descending S1 nerve roots, and moderate left/mild right neural foraminal stenosis. Electronically Signed   By: Kristine Garbe M.D.   On: 09/05/2016 19:42   Mr Lumbar Spine W Wo Contrast  Result Date: 09/05/2016 CLINICAL DATA:  46 y/o  F; 329, image 5 EXAM: MRI THORACIC AND LUMBAR SPINE WITHOUT AND WITH CONTRAST TECHNIQUE: Multiplanar and multiecho pulse sequences of the thoracic and lumbar spine were obtained without and with intravenous contrast. CONTRAST:  46mL MULTIHANCE GADOBENATE DIMEGLUMINE 529 MG/ML IV SOLN COMPARISON:  04/06/2016, 01/14/2016, 05/03/2015 bone scan. 04/12/2014 lumbar and thoracic spine MRI FINDINGS:  MRI THORACIC SPINE FINDINGS Alignment:  Physiologic. Vertebrae: T6-T10 posterior instrumented fusion and T8 laminectomy. Susceptibility artifact from the fusion hardware partially obscures the vertebral bodies at those levels. Stable moderate compression deformity of the T8 vertebral body. Vertebral body heights are otherwise well-preserved. There is heterogeneous bone marrow signal within the T7, T8, T9, and T12 vertebral bodies corresponding to prior enhancing lesions without definite  enhancement on the current study, probably representing treated metastasis. Mild enhancement within the posterior T11 and posterior T12 vertebral bodies consistent with residual metastasis. Additionally, there is enhancement within the T8 vertebral body greater on the right and extending into the pedicle compatible with residual metastasis. Cord: No abnormal cord signal or enhancement. The spinal cord compression. No epidural disease identified. Paraspinal and other soft tissues: Subcentimeter pulmonary nodules in the right lung base (series 9, image 34) and indeterminate T2 hyperintense lesions in the right lobe of liver (series 9, image 47). Disc levels: No significant disc displacement or canal stenosis. MRI LUMBAR SPINE FINDINGS Segmentation:  Standard. Alignment:  Physiologic. Vertebrae: The diffusely heterogeneous lumbar spine and pelvis bone marrow signal with enhancement compatible with osseous metastatic disease. Metastatic disease is most pronounced at the T5 vertebral body with a for epidural disease effaces the anterior thecal space and enters the bilateral neural foramen greater on the left. No loss of vertebral body height. Conus medullaris: Extends to the L1-2 level and appears normal. No abnormal enhancement. Paraspinal and other soft tissues: Negative. Disc levels: No significant disc displacement. Mild lower lumbar facet hypertrophy. Anterior epidural disease arising from the L5 vertebral body mildly narrows the spinal canal, effaces the lateral recesses with contact upon descending bilateral S1 nerve roots, moderately narrows the left and mildly narrows the right neural foramen. IMPRESSION: MRI thoracic spine: 1. Diffuse osseous metastasis with residual enhancing disease visualized at the T8, T11, and T12 vertebral body levels. No epidural disease. 2. Stable moderate compression deformity of T8 vertebral body. No new loss of vertebral body height. 3. Subcentimeter pulmonary nodules and  indeterminate liver lesions may represent metastasis. MRI lumbar spine: 1. Diffuse osseous metastasis of lumbar spine and visible pelvis. 2. No loss of vertebral body height. 3. Anterior epidural disease at T5 results in moderate canal stenosis, lateral recess effacement with contact upon descending S1 nerve roots, and moderate left/mild right neural foraminal stenosis. Electronically Signed   By: Kristine Garbe M.D.   On: 09/05/2016 19:42   US Renal  Result Date: 09/06/2016 CLINICAL DATA:  Left-sided flank pain, acute renal failure EXAM: RENAL / URINARY TRACT ULTRASOUND COMPLETE COMPARISON:  12/16/2014 FINDINGS: Right Kidney: Length: 11.2 cm. Echogenicity within normal limits. No mass or hydronephrosis visualized. Left Kidney: Length: 11.6 cm. Echogenicity within normal limits. No mass or hydronephrosis visualized. Bladder: Appears normal for degree of bladder distention. Note is made of a small left-sided pleural effusion. IMPRESSION: No acute renal abnormality is noted. Small left pleural effusion is noted. Electronically Signed   By: Inez Catalina M.D.   On: 09/06/2016 07:37   Ct Hip Left Wo Contrast  Result Date: 09/06/2016 CLINICAL DATA:  Left hip pain.  History of metastatic breast cancer. EXAM: CT OF THE LEFT HIP WITHOUT CONTRAST TECHNIQUE: Multidetector CT imaging of the left hip was performed according to the standard protocol. Multiplanar CT image reconstructions were also generated. COMPARISON:  Bone scan dated April 06, 2016. CT abdomen pelvis dated December 16, 2014. FINDINGS: Bones/Joint/Cartilage Several pericentimeter mixed lytic and sclerotic lesions are seen in the left hemipelvis: A 13  mm lesion at the left puboacetabular junction, a 13 mm lesion in the posterior ischium, and a 13 mm lesion in the parasymphyseal left pubic bone. Partially visualized 12 mm predominately lytic lesion in the left sacral ala. Additional small subcentimeter lytic lesions are seen in the posterior  ischium (series 2, image 21), left iliac wing (series 4, image 16), and the left acetabulum (series 4, image 26). These lesions correspond to areas of increased uptake on the prior bone scan, but were not present on prior CT from December 2016. No lesion is seen in the left proximal femur. No acute fracture or malalignment. The left hip joint space is relatively preserved. Ligaments Suboptimally assessed by CT. Muscles and Tendons No focal abnormality. Soft tissues Unremarkable. 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 No fracture. Electronically Signed   By: Titus Dubin M.D.   On: 09/06/2016 15:09   Nm Pulmonary Vent And Perf (v/q Scan)  Result Date: 09/05/2016 CLINICAL DATA:  Lumbar pain, positive D-dimer EXAM: NUCLEAR MEDICINE VENTILATION - PERFUSION LUNG SCAN TECHNIQUE: Ventilation images were obtained in multiple projections using inhaled aerosol Tc-56m DTPA. Perfusion images were obtained in multiple projections after intravenous injection of Tc-77m MAA. RADIOPHARMACEUTICALS:  32 mCi Technetium-8m DTPA aerosol inhalation and 3.94 mCi Technetium-74m MAA IV COMPARISON:  Chest x-ray 09/05/2016 FINDINGS: Ventilation: Heterogenous ventilation within the upper lobes. Central airways clumping of activity. Perfusion: Heterogenous perfusion. Multiple small relatively peripheral filling defects within the upper lobes similar in distribution to heterogeneous ventilation. IMPRESSION: Heterogenous profusion with multiple small matched defects in the upper lobes. Overall low probability for pulmonary embolus. Electronically Signed   By: Donavan Foil M.D.   On: 09/05/2016 22:03    (Echo, Carotid, EGD, Colonoscopy, ERCP)    Subjective:   Discharge Exam: Vitals:   09/14/16 0545 09/14/16 1449  BP: 138/76 129/78  Pulse: 97 95  Resp: 18 19  Temp: 98.5 F (36.9 C) 98.6 F (37 C)  SpO2: 94% 94%   Vitals:   09/13/16 1522 09/13/16 2222 09/14/16 0545 09/14/16 1449  BP: (!)  178/81 133/71 138/76 129/78  Pulse: (!) 119 (!) 101 97 95  Resp: 17 18 18 19   Temp: 98 F (36.7 C) 99.7 F (37.6 C) 98.5 F (36.9 C) 98.6 F (37 C)  TempSrc: Oral Oral Oral Oral  SpO2: 93% 93% 94% 94%  Weight:   59.3 kg (130 lb 11.2 oz)   Height:        General: Pt is alert, awake, not in acute distress Cardiovascular: RRR, S1/S2 +, no rubs, no gallops Respiratory: CTA bilaterally, no wheezing, no rhonchi Abdominal: Soft, NT, ND, bowel sounds + Extremities: no edema, no cyanosis    The results of significant diagnostics from this hospitalization (including imaging, microbiology, ancillary and laboratory) are listed below for reference.     Microbiology: No results found for this or any previous visit (from the past 240 hour(s)).   Labs: BNP (last 3 results) No results for input(s): BNP in the last 8760 hours. Basic Metabolic Panel:  Recent Labs Lab 09/10/16 0425 09/11/16 0504 09/11/16 1635 09/12/16 0911 09/13/16 1043 09/14/16 1428  NA 142 143 144 144 143  --   K 3.5 3.3* 2.9* 3.0* 3.5  --   CL 99* 101 100* 103 105  --   CO2 33* 33* 32 31 31  --   GLUCOSE 98 98 110* 101* 124*  --   BUN 23* 23* 21* 19 15  --   CREATININE 2.16*  2.21* 2.03* 2.13* 1.78*  --   CALCIUM 11.2* 11.9* 11.5* 11.9* 11.6* 9.6   Liver Function Tests:  Recent Labs Lab 09/10/16 0425  AST 104*  ALT 74*  ALKPHOS 126  BILITOT 0.5  PROT 6.3*  ALBUMIN 3.2*   No results for input(s): LIPASE, AMYLASE in the last 168 hours. No results for input(s): AMMONIA in the last 168 hours. CBC:  Recent Labs Lab 09/11/16 0504 09/12/16 0911  WBC 6.1 6.3  HGB 8.7* 9.4*  HCT 26.0* 28.7*  MCV 88.1 88.6  PLT 111* 102*   Cardiac Enzymes: No results for input(s): CKTOTAL, CKMB, CKMBINDEX, TROPONINI in the last 168 hours. BNP: Invalid input(s): POCBNP CBG: No results for input(s): GLUCAP in the last 168 hours. D-Dimer No results for input(s): DDIMER in the last 72 hours. Hgb A1c No results for  input(s): HGBA1C in the last 72 hours. Lipid Profile No results for input(s): CHOL, HDL, LDLCALC, TRIG, CHOLHDL, LDLDIRECT in the last 72 hours. Thyroid function studies No results for input(s): TSH, T4TOTAL, T3FREE, THYROIDAB in the last 72 hours.  Invalid input(s): FREET3 Anemia work up No results for input(s): VITAMINB12, FOLATE, FERRITIN, TIBC, IRON, RETICCTPCT in the last 72 hours. Urinalysis    Component Value Date/Time   COLORURINE STRAW (A) 09/05/2016 2000   APPEARANCEUR HAZY (A) 09/05/2016 2000   LABSPEC 1.009 09/05/2016 2000   LABSPEC 1.015 07/15/2014 1000   PHURINE 7.0 09/05/2016 2000   GLUCOSEU NEGATIVE 09/05/2016 2000   GLUCOSEU Negative 07/15/2014 1000   HGBUR NEGATIVE 09/05/2016 2000   BILIRUBINUR NEGATIVE 09/05/2016 2000   BILIRUBINUR Negative 07/15/2014 1000   KETONESUR NEGATIVE 09/05/2016 2000   PROTEINUR NEGATIVE 09/05/2016 2000   UROBILINOGEN 0.2 07/15/2014 1000   NITRITE NEGATIVE 09/05/2016 2000   LEUKOCYTESUR NEGATIVE 09/05/2016 2000   LEUKOCYTESUR Small 07/15/2014 1000   Sepsis Labs Invalid input(s): PROCALCITONIN,  WBC,  LACTICIDVEN Microbiology No results found for this or any previous visit (from the past 240 hour(s)).   Time coordinating discharge: Over 30 minutes  SIGNED:   Georgette Shell, MD  [facility] 09/16/2016, 9:57 AM Pager   If 7PM-7AM, please contact night-coverage [email or web address] Password [email or web address]

## 2016-09-18 ENCOUNTER — Other Ambulatory Visit: Payer: Self-pay | Admitting: Radiation Oncology

## 2016-09-18 ENCOUNTER — Ambulatory Visit: Payer: BC Managed Care – PPO | Admitting: Radiation Oncology

## 2016-09-18 ENCOUNTER — Ambulatory Visit
Admission: RE | Admit: 2016-09-18 | Discharge: 2016-09-18 | Disposition: A | Discharge status: Home / Self Care | Payer: BC Managed Care – PPO | Source: Ambulatory Visit | Attending: Radiation Oncology | Admitting: Radiation Oncology

## 2016-09-18 DIAGNOSIS — C7951 Secondary malignant neoplasm of bone: Secondary | ICD-10-CM

## 2016-09-18 MED ORDER — ONDANSETRON HCL 8 MG PO TABS: 8.0000 mg | ORAL_TABLET | Freq: Three times a day (TID) | ORAL | 1 refills | Status: DC | PRN

## 2016-09-18 MED ORDER — ONDANSETRON HCL 4 MG PO TABS
8.0000 mg | ORAL_TABLET | Freq: Once | ORAL | Status: AC
  Administered 2016-09-18: 4 mg via ORAL
  Filled 2016-09-18: qty 2

## 2016-09-18 MED FILL — ONDANSETRON HCL 8 MG TAB: 8 | 21 days supply | Qty: 18 | Fill #0

## 2016-09-18 NOTE — Addendum Note (Signed)
Encounter addended by: Doreen Beam, RN on: 09/18/2016  9:49 AM<BR>    Actions taken: Charge Capture section accepted

## 2016-09-18 NOTE — Progress Notes (Signed)
Patient has had 4/10 rad txs L-spine and Lt pelvis, c/o nausea, and pain,stiffness when walking, using w/c here, Shona Simpson ordered 8mg  zofran, gave 2 tablets of 4mg  zofran, no 8mg  in our pixis, okay per alison, also offered water/ging[place], pt education done again,Radiation therapy and you book, and my business card and sonafine cream to use daily after radiation, nothing 4 hours prior to treatment, this is for skin irritation,fatigue, diarrhea, bladder changes, diarrhea ,symptoms to look out for, patient has had radiation to her breast previously, knows to not use heating pad on abdomen or back, or ice packs while receiving radiation could cause more irritation to her skin, use unscented dove soap, luke warm bath/shower, no rubbing,scrubbing or scratching areas that are treated, may need to eat 5-6 smaller meals and nacks in between, take her nausea med 30 min before rad txs, and 30 min before her main meals, clear liquids ,no fresh fruit or fresh vegs with diarrhea, imodium prn, increase fluids, and boost for extra calories, protein in diet, use baaby wipes if having diarrhea, , teavjh back given+

## 2016-09-18 NOTE — Progress Notes (Addendum)
The patient was seen today for a work in due to needing a prescription for an antiemetic. She was seen on Friday and a prescription was being called in but unfortunately didn't arrive at her pharmacy. She notes nausea 2-3 hours following radiotherapy. She denies emesis today, but has been on the brink of this several times. She does not have any antiemetics at home currently. She also reports she's had more trouble in the last few days with her low pelvic/hip pain and has been taking her methadone as previously prescribed. She also takes tramadol for breakthrough relief, and is taking one tablet at a time. She's only had 4/10 fractions of radiotherapy. I encouraged her to try and increase her Tramadol to 2 tabs at a time, and if this doesn't work we could change her breakthrough medication. She wants to remain as alert as possible, so we will hope this improves her symptoms, however if they are not improving by mid week next week, we could consider changing her methadone dosing as well. She will keep me informed of her progress. A prescription for Zofran was also called into her pharmacy.    Carola Rhine, PAC

## 2016-09-19 ENCOUNTER — Ambulatory Visit
Admission: RE | Admit: 2016-09-19 | Discharge: 2016-09-19 | Disposition: A | Discharge status: Home / Self Care | Payer: BC Managed Care – PPO | Source: Ambulatory Visit | Attending: Radiation Oncology | Admitting: Radiation Oncology

## 2016-09-19 DIAGNOSIS — C7951 Secondary malignant neoplasm of bone: Secondary | ICD-10-CM | POA: Diagnosis not present

## 2016-09-20 ENCOUNTER — Ambulatory Visit
Admission: RE | Admit: 2016-09-20 | Discharge: 2016-09-20 | Disposition: A | Discharge status: Home / Self Care | Payer: BC Managed Care – PPO | Source: Ambulatory Visit | Attending: Radiation Oncology | Admitting: Radiation Oncology

## 2016-09-20 DIAGNOSIS — C7951 Secondary malignant neoplasm of bone: Secondary | ICD-10-CM | POA: Diagnosis not present

## 2016-09-21 ENCOUNTER — Ambulatory Visit
Admission: RE | Admit: 2016-09-21 | Discharge: 2016-09-21 | Disposition: A | Discharge status: Home / Self Care | Payer: BC Managed Care – PPO | Source: Ambulatory Visit | Attending: Radiation Oncology | Admitting: Radiation Oncology

## 2016-09-21 DIAGNOSIS — C7951 Secondary malignant neoplasm of bone: Secondary | ICD-10-CM | POA: Diagnosis not present

## 2016-09-22 ENCOUNTER — Ambulatory Visit
Admission: RE | Admit: 2016-09-22 | Discharge: 2016-09-22 | Disposition: A | Discharge status: Home / Self Care | Payer: BC Managed Care – PPO | Source: Ambulatory Visit | Attending: Radiation Oncology | Admitting: Radiation Oncology

## 2016-09-22 DIAGNOSIS — C7951 Secondary malignant neoplasm of bone: Secondary | ICD-10-CM | POA: Diagnosis not present

## 2016-09-25 ENCOUNTER — Telehealth: Payer: Self-pay | Admitting: Pharmacy Technician

## 2016-09-25 ENCOUNTER — Other Ambulatory Visit (HOSPITAL_BASED_OUTPATIENT_CLINIC_OR_DEPARTMENT_OTHER): Payer: BC Managed Care – PPO

## 2016-09-25 ENCOUNTER — Telehealth: Payer: Self-pay | Admitting: Oncology

## 2016-09-25 ENCOUNTER — Ambulatory Visit (HOSPITAL_BASED_OUTPATIENT_CLINIC_OR_DEPARTMENT_OTHER): Payer: BC Managed Care – PPO | Admitting: Oncology

## 2016-09-25 ENCOUNTER — Ambulatory Visit
Admission: RE | Admit: 2016-09-25 | Discharge: 2016-09-25 | Disposition: A | Discharge status: Home / Self Care | Payer: BC Managed Care – PPO | Source: Ambulatory Visit | Attending: Radiation Oncology | Admitting: Radiation Oncology

## 2016-09-25 VITALS — BP 159/70 | HR 116 | Temp 97.5°F | Resp 17 | Ht 68.0 in | Wt 129.3 lb

## 2016-09-25 DIAGNOSIS — K769 Liver disease, unspecified: Secondary | ICD-10-CM | POA: Diagnosis not present

## 2016-09-25 DIAGNOSIS — C7931 Secondary malignant neoplasm of brain: Secondary | ICD-10-CM | POA: Diagnosis not present

## 2016-09-25 DIAGNOSIS — N951 Menopausal and female climacteric states: Secondary | ICD-10-CM

## 2016-09-25 DIAGNOSIS — C50919 Malignant neoplasm of unspecified site of unspecified female breast: Secondary | ICD-10-CM

## 2016-09-25 DIAGNOSIS — C773 Secondary and unspecified malignant neoplasm of axilla and upper limb lymph nodes: Secondary | ICD-10-CM | POA: Diagnosis not present

## 2016-09-25 DIAGNOSIS — C50411 Malignant neoplasm of upper-outer quadrant of right female breast: Secondary | ICD-10-CM | POA: Diagnosis not present

## 2016-09-25 DIAGNOSIS — R110 Nausea: Secondary | ICD-10-CM

## 2016-09-25 DIAGNOSIS — R918 Other nonspecific abnormal finding of lung field: Secondary | ICD-10-CM | POA: Diagnosis not present

## 2016-09-25 DIAGNOSIS — C7951 Secondary malignant neoplasm of bone: Secondary | ICD-10-CM

## 2016-09-25 DIAGNOSIS — C50911 Malignant neoplasm of unspecified site of right female breast: Secondary | ICD-10-CM

## 2016-09-25 DIAGNOSIS — Z170 Estrogen receptor positive status [ER+]: Secondary | ICD-10-CM

## 2016-09-25 LAB — COMPREHENSIVE METABOLIC PANEL
ALT: 18 U/L (ref 0–55)
AST: 27 U/L (ref 5–34)
Albumin: 3 g/dL — ABNORMAL LOW (ref 3.5–5.0)
Alkaline Phosphatase: 233 U/L — ABNORMAL HIGH (ref 40–150)
Anion Gap: 12 mEq/L — ABNORMAL HIGH (ref 3–11)
BUN: 10 mg/dL (ref 7.0–26.0)
CALCIUM: 10.3 mg/dL (ref 8.4–10.4)
CHLORIDE: 104 meq/L (ref 98–109)
CO2: 25 meq/L (ref 22–29)
CREATININE: 1 mg/dL (ref 0.6–1.1)
EGFR: 77 mL/min/{1.73_m2} — ABNORMAL LOW (ref >90)
Glucose: 126 mg/dl (ref 70–140)
Potassium: 4 mEq/L (ref 3.5–5.1)
Sodium: 141 mEq/L (ref 136–145)
Total Bilirubin: 0.46 mg/dL (ref 0.20–1.20)
Total Protein: 8 g/dL (ref 6.4–8.3)

## 2016-09-25 LAB — CBC WITH DIFFERENTIAL/PLATELET
BASO%: 0.4 % (ref 0.0–2.0)
Basophils Absolute: 0 10*3/uL (ref 0.0–0.1)
EOS%: 3.7 % (ref 0.0–7.0)
Eosinophils Absolute: 0.2 10*3/uL (ref 0.0–0.5)
HEMATOCRIT: 25.4 % — AB (ref 34.8–46.6)
HGB: 8.6 g/dL — ABNORMAL LOW (ref 11.6–15.9)
LYMPH#: 0.6 10*3/uL — AB (ref 0.9–3.3)
LYMPH%: 9.5 % — ABNORMAL LOW (ref 14.0–49.7)
MCH: 29.3 pg (ref 25.1–34.0)
MCHC: 33.8 g/dL (ref 31.5–36.0)
MCV: 86.7 fL (ref 79.5–101.0)
MONO#: 0.6 10*3/uL (ref 0.1–0.9)
MONO%: 9.1 % (ref 0.0–14.0)
NEUT%: 77.3 % — AB (ref 38.4–76.8)
NEUTROS ABS: 5 10*3/uL (ref 1.5–6.5)
Platelets: 147 10*3/uL (ref 145–400)
RBC: 2.92 10*6/uL — AB (ref 3.70–5.45)
RDW: 18.5 % — ABNORMAL HIGH (ref 11.2–14.5)
WBC: 6.4 10*3/uL (ref 3.9–10.3)

## 2016-09-25 LAB — TECHNOLOGIST REVIEW: Technologist Review: 1

## 2016-09-25 MED ORDER — GABAPENTIN 300 MG PO CAPS: 300.0000 mg | ORAL_CAPSULE | Freq: Every day | ORAL | 4 refills | Status: DC

## 2016-09-25 MED ORDER — EVEROLIMUS 10 MG PO TABS: 10.0000 mg | ORAL_TABLET | Freq: Every day | ORAL | 6 refills | Status: DC

## 2016-09-25 MED FILL — GABAPENTIN 300 MG CAPSULE: 300 | 90 days supply | Qty: 90 | Fill #0

## 2016-09-25 NOTE — Telephone Encounter (Signed)
Gave patient avs and calendar with appts.  °

## 2016-09-25 NOTE — Progress Notes (Signed)
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 She had been receiving fulvestrant and palbociclib, but developed significant bony pain and was admitted to the hospital with hypercalcemia of malignancy 09/05/2016. She was treated initially with pamidronate without resolution then Xgeva, first dose 09/08/2016, with good results. Her fulvestrant and palbociclib have been stopped and exemestane was started. The plan is to. That up with everolimus.  Her next brain MRI is scheduled for 10/19/2016.  She reports that she is having night sweats on Aromasin and increased vaginal dryness. Pt notes that she used to pay $30 and now pays $4. She notes that she is currently staying with her sister. Pt reports that she has two more radiation treatments to her hip and notes that she is now able to get out of bed.    REVIEW OF SYSTEMS: For her bone pain she was started on methadone at 5 mg 3 times a day while in the hospital. This made her a little bit sleepy and nauseated. Accordingly she is currently receiving 2.5 mg 3 times a day. She also has tramadol available, but hasn't been taking it. Carolyn [name] reports nausea and hoarse voice. She reports  abdominal cramping. She denies unusual headaches, visual changes, vomiting, or dizziness. There has been no unusual cough, phlegm production, or pleurisy. This been no change in bowel or bladder habits. She denies unexplained fatigue or unexplained weight loss, bleeding, rash, or fever. Carolyn [name] continues to work and enjoys her work. A detailed review of systems was otherwise negative.    BREAST CANCER HISTORY: From doctor Khan's of original intake node 05/31/2011:  "Carolyn [name] is a 46 y.o. female. Without significant past medical history. She underwent a mammogram that showed calcifications measuring 5 cm on the right breast. She then went on To have an ultrasound which showed the area to measure 2.9 cm. She was also found to have a right axillary lymph node that was suspicious for a malignancy. She had a biopsy of the calcifications that showed high-grade ductal carcinoma in situ. 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 In the lymph node biopsy there was no lymphatic tissue seen and was felt that the node was replaced by tumor. Patient went on to have an MRI of the bilateral breasts performed on evening of 05/30/2011. The MRI showed in the right breast 5 x 3 x 4.5 cm mass abutting the chest wall. About 7 cm away from this there was another mass in the upper inner quadrant that measured 1.5 x 1.7 x 1.5 cm. On the contralateral breast, that is the left breast a 2 cm area suspicious enhancement was noted also this up to date has not been biopsied and arrangements are being made for the biopsy to be performed. In this side there were no suspicious lymph nodes. The prognostic  panel is pending. Patient is otherwise without any complaints. "  METASTATIC DISEASE: From the earlier summary note:  Carolyn [name] noted some strange feelings around her umbilicus 03/88/8280. This felt like an area of numbness. Over the next 2 days she noted some leg weakness and difficulty walking,  so she presented to the ED 04/12/2014. MRI of the thoraco-lumbar spine was obtained 04/12/2014 showing multiple bone lesions and compression fracture at T8 with retropulsion and cord compression. On 04/13/2014 she underwent Laminectomy T8 decompression of spinal cord posterior fixation from T6-T10 with pedicle screws and rods posterior arthrodesis with allograft. The pathology from this procedure ([id number] [id number]) showed metastatic adenocarcinoma which was estrogen receptor 69% positive, with moderate staining intensity, progesterone receptor negative. HER-2 could not be obtained.  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 There were nonspecific L temporal bone changes and also possible involvement of the clivus and calvarium, but no other parenchymal brain lesions. Further staging studies included a bone scan, which failed to show the lytic lesion seen on other scans, and CTs of the chest, abdomen and pelvis on 06/08/2011, which showed very small right lung and left liver lesions which will require follow-up  Her subsequent history is as detailed below   PAST MEDICAL HISTORY: Past Medical History:  Diagnosis Date  . Breast cancer ([place]) 09/2011   invasive ductal carcinoma metastatic ca in 3/14 lymph nodes  . History of chemotherapy 06/27/11 -09/04/11   neoadjuvant  . Hx of radiation therapy 11/08/11 -12/26/11   right chest wall/supraclav fossa, right scar  . S/P radiation therapy 05/08/14   SRS brain    PAST SURGICAL HISTORY: Past Surgical History:  Procedure Laterality Date  . brain surgery-LITT Left 11/20/14   LITT procedure for Lt brain met.  Marland Kitchen BREAST BIOPSY     Left  . CHOLECYSTECTOMY N/A 03/01/2014   Procedure: LAPAROSCOPIC CHOLECYSTECTOMY;  Surgeon: Leighton Ruff, MD;  Location: [facility];  Service: General;  Laterality: N/A;  . LAMINECTOMY N/A 04/13/2014   Procedure: THORACIC LAMINECTOMY  WITH FIXATION THORACIC SIX-THORACIC TEN FUSION;  Surgeon: Kristeen Miss, MD;  Location: [facility];  Service: Neurosurgery;  Laterality: N/A;  . MODIFIED MASTECTOMY  10/03/2011   Procedure: MODIFIED MASTECTOMY;  Surgeon: Haywood Lasso, MD;  Location: [facility];  Service: General;  Laterality: Right;  . PORT-A-CATH REMOVAL  01/30/2012   Procedure: MINOR REMOVAL PORT-A-CATH;  Surgeon: Haywood Lasso, MD;  Location: [facility];  Service: General;  Laterality: Left;  . PORTACATH PLACEMENT  06/20/2011   Procedure: INSERTION PORT-A-CATH;  Surgeon: Haywood Lasso, MD;  Location: [facility];  Service: General;  Laterality: N/A;  . ROBOTIC ASSISTED TOTAL HYSTERECTOMY WITH BILATERAL SALPINGO OOPHERECTOMY Bilateral 12/23/2012   Procedure: ROBOTIC ASSISTED TOTAL HYSTERECTOMY WITH BILATERAL SALPINGO OOPHORECTOMY;  Surgeon: Marvene Staff, MD;  Location: [facility];  Service: Gynecology;  Laterality: Bilateral;  . WISDOM TOOTH EXTRACTION      FAMILY HISTORY Family History  Problem Relation Age of Onset  . Breast cancer Maternal Aunt 5  . Pancreatic cancer Maternal Grandfather   . Pancreatic cancer Maternal Aunt        died in her 34s  . Leukemia Maternal Aunt        died in her 30s  . Ovarian cancer Cousin 56       maternal cousin   the patient's father is alive, currently  56 years old. The patient's mother died from complications of diabetes at the age of 31. The patient had no brothers, 4 sisters. One sister has died from congestive heart failure. The patient's mother is one of 5 sisters. One of them was diagnosed with breast cancer the age of 41. Also a cousin on the maternal side it was diagnosed with ovarian cancer. This was approximately age 63. There is also colon cancer in the family. The patient has had extensive genetic testing summarized below. She is however BRCA negative  GYNECOLOGIC HISTORY:  Patient's last menstrual period was 07/05/2011. Menarche  age 68, she is GX P0. She underwent total hysterectomy with bilateral salpingo-oophorectomy December 2014.  SOCIAL HISTORY:  Marytza works at [employer] for [employer]. She mostly deals with government documents. She is single, lives by herself, with no pets. She attends [employer] locally.    ADVANCED DIRECTIVES: Not in place. The patient has a documents and intends to name her sister Marieanne Marxen as healthcare power of attorney. Joelene Millin lives in [place] and can be reached at [place]: Social History  Substance Use Topics  . Smoking status: Never Smoker  . Smokeless tobacco: Never Used  . Alcohol use No     Colonoscopy:  PAP:  Bone density:  Lipid panel:  Allergies  Allergen Reactions  . Allegra [Fexofenadine] Hives    Abdomen only  . Cat Hair Extract Other (See Comments)  . Shellfish Allergy Hives    Abdomen only  . Glucosamine Rash    Current Outpatient Prescriptions  Medication Sig Dispense Refill  . Calcium Carbonate-Vit D-Min (CALCIUM 1200 PO) Take 1 tablet by mouth 2 (two) times daily.    . Cholecalciferol (VITAMIN D3) 2000 UNITS TABS Take 2,000 Units by mouth daily.    Marland Kitchen everolimus (AFINITOR) 10 MG tablet Take 1 tablet (10 mg total) by mouth daily. 30 tablet 6  . exemestane (AROMASIN) 25 MG tablet Take 1 tablet (25 mg total) by mouth daily after breakfast. 30 tablet 0  . methadone (DOLOPHINE) 5 MG tablet Take 0.5 tablets (2.5 mg total) by mouth every 8 (eight) hours. 30 tablet 0  . Multiple Vitamin (MULTIVITAMIN) tablet Take 1 tablet by mouth daily.    . ondansetron (ZOFRAN) 8 MG tablet Take 1 tablet (8 mg total) by mouth every 8 (eight) hours as needed for nausea or vomiting. 30 tablet 1  . polyethylene glycol (MIRALAX / GLYCOLAX) packet Take 17 g by mouth daily. 14 each 0  . traMADol (ULTRAM) 50 MG tablet Take 1 tablet (50 mg total) by mouth every 6 (six) hours as needed. 60 tablet 3   No current facility-administered medications for this  visit.     OBJECTIVE: Middle-aged [place] American woman using a walker  Vitals:   09/25/16 1107  BP: (!) 159/70  Pulse: (!) 116  Resp: 17  Temp: (!) 97.5 F (36.4 C)  SpO2: 95%     Body mass index is 19.66 kg/m.    ECOG FS:1 - Symptomatic but completely ambulatory   Sclerae unicteric, EOMs intact Oropharynx clear and moist No cervical or supraclavicular adenopathy Lungs no rales or rhonchi Heart regular rate and rhythm Abd soft, nontender, positive bowel sounds MSK no focal spinal tenderness, no upper extremity lymphedema Neuro: nonfocal, well oriented, appropriate affect Breasts: The right breast has undergone mastectomy with no evidence of chest wall recurrence. The left breast is unremarkable. Both axillae are benign.  LAB RESULTS:  CMP  Component Value Date/Time   NA 143 09/13/2016 1043   NA 143 08/28/2016 0949   K 3.5 09/13/2016 1043   K 3.7 08/28/2016 0949   CL 105 09/13/2016 1043   CL 102 01/16/2012 0804   CO2 31 09/13/2016 1043   CO2 32 (H) 08/28/2016 0949   GLUCOSE 124 (H) 09/13/2016 1043   GLUCOSE 113 08/28/2016 0949   GLUCOSE 92 01/16/2012 0804   BUN 15 09/13/2016 1043   BUN 33.1 (H) 08/28/2016 0949   CREATININE 1.78 (H) 09/13/2016 1043   CREATININE 2.0 (H) 08/28/2016 0949   CALCIUM 9.6 09/14/2016 1428   CALCIUM 12.9 (H) 08/28/2016 0949   PROT 6.3 (L) 09/10/2016 0425   PROT 7.3 08/28/2016 0949   ALBUMIN 3.2 (L) 09/10/2016 0425   ALBUMIN 3.7 08/28/2016 0949   AST 104 (H) 09/10/2016 0425   AST 35 (H) 08/28/2016 0949   ALT 74 (H) 09/10/2016 0425   ALT 13 08/28/2016 0949   ALKPHOS 126 09/10/2016 0425   ALKPHOS 117 08/28/2016 0949   BILITOT 0.5 09/10/2016 0425   BILITOT 0.29 08/28/2016 0949   GFRNONAA 33 (L) 09/13/2016 1043   GFRAA 38 (L) 09/13/2016 1043    I No results found for: SPEP  Lab Results  Component Value Date   WBC 6.4 09/25/2016   NEUTROABS 5.0 09/25/2016   HGB 8.6 (L) 09/25/2016   HCT 25.4 (L) 09/25/2016   MCV 86.7  09/25/2016   PLT 147 09/25/2016      Chemistry      Component Value Date/Time   NA 143 09/13/2016 1043   NA 143 08/28/2016 0949   K 3.5 09/13/2016 1043   K 3.7 08/28/2016 0949   CL 105 09/13/2016 1043   CL 102 01/16/2012 0804   CO2 31 09/13/2016 1043   CO2 32 (H) 08/28/2016 0949   BUN 15 09/13/2016 1043   BUN 33.1 (H) 08/28/2016 0949   CREATININE 1.78 (H) 09/13/2016 1043   CREATININE 2.0 (H) 08/28/2016 0949      Component Value Date/Time   CALCIUM 9.6 09/14/2016 1428   CALCIUM 12.9 (H) 08/28/2016 0949   ALKPHOS 126 09/10/2016 0425   ALKPHOS 117 08/28/2016 0949   AST 104 (H) 09/10/2016 0425   AST 35 (H) 08/28/2016 0949   ALT 74 (H) 09/10/2016 0425   ALT 13 08/28/2016 0949   BILITOT 0.5 09/10/2016 0425   BILITOT 0.29 08/28/2016 0949       Lab Results  Component Value Date   LABCA2 24 05/31/2011    No components found for: [id number]  No results for input(s): INR in the last 168 hours.  Urinalysis    Component Value Date/Time   COLORURINE STRAW (A) 09/05/2016 2000    STUDIES: Dg Chest 2 View  Result Date: 09/05/2016 CLINICAL DATA:  Lumbar pain radiating to the legs. EXAM: CHEST  2 VIEW COMPARISON:  June 17, 2014 FINDINGS: There is fullness of the left suprahilar region. The heart size normal. There is no focal infiltrate, pulmonary edema, or pleural effusion P Spinal rods are noted unchanged. Prior cholecystectomy clips are noted. IMPRESSION: Fullness of left suprahilar region. Further evaluation with a chest CT on outpatient basis is recommended to exclude mass. Electronically Signed   By: Abelardo Diesel M.D.   On: 09/05/2016 19:48   Mr Thoracic Spine W Wo Contrast  Result Date: 09/05/2016 CLINICAL DATA:  46 y/o  F; 329, image 5 EXAM: MRI THORACIC AND LUMBAR SPINE WITHOUT AND WITH CONTRAST TECHNIQUE: Multiplanar and multiecho pulse  sequences of the thoracic and lumbar spine were obtained without and with intravenous contrast. CONTRAST:  22mL MULTIHANCE GADOBENATE  DIMEGLUMINE 529 MG/ML IV SOLN COMPARISON:  04/06/2016, 01/14/2016, 05/03/2015 bone scan. 04/12/2014 lumbar and thoracic spine MRI FINDINGS: MRI THORACIC SPINE FINDINGS Alignment:  Physiologic. Vertebrae: T6-T10 posterior instrumented fusion and T8 laminectomy. Susceptibility artifact from the fusion hardware partially obscures the vertebral bodies at those levels. Stable moderate compression deformity of the T8 vertebral body. Vertebral body heights are otherwise well-preserved. There is heterogeneous bone marrow signal within the T7, T8, T9, and T12 vertebral bodies corresponding to prior enhancing lesions without definite enhancement on the current study, probably representing treated metastasis. Mild enhancement within the posterior T11 and posterior T12 vertebral bodies consistent with residual metastasis. Additionally, there is enhancement within the T8 vertebral body greater on the right and extending into the pedicle compatible with residual metastasis. Cord: No abnormal cord signal or enhancement. The spinal cord compression. No epidural disease identified. Paraspinal and other soft tissues: Subcentimeter pulmonary nodules in the right lung base (series 9, image 34) and indeterminate T2 hyperintense lesions in the right lobe of liver (series 9, image 47). Disc levels: No significant disc displacement or canal stenosis. MRI LUMBAR SPINE FINDINGS Segmentation:  Standard. Alignment:  Physiologic. Vertebrae: The diffusely heterogeneous lumbar spine and pelvis bone marrow signal with enhancement compatible with osseous metastatic disease. Metastatic disease is most pronounced at the T5 vertebral body with a for epidural disease effaces the anterior thecal space and enters the bilateral neural foramen greater on the left. No loss of vertebral body height. Conus medullaris: Extends to the L1-2 level and appears normal. No abnormal enhancement. Paraspinal and other soft tissues: Negative. Disc levels: No significant  disc displacement. Mild lower lumbar facet hypertrophy. Anterior epidural disease arising from the L5 vertebral body mildly narrows the spinal canal, effaces the lateral recesses with contact upon descending bilateral S1 nerve roots, moderately narrows the left and mildly narrows the right neural foramen. IMPRESSION: MRI thoracic spine: 1. Diffuse osseous metastasis with residual enhancing disease visualized at the T8, T11, and T12 vertebral body levels. No epidural disease. 2. Stable moderate compression deformity of T8 vertebral body. No new loss of vertebral body height. 3. Subcentimeter pulmonary nodules and indeterminate liver lesions may represent metastasis. MRI lumbar spine: 1. Diffuse osseous metastasis of lumbar spine and visible pelvis. 2. No loss of vertebral body height. 3. Anterior epidural disease at T5 results in moderate canal stenosis, lateral recess effacement with contact upon descending S1 nerve roots, and moderate left/mild right neural foraminal stenosis. Electronically Signed   By: Mitzi Hansen M.D.   On: 09/05/2016 19:42   Mr Lumbar Spine W Wo Contrast  Result Date: 09/05/2016 CLINICAL DATA:  46 y/o  F; 329, image 5 EXAM: MRI THORACIC AND LUMBAR SPINE WITHOUT AND WITH CONTRAST TECHNIQUE: Multiplanar and multiecho pulse sequences of the thoracic and lumbar spine were obtained without and with intravenous contrast. CONTRAST:  56mL MULTIHANCE GADOBENATE DIMEGLUMINE 529 MG/ML IV SOLN COMPARISON:  04/06/2016, 01/14/2016, 05/03/2015 bone scan. 04/12/2014 lumbar and thoracic spine MRI FINDINGS: MRI THORACIC SPINE FINDINGS Alignment:  Physiologic. Vertebrae: T6-T10 posterior instrumented fusion and T8 laminectomy. Susceptibility artifact from the fusion hardware partially obscures the vertebral bodies at those levels. Stable moderate compression deformity of the T8 vertebral body. Vertebral body heights are otherwise well-preserved. There is heterogeneous bone marrow signal within  the T7, T8, T9, and T12 vertebral bodies corresponding to prior enhancing lesions without definite enhancement on the current study,  probably representing treated metastasis. Mild enhancement within the posterior T11 and posterior T12 vertebral bodies consistent with residual metastasis. Additionally, there is enhancement within the T8 vertebral body greater on the right and extending into the pedicle compatible with residual metastasis. Cord: No abnormal cord signal or enhancement. The spinal cord compression. No epidural disease identified. Paraspinal and other soft tissues: Subcentimeter pulmonary nodules in the right lung base (series 9, image 34) and indeterminate T2 hyperintense lesions in the right lobe of liver (series 9, image 47). Disc levels: No significant disc displacement or canal stenosis. MRI LUMBAR SPINE FINDINGS Segmentation:  Standard. Alignment:  Physiologic. Vertebrae: The diffusely heterogeneous lumbar spine and pelvis bone marrow signal with enhancement compatible with osseous metastatic disease. Metastatic disease is most pronounced at the T5 vertebral body with a for epidural disease effaces the anterior thecal space and enters the bilateral neural foramen greater on the left. No loss of vertebral body height. Conus medullaris: Extends to the L1-2 level and appears normal. No abnormal enhancement. Paraspinal and other soft tissues: Negative. Disc levels: No significant disc displacement. Mild lower lumbar facet hypertrophy. Anterior epidural disease arising from the L5 vertebral body mildly narrows the spinal canal, effaces the lateral recesses with contact upon descending bilateral S1 nerve roots, moderately narrows the left and mildly narrows the right neural foramen. IMPRESSION: MRI thoracic spine: 1. Diffuse osseous metastasis with residual enhancing disease visualized at the T8, T11, and T12 vertebral body levels. No epidural disease. 2. Stable moderate compression deformity of T8  vertebral body. No new loss of vertebral body height. 3. Subcentimeter pulmonary nodules and indeterminate liver lesions may represent metastasis. MRI lumbar spine: 1. Diffuse osseous metastasis of lumbar spine and visible pelvis. 2. No loss of vertebral body height. 3. Anterior epidural disease at T5 results in moderate canal stenosis, lateral recess effacement with contact upon descending S1 nerve roots, and moderate left/mild right neural foraminal stenosis. Electronically Signed   By: Kristine Garbe M.D.   On: 09/05/2016 19:42   US Renal  Result Date: 09/06/2016 CLINICAL DATA:  Left-sided flank pain, acute renal failure EXAM: RENAL / URINARY TRACT ULTRASOUND COMPLETE COMPARISON:  12/16/2014 FINDINGS: Right Kidney: Length: 11.2 cm. Echogenicity within normal limits. No mass or hydronephrosis visualized. Left Kidney: Length: 11.6 cm. Echogenicity within normal limits. No mass or hydronephrosis visualized. Bladder: Appears normal for degree of bladder distention. Note is made of a small left-sided pleural effusion. IMPRESSION: No acute renal abnormality is noted. Small left pleural effusion is noted. Electronically Signed   By: Inez Catalina M.D.   On: 09/06/2016 07:37   Ct Hip Left Wo Contrast  Result Date: 09/06/2016 CLINICAL DATA:  Left hip pain.  History of metastatic breast cancer. EXAM: CT OF THE LEFT HIP WITHOUT CONTRAST TECHNIQUE: Multidetector CT imaging of the left hip was performed according to the standard protocol. Multiplanar CT image reconstructions were also generated. COMPARISON:  Bone scan dated April 06, 2016. CT abdomen pelvis dated December 16, 2014. FINDINGS: Bones/Joint/Cartilage Several pericentimeter mixed lytic and sclerotic lesions are seen in the left hemipelvis: A 13 mm lesion at the left puboacetabular junction, a 13 mm lesion in the posterior ischium, and a 13 mm lesion in the parasymphyseal left pubic bone. Partially visualized 12 mm predominately lytic lesion in the  left sacral ala. Additional small subcentimeter lytic lesions are seen in the posterior ischium (series 2, image 21), left iliac wing (series 4, image 16), and the left acetabulum (series 4, image 26). These lesions correspond  to areas of increased uptake on the prior bone scan, but were not present on prior CT from December 2016. No lesion is seen in the left proximal femur. No acute fracture or malalignment. The left hip joint space is relatively preserved. Ligaments Suboptimally assessed by CT. Muscles and Tendons No focal abnormality. Soft tissues Unremarkable. 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 No fracture. Electronically Signed   By: Titus Dubin M.D.   On: 09/06/2016 15:09   Nm Pulmonary Vent And Perf (v/q Scan)  Result Date: 09/05/2016 CLINICAL DATA:  Lumbar pain, positive D-dimer EXAM: NUCLEAR MEDICINE VENTILATION - PERFUSION LUNG SCAN TECHNIQUE: Ventilation images were obtained in multiple projections using inhaled aerosol [id number]. Perfusion images were obtained in multiple projections after intravenous injection of Tc-958mAA. RADIOPHARMACEUTICALS:  32 mCi Technetium-996mPA aerosol inhalation and 3.94 mCi Technetium-41m51m IV COMPARISON:  Chest x-ray 09/05/2016 FINDINGS: Ventilation: Heterogenous ventilation within the upper lobes. Central airways clumping of activity. Perfusion: Heterogenous perfusion. Multiple small relatively peripheral filling defects within the upper lobes similar in distribution to heterogeneous ventilation. IMPRESSION: Heterogenous profusion with multiple small matched defects in the upper lobes. Overall low probability for pulmonary embolus. Electronically Signed   By: Kim Donavan Foil.   On: 09/05/2016 22:03    ASSESSMENT: 46 y42. 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   (a) VUS [place] also [place] BARD[place]  (2) additional right breast biopsy upper inner quadrant 06/08/2011 showed only a fibroadenoma, and central left breast biopsy for another suspicious lesion showed only fibrocystic changes (SAA [phone] [id number]).   (3)Treated neoadjuvantly with cyclophosphamide and docetaxel x4 completed 08/29/2011.  (4) status post right modified radical mastectomy 10/03/2011 showing a residual pT1c pN1a (3/18 lymph nodes positive) invasive ductal carcinoma, grade 1,estrogen receptor 100% positive, progesterone receptor negative, with no HER-2 amplification ([id number])  (5) adjuvant radiation to the right chest wall, right supraclavicular fossa and right scar completed 12/26/2011  (6) tamoxifen started January 2014-- discontinued April 2016 with evidence of metastatic disease  (7) status post total hysterectomy with bilateral salpingo-oophorectomy 12/23/2012 with benign pathology (SZD [id number] DISEASE 04/13/2014 (8) presented with T8 cord compression and underwent laminectomy 04/13/2014 with T8 decompression of spinal cord, posterior fixation from T6-T10 with pedicle screws and rods, posterior arthrodesis with allograft. 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 ExacTrac Snap verification was performed for each couch angle.   (b) radiation to the T-spine completed 05/08/2014.  (c) [facility]  (d) SRS to 0.4 cm left parietal lesion 12/23/2015  (11) started fulvestrant 05/18/2014 and Palbociclib 06/01/2014  (a) Palbociclib dose decreased to 100 mg daily, 21/7 as of 08/05/2014  (  b) palbociclib dose decreased to 75 mg daily, 21/7, starting May 2017  (c) palbociclib dose decreased to 75 mg every other day beginning 07/26/2015    (d) palbociclib held for month of October because of persistent low counts, resumed November  (e) fulvestrant discontinued after 11/02/2016 dose: Anastrozole started 08/01/2016  (e)  (12) started zolendronate 05/18/2014, repeated every 12 weeks  (a) July zolendronate dose held because of dental issues, resumed 10/18/2015  (b) switched to denosumab as of 01/20/2016 with concern of bony progression on zolendronate  (c) switching back to zolendronate as of April 2018 at patient's request  (d) switched to denosumab/Xgeva because of hypercalcemia, first dose 09/13/2016  (13) most recent staging studies:  (a) brain MRI 07/13/2016 shows stable to smaller treated lesions.  (b) bone scan 04/06/2016 shows no new lesions  (c) chest CT scan 04/06/2016 shows small bilateral pulmonary nodules with no evidence of disease progression  (d) MRI of the thoracolumbar spine shows diffuse bony metastases particularly involving T8, T11 and T12, but without epidural disease. There were subcentimeter pulmonary nodules and indeterminate liver lesions. CT scan of the left hip 09/06/2016 showed multiple bony metastases but no fracture  (14) exemestane started 09/05/2016  (a) everolimus order entered 09/25/2016  (15) palliative radiation to the right hip to be completed 09/26/2016    PLAN: Gabby's stage IV breast cancer has progressed and we have taken her  off the fulvestrant and palbociclib. She was started on exemestane about 2 weeks ago. She is having significant nighttime hot flashes from that. I just put the order for everolimus today. Hopefully she can start that within the next week.  We did discuss the possible toxicities, side effects and complications of these agents. She is aware of the possibility of mouth sores and pneumonitis from everolimus. Certainly if she develops a persistent dry cough she will need to let us know.  As for the nighttime hot flashes she is going to go back on gabapentin 300 mg at bedtime, which I think will be helpful in terms of pain as well as insomnia as well as the hot flashes.  She will receive her next denosumab/Xgeva dose 10/11/2016. Hopefully this will control the calcium. I have asked her to stop calcium supplementations and vitamin D supplementation for now.  I think the nausea and a little bit of wooziness that she has been feeling may well be due to the methadone, even though the dose is very low. I asked her to stop the methadone, take 1 or 2 tramadol up to 4 times a day and take 1 or 2 Aleve in addition up to 3 times a day with food. I asked her to call us back on Wednesday and let us know if this change to care of the nausea problem. If it does not we will probably go back on the methadone which is certainly a simpler regimen  She is scheduled for brain MRI 10/19/2016. I am going to see her the next day to review that but also to make sure her current treatment is on track. She understands that if the exemestane and everolimus does not work we will need to move to chemotherapy.  Finally she is looking forward to getting back to work at some point in the future. I am going to refer her to physical therapy hopefully to start next week to see if we can operationalize that    Wilmer Santillo, Valentino Hue, MD  09/25/16 11:12 AM Medical Oncology and Hematology [facility]  [place], [place] [address] Tel. [phone]    Fax. [phone]  This document serves as a record of services personally performed by Lurline Del, MD. It was created on her behalf by Steva Colder, a trained medical scribe. The creation of this record is based on the scribe's personal observations and the provider's statements to them. This document has been checked and approved by the attending provider.

## 2016-09-25 NOTE — Telephone Encounter (Signed)
Oral Oncology Patient Advocate Encounter  Received notification from [facility] that prior authorization for Afinitor is required.  PA submitted on CoverMyMeds Key [id number] Status is pending  Oral Oncology Clinic will continue to follow.  Fabio Asa. Melynda Keller, [place] Patient [place] [phone] 09/25/2016 12:11 PM

## 2016-09-26 ENCOUNTER — Encounter: Payer: Self-pay | Admitting: Radiation Oncology

## 2016-09-26 ENCOUNTER — Ambulatory Visit: Payer: BC Managed Care – PPO | Admitting: Physical Therapy

## 2016-09-26 ENCOUNTER — Telehealth: Payer: Self-pay | Admitting: Pharmacist

## 2016-09-26 ENCOUNTER — Ambulatory Visit
Admission: RE | Admit: 2016-09-26 | Discharge: 2016-09-26 | Disposition: A | Discharge status: Home / Self Care | Payer: BC Managed Care – PPO | Source: Ambulatory Visit | Attending: Radiation Oncology | Admitting: Radiation Oncology

## 2016-09-26 DIAGNOSIS — C7951 Secondary malignant neoplasm of bone: Secondary | ICD-10-CM | POA: Diagnosis not present

## 2016-09-26 DIAGNOSIS — Z170 Estrogen receptor positive status [ER+]: Principal | ICD-10-CM

## 2016-09-26 DIAGNOSIS — C50411 Malignant neoplasm of upper-outer quadrant of right female breast: Secondary | ICD-10-CM

## 2016-09-26 MED ORDER — DEXAMETHASONE 0.5 MG/5ML PO SOLN: ORAL | 3 refills | Status: DC

## 2016-09-26 MED FILL — DEXAMETHASONE 0.5 MG/5 ML L: 0.5 | 12 days supply | Qty: 500 | Fill #0

## 2016-09-26 MED FILL — AFINITOR 10 MG TABLET: 10 | 28 days supply | Qty: 28 | Fill #0

## 2016-09-26 NOTE — Telephone Encounter (Signed)
Oral Oncology Pharmacist Encounter  Received new prescription for Afinitor for the treatment of metastatic, hormone-receptor positive breast cancer in conjunction with exemestane, planned duration until disease progression or unacceptable toxicity.  Labs from 09/25/16 assessed, OK for treatment.  Current medication list in Epic reviewed, minor DDI with Afinitor identified:  Afinitor and methadone: Category B interaction: Afinitor is a weak CYP3A4 inhibitor and may increase systemic exposure to methadone due to a potential decrease in its metabolism. No change to current therapy indicated at this time. Patient will be counseled to be aware of increased somnolence and nausea.  Prescription has been e-scribed to the [facility] for benefits analysis and approval. Copayment $250, copay card reduces patient's out of pocket expense to $0  Patient would like her prescription to be ready for pick-up tomorrow (09/27/16). This will be coordinated with the pharmacy. Patient will be called when ready for pick-up.   I spoke with patient for overview of new oral chemotherapy medication: Afinitor for the treatment of metastatic, hormone-receptor positive breast cancer in conjunction with exemestane, planned duration until disease progression or unacceptable toxicity.   Counseled patient on administration, dosing, side effects, safe handling, and monitoring.  Patient will take Afinitor 10mg  tablets, 1 tablet by mouth once daily without regard to food. Patient understands to take Afinitor consistently with regards to food and at approximately the same time each day. Patient knows to aviod grapefruit or grapefruit juice while on therapy with Afinitor.  Patient will take exemestane 25mg  tablets, 1 tablet by mouth once daily after a meal. Patient plans to take her Afinitor and exemestane after breakfast daily.  Side effects include but not limited to: mouth sores, GI upset, rash, increased  blood sugars, decreased blood counts, and edema.   Prescription for dexamethasone mouthwash e-scribed to pharmacy to fill with patient's Afinitor.  Patient states she has discontinued use of methadone. This has been removed from patient's medication list.  Reviewed with patient importance of keeping a medication schedule and plan for any missed doses.  Ms. Cuen voiced understanding and appreciation.   All questions answered.  Will follow up with patient regarding insurance and pharmacy.   Patient knows to call the office with questions or concerns. Oral Oncology Clinic will continue to follow.  Thank you,  Johny Drilling, PharmD, BCPS, BCOP 09/26/2016  4:49 PM Oral Oncology Clinic [phone]

## 2016-09-26 NOTE — Telephone Encounter (Signed)
Oral Oncology Patient Advocate Encounter  Prior Authorization for Afinitor has been approved.    PA# [id number] Effective dates: 09-25-16 through 09-25-17  A test claim returns a $250 monthly copayment.  This expense should be reduced to $0 with a copay card.    Oral Oncology Clinic will continue to follow.   Fabio Asa. Melynda Keller, [place] Patient [place] [phone] 09/26/2016 9:27 AM

## 2016-09-27 ENCOUNTER — Ambulatory Visit: Payer: BC Managed Care – PPO | Attending: Oncology | Admitting: Physical Therapy

## 2016-09-27 VITALS — HR 105

## 2016-09-27 DIAGNOSIS — R262 Difficulty in walking, not elsewhere classified: Secondary | ICD-10-CM | POA: Diagnosis present

## 2016-09-27 DIAGNOSIS — M6281 Muscle weakness (generalized): Secondary | ICD-10-CM | POA: Diagnosis not present

## 2016-09-27 DIAGNOSIS — R2681 Unsteadiness on feet: Secondary | ICD-10-CM

## 2016-09-27 NOTE — Therapy (Signed)
[place], [place], [address] Phone: [phone]   Fax:  [phone]  Physical Therapy Evaluation  Patient Details  Name: Carolyn [name] MRN: [id number] Date of Birth: 1970-03-09 Referring Provider: Dr. Gunnar Bulla Magrinat  Encounter Date: 09/27/2016      PT End of Session - 09/27/16 2016    Visit Number 1   Number of Visits 17   Date for PT Re-Evaluation 12/08/16   PT Start Time 1455   PT Stop Time 1545   PT Time Calculation (min) 50 min   Activity Tolerance Patient limited by lethargy   Behavior During Therapy [facility] for tasks assessed/performed      Past Medical History:  Diagnosis Date  . 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 History of chemotherapy 06/27/11 -09/04/11   neoadjuvant  . Hx of radiation therapy 11/08/11 -12/26/11   right chest wall/supraclav fossa, right scar  . S/P radiation therapy 05/08/14   SRS brain    Past Surgical History:  Procedure Laterality Date  . brain surgery-LITT Left 11/20/14   LITT procedure for Lt brain met.  Marland Kitchen BREAST BIOPSY     Left  . CHOLECYSTECTOMY N/A 03/01/2014   Procedure: LAPAROSCOPIC CHOLECYSTECTOMY;  Surgeon: Leighton Ruff, MD;  Location: [facility];  Service: General;  Laterality: N/A;  . LAMINECTOMY N/A 04/13/2014   Procedure: THORACIC LAMINECTOMY WITH FIXATION THORACIC SIX-THORACIC TEN FUSION;  Surgeon: Kristeen Miss, MD;  Location: [place] [facility];  Service: Neurosurgery;  Laterality: N/A;  . MODIFIED MASTECTOMY  10/03/2011   Procedure: MODIFIED MASTECTOMY;  Surgeon: Haywood Lasso, MD;  Location: [facility];  Service: General;  Laterality: Right;  . PORT-A-CATH REMOVAL  01/30/2012   Procedure: MINOR REMOVAL PORT-A-CATH;  Surgeon: Haywood Lasso, MD;  Location: [place];  Service: General;  Laterality: Left;  . PORTACATH PLACEMENT  06/20/2011   Procedure: INSERTION  PORT-A-CATH;  Surgeon: Haywood Lasso, MD;  Location: [place];  Service: General;  Laterality: N/A;  . ROBOTIC ASSISTED TOTAL HYSTERECTOMY WITH BILATERAL SALPINGO OOPHERECTOMY Bilateral 12/23/2012   Procedure: ROBOTIC ASSISTED TOTAL HYSTERECTOMY WITH BILATERAL SALPINGO OOPHORECTOMY;  Surgeon: Marvene Staff, MD;  Location: [facility];  Service: Gynecology;  Laterality: Bilateral;  . WISDOM TOOTH EXTRACTION      Vitals:   09/27/16 1533  Pulse: (!) 105  SpO2: 92%         Subjective Assessment - 09/27/16 1457    Subjective "I've been in the hospital because my left side (leg to lower back) had calcium deposits and it went into my hips.  The calcium levels were over, and they got it downl But it got my sciatic too. Had radiation treatments for two weeks to both hips and lumbar back. Not having any pain right now but I'm not able to walk right now like I normally do.  Having problems more on the right side. Was walking with a cane but now needs to use a walker. My balance is bad.   Pertinent History BRCA negative [place] woman status post right breast upper outer quadrant and right axillary lymph node biopsy 05/18/2011, both positive for an invasive ductal carcinoma, high-grade, clinicallyT2 N1-2 or stage IIB/IIIA,. status post right modified radical mastectomy 10/03/2011. presented with T8 cord compression and underwent laminectomy 04/13/2014 with T8 decompression of spinal cord, posterior fixation from T6-T10 with pedicle screws and rods. Had LITT brain surgery at [facility] in  2016 to remove a tumor.Has had XRT to brain and thoracic spine. MRI of the thoracolumbar spine shows diffuse bony metastases particularly involving T8, T11 and T12, but without epidural disease. There were subcentimeter pulmonary nodules and indeterminate liver lesions. CT scan of the left hip 09/06/2016 showed multiple bony metastases but no fracture.  Recently had 2 weeks of XRT to left hip. (Most of this history from Dr.  Virgie Dad recent note.)   Patient Stated Goals to walk, try to get my balance; walk without the walker, but with a cane   Currently in Pain? No/denies            [facility] PT Assessment - 09/27/16 0001      Assessment   Medical Diagnosis metastatic breast cancer with multiple lesions to bone, brain, liver, and lungs   Referring Provider Dr. Gunnar Bulla Magrinat   Onset Date/Surgical Date --  2013   Hand Dominance Right   Prior Therapy none     Precautions   Precautions Fall;Other (comment)   Precaution Comments bony and brain mets     Restrictions   Weight Bearing Restrictions No     Balance Screen   Has the patient fallen in the past 6 months No   Has the patient had a decrease in activity level because of a fear of falling?  No  but is very careful   Is the patient reluctant to leave their home because of a fear of falling?  No     Home Environment   Living Environment Private residence   Living Arrangements Other relatives  sister   Type of [place] to enter  5   Entrance Stairs-Number of Steps 5   Entrance Stairs-Rails Right;Left   Additional Comments sister and nephew help with getting up steps; no steps inside     Prior Function   Level of Independence Needs assistance with homemaking   Vocation Full time employment  on leave currently   Vocation Requirements mostly deskwork and computer   Leisure tries to get out on the balcony and sit there. Her apt. complex has gym equipment available, and she will check what that is.     Cognition   Overall Cognitive Status Within Functional Limits for tasks assessed     Observation/Other Assessments   Observations right calf is atrophied compared to left     Functional Tests   Functional tests Sit to Stand     Sit to Stand   Comments unable to stand without using hands to push off     ROM / Strength   AROM / PROM / Strength AROM     AROM   Overall AROM Comments Both shoulders moderately  limited (to approx. 110 degrees) in flexion and abduction; rotation functional. LE A/ROM with slight limitations (bilat. hip flexion, right knee extension), but patient hesitates to move because it may hurt.      Ambulation/Gait   Ambulation/Gait Yes   Ambulation/Gait Assistance 6: Modified independent (Device/Increase time)   Assistive device Rolling walker   Gait Pattern Step-through pattern   Stairs Yes   Stairs Assistance 4: Min guard   Stair Management Technique Two rails;Alternating pattern  but placed foot down on each step tho alternating   Number of Stairs 4   Height of Stairs 8   Gait Comments gait is just slightly not smooth and appears weak.  She can walk without device with contact guard assist, but is more halting with that.  says she can't walk a block     6 minute walk test results    Endurance additional comments Walked 187 feet in approx. 1 min. 30 seconds x 2 with rest in between; also went up/down 4 steps with railings with rest. Had moderate SOB with first leg of walk, mild at end of second leg of walk.  SpO2 90, HR 119 after walking 187 feet on level.            Objective measurements completed on examination: See above findings.                     Short Term Clinic Goals - 09/27/16 2027      CC Short Term Goal  #1   Title Pt. will be independent with beginning home exercise program for general strengthening and conditioning.   Time 4   Period Weeks   Status New   Target Date 11/08/16     CC Short Term Goal  #2   Title Pt. will be able to walk at least 150 feet using a cane with close supervision.   Time 4   Period Weeks   Status New   Target Date 11/08/16             Long Term Clinic Goals - 09/27/16 2028      CC Long Term Goal  #1   Title Pt. will be able to walk 500 feet using a single point cane independently to improve her mobility for return to work.   Time 8   Period Weeks   Status New   Target Date 12/08/16      CC Long Term Goal  #2   Title Pt. will report at least 50% greater ease with ascending stairs to get into the apartment where she lives.   Time 8   Period Weeks   Status New   Target Date 12/08/16     CC Long Term Goal  #3   Title Pt. will know a safe program of exercise to do at home or at the gym at her apartment complex.   Time 8   Period Weeks   Status New             Plan - 09/27/16 2017    Clinical Impression Statement Patient is known to this clinic from treatment here a few years ago; she also has been treated at our [place] clinic.  She has metastatic breast cancer, with metastases in bone, brain, lung, and liver. She has recently completed two weeks of radiation to her hip area and now would like to improve her walking.  She had walked with a cane but now requires a walker.  Her endurance is also limited.    History and Personal Factors relevant to plan of care: not currently able to work due to her medical condition; also relies on her sister to help with ADLs   Clinical Presentation Evolving   Clinical Presentation due to: metastatic disease that has not been stable   Clinical Decision Making Moderate   Rehab Potential Good   Clinical Impairments Affecting Rehab Potential bony and other metastases   PT Frequency 2x / week   PT Duration 8 weeks   PT Treatment/Interventions ADLs/Self Care Home Management;Moist Heat;Cryotherapy;Gait training;Stair training;Functional mobility training;Therapeutic exercise;Balance training;Neuromuscular re-education;Patient/family education;Manual techniques   PT Next Visit Plan [place] or other balance testing; begin strengthening with functional activities as opposed to weight training due to bony mets; gait  and balance training   Consulted and Agree with Plan of Care Patient      Patient will benefit from skilled therapeutic intervention in order to improve the following deficits and impairments:  Abnormal gait, Decreased endurance,  Decreased balance, Decreased strength  Visit Diagnosis: Muscle weakness (generalized) - Plan: PT plan of care cert/re-cert  Unsteadiness on feet - Plan: PT plan of care cert/re-cert  Difficulty in walking, not elsewhere classified - Plan: PT plan of care cert/re-cert     Problem List Patient Active Problem List   Diagnosis Date Noted  . AKI (acute kidney injury) ([place])   . Chest pain   . Hypercalcemia 09/05/2016  . ARF (acute renal failure) ([place]) 09/05/2016  . Bone metastases ([place]) 01/17/2016  . Genetic testing 10/15/2014  . Hypokalemia 06/17/2014  . Breast cancer metastasized to brain ([place])   . Metastatic cancer to spine ([place]) 04/20/2014  . Malnutrition of moderate degree ([place]) 04/13/2014  . Myelopathy ([place]) 04/12/2014  . Pathologic fracture of thoracic vertebrae 04/12/2014  . Biliary colic 93/96/8864  . Malignant neoplasm of upper-outer quadrant of right breast in female, estrogen receptor positive ([place]) 09/24/2013  . S/P total hysterectomy and bilateral salpingo-oophorectomy 12/23/2012    [place],Carolyn [name] 09/27/2016, 8:34 PM  [place], [place], [address] Phone: [phone]   Fax:  [phone]  Name: Carolyn [name] MRN: [id number] Date of Birth: 01-Feb-1970  Serafina Royals, PT 09/27/16 8:35 PM

## 2016-10-02 ENCOUNTER — Other Ambulatory Visit: Payer: Self-pay | Admitting: Adult Health

## 2016-10-02 ENCOUNTER — Telehealth: Payer: Self-pay

## 2016-10-02 MED ORDER — GABAPENTIN 100 MG PO CAPS: 100.0000 mg | ORAL_CAPSULE | Freq: Every day | ORAL | 0 refills | Status: DC

## 2016-10-02 MED ORDER — ONDANSETRON HCL 8 MG PO TABS: 8.0000 mg | ORAL_TABLET | Freq: Three times a day (TID) | ORAL | 1 refills | Status: DC | PRN

## 2016-10-02 MED FILL — GABAPENTIN 100 MG CAPS: 100 | 30 days supply | Qty: 90 | Fill #0

## 2016-10-02 NOTE — Telephone Encounter (Signed)
Pt called with n/v.  She started afinitor on 9/22. She was nauseated before afinitor d/t radiation. She had been vomiting before afinitior as well. She is worse in evening time. She is using her zofran about 1 x/day. It does work when she takes it.  Instructed her to use zofran more often. She is able to get food and fluids in.  Her gabapentin is making her very dizzy in the morning. 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 She is asking if she can take a smaller dose.   Exemestane refill due next week.

## 2016-10-02 NOTE — Telephone Encounter (Signed)
S/w Mendel Ryder and adjusted her gabapentin.  lvm on pt phone with this information.

## 2016-10-03 ENCOUNTER — Encounter: Payer: Self-pay | Admitting: Oncology

## 2016-10-03 MED FILL — ONDANSETRON HCL 8 MG TAB: 8 | 10 days supply | Qty: 30 | Fill #0

## 2016-10-03 NOTE — Progress Notes (Signed)
Received PA request for Ondansetron from [place].  Called [facility]([place]) to initiate pa.Answered clinical questions.  She states she approved PA for 6 months and a fax would be received once she is done setting up overrides.

## 2016-10-03 NOTE — Progress Notes (Signed)
Have not received PA approval via fax for Ondansetron.  Called [facility]([place]) to advise of approval and asked if they received it. She said no but it is going through now.

## 2016-10-04 ENCOUNTER — Ambulatory Visit: Payer: BC Managed Care – PPO

## 2016-10-04 ENCOUNTER — Encounter: Payer: Self-pay | Admitting: Oncology

## 2016-10-04 DIAGNOSIS — M6281 Muscle weakness (generalized): Secondary | ICD-10-CM

## 2016-10-04 DIAGNOSIS — R262 Difficulty in walking, not elsewhere classified: Secondary | ICD-10-CM

## 2016-10-04 DIAGNOSIS — R2681 Unsteadiness on feet: Secondary | ICD-10-CM

## 2016-10-04 NOTE — Therapy (Signed)
[place], [place], [address] Phone: [phone]   Fax:  [phone]  Physical Therapy Treatment  Patient Details  Name: Carolyn [name] MRN: [id number] Date of Birth: [birth year] Referring Provider: Dr. Gunnar Bulla Magrinat  Encounter Date: 10/04/2016      PT End of Session - 10/04/16 1604    Visit Number 2   Number of Visits 17   Date for PT Re-Evaluation 12/08/16   PT Start Time 1522   PT Stop Time 1603   PT Time Calculation (min) 41 min   Activity Tolerance Patient limited by fatigue;Patient tolerated treatment well   Behavior During Therapy [facility] for tasks assessed/performed      Past Medical History:  Diagnosis Date  . 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 History of chemotherapy 06/27/11 -09/04/11   neoadjuvant  . Hx of radiation therapy 11/08/11 -12/26/11   right chest wall/supraclav fossa, right scar  . S/P radiation therapy 05/08/14   SRS brain    Past Surgical History:  Procedure Laterality Date  . brain surgery-LITT Left 11/20/14   LITT procedure for Lt brain met.  Marland Kitchen BREAST BIOPSY     Left  . CHOLECYSTECTOMY N/A 03/01/2014   Procedure: LAPAROSCOPIC CHOLECYSTECTOMY;  Surgeon: Leighton Ruff, MD;  [facility];  Service: General;  Laterality: N/A;  . LAMINECTOMY N/A 04/13/2014   Procedure: THORACIC LAMINECTOMY WITH FIXATION THORACIC SIX-THORACIC TEN FUSION;  Surgeon: Kristeen Miss, MD;  Location: [place] [facility];  Service: Neurosurgery;  Laterality: N/A;  . MODIFIED MASTECTOMY  10/03/2011   Procedure: MODIFIED MASTECTOMY;  Surgeon: Haywood Lasso, MD;  Location: [place];  Service: General;  Laterality: Right;  . PORT-A-CATH REMOVAL  01/30/2012   Procedure: MINOR REMOVAL PORT-A-CATH;  Surgeon: Haywood Lasso, MD;  Location: [place];  Service: General;  Laterality: Left;  . PORTACATH PLACEMENT  06/20/2011    Procedure: INSERTION PORT-A-CATH;  Surgeon: Haywood Lasso, MD;  Location: [place];  Service: General;  Laterality: N/A;  . ROBOTIC ASSISTED TOTAL HYSTERECTOMY WITH BILATERAL SALPINGO OOPHERECTOMY Bilateral 12/23/2012   Procedure: ROBOTIC ASSISTED TOTAL HYSTERECTOMY WITH BILATERAL SALPINGO OOPHORECTOMY;  Surgeon: Marvene Staff, MD;  Location: [place] [facility];  Service: Gynecology;  Laterality: Bilateral;  . WISDOM TOOTH EXTRACTION      There were no vitals filed for this visit.      Subjective Assessment - 10/04/16 1524    Subjective My Rt hip doesn't hurt anymore my leg just feels heavy.   Pertinent History BRCA negative [place] woman status post right breast upper outer quadrant and right axillary lymph node biopsy 05/18/2011, both positive for an invasive ductal carcinoma, high-grade, clinicallyT2 N1-2 or stage IIB/IIIA,. status post right modified radical mastectomy 10/03/2011. presented with T8 cord compression and underwent laminectomy 04/13/2014 with T8 decompression of spinal cord, posterior fixation from T6-T10 with pedicle screws and rods. Had LITT brain surgery at [facility] in 2016 to remove a tumor.Has had XRT to brain and thoracic spine. MRI of the thoracolumbar spine shows diffuse bony metastases particularly involving T8, T11 and T12, but without epidural disease. There were subcentimeter pulmonary nodules and indeterminate liver lesions. CT scan of the left hip 09/06/2016 showed multiple bony metastases but no fracture.  Recently had 2 weeks of XRT to left hip. (Most of this history from Dr. Virgie Dad recent note.)   Patient Stated Goals to walk, try to get my balance; walk  without the walker, but with a cane   Currently in Pain? No/denies            [facility] PT Assessment - 10/04/16 0001      Berg Balance Test   Sit to Stand Able to stand  independently using hands   Standing Unsupported Able to stand safely 2 minutes   Sitting with Back Unsupported but Feet Supported on  Floor or Stool Able to sit safely and securely 2 minutes   Stand to Sit Sits safely with minimal use of hands   Transfers Able to transfer safely, definite need of hands   Standing Unsupported with Eyes Closed Able to stand 10 seconds safely   Standing Ubsupported with Feet Together Able to place feet together independently and stand 1 minute safely   From Standing, Reach Forward with Outstretched Arm Can reach forward >12 cm safely (5")   From Standing Position, Pick up Object from Floor Unable to pick up and needs supervision   From Standing Position, Turn to Look Behind Over each Shoulder Looks behind one side only/other side shows less weight shift   Turn 360 Degrees Able to turn 360 degrees safely but slowly   Standing Unsupported, Alternately Place Feet on Step/Stool Able to stand independently and complete 8 steps >20 seconds   Standing Unsupported, One Foot in Front Able to plae foot ahead of the other independently and hold 30 seconds   Standing on One Leg Able to lift leg independently and hold equal to or more than 3 seconds  Rt SLS 2-3 sec, Lt 5 sec   Total Score 43                     [facility] Adult PT Treatment/Exercise - 10/04/16 0001      Knee/Hip Exercises: Aerobic   Recumbent Bike Level 1, x 3 minutes  RPE 7-8 after this     Knee/Hip Exercises: Standing   Heel Raises 10 reps   Hip Flexion Stengthening;Right;Left;5 reps  At back of bike for 1 HHA   Hip Abduction Stengthening;Right;Left;5 reps  At back of bike for +1 HHA   Hip Extension Stengthening;Right;Left;5 reps  At back of bike, +1 HHA   Forward Step Up Right;Left;5 reps;Hand Hold: 2;Step Height: 4"                PT Education - 10/04/16 1600    Education provided Yes   Education Details Balance and LE strength   Person(s) Educated Patient   Methods Explanation;Demonstration;Handout   Comprehension Verbalized understanding;Returned demonstration;Need further instruction            Short Term Clinic Goals - 09/27/16 2027      CC Short Term Goal  #1   Title Pt. will be independent with beginning home exercise program for general strengthening and conditioning.   Time 4   Period Weeks   Status New   Target Date 11/08/16     CC Short Term Goal  #2   Title Pt. will be able to walk at least 150 feet using a cane with close supervision.   Time 4   Period Weeks   Status New   Target Date 11/08/16             Long Term Clinic Goals - 09/27/16 2028      CC Long Term Goal  #1   Title Pt. will be able to walk 500 feet using a single point cane independently to improve  her mobility for return to work.   Time 8   Period Weeks   Status New   Target Date 12/08/16     CC Long Term Goal  #2   Title Pt. will report at least 50% greater ease with ascending stairs to get into the apartment where she lives.   Time 8   Period Weeks   Status New   Target Date 12/08/16     CC Long Term Goal  #3   Title Pt. will know a safe program of exercise to do at home or at the gym at her apartment complex.   Time 8   Period Weeks   Status New            Plan - 10/04/16 1605    Clinical Impression Statement Pt tolerated session well today though was very limited by fatigue and required multiple seated rest breaks. Issued HEP with instruction to begin this slowly and rest as needed.    Rehab Potential Good   Clinical Impairments Affecting Rehab Potential bony and other metastases   PT Frequency 2x / week   PT Duration 8 weeks   PT Treatment/Interventions ADLs/Self Care Home Management;Moist Heat;Cryotherapy;Gait training;Stair training;Functional mobility training;Therapeutic exercise;Balance training;Neuromuscular re-education;Patient/family education;Manual techniques   PT Next Visit Plan Cont strengthening with functional activities as opposed to weight training due to bony mets; gait and balance training   Consulted and Agree with Plan of Care Patient       Patient will benefit from skilled therapeutic intervention in order to improve the following deficits and impairments:  Abnormal gait, Decreased endurance, Decreased balance, Decreased strength  Visit Diagnosis: Muscle weakness (generalized)  Unsteadiness on feet  Difficulty in walking, not elsewhere classified     Problem List Patient Active Problem List   Diagnosis Date Noted  . AKI (acute kidney injury) ([place])   . Chest pain   . Hypercalcemia 09/05/2016  . ARF (acute renal failure) ([place]) 09/05/2016  . Bone metastases ([place]) 01/17/2016  . Genetic testing 10/15/2014  . Hypokalemia 06/17/2014  . Breast cancer metastasized to brain ([place])   . 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 Malnutrition of moderate degree ([place]) 04/13/2014  . Myelopathy ([place]) 04/12/2014  . Pathologic fracture of thoracic vertebrae 04/12/2014  . Biliary colic 10/01/3005  . Malignant neoplasm of upper-outer quadrant of right breast in female, estrogen receptor positive ([place]) 09/24/2013  . S/P total hysterectomy and bilateral salpingo-oophorectomy 12/23/2012    Otelia Limes, PTA 10/04/2016, 4:10 PM  [place], [place], [address] Phone: [phone]   Fax:  [phone]  Name: MARAL LAMPE MRN: [id number] Date of Birth: 1970/08/17

## 2016-10-04 NOTE — Progress Notes (Signed)
Received PA approval for Ondansetron via fax.  PA approved 10/03/16-04/02/17 as long as she remains covered by her state plan and there are no changes.

## 2016-10-04 NOTE — Patient Instructions (Signed)
Heel Raise: Bilateral (Standing)   Cancer Rehab [phone]    Stand near counter for fingertip support if needed. Rise on balls of feet. Repeat __10-20__ times per set. Do _1-2___ sets per session. Do __2__ sessions per day.  SINGLE LIMB STANCE    Stand at counter (corner if you have one) for minimal arm support. Raise leg. Hold _10-20__ seconds. Repeat with other leg. Once this becomes easier, for increased challenge, close eyes with fingertips on counter for safety. _3-5__ reps per set, _2-3__ sets per day.  Tandem Stance    Stand at counter (corner if you have one). Right foot in front of left, heel touching toe both feet "straight ahead". Stand on Foot Triangle of Support with both feet. Balance in this position _10-20__ seconds. Do with left foot in front of right. Once this becomes easier, for increased challenge, close eyes with fingertips on counter for safety.  Step-Up: Forward    Move step-stool to counter or hold rail if at steps, but as little as able. Step up forward keeping opposite foot off step. Keep pelvis level and back straight. Hold the single leg stand for 3 seconds, then come back down slowly.  Do _10__ times, do _1-2_ sets, on each leg, _1-2__ times per day.  Flexion and Extension - Standing    Stand and support self while swinging right leg forward holding for 3 seconds, then back to starting position trying to keep foot elevated throughout for balance exercise on supporting limb. Then backwards until you feel glut tighten and without leaning chest forward _5-10__ times each. Repeat with other leg. Do _2-3__ times per day.  Hip Abduction (Standing)    Stand with support. Keep tummy tight.. Lift right leg out to side, keeping toe forward.   Repeat _5-10__ times. Do _2__ times a day. Repeat with other leg.    Copyright  VHI. All rights reserved.

## 2016-10-04 NOTE — Progress Notes (Signed)
  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      ICD-10-CM   1. Bone metastases (Carolyn [name]) C79.51     Indication for treatment:  Palliative    Radiation treatment dates:   09/13/2016 to 09/26/2016  Site/dose:    1. The L-5 spine was treated to 30 Gy in 10 fraction at 3 Gy per fraction. 2. The Left pelvis was treated to 30 Gy in 10 fraction at 3 Gy per fraction.  Beams/energy:    1. Photon isodose // 15X 2. Photon isodose // 15X  Narrative: The patient tolerated radiation treatment relatively well.   Denies nausea. Reports pain has decreased with treatment, though still using walker to ambulate. States appetite is fair.  Plan: The patient has completed radiation treatment. The patient will return to radiation oncology clinic for routine followup in one month. I advised them to call or return sooner if they have any questions or concerns related to their recovery or treatment.  ------------------------------------------------  [name], MD, PhD  This document serves as a record of services personally performed by Kyung Rudd, MD. It was created on his behalf by Arlyce Harman, a trained medical scribe. The creation of this record is based on the scribe's personal observations and the provider's statements to them. This document has been checked and approved by the attending provider.

## 2016-10-06 ENCOUNTER — Encounter: Payer: Self-pay | Admitting: Physical Therapy

## 2016-10-06 ENCOUNTER — Ambulatory Visit: Payer: BC Managed Care – PPO | Admitting: Physical Therapy

## 2016-10-06 DIAGNOSIS — R2681 Unsteadiness on feet: Secondary | ICD-10-CM

## 2016-10-06 DIAGNOSIS — M6281 Muscle weakness (generalized): Secondary | ICD-10-CM | POA: Diagnosis not present

## 2016-10-06 DIAGNOSIS — R262 Difficulty in walking, not elsewhere classified: Secondary | ICD-10-CM

## 2016-10-06 NOTE — Therapy (Signed)
[place], [place], [address] Phone: [phone]   Fax:  [phone]  Physical Therapy Treatment  Patient Details  Name: Carolyn [name] MRN: [id number] Date of Birth: [birth year] Referring Provider: Dr. Gunnar Bulla Magrinat  Encounter Date: 10/06/2016      PT End of Session - 10/06/16 1152    Visit Number 3   Number of Visits 17   Date for PT Re-Evaluation 12/08/16   PT Start Time 1110  pt arrived late   PT Stop Time 1149   PT Time Calculation (min) 39 min   Activity Tolerance Patient tolerated treatment well   Behavior During Therapy [facility] for tasks assessed/performed      Past Medical History:  Diagnosis Date  . 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 History of chemotherapy 06/27/11 -09/04/11   neoadjuvant  . Hx of radiation therapy 11/08/11 -12/26/11   right chest wall/supraclav fossa, right scar  . S/P radiation therapy 05/08/14   SRS brain    Past Surgical History:  Procedure Laterality Date  . brain surgery-LITT Left 11/20/14   LITT procedure for Lt brain met.  Marland Kitchen BREAST BIOPSY     Left  . CHOLECYSTECTOMY N/A 03/01/2014   Procedure: LAPAROSCOPIC CHOLECYSTECTOMY;  Surgeon: Leighton Ruff, MD;  [facility];  Service: General;  Laterality: N/A;  . LAMINECTOMY N/A 04/13/2014   Procedure: THORACIC LAMINECTOMY WITH FIXATION THORACIC SIX-THORACIC TEN FUSION;  Surgeon: Kristeen Miss, MD;  Location: [place] [facility];  Service: Neurosurgery;  Laterality: N/A;  . MODIFIED MASTECTOMY  10/03/2011   Procedure: MODIFIED MASTECTOMY;  Surgeon: Haywood Lasso, MD;  Location: [place];  Service: General;  Laterality: Right;  . PORT-A-CATH REMOVAL  01/30/2012   Procedure: MINOR REMOVAL PORT-A-CATH;  Surgeon: Haywood Lasso, MD;  Location: [place];  Service: General;  Laterality: Left;  . PORTACATH PLACEMENT  06/20/2011   Procedure: INSERTION PORT-A-CATH;  Surgeon: Haywood Lasso, MD;  Location: [place];  Service: General;  Laterality: N/A;  . ROBOTIC ASSISTED TOTAL HYSTERECTOMY WITH BILATERAL SALPINGO OOPHERECTOMY Bilateral 12/23/2012   Procedure: ROBOTIC ASSISTED TOTAL HYSTERECTOMY WITH BILATERAL SALPINGO OOPHORECTOMY;  Surgeon: Marvene Staff, MD;  Location: [facility] [place] [facility];  Service: Gynecology;  Laterality: Bilateral;  . WISDOM TOOTH EXTRACTION      There were no vitals filed for this visit.      Subjective Assessment - 10/06/16 1111    Subjective I felt pretty good but it was a little tiring.    Pertinent History BRCA negative [place] woman status post right breast upper outer quadrant and right axillary lymph node biopsy 05/18/2011, both positive for an invasive ductal carcinoma, high-grade, clinicallyT2 N1-2 or stage IIB/IIIA,. status post right modified radical mastectomy 10/03/2011. presented with T8 cord compression and underwent laminectomy 04/13/2014 with T8 decompression of spinal cord, posterior fixation from T6-T10 with pedicle screws and rods. Had LITT brain surgery at [facility] in 2016 to remove a tumor.Has had XRT to brain and thoracic spine. MRI of the thoracolumbar spine shows diffuse bony metastases particularly involving T8, T11 and T12, but without epidural disease. There were subcentimeter pulmonary nodules and indeterminate liver lesions. CT scan of the left hip 09/06/2016 showed multiple bony metastases but no fracture.  Recently had 2 weeks of XRT to left hip. (Most of this history from Dr. Virgie Dad recent note.)   Patient Stated Goals to walk, try to get my balance; walk  without the walker, but with a cane   Currently in Pain? No/denies   Pain Score 0-No pain                         [facility] Adult PT Treatment/Exercise - 10/06/16 0001      Neuro Re-ed    Neuro Re-ed Details  standing on airex: eyes open narrow BOS x 1 min, eyes closed x 30 sec, tandem stance each leg  forward x 30 sec each, marching x 10 reps,      Lumbar Exercises: Supine   Clam 10 reps  bilaterally in S/L     Knee/Hip Exercises: Aerobic   Recumbent Bike Level 1, x 2 minutes  HR 119, O2 95% stopped due to high HR     Knee/Hip Exercises: Standing   Heel Raises 10 reps   Hip Flexion Stengthening;Right;Left;10 reps  At back of bike for 1 HHA   Hip Abduction Stengthening;Right;Left;10 reps  HHA x 2 on counter, difficulty keeping knee straight   Hip Extension Stengthening;Right;Left;10 reps  At back of bike, +1 HHA   Forward Step Up Right;Left;Hand Hold: 2;Step Height: 4";20 reps   Other Standing Knee Exercises sit to stand from chair x 10 with use of 1 UE for support     Knee/Hip Exercises: Supine   Quad Sets Strengthening;Both;1 set;10 reps  with 5 sec holds   Bridges 10 reps   Straight Leg Raises Strengthening;Both;10 reps  only able to move through partial ROM on R                   Short Term Clinic Goals - 09/27/16 2027      CC Short Term Goal  #1   Title Pt. will be independent with beginning home exercise program for general strengthening and conditioning.   Time 4   Period Weeks   Status New   Target Date 11/08/16     CC Short Term Goal  #2   Title Pt. will be able to walk at least 150 feet using a cane with close supervision.   Time 4   Period Weeks   Status New   Target Date 11/08/16             Long Term Clinic Goals - 09/27/16 2028      CC Long Term Goal  #1   Title Pt. will be able to walk 500 feet using a single point cane independently to improve her mobility for return to work.   Time 8   Period Weeks   Status New   Target Date 12/08/16     CC Long Term Goal  #2   Title Pt. will report at least 50% greater ease with ascending stairs to get into the apartment where she lives.   Time 8   Period Weeks   Status New   Target Date 12/08/16     CC Long Term Goal  #3   Title Pt. will know a safe program of exercise to do at home  or at the gym at her apartment complex.   Time 8   Period Weeks   Status New            Plan - 10/06/16 1153    Clinical Impression Statement Added balance exercise today and progressed exercises from last session. Issued SLR, quad sets and bridges as part of HEP. Pt required few recovery periods today. At end of session her HR  was elevated to 119 bpm so only had pt complete 2 min on exercise bike. Pt states she felt like she had a good workout and reports feeling better at end of session today.    Rehab Potential Good   Clinical Impairments Affecting Rehab Potential bony and other metastases   PT Frequency 2x / week   PT Duration 8 weeks   PT Treatment/Interventions ADLs/Self Care Home Management;Moist Heat;Cryotherapy;Gait training;Stair training;Functional mobility training;Therapeutic exercise;Balance training;Neuromuscular re-education;Patient/family education;Manual techniques   PT Next Visit Plan add walking in parallel bars forward, backward, braiding, sidestepping etc. Cont strengthening with functional activities as opposed to weight training due to bony mets; gait and balance training   Consulted and Agree with Plan of Care Patient      Patient will benefit from skilled therapeutic intervention in order to improve the following deficits and impairments:  Abnormal gait, Decreased endurance, Decreased balance, Decreased strength  Visit Diagnosis: Muscle weakness (generalized)  Unsteadiness on feet  Difficulty in walking, not elsewhere classified     Problem List Patient Active Problem List   Diagnosis Date Noted  . AKI (acute kidney injury) ([place])   . Chest pain   . Hypercalcemia 09/05/2016  . ARF (acute renal failure) ([place]) 09/05/2016  . Bone metastases ([place]) 01/17/2016  . Genetic testing 10/15/2014  . Hypokalemia 06/17/2014  . Breast cancer metastasized to brain ([place])   . 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 Malnutrition of moderate degree ([place])  04/13/2014  . Myelopathy ([place]) 04/12/2014  . Pathologic fracture of thoracic vertebrae 04/12/2014  . Biliary colic 70/62/3762  . Malignant neoplasm of upper-outer quadrant of right breast in female, estrogen receptor positive ([place]) 09/24/2013  . S/P total hysterectomy and bilateral salpingo-oophorectomy 12/23/2012    Allyson Sabal [facility] 10/06/2016, 11:59 AM  [place], [place], [address] Phone: [phone]   Fax:  [phone]  Name: Carolyn [name] MRN: [id number] Date of Birth: [birth year]  Manus Gunning, PT 10/06/16 11:59 AM

## 2016-10-06 NOTE — Patient Instructions (Signed)
Straight Leg Raise    Tighten stomach and slowly raise locked right leg _12___ inches from floor. Repeat _10___ times per set. Repeat on left side. Do __1__ sets per session. Do __1__ sessions per day.  http://orth.exer.us/1103   Copyright  VHI. All rights reserved.  Bridge    Lie back, legs bent. Inhale, pressing hips up. Keeping ribs in, lengthen lower back. Exhale, rolling down along spine from top. Repeat _10___ times. Do __1__ sessions per day.  http://pm.exer.us/55   Copyright  VHI. All rights reserved.  Quad Sets    Slowly tighten thigh muscles of straight, left leg while counting out loud to _5___. Relax. Repeat __10__ times. Repeat on right side.  Do __1__ sessions per day.  http://gt2.exer.us/293   Copyright  VHI. All rights reserved.

## 2016-10-09 ENCOUNTER — Other Ambulatory Visit: Payer: Self-pay | Admitting: *Deleted

## 2016-10-10 ENCOUNTER — Ambulatory Visit: Payer: BC Managed Care – PPO | Attending: Oncology | Admitting: Physical Therapy

## 2016-10-10 ENCOUNTER — Other Ambulatory Visit: Payer: Self-pay | Admitting: Hematology and Oncology

## 2016-10-10 DIAGNOSIS — R6889 Other general symptoms and signs: Secondary | ICD-10-CM | POA: Diagnosis present

## 2016-10-10 DIAGNOSIS — R262 Difficulty in walking, not elsewhere classified: Secondary | ICD-10-CM | POA: Diagnosis present

## 2016-10-10 DIAGNOSIS — R531 Weakness: Secondary | ICD-10-CM | POA: Diagnosis present

## 2016-10-10 DIAGNOSIS — R2681 Unsteadiness on feet: Secondary | ICD-10-CM | POA: Insufficient documentation

## 2016-10-10 DIAGNOSIS — M6281 Muscle weakness (generalized): Secondary | ICD-10-CM | POA: Insufficient documentation

## 2016-10-10 NOTE — Therapy (Signed)
[place], [place], [address] Phone: [phone]   Fax:  [phone]  Physical Therapy Treatment  Patient Details  Name: Carolyn [name] MRN: [id number] Date of Birth: [birth year] Referring Provider: Dr. Gunnar Bulla Magrinat  Encounter Date: 10/10/2016      PT End of Session - 10/10/16 1609    Visit Number 4   Number of Visits 17   Date for PT Re-Evaluation 12/08/16   PT Start Time 1520   PT Stop Time 1606   PT Time Calculation (min) 46 min   Equipment Utilized During Treatment Gait belt   Activity Tolerance Patient tolerated treatment well   Behavior During Therapy [facility] for tasks assessed/performed      Past Medical History:  Diagnosis Date  . 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 History of chemotherapy 06/27/11 -09/04/11   neoadjuvant  . Hx of radiation therapy 11/08/11 -12/26/11   right chest wall/supraclav fossa, right scar  . S/P radiation therapy 05/08/14   SRS brain    Past Surgical History:  Procedure Laterality Date  . brain surgery-LITT Left 11/20/14   LITT procedure for Lt brain met.  Marland Kitchen BREAST BIOPSY     Left  . CHOLECYSTECTOMY N/A 03/01/2014   Procedure: LAPAROSCOPIC CHOLECYSTECTOMY;  Surgeon: Leighton Ruff, MD;  [facility];  Service: General;  Laterality: N/A;  . LAMINECTOMY N/A 04/13/2014   Procedure: THORACIC LAMINECTOMY WITH FIXATION THORACIC SIX-THORACIC TEN FUSION;  Surgeon: Kristeen Miss, MD;  Location: [place] [facility];  Service: Neurosurgery;  Laterality: N/A;  . MODIFIED MASTECTOMY  10/03/2011   Procedure: MODIFIED MASTECTOMY;  Surgeon: Haywood Lasso, MD;  Location: [place];  Service: General;  Laterality: Right;  . PORT-A-CATH REMOVAL  01/30/2012   Procedure: MINOR REMOVAL PORT-A-CATH;  Surgeon: Haywood Lasso, MD;  Location: [place];  Service: General;  Laterality: Left;  . PORTACATH  PLACEMENT  06/20/2011   Procedure: INSERTION PORT-A-CATH;  Surgeon: Haywood Lasso, MD;  Location: [place];  Service: General;  Laterality: N/A;  . ROBOTIC ASSISTED TOTAL HYSTERECTOMY WITH BILATERAL SALPINGO OOPHERECTOMY Bilateral 12/23/2012   Procedure: ROBOTIC ASSISTED TOTAL HYSTERECTOMY WITH BILATERAL SALPINGO OOPHORECTOMY;  Surgeon: Marvene Staff, MD;  Location: [place] [facility];  Service: Gynecology;  Laterality: Bilateral;  . WISDOM TOOTH EXTRACTION      There were no vitals filed for this visit.      Subjective Assessment - 10/10/16 1524    Subjective I'm taking it one day at a time.  Doing the exercises-it's a lot.   Currently in Pain? No/denies                         [facility] Adult PT Treatment/Exercise - 10/10/16 0001      Ambulation/Gait   Ambulation/Gait Assistance 4: Min guard   Ambulation Distance (Feet) 48 Feet   Assistive device Parallel bars  left hand only on bars, then narrow based quad cane on Lt.   Gait Pattern Step-through pattern   Gait Comments Tried narrow based quad cane x approx. 150 feet with contact guard assist (pt. with moderate dyspnea following); then tried it with cane in right hand; she was less stable with cane in right hand so chooses to use cane in left even though she is more used to it in right.  Lt. is stronger side; she is used to cane in right hand  High Level Balance   High Level Balance Activities Side stepping;Backward walking;Tandem walking   High Level Balance Comments All in parallel bars, 1 UE support for tandem walk, 0 UE support for backward and sideways wallking, each 10 feet x 3 trips. Also sidestepping with one foot crossing over in front, 1 UE support x 10 x 2 each way.  Sidestepping with one foot crossing over in front, then behind x 10 feet each way, once with 2-hand and once with 1- hand support in parallel bars.     Exercises   Exercises Other Exercises   Other Exercises  Discussed HEP that she has been given  at last two sessions.  She feels it's a lot.  The supine SLR is difficult for her, I advised her to leave this out for now, as she is doing that motion in standing anyway.                   Short Term Clinic Goals - 10/10/16 1612      CC Short Term Goal  #1   Title Pt. will be independent with beginning home exercise program for general strengthening and conditioning.   Status Partially Met     CC Short Term Goal  #2   Title Pt. will be able to walk at least 150 feet using a cane with close supervision.   Status On-going             Long Term Clinic Goals - 09/27/16 2028      CC Long Term Goal  #1   Title Pt. will be able to walk 500 feet using a single point cane independently to improve her mobility for return to work.   Time 8   Period Weeks   Status New   Target Date 12/08/16     CC Long Term Goal  #2   Title Pt. will report at least 50% greater ease with ascending stairs to get into the apartment where she lives.   Time 8   Period Weeks   Status New   Target Date 12/08/16     CC Long Term Goal  #3   Title Pt. will know a safe program of exercise to do at home or at the gym at her apartment complex.   Time 8   Period Weeks   Status New            Plan - 10/10/16 1609    Clinical Impression Statement Pt. did well with gait challenges in parallel bars and also gait trial with a narrow-based quad cane.  She did need 1 or 2 hand hold on parallel bars and/or contact guard assist for gait activities today.  She was asked to bring her single point cane next visit for [place] to be able to assess how safe she is currently with walking with that.  She is used to using it in the right hand but will switch to using it in the left, as she is stronger on the left and was more stable using cane on the left today.   Rehab Potential Good   Clinical Impairments Affecting Rehab Potential bony and other metastases   PT Frequency 2x / week   PT Duration 8 weeks   PT  Treatment/Interventions ADLs/Self Care Home Management;Moist Heat;Cryotherapy;Gait training;Stair training;Functional mobility training;Therapeutic exercise;Balance training;Neuromuscular re-education;Patient/family education;Manual techniques   PT Next Visit Plan Try gait with her singel point cane; continue walking in parallel bars forward, backward, braiding, sidestepping etc.  Cont strengthening with functional activities as opposed to weight training due to bony mets; gait and balance training   Consulted and Agree with Plan of Care Patient      Patient will benefit from skilled therapeutic intervention in order to improve the following deficits and impairments:  Abnormal gait, Decreased endurance, Decreased balance, Decreased strength  Visit Diagnosis: Muscle weakness (generalized)  Unsteadiness on feet  Difficulty in walking, not elsewhere classified     Problem List Patient Active Problem List   Diagnosis Date Noted  . AKI (acute kidney injury) ([place])   . Chest pain   . Hypercalcemia 09/05/2016  . ARF (acute renal failure) ([place]) 09/05/2016  . Bone metastases ([place]) 01/17/2016  . Genetic testing 10/15/2014  . Hypokalemia 06/17/2014  . Breast cancer metastasized to brain ([place])   . 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 Malnutrition of moderate degree ([place]) 04/13/2014  . Myelopathy ([place]) 04/12/2014  . Pathologic fracture of thoracic vertebrae 04/12/2014  . Biliary colic 28/36/6294  . Malignant neoplasm of upper-outer quadrant of right breast in female, estrogen receptor positive ([place]) 09/24/2013  . S/P total hysterectomy and bilateral salpingo-oophorectomy 12/23/2012    Carolyn [name] 10/10/2016, 4:13 PM  [place], [place], [address] Phone: [phone]   Fax:  [phone]  Name: Carolyn [name] MRN: [id number] Date of Birth: 07-11-1970  Serafina Royals, PT 10/10/16 4:13 PM

## 2016-10-11 ENCOUNTER — Other Ambulatory Visit: Payer: Self-pay

## 2016-10-11 ENCOUNTER — Telehealth: Payer: Self-pay

## 2016-10-11 ENCOUNTER — Other Ambulatory Visit (HOSPITAL_BASED_OUTPATIENT_CLINIC_OR_DEPARTMENT_OTHER): Payer: BC Managed Care – PPO

## 2016-10-11 ENCOUNTER — Ambulatory Visit (HOSPITAL_BASED_OUTPATIENT_CLINIC_OR_DEPARTMENT_OTHER): Payer: BC Managed Care – PPO

## 2016-10-11 VITALS — BP 138/65 | HR 100 | Temp 98.0°F | Resp 18

## 2016-10-11 DIAGNOSIS — C7931 Secondary malignant neoplasm of brain: Principal | ICD-10-CM

## 2016-10-11 DIAGNOSIS — R748 Abnormal levels of other serum enzymes: Secondary | ICD-10-CM

## 2016-10-11 DIAGNOSIS — C50919 Malignant neoplasm of unspecified site of unspecified female breast: Secondary | ICD-10-CM

## 2016-10-11 DIAGNOSIS — C773 Secondary and unspecified malignant neoplasm of axilla and upper limb lymph nodes: Secondary | ICD-10-CM | POA: Diagnosis not present

## 2016-10-11 DIAGNOSIS — C50411 Malignant neoplasm of upper-outer quadrant of right female breast: Secondary | ICD-10-CM

## 2016-10-11 DIAGNOSIS — C7951 Secondary malignant neoplasm of bone: Secondary | ICD-10-CM | POA: Diagnosis not present

## 2016-10-11 LAB — COMPREHENSIVE METABOLIC PANEL
ALBUMIN: 3.2 g/dL — AB (ref 3.5–5.0)
ALK PHOS: 201 U/L — AB (ref 40–150)
ALT: 183 U/L — ABNORMAL HIGH (ref 0–55)
AST: 151 U/L — AB (ref 5–34)
Anion Gap: 12 mEq/L — ABNORMAL HIGH (ref 3–11)
BUN: 15.1 mg/dL (ref 7.0–26.0)
CO2: 24 meq/L (ref 22–29)
Calcium: 10 mg/dL (ref 8.4–10.4)
Chloride: 106 mEq/L (ref 98–109)
Creatinine: 1.2 mg/dL — ABNORMAL HIGH (ref 0.6–1.1)
EGFR: 61 mL/min/{1.73_m2} — AB (ref >90)
GLUCOSE: 128 mg/dL (ref 70–140)
POTASSIUM: 4.3 meq/L (ref 3.5–5.1)
SODIUM: 142 meq/L (ref 136–145)
TOTAL PROTEIN: 8.2 g/dL (ref 6.4–8.3)
Total Bilirubin: 0.29 mg/dL (ref 0.20–1.20)

## 2016-10-11 LAB — CBC WITH DIFFERENTIAL/PLATELET
BASO%: 0 % (ref 0.0–2.0)
BASOS ABS: 0 10*3/uL (ref 0.0–0.1)
EOS ABS: 0 10*3/uL (ref 0.0–0.5)
EOS%: 1 % (ref 0.0–7.0)
HCT: 25.6 % — ABNORMAL LOW (ref 34.8–46.6)
HEMOGLOBIN: 8.1 g/dL — AB (ref 11.6–15.9)
LYMPH%: 25 % (ref 14.0–49.7)
MCH: 27.9 pg (ref 25.1–34.0)
MCHC: 31.6 g/dL (ref 31.5–36.0)
MCV: 88.3 fL (ref 79.5–101.0)
MONO#: 0.3 10*3/uL (ref 0.1–0.9)
MONO%: 7.5 % (ref 0.0–14.0)
NEUT#: 2.6 10*3/uL (ref 1.5–6.5)
NEUT%: 66.5 % (ref 38.4–76.8)
NRBC: 0 % (ref 0–0)
Platelets: 38 10*3/uL — ABNORMAL LOW (ref 145–400)
RBC: 2.9 10*6/uL — AB (ref 3.70–5.45)
RDW: 16.8 % — ABNORMAL HIGH (ref 11.2–14.5)
WBC: 3.9 10*3/uL (ref 3.9–10.3)
lymph#: 1 10*3/uL (ref 0.9–3.3)

## 2016-10-11 LAB — TECHNOLOGIST REVIEW

## 2016-10-11 MED ORDER — DENOSUMAB 120 MG/1.7ML ~~LOC~~ SOLN
120.0000 mg | Freq: Once | SUBCUTANEOUS | Status: AC
  Administered 2016-10-11: 120 mg via SUBCUTANEOUS
  Filled 2016-10-11: qty 1.7

## 2016-10-11 NOTE — Patient Instructions (Signed)

## 2016-10-11 NOTE — Progress Notes (Signed)
OK to give Xgeva with platelets of38 per Dr. Lindi Adie

## 2016-10-11 NOTE — Telephone Encounter (Signed)
PLTs 38, ALT 183, AST 151.  Per Dr Lindi Adie, [place] to receive Delton See shot today but pt should stop taking exemestane.  Pt has also been scheduled for an abdominal US on 10/4.  Pt made aware of above and verbalizes understanding

## 2016-10-12 ENCOUNTER — Other Ambulatory Visit: Payer: Self-pay | Admitting: Oncology

## 2016-10-12 ENCOUNTER — Ambulatory Visit (HOSPITAL_COMMUNITY)
Admission: RE | Admit: 2016-10-12 | Discharge: 2016-10-12 | Disposition: A | Discharge status: Home / Self Care | Payer: BC Managed Care – PPO | Source: Ambulatory Visit | Attending: Adult Health | Admitting: Adult Health

## 2016-10-12 DIAGNOSIS — K769 Liver disease, unspecified: Secondary | ICD-10-CM | POA: Insufficient documentation

## 2016-10-12 DIAGNOSIS — Z9049 Acquired absence of other specified parts of digestive tract: Secondary | ICD-10-CM | POA: Diagnosis not present

## 2016-10-12 DIAGNOSIS — R748 Abnormal levels of other serum enzymes: Secondary | ICD-10-CM

## 2016-10-12 DIAGNOSIS — Z1231 Encounter for screening mammogram for malignant neoplasm of breast: Secondary | ICD-10-CM

## 2016-10-13 ENCOUNTER — Ambulatory Visit: Payer: BC Managed Care – PPO | Admitting: Physical Therapy

## 2016-10-13 ENCOUNTER — Telehealth: Payer: Self-pay | Admitting: Oncology

## 2016-10-13 DIAGNOSIS — R2681 Unsteadiness on feet: Secondary | ICD-10-CM

## 2016-10-13 DIAGNOSIS — R6889 Other general symptoms and signs: Secondary | ICD-10-CM

## 2016-10-13 DIAGNOSIS — R531 Weakness: Secondary | ICD-10-CM

## 2016-10-13 DIAGNOSIS — R262 Difficulty in walking, not elsewhere classified: Secondary | ICD-10-CM

## 2016-10-13 DIAGNOSIS — M6281 Muscle weakness (generalized): Secondary | ICD-10-CM | POA: Diagnosis not present

## 2016-10-13 NOTE — Telephone Encounter (Signed)
Left voicemail for patient regarding added lab appt on 10/8 per 10/4 sch msg.

## 2016-10-13 NOTE — Patient Instructions (Signed)
Warrior I Pose    In wide stride stance, feet facing forward, bend right knee and extend left leg behind, heel elevated. Distribute weight equally between front foot and ball of back foot. Extend arms upward beside ears. Hold for __2_ breaths. Repeat with other leg forward. Repeat _2__ times, alternating legs.  Copyright  VHI. All rights reserved.   Abductor Strength: Tree Pose (Wall)    Hips level, squeeze outer hip of standing leg. Use wall for balance if needed. DO NOT try to put foot on the thigh, keep it on the floor and put the heel on your other foot like a "kickstand  Hold for _2___ breaths  Copyright  VHI. All rights reserved.

## 2016-10-13 NOTE — Therapy (Signed)
[place], [place], [address] Phone: [phone]   Fax:  [phone]  Physical Therapy Treatment  Patient Details  Name: Carolyn [name] MRN: [id number] Date of Birth: 03-19-1970 Referring Provider: Dr. Gunnar Bulla Magrinat  Encounter Date: 10/13/2016      PT End of Session - 10/13/16 1208    Visit Number 5   Number of Visits 17   Date for PT Re-Evaluation 12/08/16   PT Start Time 1225   PT Stop Time 1100   PT Time Calculation (min) 45 min   Activity Tolerance Patient tolerated treatment well   Behavior During Therapy [facility] for tasks assessed/performed      Past Medical History:  Diagnosis Date  . 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 History of chemotherapy 06/27/11 -09/04/11   neoadjuvant  . Hx of radiation therapy 11/08/11 -12/26/11   right chest wall/supraclav fossa, right scar  . S/P radiation therapy 05/08/14   SRS brain    Past Surgical History:  Procedure Laterality Date  . brain surgery-LITT Left 11/20/14   LITT procedure for Lt brain met.  Marland Kitchen BREAST BIOPSY     Left  . CHOLECYSTECTOMY N/A 03/01/2014   Procedure: LAPAROSCOPIC CHOLECYSTECTOMY;  Surgeon: Leighton Ruff, MD;  [facility];  Service: General;  Laterality: N/A;  . LAMINECTOMY N/A 04/13/2014   Procedure: THORACIC LAMINECTOMY WITH FIXATION THORACIC SIX-THORACIC TEN FUSION;  Surgeon: Kristeen Miss, MD;  Location: [place] [facility];  Service: Neurosurgery;  Laterality: N/A;  . MODIFIED MASTECTOMY  10/03/2011   Procedure: MODIFIED MASTECTOMY;  Surgeon: Haywood Lasso, MD;  Location: [place];  Service: General;  Laterality: Right;  . PORT-A-CATH REMOVAL  01/30/2012   Procedure: MINOR REMOVAL PORT-A-CATH;  Surgeon: Haywood Lasso, MD;  Location: [place];  Service: General;  Laterality: Left;  . PORTACATH PLACEMENT  06/20/2011   Procedure: INSERTION  PORT-A-CATH;  Surgeon: Haywood Lasso, MD;  Location: [place];  Service: General;  Laterality: N/A;  . ROBOTIC ASSISTED TOTAL HYSTERECTOMY WITH BILATERAL SALPINGO OOPHERECTOMY Bilateral 12/23/2012   Procedure: ROBOTIC ASSISTED TOTAL HYSTERECTOMY WITH BILATERAL SALPINGO OOPHORECTOMY;  Surgeon: Marvene Staff, MD;  Location: [place] [facility];  Service: Gynecology;  Laterality: Bilateral;  . WISDOM TOOTH EXTRACTION      There were no vitals filed for this visit.      Subjective Assessment - 10/13/16 1025    Subjective pt smiling, doing " pretty good"  she brings her straight cane today and has not using it at all    Pertinent History BRCA negative [place] woman status post right breast upper outer quadrant and right axillary lymph node biopsy 05/18/2011, both positive for an invasive ductal carcinoma, high-grade, clinicallyT2 N1-2 or stage IIB/IIIA,. status post right modified radical mastectomy 10/03/2011. presented with T8 cord compression and underwent laminectomy 04/13/2014 with T8 decompression of spinal cord, posterior fixation from T6-T10 with pedicle screws and rods. Had LITT brain surgery at [facility] in 2016 to remove a tumor.Has had XRT to brain and thoracic spine. MRI of the thoracolumbar spine shows diffuse bony metastases particularly involving T8, T11 and T12, but without epidural disease. There were subcentimeter pulmonary nodules and indeterminate liver lesions. CT scan of the left hip 09/06/2016 showed multiple bony metastases but no fracture.  Recently had 2 weeks of XRT to left hip. (Most of this history from Dr. Virgie Dad recent note.)   Patient Stated Goals to  walk, try to get my balance; walk without the walker, but with a cane   Currently in Pain? No/denies                         [facility] Adult PT Treatment/Exercise - 10/13/16 0001      Ambulation/Gait   Ambulation/Gait Yes   Ambulation/Gait Assistance 5: Supervision   Ambulation Distance (Feet) 200 Feet    Assistive device Straight cane;Parallel bars   Gait Pattern Step-to pattern  cane in left hand    Pre-Gait Activities staggered step weight shift with rhythmic swinging of arms with visual cues to keep core stable.      High Level Balance   High Level Balance Activities Side stepping;Backward walking;Direction changes;Marching forwards;Other (comment)   High Level Balance Comments all in //bars with use of handrail as needed.  also did side step with mini squats      Balance Poses: Yoga   Warrior I 3 reps  5 sec hold, also did mini chair pose with arms up for 5 sec    Tree Pose 2 reps  10 sec with modified "kickstand"     Knee/Hip Exercises: Standing   Hip Flexion Stengthening;Right;Left;5 reps   Hip Flexion Limitations in // bars    Forward Lunges Right;Left;5 reps   Forward Lunges Limitations much difficulty with these    Hip Abduction Stengthening;Right;Left;5 reps   Abduction Limitations cues for pelvic stability    Other Standing Knee Exercises toe raises x 10 with each leg.     Other Standing Knee Exercises standing balance and stability  with manual hand placements at various positions for alternating isometrics   pt tends to lose her balance backward                    Short Term Clinic Goals - 10/10/16 1612      CC Short Term Goal  #1   Title Pt. will be independent with beginning home exercise program for general strengthening and conditioning.   Status Partially Met     CC Short Term Goal  #2   Title Pt. will be able to walk at least 150 feet using a cane with close supervision.   Status On-going             Long Term Clinic Goals - 09/27/16 2028      CC Long Term Goal  #1   Title Pt. will be able to walk 500 feet using a single point cane independently to improve her mobility for return to work.   Time 8   Period Weeks   Status New   Target Date 12/08/16     CC Long Term Goal  #2   Title Pt. will report at least 50% greater ease with  ascending stairs to get into the apartment where she lives.   Time 8   Period Weeks   Status New   Target Date 12/08/16     CC Long Term Goal  #3   Title Pt. will know a safe program of exercise to do at home or at the gym at her apartment complex.   Time 8   Period Weeks   Status New            Plan - 10/13/16 1208    Clinical Impression Statement Pt did very well with staight cane for gait today.  She had some unsteadiness observable with Right LE but did not lose  her balance.  Worked on core stablility and hip strenght in // bars and pt really enjoyed doing yoga poses and wants to keep doing more of these    Rehab Potential Good   PT Next Visit Plan continue with her singel point cane; try step training  continue walking in parallel bars forward, backward, braiding, sidestepping etc. Cont strengthening with functional activities as opposed to weight training due to bony mets; gait and balance training explore yoga poses/options  for home exercise    Consulted and Agree with Plan of Care Patient      Patient will benefit from skilled therapeutic intervention in order to improve the following deficits and impairments:  Abnormal gait, Decreased endurance, Decreased balance, Decreased strength  Visit Diagnosis: Muscle weakness (generalized)  Unsteadiness on feet  Difficulty in walking, not elsewhere classified  Weakness generalized  Activity intolerance     Problem List Patient Active Problem List   Diagnosis Date Noted  . AKI (acute kidney injury) ([place])   . Chest pain   . Hypercalcemia 09/05/2016  . ARF (acute renal failure) ([place]) 09/05/2016  . Bone metastases ([place]) 01/17/2016  . Genetic testing 10/15/2014  . Hypokalemia 06/17/2014  . Breast cancer metastasized to brain ([place])   . 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 Malnutrition of moderate degree ([place]) 04/13/2014  . Myelopathy ([place]) 04/12/2014  . Pathologic fracture of thoracic vertebrae 04/12/2014   . Biliary colic 40/34/7425  . Malignant neoplasm of upper-outer quadrant of right breast in female, estrogen receptor positive ([place]) 09/24/2013  . S/P total hysterectomy and bilateral salpingo-oophorectomy 12/23/2012   Donato Heinz. Owens Shark PT  [place] 10/13/2016, 12:13 PM  [place], [place], [address] Phone: [phone]   Fax:  [phone]  Name: Carolyn [name] MRN: [id number] Date of Birth: 1970-09-28

## 2016-10-16 ENCOUNTER — Other Ambulatory Visit: Payer: BC Managed Care – PPO

## 2016-10-17 ENCOUNTER — Ambulatory Visit: Payer: BC Managed Care – PPO

## 2016-10-17 DIAGNOSIS — R6889 Other general symptoms and signs: Secondary | ICD-10-CM

## 2016-10-17 DIAGNOSIS — M6281 Muscle weakness (generalized): Secondary | ICD-10-CM

## 2016-10-17 DIAGNOSIS — R531 Weakness: Secondary | ICD-10-CM

## 2016-10-17 DIAGNOSIS — R262 Difficulty in walking, not elsewhere classified: Secondary | ICD-10-CM

## 2016-10-17 DIAGNOSIS — R2681 Unsteadiness on feet: Secondary | ICD-10-CM

## 2016-10-17 NOTE — Therapy (Signed)
[place], [place], [address] Phone: [phone]   Fax:  [phone]  Physical Therapy Treatment  Patient Details  Name: Carolyn [name] MRN: [id number] Date of Birth: [birth year] Referring Provider: Dr. Gunnar Bulla Magrinat  Encounter Date: 10/17/2016      PT End of Session - 10/17/16 1101    Visit Number 6   Number of Visits 17   Date for PT Re-Evaluation 12/08/16   PT Start Time 1025   PT Stop Time 1105   PT Time Calculation (min) 40 min   Activity Tolerance Patient tolerated treatment well   Behavior During Therapy [facility] for tasks assessed/performed      Past Medical History:  Diagnosis Date  . 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 History of chemotherapy 06/27/11 -09/04/11   neoadjuvant  . Hx of radiation therapy 11/08/11 -12/26/11   right chest wall/supraclav fossa, right scar  . S/P radiation therapy 05/08/14   SRS brain    Past Surgical History:  Procedure Laterality Date  . brain surgery-LITT Left 11/20/14   LITT procedure for Lt brain met.  [facility] Kitchen BREAST BIOPSY     Left  . CHOLECYSTECTOMY N/A 03/01/2014   Procedure: LAPAROSCOPIC CHOLECYSTECTOMY;  Surgeon: Leighton Ruff, MD;  Location: [facility];  Service: General;  Laterality: N/A;  . LAMINECTOMY N/A 04/13/2014   Procedure: THORACIC LAMINECTOMY WITH FIXATION THORACIC SIX-THORACIC TEN FUSION;  Surgeon: Kristeen Miss, MD;  Location: [facility];  Service: Neurosurgery;  Laterality: N/A;  . MODIFIED MASTECTOMY  10/03/2011   Procedure: MODIFIED MASTECTOMY;  Surgeon: Haywood Lasso, MD;  Location: [facility];  Service: General;  Laterality: Right;  . PORT-A-CATH REMOVAL  01/30/2012   Procedure: MINOR REMOVAL PORT-A-CATH;  Surgeon: Haywood Lasso, MD;  Location: [facility];  Service: General;  Laterality: Left;  . PORTACATH PLACEMENT  06/20/2011   Procedure: INSERTION  PORT-A-CATH;  Surgeon: Haywood Lasso, MD;  Location: [facility];  Service: General;  Laterality: N/A;  . ROBOTIC ASSISTED TOTAL HYSTERECTOMY WITH BILATERAL SALPINGO OOPHERECTOMY Bilateral 12/23/2012   Procedure: ROBOTIC ASSISTED TOTAL HYSTERECTOMY WITH BILATERAL SALPINGO OOPHORECTOMY;  Surgeon: Marvene Staff, MD;  Location: [facility];  Service: Gynecology;  Laterality: Bilateral;  . WISDOM TOOTH EXTRACTION      There were no vitals filed for this visit.      Subjective Assessment - 10/17/16 1029    Subjective Felt good after last sesion, a little fatigue but overall felt good. Been using my can emore around the house and when I'm out and about. I went swimming for first time in awhile yesterday and it felt so good to do aerobics in the water!   Pertinent History BRCA negative [place] woman status post right breast upper outer quadrant and right axillary lymph node biopsy 05/18/2011, both positive for an invasive ductal carcinoma, high-grade, clinicallyT2 N1-2 or stage IIB/IIIA,. status post right modified radical mastectomy 10/03/2011. presented with T8 cord compression and underwent laminectomy 04/13/2014 with T8 decompression of spinal cord, posterior fixation from T6-T10 with pedicle screws and rods. Had L[facility] brain surgery at [facility] in 2016 to remove a tumor.Has had XRT to brain and thoracic spine. MRI of the thoracolumbar spine shows diffuse bony metastases particularly involving T8, T11 and T12, but without epidural disease. There were subcentimeter pulmonary nodules and indeterminate liver lesions. CT scan of the left hip 09/06/2016 showed multiple bony metastases but no fracture.  Recently had 2 weeks of XRT to left hip. (Most of this history from Dr. Virgie Dad recent note.)   Patient Stated Goals to walk, try to get my balance; walk without the walker, but with a cane   Currently in Pain? No/denies                         [facility] Adult PT Treatment/Exercise - 10/17/16  0001      High Level Balance   High Level Balance Activities Side stepping;Backward walking;Marching forwards;Other (comment)   High Level Balance Comments all in //bars with use of handrail as needed: Also did side step with mini squats 1 time each side, and front and retro heel-toe walking 4 times each; figure 8 walking 2 times each way     Knee/Hip Exercises: Standing   Heel Raises Both;10 reps   Heel Raises Limitations In // bars   Hip Flexion Stengthening;Right;Left;10 reps;Other (comment)   Hip Flexion Limitations In // bars and VC to focus on hip flexors not abductors performing motion. Standing on [employer].   Forward Lunges Right;Left;5 reps   Forward Lunges Limitations In // bars, pt reported very difficult   Hip Abduction Stengthening;Right;Left;10 reps   Abduction Limitations In // bars on Airex   Forward Step Up Right;Left;10 reps;Hand Hold: 2;Step Height: 4";Other (comment)  In // bars                   Short Term Clinic Goals - 10/10/16 1612      CC Short Term Goal  #1   Title Pt. will be independent with beginning home exercise program for general strengthening and conditioning.   Status Partially Met     CC Short Term Goal  #2   Title Pt. will be able to walk at least 150 feet using a cane with close supervision.   Status On-going             Long Term Clinic Goals - 09/27/16 2028      CC Long Term Goal  #1   Title Pt. will be able to walk 500 feet using a single point cane independently to improve her mobility for return to work.   Time 8   Period Weeks   Status New   Target Date 12/08/16     CC Long Term Goal  #2   Title Pt. will report at least 50% greater ease with ascending stairs to get into the apartment where she lives.   Time 8   Period Weeks   Status New   Target Date 12/08/16     CC Long Term Goal  #3   Title Pt. will know a safe program of exercise to do at home or at the gym at her apartment complex.   Time 8   Period Weeks    Status New            Plan - 10/17/16 1101    Clinical Impression Statement Pt, though requiring seated rest breaks due to LE faitgue, tolerated session very well. She liked focusing on balance and LE strength together and reported had done some of these exercises she had learned at last visit in pool yesterday. Overall pt is pleased with her progress thus far and looks forward to more.   Rehab Potential Good   Clinical Impairments Affecting Rehab Potential bony and other metastases   PT Frequency 2x / week   PT Duration 8 weeks  PT Treatment/Interventions ADLs/Self Care Home Management;Moist Heat;Cryotherapy;Gait training;Stair training;Functional mobility training;Therapeutic exercise;Balance training;Neuromuscular re-education;Patient/family education;Manual techniques   PT Next Visit Plan continue with her singel point cane; try step training  continue walking in parallel bars forward, backward, braiding, sidestepping etc. Cont strengthening with functional activities as opposed to weight training due to bony mets; gait and balance training explore yoga poses/options  for home exercise    Consulted and Agree with Plan of Care Patient      Patient will benefit from skilled therapeutic intervention in order to improve the following deficits and impairments:  Abnormal gait, Decreased endurance, Decreased balance, Decreased strength  Visit Diagnosis: Muscle weakness (generalized)  Unsteadiness on feet  Difficulty in walking, not elsewhere classified  Weakness generalized  Activity intolerance     Problem List Patient Active Problem List   Diagnosis Date Noted  . AKI (acute kidney injury) ([place])   . Chest pain   . Hypercalcemia 09/05/2016  . ARF (acute renal failure) ([place]) 09/05/2016  . Bone metastases ([place]) 01/17/2016  . Genetic testing 10/15/2014  . Hypokalemia 06/17/2014  . Breast cancer metastasized to brain ([place])   . 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 Malnutrition of moderate degree ([place]) 04/13/2014  . Myelopathy ([place]) 04/12/2014  . Pathologic fracture of thoracic vertebrae 04/12/2014  . Biliary colic 10/62/6948  . Malignant neoplasm of upper-outer quadrant of right breast in female, estrogen receptor positive ([place]) 09/24/2013  . S/P total hysterectomy and bilateral salpingo-oophorectomy 12/23/2012    Otelia Limes, PTA 10/17/2016, 11:04 AM  [place], [place], [address] Phone: [phone]   Fax:  [phone]  Name: LORIANNA SPADACCINI MRN: [id number] Date of Birth: 1970/11/13

## 2016-10-17 NOTE — Progress Notes (Addendum)
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   Pain:Denies having pain. Fatigue:Having mild fatigue. Appetite:Has increased appetite weight is down 9 pounds since 09-2016 Nausea/Vomiting: Bowel/Bladder issues:No changes. Weight:120.2 lb 9 pound weight loss since September 2018 Wt Readings from Last 3 Encounters:  10/20/16 120 lb 12.8 oz (54.8 kg)  09/25/16 129 lb 4.8 oz (58.7 kg)  09/14/16 130 lb 11.2 oz (59.3 kg)  [place] having dizziness at times after lying down and if she sits up too fast. Imaging: 10-19-16 MRI brain Lab: 10-11-16 Cmet,CBC w diff September 05, 2016 was [facility] to [facility] for calcium deposits in her hips and lower back in hospital almost a week. BP 122/71   Pulse (!) 101   Temp 98 F (36.7 C) (Oral)   Resp 16   Ht '5\' 8"'  (1.727 m)   Wt 120 lb 3.2 oz (54.5 kg)   LMP 07/05/2011   SpO2 100%   BMI 18.28 kg/m Left arm sitting weight loss BP 121/70   Pulse (!) 104   Temp 98 F (36.7 C) (Oral)   Resp 16   Ht '5\' 8"'  (1.727 m)   Wt 120 lb 3.2 oz (54.5 kg)   LMP 07/05/2011   SpO2 100%   BMI 18.28 kg/m  Left arm standing weight loss

## 2016-10-18 ENCOUNTER — Other Ambulatory Visit: Payer: Self-pay | Admitting: Oncology

## 2016-10-18 NOTE — Progress Notes (Signed)
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 At the last visit here she was started on exemestane. Overall she seems to tolerate the treatment well. She states taking it in the morning with food, but notices later in the day feeling woozy. She also states feeling hoarse, but does not have a cough. She is able to obtain this medication at no cost.  She did not tolerate Aromasin well, and her liver function tests increased. Of course this could've been for a different reason and coincidental. In any case an ultrasound of the liver was obtained. This shows lesions most suggestive of hemangiomas. However a liver MRI was suggested for definitive clarification.  Since her last visit here also she had an MRI of the brain which shows continuing response to her treatment   REVIEW OF SYSTEMS: Carolyn [name] notes that she is going to rehab for her right side. She does not use a walker anymore, unless she needs it for safety. She notes mild SOB only with some exertion. She denies unusual headaches, visual changes, nausea, vomiting, or dizziness. There has been no unusual cough, phlegm production, or pleurisy. This been no change in bowel or bladder habits. She  denies unexplained fatigue or unexplained weight loss, bleeding, rash, or fever. A detailed review of systems was otherwise entirely negative.     BREAST CANCER HISTORY: From doctor Khan's of original intake node 05/31/2011:  "Carolyn [name] is a 46 y.o. female. Without significant past medical history. She underwent a mammogram that showed calcifications measuring 5 cm on the right breast. She then went on To have an ultrasound which showed the area to measure 2.9 cm. She was also found to have a right axillary lymph node that was suspicious for a malignancy. She had a biopsy of the calcifications that showed high-grade ductal carcinoma in situ. 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 In the lymph node biopsy there was no lymphatic tissue seen and was felt that the node was replaced by tumor. Patient went on to have an MRI of the bilateral breasts performed on evening of 05/30/2011. The MRI showed in the right breast 5 x 3 x 4.5 cm mass abutting the chest wall. About 7 cm away from this there was another mass in the upper inner quadrant that measured 1.5 x 1.7 x 1.5 cm. On the contralateral breast, that is the left breast a 2 cm area suspicious enhancement was noted also this up to date has not been biopsied and arrangements are being made for the biopsy to be performed. In this side there were no suspicious lymph nodes. The prognostic panel is pending. Patient is otherwise without any complaints. "  METASTATIC DISEASE: From the earlier summary note:  Carolyn [name] noted some strange feelings around her umbilicus 86/57/8469. This felt like an area of numbness.  Over the next 2 days she noted some leg weakness and difficulty walking, so she presented to the ED 04/12/2014. MRI of the thoraco-lumbar spine was obtained 04/12/2014 showing multiple bone lesions and compression fracture at T8 with retropulsion and cord compression. On 04/13/2014 she underwent Laminectomy T8  decompression of spinal cord posterior fixation from T6-T10 with pedicle screws and rods posterior arthrodesis with allograft. The pathology from this procedure ([id number]) showed metastatic adenocarcinoma which was estrogen receptor 69% positive, with moderate staining intensity, progesterone receptor negative. HER-2 could not be obtained.  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 There were nonspecific L temporal bone changes and also possible involvement of the clivus and calvarium, but no other parenchymal brain lesions. Further staging studies included a bone scan, which failed to show the lytic lesion seen on other scans, and CTs of the chest, abdomen and pelvis on 06/08/2011, which showed very small right lung and left liver lesions which will require follow-up  Her subsequent history is as detailed below   PAST MEDICAL HISTORY: Past Medical History:  Diagnosis Date  . Breast cancer ([place]) 09/2011   invasive ductal carcinoma metastatic ca in 3/14 lymph nodes  . History of chemotherapy 06/27/11 -09/04/11   neoadjuvant  . Hx of radiation therapy 11/08/11 -12/26/11   right chest wall/supraclav fossa, right scar  . S/P radiation therapy 05/08/14   SRS brain    PAST SURGICAL HISTORY: Past Surgical History:  Procedure Laterality Date  . brain surgery-LITT Left 11/20/14   LITT procedure for Lt brain met.  Marland Kitchen BREAST BIOPSY     Left  . CHOLECYSTECTOMY N/A 03/01/2014   Procedure: LAPAROSCOPIC CHOLECYSTECTOMY;  Surgeon: Leighton Ruff, MD;  Location: [facility];  Service: General;  Laterality: N/A;  . LAMINECTOMY N/A 04/13/2014   Procedure: THORACIC LAMINECTOMY WITH FIXATION THORACIC SIX-THORACIC TEN FUSION;  Surgeon: Kristeen Miss, MD;  Location: [facility];  Service: Neurosurgery;  Laterality: N/A;  . MODIFIED MASTECTOMY  10/03/2011   Procedure: MODIFIED MASTECTOMY;  Surgeon: Haywood Lasso, MD;  Location: [facility];  Service: General;  Laterality: Right;  . PORT-A-CATH REMOVAL  01/30/2012   Procedure: MINOR REMOVAL PORT-A-CATH;  Surgeon: Haywood Lasso, MD;  Location: [facility];  Service: General;  Laterality: Left;  . PORTACATH PLACEMENT  06/20/2011   Procedure: INSERTION PORT-A-CATH;  Surgeon: Haywood Lasso, MD;  Location: [facility];  Service: General;  Laterality: N/A;  . ROBOTIC ASSISTED TOTAL HYSTERECTOMY WITH BILATERAL SALPINGO OOPHERECTOMY Bilateral 12/23/2012   Procedure: ROBOTIC ASSISTED TOTAL HYSTERECTOMY WITH BILATERAL SALPINGO OOPHORECTOMY;  Surgeon: Marvene Staff, MD;  Location: [facility];  Service: Gynecology;  Laterality: Bilateral;  . WISDOM TOOTH EXTRACTION      FAMILY HISTORY Family History  Problem Relation Age of Onset  . Breast cancer Maternal Aunt 28  . Pancreatic cancer Maternal Grandfather   . Pancreatic cancer Maternal Aunt        died in her 34s  . Leukemia Maternal Aunt        died in her 5s  . Ovarian cancer Cousin 31       maternal cousin   the patient's father is alive, currently 87 years old. The patient's mother died from complications of diabetes at the age of 68. The patient had no brothers, 4 sisters. One sister has died from congestive heart failure. The patient's mother is  one of 5 sisters. One of them was diagnosed with breast cancer the age of 57. Also a cousin on the maternal side it was diagnosed with ovarian cancer. This was approximately age 32. There is also colon cancer in the family. The patient has had extensive genetic testing summarized below. She is however BRCA negative  GYNECOLOGIC HISTORY:  Patient's last menstrual period was 07/05/2011. Menarche age 72, she is GX P0. She underwent total hysterectomy with bilateral salpingo-oophorectomy December 2014.  SOCIAL HISTORY:  Carolyn [name] works at [employer] for [employer]. She mostly deals with government documents. She is single, lives by  herself, with no pets. She attends [employer] locally.    ADVANCED DIRECTIVES: Not in place. The patient has a documents and intends to name her sister Carolyn [name] as healthcare power of attorney. Carolyn [name] lives in [place] and can be reached at [place]: Social History  Substance Use Topics  . Smoking status: Never Smoker  . Smokeless tobacco: Never Used  . Alcohol use No     Colonoscopy:  PAP:  Bone density:  Lipid panel:  Allergies  Allergen Reactions  . Allegra [Fexofenadine] Hives    Abdomen only  . Cat Hair Extract Other (See Comments)  . Shellfish Allergy Hives    Abdomen only  . Glucosamine Rash    Current Outpatient Prescriptions  Medication Sig Dispense Refill  . Cholecalciferol (VITAMIN D3) 2000 units TABS Take by mouth daily.    Marland Kitchen dexamethasone (DECADRON) 0.5 MG/5ML solution Swish 78m in mouth for 2 min and spit out. Use 4 times daily for 8 weeks at start of Afinitor. 500 mL 3  . everolimus (AFINITOR) 10 MG tablet Take 1 tablet (10 mg total) by mouth daily. 30 tablet 6  . gabapentin (NEURONTIN) 100 MG capsule Take 1 capsule (100 mg total) by mouth at bedtime. Take 100-307mat night for neuropathy 90 capsule 0  . Multiple Vitamin (MULTIVITAMIN) tablet Take 1 tablet by mouth daily.    . ondansetron (ZOFRAN) 8 MG tablet Take 1 tablet (8 mg total) by mouth every 8 (eight) hours as needed for nausea or vomiting. 30 tablet 1  . polyethylene glycol (MIRALAX / GLYCOLAX) packet Take 17 g by mouth daily. 14 each 0  . traMADol (ULTRAM) 50 MG tablet Take 1 tablet (50 mg total) by mouth every 6 (six) hours as needed. 60 tablet 3  . exemestane (AROMASIN) 25 MG tablet Take 1 tablet (25 mg total) by mouth daily after breakfast. (Patient not taking: Reported on 10/20/2016) 30 tablet 0   No current facility-administered medications for this visit.     OBJECTIVE: Middle-aged AfSerbiamerican [name] Now using a cane  Vitals:   10/20/16 1134  BP:  128/70  Pulse: (!) 121  Resp: 18  Temp: (!) 97.5 F (36.4 C)  SpO2: 99%     Body mass index is 18.37 kg/m.    ECOG FS:1 - Symptomatic but completely ambulatory   Sclerae unicteric, pupils round and equal Oropharynx clear and moist No cervical or supraclavicular adenopathy Lungs no rales or rhonchi Heart regular rate and rhythm Abd soft, nontender, positive bowel sounds MSK no focal spinal tenderness, no upper extremity lymphedema Neuro: Continuing mild weakness on the right body, well oriented, appropriate affect Breasts: The right breast is status post mastectomy and radiation, with no evidence of local recurrence to the left breast is benign  LAB RESULTS:  CMP     Component Value Date/Time   NA 142  10/20/2016 1117   K 4.1 10/20/2016 1117   CL 105 09/13/2016 1043   CL 102 01/16/2012 0804   CO2 26 10/20/2016 1117   GLUCOSE 136 10/20/2016 1117   GLUCOSE 92 01/16/2012 0804   BUN 18.5 10/20/2016 1117   CREATININE 1.0 10/20/2016 1117   CALCIUM 10.1 10/20/2016 1117   PROT 8.1 10/20/2016 1117   ALBUMIN 3.6 10/20/2016 1117   AST 46 (H) 10/20/2016 1117   ALT 69 (H) 10/20/2016 1117   ALKPHOS 151 (H) 10/20/2016 1117   BILITOT 0.26 10/20/2016 1117   GFRNONAA 33 (L) 09/13/2016 1043   GFRAA 38 (L) 09/13/2016 1043    I No results found for: SPEP  Lab Results  Component Value Date   WBC 3.3 (L) 10/20/2016   NEUTROABS 2.0 10/20/2016   HGB 7.9 (L) 10/20/2016   HCT 24.8 (L) 10/20/2016   MCV 86.3 10/20/2016   PLT 70 (L) 10/20/2016      Chemistry      Component Value Date/Time   NA 142 10/20/2016 1117   K 4.1 10/20/2016 1117   CL 105 09/13/2016 1043   CL 102 01/16/2012 0804   CO2 26 10/20/2016 1117   BUN 18.5 10/20/2016 1117   CREATININE 1.0 10/20/2016 1117      Component Value Date/Time   CALCIUM 10.1 10/20/2016 1117   ALKPHOS 151 (H) 10/20/2016 1117   AST 46 (H) 10/20/2016 1117   ALT 69 (H) 10/20/2016 1117   BILITOT 0.26 10/20/2016 1117       Lab Results    Component Value Date   LABCA2 24 05/31/2011    No components found for: HRCBU384  No results for input(s): INR in the last 168 hours.  Urinalysis    Component Value Date/Time   COLORURINE STRAW (A) 09/05/2016 2000    STUDIES: Mr Jeri Cos TX Contrast  Result Date: 10/19/2016 CLINICAL DATA:  Follow-up metastatic breast cancer. Status post stereotactic radiosurgery to left parietal lesion 04/29/2014. Laser ablation November 2016. Right parietal metastasis status post [facility] December 2017. EXAM: MRI HEAD WITHOUT AND WITH CONTRAST TECHNIQUE: Multiplanar, multiecho pulse sequences of the brain and surrounding structures were obtained without and with intravenous contrast. CONTRAST:  36m MULTIHANCE GADOBENATE DIMEGLUMINE 529 MG/ML IV SOLN COMPARISON:  10 cc MultiHance intravenous FINDINGS: Brain: Size stable 13 mm left cerebral lesion along the left lateral ventricle. This lesion has been treated with radiotherapy and laser ablation. No associated edema. A right parietal cortically based metastasis has decreased in size, now 4 mm, and associated FLAIR hyperintensity is diminished. No mass effect. No new lesion. No infarct, hemorrhage, hydrocephalus, or shift Vascular: Major vessels are patent Skull and upper cervical spine: Known osseous metastatic disease with increased confluence of abnormal marrow hypointensity in the clivus. The visualized cervical spine marrow is progressively and diffusely hypointense and there is progressed patchy calvarial lesions, most notably at the occiput and in the left paramedian frontal bone. 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 Sinuses/Orbits: Negative for mass or inflammation. IMPRESSION: 1. Further decrease in size of treated right parietal metastasis. Continued stability of treated left periatrial lesion. No new intracranial metastasis. 2. Known osseous metastatic disease with progressive signal abnormality in the  skull of the cervical spine. Electronically Signed   By: JMonte FantasiaM.D.   On: 10/19/2016 14:34   UKoreaAbdomen Limited Ruq  Result Date: 10/12/2016 CLINICAL DATA:  Elevated liver enzymes EXAM: ULTRASOUND ABDOMEN LIMITED RIGHT UPPER QUADRANT COMPARISON:  December 16, 2014 FINDINGS: Gallbladder: Surgically absent. Common bile duct: Diameter: 8 mm which may be within normal limits for post cholecystectomy state. No biliary duct mass or calculus evident. Liver: There is a hyperechoic mass in the posterior segment of the right lobe of the liver measuring 1.7 x 1.1 x 1.1 cm. There is a second hyperechoic mass near the hepatorenal fossa measuring 0.8 x 0.6 x 0.7 cm. There is a cyst in the anterior segment right lobe of the liver measuring 0.7 x 0.5 x 0.9 cm. No other focal liver lesions are appreciable by ultrasound. Cough Within normal limits in parenchymal echogenicity. Portal vein is patent on color Doppler imaging with normal direction of blood flow towards the liver. IMPRESSION: Gallbladder is absent. Common bile duct measures 8 mm which may be within normal limits for post cholecystectomy state. No biliary duct mass or calculus is demonstrated on this study. There are small liver lesions. Two liver lesions are hyperechoic and may represent small hemangiomas. In review of the prior CT of the abdomen, multiple subcentimeter lesions were seen in the liver, not appreciable by ultrasound. Questions small MR Ephriam Jenkins is causing this appearance on prior CT. Note that the echogenic lesions in the liver currently have an appearance by ultrasound suggesting small hemangiomas, not confirmed on prior CT. This finding at a minimum warrants a follow-up ultrasound of the liver in 1 year to assess for stability. If more aggressive surveillance is felt to be warranted, MR or CT of the liver pre and serial post-contrast could be helpful to further assess. Electronically Signed   By: Lowella Grip III M.D.   On: 10/12/2016 13:14     ASSESSMENT: 46 y.o. BRCA negative Carolyn [name] status post right breast upper outer quadrant and right axillary lymph node biopsy 05/18/2011, both positive for an invasive ductal carcinoma, high-grade, clinicallyT2 N1-2 or stage IIB/IIIA,  estrogen receptor 100% positive, progesterone receptor 87% positive, with an MIB-1 of 14% and no HER-2 amplification ([id number]).  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   (a) VUS were also found in [place] and BARD1  (2) additional right breast biopsy upper inner quadrant 06/08/2011 showed only a fibroadenoma, and central left breast biopsy for another suspicious lesion showed only fibrocystic changes ([id number] and [id number]).   (3)Treated neoadjuvantly with cyclophosphamide and docetaxel x4 completed 08/29/2011.  (4) status post right modified radical mastectomy 10/03/2011 showing a residual pT1c pN1a (3/18 lymph nodes positive) invasive ductal carcinoma, grade 1,estrogen receptor 100% positive, progesterone receptor negative, with no HER-2 amplification ([id number])  (5) adjuvant radiation to the right chest wall, right supraclavicular fossa and right scar completed 12/26/2011  (6) tamoxifen started January 2014-- discontinued April 2016 with evidence of metastatic disease  (7) status post total hysterectomy with bilateral salpingo-oophorectomy 12/23/2012 with benign pathology ([id number])  METASTATIC DISEASE 04/13/2014 (8) presented with T8 cord compression and underwent laminectomy 04/13/2014 with T8 decompression of spinal cord, posterior fixation from T6-T10 with pedicle screws and rods, posterior arthrodesis with allograft. 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 ExacTrac Snap verification was performed for each couch angle.   (b) radiation to the T-spine completed 05/08/2014.  (c) "Auto-LITT" procedure performed at [facility] on 11/20/14  (d) SRS to 0.4 cm left parietal lesion 12/23/2015  (11) started fulvestrant 05/18/2014 and Palbociclib 06/01/2014  (a) Palbociclib dose decreased to 100 mg daily, 21/7 as of 08/05/2014  (b) palbociclib dose decreased to 75 mg daily, 21/7, starting May 2017  (c) palbociclib dose decreased to 75 mg every other day beginning 07/26/2015    (d) palbociclib held for month of October because of persistent low counts, resumed November  (e) fulvestrant discontinued after 11/02/2016 dose: Anastrozole started 08/01/2016    (12) started zolendronate 05/18/2014, repeated every 12 weeks  (a) July zolendronate dose held because of dental issues, resumed 10/18/2015  (b) switched to denosumab as of 01/20/2016 with concern of bony progression on zolendronate  (c) switching back to zolendronate as of April 2018 at patient's request  (d) switched to denosumab/Xgeva because of hypercalcemia, first dose 09/13/2016  (13) most recent staging studies:  (a) brain MRI 07/13/2016 shows stable to smaller treated lesions.  (b) bone scan 04/06/2016 shows no new lesions  (c) chest CT scan 04/06/2016 shows small bilateral pulmonary nodules with no evidence of disease progression  (d) MRI of the thoracolumbar spine shows diffuse bony metastases particularly involving T8, T11 and T12, but without epidural disease. There were subcentimeter pulmonary nodules and indeterminate liver lesions. CT scan of the left hip 09/06/2016 showed multiple bony metastases but no fracture  (14)  anastrozole resumed 10/20/2016  (a) everolimus started 09/25/2016  (15) palliative radiation to the right hip completed 09/26/2016    PLAN: Dorri is now over 2 years out from definitive surgery for her metastatic disease. The disease in the brain is well-controlled. She will need a repeat MRI in perhaps 3 months.  The peripheral disease is now being treated with anastrozole plus everolimus. She was not able to tolerate the exemestane.  The plan is to continue these drugs as tolerated and restaged after 3-4 months.  She was cautioned regarding pneumonitis and mucositis symptoms and if any of these develop she will let us know immediately.  What I am seeing in her ultrasound really does appear benign particular when compared with her prior CT scans of the chest which do include the liver to some extent. I think it would be best however to obtain a liver MRI to clear the air and we have set that up for shortly before her November 28 visit.  We are continuing the Xgeva as before  Carolyn [name] is benefiting greatly from physical therapy. She knows to call for any problems that may develop before her next visit.    Magrinat, Virgie Dad, MD  10/20/16 11:54 AM Medical Oncology and Hematology [facility] [address] [place], [place] [address] Tel. [phone]    Fax. [phone]  This document serves as a record of services personally performed by Chauncey Cruel, MD. It was created on her behalf by Margit Banda, a trained medical scribe. The creation of this record is based on the scribe's personal observations and the provider's statements to them. This document has been checked and approved by the attending provider.

## 2016-10-19 ENCOUNTER — Other Ambulatory Visit: Payer: Self-pay

## 2016-10-19 ENCOUNTER — Ambulatory Visit
Admission: RE | Admit: 2016-10-19 | Discharge: 2016-10-19 | Disposition: A | Discharge status: Home / Self Care | Payer: BC Managed Care – PPO | Source: Ambulatory Visit | Attending: Radiation Oncology | Admitting: Radiation Oncology

## 2016-10-19 DIAGNOSIS — C50911 Malignant neoplasm of unspecified site of right female breast: Secondary | ICD-10-CM

## 2016-10-19 DIAGNOSIS — C7931 Secondary malignant neoplasm of brain: Principal | ICD-10-CM

## 2016-10-19 DIAGNOSIS — C7951 Secondary malignant neoplasm of bone: Secondary | ICD-10-CM

## 2016-10-19 MED ORDER — GADOBENATE DIMEGLUMINE 529 MG/ML IV SOLN
10.0000 mL | Freq: Once | INTRAVENOUS | Status: AC | PRN
  Administered 2016-10-19: 10 mL via INTRAVENOUS

## 2016-10-20 ENCOUNTER — Ambulatory Visit: Payer: BC Managed Care – PPO | Admitting: Physical Therapy

## 2016-10-20 ENCOUNTER — Telehealth: Payer: Self-pay | Admitting: Oncology

## 2016-10-20 ENCOUNTER — Other Ambulatory Visit (HOSPITAL_BASED_OUTPATIENT_CLINIC_OR_DEPARTMENT_OTHER): Payer: BC Managed Care – PPO

## 2016-10-20 ENCOUNTER — Ambulatory Visit (HOSPITAL_BASED_OUTPATIENT_CLINIC_OR_DEPARTMENT_OTHER): Payer: BC Managed Care – PPO | Admitting: Oncology

## 2016-10-20 VITALS — BP 128/70 | HR 121 | Temp 97.5°F | Resp 18 | Ht 68.0 in | Wt 120.8 lb

## 2016-10-20 DIAGNOSIS — R918 Other nonspecific abnormal finding of lung field: Secondary | ICD-10-CM | POA: Diagnosis not present

## 2016-10-20 DIAGNOSIS — R2681 Unsteadiness on feet: Secondary | ICD-10-CM

## 2016-10-20 DIAGNOSIS — M6281 Muscle weakness (generalized): Secondary | ICD-10-CM | POA: Diagnosis not present

## 2016-10-20 DIAGNOSIS — C50411 Malignant neoplasm of upper-outer quadrant of right female breast: Secondary | ICD-10-CM

## 2016-10-20 DIAGNOSIS — C7951 Secondary malignant neoplasm of bone: Secondary | ICD-10-CM | POA: Diagnosis not present

## 2016-10-20 DIAGNOSIS — C7931 Secondary malignant neoplasm of brain: Secondary | ICD-10-CM | POA: Diagnosis not present

## 2016-10-20 DIAGNOSIS — R262 Difficulty in walking, not elsewhere classified: Secondary | ICD-10-CM

## 2016-10-20 DIAGNOSIS — Z170 Estrogen receptor positive status [ER+]: Secondary | ICD-10-CM | POA: Diagnosis not present

## 2016-10-20 DIAGNOSIS — C50911 Malignant neoplasm of unspecified site of right female breast: Secondary | ICD-10-CM

## 2016-10-20 DIAGNOSIS — K769 Liver disease, unspecified: Secondary | ICD-10-CM | POA: Diagnosis not present

## 2016-10-20 DIAGNOSIS — C773 Secondary and unspecified malignant neoplasm of axilla and upper limb lymph nodes: Secondary | ICD-10-CM | POA: Diagnosis not present

## 2016-10-20 LAB — CBC WITH DIFFERENTIAL/PLATELET
BASO%: 0.3 % (ref 0.0–2.0)
BASOS ABS: 0 10*3/uL (ref 0.0–0.1)
EOS ABS: 0 10*3/uL (ref 0.0–0.5)
EOS%: 0.7 % (ref 0.0–7.0)
HCT: 24.8 % — ABNORMAL LOW (ref 34.8–46.6)
HGB: 7.9 g/dL — ABNORMAL LOW (ref 11.6–15.9)
LYMPH%: 29.8 % (ref 14.0–49.7)
MCH: 27.6 pg (ref 25.1–34.0)
MCHC: 32 g/dL (ref 31.5–36.0)
MCV: 86.3 fL (ref 79.5–101.0)
MONO#: 0.3 10*3/uL (ref 0.1–0.9)
MONO%: 9.1 % (ref 0.0–14.0)
NEUT%: 60.1 % (ref 38.4–76.8)
NEUTROS ABS: 2 10*3/uL (ref 1.5–6.5)
PLATELETS: 70 10*3/uL — AB (ref 145–400)
RBC: 2.88 10*6/uL — AB (ref 3.70–5.45)
RDW: 18.2 % — ABNORMAL HIGH (ref 11.2–14.5)
WBC: 3.3 10*3/uL — AB (ref 3.9–10.3)
lymph#: 1 10*3/uL (ref 0.9–3.3)

## 2016-10-20 LAB — COMPREHENSIVE METABOLIC PANEL
ALT: 69 U/L — ABNORMAL HIGH (ref 0–55)
ANION GAP: 12 meq/L — AB (ref 3–11)
AST: 46 U/L — ABNORMAL HIGH (ref 5–34)
Albumin: 3.6 g/dL (ref 3.5–5.0)
Alkaline Phosphatase: 151 U/L — ABNORMAL HIGH (ref 40–150)
BILIRUBIN TOTAL: 0.26 mg/dL (ref 0.20–1.20)
BUN: 18.5 mg/dL (ref 7.0–26.0)
CO2: 26 meq/L (ref 22–29)
Calcium: 10.1 mg/dL (ref 8.4–10.4)
Chloride: 104 mEq/L (ref 98–109)
Creatinine: 1 mg/dL (ref 0.6–1.1)
GLUCOSE: 136 mg/dL (ref 70–140)
POTASSIUM: 4.1 meq/L (ref 3.5–5.1)
SODIUM: 142 meq/L (ref 136–145)
Total Protein: 8.1 g/dL (ref 6.4–8.3)

## 2016-10-20 MED ORDER — ANASTROZOLE 1 MG PO TABS: 1.0000 mg | ORAL_TABLET | Freq: Every day | ORAL | 4 refills | Status: DC

## 2016-10-20 NOTE — Telephone Encounter (Signed)
Gave patient avs and calendar per 10/12 los.

## 2016-10-20 NOTE — Therapy (Signed)
[place], [place], [address] Phone: [phone]   Fax:  [phone]  Physical Therapy Treatment  Patient Details  Name: Carolyn [name] MRN: [id number] Date of Birth: 1970-05-17 Referring Provider: Dr. Gunnar Bulla Magrinat  Encounter Date: 10/20/2016      PT End of Session - 10/20/16 1033    Visit Number 7   Number of Visits 17   Date for PT Re-Evaluation 12/08/16   PT Start Time 0933   PT Stop Time 1016   PT Time Calculation (min) 43 min   Activity Tolerance Patient tolerated treatment well   Behavior During Therapy [facility] for tasks assessed/performed      Past Medical History:  Diagnosis Date  . 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 History of chemotherapy 06/27/11 -09/04/11   neoadjuvant  . Hx of radiation therapy 11/08/11 -12/26/11   right chest wall/supraclav fossa, right scar  . S/P radiation therapy 05/08/14   SRS brain    Past Surgical History:  Procedure Laterality Date  . brain surgery-LITT Left 11/20/14   LITT procedure for Lt brain met.  Marland Kitchen BREAST BIOPSY     Left  . CHOLECYSTECTOMY N/A 03/01/2014   Procedure: LAPAROSCOPIC CHOLECYSTECTOMY;  Surgeon: Leighton Ruff, MD;  [facility]: [facility];  Service: General;  Laterality: N/A;  . LAMINECTOMY N/A 04/13/2014   Procedure: THORACIC LAMINECTOMY WITH FIXATION THORACIC SIX-THORACIC TEN FUSION;  Surgeon: Kristeen Miss, MD;  Location: [place] [facility];  Service: Neurosurgery;  Laterality: N/A;  . MODIFIED MASTECTOMY  10/03/2011   Procedure: MODIFIED MASTECTOMY;  Surgeon: Haywood Lasso, MD;  Location: [place];  Service: General;  Laterality: Right;  . PORT-A-CATH REMOVAL  01/30/2012   Procedure: MINOR REMOVAL PORT-A-CATH;  Surgeon: Haywood Lasso, MD;  Location: [place];  Service: General;  Laterality: Left;  . PORTACATH PLACEMENT  06/20/2011   Procedure: INSERTION  PORT-A-CATH;  Surgeon: Haywood Lasso, MD;  Location: [place];  Service: General;  Laterality: N/A;  . ROBOTIC ASSISTED TOTAL HYSTERECTOMY WITH BILATERAL SALPINGO OOPHERECTOMY Bilateral 12/23/2012   Procedure: ROBOTIC ASSISTED TOTAL HYSTERECTOMY WITH BILATERAL SALPINGO OOPHORECTOMY;  Surgeon: Marvene Staff, MD;  Location: [facility];  Service: Gynecology;  Laterality: Bilateral;  . WISDOM TOOTH EXTRACTION      There were no vitals filed for this visit.      Subjective Assessment - 10/20/16 0936    Subjective "Going to appointments back and forth.  I have an appointment after this to do bloodwork and see the doctor about my new medicine."  The exercises in the water were fun.    Pertinent History BRCA negative [place] woman status post right breast upper outer quadrant and right axillary lymph node biopsy 05/18/2011, both positive for an invasive ductal carcinoma, high-grade, clinicallyT2 N1-2 or stage IIB/IIIA,. status post right modified radical mastectomy 10/03/2011. presented with T8 cord compression and underwent laminectomy 04/13/2014 with T8 decompression of spinal cord, posterior fixation from T6-T10 with pedicle screws and rods. Had LITT brain surgery at [facility] in 2016 to remove a tumor.Has had XRT to brain and thoracic spine. MRI of the thoracolumbar spine shows diffuse bony metastases particularly involving T8, T11 and T12, but without epidural disease. There were subcentimeter pulmonary nodules and indeterminate liver lesions. CT scan of the left hip 09/06/2016 showed multiple bony metastases but no fracture.  Recently had 2 weeks of XRT to left hip. (Most of this  history from Dr. Virgie Dad recent note.)   Currently in Pain? No/denies                         [facility] Adult PT Treatment/Exercise - 10/20/16 0001      Ambulation/Gait   Gait Comments Pt. comes in today using her single point cane and does not have her walker. Says she has mainly been using the cane and  has not had any falls or near falls.     Balance Poses: Yoga   Warrior I 2 reps  with Rt. foot forward, 2 with Lt.   Tree Pose 2 reps  10 sec with modified "kickstand"     Neuro Re-ed    Neuro Re-ed Details  On BOSU ball in between treadmill handrails for UE support:  try to balance with one and then no UE support; with one UE support, shift weight side to side, then step in place; with hands on front support, mini squats.     Knee/Hip Exercises: Aerobic   Recumbent Bike level #1 x 3 minutes  SpO2 93%, HR 128 at end of this     Knee/Hip Exercises: Standing   Other Standing Knee Exercises mini squats with one UE support on bike seat back x 10  little tired   Other Standing Knee Exercises knee flexion and extension control exercises vs. yellow Theraband x 15 each, on right side                   Short Term Clinic Goals - 10/20/16 1251      CC Short Term Goal  #1   Title Pt. will be independent with beginning home exercise program for general strengthening and conditioning.   Status Partially Met     CC Short Term Goal  #2   Title Pt. will be able to walk at least 150 feet using a cane with close supervision.   Status Achieved             Long Term Clinic Goals - 09/27/16 2028      CC Long Term Goal  #1   Title Pt. will be able to walk 500 feet using a single point cane independently to improve her mobility for return to work.   Time 8   Period Weeks   Status New   Target Date 12/08/16     CC Long Term Goal  #2   Title Pt. will report at least 50% greater ease with ascending stairs to get into the apartment where she lives.   Time 8   Period Weeks   Status New   Target Date 12/08/16     CC Long Term Goal  #3   Title Pt. will know a safe program of exercise to do at home or at the gym at her apartment complex.   Time 8   Period Weeks   Status New            Plan - 10/20/16 1034    Clinical Impression Statement Pt. was again challenged by  exercise today.  She could only tolerate 3 minutes of pedaling the recumbent bike.  She did a number of balance and LE strengthening exercises, and required rest breaks after every few. She is now walking mainly with her cane, and has surpassed her short term goal related to that.   Rehab Potential Good   Clinical Impairments Affecting Rehab Potential bony and other metastases   PT  Frequency 2x / week   PT Duration 8 weeks   PT Treatment/Interventions ADLs/Self Care Home Management;Moist Heat;Cryotherapy;Gait training;Stair training;Functional mobility training;Therapeutic exercise;Balance training;Neuromuscular re-education;Patient/family education;Manual techniques   PT Next Visit Plan continue with her single point cane; try step training; continue walking in parallel bars forward, backward, braiding, sidestepping etc. Cont strengthening with functional activities as opposed to weight training due to bony mets; gait and balance training explore yoga poses/options  for home exercise    Consulted and Agree with Plan of Care Patient      Patient will benefit from skilled therapeutic intervention in order to improve the following deficits and impairments:  Abnormal gait, Decreased endurance, Decreased balance, Decreased strength  Visit Diagnosis: Muscle weakness (generalized)  Unsteadiness on feet  Difficulty in walking, not elsewhere classified     Problem List Patient Active Problem List   Diagnosis Date Noted  . AKI (acute kidney injury) ([place])   . Chest pain   . Hypercalcemia 09/05/2016  . ARF (acute renal failure) ([place]) 09/05/2016  . Bone metastases ([place]) 01/17/2016  . Genetic testing 10/15/2014  . Hypokalemia 06/17/2014  . Breast cancer metastasized to brain ([place])   . 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 Malnutrition of moderate degree ([place]) 04/13/2014  . Myelopathy ([place]) 04/12/2014  . Pathologic fracture of thoracic vertebrae 04/12/2014  . Biliary colic 09/81/1914   . Malignant neoplasm of upper-outer quadrant of right breast in female, estrogen receptor positive ([place]) 09/24/2013  . S/P total hysterectomy and bilateral salpingo-oophorectomy 12/23/2012    Shahira Fiske 10/20/2016, 12:54 PM  [place], [place], [address] Phone: [phone]   Fax:  [phone]  Name: GRACIA SAGGESE MRN: [id number] Date of Birth: 05-08-1970  Serafina Royals, PT 10/20/16 12:54 PM

## 2016-10-23 ENCOUNTER — Encounter: Payer: Self-pay | Admitting: Radiation Oncology

## 2016-10-23 ENCOUNTER — Ambulatory Visit
Admission: RE | Admit: 2016-10-23 | Discharge: 2016-10-23 | Disposition: A | Discharge status: Home / Self Care | Payer: BC Managed Care – PPO | Source: Ambulatory Visit | Attending: Radiation Oncology | Admitting: Radiation Oncology

## 2016-10-23 VITALS — BP 121/70 | HR 104 | Temp 98.0°F | Resp 16 | Ht 68.0 in | Wt 120.2 lb

## 2016-10-23 DIAGNOSIS — C7931 Secondary malignant neoplasm of brain: Secondary | ICD-10-CM | POA: Insufficient documentation

## 2016-10-23 DIAGNOSIS — C50911 Malignant neoplasm of unspecified site of right female breast: Secondary | ICD-10-CM | POA: Diagnosis not present

## 2016-10-23 DIAGNOSIS — C50411 Malignant neoplasm of upper-outer quadrant of right female breast: Secondary | ICD-10-CM

## 2016-10-23 DIAGNOSIS — Z170 Estrogen receptor positive status [ER+]: Secondary | ICD-10-CM

## 2016-10-23 NOTE — Progress Notes (Signed)
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 The L-5 spine was treated to 30 Gy in 10 fraction at 3 Gy per fraction. 2. The Left pelvis was treated to 30 Gy in 10 fraction at 3 Gy per fraction.  12/23/2015 SRS Treatment:  PTV2 Right parietal was treated to 20 Gy in 1 fraction.  04/29/2014 SRS Treatment:  PTV1 Left parietal lesion to a dose of 20 gray in 1 fraction  04/27/14-05/08/14:  Palliative radiotherapy to the thoracic spine 30 Gy in 10 fractions  11/08/2011-12/26/2011: Right chest wall / 50.4 Gray @ 1.8 Pearline Cables per fraction x 28 fractions Right Supraclavicular fossa / 45 Gray '@1' .8 Gray per fraction x 25 fractions Right scar / 10 Gray at [employer] per fraction x 5 fractions  Narrative:  The patient returns today for routine follow-up. This is a young patient who was diagnosed with metastatic right breast cancer to the brain who underwent stereotactic radiosurgery to the brain lesion in 2016. She continued to have edema and subsequently was referred to Dr. Salomon Fick at [facility]. She underwent LITT on 11/20/14.  She proceeded with another course of SRS in December 2017. She does have additional disease in the thoracic spine but has not required radiotherapy to this site since 2016 . She underwent treatment to the L5 vertebral body and left pelvis/hip which she completed last month. She had an MRI of the brain on 10/19/16 which revealed stability of her previously treated disease a no new brain disease. She comes today to review this.  On review of systems, the patient reports that  she is doing well overall. She reports her lumbar and hip pain has resolved since completing treatment last month. She denies any chest pain, shortness of breath, cough, fevers, chills, night sweats, unintended weight changes. She denies any bowel or bladder disturbances, and denies abdominal pain, nausea or vomiting. She denies any new musculoskeletal or joint aches or pains, new skin lesions or concerns. A complete review of systems is obtained and is otherwise negative.   Past Medical History:  Past Medical History:  Diagnosis Date  . 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 History of chemotherapy 06/27/11 -09/04/11   neoadjuvant  . Hx of radiation therapy 11/08/11 -12/26/11   right chest wall/supraclav fossa, right scar  . S/P radiation therapy 05/08/14   SRS brain    Past Surgical History: Past Surgical History:  Procedure Laterality Date  . brain surgery-LITT Left 11/20/14   LITT procedure for Lt brain met.  Marland Kitchen BREAST BIOPSY     Left  . CHOLECYSTECTOMY N/A 03/01/2014   Procedure: LAPAROSCOPIC CHOLECYSTECTOMY;  Surgeon: Leighton Ruff, MD;  [facility];  Service: General;  Laterality: N/A;  . LAMINECTOMY N/A 04/13/2014   Procedure: THORACIC LAMINECTOMY WITH FIXATION THORACIC SIX-THORACIC TEN FUSION;  Surgeon: Kristeen Miss, MD;  Location: [facility];  Service: Neurosurgery;  Laterality: N/A;  . MODIFIED MASTECTOMY  10/03/2011   Procedure: MODIFIED MASTECTOMY;  Surgeon: Haywood Lasso, MD;  Location: [place];  Service: General;  Laterality: Right;  . PORT-A-CATH REMOVAL  01/30/2012   Procedure: MINOR REMOVAL PORT-A-CATH;  Surgeon: Haywood Lasso, MD;  Location: [facility];  Service: General;  Laterality: Left;  . PORTACATH PLACEMENT  06/20/2011   Procedure: INSERTION PORT-A-CATH;  Surgeon: Haywood Lasso, MD;  Location: [facility];  Service: General;  Laterality: N/A;  . ROBOTIC ASSISTED TOTAL  HYSTERECTOMY WITH BILATERAL SALPINGO OOPHERECTOMY Bilateral 12/23/2012   Procedure: ROBOTIC ASSISTED TOTAL HYSTERECTOMY WITH BILATERAL SALPINGO OOPHORECTOMY;  Surgeon: Marvene Staff, MD;  Location: [facility];  Service: Gynecology;  Laterality: Bilateral;  . WISDOM TOOTH EXTRACTION      Social History:  Social History   Social History  . Marital status: Single    Spouse name: N/A  . Number of children: N/A  . Years of education: N/A   Occupational History  . Not on file.   Social History Main Topics  . Smoking status: Never Smoker  . Smokeless tobacco: Never Used  . Alcohol use No  . Drug use: No  . Sexual activity: Not Currently    Birth control/ protection: Condom   Other Topics Concern  . Not on file   Social History Narrative  . No narrative on file    Family History: Family History  Problem Relation Age of Onset  . Breast cancer Maternal Aunt 1  . Pancreatic cancer Maternal Grandfather   . Pancreatic cancer Maternal Aunt        died in her 39s  . Leukemia Maternal Aunt        died in her 32s  . Ovarian cancer Cousin 23       maternal cousin                               ALLERGIES:  is allergic to allegra [fexofenadine]; cat hair extract; shellfish allergy; and glucosamine.  Meds: Current Outpatient Prescriptions  Medication Sig Dispense Refill  . Cholecalciferol (VITAMIN D3) 2000 units TABS Take by mouth daily.    Marland Kitchen everolimus (AFINITOR) 10 MG tablet Take 1 tablet (10 mg total) by mouth daily. 30 tablet 6  . gabapentin (NEURONTIN) 100 MG capsule Take 1 capsule (100 mg total) by mouth at bedtime. Take 100-347m at night for neuropathy 90 capsule 0  . Multiple Vitamin (MULTIVITAMIN) tablet Take 1 tablet by mouth daily.    . ondansetron (ZOFRAN) 8 MG tablet Take 1 tablet (8 mg total) by mouth every 8 (eight) hours as needed for nausea or vomiting. 30 tablet 1  . polyethylene glycol (MIRALAX / GLYCOLAX) packet Take 17 g by mouth daily. 14 each 0  .  traMADol (ULTRAM) 50 MG tablet Take 1 tablet (50 mg total) by mouth every 6 (six) hours as needed. 60 tablet 3  . anastrozole (ARIMIDEX) 1 MG tablet Take 1 tablet (1 mg total) by mouth daily. (Patient not taking: Reported on 10/23/2016) 90 tablet 4  . dexamethasone (DECADRON) 0.5 MG/5ML solution Swish 172min mouth for 2 min and spit out. Use 4 times daily for 8 weeks at start of Afinitor. (Patient not taking: Reported on 10/23/2016) 500 mL 3   No current facility-administered medications for this encounter.     Physical Findings:  height is '5\' 8"'  (1.727 m) and weight is 120 lb 3.2 oz (54.5 kg). Her oral temperature is 98 F (36.7 C). Her blood pressure is 121/70 and her pulse is 104 (abnormal). Her respiration  is 16 and oxygen saturation is 100%.   Pain Assessment Pain Score:  (Left arm standing weight loss 9 pounds)  0/10 pain  In general this is a thin, appearing African-American female in no acute distress. She's alert and oriented x4 and appropriate throughout the examination. Cardiopulmonary assessment is negative for acute distress and she exhibits normal effort. She appears to be neurologically intact grossly without focal deficits.    Lab Findings: Lab Results  Component Value Date   WBC 3.3 (L) 10/20/2016   HGB 7.9 (L) 10/20/2016   HCT 24.8 (L) 10/20/2016   MCV 86.3 10/20/2016   PLT 70 (L) 10/20/2016     Radiographic Findings: Mr Jeri Cos GE Contrast  Result Date: 10/19/2016 CLINICAL DATA:  Follow-up metastatic breast cancer. Status post stereotactic radiosurgery to left parietal lesion 04/29/2014. Laser ablation November 2016. Right parietal metastasis status post [facility] December 2017. EXAM: MRI HEAD WITHOUT AND WITH CONTRAST TECHNIQUE: Multiplanar, multiecho pulse sequences of the brain and surrounding structures were obtained without and with intravenous contrast. CONTRAST:  23m MULTIHANCE GADOBENATE DIMEGLUMINE 529 MG/ML IV SOLN COMPARISON:  10 cc MultiHance intravenous  FINDINGS: Brain: Size stable 13 mm left cerebral lesion along the left lateral ventricle. This lesion has been treated with radiotherapy and laser ablation. No associated edema. A right parietal cortically based metastasis has decreased in size, now 4 mm, and associated FLAIR hyperintensity is diminished. No mass effect. No new lesion. No infarct, hemorrhage, hydrocephalus, or shift Vascular: Major vessels are patent Skull and upper cervical spine: Known osseous metastatic disease with increased confluence of abnormal marrow hypointensity in the clivus. The visualized cervical spine marrow is progressively and diffusely hypointense and there is progressed patchy calvarial lesions, most notably at the occiput and in the left paramedian frontal bone. 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 Sinuses/Orbits: Negative for mass or inflammation. IMPRESSION: 1. Further decrease in size of treated right parietal metastasis. Continued stability of treated left periatrial lesion. No new intracranial metastasis. 2. Known osseous metastatic disease with progressive signal abnormality in the skull of the cervical spine. Electronically Signed   By: JMonte FantasiaM.D.   On: 10/19/2016 14:34   [place]domen Limited Ruq  Result Date: 10/12/2016 CLINICAL DATA:  Elevated liver enzymes EXAM: ULTRASOUND ABDOMEN LIMITED RIGHT UPPER QUADRANT COMPARISON:  December 16, 2014 FINDINGS: Gallbladder: Surgically absent. Common bile duct: Diameter: 8 mm which may be within normal limits for post cholecystectomy state. No biliary duct mass or calculus evident. Liver: There is a hyperechoic mass in the posterior segment of the right lobe of the liver measuring 1.7 x 1.1 x 1.1 cm. There is a second hyperechoic mass near the hepatorenal fossa measuring 0.8 x 0.6 x 0.7 cm. There is a cyst in the anterior segment right lobe of the liver measuring 0.7 x 0.5 x 0.9 cm. No other focal liver lesions are  appreciable by ultrasound. Cough Within normal limits in parenchymal echogenicity. Portal vein is patent on color Doppler imaging with normal direction of blood flow towards the liver. IMPRESSION: Gallbladder is absent. Common bile duct measures 8 mm which may be within normal limits for post cholecystectomy state. No biliary duct mass or calculus is demonstrated on this study. There are small liver lesions. Two liver lesions are hyperechoic and may represent small hemangiomas. In review of the prior CT of the abdomen, multiple subcentimeter lesions were seen in the liver, not appreciable by ultrasound. Questions small MR TEphriam Jenkins  is causing this appearance on prior CT. Note that the echogenic lesions in the liver currently have an appearance by ultrasound suggesting small hemangiomas, not confirmed on prior CT. This finding at a minimum warrants a follow-up ultrasound of the liver in 1 year to assess for stability. If more aggressive surveillance is felt to be warranted, MR or CT of the liver pre and serial post-contrast could be helpful to further assess. Electronically Signed   By: Lowella Grip III M.D.   On: 10/12/2016 13:14    Impression/Plan: 1. 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 Carolyn [name] is doing well radiographically within the brain. We discussed her MRI results and the recommendations for [place] to proceed with repeat MRI in 3 month's time. She is in agreement with this plan. She will continue with Dr. [name] on anastrozole and everolimus. She continues to have Thoracic spine disease as well but is currently asymptomatic and no evidence of epidural compromise is noted so we will follow this expectantly.    Carola Rhine, PAC

## 2016-10-23 NOTE — Addendum Note (Signed)
Encounter addended by: Malena Edman, RN on: 10/23/2016  4:48 PM<BR>    Actions taken: Charge Capture section accepted

## 2016-10-24 ENCOUNTER — Ambulatory Visit: Payer: BC Managed Care – PPO | Admitting: Physical Therapy

## 2016-10-24 DIAGNOSIS — R2681 Unsteadiness on feet: Secondary | ICD-10-CM

## 2016-10-24 DIAGNOSIS — M6281 Muscle weakness (generalized): Secondary | ICD-10-CM | POA: Diagnosis not present

## 2016-10-24 DIAGNOSIS — R262 Difficulty in walking, not elsewhere classified: Secondary | ICD-10-CM

## 2016-10-24 DIAGNOSIS — R531 Weakness: Secondary | ICD-10-CM

## 2016-10-24 MED FILL — ANASTROZOLE 1 MG TABLET: 1 | 90 days supply | Qty: 90 | Fill #0

## 2016-10-24 NOTE — Therapy (Signed)
[facility], [place], [address] Phone: [phone]   Fax:  [phone]  Physical Therapy Treatment  Patient Details  Name: Carolyn [name] MRN: [id number] Date of Birth: 06-12-1970 Referring Provider: Dr. Gunnar Bulla Magrinat  Encounter Date: 10/24/2016      PT End of Session - 10/24/16 1646    Visit Number 8   Number of Visits 17   Date for PT Re-Evaluation 12/08/16   PT Start Time 5993   PT Stop Time 1345   PT Time Calculation (min) 46 min   Activity Tolerance Patient tolerated treatment well   Behavior During Therapy [facility] for tasks assessed/performed      Past Medical History:  Diagnosis Date  . 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 History of chemotherapy 06/27/11 -09/04/11   neoadjuvant  . Hx of radiation therapy 11/08/11 -12/26/11   right chest wall/supraclav fossa, right scar  . S/P radiation therapy 05/08/14   SRS brain    Past Surgical History:  Procedure Laterality Date  . brain surgery-LITT Left 11/20/14   LITT procedure for Lt brain met.  Marland Kitchen BREAST BIOPSY     Left  . CHOLECYSTECTOMY N/A 03/01/2014   Procedure: LAPAROSCOPIC CHOLECYSTECTOMY;  Surgeon: Leighton Ruff, MD;  [facility];  Service: General;  Laterality: N/A;  . LAMINECTOMY N/A 04/13/2014   Procedure: THORACIC LAMINECTOMY WITH FIXATION THORACIC SIX-THORACIC TEN FUSION;  Surgeon: Kristeen Miss, MD;  Location: [facility];  Service: Neurosurgery;  Laterality: N/A;  . MODIFIED MASTECTOMY  10/03/2011   Procedure: MODIFIED MASTECTOMY;  Surgeon: Haywood Lasso, MD;  Location: [facility];  Service: General;  Laterality: Right;  . PORT-A-CATH REMOVAL  01/30/2012   Procedure: MINOR REMOVAL PORT-A-CATH;  Surgeon: Haywood Lasso, MD;  Location: [facility];  Service: General;  Laterality: Left;  . PORTACATH PLACEMENT  06/20/2011   Procedure: INSERTION  PORT-A-CATH;  Surgeon: Haywood Lasso, MD;  Location: [facility];  Service: General;  Laterality: N/A;  . ROBOTIC ASSISTED TOTAL HYSTERECTOMY WITH BILATERAL SALPINGO OOPHERECTOMY Bilateral 12/23/2012   Procedure: ROBOTIC ASSISTED TOTAL HYSTERECTOMY WITH BILATERAL SALPINGO OOPHORECTOMY;  Surgeon: Marvene Staff, MD;  Location: [facility];  Service: Gynecology;  Laterality: Bilateral;  . WISDOM TOOTH EXTRACTION      There were no vitals filed for this visit.      Subjective Assessment - 10/24/16 1302    Subjective I was wondering about going back to work.  My doctor said whenever I want to, but I don't want to rush it.  I'm still a little wobbly.  I have FMLA time left. Says she has been doing some walking and having to go up and down steps to clean out her apartment.   Pertinent History BRCA negative [place] woman status post right breast upper outer quadrant and right axillary lymph node biopsy 05/18/2011, both positive for an invasive ductal carcinoma, high-grade, clinicallyT2 N1-2 or stage IIB/IIIA,. status post right modified radical mastectomy 10/03/2011. presented with T8 cord compression and underwent laminectomy 04/13/2014 with T8 decompression of spinal cord, posterior fixation from T6-T10 with pedicle screws and rods. Had LITT brain surgery at [facility] in 2016 to remove a tumor.Has had XRT to brain and thoracic spine. MRI of the thoracolumbar spine shows diffuse bony metastases particularly involving T8, T11 and T12, but without epidural disease. There were subcentimeter pulmonary nodules and indeterminate liver lesions. CT scan of the left  hip 09/06/2016 showed multiple bony metastases but no fracture.  Recently had 2 weeks of XRT to left hip. (Most of this history from Dr. Virgie Dad recent note.)   Currently in Pain? No/denies                         [facility] Adult PT Treatment/Exercise - 10/24/16 0001      Ambulation/Gait   Ambulation/Gait Yes   Ambulation/Gait  Assistance 6: Modified independent (Device/Increase time)   Ambulation Distance (Feet) 500 Feet  pt. reported feeling good after this; mild dyspnea   Assistive device Straight cane  cane in right hand, which is easier for her   Gait Pattern Step-through pattern     Neuro Re-ed    Neuro Re-ed Details  On BOSU ball in between treadmill handrails for UE support:  try to balance with one and then no UE support; then step in place with hands on front support;  standing on Airex pad in corner with eyes open then eyes closed (one loss of balance in 30 seconds), then stepping, then arms swings forward/back, then head turns and looking up and down, then partial ROM trunk twists     Knee/Hip Exercises: Standing   Forward Step Up Right;Left;10 reps;Hand Hold: 1;Step Height: 6"  left hand on back of recumbent bike seat   Other Standing Knee Exercises golfer's lift with support on left leg (couldn't do with weaker right leg) to pick up and set down a hand sanitizer bottle x 5; then mini-squats to pick up/put down hand sanitizer x 5, both done with one hand on back of chair for support.   Other Standing Knee Exercises knee flexion and extension control exercises vs. yellow Theraband x 15 each, on right side     Knee/Hip Exercises: Supine   Quad Sets Limitations                     Short Term Clinic Goals - 10/24/16 1303      CC Short Term Goal  #1   Title Pt. will be independent with beginning home exercise program for general strengthening and conditioning.   Status Achieved     CC Short Term Goal  #2   Title Pt. will be able to walk at least 150 feet using a cane with close supervision.   Status Achieved             Long Term Clinic Goals - 10/24/16 1305      CC Long Term Goal  #1   Title Pt. will be able to walk 500 feet using a single point cane independently to improve her mobility for return to work.   Status Achieved     CC Long Term Goal  #2   Title Pt. will report  at least 50% greater ease with ascending stairs to get into the apartment where she lives.   Baseline She reports no greater ease as of 10/24/16; however, she is moving to an apartment with no steps   Status On-going     CC Long Term Goal  #3   Title Pt. will know a safe program of exercise to do at home or at the gym at her apartment complex.   Status On-going            Plan - 10/24/16 1647    Clinical Impression Statement Pt. met a long term goal today by demonstrating she could walk with single point cane  500 feet unassisted.  She uses the cane in her right hand because that is what she is more comfortable with, despite that being her weaker side.  She does not need to walk any longer distance to get to her job.  However, she reports still feeling "wobby" and would like to have more confidence in her balance before returning to work. She feels that Theraband knee control exercises, standing on Airex trunk rotation, and step-ups were especially beneficial today.  She did not feel she could pedal the bike after doing 500 foot walk.   Rehab Potential Good   Clinical Impairments Affecting Rehab Potential bony and other metastases   PT Frequency 2x / week   PT Duration 8 weeks   PT Treatment/Interventions ADLs/Self Care Home Management;Moist Heat;Cryotherapy;Gait training;Stair training;Functional mobility training;Therapeutic exercise;Balance training;Neuromuscular re-education;Patient/family education;Manual techniques   PT Next Visit Plan continue with her single point cane, continue step training; continue walking in parallel bars forward, backward, braiding, sidestepping etc. Cont strengthening with functional activities as opposed to weight training due to bony mets; gait and balance training explore yoga poses/options  for home exercise    Consulted and Agree with Plan of Care Patient      Patient will benefit from skilled therapeutic intervention in order to improve the following  deficits and impairments:  Abnormal gait, Decreased endurance, Decreased balance, Decreased strength  Visit Diagnosis: Muscle weakness (generalized)  Unsteadiness on feet  Difficulty in walking, not elsewhere classified  Weakness generalized     Problem List Patient Active Problem List   Diagnosis Date Noted  . AKI (acute kidney injury) ([place])   . Chest pain   . Hypercalcemia 09/05/2016  . ARF (acute renal failure) ([place]) 09/05/2016  . Bone metastases ([place]) 01/17/2016  . Genetic testing 10/15/2014  . Hypokalemia 06/17/2014  . Breast cancer metastasized to brain ([place])   . 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 Malnutrition of moderate degree ([place]) 04/13/2014  . Myelopathy ([place]) 04/12/2014  . Pathologic fracture of thoracic vertebrae 04/12/2014  . Biliary colic 31/54/0086  . Malignant neoplasm of upper-outer quadrant of right breast in female, estrogen receptor positive ([place]) 09/24/2013  . S/P total hysterectomy and bilateral salpingo-oophorectomy 12/23/2012    SALISBURY,DONNA 10/24/2016, 4:52 PM  [facility] [place], [place], [address] Phone: [phone]   Fax:  [phone]  Name: CAMIAH HUMM MRN: [id number] Date of Birth: 1970-11-09  Serafina Royals, PT 10/24/16 4:52 PM

## 2016-10-25 ENCOUNTER — Telehealth: Payer: Self-pay

## 2016-10-25 NOTE — Telephone Encounter (Signed)
Refill request from [facility] for exemestane denied d/t pt no longer taking this medication.

## 2016-10-26 ENCOUNTER — Encounter: Payer: Self-pay | Admitting: Physical Therapy

## 2016-10-26 ENCOUNTER — Telehealth: Payer: Self-pay | Admitting: Pharmacist

## 2016-10-26 ENCOUNTER — Ambulatory Visit: Payer: BC Managed Care – PPO | Admitting: Physical Therapy

## 2016-10-26 DIAGNOSIS — R2681 Unsteadiness on feet: Secondary | ICD-10-CM

## 2016-10-26 DIAGNOSIS — R262 Difficulty in walking, not elsewhere classified: Secondary | ICD-10-CM

## 2016-10-26 DIAGNOSIS — M6281 Muscle weakness (generalized): Secondary | ICD-10-CM | POA: Diagnosis not present

## 2016-10-26 MED FILL — AFINITOR 10 MG TABLET: 10 | 28 days supply | Qty: 28 | Fill #1

## 2016-10-26 MED FILL — DEXAMETHASONE 0.5 MG/5 ML L: 0.5 | 12 days supply | Qty: 500 | Fill #1

## 2016-10-26 NOTE — Therapy (Signed)
[place], [place], [address] Phone: [phone]   Fax:  [phone]  Physical Therapy Treatment  Patient Details  Name: Carolyn [name] MRN: [id number] Date of Birth: [birth year] Referring Provider: Dr. Gunnar Bulla Magrinat  Encounter Date: 10/26/2016      PT End of Session - 10/26/16 1151    Visit Number 9   Number of Visits 17   Date for PT Re-Evaluation 12/08/16   PT Start Time 1025  pt arrived late due to transportation   PT Stop Time 1103   PT Time Calculation (min) 38 min   Activity Tolerance Patient tolerated treatment well   Behavior During Therapy [facility] for tasks assessed/performed      Past Medical History:  Diagnosis Date  . 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 History of chemotherapy 06/27/11 -09/04/11   neoadjuvant  . Hx of radiation therapy 11/08/11 -12/26/11   right chest wall/supraclav fossa, right scar  . S/P radiation therapy 05/08/14   SRS brain    Past Surgical History:  Procedure Laterality Date  . brain surgery-LITT Left 11/20/14   LITT procedure for Lt brain met.  Marland Kitchen BREAST BIOPSY     Left  . CHOLECYSTECTOMY N/A 03/01/2014   Procedure: LAPAROSCOPIC CHOLECYSTECTOMY;  Surgeon: Leighton Ruff, MD;  [facility];  Service: General;  Laterality: N/A;  . LAMINECTOMY N/A 04/13/2014   Procedure: THORACIC LAMINECTOMY WITH FIXATION THORACIC SIX-THORACIC TEN FUSION;  Surgeon: Kristeen Miss, MD;  Location: [facility];  Service: Neurosurgery;  Laterality: N/A;  . MODIFIED MASTECTOMY  10/03/2011   Procedure: MODIFIED MASTECTOMY;  Surgeon: Haywood Lasso, MD;  Location: [facility];  Service: General;  Laterality: Right;  . PORT-A-CATH REMOVAL  01/30/2012   Procedure: MINOR REMOVAL PORT-A-CATH;  Surgeon: Haywood Lasso, MD;  Location: [facility];  Service: General;  Laterality: Left;  . PORTACATH  PLACEMENT  06/20/2011   Procedure: INSERTION PORT-A-CATH;  Surgeon: Haywood Lasso, MD;  Location: [facility];  Service: General;  Laterality: N/A;  . ROBOTIC ASSISTED TOTAL HYSTERECTOMY WITH BILATERAL SALPINGO OOPHERECTOMY Bilateral 12/23/2012   Procedure: ROBOTIC ASSISTED TOTAL HYSTERECTOMY WITH BILATERAL SALPINGO OOPHORECTOMY;  Surgeon: Marvene Staff, MD;  Location: [facility];  Service: Gynecology;  Laterality: Bilateral;  . WISDOM TOOTH EXTRACTION      There were no vitals filed for this visit.      Subjective Assessment - 10/26/16 1025    Subjective Last time was really good. I was a little sore. Walking is going pretty good. I am getting a little less wobbly.    Pertinent History BRCA negative [place] woman status post right breast upper outer quadrant and right axillary lymph node biopsy 05/18/2011, both positive for an invasive ductal carcinoma, high-grade, clinicallyT2 N1-2 or stage IIB/IIIA,. status post right modified radical mastectomy 10/03/2011. presented with T8 cord compression and underwent laminectomy 04/13/2014 with T8 decompression of spinal cord, posterior fixation from T6-T10 with pedicle screws and rods. Had LITT brain surgery at [facility] in 2016 to remove a tumor.Has had XRT to brain and thoracic spine. MRI of the thoracolumbar spine shows diffuse bony metastases particularly involving T8, T11 and T12, but without epidural disease. There were subcentimeter pulmonary nodules and indeterminate liver lesions. CT scan of the left hip 09/06/2016 showed multiple bony metastases but no fracture.  Recently had 2 weeks of XRT to left hip. (Most of this history from Dr.  Magrinat's recent note.)   Patient Stated Goals to walk, try to get my balance; walk without the walker, but with a cane   Currently in Pain? No/denies   Pain Score 0-No pain                         [facility] Adult PT Treatment/Exercise - 10/26/16 0001      High Level Balance   High Level Balance  Comments down hallway with use of straight cane: heel toe walking x 2, toe walking x 2, tandem walking x 2, braiding x 2, sidestepping with mini squat x 2     Neuro Re-ed    Neuro Re-ed Details  On BOSU ball in between treadmill handrails for UE support:  try to balance with one and then no UE support; then step in place with hands on front support;  standing on Airex pad in corner with eyes open then eyes closed (two loss of balance in 30 seconds), then stepping, then arms swings forward/back, then head turns and looking up and down, then partial ROM trunk twists     Knee/Hip Exercises: Aerobic   Recumbent Bike Level 1 x 5 min  SpO2 93%, HR 128 at end of this                   Short Term Clinic Goals - 10/24/16 1303      CC Short Term Goal  #1   Title Pt. will be independent with beginning home exercise program for general strengthening and conditioning.   Status Achieved     CC Short Term Goal  #2   Title Pt. will be able to walk at least 150 feet using a cane with close supervision.   Status Achieved             Long Term Clinic Goals - 10/24/16 1305      CC Long Term Goal  #1   Title Pt. will be able to walk 500 feet using a single point cane independently to improve her mobility for return to work.   Status Achieved     CC Long Term Goal  #2   Title Pt. will report at least 50% greater ease with ascending stairs to get into the apartment where she lives.   Baseline She reports no greater ease as of 10/24/16; however, she is moving to an apartment with no steps   Status On-going     CC Long Term Goal  #3   Title Pt. will know a safe program of exercise to do at home or at the gym at her apartment complex.   Status On-going            Plan - 10/26/16 1152    Clinical Impression Statement Pt continues to ambulate well with use of straight cane. She is planning on going back to work in November. Today pt performed balance activities in the hall with use  of gait belt and straight cane instead of using the parallel bars. She only had 2 losses of balance with this that required CGA to min A to correct. Continued with work on [employer] and airex. Pt was also able to tolerate an additional minute on the stationary bike.    Rehab Potential Good   Clinical Impairments Affecting Rehab Potential bony and other metastases   PT Frequency 2x / week   PT Duration 8 weeks   PT Treatment/Interventions ADLs/Self Care Home Management;Moist Heat;Cryotherapy;Gait  training;Stair training;Functional mobility training;Therapeutic exercise;Balance training;Neuromuscular re-education;Patient/family education;Manual techniques   PT Next Visit Plan continue with her single point cane, continue step training; continue walking in parallel bars forward, backward, braiding, sidestepping etc. Cont strengthening with functional activities as opposed to weight training due to bony mets; gait and balance training explore yoga poses/options  for home exercise    Consulted and Agree with Plan of Care Patient      Patient will benefit from skilled therapeutic intervention in order to improve the following deficits and impairments:  Abnormal gait, Decreased endurance, Decreased balance, Decreased strength  Visit Diagnosis: Muscle weakness (generalized)  Unsteadiness on feet  Difficulty in walking, not elsewhere classified     Problem List Patient Active Problem List   Diagnosis Date Noted  . AKI (acute kidney injury) ([place])   . Chest pain   . Hypercalcemia 09/05/2016  . ARF (acute renal failure) ([place]) 09/05/2016  . Bone metastases ([place]) 01/17/2016  . Genetic testing 10/15/2014  . Hypokalemia 06/17/2014  . Breast cancer metastasized to brain ([place])   . 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 Malnutrition of moderate degree ([place]) 04/13/2014  . Myelopathy ([place]) 04/12/2014  . Pathologic fracture of thoracic vertebrae 04/12/2014  . Biliary colic 33/35/4562  . Malignant  neoplasm of upper-outer quadrant of right breast in female, estrogen receptor positive ([place]) 09/24/2013  . S/P total hysterectomy and bilateral salpingo-oophorectomy 12/23/2012    Allyson Sabal [facility] 10/26/2016, 11:55 AM  [place], [place], [address] Phone: [phone]   Fax:  [phone]  Name: Carolyn [name] MRN: [id number] Date of Birth: November 17, 1970  Manus Gunning, PT 10/26/16 11:55 AM

## 2016-10-26 NOTE — Telephone Encounter (Signed)
Oral Oncology Pharmacist Encounter  Received call from [facility] that patient may have treatment break in Afinitor due to needing to order the medication.  Patient alerted that pharmacy she needed a refill when she had 1 tablet remaining. Afinitor is a drop-ship item and may not be in stock in the pharmacy until next Tuesday.  I called and LVM for patient with offer for samples x 1 week to help bridge if needed. I provided number to Oral Oncology Clinic for call back if samples are needed.  The pharmacy will follow-up with Ms. Trier as well.  Oral Oncology Clinic will continue to follow.  Johny Drilling, PharmD, BCPS, BCOP 10/26/2016 10:31 AM Oral Oncology Clinic [phone]

## 2016-10-27 NOTE — Telephone Encounter (Signed)
No entry 

## 2016-11-02 ENCOUNTER — Telehealth: Payer: Self-pay | Admitting: Physical Therapy

## 2016-11-02 ENCOUNTER — Ambulatory Visit: Payer: BC Managed Care – PPO | Admitting: Physical Therapy

## 2016-11-02 ENCOUNTER — Telehealth: Payer: Self-pay

## 2016-11-02 NOTE — Telephone Encounter (Signed)
Pt called asking if she can get flu shot when gets xgeva shot. Instructed her it was OK and to request it from [facility] when she gets here.   She also requested a letter. She goes back to work November 6th. Needs letter OK to return to work.  She will pick it up when ready.

## 2016-11-02 NOTE — Telephone Encounter (Signed)
Called pt after she missed her 1:45 pm appointment today. She states she looked at the wrong week and thought it was tomorrow. She was reminded of her appointments for next week. Allyson Sabal [place], [place] 11/02/16 3:02 PM

## 2016-11-06 ENCOUNTER — Ambulatory Visit
Admission: RE | Admit: 2016-11-06 | Discharge: 2016-11-06 | Disposition: A | Discharge status: Home / Self Care | Payer: BC Managed Care – PPO | Source: Ambulatory Visit | Attending: Oncology | Admitting: Oncology

## 2016-11-06 DIAGNOSIS — Z1231 Encounter for screening mammogram for malignant neoplasm of breast: Secondary | ICD-10-CM

## 2016-11-07 ENCOUNTER — Ambulatory Visit: Payer: BC Managed Care – PPO

## 2016-11-07 DIAGNOSIS — R2681 Unsteadiness on feet: Secondary | ICD-10-CM

## 2016-11-07 DIAGNOSIS — M6281 Muscle weakness (generalized): Secondary | ICD-10-CM | POA: Diagnosis not present

## 2016-11-07 DIAGNOSIS — R262 Difficulty in walking, not elsewhere classified: Secondary | ICD-10-CM

## 2016-11-07 NOTE — Therapy (Signed)
[place], [place], [address] Phone: [phone]   Fax:  [phone]  Physical Therapy Treatment  Patient Details  Name: Carolyn [name] MRN: [id number] Date of Birth: December 20, 1970 Referring Provider: Dr. Gunnar Bulla Magrinat  Encounter Date: 11/07/2016      PT End of Session - 11/07/16 1157    Visit Number 10   Number of Visits 17   Date for PT Re-Evaluation 12/08/16   PT Start Time 1110   PT Stop Time 1157   PT Time Calculation (min) 47 min   Equipment Utilized During Treatment Gait belt   Activity Tolerance Patient tolerated treatment well   Behavior During Therapy [facility] for tasks assessed/performed      Past Medical History:  Diagnosis Date  . 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 History of chemotherapy 06/27/11 -09/04/11   neoadjuvant  . Hx of radiation therapy 11/08/11 -12/26/11   right chest wall/supraclav fossa, right scar  . S/P radiation therapy 05/08/14   SRS brain    Past Surgical History:  Procedure Laterality Date  . brain surgery-LITT Left 11/20/14   LITT procedure for Lt brain met.  Marland Kitchen BREAST BIOPSY     Left  . CHOLECYSTECTOMY N/A 03/01/2014   Procedure: LAPAROSCOPIC CHOLECYSTECTOMY;  Surgeon: Leighton Ruff, MD;  Location: [facility];  Service: General;  Laterality: N/A;  . LAMINECTOMY N/A 04/13/2014   Procedure: THORACIC LAMINECTOMY WITH FIXATION THORACIC SIX-THORACIC TEN FUSION;  Surgeon: Kristeen Miss, MD;  Location: [place] [facility];  Service: Neurosurgery;  Laterality: N/A;  . MODIFIED MASTECTOMY  10/03/2011   Procedure: MODIFIED MASTECTOMY;  Surgeon: Haywood Lasso, MD;  Location: [place];  Service: General;  Laterality: Right;  . PORT-A-CATH REMOVAL  01/30/2012   Procedure: MINOR REMOVAL PORT-A-CATH;  Surgeon: Haywood Lasso, MD;  Location: [place];  Service: General;  Laterality: Left;  .  PORTACATH PLACEMENT  06/20/2011   Procedure: INSERTION PORT-A-CATH;  Surgeon: Haywood Lasso, MD;  Location: [place];  Service: General;  Laterality: N/A;  . ROBOTIC ASSISTED TOTAL HYSTERECTOMY WITH BILATERAL SALPINGO OOPHERECTOMY Bilateral 12/23/2012   Procedure: ROBOTIC ASSISTED TOTAL HYSTERECTOMY WITH BILATERAL SALPINGO OOPHORECTOMY;  Surgeon: Marvene Staff, MD;  Location: [place] [facility];  Service: Gynecology;  Laterality: Bilateral;  . WISDOM TOOTH EXTRACTION      There were no vitals filed for this visit.      Subjective Assessment - 11/07/16 1115    Subjective Started taking Anastrozole about 2 weeks ago and it has been making me feel tired and fatigued. So I was a little tired after my last visit.      Pertinent History BRCA negative [place] woman status post right breast upper outer quadrant and right axillary lymph node biopsy 05/18/2011, both positive for an invasive ductal carcinoma, high-grade, clinicallyT2 N1-2 or stage IIB/IIIA,. status post right modified radical mastectomy 10/03/2011. presented with T8 cord compression and underwent laminectomy 04/13/2014 with T8 decompression of spinal cord, posterior fixation from T6-T10 with pedicle screws and rods. Had LITT brain surgery at [facility] in 2016 to remove a tumor.Has had XRT to brain and thoracic spine. MRI of the thoracolumbar spine shows diffuse bony metastases particularly involving T8, T11 and T12, but without epidural disease. There were subcentimeter pulmonary nodules and indeterminate liver lesions. CT scan of the left hip 09/06/2016 showed multiple bony metastases but no fracture.  Recently had 2 weeks of XRT to  left hip. (Most of this history from Dr. Virgie Dad recent note.)   Patient Stated Goals to walk, try to get my balance; walk without the walker, but with a cane   Currently in Pain? No/denies                         [facility] Adult PT Treatment/Exercise - 11/07/16 0001      High Level Balance   High  Level Balance Comments Down hallway with use of straight cane: heel toe walking x 2, toe walking x 2, tandem walking x 2, braiding x 2, sidestepping with mini squat x 1 (could only do 1 of squats due to fatigue) with gait belt for CGA during     Neuro Re-ed    Neuro Re-ed Details  On BOSU ball in between treadmill handrails for UE support:  try to balance with Rt UE, then Lt UE and then no UE support for 45 sec trials each; then step in place with hands on front support for 45 sec;  standing on Airex pad in corner for following: with eyes open with narrow BOS x1 min; then eyes closed with narrow BOS (a few loss of balances for 2 sets of 30 sec), then alt stepping 2x30 sec, then with narrow BOS for arms swings forward/back x30 sec, then partial ROM trunk twists x1 min with 3 LOB.     Knee/Hip Exercises: Aerobic   Recumbent Bike Level 1 x 4 min, pt required mutlpile rest breaks during this today due to fatigue  SpO2 96%, HR 129 BPM                   Short Term Clinic Goals - 10/24/16 1303      CC Short Term Goal  #1   Title Pt. will be independent with beginning home exercise program for general strengthening and conditioning.   Status Achieved     CC Short Term Goal  #2   Title Pt. will be able to walk at least 150 feet using a cane with close supervision.   Status Achieved             Long Term Clinic Goals - 10/24/16 1305      CC Long Term Goal  #1   Title Pt. will be able to walk 500 feet using a single point cane independently to improve her mobility for return to work.   Status Achieved     CC Long Term Goal  #2   Title Pt. will report at least 50% greater ease with ascending stairs to get into the apartment where she lives.   Baseline She reports no greater ease as of 10/24/16; however, she is moving to an apartment with no steps   Status On-going     CC Long Term Goal  #3   Title Pt. will know a safe program of exercise to do at home or at the gym at her  apartment complex.   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 She feels confident in being able to return to work also with co-workers being willing to assist her as needed. She did well with high level balance activities today only requiring 2 seated rest breaks  during session.    Rehab Potential Good   Clinical Impairments Affecting Rehab Potential bony and other metastases   PT Frequency 2x / week   PT Duration 8 weeks   PT Treatment/Interventions ADLs/Self Care Home Management;Moist Heat;Cryotherapy;Gait training;Stair training;Functional mobility training;Therapeutic exercise;Balance training;Neuromuscular re-education;Patient/family education;Manual techniques   PT Next Visit Plan continue with her single point cane, continue step training; continue walking in parallel bars forward, backward, braiding, sidestepping etc. Cont strengthening with functional activities as opposed to weight training due to bony mets; gait and balance training explore yoga poses/options  for home exercise    Consulted and Agree with Plan of Care Patient      Patient will benefit from skilled therapeutic intervention in order to improve the following deficits and impairments:  Abnormal gait, Decreased endurance, Decreased balance, Decreased strength  Visit Diagnosis: Muscle weakness (generalized)  Unsteadiness on feet  Difficulty in walking, not elsewhere classified     Problem List Patient Active Problem List   Diagnosis Date Noted  . AKI (acute kidney injury) ([place])   . Chest pain   . Hypercalcemia 09/05/2016  . ARF (acute renal failure) ([place]) 09/05/2016  . Bone metastases ([place]) 01/17/2016  . Genetic testing 10/15/2014  . Hypokalemia 06/17/2014  . Breast cancer metastasized to brain ([place])   . 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 Malnutrition of moderate degree ([place]) 04/13/2014  . Myelopathy ([place]) 04/12/2014  . Pathologic fracture of thoracic vertebrae 04/12/2014  . Biliary colic 86/16/8372  . Malignant neoplasm of upper-outer quadrant of right breast in female, estrogen receptor positive ([place]) 09/24/2013  . S/P total hysterectomy and bilateral salpingo-oophorectomy 12/23/2012    Otelia Limes, PTA 11/07/2016, 12:14 PM  [place], [place], [address] Phone: [phone]   Fax:  [phone]  Name: DONIESHA LANDAU MRN: [id number] Date of Birth: 1970-10-14

## 2016-11-08 ENCOUNTER — Other Ambulatory Visit: Payer: Self-pay

## 2016-11-08 ENCOUNTER — Ambulatory Visit (HOSPITAL_COMMUNITY)
Admission: RE | Admit: 2016-11-08 | Discharge: 2016-11-08 | Disposition: A | Discharge status: Home / Self Care | Payer: BC Managed Care – PPO | Source: Ambulatory Visit | Attending: Oncology | Admitting: Oncology

## 2016-11-08 ENCOUNTER — Ambulatory Visit (HOSPITAL_BASED_OUTPATIENT_CLINIC_OR_DEPARTMENT_OTHER): Payer: BC Managed Care – PPO

## 2016-11-08 ENCOUNTER — Other Ambulatory Visit (HOSPITAL_BASED_OUTPATIENT_CLINIC_OR_DEPARTMENT_OTHER): Payer: BC Managed Care – PPO

## 2016-11-08 VITALS — BP 131/68 | HR 119 | Temp 97.5°F | Resp 18

## 2016-11-08 DIAGNOSIS — C50411 Malignant neoplasm of upper-outer quadrant of right female breast: Secondary | ICD-10-CM | POA: Diagnosis not present

## 2016-11-08 DIAGNOSIS — C773 Secondary and unspecified malignant neoplasm of axilla and upper limb lymph nodes: Secondary | ICD-10-CM

## 2016-11-08 DIAGNOSIS — Z23 Encounter for immunization: Secondary | ICD-10-CM

## 2016-11-08 DIAGNOSIS — C7931 Secondary malignant neoplasm of brain: Secondary | ICD-10-CM | POA: Diagnosis not present

## 2016-11-08 DIAGNOSIS — C7951 Secondary malignant neoplasm of bone: Secondary | ICD-10-CM | POA: Diagnosis not present

## 2016-11-08 DIAGNOSIS — Z170 Estrogen receptor positive status [ER+]: Principal | ICD-10-CM

## 2016-11-08 DIAGNOSIS — C50919 Malignant neoplasm of unspecified site of unspecified female breast: Secondary | ICD-10-CM

## 2016-11-08 DIAGNOSIS — D649 Anemia, unspecified: Secondary | ICD-10-CM

## 2016-11-08 LAB — CBC WITH DIFFERENTIAL/PLATELET
BASO%: 0.2 % (ref 0.0–2.0)
Basophils Absolute: 0 10*3/uL (ref 0.0–0.1)
EOS%: 0.2 % (ref 0.0–7.0)
Eosinophils Absolute: 0 10*3/uL (ref 0.0–0.5)
HCT: 20.3 % — ABNORMAL LOW (ref 34.8–46.6)
HGB: 6.3 g/dL — CL (ref 11.6–15.9)
LYMPH%: 32.1 % (ref 14.0–49.7)
MCH: 26.8 pg (ref 25.1–34.0)
MCHC: 31 g/dL — ABNORMAL LOW (ref 31.5–36.0)
MCV: 86.4 fL (ref 79.5–101.0)
MONO#: 0.4 10*3/uL (ref 0.1–0.9)
MONO%: 10 % (ref 0.0–14.0)
NEUT#: 2.3 10*3/uL (ref 1.5–6.5)
NEUT%: 57.5 % (ref 38.4–76.8)
NRBC: 2 % — AB (ref 0–0)
PLATELETS: 64 10*3/uL — AB (ref 145–400)
RBC: 2.35 10*6/uL — AB (ref 3.70–5.45)
RDW: 17.5 % — AB (ref 11.2–14.5)
WBC: 4.1 10*3/uL (ref 3.9–10.3)
lymph#: 1.3 10*3/uL (ref 0.9–3.3)

## 2016-11-08 LAB — COMPREHENSIVE METABOLIC PANEL
ALK PHOS: 146 U/L (ref 40–150)
ALT: 56 U/L — ABNORMAL HIGH (ref 0–55)
ANION GAP: 11 meq/L (ref 3–11)
AST: 59 U/L — ABNORMAL HIGH (ref 5–34)
Albumin: 3.5 g/dL (ref 3.5–5.0)
BILIRUBIN TOTAL: 0.25 mg/dL (ref 0.20–1.20)
BUN: 13.2 mg/dL (ref 7.0–26.0)
CO2: 23 meq/L (ref 22–29)
Calcium: 9.9 mg/dL (ref 8.4–10.4)
Chloride: 109 mEq/L (ref 98–109)
Creatinine: 1 mg/dL (ref 0.6–1.1)
GLUCOSE: 121 mg/dL (ref 70–140)
Potassium: 3.4 mEq/L — ABNORMAL LOW (ref 3.5–5.1)
SODIUM: 143 meq/L (ref 136–145)
TOTAL PROTEIN: 7.8 g/dL (ref 6.4–8.3)

## 2016-11-08 LAB — PREPARE RBC (CROSSMATCH)

## 2016-11-08 LAB — TECHNOLOGIST REVIEW

## 2016-11-08 MED ORDER — ACETAMINOPHEN 325 MG PO TABS
ORAL_TABLET | ORAL | Status: AC
  Filled 2016-11-08: qty 2

## 2016-11-08 MED ORDER — DIPHENHYDRAMINE HCL 25 MG PO CAPS
ORAL_CAPSULE | ORAL | Status: AC
  Filled 2016-11-08: qty 1

## 2016-11-08 MED ORDER — DIPHENHYDRAMINE HCL 25 MG PO CAPS
25.0000 mg | ORAL_CAPSULE | Freq: Once | ORAL | Status: AC
  Administered 2016-11-08: 25 mg via ORAL

## 2016-11-08 MED ORDER — DENOSUMAB 120 MG/1.7ML ~~LOC~~ SOLN
120.0000 mg | Freq: Once | SUBCUTANEOUS | Status: AC
  Administered 2016-11-08: 120 mg via SUBCUTANEOUS
  Filled 2016-11-08: qty 1.7

## 2016-11-08 MED ORDER — SODIUM CHLORIDE 0.9 % IV SOLN
250.0000 mL | Freq: Once | INTRAVENOUS | Status: AC
  Administered 2016-11-08: 250 mL via INTRAVENOUS

## 2016-11-08 MED ORDER — INFLUENZA VAC SPLIT QUAD 0.5 ML IM SUSY
0.5000 mL | PREFILLED_SYRINGE | Freq: Once | INTRAMUSCULAR | Status: AC
  Administered 2016-11-08: 0.5 mL via INTRAMUSCULAR
  Filled 2016-11-08: qty 0.5

## 2016-11-08 MED ORDER — ACETAMINOPHEN 325 MG PO TABS
650.0000 mg | ORAL_TABLET | Freq: Once | ORAL | Status: AC
  Administered 2016-11-08: 650 mg via ORAL

## 2016-11-08 NOTE — Patient Instructions (Signed)
Influenza Virus Vaccine (Flucelvax) What is this medicine? INFLUENZA VIRUS VACCINE (in floo EN zuh VAHY ruhs vak SEEN) helps to reduce the risk of getting influenza also known as the flu. The vaccine only helps protect you against some strains of the flu. This medicine may be used for other purposes; ask your health care provider or pharmacist if you have questions. COMMON BRAND NAME(S): FLUCELVAX What should I tell my health care provider before I take this medicine? They need to know if you have any of these conditions: -bleeding disorder like hemophilia -fever or infection -Guillain-Barre syndrome or other neurological problems -immune system problems -infection with the human immunodeficiency virus (HIV) or AIDS -low blood platelet counts -multiple sclerosis -an unusual or allergic reaction to influenza virus vaccine, other medicines, foods, dyes or preservatives -pregnant or trying to get pregnant -breast-feeding How should I use this medicine? This vaccine is for injection into a muscle. It is given by a health care professional. A copy of Vaccine Information Statements will be given before each vaccination. Read this sheet carefully each time. The sheet may change frequently. Talk to your pediatrician regarding the use of this medicine in children. Special care may be needed. Overdosage: If you think you've taken too much of this medicine contact a poison control center or emergency room at once. Overdosage: If you think you have taken too much of this medicine contact a poison control center or emergency room at once. NOTE: This medicine is only for you. Do not share this medicine with others. What if I miss a dose? This does not apply. What may interact with this medicine? -chemotherapy or radiation therapy -medicines that lower your immune system like etanercept, anakinra, infliximab, and adalimumab -medicines that treat or prevent blood clots like  warfarin -phenytoin -steroid medicines like prednisone or cortisone -theophylline -vaccines This list may not describe all possible interactions. Give your health care provider a list of all the medicines, herbs, non-prescription drugs, or dietary supplements you use. Also tell them if you smoke, drink alcohol, or use illegal drugs. Some items may interact with your medicine. What should I watch for while using this medicine? Report any side effects that do not go away within 3 days to your doctor or health care professional. Call your health care provider if any unusual symptoms occur within 6 weeks of receiving this vaccine. You may still catch the flu, but the illness is not usually as bad. You cannot get the flu from the vaccine. The vaccine will not protect against colds or other illnesses that may cause fever. The vaccine is needed every year. What side effects may I notice from receiving this medicine? Side effects that you should report to your doctor or health care professional as soon as possible: -allergic reactions like skin rash, itching or hives, swelling of the face, lips, or tongue Side effects that usually do not require medical attention (Report these to your doctor or health care professional if they continue or are bothersome.): -fever -headache -muscle aches and pains -pain, tenderness, redness, or swelling at the injection site -tiredness This list may not describe all possible side effects. Call your doctor for medical advice about side effects. You may report side effects to FDA at 1-800-FDA-1088. Where should I keep my medicine? The vaccine will be given by a health care professional in a clinic, pharmacy, doctor's office, or other health care setting. You will not be given vaccine doses to store at home. NOTE: This sheet is a summary.   It may not cover all possible information. If you have questions about this medicine, talk to your doctor, pharmacist, or health care  provider.  2018 Elsevier/Gold Standard (2010-12-07 14:06:47) Denosumab injection What is this medicine? DENOSUMAB (den oh sue mab) slows bone breakdown. Prolia is used to treat osteoporosis in women after menopause and in men. Xgeva is used to treat a high calcium level due to cancer and to prevent bone fractures and other bone problems caused by multiple myeloma or cancer bone metastases. Xgeva is also used to treat giant cell tumor of the bone. This medicine may be used for other purposes; ask your health care provider or pharmacist if you have questions. COMMON BRAND NAME(S): Prolia, XGEVA What should I tell my health care provider before I take this medicine? They need to know if you have any of these conditions: -dental disease -having surgery or tooth extraction -infection -kidney disease -low levels of calcium or Vitamin D in the blood -malnutrition -on hemodialysis -skin conditions or sensitivity -thyroid or parathyroid disease -an unusual reaction to denosumab, other medicines, foods, dyes, or preservatives -pregnant or trying to get pregnant -breast-feeding How should I use this medicine? This medicine is for injection under the skin. It is given by a health care professional in a hospital or clinic setting. If you are getting Prolia, a special MedGuide will be given to you by the pharmacist with each prescription and refill. Be sure to read this information carefully each time. For Prolia, talk to your pediatrician regarding the use of this medicine in children. Special care may be needed. For Xgeva, talk to your pediatrician regarding the use of this medicine in children. While this drug may be prescribed for children as young as 13 years for selected conditions, precautions do apply. Overdosage: If you think you have taken too much of this medicine contact a poison control center or emergency room at once. NOTE: This medicine is only for you. Do not share this medicine with  others. What if I miss a dose? It is important not to miss your dose. Call your doctor or health care professional if you are unable to keep an appointment. What may interact with this medicine? Do not take this medicine with any of the following medications: -other medicines containing denosumab This medicine may also interact with the following medications: -medicines that lower your chance of fighting infection -steroid medicines like prednisone or cortisone This list may not describe all possible interactions. Give your health care provider a list of all the medicines, herbs, non-prescription drugs, or dietary supplements you use. Also tell them if you smoke, drink alcohol, or use illegal drugs. Some items may interact with your medicine. What should I watch for while using this medicine? Visit your doctor or health care professional for regular checks on your progress. Your doctor or health care professional may order blood tests and other tests to see how you are doing. Call your doctor or health care professional for advice if you get a fever, chills or sore throat, or other symptoms of a cold or flu. Do not treat yourself. This drug may decrease your body's ability to fight infection. Try to avoid being around people who are sick. You should make sure you get enough calcium and vitamin D while you are taking this medicine, unless your doctor tells you not to. Discuss the foods you eat and the vitamins you take with your health care professional. See your dentist regularly. Brush and floss your teeth as   directed. Before you have any dental work done, tell your dentist you are receiving this medicine. Do not become pregnant while taking this medicine or for 5 months after stopping it. Talk with your doctor or health care professional about your birth control options while taking this medicine. Women should inform their doctor if they wish to become pregnant or think they might be pregnant. There  is a potential for serious side effects to an unborn child. Talk to your health care professional or pharmacist for more information. What side effects may I notice from receiving this medicine? Side effects that you should report to your doctor or health care professional as soon as possible: -allergic reactions like skin rash, itching or hives, swelling of the face, lips, or tongue -bone pain -breathing problems -dizziness -jaw pain, especially after dental work -redness, blistering, peeling of the skin -signs and symptoms of infection like fever or chills; cough; sore throat; pain or trouble passing urine -signs of low calcium like fast heartbeat, muscle cramps or muscle pain; pain, tingling, numbness in the hands or feet; seizures -unusual bleeding or bruising -unusually weak or tired Side effects that usually do not require medical attention (report to your doctor or health care professional if they continue or are bothersome): -constipation -diarrhea -headache -joint pain -loss of appetite -muscle pain -runny nose -tiredness -upset stomach This list may not describe all possible side effects. Call your doctor for medical advice about side effects. You may report side effects to FDA at 1-800-FDA-1088. Where should I keep my medicine? This medicine is only given in a clinic, doctor's office, or other health care setting and will not be stored at home. NOTE: This sheet is a summary. It may not cover all possible information. If you have questions about this medicine, talk to your doctor, pharmacist, or health care provider.  2018 Elsevier/Gold Standard (2016-01-18 19:17:21)  

## 2016-11-08 NOTE — Progress Notes (Signed)
Tolerated blood transfusion well. No evidence of adverse reactions.

## 2016-11-08 NOTE — Patient Instructions (Signed)

## 2016-11-09 LAB — TYPE AND SCREEN
ABO/RH(D): B POS
ANTIBODY SCREEN: NEGATIVE
UNIT DIVISION: 0
Unit division: 0

## 2016-11-09 LAB — BPAM RBC
BLOOD PRODUCT EXPIRATION DATE: 201811122359
Blood Product Expiration Date: 201811122359
ISSUE DATE / TIME: 201810311110
ISSUE DATE / TIME: 201810311110
Unit Type and Rh: 7300
Unit Type and Rh: 7300

## 2016-11-10 ENCOUNTER — Ambulatory Visit: Payer: BC Managed Care – PPO | Attending: Oncology | Admitting: Physical Therapy

## 2016-11-10 DIAGNOSIS — M6281 Muscle weakness (generalized): Secondary | ICD-10-CM

## 2016-11-10 DIAGNOSIS — R531 Weakness: Secondary | ICD-10-CM | POA: Diagnosis present

## 2016-11-10 DIAGNOSIS — R6889 Other general symptoms and signs: Secondary | ICD-10-CM | POA: Diagnosis present

## 2016-11-10 DIAGNOSIS — R29898 Other symptoms and signs involving the musculoskeletal system: Secondary | ICD-10-CM | POA: Insufficient documentation

## 2016-11-10 DIAGNOSIS — R262 Difficulty in walking, not elsewhere classified: Secondary | ICD-10-CM

## 2016-11-10 DIAGNOSIS — R2681 Unsteadiness on feet: Secondary | ICD-10-CM | POA: Diagnosis present

## 2016-11-10 NOTE — Therapy (Signed)
[place], [place], [address] Phone: [phone]   Fax:  [phone]  Physical Therapy Treatment  Patient Details  Name: Carolyn [name] MRN: [id number] Date of Birth: December 20, 1970 Referring Provider: Dr. Gunnar Bulla Magrinat  Encounter Date: 11/10/2016      PT End of Session - 11/10/16 1211    Visit Number 11   Number of Visits 17   PT Start Time 2836   PT Stop Time 1150   PT Time Calculation (min) 45 min   Activity Tolerance Patient tolerated treatment well   Behavior During Therapy [facility] for tasks assessed/performed      Past Medical History:  Diagnosis Date  . 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 History of chemotherapy 06/27/11 -09/04/11   neoadjuvant  . Hx of radiation therapy 11/08/11 -12/26/11   right chest wall/supraclav fossa, right scar  . S/P radiation therapy 05/08/14   SRS brain    Past Surgical History:  Procedure Laterality Date  . brain surgery-LITT Left 11/20/14   LITT procedure for Lt brain met.  Marland Kitchen BREAST BIOPSY     Left  . CHOLECYSTECTOMY N/A 03/01/2014   Procedure: LAPAROSCOPIC CHOLECYSTECTOMY;  Surgeon: Leighton Ruff, MD;  [facility];  Service: General;  Laterality: N/A;  . LAMINECTOMY N/A 04/13/2014   Procedure: THORACIC LAMINECTOMY WITH FIXATION THORACIC SIX-THORACIC TEN FUSION;  Surgeon: Kristeen Miss, MD;  Location: [place];  Service: Neurosurgery;  Laterality: N/A;  . MODIFIED MASTECTOMY  10/03/2011   Procedure: MODIFIED MASTECTOMY;  Surgeon: Haywood Lasso, MD;  Location: [facility];  Service: General;  Laterality: Right;  . PORT-A-CATH REMOVAL  01/30/2012   Procedure: MINOR REMOVAL PORT-A-CATH;  Surgeon: Haywood Lasso, MD;  Location: [place];  Service: General;  Laterality: Left;  . PORTACATH PLACEMENT  06/20/2011   Procedure: INSERTION PORT-A-CATH;  Surgeon: Haywood Lasso, MD;  Location: [facility];  Service: General;  Laterality: N/A;  . ROBOTIC ASSISTED TOTAL HYSTERECTOMY WITH BILATERAL SALPINGO OOPHERECTOMY Bilateral 12/23/2012   Procedure: ROBOTIC ASSISTED TOTAL HYSTERECTOMY WITH BILATERAL SALPINGO OOPHORECTOMY;  Surgeon: Marvene Staff, MD;  Location: [facility];  Service: Gynecology;  Laterality: Bilateral;  . WISDOM TOOTH EXTRACTION      There were no vitals filed for this visit.      Subjective Assessment - 11/10/16 1112    Subjective Pt states she started having pain in her right anterior thigh after doing walking squats last time.  She is going back to work on tuesday    Pertinent History BRCA negative [place] woman status post right breast upper outer quadrant and right axillary lymph node biopsy 05/18/2011, both positive for an invasive ductal carcinoma, high-grade, clinicallyT2 N1-2 or stage IIB/IIIA,. status post right modified radical mastectomy 10/03/2011. presented with T8 cord compression and underwent laminectomy 04/13/2014 with T8 decompression of spinal cord, posterior fixation from T6-T10 with pedicle screws and rods. Had LITT brain surgery at [facility] in 2016 to remove a tumor.Has had XRT to brain and thoracic spine. MRI of the thoracolumbar spine shows diffuse bony metastases particularly involving T8, T11 and T12, but without epidural disease. There were subcentimeter pulmonary nodules and indeterminate liver lesions. CT scan of the left hip 09/06/2016 showed multiple bony metastases but no fracture.  Recently had 2 weeks of XRT to left hip. (Most of this history from Dr. Virgie Dad recent note.)   Patient Stated Goals to walk,  try to get my balance; walk without the walker, but with a cane   Currently in Pain? Yes   Pain Score 5    Pain Location Leg   Pain Orientation Right   Pain Descriptors / Indicators Sharp   Pain Type Acute pain   Aggravating Factors  sitting too long    Pain Relieving Factors getting up and walking                           [facility] Adult PT Treatment/Exercise - 11/10/16 0001      Ambulation/Gait   Ambulation/Gait Assistance 6: Modified independent (Device/Increase time)   Ambulation Distance (Feet) 500 Feet   Assistive device Straight cane  raised cane up one notch for pt to stand up taller    Gait Pattern Step-through pattern     High Level Balance   High Level Balance Comments standing with core engaged and shoulders back for several minutes with small sway forward and back.      Balance Poses: Yoga   Warrior I 1 rep  pain in anterio right hip with right leg forward,     Lumbar Exercises: Seated   Other Seated Lumbar Exercises On red disc: hands out in front, then from hip to hip then raiside to shoulder height and to overhead 5-10 times each position all without losing balance  also did biceps curls with 3# x 10 reps    Other Seated Lumbar Exercises sideways seated quad stretch l     Lumbar Exercises: Supine   Bridge 10 reps   Straight Leg Raise 5 reps   Straight Leg Raises Limitations encouraged to do 4 parts: tighten, lift, lower, relax pt able to lift leg up about 2 inches      Knee/Hip Exercises: Standing   Forward Step Up Left;10 reps   Forward Step Up Limitations attempted to step up with right leg, but was unable due to pain                    Short Term Clinic Goals - 11/10/16 1218      CC Short Term Goal  #1   Title Pt. will be independent with beginning home exercise program for general strengthening and conditioning.   Status Achieved     CC Short Term Goal  #2   Title Pt. will be able to walk at least 150 feet using a cane with close supervision.   Status Achieved             Long Term Clinic Goals - 11/10/16 1218      CC Long Term Goal  #1   Title Pt. will be able to walk 500 feet using a single point cane independently to improve her mobility for return to work.   Status Achieved     CC Long Term Goal  #2   Title  Pt. will report at least 50% greater ease with ascending stairs to get into the apartment where she lives.   Time 8   Period Weeks   Status On-going     CC Long Term Goal  #3   Title Pt. will know a safe program of exercise to do at home or at the gym at her apartment complex.   Time 8   Period Weeks   Status On-going            Plan - 11/10/16 1212    Clinical Impression Statement  Pt with problems with right leg pain today due to ??  She has increased pain when she tries to step up onto 6" step with it. She has pain in hip flexor stretch.  She says it feels better after taking a few steps on it when walking. She hopes it is from sleeping wrong or  doing unaccustomed exercise.   She is returning back to work next week and will continue with PT treatments as she is able    Clinical Impairments Affecting Rehab Potential bony and other metastases   PT Treatment/Interventions ADLs/Self Care Home Management;Moist Heat;Cryotherapy;Gait training;Stair training;Functional mobility training;Therapeutic exercise;Balance training;Neuromuscular re-education;Patient/family education;Manual techniques   PT Next Visit Plan Ask about right leg pain. continue with her single point cane, continue step training; continue walking in parallel bars forward, backward, braiding, sidestepping etc. Cont strengthening with functional activities as opposed to weight training due to bony mets; gait and balance training explore yoga poses/options  for home exercise    Consulted and Agree with Plan of Care Patient      Patient will benefit from skilled therapeutic intervention in order to improve the following deficits and impairments:  Abnormal gait, Decreased endurance, Decreased balance, Decreased strength  Visit Diagnosis: Muscle weakness (generalized)  Unsteadiness on feet  Difficulty in walking, not elsewhere classified  Weakness generalized  Activity intolerance     Problem List Patient Active  Problem List   Diagnosis Date Noted  . AKI (acute kidney injury) ([place])   . Chest pain   . Hypercalcemia 09/05/2016  . ARF (acute renal failure) ([place]) 09/05/2016  . Bone metastases ([employer]) 01/17/2016  . Genetic testing 10/15/2014  . Hypokalemia 06/17/2014  . Breast cancer metastasized to brain ([place])   . 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 Malnutrition of moderate degree ([place]) 04/13/2014  . Myelopathy ([place]) 04/12/2014  . Pathologic fracture of thoracic vertebrae 04/12/2014  . Biliary colic 37/90/2409  . Malignant neoplasm of upper-outer quadrant of right breast in female, estrogen receptor positive ([place]) 09/24/2013  . S/P total hysterectomy and bilateral salpingo-oophorectomy 12/23/2012   Donato Heinz. Owens Shark PT  Norwood Levo 11/10/2016, 12:19 PM  [place], [place], [address] Phone: [phone]   Fax:  [phone]  Name: CHANEL MCADAMS MRN: [id number] Date of Birth: Mar 13, 1970

## 2016-11-16 MED FILL — DEXAMETHASONE 0.5 MG/5 ML L: 0.5 | 12 days supply | Qty: 500 | Fill #2

## 2016-11-22 MED FILL — AFINITOR 10 MG TABLET: 10 | 28 days supply | Qty: 28 | Fill #2

## 2016-11-23 ENCOUNTER — Ambulatory Visit: Payer: BC Managed Care – PPO | Admitting: Physical Therapy

## 2016-11-23 DIAGNOSIS — R2681 Unsteadiness on feet: Secondary | ICD-10-CM

## 2016-11-23 DIAGNOSIS — R262 Difficulty in walking, not elsewhere classified: Secondary | ICD-10-CM

## 2016-11-23 DIAGNOSIS — M6281 Muscle weakness (generalized): Secondary | ICD-10-CM

## 2016-11-23 DIAGNOSIS — R6889 Other general symptoms and signs: Secondary | ICD-10-CM

## 2016-11-23 DIAGNOSIS — R29898 Other symptoms and signs involving the musculoskeletal system: Secondary | ICD-10-CM

## 2016-11-23 NOTE — Therapy (Signed)
[place], [place], [address] Phone: [phone]   Fax:  [phone]  Physical Therapy Treatment  Patient Details  Name: Carolyn [name] MRN: [id number] Date of Birth: 10-31-1970 Referring Provider: Dr. Gunnar Bulla Magrinat   Encounter Date: 11/23/2016  PT End of Session - 11/23/16 1528    Visit Number  12    Number of Visits  17    Date for PT Re-Evaluation  12/08/16    PT Start Time  1520    PT Stop Time  1600    PT Time Calculation (min)  40 min    Activity Tolerance  Patient tolerated treatment well    Behavior During Therapy  [facility] for tasks assessed/performed       Past Medical History:  Diagnosis Date  . 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 History of chemotherapy 06/27/11 -09/04/11   neoadjuvant  . Hx of radiation therapy 11/08/11 -12/26/11   right chest wall/supraclav fossa, right scar  . S/P radiation therapy 05/08/14   SRS brain    Past Surgical History:  Procedure Laterality Date  . brain surgery-LITT Left 11/20/14   LITT procedure for Lt brain met.  Marland Kitchen BREAST BIOPSY     Left  . CHOLECYSTECTOMY N/A 03/01/2014   Procedure: LAPAROSCOPIC CHOLECYSTECTOMY;  Surgeon: Leighton Ruff, MD;  [facility];  Service: General;  Laterality: N/A;  . LAMINECTOMY N/A 04/13/2014   Procedure: THORACIC LAMINECTOMY WITH FIXATION THORACIC SIX-THORACIC TEN FUSION;  Surgeon: Kristeen Miss, MD;  Location: [facility];  Service: Neurosurgery;  Laterality: N/A;  . MODIFIED MASTECTOMY  10/03/2011   Procedure: MODIFIED MASTECTOMY;  Surgeon: Haywood Lasso, MD;  Location: [facility];  Service: General;  Laterality: Right;  . PORT-A-CATH REMOVAL  01/30/2012   Procedure: MINOR REMOVAL PORT-A-CATH;  Surgeon: Haywood Lasso, MD;  Location: [facility];  Service: General;  Laterality: Left;  . PORTACATH PLACEMENT  06/20/2011   Procedure:  INSERTION PORT-A-CATH;  Surgeon: Haywood Lasso, MD;  Location: [facility];  Service: General;  Laterality: N/A;  . ROBOTIC ASSISTED TOTAL HYSTERECTOMY WITH BILATERAL SALPINGO OOPHERECTOMY Bilateral 12/23/2012   Procedure: ROBOTIC ASSISTED TOTAL HYSTERECTOMY WITH BILATERAL SALPINGO OOPHORECTOMY;  Surgeon: Marvene Staff, MD;  Location: [facility];  Service: Gynecology;  Laterality: Bilateral;  . WISDOM TOOTH EXTRACTION      There were no vitals filed for this visit.  Subjective Assessment - 11/23/16 1524    Subjective  Pt has gone back to work, and walks frequently during the day, and has started swimming twice a week.   She feels alot better today and the pain in her thigh has resolved. She feels like she is ready to discharge    Pertinent History  BRCA negative [place] woman status post right breast upper outer quadrant and right axillary lymph node biopsy 05/18/2011, both positive for an invasive ductal carcinoma, high-grade, clinicallyT2 N1-2 or stage IIB/IIIA,. status post right modified radical mastectomy 10/03/2011. presented with T8 cord compression and underwent laminectomy 04/13/2014 with T8 decompression of spinal cord, posterior fixation from T6-T10 with pedicle screws and rods. Had LITT brain surgery at [facility] in 2016 to remove a tumor.Has had XRT to brain and thoracic spine. MRI of the thoracolumbar spine shows diffuse bony metastases particularly involving T8, T11 and T12, but without epidural disease. There were subcentimeter pulmonary nodules and indeterminate liver lesions. CT scan of the left hip  09/06/2016 showed multiple bony metastases but no fracture.  Recently had 2 weeks of XRT to left hip. (Most of this history from Dr. Virgie Dad recent note.)    Patient Stated Goals  to walk, try to get my balance; walk without the walker, but with a cane                      [facility] Adult PT Treatment/Exercise - 11/23/16 0001      High Level Balance   High Level Balance  Activities  Side stepping;Braiding    High Level Balance Comments  standing in a corner with core engaged and maintained in midine  wall taps from side to side reaching up for wall taps for greater lateral body excursion, also did it while standing on pillow       Self-Care   Self-Care  Other Self-Care Comments    Other Self-Care Comments   reviewed back care precautions to avoind bending, arching and twisting .  Talked about pt getting a lumbar support pillow for chair at work       Lumbar Exercises: Seated   Other Seated Lumbar Exercises   hands out in front, then from hip to hip then raiside to shoulder height and to overhead 5-10 times each position all without losing balance  also did biceps curls with 3# x 10 reps       Knee/Hip Exercises: Standing   Hip Flexion  Stengthening;Right;Left;10 reps    Hip Extension  Stengthening;Right;Left;10 reps    Other Standing Knee Exercises  heels up x 10, toes up x 10     Other Standing Knee Exercises  one legged stance                 Short Term Clinic Goals - 11/23/16 1528      CC Short Term Goal  #1   Title  Pt. will be independent with beginning home exercise program for general strengthening and conditioning.    Status  Achieved      CC Short Term Goal  #2   Title  Pt. will be able to walk at least 150 feet using a cane with close supervision.    Status  Achieved          Long Term Clinic Goals - 11/23/16 1529      CC Long Term Goal  #1   Title  Pt. will be able to walk 500 feet using a single point cane independently to improve her mobility for return to work.    Status  Achieved      CC Long Term Goal  #2   Title  Pt. will report at least 50% greater ease with ascending stairs to get into the apartment where she lives.    Baseline  She is back in her old apt and she can get up the steps using 2 railings    Status  Achieved      CC Long Term Goal  #3   Title  Pt. will know a safe program of exercise to do at home  or at the gym at her apartment complex.    Status  Achieved         Plan - 11/23/16 1759    Clinical Impression Statement  Pt is much improved.  She is back living in her own apartment on her own, is back to work and is doing community exercise aquatic exercise program twice a week in addition to home  program.  She feels that she is ready to discharge from PT     PT Next Visit Plan  Discharged from PT     Consulted and Agree with Plan of Care  Patient       Patient will benefit from skilled therapeutic intervention in order to improve the following deficits and impairments:     Visit Diagnosis: Muscle weakness (generalized)  Unsteadiness on feet  Difficulty in walking, not elsewhere classified  Activity intolerance  Weakness of right leg     Problem List Patient Active Problem List   Diagnosis Date Noted  . AKI (acute kidney injury) ([place])   . Chest pain   . Hypercalcemia 09/05/2016  . ARF (acute renal failure) ([place]) 09/05/2016  . Bone metastases ([place]) 01/17/2016  . Genetic testing 10/15/2014  . Hypokalemia 06/17/2014  . Breast cancer metastasized to brain ([place])   . 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 Malnutrition of moderate degree ([place]) 04/13/2014  . Myelopathy ([place]) 04/12/2014  . Pathologic fracture of thoracic vertebrae 04/12/2014  . Biliary colic 44/46/1901  . Malignant neoplasm of upper-outer quadrant of right breast in female, estrogen receptor positive ([place]) 09/24/2013  . S/P total hysterectomy and bilateral salpingo-oophorectomy 12/23/2012   PHYSICAL THERAPY DISCHARGE SUMMARY  Visits from Start of Care: 12  Current functional level related to goals / functional outcomes: Pt is walking with a straight cane, but is functionally independent    Remaining deficits: Leg weakness,Rt< Lt, unsteadiness of gait    Education / Equipment:home exercise   Plan: Patient agrees to discharge.  Patient goals were met. Patient is being discharged due to  being pleased with the current functional level.  ?????    Donato Heinz. Owens Shark PT  Norwood Levo 11/23/2016, [place], [place], [address] Phone: [phone]   Fax:  [phone]  Name: Carolyn [name] MRN: [id number] Date of Birth: 1970/01/16

## 2016-11-28 ENCOUNTER — Encounter: Payer: BC Managed Care – PPO | Admitting: Physical Therapy

## 2016-12-04 ENCOUNTER — Other Ambulatory Visit: Payer: Self-pay | Admitting: *Deleted

## 2016-12-04 ENCOUNTER — Ambulatory Visit (HOSPITAL_COMMUNITY)
Admission: RE | Admit: 2016-12-04 | Discharge: 2016-12-04 | Disposition: A | Discharge status: Home / Self Care | Payer: BC Managed Care – PPO | Source: Ambulatory Visit | Attending: Oncology | Admitting: Oncology

## 2016-12-04 DIAGNOSIS — C50911 Malignant neoplasm of unspecified site of right female breast: Secondary | ICD-10-CM

## 2016-12-04 DIAGNOSIS — C7949 Secondary malignant neoplasm of other parts of nervous system: Principal | ICD-10-CM

## 2016-12-04 DIAGNOSIS — C50411 Malignant neoplasm of upper-outer quadrant of right female breast: Secondary | ICD-10-CM | POA: Insufficient documentation

## 2016-12-04 DIAGNOSIS — C787 Secondary malignant neoplasm of liver and intrahepatic bile duct: Secondary | ICD-10-CM | POA: Insufficient documentation

## 2016-12-04 DIAGNOSIS — J90 Pleural effusion, not elsewhere classified: Secondary | ICD-10-CM | POA: Insufficient documentation

## 2016-12-04 DIAGNOSIS — C7931 Secondary malignant neoplasm of brain: Secondary | ICD-10-CM | POA: Diagnosis present

## 2016-12-04 DIAGNOSIS — S2232XA Fracture of one rib, left side, initial encounter for closed fracture: Secondary | ICD-10-CM | POA: Insufficient documentation

## 2016-12-04 DIAGNOSIS — Z923 Personal history of irradiation: Secondary | ICD-10-CM | POA: Insufficient documentation

## 2016-12-04 DIAGNOSIS — C7951 Secondary malignant neoplasm of bone: Secondary | ICD-10-CM | POA: Diagnosis present

## 2016-12-04 DIAGNOSIS — R911 Solitary pulmonary nodule: Secondary | ICD-10-CM | POA: Diagnosis not present

## 2016-12-04 DIAGNOSIS — Z170 Estrogen receptor positive status [ER+]: Secondary | ICD-10-CM

## 2016-12-04 DIAGNOSIS — D1803 Hemangioma of intra-abdominal structures: Secondary | ICD-10-CM | POA: Insufficient documentation

## 2016-12-04 MED ORDER — GADOBENATE DIMEGLUMINE 529 MG/ML IV SOLN
15.0000 mL | Freq: Once | INTRAVENOUS | Status: AC | PRN
  Administered 2016-12-04: 14 mL via INTRAVENOUS

## 2016-12-06 ENCOUNTER — Other Ambulatory Visit (HOSPITAL_BASED_OUTPATIENT_CLINIC_OR_DEPARTMENT_OTHER): Payer: BC Managed Care – PPO

## 2016-12-06 ENCOUNTER — Telehealth: Payer: Self-pay | Admitting: Adult Health

## 2016-12-06 ENCOUNTER — Ambulatory Visit (HOSPITAL_BASED_OUTPATIENT_CLINIC_OR_DEPARTMENT_OTHER): Payer: BC Managed Care – PPO

## 2016-12-06 ENCOUNTER — Other Ambulatory Visit: Payer: Self-pay | Admitting: *Deleted

## 2016-12-06 ENCOUNTER — Ambulatory Visit: Payer: BC Managed Care – PPO

## 2016-12-06 ENCOUNTER — Telehealth: Payer: Self-pay | Admitting: Oncology

## 2016-12-06 ENCOUNTER — Ambulatory Visit (HOSPITAL_COMMUNITY)
Admission: RE | Admit: 2016-12-06 | Discharge: 2016-12-06 | Disposition: A | Discharge status: Home / Self Care | Payer: BC Managed Care – PPO | Source: Ambulatory Visit | Attending: Oncology | Admitting: Oncology

## 2016-12-06 ENCOUNTER — Encounter: Payer: Self-pay | Admitting: Adult Health

## 2016-12-06 ENCOUNTER — Ambulatory Visit (HOSPITAL_BASED_OUTPATIENT_CLINIC_OR_DEPARTMENT_OTHER): Payer: BC Managed Care – PPO | Admitting: Adult Health

## 2016-12-06 VITALS — BP 145/65 | HR 121 | Temp 98.9°F | Resp 18 | Ht 68.0 in | Wt 118.1 lb

## 2016-12-06 DIAGNOSIS — C7931 Secondary malignant neoplasm of brain: Secondary | ICD-10-CM

## 2016-12-06 DIAGNOSIS — C50411 Malignant neoplasm of upper-outer quadrant of right female breast: Secondary | ICD-10-CM

## 2016-12-06 DIAGNOSIS — C7951 Secondary malignant neoplasm of bone: Secondary | ICD-10-CM | POA: Diagnosis not present

## 2016-12-06 DIAGNOSIS — C773 Secondary and unspecified malignant neoplasm of axilla and upper limb lymph nodes: Secondary | ICD-10-CM

## 2016-12-06 DIAGNOSIS — D649 Anemia, unspecified: Secondary | ICD-10-CM | POA: Insufficient documentation

## 2016-12-06 DIAGNOSIS — Z170 Estrogen receptor positive status [ER+]: Secondary | ICD-10-CM

## 2016-12-06 DIAGNOSIS — C50919 Malignant neoplasm of unspecified site of unspecified female breast: Secondary | ICD-10-CM

## 2016-12-06 DIAGNOSIS — C787 Secondary malignant neoplasm of liver and intrahepatic bile duct: Secondary | ICD-10-CM

## 2016-12-06 DIAGNOSIS — K769 Liver disease, unspecified: Secondary | ICD-10-CM | POA: Diagnosis not present

## 2016-12-06 DIAGNOSIS — C50911 Malignant neoplasm of unspecified site of right female breast: Secondary | ICD-10-CM | POA: Diagnosis not present

## 2016-12-06 LAB — CBC WITH DIFFERENTIAL/PLATELET
BASO%: 0.3 % (ref 0.0–2.0)
BASOS ABS: 0 10*3/uL (ref 0.0–0.1)
EOS ABS: 0 10*3/uL (ref 0.0–0.5)
EOS%: 0.3 % (ref 0.0–7.0)
HEMATOCRIT: 20.1 % — AB (ref 34.8–46.6)
HGB: 6.3 g/dL — CL (ref 11.6–15.9)
LYMPH%: 34.3 % (ref 14.0–49.7)
MCH: 26.3 pg (ref 25.1–34.0)
MCHC: 31.3 g/dL — AB (ref 31.5–36.0)
MCV: 83.8 fL (ref 79.5–101.0)
MONO#: 0.2 10*3/uL (ref 0.1–0.9)
MONO%: 6.5 % (ref 0.0–14.0)
NEUT%: 58.6 % (ref 38.4–76.8)
NEUTROS ABS: 1.9 10*3/uL (ref 1.5–6.5)
PLATELETS: 28 10*3/uL — AB (ref 145–400)
RBC: 2.4 10*6/uL — ABNORMAL LOW (ref 3.70–5.45)
RDW: 16 % — ABNORMAL HIGH (ref 11.2–14.5)
WBC: 3.2 10*3/uL — AB (ref 3.9–10.3)
lymph#: 1.1 10*3/uL (ref 0.9–3.3)
nRBC: 2 % — ABNORMAL HIGH (ref 0–0)

## 2016-12-06 LAB — TECHNOLOGIST REVIEW: Technologist Review: 1

## 2016-12-06 LAB — COMPREHENSIVE METABOLIC PANEL
ALT: 45 U/L (ref 0–55)
AST: 58 U/L — AB (ref 5–34)
Albumin: 3.7 g/dL (ref 3.5–5.0)
Alkaline Phosphatase: 122 U/L (ref 40–150)
Anion Gap: 10 mEq/L (ref 3–11)
BUN: 13.4 mg/dL (ref 7.0–26.0)
CHLORIDE: 108 meq/L (ref 98–109)
CO2: 26 meq/L (ref 22–29)
Calcium: 9.3 mg/dL (ref 8.4–10.4)
Creatinine: 1.2 mg/dL — ABNORMAL HIGH (ref 0.6–1.1)
EGFR: >60 mL/min/{1.73_m2} (ref >60)
GLUCOSE: 107 mg/dL (ref 70–140)
POTASSIUM: 3.6 meq/L (ref 3.5–5.1)
SODIUM: 144 meq/L (ref 136–145)
Total Bilirubin: 0.34 mg/dL (ref 0.20–1.20)
Total Protein: 8.1 g/dL (ref 6.4–8.3)

## 2016-12-06 LAB — PREPARE RBC (CROSSMATCH)

## 2016-12-06 MED ORDER — DENOSUMAB 120 MG/1.7ML ~~LOC~~ SOLN
120.0000 mg | Freq: Once | SUBCUTANEOUS | Status: AC
  Administered 2016-12-06: 120 mg via SUBCUTANEOUS
  Filled 2016-12-06: qty 1.7

## 2016-12-06 NOTE — Progress Notes (Signed)
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 She is taking Anastrozole and Everolimus '10mg'$ .  She is tolerating it well.  She denies any issues with this.  She tells me that she is working with PT for her hip issues and now she is walking better.    REVIEW OF SYSTEMS: I reviewed with Clodagh that for the past couple of visits she has had an elevated HR of 121.  She does not have palpitations, chest pain, or shortness of breath with this.  Her pain is well controlled.  She is fatigued.  She had anemia last on 10/31 with a hemoglobin of 6.3 and received 2 units of PRBCs.  She tolerated that treatment well.  She underwent liver MRI on 11/26 and is here today to discuss those results.  She denies any other issues today and a detailed ROS is non contributory.     BREAST CANCER HISTORY: From doctor Khan's of original intake node 05/31/2011:  "Laken Rog is a 46 y.o. female. Without significant past medical history. She underwent a mammogram that showed calcifications measuring 5 cm on the right breast. She then went on To have an ultrasound which showed the area to measure 2.9 cm. She was also found to have a right axillary lymph node that was  suspicious for a malignancy. She had a biopsy of the calcifications that showed high-grade ductal carcinoma in situ. 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 In the lymph node biopsy there was no lymphatic tissue seen and was felt that the node was replaced by tumor. Patient went on to have an MRI of the bilateral breasts performed on evening of 05/30/2011. The MRI showed in the right breast 5 x 3 x 4.5 cm mass abutting the chest wall. About 7 cm away from this there was another mass in the upper inner quadrant that measured 1.5 x 1.7 x 1.5 cm. On the contralateral breast, that is the left breast a 2 cm area suspicious enhancement was noted also this up to date has not been biopsied and arrangements are being made for the biopsy to be performed. In this side there were no suspicious lymph nodes. The prognostic panel is pending. Patient is otherwise without any complaints. "  METASTATIC DISEASE: From the earlier summary note:  Dayanna noted some strange feelings around her umbilicus 20/94/7096. This felt like an area of numbness. Over the next 2 days she noted some leg weakness and difficulty walking, so she presented to the ED 04/12/2014. MRI of the thoraco-lumbar spine was obtained 04/12/2014 showing multiple bone lesions and compression fracture at T8 with retropulsion and cord compression. On 04/13/2014 she underwent Laminectomy T8 decompression of spinal cord posterior fixation from T6-T10 with pedicle screws and rods posterior arthrodesis with allograft. The pathology from this procedure ([id number]) showed  metastatic adenocarcinoma which was estrogen receptor 69% positive, with moderate staining intensity, progesterone receptor negative. HER-2 could not be obtained.  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 There were nonspecific L temporal bone changes  and also possible involvement of the clivus and calvarium, but no other parenchymal brain lesions. Further staging studies included a bone scan, which failed to show the lytic lesion seen on other scans, and CTs of the chest, abdomen and pelvis on 06/08/2011, which showed very small right lung and left liver lesions which will require follow-up  Her subsequent history is as detailed below   PAST MEDICAL HISTORY: Past Medical History:  Diagnosis Date  . Breast cancer ([place]) 09/2011   invasive ductal carcinoma metastatic ca in 3/14 lymph nodes  . History of chemotherapy 06/27/11 -09/04/11   neoadjuvant  . Hx of radiation therapy 11/08/11 -12/26/11   right chest wall/supraclav fossa, right scar  . S/P radiation therapy 05/08/14   SRS brain    PAST SURGICAL HISTORY: Past Surgical History:  Procedure Laterality Date  . brain surgery-LITT Left 11/20/14   LITT procedure for Lt brain met.  Marland Kitchen BREAST BIOPSY     Left  . CHOLECYSTECTOMY N/A 03/01/2014   Procedure: LAPAROSCOPIC CHOLECYSTECTOMY;  Surgeon: Leighton Ruff, MD;  Location: [facility];  Service: General;  Laterality: N/A;  . LAMINECTOMY N/A 04/13/2014   Procedure: THORACIC LAMINECTOMY WITH FIXATION THORACIC SIX-THORACIC TEN FUSION;  Surgeon: Kristeen Miss, MD;  Location: [facility];  Service: Neurosurgery;  Laterality: N/A;  . MODIFIED MASTECTOMY  10/03/2011   Procedure: MODIFIED MASTECTOMY;  Surgeon: Haywood Lasso, MD;  Location: [facility];  Service: General;  Laterality: Right;  . PORT-A-CATH REMOVAL  01/30/2012   Procedure: MINOR REMOVAL PORT-A-CATH;  Surgeon: Haywood Lasso, MD;  Location: [facility];  Service: General;  Laterality: Left;  . PORTACATH PLACEMENT  06/20/2011   Procedure: INSERTION PORT-A-CATH;  Surgeon: Haywood Lasso, MD;  Location: [facility];  Service: General;  Laterality: N/A;  . ROBOTIC ASSISTED TOTAL HYSTERECTOMY WITH BILATERAL SALPINGO OOPHERECTOMY Bilateral 12/23/2012   Procedure:  ROBOTIC ASSISTED TOTAL HYSTERECTOMY WITH BILATERAL SALPINGO OOPHORECTOMY;  Surgeon: Marvene Staff, MD;  Location: [facility];  Service: Gynecology;  Laterality: Bilateral;  . WISDOM TOOTH EXTRACTION      FAMILY HISTORY Family History  Problem Relation Age of Onset  . Breast cancer Maternal Aunt 75  . Pancreatic cancer Maternal Grandfather   . Pancreatic cancer Maternal Aunt        died in her 96s  . Leukemia Maternal Aunt        died in her 29s  . Ovarian cancer Cousin 34       maternal cousin   the patient's father is alive, currently 34 years old. The patient's mother died from complications of diabetes at the age of 40. The patient had no brothers, 4 sisters. One sister has died from congestive heart failure. The patient's mother is one of 5 sisters. One of them was diagnosed with breast cancer the age of 61. Also a cousin on the maternal side it was diagnosed with ovarian cancer. This was approximately age 74. There is also colon cancer in the family. The patient has had extensive genetic testing summarized below. She is however BRCA negative  GYNECOLOGIC HISTORY:  Patient's last menstrual period was 07/05/2011 (exact date). Menarche age 72, she is  GX P0. She underwent total hysterectomy with bilateral salpingo-oophorectomy December 2014.  SOCIAL HISTORY:  Bretta works at [employer] for [employer]. She mostly deals with government documents. She is single, lives by herself, with no pets. She attends [employer] locally.    ADVANCED DIRECTIVES: Not in place. The patient has a documents and intends to name her sister Ronnetta Currington as healthcare power of attorney. Joelene Millin lives in [place] and can be reached at [place]: Social History   Tobacco Use  . Smoking status: Never Smoker  . Smokeless tobacco: Never Used  Substance Use Topics  . Alcohol use: No  . Drug use: No     Colonoscopy:  PAP:  Bone density:  Lipid panel:  Allergies  Allergen  Reactions  . Allegra [Fexofenadine] Hives    Abdomen only  . Cat Hair Extract Other (See Comments)  . Shellfish Allergy Hives    Abdomen only  . Glucosamine Rash    Current Outpatient Medications  Medication Sig Dispense Refill  . anastrozole (ARIMIDEX) 1 MG tablet Take 1 tablet (1 mg total) by mouth daily. 90 tablet 4  . Cholecalciferol (VITAMIN D3) 2000 units TABS Take by mouth daily.    Marland Kitchen dexamethasone (DECADRON) 0.5 MG/5ML solution Swish 86m in mouth for 2 min and spit out. Use 4 times daily for 8 weeks at start of Afinitor. 500 mL 3  . everolimus (AFINITOR) 10 MG tablet Take 1 tablet (10 mg total) by mouth daily. 30 tablet 6  . gabapentin (NEURONTIN) 100 MG capsule Take 1 capsule (100 mg total) by mouth at bedtime. Take 100-'300mg'$  at night for neuropathy 90 capsule 0  . Multiple Vitamin (MULTIVITAMIN) tablet Take 1 tablet by mouth daily.    . ondansetron (ZOFRAN) 8 MG tablet Take 1 tablet (8 mg total) by mouth every 8 (eight) hours as needed for nausea or vomiting. 30 tablet 1  . polyethylene glycol (MIRALAX / GLYCOLAX) packet Take 17 g by mouth daily. 14 each 0  . traMADol (ULTRAM) 50 MG tablet Take 1 tablet (50 mg total) by mouth every 6 (six) hours as needed. 60 tablet 3   No current facility-administered medications for this visit.     OBJECTIVE:  Vitals:   12/06/16 0842  BP: (!) 145/65  Pulse: (!) 121  Resp: 18  Temp: 98.9 F (37.2 C)  SpO2: 97%     Body mass index is 17.96 kg/m.    ECOG FS:1 - Symptomatic but completely ambulatory  GENERAL: Patient is a well appearing female in no acute distress HEENT:  Sclerae anicteric.  Oropharynx clear and moist. No ulcerations or evidence of oropharyngeal candidiasis. Neck is supple.  NODES:  No cervical, supraclavicular, or axillary lymphadenopathy palpated.  BREAST EXAM:  Deferred. LUNGS:  Clear to auscultation bilaterally.  No wheezes or rhonchi. HEART:  Regular rhythm, tachycardic. No murmur appreciated. ABDOMEN:  Soft,  nontender.  Positive, normoactive bowel sounds. No organomegaly palpated. MSK:  No focal spinal tenderness to palpation. Full range of motion bilaterally in the upper extremities. EXTREMITIES:  No peripheral edema.   SKIN:  Pale.  Clear with no obvious rashes or skin changes. No nail dyscrasia. NEURO:  Nonfocal. Well oriented.  Appropriate affect.    LAB RESULTS:  CMP     Component Value Date/Time   NA 144 12/06/2016 0830   K 3.6 12/06/2016 0830   CL 105 09/13/2016 1043   CL 102 01/16/2012 0804   CO2 26 12/06/2016 0830  GLUCOSE 107 12/06/2016 0830   GLUCOSE 92 01/16/2012 0804   BUN 13.4 12/06/2016 0830   CREATININE 1.2 (H) 12/06/2016 0830   CALCIUM 9.3 12/06/2016 0830   PROT 8.1 12/06/2016 0830   ALBUMIN 3.7 12/06/2016 0830   AST 58 (H) 12/06/2016 0830   ALT 45 12/06/2016 0830   ALKPHOS 122 12/06/2016 0830   BILITOT 0.34 12/06/2016 0830   GFRNONAA 33 (L) 09/13/2016 1043   GFRAA 38 (L) 09/13/2016 1043    I No results found for: SPEP  Lab Results  Component Value Date   WBC 3.2 (L) 12/06/2016   NEUTROABS 1.9 12/06/2016   HGB 6.3 (LL) 12/06/2016   HCT 20.1 (L) 12/06/2016   MCV 83.8 12/06/2016   PLT 28 (L) 12/06/2016      Chemistry      Component Value Date/Time   NA 144 12/06/2016 0830   K 3.6 12/06/2016 0830   CL 105 09/13/2016 1043   CL 102 01/16/2012 0804   CO2 26 12/06/2016 0830   BUN 13.4 12/06/2016 0830   CREATININE 1.2 (H) 12/06/2016 0830      Component Value Date/Time   CALCIUM 9.3 12/06/2016 0830   ALKPHOS 122 12/06/2016 0830   AST 58 (H) 12/06/2016 0830   ALT 45 12/06/2016 0830   BILITOT 0.34 12/06/2016 0830       Lab Results  Component Value Date   LABCA2 24 05/31/2011    No components found for: [id number]  No results for input(s): INR in the last 168 hours.  Urinalysis    Component Value Date/Time   COLORURINE STRAW (A) 09/05/2016 2000    STUDIES: Mr Liver W Wo Contrast  Result Date: 12/04/2016 CLINICAL DATA:  Metastatic  breast cancer with small liver lesions for further characterization EXAM: MRI ABDOMEN WITHOUT AND WITH CONTRAST TECHNIQUE: Multiplanar multisequence MR imaging of the abdomen was performed both before and after the administration of intravenous contrast. CONTRAST:  53m MULTIHANCE GADOBENATE DIMEGLUMINE 529 MG/ML IV SOLN COMPARISON:  Multiple exams, including ultrasound of 10/12/2016 and CT chest from 04/06/2016. FINDINGS: Lower chest: New small right pleural effusion. Essentially stable 7 mm right lower lobe pulmonary nodule adjacent to the diaphragm on image 28/902. Hepatobiliary: In addition to some scattered small cysts in the liver which are chronic, there are numerous scattered enhancing lesions in the liver, many of which demonstrate either rim enhancement or complete enhancement in the early arterial phase and generally with delayed enhancement observed. Dominant lesion 1.5 by 1.3 cm on image 20/900. These lesions have high precontrast T2 signal characteristics and are sharply defined. Pancreas:  Unremarkable Spleen:  Unremarkable Adrenals/Urinary Tract:  Unremarkable Stomach/Bowel: Unremarkable Vascular/Lymphatic:  Unremarkable Other:  Right mastectomy. Musculoskeletal: Heterogeneous marrow suspicious for widespread osseous metastatic disease. Lower paraspinal muscular edema, without a focal mass in the paraspinal musculature. Left lower anterior rib fracture or rib metastatic lesion. IMPRESSION: 1. Widespread osseous metastatic disease in the visualized skeleton. Stable 7 mm right lower lobe pulmonary nodule. 2. 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 Given the large number of lesions, metastatic disease is more likely although hemangiomatosis could produce a similar appearance. Nuclear medicine PET-CT could be used to differentiate if this affects therapy. These lesions are  much less conspicuous on CT although the dominant lesion in the dome of the right hepatic lobe was noted on the CT from 04/06/2016. 3. New small right pleural effusion. 4. Left lower anterior rib fracture,  possibly pathologic. 5. Lower paraspinal muscular edema especially in the vicinity of suspected prior radiation therapy in the lower lumbar spine, may be a result of prior radiation therapy. Electronically Signed   By: Van Clines M.D.   On: 12/04/2016 10:10   Mm Screening Breast Tomo Uni L  Result Date: 11/07/2016 CLINICAL DATA:  Screening. EXAM: 2D DIGITAL SCREENING UNILATERAL LEFT MAMMOGRAM WITH CAD AND ADJUNCT TOMO COMPARISON:  Previous exam(s). ACR Breast Density Category d: The breast tissue is extremely dense, which lowers the sensitivity of mammography. FINDINGS: The patient has had a right mastectomy. There are no findings suspicious for malignancy. Images were processed with CAD. IMPRESSION: No mammographic evidence of malignancy. A result letter of this screening mammogram will be mailed directly to the patient. RECOMMENDATION: Screening mammogram in one year.  (Code:SM-L-15M) BI-RADS CATEGORY  1: Negative. Electronically Signed   By: Margarette Canada M.D.   On: 11/07/2016 14:28    ASSESSMENT: 46 y.o. BRCA negative [place] woman status post right breast upper outer quadrant and right axillary lymph node biopsy 05/18/2011, both positive for an invasive ductal carcinoma, high-grade, clinicallyT2 N1-2 or stage IIB/IIIA,  estrogen receptor 100% positive, progesterone receptor 87% positive, with an MIB-1 of 14% and no HER-2 amplification ([id number]).  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   (a) VUS were also found in [place] and BARD1  (2) additional right breast biopsy upper inner quadrant 06/08/2011 showed only a fibroadenoma, and central left breast biopsy for another suspicious lesion showed only fibrocystic changes (SAA  [phone] and [id number]).   (3)Treated neoadjuvantly with cyclophosphamide and docetaxel x4 completed 08/29/2011.  (4) status post right modified radical mastectomy 10/03/2011 showing a residual pT1c pN1a (3/18 lymph nodes positive) invasive ductal carcinoma, grade 1,estrogen receptor 100% positive, progesterone receptor negative, with no HER-2 amplification ([id number] [id number])  (5) adjuvant radiation to the right chest wall, right supraclavicular fossa and right scar completed 12/26/2011  (6) tamoxifen started January 2014-- discontinued April 2016 with evidence of metastatic disease  (7) status post total hysterectomy with bilateral salpingo-oophorectomy 12/23/2012 with benign pathology ([id number])  METASTATIC DISEASE 04/13/2014 (8) presented with T8 cord compression and underwent laminectomy 04/13/2014 with T8 decompression of spinal cord, posterior fixation from T6-T10 with pedicle screws and rods, posterior arthrodesis with allograft. 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 ExacTrac Snap verification was performed for each couch angle.   (b) radiation to the T-spine completed 05/08/2014.  (c) [facility]  (d) SRS to 0.4 cm left parietal lesion 12/23/2015  (11) started fulvestrant 05/18/2014 and Palbociclib 06/01/2014  (a) Palbociclib dose decreased to 100 mg daily, 21/7 as of 08/05/2014  (b) palbociclib dose decreased to 75 mg daily, 21/7, starting May 2017  (c) palbociclib  dose decreased to 75 mg every other day beginning 07/26/2015    (d) palbociclib held for month of October because of persistent low counts, resumed November  (e) fulvestrant discontinued after 11/02/2016 dose: Anastrozole started 08/01/2016    (12) started zolendronate 05/18/2014,  repeated every 12 weeks  (a) July zolendronate dose held because of dental issues, resumed 10/18/2015  (b) switched to denosumab as of 01/20/2016 with concern of bony progression on zolendronate  (c) switching back to zolendronate as of April 2018 at patient's request  (d) switched to denosumab/Xgeva because of hypercalcemia, first dose 09/13/2016  (13) most recent staging studies:  (a) brain MRI 07/13/2016 shows stable to smaller treated lesions.  (b) bone scan 04/06/2016 shows no new lesions  (c) chest CT scan 04/06/2016 shows small bilateral pulmonary nodules with no evidence of disease progression  (d) MRI of the thoracolumbar spine shows diffuse bony metastases particularly involving T8, T11 and T12, but without epidural disease. There were subcentimeter pulmonary nodules and indeterminate liver lesions. CT scan of the left hip 09/06/2016 showed multiple bony metastases but no fracture  (14) anastrozole resumed 10/20/2016   (a) everolimus started 09/25/2016  (15) palliative radiation to the right hip completed 09/26/2016    PLAN: Sherald is doing pretty well today considering her hemoglobin is 6.3 and her platelets are 28.  She will stop the Everolimus.  She will receive 2 units of PRBCs today.  I reviewed her labs and the MRI of the abdomen with Dr. [name].  She will undergo PET/CT scan and f/u with him for the results on 12/7.  We simply cannot determine whether her liver lesions are cancerous or not with the MRI, and need the PET scan to further evaluate.  We will also check her labs more frequently, now weekly.  I reviewed this plan with Crislyn in detail.  She knows to call for any questions or concerns  prior to her next appointment with [place].     A total of (30) minutes of face-to-face time was spent with this patient with greater than 50% of that time in counseling and care-coordination.   Wilber Bihari, NP  12/06/16 9:37 AM Medical Oncology and Hematology [facility] [address] [place], [place] [address] Tel. [phone]    Fax. [phone]

## 2016-12-06 NOTE — Patient Instructions (Signed)

## 2016-12-06 NOTE — Telephone Encounter (Signed)
Gave patient AVS and calendar of upcoming December appointments.  °

## 2016-12-06 NOTE — Telephone Encounter (Signed)
Called peer to peer for Carolyn [name] for PET scan due to Liver MRI results.  Called [phone].  Spoke with Beckie Busing, followed by a nurse reviewer who did not give her name, in addition to Dr. Jeannetta Ellis.  Authorization number is [id number] valid today through Jun 08, 2017.  Spent a total of 12 minutes on the phone coordinating this.     Wilber Bihari, NP

## 2016-12-07 ENCOUNTER — Encounter: Payer: BC Managed Care – PPO | Admitting: Physical Therapy

## 2016-12-07 ENCOUNTER — Ambulatory Visit (HOSPITAL_COMMUNITY)
Admission: RE | Admit: 2016-12-07 | Discharge: 2016-12-07 | Disposition: A | Discharge status: Home / Self Care | Payer: BC Managed Care – PPO | Source: Ambulatory Visit | Attending: Adult Health | Admitting: Adult Health

## 2016-12-07 DIAGNOSIS — C7931 Secondary malignant neoplasm of brain: Principal | ICD-10-CM

## 2016-12-07 DIAGNOSIS — D649 Anemia, unspecified: Secondary | ICD-10-CM

## 2016-12-07 DIAGNOSIS — C50911 Malignant neoplasm of unspecified site of right female breast: Secondary | ICD-10-CM

## 2016-12-07 MED ORDER — SODIUM CHLORIDE 0.9% FLUSH: 10.0000 mL | INTRAVENOUS | Status: DC | PRN

## 2016-12-07 MED ORDER — HEPARIN SOD (PORK) LOCK FLUSH 100 UNIT/ML IV SOLN: 250.0000 [IU] | INTRAVENOUS | Status: DC | PRN

## 2016-12-07 MED ORDER — DIPHENHYDRAMINE HCL 25 MG PO CAPS
25.0000 mg | ORAL_CAPSULE | Freq: Once | ORAL | Status: AC
  Administered 2016-12-07: 25 mg via ORAL
  Filled 2016-12-07: qty 1

## 2016-12-07 MED ORDER — HEPARIN SOD (PORK) LOCK FLUSH 100 UNIT/ML IV SOLN: 500.0000 [IU] | Freq: Every day | INTRAVENOUS | Status: DC | PRN

## 2016-12-07 MED ORDER — SODIUM CHLORIDE 0.9% FLUSH
3.0000 mL | INTRAVENOUS | Status: DC | PRN
Start: 2016-12-07 — End: 2016-12-08

## 2016-12-07 MED ORDER — SODIUM CHLORIDE 0.9 % IV SOLN: 250.0000 mL | Freq: Once | INTRAVENOUS | Status: DC

## 2016-12-07 MED ORDER — ACETAMINOPHEN 325 MG PO TABS
650.0000 mg | ORAL_TABLET | Freq: Once | ORAL | Status: AC
  Administered 2016-12-07: 650 mg via ORAL
  Filled 2016-12-07: qty 2

## 2016-12-07 NOTE — Discharge Instructions (Signed)
Blood Transfusion, Adult, Care After This sheet gives you information about how to care for yourself after your procedure. Your health care provider may also give you more specific instructions. If you have problems or questions, contact your health care provider. What can I expect after the procedure? After your procedure, it is common to have:  Bruising and soreness where the IV tube was inserted.  Headache.  Follow these instructions at home:  Take over-the-counter and prescription medicines only as told by your health care provider.  Return to your normal activities as told by your health care provider.  Follow instructions from your health care provider about how to take care of your IV insertion site. Make sure you: ? Wash your hands with soap and water before you change your bandage (dressing). If soap and water are not available, use hand sanitizer. ? Change your dressing as told by your health care provider.  Check your IV insertion site every day for signs of infection. Check for: ? More redness, swelling, or pain. ? More fluid or blood. ? Warmth. ? Pus or a bad smell. Contact a health care provider if:  You have more redness, swelling, or pain around the IV insertion site.  You have more fluid or blood coming from the IV insertion site.  Your IV insertion site feels warm to the touch.  You have pus or a bad smell coming from the IV insertion site.  Your urine turns pink, red, or brown.  You feel weak after doing your normal activities. Get help right away if:  You have signs of a serious allergic or immune system reaction, including: ? Itchiness. ? Hives. ? Trouble breathing. ? Anxiety. ? Chest or lower back pain. ? Fever, flushing, and chills. ? Rapid pulse. ? Rash. ? Diarrhea. ? Vomiting. ? Dark urine. ? Serious headache. ? Dizziness. ? Stiff neck. ? Yellow coloration of the face or the white parts of the eyes (jaundice). This information is not  intended to replace advice given to you by your health care provider. Make sure you discuss any questions you have with your health care provider. Document Released: 01/16/2014 Document Revised: 08/25/2015 Document Reviewed: 07/12/2015 Elsevier Interactive Patient Education  2018 [employer] Inc[employer]  

## 2016-12-07 NOTE — Progress Notes (Signed)
PATIENT CARE CENTER NOTE  Diagnosis: Anemia   Provider: Clovis Riley   Procedure: 2 Units RBC's   Note: Patient received 2 units of blood. Patient tolerated transfusion well with no complications.  Discharge instructions given to patient. Patient alert, oriented and ambulatory at discharge.

## 2016-12-08 ENCOUNTER — Encounter (HOSPITAL_COMMUNITY)
Admission: RE | Admit: 2016-12-08 | Discharge: 2016-12-08 | Disposition: A | Discharge status: Home / Self Care | Payer: BC Managed Care – PPO | Source: Ambulatory Visit | Attending: Adult Health | Admitting: Adult Health

## 2016-12-08 DIAGNOSIS — C787 Secondary malignant neoplasm of liver and intrahepatic bile duct: Secondary | ICD-10-CM | POA: Insufficient documentation

## 2016-12-08 LAB — TYPE AND SCREEN
ABO/RH(D): B POS
Antibody Screen: NEGATIVE
UNIT DIVISION: 0
Unit division: 0

## 2016-12-08 LAB — BPAM RBC
Blood Product Expiration Date: 201812172359
Blood Product Expiration Date: 201812182359
ISSUE DATE / TIME: 201811290827
ISSUE DATE / TIME: 201811290827
UNIT TYPE AND RH: 7300
Unit Type and Rh: 7300

## 2016-12-08 LAB — GLUCOSE, CAPILLARY: Glucose-Capillary: 93 mg/dL (ref 65–99)

## 2016-12-08 MED ORDER — FLUDEOXYGLUCOSE F - 18 (FDG) INJECTION
5.8800 | Freq: Once | INTRAVENOUS | Status: AC | PRN
  Administered 2016-12-08: 5.88 via INTRAVENOUS

## 2016-12-11 NOTE — Progress Notes (Signed)
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  Most recently she has been on anastrozole and everolimus for her peripheral disease, but there has been evidence of disease progression, with new involvement of the liver and worsening involvement of the bones.  She has had significant cytopenias because of the bone marrow involvement and this has required transfusions.  Since her last visit to the office, she has completed a PET scan on 12/08/2016 with results showing: There is low-level hypermetabolic activity within the enhancing lesion in the dome of the right hepatic lobe, consistent with metastasis. There is also heterogeneous hypermetabolic activity throughoutthe axial and proximal appendicular skeleton, corresponding with chronic osseous metastatic disease. No other evidence of metabolically active metastatic disease.    REVIEW OF SYSTEMS: Trace continues to work full-time.  Since having her blood transfusion she has had more appetite and has felt better.  She denies unusual headaches visual changes nausea vomiting or falls.  She continues to use a cane for ambulation.  She has had no cough phlegm production or pleurisy.  He has been minimally constipated, but  takes MiraLAX daily.  She denies pain.  Detailed review of systems today was otherwise stable   BREAST CANCER HISTORY: From doctor Khan's of original intake node 05/31/2011:  "Carolyn [name] is a 46 y.o. female. Without significant past medical history. She underwent a mammogram that showed calcifications measuring 5 cm on the right breast. She then went on To have an ultrasound which showed the area to measure 2.9 cm. She was also found to have a right axillary lymph node that was suspicious for a malignancy. She had a biopsy of the calcifications that showed high-grade ductal carcinoma in situ. 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 In the lymph node biopsy there was no lymphatic tissue seen and was felt that the node was replaced by tumor. Patient went on to have an MRI of the bilateral breasts performed on evening of 05/30/2011. The MRI showed in the right breast 5 x 3 x 4.5 cm mass abutting the chest wall. About 7 cm away from this there was another mass in the upper inner quadrant that measured 1.5 x 1.7 x 1.5 cm. On the contralateral breast, that is the left breast a 2 cm area suspicious enhancement was noted also this up to date has not been biopsied and arrangements are being made for the biopsy to be performed. In this side there were no suspicious lymph nodes. The prognostic panel is pending. Patient is otherwise without any complaints. "  METASTATIC DISEASE: From the earlier summary note:  Carolyn [name] noted some strange feelings around her umbilicus 54/56/2563. This felt like an area of numbness. Over the next 2 days she noted some leg weakness and difficulty walking, so she presented to the ED 04/12/2014. MRI of the  thoraco-lumbar spine was obtained 04/12/2014 showing multiple bone lesions and compression fracture at T8 with retropulsion and cord compression. On 04/13/2014 she underwent Laminectomy T8 decompression of spinal cord posterior fixation from  T6-T10 with pedicle screws and rods posterior arthrodesis with allograft. The pathology from this procedure ([id number] [id number]) showed metastatic adenocarcinoma which was estrogen receptor 69% positive, with moderate staining intensity, progesterone receptor negative. HER-2 could not be obtained.  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 There were nonspecific L temporal bone changes and also possible involvement of the clivus and calvarium, but no other parenchymal brain lesions. Further staging studies included a bone scan, which failed to show the lytic lesion seen on other scans, and CTs of the chest, abdomen and pelvis on 06/08/2011, which showed very small right lung and left liver lesions which will require follow-up  Her subsequent history is as detailed below   PAST MEDICAL HISTORY: Past Medical History:  Diagnosis Date  . Breast cancer ([place]) 09/2011   invasive ductal carcinoma metastatic ca in 3/14 lymph nodes  . History of chemotherapy 06/27/11 -09/04/11   neoadjuvant  . Hx of radiation therapy 11/08/11 -12/26/11   right chest wall/supraclav fossa, right scar  . S/P radiation therapy 05/08/14   SRS brain    PAST SURGICAL HISTORY: Past Surgical History:  Procedure Laterality Date  . brain surgery-LITT Left 11/20/14   LITT procedure for Lt brain met.  Marland Kitchen BREAST BIOPSY     Left  . CHOLECYSTECTOMY N/A 03/01/2014   Procedure: LAPAROSCOPIC CHOLECYSTECTOMY;  Surgeon: Leighton Ruff, MD;  Location: [facility];  Service: General;  Laterality: N/A;  . LAMINECTOMY N/A 04/13/2014   Procedure: THORACIC LAMINECTOMY WITH FIXATION THORACIC SIX-THORACIC TEN FUSION;  Surgeon: Kristeen Miss, MD;  Location: [facility];  Service: Neurosurgery;  Laterality: N/A;  . MODIFIED MASTECTOMY  10/03/2011   Procedure: MODIFIED MASTECTOMY;  Surgeon: Haywood Lasso, MD;  Location: [facility];  Service:  General;  Laterality: Right;  . PORT-A-CATH REMOVAL  01/30/2012   Procedure: MINOR REMOVAL PORT-A-CATH;  Surgeon: Haywood Lasso, MD;  Location: [facility];  Service: General;  Laterality: Left;  . PORTACATH PLACEMENT  06/20/2011   Procedure: INSERTION PORT-A-CATH;  Surgeon: Haywood Lasso, MD;  Location: [facility];  Service: General;  Laterality: N/A;  . ROBOTIC ASSISTED TOTAL HYSTERECTOMY WITH BILATERAL SALPINGO OOPHERECTOMY Bilateral 12/23/2012   Procedure: ROBOTIC ASSISTED TOTAL HYSTERECTOMY WITH BILATERAL SALPINGO OOPHORECTOMY;  Surgeon: Marvene Staff, MD;  Location: [facility];  Service: Gynecology;  Laterality: Bilateral;  . WISDOM TOOTH EXTRACTION      FAMILY HISTORY Family History  Problem Relation Age of Onset  . Breast cancer Maternal Aunt 22  . Pancreatic cancer Maternal Grandfather   . Pancreatic cancer Maternal Aunt        died in her 70s  . Leukemia Maternal Aunt        died in her 35s  . Ovarian cancer Cousin 58       maternal cousin   the patient's father is alive, currently 81 years old. The patient's mother died from complications of diabetes at the age of 64. The patient had no brothers, 4 sisters. One sister has died from congestive heart failure. The patient's mother is one of 5 sisters. One of them was diagnosed with breast cancer the age of 77. Also a cousin on the maternal side  it was diagnosed with ovarian cancer. This was approximately age 63. There is also [name] cancer in the family. The patient has had extensive genetic testing summarized below. She is however BRCA negative  GYNECOLOGIC HISTORY:  Patient's last menstrual period was 07/05/2011 (exact date). Menarche age 34, she is GX P0. She underwent total hysterectomy with bilateral salpingo-oophorectomy December 2014.  SOCIAL HISTORY:  Gerda works at [employer] for [employer]. She mostly deals with government documents. She is single, lives by herself, with no pets. She attends [employer]  locally.    ADVANCED DIRECTIVES: Not in place. The patient has a documents and intends to name her sister Kamea Dacosta as healthcare power of attorney. Joelene Millin lives in [place] and can be reached at [place]: Social History   Tobacco Use  . Smoking status: Never Smoker  . Smokeless tobacco: Never Used  Substance Use Topics  . Alcohol use: No  . Drug use: No     Colonoscopy:  PAP:  Bone density:  Lipid panel:  Allergies  Allergen Reactions  . Allegra [Fexofenadine] Hives    Abdomen only  . Cat Hair Extract Other (See Comments)  . Shellfish Allergy Hives    Abdomen only  . Glucosamine Rash    Current Outpatient Medications  Medication Sig Dispense Refill  . anastrozole (ARIMIDEX) 1 MG tablet Take 1 tablet (1 mg total) by mouth daily. 90 tablet 4  . Cholecalciferol (VITAMIN D3) 2000 units TABS Take by mouth daily.    Marland Kitchen dexamethasone (DECADRON) 0.5 MG/5ML solution Swish 74m in mouth for 2 min and spit out. Use 4 times daily for 8 weeks at start of Afinitor. 500 mL 3  . everolimus (AFINITOR) 10 MG tablet Take 1 tablet (10 mg total) by mouth daily. 30 tablet 6  . gabapentin (NEURONTIN) 100 MG capsule Take 1 capsule (100 mg total) by mouth at bedtime. Take 100-3047mat night for neuropathy 90 capsule 0  . Multiple Vitamin (MULTIVITAMIN) tablet Take 1 tablet by mouth daily.    . ondansetron (ZOFRAN) 8 MG tablet Take 1 tablet (8 mg total) by mouth every 8 (eight) hours as needed for nausea or vomiting. 30 tablet 1  . polyethylene glycol (MIRALAX / GLYCOLAX) packet Take 17 g by mouth daily. 14 each 0  . traMADol (ULTRAM) 50 MG tablet Take 1 tablet (50 mg total) by mouth every 6 (six) hours as needed. 60 tablet 3   No current facility-administered medications for this visit.     OBJECTIVE: Young appearing African-American woman using a cane to ambulate  Vitals:   12/13/16 0816  BP: (!) 142/68  Pulse: (!) 110  Resp: 18  Temp: 98.4 F (36.9 C)    SpO2: 97%     Body mass index is 18.23 kg/m.    ECOG FS:1 - Symptomatic but completely ambulatory   Sclerae unicteric, EOMs intact Oropharynx clear and moist No cervical or supraclavicular adenopathy Lungs no rales or rhonchi Heart regular rate and rhythm Abd soft, nontender, positive bowel sounds MSK no focal spinal tenderness, no upper extremity lymphedema Neuro: well oriented, appropriate affect Breasts: Deferred    LAB RESULTS:  CMP     Component Value Date/Time   NA 144 12/06/2016 0830   K 3.6 12/06/2016 0830   CL 105 09/13/2016 1043   CL 102 01/16/2012 0804   CO2 26 12/06/2016 0830   GLUCOSE 107 12/06/2016 0830   GLUCOSE 92 01/16/2012 0804   BUN 13.4 12/06/2016 0830  CREATININE 1.2 (H) 12/06/2016 0830   CALCIUM 9.3 12/06/2016 0830   PROT 8.1 12/06/2016 0830   ALBUMIN 3.7 12/06/2016 0830   AST 58 (H) 12/06/2016 0830   ALT 45 12/06/2016 0830   ALKPHOS 122 12/06/2016 0830   BILITOT 0.34 12/06/2016 0830   GFRNONAA 33 (L) 09/13/2016 1043   GFRAA 38 (L) 09/13/2016 1043    I No results found for: SPEP  Lab Results  Component Value Date   WBC 3.5 (L) 12/13/2016   NEUTROABS 2.2 12/13/2016   HGB 8.7 (L) 12/13/2016   HCT 27.1 (L) 12/13/2016   MCV 86.0 12/13/2016   PLT 32 (L) 12/13/2016      Chemistry      Component Value Date/Time   NA 144 12/06/2016 0830   K 3.6 12/06/2016 0830   CL 105 09/13/2016 1043   CL 102 01/16/2012 0804   CO2 26 12/06/2016 0830   BUN 13.4 12/06/2016 0830   CREATININE 1.2 (H) 12/06/2016 0830      Component Value Date/Time   CALCIUM 9.3 12/06/2016 0830   ALKPHOS 122 12/06/2016 0830   AST 58 (H) 12/06/2016 0830   ALT 45 12/06/2016 0830   BILITOT 0.34 12/06/2016 0830       Lab Results  Component Value Date   LABCA2 24 05/31/2011    No components found for: [id number]  No results for input(s): INR in the last 168 hours.  Urinalysis    Component Value Date/Time   COLORURINE STRAW (A) 09/05/2016 2000     STUDIES: Mr Liver W Wo Contrast  Result Date: 12/04/2016 CLINICAL DATA:  Metastatic breast cancer with small liver lesions for further characterization EXAM: MRI ABDOMEN WITHOUT AND WITH CONTRAST TECHNIQUE: Multiplanar multisequence MR imaging of the abdomen was performed both before and after the administration of intravenous contrast. CONTRAST:  8m MULTIHANCE GADOBENATE DIMEGLUMINE 529 MG/ML IV SOLN COMPARISON:  Multiple exams, including ultrasound of 10/12/2016 and CT chest from 04/06/2016. FINDINGS: Lower chest: New small right pleural effusion. Essentially stable 7 mm right lower lobe pulmonary nodule adjacent to the diaphragm on image 28/902. Hepatobiliary: In addition to some scattered small cysts in the liver which are chronic, there are numerous scattered enhancing lesions in the liver, many of which demonstrate either rim enhancement or complete enhancement in the early arterial phase and generally with delayed enhancement observed. Dominant lesion 1.5 by 1.3 cm on image 20/900. These lesions have high precontrast T2 signal characteristics and are sharply defined. Pancreas:  Unremarkable Spleen:  Unremarkable Adrenals/Urinary Tract:  Unremarkable Stomach/Bowel: Unremarkable Vascular/Lymphatic:  Unremarkable Other:  Right mastectomy. Musculoskeletal: Heterogeneous marrow suspicious for widespread osseous metastatic disease. Lower paraspinal muscular edema, without a focal mass in the paraspinal musculature. Left lower anterior rib fracture or rib metastatic lesion. IMPRESSION: 1. Widespread osseous metastatic disease in the visualized skeleton. Stable 7 mm right lower lobe pulmonary nodule. 2. 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 Given the large number of lesions, metastatic disease is more likely although hemangiomatosis could produce a similar appearance.  Nuclear medicine PET-CT could be used to differentiate if this affects therapy. These lesions are much less conspicuous on CT although the dominant lesion in the dome of the right hepatic lobe was noted on the CT from 04/06/2016. 3. New small right pleural effusion. 4. Left lower anterior rib fracture, possibly pathologic. 5. Lower paraspinal muscular edema especially in the vicinity of suspected prior radiation therapy in the  lower lumbar spine, may be a result of prior radiation therapy. Electronically Signed   By: Van Clines M.D.   On: 12/04/2016 10:10   Nm Pet Image Restag (ps) Skull Base To Thigh  Result Date: 12/08/2016 CLINICAL DATA:  Subsequent treatment strategy for metastatic breast cancer. EXAM: NUCLEAR MEDICINE PET SKULL BASE TO THIGH TECHNIQUE: 5.88 mCi F-18 FDG was injected intravenously. Full-ring PET imaging was performed from the skull base to thigh after the radiotracer. CT data was obtained and used for attenuation correction and anatomic localization. FASTING BLOOD GLUCOSE:  Value: 93 mg/dl COMPARISON:  PET-CT 06/08/2011, chest CT 04/06/2016, bone scan 04/06/2016 and abdominal MRI 12/04/2016 FINDINGS: NECK No hypermetabolic cervical lymph nodes are identified.There are no lesions of the pharyngeal mucosal space. CHEST There are no hypermetabolic mediastinal, hilar, axillary or internal mammary lymph nodes. Surgical clips are present in the right axilla. There is no suspicious pulmonary activity. There are new small bilateral pleural effusions. Asymmetric scarring at the right lung apex appears stable. No new or enlarging pulmonary nodules are seen. ABDOMEN/PELVIS There is a small hypermetabolic lesion at the dome of the right hepatic lobe, corresponding with the lesion on recent MRI. This is not well seen on the CT images, although measures approximately 14 mm on CT image 96. This has an SUV max of 3.9. The hepatic activity is otherwise normal. Scattered hepatic cysts are again  noted. There is no abnormal metabolic activity within the spleen, pancreas or adrenal glands. There is no hypermetabolic nodal activity. There is trace pelvic ascites. SKELETON Patient has widespread mixed lytic and blastic metastatic disease status post lower thoracic fusion. There is heterogeneous mildly increased metabolic activity throughout the axial and proximal appendicular skeleton. For example, in the left sacrum, there is hypermetabolic activity with an SUV max of 5.0. SUV max at T11 is 4.3 and at L3 is 5.1. IMPRESSION: 1. There is low-level hypermetabolic activity within the enhancing lesion in the dome of the right hepatic lobe, consistent with metastasis. 2. There is also heterogeneous hypermetabolic activity throughout the axial and proximal appendicular skeleton, corresponding with chronic osseous metastatic disease. 3. No other evidence of metabolically active metastatic disease. Electronically Signed   By: Richardean Sale M.D.   On: 12/08/2016 11:43    ASSESSMENT: 46 y.o. BRCA negative [place] woman status post right breast upper outer quadrant and right axillary lymph node biopsy 05/18/2011, both positive for an invasive ductal carcinoma, high-grade, clinicallyT2 N1-2 or stage IIB/IIIA,  estrogen receptor 100% positive, progesterone receptor 87% positive, with an MIB-1 of 14% and no HER-2 amplification ([id number]).  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   (a) VUS were also found in [place] and BARD1  (2) additional right breast biopsy upper inner quadrant 06/08/2011 showed only a fibroadenoma, and central left breast biopsy for another suspicious lesion showed only fibrocystic changes ([id number] [id number] and [id number]).   (3)Treated neoadjuvantly with cyclophosphamide and docetaxel x4 completed 08/29/2011.  (4) status post right modified radical mastectomy 10/03/2011 showing a residual pT1c pN1a (3/18 lymph nodes positive)  invasive ductal carcinoma, grade 1,estrogen receptor 100% positive, progesterone receptor negative, with no HER-2 amplification ([id number])  (5) adjuvant radiation to the right chest wall, right supraclavicular fossa and right scar completed 12/26/2011  (6) tamoxifen started January 2014-- discontinued April 2016 with evidence of metastatic disease  (7) status post total hysterectomy with bilateral salpingo-oophorectomy 12/23/2012 with benign pathology ([id number])  METASTATIC DISEASE 04/13/2014 (8)  presented with T8 cord compression and underwent laminectomy 04/13/2014 with T8 decompression of spinal cord, posterior fixation from T6-T10 with pedicle screws and rods, posterior arthrodesis with allograft. 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 ExacTrac Snap verification was performed for each couch angle.   (b) radiation to the T-spine completed 05/08/2014.  (c) [facility]  (d) SRS to 0.4 cm left parietal lesion 12/23/2015  (11) started fulvestrant 05/18/2014 and Palbociclib 06/01/2014  (a) Palbociclib dose decreased to 100 mg daily, 21/7 as of 08/05/2014  (b) palbociclib dose decreased to 75 mg daily, 21/7, starting May 2017  (c) palbociclib dose decreased to 75 mg every other day beginning 07/26/2015    (d) palbociclib held for month of October because of persistent low counts, resumed November  (e) fulvestrant discontinued after 11/02/2016 dose: Anastrozole started  08/01/2016    (12) started zolendronate 05/18/2014, repeated every 12 weeks  (a) July zolendronate dose held because of dental issues, resumed 10/18/2015  (b) switched to denosumab as of 01/20/2016 with concern of bony progression on zolendronate  (c) switching back to zolendronate as of April 2018 at patient's request  (d) switched to denosumab/Xgeva because of hypercalcemia, first dose 09/13/2016  (13) most recent staging studies:  (a) brain MRI 07/13/2016 shows stable to smaller treated lesions.  (b) bone scan 04/06/2016 shows no new lesions  (c) chest CT scan 04/06/2016 shows small bilateral pulmonary nodules with no evidence of disease progression  (d) MRI of the thoracolumbar spine shows diffuse bony metastases particularly involving T8, T11 and T12, but without epidural disease. There were subcentimeter pulmonary nodules and indeterminate liver lesions. CT scan of the left hip 09/06/2016 showed multiple bony metastases but no fracture  (14) anastrozole resumed 10/20/2016, discontinued 12/13/2016  (a) everolimus started 09/25/2016, discontinued 12/06/2016  (15) palliative radiation to the right hip completed 09/26/2016  (16) to start Doxil chemotherapy 12/20/2016  (a) baseline echocardiogram pending    PLAN: I spent a little over 30 minutes today with WeBhaktioing over her situation.  She understands she has metastatic disease and that it is not curable, nevertheless she remains amazingly functional and is not having any pain.  We have exhausted the various antiestrogen permutations as her cancer has been able to grow right through them.  She is desirous of further treatment which means chemotherapy.  We have many agents that we could use.  Given the fact that she has significant bone marrow involvement and cytopenias I think Doxil may be a good choice for her.  We discussed the possible toxicities, side effects and complications of this agent including problems with mucositis,  neuropathy, and weakening of the heart muscle.  She is very eager to start.  At this point she prefers not to have a port placed.  We will try to get her the first cycle on 12/20/2016 if we can get an echocardiogram before then.  She may need significant transfusion support over the next several months while we wait for this chemotherapy, which is not expected to have an immediate effect to  work.  The plan would be for her to receive 3-4 cycles of Doxil and then restage with an MRI of the liver and likely repeat PET scan  She is also of course scheduled for a repeat brain MRI mid January 2019.  Trafton Roker, Virgie Dad, MD  12/13/16 8:36 AM Medical Oncology and Hematology [facility] [address] [place], [place] [address] Tel. [phone]    Fax. [phone]  This document serves as a record of services personally performed by Lurline Del, MD. It was created on his behalf by Sheron Nightingale, a trained medical scribe. The creation of this record is based on the scribe's personal observations and the provider's statements to them.

## 2016-12-13 ENCOUNTER — Ambulatory Visit (HOSPITAL_BASED_OUTPATIENT_CLINIC_OR_DEPARTMENT_OTHER): Payer: BC Managed Care – PPO | Admitting: Oncology

## 2016-12-13 ENCOUNTER — Other Ambulatory Visit: Payer: Self-pay | Admitting: *Deleted

## 2016-12-13 ENCOUNTER — Other Ambulatory Visit (HOSPITAL_BASED_OUTPATIENT_CLINIC_OR_DEPARTMENT_OTHER): Payer: BC Managed Care – PPO

## 2016-12-13 ENCOUNTER — Telehealth: Payer: Self-pay | Admitting: Oncology

## 2016-12-13 VITALS — BP 142/68 | HR 110 | Temp 98.4°F | Resp 18 | Ht 68.0 in | Wt 119.9 lb

## 2016-12-13 DIAGNOSIS — C50411 Malignant neoplasm of upper-outer quadrant of right female breast: Secondary | ICD-10-CM

## 2016-12-13 DIAGNOSIS — C7951 Secondary malignant neoplasm of bone: Secondary | ICD-10-CM | POA: Diagnosis not present

## 2016-12-13 DIAGNOSIS — D759 Disease of blood and blood-forming organs, unspecified: Secondary | ICD-10-CM

## 2016-12-13 DIAGNOSIS — C773 Secondary and unspecified malignant neoplasm of axilla and upper limb lymph nodes: Secondary | ICD-10-CM | POA: Diagnosis not present

## 2016-12-13 DIAGNOSIS — C787 Secondary malignant neoplasm of liver and intrahepatic bile duct: Secondary | ICD-10-CM

## 2016-12-13 DIAGNOSIS — C50919 Malignant neoplasm of unspecified site of unspecified female breast: Secondary | ICD-10-CM

## 2016-12-13 DIAGNOSIS — C7931 Secondary malignant neoplasm of brain: Secondary | ICD-10-CM | POA: Diagnosis not present

## 2016-12-13 DIAGNOSIS — Z170 Estrogen receptor positive status [ER+]: Secondary | ICD-10-CM

## 2016-12-13 LAB — COMPREHENSIVE METABOLIC PANEL
ALT: 26 U/L (ref 0–55)
ANION GAP: 11 meq/L (ref 3–11)
AST: 38 U/L — ABNORMAL HIGH (ref 5–34)
Albumin: 3.3 g/dL — ABNORMAL LOW (ref 3.5–5.0)
Alkaline Phosphatase: 124 U/L (ref 40–150)
BUN: 15.5 mg/dL (ref 7.0–26.0)
CALCIUM: 9.9 mg/dL (ref 8.4–10.4)
CHLORIDE: 106 meq/L (ref 98–109)
CO2: 27 meq/L (ref 22–29)
CREATININE: 1.2 mg/dL — AB (ref 0.6–1.1)
Glucose: 106 mg/dl (ref 70–140)
Potassium: 4.2 mEq/L (ref 3.5–5.1)
Sodium: 143 mEq/L (ref 136–145)
Total Bilirubin: 0.49 mg/dL (ref 0.20–1.20)
Total Protein: 7.4 g/dL (ref 6.4–8.3)

## 2016-12-13 LAB — CBC WITH DIFFERENTIAL/PLATELET
BASO%: 0.9 % (ref 0.0–2.0)
BASOS ABS: 0 10*3/uL (ref 0.0–0.1)
EOS ABS: 0 10*3/uL (ref 0.0–0.5)
EOS%: 0.9 % (ref 0.0–7.0)
HEMATOCRIT: 27.1 % — AB (ref 34.8–46.6)
HGB: 8.7 g/dL — ABNORMAL LOW (ref 11.6–15.9)
LYMPH%: 27.5 % (ref 14.0–49.7)
MCH: 27.6 pg (ref 25.1–34.0)
MCHC: 32.1 g/dL (ref 31.5–36.0)
MCV: 86 fL (ref 79.5–101.0)
MONO#: 0.3 10*3/uL (ref 0.1–0.9)
MONO%: 8.1 % (ref 0.0–14.0)
NEUT%: 62.6 % (ref 38.4–76.8)
NEUTROS ABS: 2.2 10*3/uL (ref 1.5–6.5)
PLATELETS: 32 10*3/uL — AB (ref 145–400)
RBC: 3.15 10*6/uL — AB (ref 3.70–5.45)
RDW: 15.3 % — ABNORMAL HIGH (ref 11.2–14.5)
WBC: 3.5 10*3/uL — ABNORMAL LOW (ref 3.9–10.3)
lymph#: 1 10*3/uL (ref 0.9–3.3)
nRBC: 2 % — ABNORMAL HIGH (ref 0–0)

## 2016-12-13 LAB — TECHNOLOGIST REVIEW

## 2016-12-13 MED ORDER — PROCHLORPERAZINE MALEATE 10 MG PO TABS
10.0000 mg | ORAL_TABLET | Freq: Four times a day (QID) | ORAL | 1 refills | Status: DC | PRN
Start: 2016-12-13 — End: 2017-03-22

## 2016-12-13 NOTE — Progress Notes (Signed)
START OFF PATHWAY REGIMEN - Breast   [id number]:Liposomal Doxorubicin (Doxil) 40 mg/m2 q28 Days:   A cycle is every 28 days:     Liposomal doxorubicin   **Always confirm dose/schedule in your pharmacy ordering system**    Patient Characteristics: Metastatic Chemotherapy, HER2 Negative/Unknown/Equivocal, ER Positive, First Line Therapeutic Status: Distant Metastases BRCA Mutation Status: Did Not Order Test ER Status: Positive (+) HER2 Status: Negative (-) Would you be surprised if this patient died  in the next year<= I would NOT be surprised if this patient died in the next year PR Status: Positive (+) Line of therapy: First Line Intent of Therapy: Non-Curative / Palliative Intent, Discussed with Patient

## 2016-12-13 NOTE — Telephone Encounter (Signed)
Left message for patient regarding upcoming December appointments.  °

## 2016-12-13 NOTE — Progress Notes (Signed)
Patient on plan of care prior to pathways. 

## 2016-12-14 ENCOUNTER — Other Ambulatory Visit (HOSPITAL_COMMUNITY): Payer: BC Managed Care – PPO

## 2016-12-19 ENCOUNTER — Other Ambulatory Visit (HOSPITAL_COMMUNITY): Payer: BC Managed Care – PPO

## 2016-12-20 ENCOUNTER — Other Ambulatory Visit: Payer: BC Managed Care – PPO

## 2016-12-20 ENCOUNTER — Ambulatory Visit: Payer: BC Managed Care – PPO

## 2016-12-20 ENCOUNTER — Telehealth: Payer: Self-pay | Admitting: Oncology

## 2016-12-20 ENCOUNTER — Ambulatory Visit: Payer: BC Managed Care – PPO | Admitting: Adult Health

## 2016-12-20 NOTE — Progress Notes (Deleted)
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      REVIEW OF SYSTEMS: Myia continues to work full-time.     BREAST CANCER HISTORY: From doctor Khan's of original intake node 05/31/2011:  "Jamariyah Johannsen is a 46 y.o. female. Without significant past medical history. She underwent a mammogram that showed calcifications measuring 5 cm on the right breast. She then went on To have an ultrasound which showed the area to measure 2.9 cm. She was also found to have a right axillary lymph node that was suspicious for a malignancy. She had a biopsy of the calcifications that showed high-grade ductal carcinoma in situ. 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 In the lymph node biopsy there was no lymphatic tissue seen and was felt that the node was replaced by tumor. Patient went on to have an MRI of the bilateral breasts performed on evening of 05/30/2011. The MRI showed in the right breast 5 x 3 x 4.5 cm mass abutting the chest wall. About 7 cm away from this there was another mass in the upper inner quadrant that measured 1.5 x 1.7 x 1.5 cm. On the contralateral breast, that is the left breast a 2 cm area suspicious  enhancement was noted also this up to date has not been biopsied and arrangements are being made for the biopsy to be performed. In this side there were no suspicious lymph nodes. The prognostic panel is pending. Patient is otherwise without any complaints. "  METASTATIC DISEASE: From the earlier summary note:  Lannette noted some strange feelings around her umbilicus 33/82/5053. This felt like an area of numbness. Over the next 2 days she noted some leg weakness and difficulty walking, so she presented to the ED 04/12/2014. MRI of the thoraco-lumbar spine was obtained 04/12/2014 showing multiple bone lesions and compression fracture at T8 with retropulsion and cord compression. On 04/13/2014 she underwent Laminectomy T8 decompression of spinal cord posterior fixation from T6-T10 with pedicle screws and rods posterior arthrodesis with allograft. 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 HER-2 could not be obtained.  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 There were nonspecific L temporal bone changes and also possible involvement of the clivus and calvarium, but no other parenchymal brain lesions. Further staging studies included a bone scan, which failed to show the lytic lesion seen on other scans, and CTs of the chest, abdomen and pelvis on 06/08/2011, which showed very small right lung and left liver lesions which will require follow-up  Her subsequent history is as detailed below   PAST MEDICAL HISTORY: Past Medical History:  Diagnosis Date  .  Breast cancer ([facility]) 09/2011   invasive ductal carcinoma metastatic ca in 3/14 lymph nodes  . History of chemotherapy 06/27/11 -09/04/11   neoadjuvant  . Hx of radiation therapy 11/08/11 -12/26/11   right  chest wall/supraclav fossa, right scar  . S/P radiation therapy 05/08/14   SRS brain    PAST SURGICAL HISTORY: Past Surgical History:  Procedure Laterality Date  . brain surgery-LITT Left 11/20/14   LITT procedure for Lt brain met.  Marland Kitchen BREAST BIOPSY     Left  . CHOLECYSTECTOMY N/A 03/01/2014   Procedure: LAPAROSCOPIC CHOLECYSTECTOMY;  Surgeon: Leighton Ruff, MD;  Location: [facility];  Service: General;  Laterality: N/A;  . LAMINECTOMY N/A 04/13/2014   Procedure: THORACIC LAMINECTOMY WITH FIXATION THORACIC SIX-THORACIC TEN FUSION;  Surgeon: Kristeen Miss, MD;  Location: [facility];  Service: Neurosurgery;  Laterality: N/A;  . MODIFIED MASTECTOMY  10/03/2011   Procedure: MODIFIED MASTECTOMY;  Surgeon: Haywood Lasso, MD;  Location: [facility];  Service: General;  Laterality: Right;  . PORT-A-CATH REMOVAL  01/30/2012   Procedure: MINOR REMOVAL PORT-A-CATH;  Surgeon: Haywood Lasso, MD;  Location: [facility];  Service: General;  Laterality: Left;  . PORTACATH PLACEMENT  06/20/2011   Procedure: INSERTION PORT-A-CATH;  Surgeon: Haywood Lasso, MD;  Location: [facility];  Service: General;  Laterality: N/A;  . ROBOTIC ASSISTED TOTAL HYSTERECTOMY WITH BILATERAL SALPINGO OOPHERECTOMY Bilateral 12/23/2012   Procedure: ROBOTIC ASSISTED TOTAL HYSTERECTOMY WITH BILATERAL SALPINGO OOPHORECTOMY;  Surgeon: Marvene Staff, MD;  Location: [facility];  Service: Gynecology;  Laterality: Bilateral;  . WISDOM TOOTH EXTRACTION      FAMILY HISTORY Family History  Problem Relation Age of Onset  . Breast cancer Maternal Aunt 36  . Pancreatic cancer Maternal Grandfather   . Pancreatic cancer Maternal Aunt        died in her 49s  . Leukemia Maternal Aunt        died in her 16s  . Ovarian cancer Cousin 110       maternal cousin   the patient's father is alive, currently 69 years old. The patient's mother died from complications of diabetes at the age of 79. The patient had no  brothers, 4 sisters. One sister has died from congestive heart failure. The patient's mother is one of 5 sisters. One of them was diagnosed with breast cancer the age of 31. Also a cousin on the maternal side it was diagnosed with ovarian cancer. This was approximately age 62. There is also colon cancer in the family. The patient has had extensive genetic testing summarized below. She is however BRCA negative  GYNECOLOGIC HISTORY:  Patient's last menstrual period was 07/05/2011 (exact date). Menarche age 26, she is GX P0. She underwent total hysterectomy with bilateral salpingo-oophorectomy December 2014.  SOCIAL HISTORY:  Ayssa works at [employer] for [employer]. She mostly deals with government documents. She is single, lives by herself, with no pets. She attends [employer] locally.    ADVANCED DIRECTIVES: Not in place. The patient has a documents and intends to name her sister Faelynn Wynder as healthcare power of attorney. Joelene Millin lives in [place] and can be reached at [place]: Social History   Tobacco Use  . Smoking status: Never Smoker  . Smokeless tobacco: Never Used  Substance Use Topics  . Alcohol use: No  . Drug use: No     Colonoscopy:  PAP:  Bone density:  Lipid panel:  Allergies  Allergen  Reactions  . Allegra [Fexofenadine] Hives    Abdomen only  . Cat Hair Extract Other (See Comments)  . Shellfish Allergy Hives    Abdomen only  . Glucosamine Rash    Current Outpatient Medications  Medication Sig Dispense Refill  . anastrozole (ARIMIDEX) 1 MG tablet Take 1 tablet (1 mg total) by mouth daily. 90 tablet 4  . Cholecalciferol (VITAMIN D3) 2000 units TABS Take by mouth daily.    Marland Kitchen dexamethasone (DECADRON) 0.5 MG/5ML solution Swish 51m in mouth for 2 min and spit out. Use 4 times daily for 8 weeks at start of Afinitor. 500 mL 3  . everolimus (AFINITOR) 10 MG tablet Take 1 tablet (10 mg total) by mouth daily. 30 tablet 6  . gabapentin  (NEURONTIN) 100 MG capsule Take 1 capsule (100 mg total) by mouth at bedtime. Take 100-3083mat night for neuropathy 90 capsule 0  . Multiple Vitamin (MULTIVITAMIN) tablet Take 1 tablet by mouth daily.    . ondansetron (ZOFRAN) 8 MG tablet Take 1 tablet (8 mg total) by mouth every 8 (eight) hours as needed for nausea or vomiting. 30 tablet 1  . polyethylene glycol (MIRALAX / GLYCOLAX) packet Take 17 g by mouth daily. 14 each 0  . prochlorperazine (COMPAZINE) 10 MG tablet Take 1 tablet (10 mg total) by mouth every 6 (six) hours as needed (Nausea or vomiting). 30 tablet 1  . traMADol (ULTRAM) 50 MG tablet Take 1 tablet (50 mg total) by mouth every 6 (six) hours as needed. 60 tablet 3   No current facility-administered medications for this visit.     OBJECTIVE:  There were no vitals filed for this visit.   There is no height or weight on file to calculate BMI.    ECOG FS:1 - Symptomatic but completely ambulatory  GENERAL: Patient is a well appearing female in no acute distress HEENT:  Sclerae anicteric.  Oropharynx clear and moist. No ulcerations or evidence of oropharyngeal candidiasis. Neck is supple.  NODES:  No cervical, supraclavicular, or axillary lymphadenopathy palpated.  BREAST EXAM:  Deferred. LUNGS:  Clear to auscultation bilaterally.  No wheezes or rhonchi. HEART:  Regular rate and rhythm. No murmur appreciated. ABDOMEN:  Soft, nontender.  Positive, normoactive bowel sounds. No organomegaly palpated. MSK:  No focal spinal tenderness to palpation. Full range of motion bilaterally in the upper extremities. EXTREMITIES:  No peripheral edema.   SKIN:  Clear with no obvious rashes or skin changes. No nail dyscrasia. NEURO:  Nonfocal. Well oriented.  Appropriate affect.     LAB RESULTS:  CMP     Component Value Date/Time   NA 143 12/13/2016 0747   K 4.2 12/13/2016 0747   CL 105 09/13/2016 1043   CL 102 01/16/2012 0804   CO2 27 12/13/2016 0747   GLUCOSE 106 12/13/2016 0747    GLUCOSE 92 01/16/2012 0804   BUN 15.5 12/13/2016 0747   CREATININE 1.2 (H) 12/13/2016 0747   CALCIUM 9.9 12/13/2016 0747   PROT 7.4 12/13/2016 0747   ALBUMIN 3.3 (L) 12/13/2016 0747   AST 38 (H) 12/13/2016 0747   ALT 26 12/13/2016 0747   ALKPHOS 124 12/13/2016 0747   BILITOT 0.49 12/13/2016 0747   GFRNONAA 33 (L) 09/13/2016 1043   GFRAA 38 (L) 09/13/2016 1043    I No results found for: SPEP  Lab Results  Component Value Date   WBC 3.5 (L) 12/13/2016   NEUTROABS 2.2 12/13/2016   HGB 8.7 (L) 12/13/2016   HCT 27.1 (L)  12/13/2016   MCV 86.0 12/13/2016   PLT 32 (L) 12/13/2016      Chemistry      Component Value Date/Time   NA 143 12/13/2016 0747   K 4.2 12/13/2016 0747   CL 105 09/13/2016 1043   CL 102 01/16/2012 0804   CO2 27 12/13/2016 0747   BUN 15.5 12/13/2016 0747   CREATININE 1.2 (H) 12/13/2016 0747      Component Value Date/Time   CALCIUM 9.9 12/13/2016 0747   ALKPHOS 124 12/13/2016 0747   AST 38 (H) 12/13/2016 0747   ALT 26 12/13/2016 0747   BILITOT 0.49 12/13/2016 0747       Lab Results  Component Value Date   LABCA2 24 05/31/2011    No components found for: [id number]  No results for input(s): INR in the last 168 hours.  Urinalysis    Component Value Date/Time   COLORURINE STRAW (A) 09/05/2016 2000    STUDIES: Mr Liver W Wo Contrast  Result Date: 12/04/2016 CLINICAL DATA:  Metastatic breast cancer with small liver lesions for further characterization EXAM: MRI ABDOMEN WITHOUT AND WITH CONTRAST TECHNIQUE: Multiplanar multisequence MR imaging of the abdomen was performed both before and after the administration of intravenous contrast. CONTRAST:  83m MULTIHANCE GADOBENATE DIMEGLUMINE 529 MG/ML IV SOLN COMPARISON:  Multiple exams, including ultrasound of 10/12/2016 and CT chest from 04/06/2016. FINDINGS: Lower chest: New small right pleural effusion. Essentially stable 7 mm right lower lobe pulmonary nodule adjacent to the diaphragm on image 28/902.  Hepatobiliary: In addition to some scattered small cysts in the liver which are chronic, there are numerous scattered enhancing lesions in the liver, many of which demonstrate either rim enhancement or complete enhancement in the early arterial phase and generally with delayed enhancement observed. Dominant lesion 1.5 by 1.3 cm on image 20/900. These lesions have high precontrast T2 signal characteristics and are sharply defined. Pancreas:  Unremarkable Spleen:  Unremarkable Adrenals/Urinary Tract:  Unremarkable Stomach/Bowel: Unremarkable Vascular/Lymphatic:  Unremarkable Other:  Right mastectomy. Musculoskeletal: Heterogeneous marrow suspicious for widespread osseous metastatic disease. Lower paraspinal muscular edema, without a focal mass in the paraspinal musculature. Left lower anterior rib fracture or rib metastatic lesion. IMPRESSION: 1. Widespread osseous metastatic disease in the visualized skeleton. Stable 7 mm right lower lobe pulmonary nodule. 2. 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 Given the large number of lesions, metastatic disease is more likely although hemangiomatosis could produce a similar appearance. Nuclear medicine PET-CT could be used to differentiate if this affects therapy. These lesions are much less conspicuous on CT although the dominant lesion in the dome of the right hepatic lobe was noted on the CT from 04/06/2016. 3. New small right pleural effusion. 4. Left lower anterior rib fracture, possibly pathologic. 5. Lower paraspinal muscular edema especially in the vicinity of suspected prior radiation therapy in the lower lumbar spine, may be a result of prior radiation therapy. Electronically Signed   By: WVan ClinesM.D.   On: 12/04/2016 10:10   Nm Pet Image Restag (ps) Skull Base To Thigh  Result Date: 12/08/2016 CLINICAL DATA:  Subsequent  treatment strategy for metastatic breast cancer. EXAM: NUCLEAR MEDICINE PET SKULL BASE TO THIGH TECHNIQUE: 5.88 mCi F-18 FDG was injected intravenously. Full-ring PET imaging was performed from the skull base to thigh after the radiotracer. CT data was obtained and used for attenuation correction and anatomic localization. FASTING BLOOD GLUCOSE:  Value: 93 mg/dl COMPARISON:  PET-CT 06/08/2011, chest CT 04/06/2016, bone scan 04/06/2016 and abdominal MRI 12/04/2016 FINDINGS: NECK No hypermetabolic cervical lymph nodes are identified.There are no lesions of the pharyngeal mucosal space. CHEST There are no hypermetabolic mediastinal, hilar, axillary or internal mammary lymph nodes. Surgical clips are present in the right axilla. There is no suspicious pulmonary activity. There are new small bilateral pleural effusions. Asymmetric scarring at the right lung apex appears stable. No new or enlarging pulmonary nodules are seen. ABDOMEN/PELVIS There is a small hypermetabolic lesion at the dome of the right hepatic lobe, corresponding with the lesion on recent MRI. This is not well seen on the CT images, although measures approximately 14 mm on CT image 96. This has an SUV max of 3.9. The hepatic activity is otherwise normal. Scattered hepatic cysts are again noted. There is no abnormal metabolic activity within the spleen, pancreas or adrenal glands. There is no hypermetabolic nodal activity. There is trace pelvic ascites. SKELETON Patient has widespread mixed lytic and blastic metastatic disease status post lower thoracic fusion. There is heterogeneous mildly increased metabolic activity throughout the axial and proximal appendicular skeleton. For example, in the left sacrum, there is hypermetabolic activity with an SUV max of 5.0. SUV max at T11 is 4.3 and at L3 is 5.1. IMPRESSION: 1. There is low-level hypermetabolic activity within the enhancing lesion in the dome of the right hepatic lobe, consistent with metastasis. 2.  There is also heterogeneous hypermetabolic activity throughout the axial and proximal appendicular skeleton, corresponding with chronic osseous metastatic disease. 3. No other evidence of metabolically active metastatic disease. Electronically Signed   By: Richardean Sale M.D.   On: 12/08/2016 11:43    ASSESSMENT: 46 y.o. BRCA negative [place] woman status post right breast upper outer quadrant and right axillary lymph node biopsy 05/18/2011, both positive for an invasive ductal carcinoma, high-grade, clinicallyT2 N1-2 or stage IIB/IIIA,  estrogen receptor 100% positive, progesterone receptor 87% positive, with an MIB-1 of 14% and no HER-2 amplification ([id number]).  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   (a) VUS were also found in [place] and BARD1  (2) additional right breast biopsy upper inner quadrant 06/08/2011 showed only a fibroadenoma, and central left breast biopsy for another suspicious lesion showed only fibrocystic changes ([id number] and [id number]).   (3)Treated neoadjuvantly with cyclophosphamide and docetaxel x4 completed 08/29/2011.  (4) status post right modified radical mastectomy 10/03/2011 showing a residual pT1c pN1a (3/18 lymph nodes positive) invasive ductal carcinoma, grade 1,estrogen receptor 100% positive, progesterone receptor negative, with no HER-2 amplification ([id number])  (5) adjuvant radiation to the right chest wall, right supraclavicular fossa and right scar completed 12/26/2011  (6) tamoxifen started January 2014-- discontinued April 2016 with evidence of metastatic disease  (7) status post total hysterectomy with bilateral salpingo-oophorectomy 12/23/2012 with benign pathology ([id number])  METASTATIC DISEASE 04/13/2014 (8) presented with T8 cord compression and underwent laminectomy 04/13/2014 with T8 decompression of spinal cord, posterior fixation from T6-T10 with pedicle screws and  rods, posterior arthrodesis with allograft. 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 ExacTrac Snap verification was performed for each couch angle.   (b) radiation to the T-spine completed 05/08/2014.  (c) "Auto-LITT" procedure performed [facility] on 11/20/14  (d) SRS to 0.4 cm left parietal lesion 12/23/2015  (11) started fulvestrant 05/18/2014 and Palbociclib 06/01/2014  (a) Palbociclib dose decreased to 100 mg daily, 21/7 as of 08/05/2014  (b) palbociclib dose decreased to 75 mg daily, 21/7, starting May 2017  (c) palbociclib dose decreased to 75 mg every other day beginning 07/26/2015    (d) palbociclib held for month of October because of persistent low counts, resumed November  (e) fulvestrant discontinued after 11/02/2016 dose: Anastrozole started 08/01/2016    (12) started zolendronate 05/18/2014, repeated every 12 weeks  (a) July zolendronate dose held because of dental issues, resumed 10/18/2015  (b) switched to denosumab as of 01/20/2016 with concern of bony progression on zolendronate  (c) switching back to zolendronate as of April 2018 at patient's request  (d) switched to denosumab/Xgeva because of hypercalcemia, first dose 09/13/2016  (13) most recent staging studies:  (a) brain MRI 07/13/2016 shows stable to smaller treated lesions.  (b) bone scan 04/06/2016 shows no new lesions  (c) chest CT scan 04/06/2016 shows small bilateral pulmonary nodules with no evidence of disease progression  (d) MRI  of the thoracolumbar spine shows diffuse bony metastases particularly involving T8, T11 and T12, but without epidural disease. There were subcentimeter pulmonary nodules and indeterminate liver lesions. CT scan of the left hip 09/06/2016 showed multiple bony metastases but no fracture  (14) anastrozole resumed 10/20/2016, discontinued 12/13/2016  (a) everolimus started 09/25/2016, discontinued 12/06/2016  (15) palliative radiation to the right hip completed 09/26/2016  (16) to start Doxil chemotherapy 12/20/2016  (a) baseline echocardiogram pending    PLAN:  The plan would be for her to receive 3-4 cycles of Doxil and then restage with an MRI of the liver and likely repeat PET scan  She is also of course scheduled for a repeat brain MRI mid January 2019.  Wilber Bihari, NP  12/20/16 6:48 AM Medical Oncology and Hematology [facility] [address] [place], [place] [address] Tel. [phone]    Fax. [phone]

## 2016-12-20 NOTE — Telephone Encounter (Signed)
Scheduled appt per 12/7 sch msg - spoke with patient regarding appts.

## 2016-12-21 ENCOUNTER — Other Ambulatory Visit (HOSPITAL_COMMUNITY): Payer: BC Managed Care – PPO

## 2016-12-21 ENCOUNTER — Encounter (HOSPITAL_COMMUNITY): Payer: Self-pay

## 2016-12-21 ENCOUNTER — Telehealth: Payer: Self-pay | Admitting: Adult Health

## 2016-12-21 NOTE — Telephone Encounter (Signed)
Scheduled appt per 12/13 sch message - patient is aware of appt date and time.

## 2016-12-22 ENCOUNTER — Other Ambulatory Visit: Payer: BC Managed Care – PPO

## 2016-12-22 ENCOUNTER — Ambulatory Visit: Payer: BC Managed Care – PPO | Admitting: Adult Health

## 2016-12-22 ENCOUNTER — Ambulatory Visit (HOSPITAL_COMMUNITY): Payer: BC Managed Care – PPO | Attending: Internal Medicine

## 2016-12-22 ENCOUNTER — Ambulatory Visit: Payer: BC Managed Care – PPO

## 2016-12-22 ENCOUNTER — Other Ambulatory Visit: Payer: Self-pay

## 2016-12-22 DIAGNOSIS — C50919 Malignant neoplasm of unspecified site of unspecified female breast: Secondary | ICD-10-CM | POA: Diagnosis not present

## 2016-12-22 DIAGNOSIS — Z170 Estrogen receptor positive status [ER+]: Secondary | ICD-10-CM | POA: Insufficient documentation

## 2016-12-22 DIAGNOSIS — C50411 Malignant neoplasm of upper-outer quadrant of right female breast: Secondary | ICD-10-CM | POA: Insufficient documentation

## 2016-12-22 DIAGNOSIS — Z923 Personal history of irradiation: Secondary | ICD-10-CM | POA: Insufficient documentation

## 2016-12-22 DIAGNOSIS — C7951 Secondary malignant neoplasm of bone: Secondary | ICD-10-CM | POA: Diagnosis not present

## 2016-12-22 DIAGNOSIS — C7931 Secondary malignant neoplasm of brain: Secondary | ICD-10-CM | POA: Diagnosis not present

## 2016-12-22 DIAGNOSIS — Z9221 Personal history of antineoplastic chemotherapy: Secondary | ICD-10-CM | POA: Insufficient documentation

## 2016-12-25 ENCOUNTER — Ambulatory Visit: Payer: BC Managed Care – PPO

## 2016-12-25 ENCOUNTER — Ambulatory Visit (HOSPITAL_COMMUNITY)
Admission: RE | Admit: 2016-12-25 | Discharge: 2016-12-25 | Disposition: A | Discharge status: Home / Self Care | Payer: BC Managed Care – PPO | Source: Ambulatory Visit | Attending: Oncology | Admitting: Oncology

## 2016-12-25 ENCOUNTER — Other Ambulatory Visit (HOSPITAL_BASED_OUTPATIENT_CLINIC_OR_DEPARTMENT_OTHER): Payer: BC Managed Care – PPO

## 2016-12-25 ENCOUNTER — Ambulatory Visit (HOSPITAL_BASED_OUTPATIENT_CLINIC_OR_DEPARTMENT_OTHER): Payer: BC Managed Care – PPO

## 2016-12-25 ENCOUNTER — Telehealth: Payer: Self-pay | Admitting: Adult Health

## 2016-12-25 ENCOUNTER — Encounter: Payer: Self-pay | Admitting: Adult Health

## 2016-12-25 ENCOUNTER — Ambulatory Visit (HOSPITAL_BASED_OUTPATIENT_CLINIC_OR_DEPARTMENT_OTHER): Payer: BC Managed Care – PPO | Admitting: Adult Health

## 2016-12-25 ENCOUNTER — Telehealth: Payer: Self-pay

## 2016-12-25 ENCOUNTER — Ambulatory Visit: Payer: BC Managed Care – PPO | Admitting: Adult Health

## 2016-12-25 VITALS — BP 141/67 | HR 118 | Temp 97.8°F | Resp 18 | Ht 68.0 in | Wt 115.3 lb

## 2016-12-25 VITALS — BP 130/63 | HR 104 | Temp 98.9°F | Resp 17 | Ht 68.0 in

## 2016-12-25 DIAGNOSIS — C50411 Malignant neoplasm of upper-outer quadrant of right female breast: Secondary | ICD-10-CM | POA: Diagnosis not present

## 2016-12-25 DIAGNOSIS — C7951 Secondary malignant neoplasm of bone: Secondary | ICD-10-CM

## 2016-12-25 DIAGNOSIS — C7931 Secondary malignant neoplasm of brain: Secondary | ICD-10-CM | POA: Diagnosis not present

## 2016-12-25 DIAGNOSIS — C50911 Malignant neoplasm of unspecified site of right female breast: Secondary | ICD-10-CM | POA: Insufficient documentation

## 2016-12-25 DIAGNOSIS — K769 Liver disease, unspecified: Secondary | ICD-10-CM

## 2016-12-25 DIAGNOSIS — C773 Secondary and unspecified malignant neoplasm of axilla and upper limb lymph nodes: Secondary | ICD-10-CM | POA: Diagnosis not present

## 2016-12-25 DIAGNOSIS — D61818 Other pancytopenia: Secondary | ICD-10-CM

## 2016-12-25 DIAGNOSIS — C50919 Malignant neoplasm of unspecified site of unspecified female breast: Secondary | ICD-10-CM

## 2016-12-25 DIAGNOSIS — Z5111 Encounter for antineoplastic chemotherapy: Secondary | ICD-10-CM | POA: Diagnosis not present

## 2016-12-25 DIAGNOSIS — D649 Anemia, unspecified: Secondary | ICD-10-CM

## 2016-12-25 DIAGNOSIS — Z170 Estrogen receptor positive status [ER+]: Secondary | ICD-10-CM

## 2016-12-25 LAB — CBC WITH DIFFERENTIAL/PLATELET
BASO%: 0.4 % (ref 0.0–2.0)
BASOS ABS: 0 10*3/uL (ref 0.0–0.1)
EOS ABS: 0 10*3/uL (ref 0.0–0.5)
EOS%: 0.4 % (ref 0.0–7.0)
HCT: 21.3 % — ABNORMAL LOW (ref 34.8–46.6)
HEMOGLOBIN: 6.7 g/dL — AB (ref 11.6–15.9)
LYMPH#: 1.2 10*3/uL (ref 0.9–3.3)
LYMPH%: 25.8 % (ref 14.0–49.7)
MCH: 27.1 pg (ref 25.1–34.0)
MCHC: 31.5 g/dL (ref 31.5–36.0)
MCV: 86.2 fL (ref 79.5–101.0)
MONO#: 0.3 10*3/uL (ref 0.1–0.9)
MONO%: 6.7 % (ref 0.0–14.0)
NEUT#: 3.2 10*3/uL (ref 1.5–6.5)
NEUT%: 66.7 % (ref 38.4–76.8)
NRBC: 8 % — AB (ref 0–0)
PLATELETS: 36 10*3/uL — AB (ref 145–400)
RBC: 2.47 10*6/uL — ABNORMAL LOW (ref 3.70–5.45)
RDW: 15.8 % — AB (ref 11.2–14.5)
WBC: 4.8 10*3/uL (ref 3.9–10.3)

## 2016-12-25 LAB — COMPREHENSIVE METABOLIC PANEL
ALT: 23 U/L (ref 0–55)
ANION GAP: 13 meq/L — AB (ref 3–11)
AST: 32 U/L (ref 5–34)
Albumin: 3.5 g/dL (ref 3.5–5.0)
Alkaline Phosphatase: 152 U/L — ABNORMAL HIGH (ref 40–150)
BUN: 17.8 mg/dL (ref 7.0–26.0)
CHLORIDE: 106 meq/L (ref 98–109)
CO2: 23 meq/L (ref 22–29)
CREATININE: 1.4 mg/dL — AB (ref 0.6–1.1)
Calcium: 9.5 mg/dL (ref 8.4–10.4)
EGFR: 52 mL/min/{1.73_m2} — ABNORMAL LOW (ref >60)
Glucose: 125 mg/dl (ref 70–140)
Potassium: 4.2 mEq/L (ref 3.5–5.1)
SODIUM: 142 meq/L (ref 136–145)
Total Bilirubin: 0.49 mg/dL (ref 0.20–1.20)
Total Protein: 8.1 g/dL (ref 6.4–8.3)

## 2016-12-25 LAB — PREPARE RBC (CROSSMATCH)

## 2016-12-25 LAB — TECHNOLOGIST REVIEW

## 2016-12-25 MED ORDER — SODIUM CHLORIDE 0.9 % IV SOLN
250.0000 mL | Freq: Once | INTRAVENOUS | Status: AC
  Administered 2016-12-25: 250 mL via INTRAVENOUS

## 2016-12-25 MED ORDER — ACETAMINOPHEN 325 MG PO TABS
650.0000 mg | ORAL_TABLET | Freq: Once | ORAL | Status: AC
  Administered 2016-12-25: 650 mg via ORAL

## 2016-12-25 MED ORDER — DOXORUBICIN HCL LIPOSOMAL CHEMO INJECTION 2 MG/ML
40.0000 mg/m2 | Freq: Once | INTRAVENOUS | Status: AC
  Administered 2016-12-25: 64 mg via INTRAVENOUS
  Filled 2016-12-25: qty 7

## 2016-12-25 MED ORDER — DEXAMETHASONE SODIUM PHOSPHATE 10 MG/ML IJ SOLN
10.0000 mg | Freq: Once | INTRAMUSCULAR | Status: AC
  Administered 2016-12-25: 10 mg via INTRAVENOUS

## 2016-12-25 MED ORDER — DEXTROSE 5 % IV SOLN
Freq: Once | INTRAVENOUS | Status: AC
  Administered 2016-12-25: 10:00:00 via INTRAVENOUS

## 2016-12-25 MED ORDER — DIPHENHYDRAMINE HCL 25 MG PO CAPS
ORAL_CAPSULE | ORAL | Status: AC
Start: 2016-12-25 — End: ?
  Filled 2016-12-25: qty 1

## 2016-12-25 MED ORDER — ACETAMINOPHEN 325 MG PO TABS
ORAL_TABLET | ORAL | Status: AC
  Filled 2016-12-25: qty 2

## 2016-12-25 MED ORDER — DIPHENHYDRAMINE HCL 25 MG PO CAPS
25.0000 mg | ORAL_CAPSULE | Freq: Once | ORAL | Status: AC
  Administered 2016-12-25: 25 mg via ORAL

## 2016-12-25 MED ORDER — SODIUM CHLORIDE 0.9% FLUSH
3.0000 mL | INTRAVENOUS | Status: DC | PRN
  Filled 2016-12-25: qty 10

## 2016-12-25 MED ORDER — DEXAMETHASONE SODIUM PHOSPHATE 10 MG/ML IJ SOLN
INTRAMUSCULAR | Status: AC
  Filled 2016-12-25: qty 1

## 2016-12-25 NOTE — Patient Instructions (Addendum)
[facility] Discharge Instructions for Patients Receiving Chemotherapy  Today you received the following chemotherapy agents: Doxorubicin liposomal (Doxil).  To help prevent nausea and vomiting after your treatment, we encourage you to take your nausea medication as prescribed. If you develop nausea and vomiting that is not controlled by your nausea medication, call the clinic.   BELOW ARE SYMPTOMS THAT SHOULD BE REPORTED IMMEDIATELY:  *FEVER GREATER THAN 100.5 F  *CHILLS WITH OR WITHOUT FEVER  NAUSEA AND VOMITING THAT IS NOT CONTROLLED WITH YOUR NAUSEA MEDICATION  *UNUSUAL SHORTNESS OF BREATH  *UNUSUAL BRUISING OR BLEEDING  TENDERNESS IN MOUTH AND THROAT WITH OR WITHOUT PRESENCE OF ULCERS  *URINARY PROBLEMS  *BOWEL PROBLEMS  UNUSUAL RASH Items with * indicate a potential emergency and should be followed up as soon as possible.  Feel free to call the clinic should you have any questions or concerns. The clinic phone number is [phone].  Please show the [facility] at check-in to the Emergency Department and triage nurse.   Doxorubicin Liposomal injection What is this medicine? LIPOSOMAL DOXORUBICIN (LIP oh som al dox oh ROO bi sin) is a chemotherapy drug. This medicine is used to treat many kinds of cancer like Kaposi's sarcoma, multiple myeloma, and ovarian cancer. This medicine may be used for other purposes; ask your health care provider or pharmacist if you have questions. COMMON BRAND NAME(S): Doxil, Lipodox What should I tell my health care provider before I take this medicine? They need to know if you have any of these conditions: -blood disorders -heart disease -infection (especially a virus infection such as chickenpox, cold sores, or herpes) -liver disease -recent or ongoing radiation therapy -an unusual or allergic reaction to doxorubicin, other chemotherapy agents, soybeans, other medicines, foods, dyes, or preservatives -pregnant or  trying to get pregnant -breast-feeding How should I use this medicine? This drug is given as an infusion into a vein. It is administered in a hospital or clinic by a specially trained health care professional. If you have pain, swelling, burning or any unusual feeling around the site of your injection, tell your health care professional right away. Talk to your pediatrician regarding the use of this medicine in children. Special care may be needed. Overdosage: If you think you have taken too much of this medicine contact a poison control center or emergency room at once. NOTE: This medicine is only for you. Do not share this medicine with others. What if I miss a dose? It is important not to miss your dose. Call your doctor or health care professional if you are unable to keep an appointment. What may interact with this medicine? Do not take this medicine with any of the following medications: -zidovudine This medicine may also interact with the following medications: -medicines to increase blood counts like filgrastim, pegfilgrastim, sargramostim -vaccines Talk to your doctor or health care professional before taking any of these medicines: -acetaminophen -aspirin -ibuprofen -ketoprofen -naproxen This list may not describe all possible interactions. Give your health care provider a list of all the medicines, herbs, non-prescription drugs, or dietary supplements you use. Also tell them if you smoke, drink alcohol, or use illegal drugs. Some items may interact with your medicine. What should I watch for while using this medicine? Your condition will be monitored carefully while you are receiving this medicine. You will need important blood work done while you are taking this medicine. This drug may make you feel generally unwell. This is not uncommon, as chemotherapy  can affect healthy cells as well as cancer cells. Report any side effects. Continue your course of treatment even though you  feel ill unless your doctor tells you to stop. Your urine may turn orange-red for a few days after your dose. This is not blood. If your urine is dark or brown, call your doctor. In some cases, you may be given additional medicines to help with side effects. Follow all directions for their use. Call your doctor or health care professional for advice if you get a fever (100.5 degrees F or higher), chills or sore throat, or other symptoms of a cold or flu. Do not treat yourself. This drug decreases your body's ability to fight infections. Try to avoid being around people who are sick. This medicine may increase your risk to bruise or bleed. Call your doctor or health care professional if you notice any unusual bleeding. Be careful brushing and flossing your teeth or using a toothpick because you may get an infection or bleed more easily. If you have any dental work done, tell your dentist you are receiving this medicine. Avoid taking products that contain aspirin, acetaminophen, ibuprofen, naproxen, or ketoprofen unless instructed by your doctor. These medicines may hide a fever. Men and women of childbearing age should use effective birth control methods while using taking this medicine. Do not become pregnant while taking this medicine. There is a potential for serious side effects to an unborn child. Talk to your health care professional or pharmacist for more information. Do not breast-feed an infant while taking this medicine. Talk to your doctor about your risk of cancer. You may be more at risk for certain types of cancers if you take this medicine. What side effects may I notice from receiving this medicine? Side effects that you should report to your doctor or health care professional as soon as possible: -allergic reactions like skin rash, itching or hives, swelling of the face, lips, or tongue -low blood counts - this medicine may decrease the number of white blood cells, red blood cells and  platelets. You may be at increased risk for infections and bleeding. -signs of hand-foot syndrome - tingling or burning, redness, flaking, swelling, small blisters, or small sores on the palms of your hands or the soles of your feet -signs of infection - fever or chills, cough, sore throat, pain or difficulty passing urine -signs of decreased platelets or bleeding - bruising, pinpoint red spots on the skin, black, tarry stools, blood in the urine -signs of decreased red blood cells - unusually weak or tired, fainting spells, lightheadedness -back pain, chills, facial flushing, fever, headache, tightness in the chest or throat during the infusion -breathing problems -chest pain -fast, irregular heartbeat -mouth pain, redness, sores -pain, swelling, redness at site where injected -pain, tingling, numbness in the hands or feet -swelling of ankles, feet, or hands -vomiting Side effects that usually do not require medical attention (report to your doctor or health care professional if they continue or are bothersome): -diarrhea -hair loss -loss of appetite -nail discoloration or damage -nausea -red or watery eyes -red colored urine -stomach upset This list may not describe all possible side effects. Call your doctor for medical advice about side effects. You may report side effects to FDA at 1-800-FDA-1088. Where should I keep my medicine? This drug is given in a hospital or clinic and will not be stored at home. NOTE: This sheet is a summary. It may not cover all possible information. If you have  questions about this medicine, talk to your doctor, pharmacist, or health care provider.  2018 Elsevier/Gold Standard (2011-09-15 10:12:56)   Blood Transfusion, Care After This sheet gives you information about how to care for yourself after your procedure. Your doctor may also give you more specific instructions. If you have problems or questions, contact your doctor. Follow these instructions  at home:  Take over-the-counter and prescription medicines only as told by your doctor.  Go back to your normal activities as told by your doctor.  Follow instructions from your doctor about how to take care of the area where an IV tube was put into your vein (insertion site). Make sure you: ? Wash your hands with soap and water before you change your bandage (dressing). If there is no soap and water, use hand sanitizer. ? Change your bandage as told by your doctor.  Check your IV insertion site every day for signs of infection. Check for: ? More redness, swelling, or pain. ? More fluid or blood. ? Warmth. ? Pus or a bad smell. Contact a doctor if:  You have more redness, swelling, or pain around the IV insertion site..  You have more fluid or blood coming from the IV insertion site.  Your IV insertion site feels warm to the touch.  You have pus or a bad smell coming from the IV insertion site.  Your pee (urine) turns pink, red, or brown.  You feel weak after doing your normal activities. Get help right away if:  You have signs of a serious allergic or body defense (immune) system reaction, including: ? Itchiness. ? Hives. ? Trouble breathing. ? Anxiety. ? Pain in your chest or lower back. ? Fever, flushing, and chills. ? Fast pulse. ? Rash. ? Watery poop (diarrhea). ? Throwing up (vomiting). ? Dark pee. ? Serious headache. ? Dizziness. ? Stiff neck. ? Yellow color in your face or the white parts of your eyes (jaundice). Summary  After a blood transfusion, return to your normal activities as told by your doctor.  Every day, check for signs of infection where the IV tube was put into your vein.  Some signs of infection are warm skin, more redness and pain, more fluid or blood, and pus or a bad smell where the needle went in.  Contact your doctor if you feel weak or have any unusual symptoms. This information is not intended to replace advice given to you by your  health care provider. Make sure you discuss any questions you have with your health care provider. Document Released: 01/16/2014 Document Revised: 08/20/2015 Document Reviewed: 08/20/2015 Elsevier Interactive Patient Education  2017 [employer].

## 2016-12-25 NOTE — Telephone Encounter (Signed)
Gave patient avs with appts per 12/17 los.

## 2016-12-25 NOTE — Progress Notes (Signed)
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  She is due to begin Doxil [name].  She continues to have anemia.  She denies any symptoms related to this.  Her pain is controlled.    REVIEW OF SYSTEMS: Carolyn [name] is doing well.  She continues to work.  She is eating well, and wonders why she continues to lose weight.  She eats a very healthy diet and is conscientious of what she eats.  She denies any chest pain, shortness of breath, nausea, vomiting, diarrhea, constipation, easy bruising/bleeding, or any other concerns.    BREAST CANCER HISTORY: From doctor Khan's of original intake node 05/31/2011:  "Carolyn [name] is a 46 y.o. female. Without significant past medical history. She underwent a mammogram that showed calcifications measuring 5 cm on the right breast. She then went on To have an ultrasound which showed the area to measure 2.9 cm. She was also found to have a right axillary lymph node that was suspicious for a malignancy. She had a biopsy of the calcifications that showed high-grade ductal carcinoma in situ. 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 In the lymph node biopsy there was no lymphatic tissue seen and was  felt that the node was replaced by tumor. Patient went on to have an MRI of the bilateral breasts performed on evening of 05/30/2011. The MRI showed in the right breast 5 x 3 x 4.5 cm mass abutting the chest wall. About 7 cm away from this there was another mass in the upper inner quadrant that measured 1.5 x 1.7 x 1.5 cm. On the contralateral breast, that is the left breast a 2 cm area suspicious enhancement was noted also this up to date has not been biopsied and arrangements are being made for the biopsy to be performed. In this side there were no suspicious lymph nodes. The prognostic panel is pending. Patient is otherwise without any complaints. "  METASTATIC DISEASE: From the earlier summary note:  [name] noted some strange feelings around her umbilicus 53/29/9242. This felt like an area of numbness. Over the next 2 days she noted some leg weakness and difficulty walking, so she presented to the ED 04/12/2014. MRI of the thoraco-lumbar spine was obtained 04/12/2014 showing multiple bone lesions and compression fracture at T8 with retropulsion and cord compression. On 04/13/2014 she underwent Laminectomy T8 decompression of spinal cord posterior fixation from T6-T10 with pedicle screws and rods posterior arthrodesis with allograft. The pathology from this procedure (SZA [id number]) showed metastatic adenocarcinoma which was estrogen receptor 69% positive, with moderate staining intensity, progesterone receptor negative. HER-2 could not be obtained.  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 There were nonspecific L temporal bone changes and also  possible involvement of the clivus and calvarium, but no other parenchymal brain lesions. Further staging studies included a bone scan, which failed to show the lytic lesion seen on other scans, and CTs of the chest, abdomen and pelvis on 06/08/2011, which showed  very small right lung and left liver lesions which will require follow-up  Her subsequent history is as detailed below   PAST MEDICAL HISTORY: Past Medical History:  Diagnosis Date  . Breast cancer ([place]) 09/2011   invasive ductal carcinoma metastatic ca in 3/14 lymph nodes  . History of chemotherapy 06/27/11 -09/04/11   neoadjuvant  . Hx of radiation therapy 11/08/11 -12/26/11   right chest wall/supraclav fossa, right scar  . S/P radiation therapy 05/08/14   SRS brain    PAST SURGICAL HISTORY: Past Surgical History:  Procedure Laterality Date  . brain surgery-LITT Left 11/20/14   LITT procedure for Lt brain met.  Marland Kitchen BREAST BIOPSY     Left  . CHOLECYSTECTOMY N/A 03/01/2014   Procedure: LAPAROSCOPIC CHOLECYSTECTOMY;  Surgeon: Leighton Ruff, MD;  Location: [facility];  Service: General;  Laterality: N/A;  . LAMINECTOMY N/A 04/13/2014   Procedure: THORACIC LAMINECTOMY WITH FIXATION THORACIC SIX-THORACIC TEN FUSION;  Surgeon: Kristeen Miss, MD;  Location: [facility];  Service: Neurosurgery;  Laterality: N/A;  . MODIFIED MASTECTOMY  10/03/2011   Procedure: MODIFIED MASTECTOMY;  Surgeon: Haywood Lasso, MD;  Location: [place];  Service: General;  Laterality: Right;  . PORT-A-CATH REMOVAL  01/30/2012   Procedure: MINOR REMOVAL PORT-A-CATH;  Surgeon: Haywood Lasso, MD;  Location: [place];  Service: General;  Laterality: Left;  . PORTACATH PLACEMENT  06/20/2011   Procedure: INSERTION PORT-A-CATH;  Surgeon: Haywood Lasso, MD;  Location: [place];  Service: General;  Laterality: N/A;  . ROBOTIC ASSISTED TOTAL HYSTERECTOMY WITH BILATERAL SALPINGO OOPHERECTOMY Bilateral 12/23/2012   Procedure: ROBOTIC ASSISTED TOTAL HYSTERECTOMY WITH BILATERAL SALPINGO OOPHORECTOMY;  Surgeon: Marvene Staff, MD;  Location: [facility];  Service: Gynecology;  Laterality: Bilateral;  . WISDOM TOOTH EXTRACTION      FAMILY HISTORY Family History  Problem Relation Age of Onset   . Breast cancer Maternal Aunt 10  . Pancreatic cancer Maternal Grandfather   . Pancreatic cancer Maternal Aunt        died in her 44s  . Leukemia Maternal Aunt        died in her 11s  . Ovarian cancer Cousin 55       maternal cousin   the patient's father is alive, currently 64 years old. The patient's mother died from complications of diabetes at the age of 55. The patient had no brothers, 4 sisters. One sister has died from congestive heart failure. The patient's mother is one of 5 sisters. One of them was diagnosed with breast cancer the age of 87. Also a cousin on the maternal side it was diagnosed with ovarian cancer. This was approximately age 31. There is also colon cancer in the family. The patient has had extensive genetic testing summarized below. She is however BRCA negative  GYNECOLOGIC HISTORY:  Patient's last menstrual period was 07/05/2011 (exact date). Menarche age 45, she is GX P0. She underwent total hysterectomy with bilateral salpingo-oophorectomy December 2014.  SOCIAL HISTORY:  Delonna works at [employer] for [employer]. She mostly deals with government documents. She is single, lives by herself, with no pets. She attends [employer] locally.    ADVANCED DIRECTIVES: Not in place. The patient has a documents  and intends to name her sister Shylee Durrett as healthcare power of attorney. Joelene Millin lives in [place] and can be reached at [place]: Social History   Tobacco Use  . Smoking status: Never Smoker  . Smokeless tobacco: Never Used  Substance Use Topics  . Alcohol use: No  . Drug use: No     Colonoscopy:  PAP:  Bone density:  Lipid panel:  Allergies  Allergen Reactions  . Allegra [Fexofenadine] Hives    Abdomen only  . Cat Hair Extract Other (See Comments)  . Shellfish Allergy Hives    Abdomen only  . Glucosamine Rash    Current Outpatient Medications  Medication Sig Dispense Refill  . anastrozole (ARIMIDEX) 1 MG tablet  Take 1 tablet (1 mg total) by mouth daily. 90 tablet 4  . Cholecalciferol (VITAMIN D3) 2000 units TABS Take by mouth daily.    Marland Kitchen dexamethasone (DECADRON) 0.5 MG/5ML solution Swish 72m in mouth for 2 min and spit out. Use 4 times daily for 8 weeks at start of Afinitor. 500 mL 3  . everolimus (AFINITOR) 10 MG tablet Take 1 tablet (10 mg total) by mouth daily. (Patient not taking: Reported on 12/25/2016) 30 tablet 6  . gabapentin (NEURONTIN) 100 MG capsule Take 1 capsule (100 mg total) by mouth at bedtime. Take 100-3067mat night for neuropathy 90 capsule 0  . Multiple Vitamin (MULTIVITAMIN) tablet Take 1 tablet by mouth daily.    . ondansetron (ZOFRAN) 8 MG tablet Take 1 tablet (8 mg total) by mouth every 8 (eight) hours as needed for nausea or vomiting. 30 tablet 1  . polyethylene glycol (MIRALAX / GLYCOLAX) packet Take 17 g by mouth daily. 14 each 0  . prochlorperazine (COMPAZINE) 10 MG tablet Take 1 tablet (10 mg total) by mouth every 6 (six) hours as needed (Nausea or vomiting). 30 tablet 1  . traMADol (ULTRAM) 50 MG tablet Take 1 tablet (50 mg total) by mouth every 6 (six) hours as needed. 60 tablet 3   No current facility-administered medications for this visit.     OBJECTIVE:  Vitals:   12/25/16 0810  BP: (!) 141/67  Pulse: (!) 118  Resp: 18  Temp: 97.8 F (36.6 C)  SpO2: 96%     Body mass index is 17.53 kg/m.    ECOG FS:1 - Symptomatic but completely ambulatory  GENERAL: Patient is a well appearing female in no acute distress HEENT:  Sclerae anicteric.  Oropharynx clear and moist. No ulcerations or evidence of oropharyngeal candidiasis. Neck is supple.  NODES:  No cervical, supraclavicular, or axillary lymphadenopathy palpated.  BREAST EXAM:  Deferred. LUNGS:  Clear to auscultation bilaterally.  No wheezes or rhonchi. HEART:  Regular rate and rhythm. No murmur appreciated. ABDOMEN:  Soft, nontender.  Positive, normoactive bowel sounds. No organomegaly palpated. MSK:  No focal  spinal tenderness to palpation. Full range of motion bilaterally in the upper extremities. EXTREMITIES:  No peripheral edema.   SKIN:  Clear with no obvious rashes or skin changes. No nail dyscrasia. NEURO:  Nonfocal. Well oriented.  Appropriate affect.      LAB RESULTS:  CMP     Component Value Date/Time   NA 142 12/25/2016 0753   K 4.2 12/25/2016 0753   CL 105 09/13/2016 1043   CL 102 01/16/2012 0804   CO2 23 12/25/2016 0753   GLUCOSE 125 12/25/2016 0753   GLUCOSE 92 01/16/2012 0804   BUN 17.8 12/25/2016 0753   CREATININE 1.4 (H) 12/25/2016  0753   CALCIUM 9.5 12/25/2016 0753   PROT 8.1 12/25/2016 0753   ALBUMIN 3.5 12/25/2016 0753   AST 32 12/25/2016 0753   ALT 23 12/25/2016 0753   ALKPHOS 152 (H) 12/25/2016 0753   BILITOT 0.49 12/25/2016 0753   GFRNONAA 33 (L) 09/13/2016 1043   GFRAA 38 (L) 09/13/2016 1043    I No results found for: SPEP  Lab Results  Component Value Date   WBC 4.8 12/25/2016   NEUTROABS 3.2 12/25/2016   HGB 6.7 (LL) 12/25/2016   HCT 21.3 (L) 12/25/2016   MCV 86.2 12/25/2016   PLT 36 (L) 12/25/2016      Chemistry      Component Value Date/Time   NA 142 12/25/2016 0753   K 4.2 12/25/2016 0753   CL 105 09/13/2016 1043   CL 102 01/16/2012 0804   CO2 23 12/25/2016 0753   BUN 17.8 12/25/2016 0753   CREATININE 1.4 (H) 12/25/2016 0753      Component Value Date/Time   CALCIUM 9.5 12/25/2016 0753   ALKPHOS 152 (H) 12/25/2016 0753   AST 32 12/25/2016 0753   ALT 23 12/25/2016 0753   BILITOT 0.49 12/25/2016 0753       Lab Results  Component Value Date   LABCA2 24 05/31/2011    No components found for: [id number]  No results for input(s): INR in the last 168 hours.  Urinalysis    Component Value Date/Time   COLORURINE STRAW (A) 09/05/2016 2000    STUDIES: Mr Liver W Wo Contrast  Result Date: 12/04/2016 CLINICAL DATA:  Metastatic breast cancer with small liver lesions for further characterization EXAM: MRI ABDOMEN WITHOUT AND  WITH CONTRAST TECHNIQUE: Multiplanar multisequence MR imaging of the abdomen was performed both before and after the administration of intravenous contrast. CONTRAST:  5m MULTIHANCE GADOBENATE DIMEGLUMINE 529 MG/ML IV SOLN COMPARISON:  Multiple exams, including ultrasound of 10/12/2016 and CT chest from 04/06/2016. FINDINGS: Lower chest: New small right pleural effusion. Essentially stable 7 mm right lower lobe pulmonary nodule adjacent to the diaphragm on image 28/902. Hepatobiliary: In addition to some scattered small cysts in the liver which are chronic, there are numerous scattered enhancing lesions in the liver, many of which demonstrate either rim enhancement or complete enhancement in the early arterial phase and generally with delayed enhancement observed. Dominant lesion 1.5 by 1.3 cm on image 20/900. These lesions have high precontrast T2 signal characteristics and are sharply defined. Pancreas:  Unremarkable Spleen:  Unremarkable Adrenals/Urinary Tract:  Unremarkable Stomach/Bowel: Unremarkable Vascular/Lymphatic:  Unremarkable Other:  Right mastectomy. Musculoskeletal: Heterogeneous marrow suspicious for widespread osseous metastatic disease. Lower paraspinal muscular edema, without a focal mass in the paraspinal musculature. Left lower anterior rib fracture or rib metastatic lesion. IMPRESSION: 1. Widespread osseous metastatic disease in the visualized skeleton. Stable 7 mm right lower lobe pulmonary nodule. 2. 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 Given the large number of lesions, metastatic disease is more likely although hemangiomatosis could produce a similar appearance. Nuclear medicine PET-CT could be used to differentiate if this affects therapy. These lesions are much less conspicuous on CT although the dominant lesion in the dome of the right hepatic lobe was  noted on the CT from 04/06/2016. 3. New small right pleural effusion. 4. Left lower anterior rib fracture, possibly pathologic. 5. Lower paraspinal muscular edema especially in the vicinity of suspected prior radiation therapy in the lower lumbar spine, may be  a result of prior radiation therapy. Electronically Signed   By: Van Clines M.D.   On: 12/04/2016 10:10   Nm Pet Image Restag (ps) Skull Base To Thigh  Result Date: 12/08/2016 CLINICAL DATA:  Subsequent treatment strategy for metastatic breast cancer. EXAM: NUCLEAR MEDICINE PET SKULL BASE TO THIGH TECHNIQUE: 5.88 mCi F-18 FDG was injected intravenously. Full-ring PET imaging was performed from the skull base to thigh after the radiotracer. CT data was obtained and used for attenuation correction and anatomic localization. FASTING BLOOD GLUCOSE:  Value: 93 mg/dl COMPARISON:  PET-CT 06/08/2011, chest CT 04/06/2016, bone scan 04/06/2016 and abdominal MRI 12/04/2016 FINDINGS: NECK No hypermetabolic cervical lymph nodes are identified.There are no lesions of the pharyngeal mucosal space. CHEST There are no hypermetabolic mediastinal, hilar, axillary or internal mammary lymph nodes. Surgical clips are present in the right axilla. There is no suspicious pulmonary activity. There are new small bilateral pleural effusions. Asymmetric scarring at the right lung apex appears stable. No new or enlarging pulmonary nodules are seen. ABDOMEN/PELVIS There is a small hypermetabolic lesion at the dome of the right hepatic lobe, corresponding with the lesion on recent MRI. This is not well seen on the CT images, although measures approximately 14 mm on CT image 96. This has an SUV max of 3.9. The hepatic activity is otherwise normal. Scattered hepatic cysts are again noted. There is no abnormal metabolic activity within the spleen, pancreas or adrenal glands. There is no hypermetabolic nodal activity. There is trace pelvic ascites. SKELETON Patient has widespread  mixed lytic and blastic metastatic disease status post lower thoracic fusion. There is heterogeneous mildly increased metabolic activity throughout the axial and proximal appendicular skeleton. For example, in the left sacrum, there is hypermetabolic activity with an SUV max of 5.0. SUV max at T11 is 4.3 and at L3 is 5.1. IMPRESSION: 1. There is low-level hypermetabolic activity within the enhancing lesion in the dome of the right hepatic lobe, consistent with metastasis. 2. There is also heterogeneous hypermetabolic activity throughout the axial and proximal appendicular skeleton, corresponding with chronic osseous metastatic disease. 3. No other evidence of metabolically active metastatic disease. Electronically Signed   By: Richardean Sale M.D.   On: 12/08/2016 11:43    ASSESSMENT: 46 y.o. BRCA negative  woman status post right breast upper outer quadrant and right axillary lymph node biopsy 05/18/2011, both positive for an invasive ductal carcinoma, high-grade, clinicallyT2 N1-2 or stage IIB/IIIA,  estrogen receptor 100% positive, progesterone receptor 87% positive, with an MIB-1 of 14% and no HER-2 amplification ([id number]).  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   (a) VUS were also found in [place] and BARD1  (2) additional right breast biopsy upper inner quadrant 06/08/2011 showed only a fibroadenoma, and central left breast biopsy for another suspicious lesion showed only fibrocystic changes ([id number] and [id number]).   (3)Treated neoadjuvantly with cyclophosphamide and docetaxel x4 completed 08/29/2011.  (4) status post right modified radical mastectomy 10/03/2011 showing a residual pT1c pN1a (3/18 lymph nodes positive) invasive ductal carcinoma, grade 1,estrogen receptor 100% positive, progesterone receptor negative, with no HER-2 amplification ([id number])  (5) adjuvant radiation to the right chest wall, right  supraclavicular fossa and right scar completed 12/26/2011  (6) tamoxifen started January 2014-- discontinued April 2016 with evidence of metastatic disease  (7) status post total hysterectomy with bilateral salpingo-oophorectomy 12/23/2012 with benign pathology ([id number])  METASTATIC DISEASE 04/13/2014 (8) presented with T8 cord compression  and underwent laminectomy 04/13/2014 with T8 decompression of spinal cord, posterior fixation from T6-T10 with pedicle screws and rods, posterior arthrodesis with allograft. 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 ExacTrac Snap verification was performed for each couch angle.   (b) radiation to the T-spine completed 05/08/2014.  (c) [facility]  (d) SRS to 0.4 cm left parietal lesion 12/23/2015  (11) started fulvestrant 05/18/2014 and Palbociclib 06/01/2014  (a) Palbociclib dose decreased to 100 mg daily, 21/7 as of 08/05/2014  (b) palbociclib dose decreased to 75 mg daily, 21/7, starting May 2017  (c) palbociclib dose decreased to 75 mg every other day beginning 07/26/2015    (d) palbociclib held for month of October because of persistent low counts, resumed November  (e) fulvestrant discontinued after 11/02/2016 dose: Anastrozole started 08/01/2016    (12) started zolendronate 05/18/2014, repeated every 12 weeks  (a) July zolendronate dose held because of dental issues, resumed 10/18/2015  (b) switched to denosumab as of  01/20/2016 with concern of bony progression on zolendronate  (c) switching back to zolendronate as of April 2018 at patient's request  (d) switched to denosumab/Xgeva because of hypercalcemia, first dose 09/13/2016  (13) most recent staging studies:  (a) brain MRI 07/13/2016 shows stable to smaller treated lesions.  (b) bone scan 04/06/2016 shows no new lesions  (c) chest CT scan 04/06/2016 shows small bilateral pulmonary nodules with no evidence of disease progression  (d) MRI of the thoracolumbar spine shows diffuse bony metastases particularly involving T8, T11 and T12, but without epidural disease. There were subcentimeter pulmonary nodules and indeterminate liver lesions. CT scan of the left hip 09/06/2016 showed multiple bony metastases but no fracture  (14) anastrozole resumed 10/20/2016, discontinued 12/13/2016  (a) everolimus started 09/25/2016, discontinued 12/06/2016  (15) palliative radiation to the right hip completed 09/26/2016  (16) to start Doxil chemotherapy 12/25/2016 (plan would be for her to receive 3-4 cycles of Doxil and then restage with an MRI of the liver and likely repeat PET scan, MRI brain repeat due mid January 2019)  (a) baseline echocardiogram on 12/22/16 shows LVEF of 55-60%    PLAN: WeOrras doing well [name].  Her hemoglobin is 6.7 and she has minimal symptoms.  She will proceed with chemotherapy with Doxil, however we will transfuse her with one unit of blood as well [name].  She has anti emetics at home to take if needed.  We reviewed her echocardiogram results as well.    She will proceed with treatment [name], and she will return on Friday for labs only and on Monday for f/u with me to see how well she tolerated treatment.   WeDannianows to call for any questions or concerns prior to her next appointment with [place]   A total of (30) minutes of face-to-face time was spent with this patient with greater than 50% of that time in counseling and  care-coordination.   LiWilber BihariNP  12/25/16 9:25 AM Medical Oncology and Hematology [facility]vNay[place] [id number]. [phone]  Fax. [phone]

## 2016-12-25 NOTE — Telephone Encounter (Signed)
Per Dr [name], pt will get chemo today with HGB of 6.7 and PLT of 36.  Pt to also get 1 unit of blood today and schedule for platelet transfusion later in the week.

## 2016-12-26 ENCOUNTER — Telehealth: Payer: Self-pay

## 2016-12-26 LAB — TYPE AND SCREEN
ABO/RH(D): B POS
ANTIBODY SCREEN: NEGATIVE
Unit division: 0

## 2016-12-26 LAB — BPAM RBC
BLOOD PRODUCT EXPIRATION DATE: 201901122359
ISSUE DATE / TIME: 201812171236
UNIT TYPE AND RH: 7300

## 2016-12-26 NOTE — Telephone Encounter (Signed)
Follow up call after first infusion yesterday. Pt is doing well. No questions or concerns at this time.  Cyndia Bent RN

## 2016-12-27 ENCOUNTER — Other Ambulatory Visit: Payer: BC Managed Care – PPO

## 2016-12-27 MED FILL — PROCHLORPERAZINE 10 MG TAB: 10 | 7 days supply | Qty: 30 | Fill #0

## 2016-12-29 ENCOUNTER — Other Ambulatory Visit (HOSPITAL_BASED_OUTPATIENT_CLINIC_OR_DEPARTMENT_OTHER): Payer: BC Managed Care – PPO

## 2016-12-29 DIAGNOSIS — C50919 Malignant neoplasm of unspecified site of unspecified female breast: Secondary | ICD-10-CM

## 2016-12-29 DIAGNOSIS — C7931 Secondary malignant neoplasm of brain: Principal | ICD-10-CM

## 2016-12-29 DIAGNOSIS — C50411 Malignant neoplasm of upper-outer quadrant of right female breast: Secondary | ICD-10-CM

## 2016-12-29 LAB — COMPREHENSIVE METABOLIC PANEL
ALT: 21 U/L (ref 0–55)
ANION GAP: 10 meq/L (ref 3–11)
AST: 37 U/L — ABNORMAL HIGH (ref 5–34)
Albumin: 3.5 g/dL (ref 3.5–5.0)
Alkaline Phosphatase: 165 U/L — ABNORMAL HIGH (ref 40–150)
BUN: 19.5 mg/dL (ref 7.0–26.0)
CHLORIDE: 107 meq/L (ref 98–109)
CO2: 24 meq/L (ref 22–29)
Calcium: 8.8 mg/dL (ref 8.4–10.4)
Creatinine: 1.1 mg/dL (ref 0.6–1.1)
Glucose: 103 mg/dl (ref 70–140)
Potassium: 4.3 mEq/L (ref 3.5–5.1)
Sodium: 140 mEq/L (ref 136–145)
Total Bilirubin: 0.48 mg/dL (ref 0.20–1.20)
Total Protein: 8.2 g/dL (ref 6.4–8.3)

## 2016-12-29 LAB — CBC WITH DIFFERENTIAL/PLATELET
BASO%: 0.6 % (ref 0.0–2.0)
BASOS ABS: 0 10*3/uL (ref 0.0–0.1)
EOS ABS: 0 10*3/uL (ref 0.0–0.5)
EOS%: 0.6 % (ref 0.0–7.0)
HCT: 27.3 % — ABNORMAL LOW (ref 34.8–46.6)
HEMOGLOBIN: 8.8 g/dL — AB (ref 11.6–15.9)
LYMPH#: 1 10*3/uL (ref 0.9–3.3)
LYMPH%: 19.9 % (ref 14.0–49.7)
MCH: 27.9 pg (ref 25.1–34.0)
MCHC: 32.2 g/dL (ref 31.5–36.0)
MCV: 86.7 fL (ref 79.5–101.0)
MONO#: 0.4 10*3/uL (ref 0.1–0.9)
MONO%: 8.1 % (ref 0.0–14.0)
NEUT%: 70.8 % (ref 38.4–76.8)
NEUTROS ABS: 3.4 10*3/uL (ref 1.5–6.5)
NRBC: 7 % — AB (ref 0–0)
PLATELETS: 30 10*3/uL — AB (ref 145–400)
RBC: 3.15 10*6/uL — ABNORMAL LOW (ref 3.70–5.45)
RDW: 15.3 % — AB (ref 11.2–14.5)
WBC: 4.8 10*3/uL (ref 3.9–10.3)

## 2016-12-29 LAB — TECHNOLOGIST REVIEW

## 2017-01-03 ENCOUNTER — Ambulatory Visit (HOSPITAL_BASED_OUTPATIENT_CLINIC_OR_DEPARTMENT_OTHER): Payer: BC Managed Care – PPO | Admitting: Adult Health

## 2017-01-03 ENCOUNTER — Encounter: Payer: Self-pay | Admitting: Adult Health

## 2017-01-03 ENCOUNTER — Ambulatory Visit (HOSPITAL_BASED_OUTPATIENT_CLINIC_OR_DEPARTMENT_OTHER): Payer: BC Managed Care – PPO

## 2017-01-03 ENCOUNTER — Other Ambulatory Visit: Payer: Self-pay | Admitting: *Deleted

## 2017-01-03 ENCOUNTER — Other Ambulatory Visit (HOSPITAL_BASED_OUTPATIENT_CLINIC_OR_DEPARTMENT_OTHER): Payer: BC Managed Care – PPO

## 2017-01-03 VITALS — BP 143/72 | HR 106 | Temp 97.9°F | Resp 17 | Ht 68.0 in | Wt 113.2 lb

## 2017-01-03 DIAGNOSIS — Z170 Estrogen receptor positive status [ER+]: Secondary | ICD-10-CM

## 2017-01-03 DIAGNOSIS — C50411 Malignant neoplasm of upper-outer quadrant of right female breast: Secondary | ICD-10-CM

## 2017-01-03 DIAGNOSIS — C773 Secondary and unspecified malignant neoplasm of axilla and upper limb lymph nodes: Secondary | ICD-10-CM | POA: Diagnosis not present

## 2017-01-03 DIAGNOSIS — C7931 Secondary malignant neoplasm of brain: Principal | ICD-10-CM

## 2017-01-03 DIAGNOSIS — C7951 Secondary malignant neoplasm of bone: Secondary | ICD-10-CM

## 2017-01-03 DIAGNOSIS — R634 Abnormal weight loss: Secondary | ICD-10-CM

## 2017-01-03 DIAGNOSIS — D696 Thrombocytopenia, unspecified: Secondary | ICD-10-CM

## 2017-01-03 DIAGNOSIS — C50919 Malignant neoplasm of unspecified site of unspecified female breast: Secondary | ICD-10-CM

## 2017-01-03 LAB — COMPREHENSIVE METABOLIC PANEL
ALBUMIN: 3.4 g/dL — AB (ref 3.5–5.0)
ALK PHOS: 178 U/L — AB (ref 40–150)
ALT: 13 U/L (ref 0–55)
ANION GAP: 11 meq/L (ref 3–11)
AST: 30 U/L (ref 5–34)
BUN: 24.6 mg/dL (ref 7.0–26.0)
CALCIUM: 8.8 mg/dL (ref 8.4–10.4)
CHLORIDE: 107 meq/L (ref 98–109)
CO2: 22 mEq/L (ref 22–29)
Creatinine: 1 mg/dL (ref 0.6–1.1)
Glucose: 126 mg/dl (ref 70–140)
POTASSIUM: 4.4 meq/L (ref 3.5–5.1)
Sodium: 141 mEq/L (ref 136–145)
Total Bilirubin: 0.38 mg/dL (ref 0.20–1.20)
Total Protein: 7.7 g/dL (ref 6.4–8.3)

## 2017-01-03 LAB — CBC WITH DIFFERENTIAL/PLATELET
BASO%: 0.6 % (ref 0.0–2.0)
Basophils Absolute: 0 10*3/uL (ref 0.0–0.1)
EOS ABS: 0 10*3/uL (ref 0.0–0.5)
EOS%: 0.9 % (ref 0.0–7.0)
HCT: 23.9 % — ABNORMAL LOW (ref 34.8–46.6)
HGB: 7.9 g/dL — ABNORMAL LOW (ref 11.6–15.9)
LYMPH%: 23.9 % (ref 14.0–49.7)
MCH: 28 pg (ref 25.1–34.0)
MCHC: 33.1 g/dL (ref 31.5–36.0)
MCV: 84.8 fL (ref 79.5–101.0)
MONO#: 0.1 10*3/uL (ref 0.1–0.9)
MONO%: 1.7 % (ref 0.0–14.0)
NEUT%: 72.9 % (ref 38.4–76.8)
NEUTROS ABS: 2.5 10*3/uL (ref 1.5–6.5)
NRBC: 2 % — AB (ref 0–0)
PLATELETS: 17 10*3/uL — AB (ref 145–400)
RBC: 2.82 10*6/uL — ABNORMAL LOW (ref 3.70–5.45)
RDW: 14.9 % — AB (ref 11.2–14.5)
WBC: 3.4 10*3/uL — AB (ref 3.9–10.3)
lymph#: 0.8 10*3/uL — ABNORMAL LOW (ref 0.9–3.3)

## 2017-01-03 LAB — TECHNOLOGIST REVIEW: Technologist Review: 1

## 2017-01-03 MED ORDER — DENOSUMAB 120 MG/1.7ML ~~LOC~~ SOLN
SUBCUTANEOUS | Status: AC
  Filled 2017-01-03: qty 1.7

## 2017-01-03 MED ORDER — DENOSUMAB 120 MG/1.7ML ~~LOC~~ SOLN
120.0000 mg | Freq: Once | SUBCUTANEOUS | Status: AC
  Administered 2017-01-03: 120 mg via SUBCUTANEOUS

## 2017-01-03 NOTE — Progress Notes (Signed)
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  She received Doxil last week and she tolerated it well.  She did have some anemia and was transfused with one unit of blood.    REVIEW OF SYSTEMS: Carolyn [name] is feeling well.  She continues to lose weight and is down another 2 pounds.  She does not want to eat or drink certain things due to chemicals that she has heard that are in this products (Ensure) and how those may affect her body.  She is fatigued, has some mild shortness of breath, but denies easy bruising or bleeding, or any other concerns today.  A detailed ROS is otherwise non contributory.     BREAST CANCER HISTORY: From doctor Carolyn [name]'s of original intake node 05/31/2011:  "Carolyn [name] is a 46 y.o. female. Without significant past medical history. She underwent a mammogram that showed calcifications measuring 5 cm on the right breast. She then went on To have an ultrasound which showed the area to measure 2.9 cm. She was also found to have a right axillary lymph node that was suspicious for a malignancy. She had a biopsy of the calcifications that showed high-grade ductal carcinoma in situ. Biopsy of the right axillary lymph node showed a high-grade invasive  ductal carcinoma. In the lymph node biopsy there was no lymphatic tissue seen and was felt that the node was replaced by tumor. Patient went on to have an MRI of the bilateral breasts performed on evening of 05/30/2011. The MRI showed in the right breast 5 x 3 x 4.5 cm mass abutting the chest wall. About 7 cm away from this there was another mass in the upper inner quadrant that measured 1.5 x 1.7 x 1.5 cm. On the contralateral breast, that is the left breast a 2 cm area suspicious enhancement was noted also this up to date has not been biopsied and arrangements are being made for the biopsy to be performed. In this side there were no suspicious lymph nodes. The prognostic panel is pending. Patient is otherwise without any complaints. "  METASTATIC DISEASE: From the earlier summary note:  Carolyn [name] noted some strange feelings around her umbilicus 50/53/9767. This felt like an area of numbness. Over the next 2 days she noted some leg weakness and difficulty walking, so she presented to the ED 04/12/2014. MRI of the thoraco-lumbar spine was obtained 04/12/2014 showing multiple bone lesions and compression fracture at T8 with retropulsion and cord compression. On 04/13/2014 she underwent Laminectomy T8 decompression of spinal cord posterior fixation from T6-T10 with pedicle screws and rods posterior arthrodesis with allograft. The pathology from this procedure ([id number] [id number]) showed metastatic adenocarcinoma which was estrogen receptor 69% positive, with moderate staining intensity, progesterone receptor negative. HER-2 could not be obtained.  The MRi review suggested possible brain involvement and on 04/14/2014 she had a brain MRI  which showed a 0.7 cm lesion in the L centrum semiovale. There were nonspecific L temporal bone changes and also possible involvement of the clivus and calvarium, but no other parenchymal brain lesions. Further staging studies included a bone scan, which failed to show the lytic lesion seen  on other scans, and CTs of the chest, abdomen and pelvis on 06/08/2011, which showed very small right lung and left liver lesions which will require follow-up  Her subsequent history is as detailed below   PAST MEDICAL HISTORY: Past Medical History:  Diagnosis Date  . Breast cancer ([place]) 09/2011   invasive ductal carcinoma metastatic ca in 3/14 lymph nodes  . History of chemotherapy 06/27/11 -09/04/11   neoadjuvant  . Hx of radiation therapy 11/08/11 -12/26/11   right chest wall/supraclav fossa, right scar  . S/P radiation therapy 05/08/14   SRS brain    PAST SURGICAL HISTORY: Past Surgical History:  Procedure Laterality Date  . brain surgery-LITT Left 11/20/14   LITT procedure for Lt brain met.  Marland Kitchen BREAST BIOPSY     Left  . CHOLECYSTECTOMY N/A 03/01/2014   Procedure: LAPAROSCOPIC CHOLECYSTECTOMY;  Surgeon: Leighton Ruff, MD;  Location: [facility];  Service: General;  Laterality: N/A;  . LAMINECTOMY N/A 04/13/2014   Procedure: THORACIC LAMINECTOMY WITH FIXATION THORACIC SIX-THORACIC TEN FUSION;  Surgeon: Kristeen Miss, MD;  Location: [facility];  Service: Neurosurgery;  Laterality: N/A;  . MODIFIED MASTECTOMY  10/03/2011   Procedure: MODIFIED MASTECTOMY;  Surgeon: Haywood Lasso, MD;  Location: [facility];  Service: General;  Laterality: Right;  . PORT-A-CATH REMOVAL  01/30/2012   Procedure: MINOR REMOVAL PORT-A-CATH;  Surgeon: Haywood Lasso, MD;  Location: [facility];  Service: General;  Laterality: Left;  . PORTACATH PLACEMENT  06/20/2011   Procedure: INSERTION PORT-A-CATH;  Surgeon: Haywood Lasso, MD;  Location: [facility];  Service: General;  Laterality: N/A;  . ROBOTIC ASSISTED TOTAL HYSTERECTOMY WITH BILATERAL SALPINGO OOPHERECTOMY Bilateral 12/23/2012   Procedure: ROBOTIC ASSISTED TOTAL HYSTERECTOMY WITH BILATERAL SALPINGO OOPHORECTOMY;  Surgeon: Marvene Staff, MD;  Location: [facility];  Service: Gynecology;  Laterality: Bilateral;  . WISDOM  TOOTH EXTRACTION      FAMILY HISTORY Family History  Problem Relation Age of Onset  . Breast cancer Maternal Aunt 58  . Pancreatic cancer Maternal Grandfather   . Pancreatic cancer Maternal Aunt        died in her 79s  . Leukemia Maternal Aunt        died in her 45s  . Ovarian cancer Cousin 19       maternal cousin   the patient's father is alive, currently 54 years old. The patient's mother died from complications of diabetes at the age of 51. The patient had no brothers, 4 sisters. One sister has died from congestive heart failure. The patient's mother is one of 5 sisters. One of them was diagnosed with breast cancer the age of 60. Also a cousin on the maternal side it was diagnosed with ovarian cancer. This was approximately age 51. There is also colon cancer in the family. The patient has had extensive genetic testing summarized below. She is however BRCA negative  GYNECOLOGIC HISTORY:  Patient's last menstrual period was 07/05/2011 (exact date). Menarche age 68, she is GX P0. She underwent total hysterectomy with bilateral salpingo-oophorectomy December 2014.  SOCIAL HISTORY:  Isola works at [employer] for [employer]. She mostly deals with government documents. She is single, lives by herself, with no  pets. She attends [employer] locally.    ADVANCED DIRECTIVES: Not in place. The patient has a documents and intends to name her sister Kenadee Gates as healthcare power of attorney. Joelene Millin lives in [place] and can be reached at [place]: Social History   Tobacco Use  . Smoking status: Never Smoker  . Smokeless tobacco: Never Used  Substance Use Topics  . Alcohol use: No  . Drug use: No     Colonoscopy:  PAP:  Bone density:  Lipid panel:  Allergies  Allergen Reactions  . Allegra [Fexofenadine] Hives    Abdomen only  . Cat Hair Extract Other (See Comments)  . Shellfish Allergy Hives    Abdomen only  . Glucosamine Rash    Current Outpatient  Medications  Medication Sig Dispense Refill  . Cholecalciferol (VITAMIN D3) 2000 units TABS Take by mouth daily.    Marland Kitchen gabapentin (NEURONTIN) 100 MG capsule Take 1 capsule (100 mg total) by mouth at bedtime. Take 100-342m at night for neuropathy 90 capsule 0  . Multiple Vitamin (MULTIVITAMIN) tablet Take 1 tablet by mouth daily.    . ondansetron (ZOFRAN) 8 MG tablet Take 1 tablet (8 mg total) by mouth every 8 (eight) hours as needed for nausea or vomiting. 30 tablet 1  . polyethylene glycol (MIRALAX / GLYCOLAX) packet Take 17 g by mouth daily. 14 each 0  . prochlorperazine (COMPAZINE) 10 MG tablet Take 1 tablet (10 mg total) by mouth every 6 (six) hours as needed (Nausea or vomiting). 30 tablet 1  . traMADol (ULTRAM) 50 MG tablet Take 1 tablet (50 mg total) by mouth every 6 (six) hours as needed. 60 tablet 3   No current facility-administered medications for this visit.     OBJECTIVE:  Vitals:   01/03/17 0910  BP: (!) 143/72  Pulse: (!) 106  Resp: 17  Temp: 97.9 F (36.6 C)  SpO2: 100%     Body mass index is 17.21 kg/m.    ECOG FS:1 - Symptomatic but completely ambulatory  GENERAL: Patient is a well appearing female in no acute distress HEENT:  Sclerae anicteric.  Oropharynx clear and moist. No ulcerations or evidence of oropharyngeal candidiasis. Neck is supple.  NODES:  No cervical, supraclavicular, or axillary lymphadenopathy palpated.  BREAST EXAM:  Deferred. LUNGS:  Clear to auscultation bilaterally.  No wheezes or rhonchi. HEART:  Regular rate and rhythm. No murmur appreciated. ABDOMEN:  Soft, nontender.  Positive, normoactive bowel sounds. No organomegaly palpated. MSK:  No focal spinal tenderness to palpation. Full range of motion bilaterally in the upper extremities. EXTREMITIES:  No peripheral edema.   SKIN:  Clear with no obvious rashes or skin changes. No nail dyscrasia. NEURO:  Nonfocal. Well oriented.  Appropriate affect.      LAB RESULTS:  CMP     Component  Value Date/Time   NA 141 01/03/2017 0819   K 4.4 01/03/2017 0819   CL 105 09/13/2016 1043   CL 102 01/16/2012 0804   CO2 22 01/03/2017 0819   GLUCOSE 126 01/03/2017 0819   GLUCOSE 92 01/16/2012 0804   BUN 24.6 01/03/2017 0819   CREATININE 1.0 01/03/2017 0819   CALCIUM 8.8 01/03/2017 0819   PROT 7.7 01/03/2017 0819   ALBUMIN 3.4 (L) 01/03/2017 0819   AST 30 01/03/2017 0819   ALT 13 01/03/2017 0819   ALKPHOS 178 (H) 01/03/2017 0819   BILITOT 0.38 01/03/2017 0819   GFRNONAA 33 (L) 09/13/2016 1043   GFRAA 38 (  L) 09/13/2016 1043    I No results found for: SPEP  Lab Results  Component Value Date   WBC 4.8 12/29/2016   NEUTROABS 3.4 12/29/2016   HGB 8.8 (L) 12/29/2016   HCT 27.3 (L) 12/29/2016   MCV 86.7 12/29/2016   PLT 30 (L) 12/29/2016      Chemistry      Component Value Date/Time   NA 141 01/03/2017 0819   K 4.4 01/03/2017 0819   CL 105 09/13/2016 1043   CL 102 01/16/2012 0804   CO2 22 01/03/2017 0819   BUN 24.6 01/03/2017 0819   CREATININE 1.0 01/03/2017 0819      Component Value Date/Time   CALCIUM 8.8 01/03/2017 0819   ALKPHOS 178 (H) 01/03/2017 0819   AST 30 01/03/2017 0819   ALT 13 01/03/2017 0819   BILITOT 0.38 01/03/2017 0819       Lab Results  Component Value Date   LABCA2 24 05/31/2011    No components found for: [id number]  No results for input(s): INR in the last 168 hours.  Urinalysis    Component Value Date/Time   COLORURINE STRAW (A) 09/05/2016 2000    STUDIES: Mr Liver W Wo Contrast  Result Date: 12/04/2016 CLINICAL DATA:  Metastatic breast cancer with small liver lesions for further characterization EXAM: MRI ABDOMEN WITHOUT AND WITH CONTRAST TECHNIQUE: Multiplanar multisequence MR imaging of the abdomen was performed both before and after the administration of intravenous contrast. CONTRAST:  22m MULTIHANCE GADOBENATE DIMEGLUMINE 529 MG/ML IV SOLN COMPARISON:  Multiple exams, including ultrasound of 10/12/2016 and CT chest from  04/06/2016. FINDINGS: Lower chest: New small right pleural effusion. Essentially stable 7 mm right lower lobe pulmonary nodule adjacent to the diaphragm on image 28/902. Hepatobiliary: In addition to some scattered small cysts in the liver which are chronic, there are numerous scattered enhancing lesions in the liver, many of which demonstrate either rim enhancement or complete enhancement in the early arterial phase and generally with delayed enhancement observed. Dominant lesion 1.5 by 1.3 cm on image 20/900. These lesions have high precontrast T2 signal characteristics and are sharply defined. Pancreas:  Unremarkable Spleen:  Unremarkable Adrenals/Urinary Tract:  Unremarkable Stomach/Bowel: Unremarkable Vascular/Lymphatic:  Unremarkable Other:  Right mastectomy. Musculoskeletal: Heterogeneous marrow suspicious for widespread osseous metastatic disease. Lower paraspinal muscular edema, without a focal mass in the paraspinal musculature. Left lower anterior rib fracture or rib metastatic lesion. IMPRESSION: 1. Widespread osseous metastatic disease in the visualized skeleton. Stable 7 mm right lower lobe pulmonary nodule. 2. 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 Given the large number of lesions, metastatic disease is more likely although hemangiomatosis could produce a similar appearance. Nuclear medicine PET-CT could be used to differentiate if this affects therapy. These lesions are much less conspicuous on CT although the dominant lesion in the dome of the right hepatic lobe was noted on the CT from 04/06/2016. 3. New small right pleural effusion. 4. Left lower anterior rib fracture, possibly pathologic. 5. Lower paraspinal muscular edema especially in the vicinity of suspected prior radiation therapy in the lower lumbar spine, may be a result of prior radiation therapy.  Electronically Signed   By: WVan ClinesM.D.   On: 12/04/2016 10:10   Nm Pet Image Restag (ps) Skull Base To Thigh  Result Date: 12/08/2016 CLINICAL DATA:  Subsequent treatment strategy for metastatic breast cancer. EXAM: NUCLEAR MEDICINE PET SKULL BASE TO THIGH TECHNIQUE: 5.88 mCi  F-18 FDG was injected intravenously. Full-ring PET imaging was performed from the skull base to thigh after the radiotracer. CT data was obtained and used for attenuation correction and anatomic localization. FASTING BLOOD GLUCOSE:  Value: 93 mg/dl COMPARISON:  PET-CT 06/08/2011, chest CT 04/06/2016, bone scan 04/06/2016 and abdominal MRI 12/04/2016 FINDINGS: NECK No hypermetabolic cervical lymph nodes are identified.There are no lesions of the pharyngeal mucosal space. CHEST There are no hypermetabolic mediastinal, hilar, axillary or internal mammary lymph nodes. Surgical clips are present in the right axilla. There is no suspicious pulmonary activity. There are new small bilateral pleural effusions. Asymmetric scarring at the right lung apex appears stable. No new or enlarging pulmonary nodules are seen. ABDOMEN/PELVIS There is a small hypermetabolic lesion at the dome of the right hepatic lobe, corresponding with the lesion on recent MRI. This is not well seen on the CT images, although measures approximately 14 mm on CT image 96. This has an SUV max of 3.9. The hepatic activity is otherwise normal. Scattered hepatic cysts are again noted. There is no abnormal metabolic activity within the spleen, pancreas or adrenal glands. There is no hypermetabolic nodal activity. There is trace pelvic ascites. SKELETON Patient has widespread mixed lytic and blastic metastatic disease status post lower thoracic fusion. There is heterogeneous mildly increased metabolic activity throughout the axial and proximal appendicular skeleton. For example, in the left sacrum, there is hypermetabolic activity with an SUV max of 5.0. SUV max at T11  is 4.3 and at L3 is 5.1. IMPRESSION: 1. There is low-level hypermetabolic activity within the enhancing lesion in the dome of the right hepatic lobe, consistent with metastasis. 2. There is also heterogeneous hypermetabolic activity throughout the axial and proximal appendicular skeleton, corresponding with chronic osseous metastatic disease. 3. No other evidence of metabolically active metastatic disease. Electronically Signed   By: Richardean Sale M.D.   On: 12/08/2016 11:43    ASSESSMENT: 46 y.o. BRCA negative [place] woman status post right breast upper outer quadrant and right axillary lymph node biopsy 05/18/2011, both positive for an invasive ductal carcinoma, high-grade, clinicallyT2 N1-2 or stage IIB/IIIA,  estrogen receptor 100% positive, progesterone receptor 87% positive, with an MIB-1 of 14% and no HER-2 amplification ([id number]).  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   (a) VUS were also found in [place] and BARD1  (2) additional right breast biopsy upper inner quadrant 06/08/2011 showed only a fibroadenoma, and central left breast biopsy for another suspicious lesion showed only fibrocystic changes ([id number] and [id number]).   (3)Treated neoadjuvantly with cyclophosphamide and docetaxel x4 completed 08/29/2011.  (4) status post right modified radical mastectomy 10/03/2011 showing a residual pT1c pN1a (3/18 lymph nodes positive) invasive ductal carcinoma, grade 1,estrogen receptor 100% positive, progesterone receptor negative, with no HER-2 amplification ([id number])  (5) adjuvant radiation to the right chest wall, right supraclavicular fossa and right scar completed 12/26/2011  (6) tamoxifen started January 2014-- discontinued April 2016 with evidence of metastatic disease  (7) status post total hysterectomy with bilateral salpingo-oophorectomy 12/23/2012 with benign pathology ([id number])  METASTATIC DISEASE  04/13/2014 (8) presented with T8 cord compression and underwent laminectomy 04/13/2014 with T8 decompression of spinal cord, posterior fixation from T6-T10 with pedicle screws and rods, posterior arthrodesis with allograft. 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 ExacTrac Snap verification was performed for each couch angle.   (b) radiation to the T-spine completed 05/08/2014.  (c) [facility]  (d) SRS to 0.4 cm left parietal lesion 12/23/2015  (11) started fulvestrant 05/18/2014 and Palbociclib 06/01/2014  (a) Palbociclib dose decreased to 100 mg daily, 21/7 as of 08/05/2014  (b) palbociclib dose decreased to 75 mg daily, 21/7, starting May 2017  (c) palbociclib dose decreased to 75 mg every other day beginning 07/26/2015    (d) palbociclib held for month of October because of persistent low counts, resumed November  (e) fulvestrant discontinued after 11/02/2016 dose: Anastrozole started 08/01/2016    (12) started zolendronate 05/18/2014, repeated every 12 weeks  (a) July zolendronate dose held because of dental issues, resumed 10/18/2015  (b) switched to denosumab as of 01/20/2016 with concern of bony progression on zolendronate  (c) switching back to zolendronate as of April 2018 at patient's request  (d) switched to denosumab/Xgeva because of hypercalcemia, first dose 09/13/2016  (13) most recent staging studies:  (a) brain MRI 07/13/2016 shows stable to smaller  treated lesions.  (b) bone scan 04/06/2016 shows no new lesions  (c) chest CT scan 04/06/2016 shows small bilateral pulmonary nodules with no evidence of disease progression  (d) MRI of the thoracolumbar spine shows diffuse bony metastases particularly involving T8, T11 and T12, but without epidural disease. There were subcentimeter pulmonary nodules and indeterminate liver lesions. CT scan of the left hip 09/06/2016 showed multiple bony metastases but no fracture  (14) anastrozole resumed 10/20/2016, discontinued 12/13/2016  (a) everolimus started 09/25/2016, discontinued 12/06/2016  (15) palliative radiation to the right hip completed 09/26/2016  (16) to start Doxil chemotherapy 12/25/2016 (plan would be for her to receive 3-4 cycles of Doxil and then restage with an MRI of the liver and likely repeat PET scan, MRI brain repeat due mid January 2019)  (a) baseline echocardiogram on 12/22/16 shows LVEF of 55-60%    PLAN: WeMirriams doing well today.  She tolerated her transfusion well, along with the Doxil that she received. She continues to lose weight.  I gave her some suggestions of how to eat, however, she is concerned about the ingredients in items such as Boost, or Ensure.  I went ahead and sent in a referral to BaDory Peruour nutritionist to meet with her and discuss these concerns, so they can come up with a plan to help prevent continued weight loss.    The other issue today is her blood counts.  Her plts are 17 today.  She denies any easy bruising or bleeding today.  She will not be tranfused, however will return on Friday for labs, and every Monday for lab work until she is due for Doxil again which will be on 01/22/2017.  She and I reviewed reasons to call our office or seek immediate medical care while her blood counts are decreased.    We[place]anows to call for any questions or concerns prior to her next appointment with [place]   A total of (30) minutes of face-to-face time was spent  with this patient with greater than 50% of that time in counseling and care-coordination.   LiWilber BihariNP  01/03/17  9:19 AM Medical Oncology and Hematology [facility] [address] [place], [place] [address] Tel. [phone]    Fax. [phone]

## 2017-01-03 NOTE — Patient Instructions (Signed)

## 2017-01-05 ENCOUNTER — Telehealth: Payer: Self-pay | Admitting: *Deleted

## 2017-01-05 ENCOUNTER — Other Ambulatory Visit (HOSPITAL_BASED_OUTPATIENT_CLINIC_OR_DEPARTMENT_OTHER): Payer: BC Managed Care – PPO

## 2017-01-05 ENCOUNTER — Other Ambulatory Visit: Payer: Self-pay | Admitting: *Deleted

## 2017-01-05 ENCOUNTER — Other Ambulatory Visit: Payer: Self-pay | Admitting: Adult Health

## 2017-01-05 DIAGNOSIS — C50919 Malignant neoplasm of unspecified site of unspecified female breast: Secondary | ICD-10-CM

## 2017-01-05 DIAGNOSIS — C7931 Secondary malignant neoplasm of brain: Principal | ICD-10-CM

## 2017-01-05 DIAGNOSIS — C50411 Malignant neoplasm of upper-outer quadrant of right female breast: Secondary | ICD-10-CM | POA: Diagnosis not present

## 2017-01-05 DIAGNOSIS — D649 Anemia, unspecified: Secondary | ICD-10-CM | POA: Diagnosis not present

## 2017-01-05 DIAGNOSIS — D61818 Other pancytopenia: Secondary | ICD-10-CM

## 2017-01-05 LAB — CBC WITH DIFFERENTIAL/PLATELET
BASO%: 0.7 % (ref 0.0–2.0)
BASOS ABS: 0 10*3/uL (ref 0.0–0.1)
EOS ABS: 0 10*3/uL (ref 0.0–0.5)
EOS%: 0.7 % (ref 0.0–7.0)
HEMATOCRIT: 23.6 % — AB (ref 34.8–46.6)
HGB: 7.7 g/dL — ABNORMAL LOW (ref 11.6–15.9)
LYMPH%: 29.3 % (ref 14.0–49.7)
MCH: 27.9 pg (ref 25.1–34.0)
MCHC: 32.6 g/dL (ref 31.5–36.0)
MCV: 85.5 fL (ref 79.5–101.0)
MONO#: 0.1 10*3/uL (ref 0.1–0.9)
MONO%: 2.6 % (ref 0.0–14.0)
NEUT#: 2 10*3/uL (ref 1.5–6.5)
NEUT%: 66.7 % (ref 38.4–76.8)
PLATELETS: 15 10*3/uL — AB (ref 145–400)
RBC: 2.76 10*6/uL — ABNORMAL LOW (ref 3.70–5.45)
RDW: 14.9 % — ABNORMAL HIGH (ref 11.2–14.5)
WBC: 3 10*3/uL — ABNORMAL LOW (ref 3.9–10.3)
lymph#: 0.9 10*3/uL (ref 0.9–3.3)
nRBC: 3 % — ABNORMAL HIGH (ref 0–0)

## 2017-01-05 LAB — COMPREHENSIVE METABOLIC PANEL
ALBUMIN: 3.5 g/dL (ref 3.5–5.0)
ALT: 18 U/L (ref 0–55)
AST: 37 U/L — AB (ref 5–34)
Alkaline Phosphatase: 171 U/L — ABNORMAL HIGH (ref 40–150)
Anion Gap: 9 mEq/L (ref 3–11)
BUN: 29.4 mg/dL — AB (ref 7.0–26.0)
CALCIUM: 9.9 mg/dL (ref 8.4–10.4)
CHLORIDE: 107 meq/L (ref 98–109)
CO2: 25 mEq/L (ref 22–29)
CREATININE: 1.2 mg/dL — AB (ref 0.6–1.1)
EGFR: >60 mL/min/{1.73_m2} (ref >60)
GLUCOSE: 108 mg/dL (ref 70–140)
POTASSIUM: 4.9 meq/L (ref 3.5–5.1)
SODIUM: 141 meq/L (ref 136–145)
Total Bilirubin: 0.37 mg/dL (ref 0.20–1.20)
Total Protein: 7.9 g/dL (ref 6.4–8.3)

## 2017-01-05 LAB — TECHNOLOGIST REVIEW

## 2017-01-05 MED FILL — GABAPENTIN 100 MG CAPS: 100 | 30 days supply | Qty: 90 | Fill #0

## 2017-01-05 NOTE — Telephone Encounter (Signed)
This RN spoke with pt and provider per review of lab today with heme of 7.7 and plt of 15,000.  Carolyn [name] states increased fatigue - denies any bleeding.  Per review and discussion pt set up for transfusion of 1 unit of blood and 1 unit of plts for to be transfused 01/06/2017.  Pt verbalized understanding.  Verbal order entered per above and forwarded to MD for signiture.

## 2017-01-06 ENCOUNTER — Other Ambulatory Visit: Payer: Self-pay

## 2017-01-06 ENCOUNTER — Ambulatory Visit (HOSPITAL_BASED_OUTPATIENT_CLINIC_OR_DEPARTMENT_OTHER): Payer: BC Managed Care – PPO

## 2017-01-06 DIAGNOSIS — G959 Disease of spinal cord, unspecified: Secondary | ICD-10-CM

## 2017-01-06 DIAGNOSIS — D649 Anemia, unspecified: Secondary | ICD-10-CM | POA: Diagnosis not present

## 2017-01-06 DIAGNOSIS — D61818 Other pancytopenia: Secondary | ICD-10-CM | POA: Diagnosis not present

## 2017-01-06 LAB — PREPARE RBC (CROSSMATCH)

## 2017-01-06 MED ORDER — SODIUM CHLORIDE 0.9 % IV SOLN
250.0000 mL | Freq: Once | INTRAVENOUS | Status: AC
  Administered 2017-01-06: 250 mL via INTRAVENOUS

## 2017-01-06 MED ORDER — DIPHENHYDRAMINE HCL 25 MG PO CAPS
25.0000 mg | ORAL_CAPSULE | Freq: Once | ORAL | Status: AC
  Administered 2017-01-06: 25 mg via ORAL

## 2017-01-06 MED ORDER — ACETAMINOPHEN 325 MG PO TABS
650.0000 mg | ORAL_TABLET | Freq: Once | ORAL | Status: AC
  Administered 2017-01-06: 650 mg via ORAL

## 2017-01-06 MED ORDER — SODIUM CHLORIDE 0.9 % IV SOLN: 250.0000 mL | Freq: Once | INTRAVENOUS | Status: DC

## 2017-01-06 MED ORDER — ACETAMINOPHEN 325 MG PO TABS
ORAL_TABLET | ORAL | Status: AC
  Filled 2017-01-06: qty 2

## 2017-01-06 MED ORDER — DIPHENHYDRAMINE HCL 25 MG PO CAPS
ORAL_CAPSULE | ORAL | Status: AC
  Filled 2017-01-06: qty 1

## 2017-01-06 NOTE — Patient Instructions (Signed)

## 2017-01-08 ENCOUNTER — Ambulatory Visit: Payer: BC Managed Care – PPO | Admitting: Nutrition

## 2017-01-08 ENCOUNTER — Other Ambulatory Visit (HOSPITAL_BASED_OUTPATIENT_CLINIC_OR_DEPARTMENT_OTHER): Payer: BC Managed Care – PPO

## 2017-01-08 DIAGNOSIS — C50919 Malignant neoplasm of unspecified site of unspecified female breast: Secondary | ICD-10-CM

## 2017-01-08 DIAGNOSIS — C50411 Malignant neoplasm of upper-outer quadrant of right female breast: Secondary | ICD-10-CM

## 2017-01-08 DIAGNOSIS — C7931 Secondary malignant neoplasm of brain: Principal | ICD-10-CM

## 2017-01-08 LAB — PREPARE PLATELET PHERESIS: Unit division: 0

## 2017-01-08 LAB — CBC WITH DIFFERENTIAL/PLATELET
BASO%: 0.5 % (ref 0.0–2.0)
BASOS ABS: 0 10*3/uL (ref 0.0–0.1)
EOS ABS: 0 10*3/uL (ref 0.0–0.5)
EOS%: 1.4 % (ref 0.0–7.0)
HCT: 26.5 % — ABNORMAL LOW (ref 34.8–46.6)
HGB: 8.7 g/dL — ABNORMAL LOW (ref 11.6–15.9)
LYMPH%: 35.6 % (ref 14.0–49.7)
MCH: 28.6 pg (ref 25.1–34.0)
MCHC: 32.8 g/dL (ref 31.5–36.0)
MCV: 87.2 fL (ref 79.5–101.0)
MONO#: 0.1 10*3/uL (ref 0.1–0.9)
MONO%: 4.8 % (ref 0.0–14.0)
NEUT%: 57.7 % (ref 38.4–76.8)
NEUTROS ABS: 1.2 10*3/uL — AB (ref 1.5–6.5)
NRBC: 4 % — AB (ref 0–0)
PLATELETS: 73 10*3/uL — AB (ref 145–400)
RBC: 3.04 10*6/uL — AB (ref 3.70–5.45)
RDW: 15 % — AB (ref 11.2–14.5)
WBC: 2.1 10*3/uL — AB (ref 3.9–10.3)
lymph#: 0.7 10*3/uL — ABNORMAL LOW (ref 0.9–3.3)

## 2017-01-08 LAB — COMPREHENSIVE METABOLIC PANEL
ALT: 23 U/L (ref 0–55)
ANION GAP: 9 meq/L (ref 3–11)
AST: 39 U/L — ABNORMAL HIGH (ref 5–34)
Albumin: 3.5 g/dL (ref 3.5–5.0)
Alkaline Phosphatase: 185 U/L — ABNORMAL HIGH (ref 40–150)
BUN: 26.9 mg/dL — ABNORMAL HIGH (ref 7.0–26.0)
CHLORIDE: 107 meq/L (ref 98–109)
CO2: 25 meq/L (ref 22–29)
Calcium: 9.4 mg/dL (ref 8.4–10.4)
Creatinine: 1.2 mg/dL — ABNORMAL HIGH (ref 0.6–1.1)
EGFR: 60 mL/min/{1.73_m2} — AB (ref >60)
GLUCOSE: 109 mg/dL (ref 70–140)
Potassium: 4.5 mEq/L (ref 3.5–5.1)
SODIUM: 141 meq/L (ref 136–145)
Total Bilirubin: 0.33 mg/dL (ref 0.20–1.20)
Total Protein: 7.8 g/dL (ref 6.4–8.3)

## 2017-01-08 LAB — BPAM RBC
BLOOD PRODUCT EXPIRATION DATE: 201901232359
ISSUE DATE / TIME: 201812290825
Unit Type and Rh: 7300

## 2017-01-08 LAB — TYPE AND SCREEN
ABO/RH(D): B POS
ANTIBODY SCREEN: NEGATIVE
Unit division: 0

## 2017-01-08 LAB — BPAM PLATELET PHERESIS
Blood Product Expiration Date: 201812312359
ISSUE DATE / TIME: 201812290828
Unit Type and Rh: 7300

## 2017-01-08 LAB — TECHNOLOGIST REVIEW

## 2017-01-08 NOTE — Progress Notes (Signed)
46 year old female diagnosed with stage IV breast cancer.  She is ER positive.  She is a patient of Dr. [name].  Past medical history is not significant.  Medications include vitamin D 3, multivitamin, MiraLAX, and Compazine.  Labs include albumin 3.4.  Height: 68 inches. Weight: 113.2 pounds on December 26. Usual body weight: 125-130 pounds per patient. BMI: 17.21. (Underweight)  Patient reports she has nausea however her medications work well. She does complain of taste alterations, the worst being metallic taste. She is afraid to consume soy products and other foods so is limiting her diet. She is using protein powder mixed with whole milk. Patient denies other nutrition impact symptoms.  Nutrition diagnosis:  Food and nutrition related knowledge deficit related to breast cancer and associated treatments as evidenced by no prior need for nutrition related information.  Unintended weight loss related to metastatic breast cancer as evidenced by 13% weight loss from usual body weight.  Intervention: Patient was educated on the importance of smaller more frequent meals and snacks utilizing high protein high calorie foods. Educated patient on strategies for improving metallic taste. Reviewed evidence-based research on soy and breast cancer. Provided patient with a list of organic protein supplements. Fact sheets were given and questions were answered.  Teach back method was used.  Contact information was given.  Monitoring, evaluation, goals: Patient will tolerate increased oral intake to minimize further weight loss.  Next visit: No follow-up is scheduled.  Patient has my contact information.  **Disclaimer: This note was dictated with voice recognition software. Similar sounding words can inadvertently be transcribed and this note may contain transcription errors which may not have been corrected upon publication of note.**

## 2017-01-15 ENCOUNTER — Inpatient Hospital Stay: Payer: BC Managed Care – PPO | Attending: Oncology

## 2017-01-15 DIAGNOSIS — Z170 Estrogen receptor positive status [ER+]: Secondary | ICD-10-CM | POA: Insufficient documentation

## 2017-01-15 DIAGNOSIS — D61810 Antineoplastic chemotherapy induced pancytopenia: Secondary | ICD-10-CM | POA: Diagnosis not present

## 2017-01-15 DIAGNOSIS — D649 Anemia, unspecified: Secondary | ICD-10-CM | POA: Insufficient documentation

## 2017-01-15 DIAGNOSIS — C773 Secondary and unspecified malignant neoplasm of axilla and upper limb lymph nodes: Secondary | ICD-10-CM | POA: Diagnosis not present

## 2017-01-15 DIAGNOSIS — N289 Disorder of kidney and ureter, unspecified: Secondary | ICD-10-CM | POA: Diagnosis not present

## 2017-01-15 DIAGNOSIS — R569 Unspecified convulsions: Secondary | ICD-10-CM | POA: Insufficient documentation

## 2017-01-15 DIAGNOSIS — C50919 Malignant neoplasm of unspecified site of unspecified female breast: Secondary | ICD-10-CM

## 2017-01-15 DIAGNOSIS — C7951 Secondary malignant neoplasm of bone: Secondary | ICD-10-CM | POA: Diagnosis not present

## 2017-01-15 DIAGNOSIS — Z923 Personal history of irradiation: Secondary | ICD-10-CM | POA: Diagnosis not present

## 2017-01-15 DIAGNOSIS — C50411 Malignant neoplasm of upper-outer quadrant of right female breast: Secondary | ICD-10-CM | POA: Diagnosis not present

## 2017-01-15 DIAGNOSIS — Z5111 Encounter for antineoplastic chemotherapy: Secondary | ICD-10-CM | POA: Diagnosis not present

## 2017-01-15 DIAGNOSIS — D696 Thrombocytopenia, unspecified: Secondary | ICD-10-CM | POA: Diagnosis not present

## 2017-01-15 DIAGNOSIS — C7931 Secondary malignant neoplasm of brain: Secondary | ICD-10-CM | POA: Insufficient documentation

## 2017-01-15 LAB — CBC WITH DIFFERENTIAL/PLATELET
ABS GRANULOCYTE: 1.1 10*3/uL — AB (ref 1.5–6.5)
BASOS PCT: 1 %
Basophils Absolute: 0 10*3/uL (ref 0.0–0.1)
Eosinophils Absolute: 0 10*3/uL (ref 0.0–0.5)
Eosinophils Relative: 1 %
HEMATOCRIT: 25.6 % — AB (ref 34.8–46.6)
HEMOGLOBIN: 8.2 g/dL — AB (ref 11.6–15.9)
LYMPHS ABS: 0.7 10*3/uL — AB (ref 0.9–3.3)
LYMPHS PCT: 33 %
MCH: 28.2 pg (ref 25.1–34.0)
MCHC: 32 g/dL (ref 31.5–36.0)
MCV: 88 fL (ref 79.5–101.0)
MONO ABS: 0.3 10*3/uL (ref 0.1–0.9)
MONOS PCT: 14 %
NEUTROS ABS: 1.2 10*3/uL — AB (ref 1.5–6.5)
Neutrophils Relative %: 51 %
Platelets: 60 10*3/uL — ABNORMAL LOW (ref 145–400)
RBC: 2.91 MIL/uL — ABNORMAL LOW (ref 3.70–5.45)
RDW: 15.5 % (ref 11.2–16.1)
WBC: 2.2 10*3/uL — ABNORMAL LOW (ref 3.9–10.3)

## 2017-01-15 LAB — COMPREHENSIVE METABOLIC PANEL
ALBUMIN: 3.8 g/dL (ref 3.5–5.0)
ALT: 26 U/L (ref 0–55)
ANION GAP: 7 (ref 3–11)
AST: 33 U/L (ref 5–34)
Alkaline Phosphatase: 181 U/L — ABNORMAL HIGH (ref 40–150)
BILIRUBIN TOTAL: 0.3 mg/dL (ref 0.2–1.2)
BUN: 22 mg/dL (ref 7–26)
CO2: 25 mmol/L (ref 22–29)
Calcium: 8.9 mg/dL (ref 8.4–10.4)
Chloride: 110 mmol/L — ABNORMAL HIGH (ref 98–109)
Creatinine, Ser: 1.08 mg/dL (ref 0.60–1.10)
GFR calc Af Amer: >60 mL/min (ref >60)
GFR calc non Af Amer: >60 mL/min (ref >60)
GLUCOSE: 95 mg/dL (ref 70–140)
POTASSIUM: 4.7 mmol/L (ref 3.3–4.7)
SODIUM: 142 mmol/L (ref 136–145)
TOTAL PROTEIN: 8.3 g/dL (ref 6.4–8.3)

## 2017-01-15 LAB — SAMPLE TO BLOOD BANK

## 2017-01-18 NOTE — Progress Notes (Signed)
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  She was started on Doxil 12/25/2016, to be repeated every 28 days.  Today is day 1 cycle 2. She reports that her 1st treatment wasn't too bad. She notes that nausea hasn't been too bad, but she has a bad taste in her mouth. She notes that her bowel movements are normal. She denies pain or sore in her mouth. She notes that she doesn't want a port. She notes that her transfusion went well and it hekped  The last visit here she also met with our nutritionist, on 01/08/2017.  The patient was educated on eating smaller more frequent meals, strategies to improve the metallic taste due to chemo, and was provided with a list of organic protein supplements. She rinsed her mouth with half a teaspoon of baking soda which helped. She notes that she has taken ensure once per day and she likes the taste.  She required a blood transfusion 01/10/2017 for a hemoglobin of 7.7.  She also has moderate leukopenia and thrombocytopenia doubtless due to prior radiation treatments  REVIEW OF SYSTEMS: Sherion reports that she relaxed for the holidays. She notes that after she got her treatment, she took it easy and it helped her mentally. She  reports that she is back to work, and so far it has been quite. She denies unusual headaches, visual changes, nausea, vomiting, or dizziness. There has been no unusual cough, phlegm production, or pleurisy. This been no change in bowel or bladder habits. She denies unexplained fatigue or unexplained weight loss, bleeding, rash, or fever. A detailed review of systems was otherwise stable.    BREAST CANCER HISTORY: From doctor Khan's of original intake node 05/31/2011:  "Carolyn [name] is a 47 y.o. female. Without significant past medical history. She underwent a mammogram that showed calcifications measuring 5 cm on the right breast. She then went on To have an ultrasound which showed the area to measure 2.9 cm. She was also found to have a right axillary lymph node that was suspicious for a malignancy. She had a biopsy of the calcifications that showed high-grade ductal carcinoma in situ. 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 In the lymph node biopsy there was no lymphatic tissue seen and was felt that the node was replaced by tumor. Patient went on to have an MRI of the bilateral breasts performed on evening of 05/30/2011. The MRI showed in the right breast 5 x 3 x 4.5 cm mass abutting the chest wall. About 7 cm away from this there was another mass in the upper inner quadrant that measured 1.5 x 1.7 x 1.5 cm. On the contralateral breast, that is the left breast a 2 cm area suspicious enhancement was noted also this up to date has not been biopsied and arrangements are being made  for the biopsy to be performed. In this side there were no suspicious lymph nodes. The prognostic panel is pending. Patient is otherwise without any complaints. "  METASTATIC DISEASE: From the earlier summary note:  Madissen noted some strange feelings around her umbilicus 97/94/8016. This felt like an area of numbness. Over the next 2 days she noted some leg weakness and difficulty  walking, so she presented to the ED 04/12/2014. MRI of the thoraco-lumbar spine was obtained 04/12/2014 showing multiple bone lesions and compression fracture at T8 with retropulsion and cord compression. On 04/13/2014 she underwent Laminectomy T8 decompression of spinal cord posterior fixation from T6-T10 with pedicle screws and rods posterior arthrodesis with allograft. The pathology from this procedure (SZA [phone] [phone]) showed metastatic adenocarcinoma which was estrogen receptor 69% positive, with moderate staining intensity, progesterone receptor negative. HER-2 could not be obtained.  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 There were nonspecific L temporal bone changes and also possible involvement of the clivus and calvarium, but no other parenchymal brain lesions. Further staging studies included a bone scan, which failed to show the lytic lesion seen on other scans, and CTs of the chest, abdomen and pelvis on 06/08/2011, which showed very small right lung and left liver lesions which will require follow-up  Her subsequent history is as detailed below   PAST MEDICAL HISTORY: Past Medical History:  Diagnosis Date  . Breast cancer ([place]) 09/2011   invasive ductal carcinoma metastatic ca in 3/14 lymph nodes  . History of chemotherapy 06/27/11 -09/04/11   neoadjuvant  . Hx of radiation therapy 11/08/11 -12/26/11   right chest wall/supraclav fossa, right scar  . S/P radiation therapy 05/08/14   SRS brain    PAST SURGICAL HISTORY: Past Surgical History:  Procedure Laterality Date  . brain surgery-LITT Left 11/20/14   LITT procedure for Lt brain met.  Marland Kitchen BREAST BIOPSY     Left  . CHOLECYSTECTOMY N/A 03/01/2014   Procedure: LAPAROSCOPIC CHOLECYSTECTOMY;  Surgeon: Leighton Ruff, MD;  Location: [facility];  Service: General;  Laterality: N/A;  . LAMINECTOMY N/A 04/13/2014   Procedure: THORACIC  LAMINECTOMY WITH FIXATION THORACIC SIX-THORACIC TEN FUSION;  Surgeon: Kristeen Miss, MD;  Location: [facility];  Service: Neurosurgery;  Laterality: N/A;  . MODIFIED MASTECTOMY  10/03/2011   Procedure: MODIFIED MASTECTOMY;  Surgeon: Haywood Lasso, MD;  Location: [facility];  Service: General;  Laterality: Right;  . PORT-A-CATH REMOVAL  01/30/2012   Procedure: MINOR REMOVAL PORT-A-CATH;  Surgeon: Haywood Lasso, MD;  Location: [facility];  Service: General;  Laterality: Left;  . PORTACATH PLACEMENT  06/20/2011   Procedure: INSERTION PORT-A-CATH;  Surgeon: Haywood Lasso, MD;  Location: [facility];  Service: General;  Laterality: N/A;  . ROBOTIC ASSISTED TOTAL HYSTERECTOMY WITH BILATERAL SALPINGO OOPHERECTOMY Bilateral 12/23/2012   Procedure: ROBOTIC ASSISTED TOTAL HYSTERECTOMY WITH BILATERAL SALPINGO OOPHORECTOMY;  Surgeon: Marvene Staff, MD;  Location: [facility];  Service: Gynecology;  Laterality: Bilateral;  . WISDOM TOOTH EXTRACTION      FAMILY HISTORY Family History  Problem Relation Age of Onset  . Breast cancer Maternal Aunt 37  . Pancreatic cancer Maternal Grandfather   . Pancreatic cancer Maternal Aunt        died in her 75s  . Leukemia Maternal Aunt        died in her 61s  . Ovarian cancer Cousin  10       maternal cousin   the patient's father is alive, currently 53 years old. The patient's mother died from complications of diabetes at the age of 2. The patient had no brothers, 4 sisters. One sister has died from congestive heart failure. The patient's mother is one of 5 sisters. One of them was diagnosed with breast cancer the age of 80. Also a cousin on the maternal side it was diagnosed with ovarian cancer. This was approximately age 67. There is also colon cancer in the family. The patient has had extensive genetic testing summarized below. She is however BRCA negative  GYNECOLOGIC HISTORY:  Patient's last menstrual period was 07/05/2011  (exact date). Menarche age 86, she is GX P0. She underwent total hysterectomy with bilateral salpingo-oophorectomy December 2014.  SOCIAL HISTORY:  Taliana works at [employer] for [employer]. She mostly deals with government documents. She is single, lives by herself, with no pets. She attends [employer] locally.    ADVANCED DIRECTIVES: Not in place. The patient has a documents and intends to name her sister Mildreth Reek as healthcare power of attorney. Joelene Millin lives in [place] and can be reached at [place]: Social History   Tobacco Use  . Smoking status: Never Smoker  . Smokeless tobacco: Never Used  Substance Use Topics  . Alcohol use: No  . Drug use: No     Colonoscopy:  PAP:  Bone density:  Lipid panel:  Allergies  Allergen Reactions  . Allegra [Fexofenadine] Hives    Abdomen only  . Cat Hair Extract Other (See Comments)  . Shellfish Allergy Hives    Abdomen only  . Glucosamine Rash    Current Outpatient Medications  Medication Sig Dispense Refill  . Cholecalciferol (VITAMIN D3) 2000 units TABS Take by mouth daily.    Marland Kitchen gabapentin (NEURONTIN) 100 MG capsule TAKE 1 TO 3 CAPSULES BY MOUTH AT BEDTIME FOR NEUROPATHY 90 capsule 0  . Multiple Vitamin (MULTIVITAMIN) tablet Take 1 tablet by mouth daily.    . ondansetron (ZOFRAN) 8 MG tablet Take 1 tablet (8 mg total) by mouth every 8 (eight) hours as needed for nausea or vomiting. 30 tablet 1  . polyethylene glycol (MIRALAX / GLYCOLAX) packet Take 17 g by mouth daily. 14 each 0  . prochlorperazine (COMPAZINE) 10 MG tablet Take 1 tablet (10 mg total) by mouth every 6 (six) hours as needed (Nausea or vomiting). 30 tablet 1  . traMADol (ULTRAM) 50 MG tablet Take 1 tablet (50 mg total) by mouth every 6 (six) hours as needed. 60 tablet 3   No current facility-administered medications for this visit.     OBJECTIVE: Young African-American woman using a cane  Vitals:   01/22/17 1351  BP: 121/61    Pulse: (!) 108  Resp: (!) 24  Temp: 98 F (36.7 C)  SpO2: 96%     Body mass index is 16.85 kg/m.    ECOG FS:1 - Symptomatic but completely ambulatory   Sclerae unicteric, EOMs intact Oropharynx clear and moist No cervical or supraclavicular adenopathy Lungs no rales or rhonchi Heart regular rate and rhythm Abd soft, nontender, positive bowel sounds MSK no focal spinal tenderness, no upper extremity lymphedema Neuro: nonfocal, well oriented, appropriate affect Breasts: The right breast is status post mastectomy.  There is no evidence of chest wall recurrence.  Left breast is benign.  Both axillae are benign   LAB RESULTS:  CMP     Component Value  Date/Time   NA 142 01/15/2017 0904   NA 141 01/08/2017 0842   K 4.7 01/15/2017 0904   K 4.5 01/08/2017 0842   CL 110 (H) 01/15/2017 0904   CL 102 01/16/2012 0804   CO2 25 01/15/2017 0904   CO2 25 01/08/2017 0842   GLUCOSE 95 01/15/2017 0904   GLUCOSE 109 01/08/2017 0842   GLUCOSE 92 01/16/2012 0804   BUN 22 01/15/2017 0904   BUN 26.9 (H) 01/08/2017 0842   CREATININE 1.08 01/15/2017 0904   CREATININE 1.2 (H) 01/08/2017 0842   CALCIUM 8.9 01/15/2017 0904   CALCIUM 9.4 01/08/2017 0842   PROT 8.3 01/15/2017 0904   PROT 7.8 01/08/2017 0842   ALBUMIN 3.8 01/15/2017 0904   ALBUMIN 3.5 01/08/2017 0842   AST 33 01/15/2017 0904   AST 39 (H) 01/08/2017 0842   ALT 26 01/15/2017 0904   ALT 23 01/08/2017 0842   ALKPHOS 181 (H) 01/15/2017 0904   ALKPHOS 185 (H) 01/08/2017 0842   BILITOT 0.3 01/15/2017 0904   BILITOT 0.33 01/08/2017 0842   GFRNONAA >60 01/15/2017 0904   GFRAA >60 01/15/2017 0904    I No results found for: SPEP  Lab Results  Component Value Date   WBC 3.5 (L) 01/22/2017   NEUTROABS 2.1 01/22/2017   HGB 7.1 (L) 01/22/2017   HCT 22.2 (L) 01/22/2017   MCV 88.4 01/22/2017   PLT PENDING 01/22/2017      Chemistry      Component Value Date/Time   NA 142 01/15/2017 0904   NA 141 01/08/2017 0842   K 4.7  01/15/2017 0904   K 4.5 01/08/2017 0842   CL 110 (H) 01/15/2017 0904   CL 102 01/16/2012 0804   CO2 25 01/15/2017 0904   CO2 25 01/08/2017 0842   BUN 22 01/15/2017 0904   BUN 26.9 (H) 01/08/2017 0842   CREATININE 1.08 01/15/2017 0904   CREATININE 1.2 (H) 01/08/2017 0842      Component Value Date/Time   CALCIUM 8.9 01/15/2017 0904   CALCIUM 9.4 01/08/2017 0842   ALKPHOS 181 (H) 01/15/2017 0904   ALKPHOS 185 (H) 01/08/2017 0842   AST 33 01/15/2017 0904   AST 39 (H) 01/08/2017 0842   ALT 26 01/15/2017 0904   ALT 23 01/08/2017 0842   BILITOT 0.3 01/15/2017 0904   BILITOT 0.33 01/08/2017 0842       Lab Results  Component Value Date   LABCA2 24 05/31/2011    No components found for: [id number]  No results for input(s): INR in the last 168 hours.  Urinalysis    Component Value Date/Time   COLORURINE STRAW (A) 09/05/2016 2000    STUDIES: Brain MRI scheduled for 02/20/2017  ASSESSMENT: 47 y.o. BRCA negative [place] woman status post right breast upper outer quadrant and right axillary lymph node biopsy 05/18/2011, both positive for an invasive ductal carcinoma, high-grade, clinicallyT2 N1-2 or stage IIB/IIIA,  estrogen receptor 100% positive, progesterone receptor 87% positive, with an MIB-1 of 14% and no HER-2 amplification ([id number]).  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   (a) VUS were also found in [place] and BARD1  (2) additional right breast biopsy upper inner quadrant 06/08/2011 showed only a fibroadenoma, and central left breast biopsy for another suspicious lesion showed only fibrocystic changes ([id number] and [id number]).   (3)Treated neoadjuvantly with cyclophosphamide and docetaxel x4 completed 08/29/2011.  (4) status post right modified radical mastectomy 10/03/2011 showing a residual  pT1c pN1a (3/18 lymph nodes positive) invasive ductal carcinoma, grade 1,estrogen receptor 100% positive,  progesterone receptor negative, with no HER-2 amplification ([id number])  (5) adjuvant radiation to the right chest wall, right supraclavicular fossa and right scar completed 12/26/2011  (6) tamoxifen started January 2014-- discontinued April 2016 with evidence of metastatic disease  (7) status post total hysterectomy with bilateral salpingo-oophorectomy 12/23/2012 with benign pathology ([id number])  METASTATIC DISEASE 04/13/2014 (8) presented with T8 cord compression and underwent laminectomy 04/13/2014 with T8 decompression of spinal cord, posterior fixation from T6-T10 with pedicle screws and rods, posterior arthrodesis with allograft. 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 ExacTrac Snap verification was performed for each couch angle.   (b) radiation to the T-spine completed 05/08/2014.  (c) [facility]  (d) SRS to 0.4 cm left parietal lesion 12/23/2015  (11) started fulvestrant 05/18/2014 and Palbociclib 06/01/2014  (a) Palbociclib dose decreased to 100 mg daily, 21/7 as of 08/05/2014  (b) palbociclib dose decreased to 75 mg daily, 21/7, starting May 2017  (c) palbociclib dose decreased to 75 mg every other day beginning 07/26/2015    (d) palbociclib held for month of October because of persistent low counts, resumed November  (e) fulvestrant discontinued after 11/02/2016 dose: Anastrozole started 08/01/2016    (12) started zolendronate 05/18/2014, repeated every  12 weeks  (a) July zolendronate dose held because of dental issues, resumed 10/18/2015  (b) switched to denosumab as of 01/20/2016 with concern of bony progression on zolendronate  (c) switching back to zolendronate as of April 2018 at patient's request  (d) switched to denosumab/Xgeva because of hypercalcemia, first dose 09/13/2016  (13) most recent staging studies:  (a) brain MRI 07/13/2016 shows stable to smaller treated lesions.  (b) bone scan 04/06/2016 shows no new lesions  (c) chest CT scan 04/06/2016 shows small bilateral pulmonary nodules with no evidence of disease progression  (d) MRI of the thoracolumbar spine shows diffuse bony metastases particularly involving T8, T11 and T12, but without epidural disease. There were subcentimeter pulmonary nodules and indeterminate liver lesions. CT scan of the left hip 09/06/2016 showed multiple bony metastases but no fracture  (e) PET scan 12/08/2016 and liver MRI 12/04/2016 shows several liver lesions at least 1 of which is hypermetabolic (1.4 cm, SUV max 3.9)  (14) anastrozole resumed 10/20/2016, discontinued 12/13/2016  (a) everolimus started 09/25/2016, discontinued 12/06/2016  (15) palliative radiation to the right hip completed 09/26/2016  (16) started Doxil 12/25/2016, to be repeated every 28 days (after 3-4 cycles of Doxil will restage with an MRI of the liver and likely repeat PET scan, MRI brain repeat due mid January 2019)  (a) baseline echocardiogram on 12/22/16 shows LVEF of 55-60%  (17) pancytopenia, secondary to chemotherapy and the cancer itself as well as renal insufficiency  (18) severe protein calorie malnutrition    PLAN: WeTesslas now 2-1/2 years out from initial diagnosis of metastatic breast cancer.  She is currently on Doxil, and will receive her second cycle today.  Her pancytopenia is likely due to cancer involvement in  the marrow, but also possibly to chemotherapy, and finally possibly because of renal  insufficiency.  She may benefit from Aranesp as we are having to repeatedly transfuse her in the absence of any evidence of bleeding.  She maintains a very positive attitude, continues to work, denies pain.  She does need to gain weight.  She is very motivated regarding this then met with our nutritionist and understands that she needs to eat more high caloric foods.  We went over that in detail today.  The brain metastases will be reevaluated later this week.  She is going to return to see me in a week.  Once she completes 3 cycles minimum of Doxil she will be restaged  She knows to call for any problems that may develop before the next visit.  Greydon Betke, Virgie Dad, MD  01/22/17 2:17 PM Medical Oncology and Hematology [facility] [address] [place], [place] [address] Tel. [phone]    Fax. [phone]  This document serves as a record of services personally performed by Lurline Del, MD. It was created on his behalf by Sheron Nightingale, a trained medical scribe. The creation of this record is based on the scribe's personal observations and the provider's statements to them.   I have reviewed the above documentation for accuracy and completeness, and I agree with the above.

## 2017-01-18 NOTE — Progress Notes (Signed)
Carolyn [name] stage IV metastatic ER PR positive, HER-2 negative,invasive carcinoma of the right breastwith metastatic disease to the brain and spine completed radiation 09-26-16, review 01-25-17 MRI brain w wo contrast,FU.  Pain:None Fatigue:Mild Appetite:Okay Nausea/Vomiting: Some nausea but,took medication Bowel/Bladder issues:Sometime she gets constipated,takes miralax for relief.No issues with her bladder Weight:gain a pound 12- 17- 18 Saw Dr. Magrinat start Doxil chemotherapy 12/25/2016 (plan would be for her to receive 3-4 cycles of Doxil and then restage with an MRI of the liver and likely repeat PET scan, MRI brain repeat due mid January 2019)  baseline echocardiogram on 12/22/16 shows LVEF of 55-60%.  Her hemoglobin is 6.7 and she has minimal symptoms    Vitals:   01/29/17 0955  BP: 115/69  Pulse: 89  Resp: 20  Temp: 98.1 F (36.7 C)  TempSrc: Oral  SpO2: 100%  Weight: 111 lb 2 oz (50.4 kg)   Wt Readings from Last 3 Encounters:  01/29/17 111 lb 2 oz (50.4 kg)  01/22/17 110 lb 12.8 oz (50.3 kg)  01/03/17 113 lb 3.2 oz (51.3 kg)    

## 2017-01-22 ENCOUNTER — Telehealth: Payer: Self-pay | Admitting: Oncology

## 2017-01-22 ENCOUNTER — Telehealth: Payer: Self-pay | Admitting: *Deleted

## 2017-01-22 ENCOUNTER — Inpatient Hospital Stay (HOSPITAL_BASED_OUTPATIENT_CLINIC_OR_DEPARTMENT_OTHER): Payer: BC Managed Care – PPO | Admitting: Oncology

## 2017-01-22 ENCOUNTER — Inpatient Hospital Stay: Payer: BC Managed Care – PPO

## 2017-01-22 ENCOUNTER — Ambulatory Visit (HOSPITAL_COMMUNITY)
Admission: RE | Admit: 2017-01-22 | Discharge: 2017-01-22 | Disposition: A | Discharge status: Home / Self Care | Payer: BC Managed Care – PPO | Source: Ambulatory Visit | Attending: Oncology | Admitting: Oncology

## 2017-01-22 VITALS — BP 121/61 | HR 108 | Temp 98.0°F | Resp 24 | Ht 68.0 in | Wt 110.8 lb

## 2017-01-22 DIAGNOSIS — C50911 Malignant neoplasm of unspecified site of right female breast: Secondary | ICD-10-CM

## 2017-01-22 DIAGNOSIS — C7931 Secondary malignant neoplasm of brain: Secondary | ICD-10-CM

## 2017-01-22 DIAGNOSIS — N179 Acute kidney failure, unspecified: Secondary | ICD-10-CM | POA: Diagnosis not present

## 2017-01-22 DIAGNOSIS — C7951 Secondary malignant neoplasm of bone: Secondary | ICD-10-CM | POA: Diagnosis not present

## 2017-01-22 DIAGNOSIS — Z170 Estrogen receptor positive status [ER+]: Secondary | ICD-10-CM | POA: Diagnosis not present

## 2017-01-22 DIAGNOSIS — D61818 Other pancytopenia: Secondary | ICD-10-CM | POA: Diagnosis not present

## 2017-01-22 DIAGNOSIS — G893 Neoplasm related pain (acute) (chronic): Secondary | ICD-10-CM | POA: Diagnosis not present

## 2017-01-22 DIAGNOSIS — D649 Anemia, unspecified: Secondary | ICD-10-CM | POA: Diagnosis present

## 2017-01-22 DIAGNOSIS — C50411 Malignant neoplasm of upper-outer quadrant of right female breast: Secondary | ICD-10-CM

## 2017-01-22 DIAGNOSIS — E440 Moderate protein-calorie malnutrition: Secondary | ICD-10-CM

## 2017-01-22 DIAGNOSIS — E43 Unspecified severe protein-calorie malnutrition: Secondary | ICD-10-CM | POA: Insufficient documentation

## 2017-01-22 DIAGNOSIS — G959 Disease of spinal cord, unspecified: Secondary | ICD-10-CM | POA: Diagnosis not present

## 2017-01-22 DIAGNOSIS — C50919 Malignant neoplasm of unspecified site of unspecified female breast: Secondary | ICD-10-CM

## 2017-01-22 LAB — COMPREHENSIVE METABOLIC PANEL
ALBUMIN: 3.8 g/dL (ref 3.5–5.0)
ALT: 27 U/L (ref 0–55)
ANION GAP: 13 — AB (ref 3–11)
AST: 38 U/L — ABNORMAL HIGH (ref 5–34)
Alkaline Phosphatase: 164 U/L — ABNORMAL HIGH (ref 40–150)
BILIRUBIN TOTAL: 0.4 mg/dL (ref 0.2–1.2)
BUN: 38 mg/dL — ABNORMAL HIGH (ref 7–26)
CHLORIDE: 108 mmol/L (ref 98–109)
CO2: 24 mmol/L (ref 22–29)
Calcium: 9.1 mg/dL (ref 8.4–10.4)
Creatinine, Ser: 1.5 mg/dL — ABNORMAL HIGH (ref 0.60–1.10)
GFR calc Af Amer: 47 mL/min — ABNORMAL LOW (ref >60)
GFR, EST NON AFRICAN AMERICAN: 40 mL/min — AB (ref >60)
Glucose, Bld: 104 mg/dL (ref 70–140)
POTASSIUM: 4.3 mmol/L (ref 3.3–4.7)
Sodium: 145 mmol/L (ref 136–145)
TOTAL PROTEIN: 8 g/dL (ref 6.4–8.3)

## 2017-01-22 LAB — CBC WITH DIFFERENTIAL/PLATELET
BASOS PCT: 3 %
Basophils Absolute: 0.1 10*3/uL (ref 0.0–0.1)
Eosinophils Absolute: 0 10*3/uL (ref 0.0–0.5)
Eosinophils Relative: 0 %
HEMATOCRIT: 22.2 % — AB (ref 34.8–46.6)
HEMOGLOBIN: 7.1 g/dL — AB (ref 11.6–15.9)
LYMPHS PCT: 27 %
Lymphs Abs: 1 10*3/uL (ref 0.9–3.3)
MCH: 28.3 pg (ref 25.1–34.0)
MCHC: 32 g/dL (ref 31.5–36.0)
MCV: 88.4 fL (ref 79.5–101.0)
Monocytes Absolute: 0.4 10*3/uL (ref 0.1–0.9)
Monocytes Relative: 11 %
NEUTROS ABS: 2.1 10*3/uL (ref 1.5–6.5)
Neutrophils Relative %: 59 %
Platelets: 38 10*3/uL — ABNORMAL LOW (ref 145–400)
RBC: 2.51 MIL/uL — AB (ref 3.70–5.45)
RDW: 16 % (ref 11.2–16.1)
WBC: 3.5 10*3/uL — AB (ref 3.9–10.3)

## 2017-01-22 LAB — SAMPLE TO BLOOD BANK

## 2017-01-22 MED ORDER — DOXORUBICIN HCL LIPOSOMAL CHEMO INJECTION 2 MG/ML
40.0000 mg/m2 | Freq: Once | INTRAVENOUS | Status: AC
  Administered 2017-01-22: 64 mg via INTRAVENOUS
  Filled 2017-01-22: qty 25

## 2017-01-22 MED ORDER — DEXAMETHASONE SODIUM PHOSPHATE 10 MG/ML IJ SOLN
INTRAMUSCULAR | Status: AC
  Filled 2017-01-22: qty 1

## 2017-01-22 MED ORDER — DEXAMETHASONE SODIUM PHOSPHATE 10 MG/ML IJ SOLN
10.0000 mg | Freq: Once | INTRAMUSCULAR | Status: AC
  Administered 2017-01-22: 10 mg via INTRAVENOUS

## 2017-01-22 MED ORDER — DEXTROSE 5 % IV SOLN
Freq: Once | INTRAVENOUS | Status: AC
  Administered 2017-01-22: 15:00:00 via INTRAVENOUS

## 2017-01-22 NOTE — Addendum Note (Signed)
Addended by: Chauncey Cruel on: 01/22/2017 05:37 PM   Modules accepted: Orders

## 2017-01-22 NOTE — Telephone Encounter (Signed)
Scheduled appt per 1/14 los - Patient is aware of appt date and time.

## 2017-01-22 NOTE — Patient Instructions (Signed)
[facility] Discharge Instructions for Patients Receiving Chemotherapy  Today you received the following chemotherapy agents Doxorubicin   To help prevent nausea and vomiting after your treatment, we encourage you to take your nausea medication per MD instructions. If you develop nausea and vomiting that is not controlled by your nausea medication, call the clinic.   BELOW ARE SYMPTOMS THAT SHOULD BE REPORTED IMMEDIATELY:  *FEVER GREATER THAN 100.5 F  *CHILLS WITH OR WITHOUT FEVER  NAUSEA AND VOMITING THAT IS NOT CONTROLLED WITH YOUR NAUSEA MEDICATION  *UNUSUAL SHORTNESS OF BREATH  *UNUSUAL BRUISING OR BLEEDING  TENDERNESS IN MOUTH AND THROAT WITH OR WITHOUT PRESENCE OF ULCERS  *URINARY PROBLEMS  *BOWEL PROBLEMS  UNUSUAL RASH Items with * indicate a potential emergency and should be followed up as soon as possible.  Feel free to call the clinic should you have any questions or concerns. The clinic phone number is [phone].  Please show the [facility] at check-in to the Emergency Department and triage nurse.  Doxorubicin injection What is this medicine? DOXORUBICIN (dox oh ROO bi sin) is a chemotherapy drug. It is used to treat many kinds of cancer like leukemia, lymphoma, neuroblastoma, sarcoma, and Wilms' tumor. It is also used to treat bladder cancer, breast cancer, lung cancer, ovarian cancer, stomach cancer, and thyroid cancer. This medicine may be used for other purposes; ask your health care provider or pharmacist if you have questions. COMMON BRAND NAME(S): Adriamycin, Adriamycin PFS, Adriamycin RDF, Rubex What should I tell my health care provider before I take this medicine? They need to know if you have any of these conditions: -heart disease -history of low blood counts caused by a medicine -liver disease -recent or ongoing radiation therapy -an unusual or allergic reaction to doxorubicin, other chemotherapy agents, other medicines,  foods, dyes, or preservatives -pregnant or trying to get pregnant -breast-feeding How should I use this medicine? This drug is given as an infusion into a vein. It is administered in a hospital or clinic by a specially trained health care professional. If you have pain, swelling, burning or any unusual feeling around the site of your injection, tell your health care professional right away. Talk to your pediatrician regarding the use of this medicine in children. Special care may be needed. Overdosage: If you think you have taken too much of this medicine contact a poison control center or emergency room at once. NOTE: This medicine is only for you. Do not share this medicine with others. What if I miss a dose? It is important not to miss your dose. Call your doctor or health care professional if you are unable to keep an appointment. What may interact with this medicine? This medicine may interact with the following medications: -6-mercaptopurine -paclitaxel -phenytoin -St. John's Wort -trastuzumab -verapamil This list may not describe all possible interactions. Give your health care provider a list of all the medicines, herbs, non-prescription drugs, or dietary supplements you use. Also tell them if you smoke, drink alcohol, or use illegal drugs. Some items may interact with your medicine. What should I watch for while using this medicine? This drug may make you feel generally unwell. This is not uncommon, as chemotherapy can affect healthy cells as well as cancer cells. Report any side effects. Continue your course of treatment even though you feel ill unless your doctor tells you to stop. There is a maximum amount of this medicine you should receive throughout your life. The amount depends on the medical  condition being treated and your overall health. Your doctor will watch how much of this medicine you receive in your lifetime. Tell your doctor if you have taken this medicine before. You  may need blood work done while you are taking this medicine. Your urine may turn red for a few days after your dose. This is not blood. If your urine is dark or brown, call your doctor. In some cases, you may be given additional medicines to help with side effects. Follow all directions for their use. Call your doctor or health care professional for advice if you get a fever, chills or sore throat, or other symptoms of a cold or flu. Do not treat yourself. This drug decreases your body's ability to fight infections. Try to avoid being around people who are sick. This medicine may increase your risk to bruise or bleed. Call your doctor or health care professional if you notice any unusual bleeding. Talk to your doctor about your risk of cancer. You may be more at risk for certain types of cancers if you take this medicine. Do not become pregnant while taking this medicine or for 6 months after stopping it. Women should inform their doctor if they wish to become pregnant or think they might be pregnant. Men should not father a child while taking this medicine and for 6 months after stopping it. There is a potential for serious side effects to an unborn child. Talk to your health care professional or pharmacist for more information. Do not breast-feed an infant while taking this medicine. This medicine has caused ovarian failure in some women and reduced sperm counts in some men This medicine may interfere with the ability to have a child. Talk with your doctor or health care professional if you are concerned about your fertility. What side effects may I notice from receiving this medicine? Side effects that you should report to your doctor or health care professional as soon as possible: -allergic reactions like skin rash, itching or hives, swelling of the face, lips, or tongue -breathing problems -chest pain -fast or irregular heartbeat -low blood counts - this medicine may decrease the number of white  blood cells, red blood cells and platelets. You may be at increased risk for infections and bleeding. -pain, redness, or irritation at site where injected -signs of infection - fever or chills, cough, sore throat, pain or difficulty passing urine -signs of decreased platelets or bleeding - bruising, pinpoint red spots on the skin, black, tarry stools, blood in the urine -swelling of the ankles, feet, hands -tiredness -weakness Side effects that usually do not require medical attention (report to your doctor or health care professional if they continue or are bothersome): -diarrhea -hair loss -mouth sores -nail discoloration or damage -nausea -red colored urine -vomiting This list may not describe all possible side effects. Call your doctor for medical advice about side effects. You may report side effects to FDA at 1-800-FDA-1088. Where should I keep my medicine? This drug is given in a hospital or clinic and will not be stored at home. NOTE: This sheet is a summary. It may not cover all possible information. If you have questions about this medicine, talk to your doctor, pharmacist, or health care provider.  2018 [employer] (2015-02-22 11:28:51)

## 2017-01-22 NOTE — Telephone Encounter (Signed)
Per MD ok to proceed with treatment today with heme of 7.2 and plts of 39,000.

## 2017-01-22 NOTE — Progress Notes (Signed)
Pt okay to treat with Hg 7.1 and bun/cr abnormal. Per Dr.Magrinat.

## 2017-01-23 ENCOUNTER — Inpatient Hospital Stay: Payer: BC Managed Care – PPO

## 2017-01-23 ENCOUNTER — Other Ambulatory Visit: Payer: Self-pay

## 2017-01-23 DIAGNOSIS — C50911 Malignant neoplasm of unspecified site of right female breast: Secondary | ICD-10-CM

## 2017-01-23 DIAGNOSIS — C50919 Malignant neoplasm of unspecified site of unspecified female breast: Secondary | ICD-10-CM

## 2017-01-23 DIAGNOSIS — N179 Acute kidney failure, unspecified: Secondary | ICD-10-CM

## 2017-01-23 DIAGNOSIS — C50411 Malignant neoplasm of upper-outer quadrant of right female breast: Secondary | ICD-10-CM

## 2017-01-23 DIAGNOSIS — G959 Disease of spinal cord, unspecified: Secondary | ICD-10-CM

## 2017-01-23 DIAGNOSIS — G893 Neoplasm related pain (acute) (chronic): Secondary | ICD-10-CM

## 2017-01-23 DIAGNOSIS — Z170 Estrogen receptor positive status [ER+]: Secondary | ICD-10-CM

## 2017-01-23 DIAGNOSIS — D61818 Other pancytopenia: Secondary | ICD-10-CM

## 2017-01-23 DIAGNOSIS — E440 Moderate protein-calorie malnutrition: Secondary | ICD-10-CM

## 2017-01-23 DIAGNOSIS — C7931 Secondary malignant neoplasm of brain: Principal | ICD-10-CM

## 2017-01-23 DIAGNOSIS — E43 Unspecified severe protein-calorie malnutrition: Secondary | ICD-10-CM

## 2017-01-23 DIAGNOSIS — C7951 Secondary malignant neoplasm of bone: Secondary | ICD-10-CM

## 2017-01-23 LAB — RETICULOCYTES
RBC.: 2.27 MIL/uL — ABNORMAL LOW (ref 3.70–5.45)
RETIC CT PCT: 2.5 % — AB (ref 0.7–2.1)
Retic Count, Absolute: 56.8 10*3/uL (ref 33.7–90.7)

## 2017-01-23 LAB — FERRITIN: FERRITIN: 3528 ng/mL — AB (ref 9–269)

## 2017-01-23 LAB — PREPARE RBC (CROSSMATCH)

## 2017-01-23 LAB — ABO/RH: ABO/RH(D): B POS

## 2017-01-23 MED ORDER — SODIUM CHLORIDE 0.9% FLUSH
3.0000 mL | INTRAVENOUS | Status: DC | PRN
  Filled 2017-01-23: qty 10

## 2017-01-23 MED ORDER — ACETAMINOPHEN 325 MG PO TABS: 650.0000 mg | ORAL_TABLET | Freq: Once | ORAL | Status: DC

## 2017-01-23 MED ORDER — ACETAMINOPHEN 325 MG PO TABS
ORAL_TABLET | ORAL | Status: AC
  Filled 2017-01-23: qty 2

## 2017-01-23 MED ORDER — SODIUM CHLORIDE 0.9 % IV SOLN
250.0000 mL | Freq: Once | INTRAVENOUS | Status: AC
  Administered 2017-01-23: 250 mL via INTRAVENOUS

## 2017-01-23 MED ORDER — ACETAMINOPHEN 325 MG PO TABS
ORAL_TABLET | ORAL | Status: AC
  Filled 2017-01-23: qty 1

## 2017-01-23 MED ORDER — DIPHENHYDRAMINE HCL 25 MG PO CAPS
ORAL_CAPSULE | ORAL | Status: AC
  Filled 2017-01-23: qty 1

## 2017-01-23 MED ORDER — DIPHENHYDRAMINE HCL 25 MG PO TABS
25.0000 mg | ORAL_TABLET | Freq: Once | ORAL | Status: DC
  Filled 2017-01-23: qty 1

## 2017-01-23 MED ORDER — ACETAMINOPHEN 325 MG PO TABS
650.0000 mg | ORAL_TABLET | Freq: Once | ORAL | Status: AC
  Administered 2017-01-23: 650 mg via ORAL

## 2017-01-23 MED ORDER — DIPHENHYDRAMINE HCL 25 MG PO CAPS
25.0000 mg | ORAL_CAPSULE | Freq: Once | ORAL | Status: AC
  Administered 2017-01-23: 25 mg via ORAL

## 2017-01-23 NOTE — Progress Notes (Signed)
Pt verbalized she would only like 1 unit of blood today. She feels extremely tired after 2 units and cannot come back for the 2nd unit this week. Pt has requested to find out the results of iron studies and then discuss need for additional blood transfusions.

## 2017-01-23 NOTE — Patient Instructions (Signed)

## 2017-01-25 ENCOUNTER — Ambulatory Visit
Admission: RE | Admit: 2017-01-25 | Discharge: 2017-01-25 | Disposition: A | Discharge status: Home / Self Care | Payer: BC Managed Care – PPO | Source: Ambulatory Visit | Attending: Radiation Oncology | Admitting: Radiation Oncology

## 2017-01-25 DIAGNOSIS — C7931 Secondary malignant neoplasm of brain: Secondary | ICD-10-CM

## 2017-01-25 DIAGNOSIS — C7949 Secondary malignant neoplasm of other parts of nervous system: Principal | ICD-10-CM

## 2017-01-25 MED ORDER — GADOBENATE DIMEGLUMINE 529 MG/ML IV SOLN
10.0000 mL | Freq: Once | INTRAVENOUS | Status: AC | PRN
  Administered 2017-01-25: 10 mL via INTRAVENOUS

## 2017-01-25 NOTE — Progress Notes (Signed)
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   She receives Doxil every 28 days with her next dose due 02/19/2017.  She also receives [place] every 28 days.  Her next dose was due to be 01/31/2017, but we are starting Aranesp today and repeating it every 2 weeks, so I have moved her next Xgeva dose to 02/12/2017.  Importantly, since her last visit here she completed a MRI of the brain on 01/26/2016 showing: Progression of disease since 10/19/2016. 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 Associated hypothalamic edema without significant regional mass effect. 5 millimeter left inferior occipital lobe. Mild edema with no mass effect. Enlargement of the treated right inferior parietal lobe metastasis from 4 mm now to 12-13 mm with new confluent vasogenic edema and mild regional mass effect. However, the appearance is complicated by evidence of interval hemorrhage within the lesion. Still in light of #2, the appearance is suspicious for true progression. Progressive osseous metastatic disease since July 2018. It is possible that the pituitary involvement is via contiguous spread from the  skull base. The treated left periatrial lesion remains stable   REVIEW OF SYSTEMS: Lalanya reports that her brain metastses will be treated through an open facemask procedure with follow up with Dr. Lisbeth Renshaw. She notes that she gets claustrophobia within the MRI scan. She notes that she will be set up to see an endocrinologist. She notes that her dad will be 53 years old as of February 2019. She notes that she has been eating more, and she gained a pound. She notes that she has been eating fatty foods. She denies any additional headaches and visual changes that are not already usual, nausea, vomiting, or dizziness. There has been no unusual cough, phlegm production, or pleurisy. This been no change in bowel or bladder habits. She denies unexplained fatigue or unexplained weight loss, bleeding, rash, or fever. A detailed review of systems was otherwise stable.    BREAST CANCER HISTORY: From doctor Khan's of original intake node 05/31/2011:  "Carolyn [name] is a 47 y.o. female. Without significant past medical history. She underwent a mammogram that showed calcifications measuring 5 cm on the right breast. She then went on To have an ultrasound which showed the area to measure 2.9 cm. She was also found to have a right axillary lymph node that was suspicious for a malignancy. She had a biopsy of the calcifications that showed high-grade ductal carcinoma in situ. 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 In the lymph node biopsy there was no lymphatic tissue seen and was felt that the node was replaced by tumor. Patient went on to have an MRI of the bilateral breasts performed on evening of 05/30/2011. The MRI showed in the right breast 5 x 3 x 4.5  cm mass abutting the chest wall. About 7 cm away from this there was another mass in the upper inner quadrant that measured 1.5 x 1.7 x 1.5 cm. On the contralateral breast, that is the left breast a 2 cm area suspicious  enhancement was noted also this up to date has not been biopsied and arrangements are being made for the biopsy to be performed. In this side there were no suspicious lymph nodes. The prognostic panel is pending. Patient is otherwise without any complaints. "  METASTATIC DISEASE: From the earlier summary note:  [name] noted some strange feelings around her umbilicus 16/10/9602. This felt like an area of numbness. Over the next 2 days she noted some leg weakness and difficulty walking, so she presented to the ED 04/12/2014. MRI of the thoraco-lumbar spine was obtained 04/12/2014 showing multiple bone lesions and compression fracture at T8 with retropulsion and cord compression. On 04/13/2014 she underwent Laminectomy T8 decompression of spinal cord posterior fixation from T6-T10 with pedicle screws and rods posterior arthrodesis with allograft. The pathology from this procedure (SZA [phone]) showed metastatic adenocarcinoma which was estrogen receptor 69% positive, with moderate staining intensity, progesterone receptor negative. HER-2 could not be obtained.  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 There were nonspecific L temporal bone changes and also possible involvement of the clivus and calvarium, but no other parenchymal brain lesions. Further staging studies included a bone scan, which failed to show the lytic lesion seen on other scans, and CTs of the chest, abdomen and pelvis on 06/08/2011, which showed very small right lung and left liver lesions which will require follow-up  Her subsequent history is as detailed below   PAST MEDICAL HISTORY: Past Medical History:  Diagnosis Date  . Breast cancer ([place]) 09/2011   invasive ductal carcinoma metastatic ca in 3/14 lymph nodes  . History of chemotherapy 06/27/11 -09/04/11   neoadjuvant  . Hx of radiation therapy 11/08/11 -12/26/11   right  chest wall/supraclav fossa, right scar  . S/P radiation therapy 05/08/14   SRS brain    PAST SURGICAL HISTORY: Past Surgical History:  Procedure Laterality Date  . brain surgery-LITT Left 11/20/14   LITT procedure for Lt brain met.  Marland Kitchen BREAST BIOPSY     Left  . CHOLECYSTECTOMY N/A 03/01/2014   Procedure: LAPAROSCOPIC CHOLECYSTECTOMY;  Surgeon: Leighton Ruff, MD;  Location: [facility];  Service: General;  Laterality: N/A;  . LAMINECTOMY N/A 04/13/2014   Procedure: THORACIC LAMINECTOMY WITH FIXATION THORACIC SIX-THORACIC TEN FUSION;  Surgeon: Kristeen Miss, MD;  Location: [facility];  Service: Neurosurgery;  Laterality: N/A;  . MODIFIED MASTECTOMY  10/03/2011   Procedure: MODIFIED MASTECTOMY;  Surgeon: Haywood Lasso, MD;  Location: [facility];  Service: General;  Laterality: Right;  . PORT-A-CATH REMOVAL  01/30/2012   Procedure: MINOR REMOVAL PORT-A-CATH;  Surgeon: Haywood Lasso, MD;  Location: [facility];  Service: General;  Laterality: Left;  . PORTACATH PLACEMENT  06/20/2011   Procedure: INSERTION PORT-A-CATH;  Surgeon: Haywood Lasso, MD;  Location: [facility];  Service: General;  Laterality: N/A;  . ROBOTIC ASSISTED TOTAL HYSTERECTOMY WITH BILATERAL SALPINGO OOPHERECTOMY Bilateral 12/23/2012   Procedure: ROBOTIC ASSISTED TOTAL HYSTERECTOMY WITH BILATERAL SALPINGO OOPHORECTOMY;  Surgeon: Marvene Staff, MD;  Location: [facility];  Service: Gynecology;  Laterality: Bilateral;  . WISDOM TOOTH EXTRACTION      FAMILY HISTORY  Family History  Problem Relation Age of Onset  . Breast cancer Maternal Aunt 31  . Pancreatic cancer Maternal Grandfather   . Pancreatic cancer Maternal Aunt        died in her 67s  . Leukemia Maternal Aunt        died in her 36s  . Ovarian cancer Cousin 59       maternal cousin   The patient's father is alive, 44 years old. The patient's mother died from complications of diabetes at the age of 22. The patient had no brothers, 4  sisters. One sister has died from congestive heart failure. The patient's mother is one of 5 sisters. One of them was diagnosed with breast cancer the age of 82. Also a cousin on the maternal side it was diagnosed with ovarian cancer. This was approximately age 52. There is also colon cancer in the family. The patient has had extensive genetic testing summarized below. She is however BRCA negative  GYNECOLOGIC HISTORY:  Patient's last menstrual period was 07/05/2011 (exact date). Menarche age 55, she is GX P0. She underwent total hysterectomy with bilateral salpingo-oophorectomy December 2014.  SOCIAL HISTORY:  Zeynab works at [employer] for [employer]. She mostly deals with government documents. She is single, lives by herself, with no pets. She attends [employer] locally.    ADVANCED DIRECTIVES: Not in place. The patient has a documents and intends to name her sister Midori Dado as healthcare power of attorney. Joelene Millin lives in [place] and can be reached at [place]: Social History   Tobacco Use  . Smoking status: Never Smoker  . Smokeless tobacco: Never Used  Substance Use Topics  . Alcohol use: No  . Drug use: No     Colonoscopy:  PAP:  Bone density:  Lipid panel:  Allergies  Allergen Reactions  . Allegra [Fexofenadine] Hives    Abdomen only  . Cat Hair Extract Other (See Comments)  . Shellfish Allergy Hives    Abdomen only  . Glucosamine Rash    Current Outpatient Medications  Medication Sig Dispense Refill  . Cholecalciferol (VITAMIN D3) 2000 units TABS Take by mouth daily.    Marland Kitchen gabapentin (NEURONTIN) 100 MG capsule TAKE 1 TO 3 CAPSULES BY MOUTH AT BEDTIME FOR NEUROPATHY 90 capsule 0  . LORazepam (ATIVAN) 0.5 MG tablet 1 tablet po 30 minutes prior to radiation or MRI 30 tablet 0  . Multiple Vitamin (MULTIVITAMIN) tablet Take 1 tablet by mouth daily.    . ondansetron (ZOFRAN) 8 MG tablet Take 1 tablet (8 mg total) by mouth every 8 (eight)  hours as needed for nausea or vomiting. 30 tablet 4  . polyethylene glycol (MIRALAX / GLYCOLAX) packet Take 17 g by mouth daily. 14 each 0  . prochlorperazine (COMPAZINE) 10 MG tablet Take 1 tablet (10 mg total) by mouth every 6 (six) hours as needed (Nausea or vomiting). 30 tablet 1  . traMADol (ULTRAM) 50 MG tablet Take 1 tablet (50 mg total) by mouth every 6 (six) hours as needed. 60 tablet 3   No current facility-administered medications for this visit.     OBJECTIVE: Young African-American woman who appears stated age  3:   01/29/17 1044  BP: 115/65  Pulse: 94  Resp: 18  Temp: (!) 97.3 F (36.3 C)  SpO2: 97%     Body mass index is 16.91 kg/m.    ECOG FS:2 - Symptomatic, <50% confined to bed   Sclerae unicteric, pupils round  and equal, EOMs intact Oropharynx clear and moist No cervical or supraclavicular adenopathy Lungs no rales or rhonchi Heart regular rate and rhythm Abd soft, nontender, positive bowel sounds MSK no focal spinal tenderness, no upper extremity lymphedema Neuro: Unchanged from prior,, well oriented, appropriate affect Breasts: Deferred  LAB RESULTS:  CMP     Component Value Date/Time   NA 138 01/29/2017 0905   NA 141 01/08/2017 0842   K 4.2 01/29/2017 0905   K 4.5 01/08/2017 0842   CL 107 01/29/2017 0905   CL 102 01/16/2012 0804   CO2 22 01/29/2017 0905   CO2 25 01/08/2017 0842   GLUCOSE 120 01/29/2017 0905   GLUCOSE 109 01/08/2017 0842   GLUCOSE 92 01/16/2012 0804   BUN 23 01/29/2017 0905   BUN 26.9 (H) 01/08/2017 0842   CREATININE 1.31 (H) 01/29/2017 0905   CREATININE 1.2 (H) 01/08/2017 0842   CALCIUM 8.8 01/29/2017 0905   CALCIUM 9.4 01/08/2017 0842   PROT 7.5 01/29/2017 0905   PROT 7.8 01/08/2017 0842   ALBUMIN 3.6 01/29/2017 0905   ALBUMIN 3.5 01/08/2017 0842   AST 66 (H) 01/29/2017 0905   AST 39 (H) 01/08/2017 0842   ALT 57 (H) 01/29/2017 0905   ALT 23 01/08/2017 0842   ALKPHOS 198 (H) 01/29/2017 0905   ALKPHOS 185 (H)  01/08/2017 0842   BILITOT 0.5 01/29/2017 0905   BILITOT 0.33 01/08/2017 0842   GFRNONAA 48 (L) 01/29/2017 0905   GFRAA 55 (L) 01/29/2017 0905    I No results found for: SPEP  Lab Results  Component Value Date   WBC 3.4 (L) 01/29/2017   NEUTROABS 1.8 01/29/2017   HGB 8.8 (L) 01/29/2017   HCT 26.5 (L) 01/29/2017   MCV 87.5 01/29/2017   PLT 17 (L) 01/29/2017      Chemistry      Component Value Date/Time   NA 138 01/29/2017 0905   NA 141 01/08/2017 0842   K 4.2 01/29/2017 0905   K 4.5 01/08/2017 0842   CL 107 01/29/2017 0905   CL 102 01/16/2012 0804   CO2 22 01/29/2017 0905   CO2 25 01/08/2017 0842   BUN 23 01/29/2017 0905   BUN 26.9 (H) 01/08/2017 0842   CREATININE 1.31 (H) 01/29/2017 0905   CREATININE 1.2 (H) 01/08/2017 0842      Component Value Date/Time   CALCIUM 8.8 01/29/2017 0905   CALCIUM 9.4 01/08/2017 0842   ALKPHOS 198 (H) 01/29/2017 0905   ALKPHOS 185 (H) 01/08/2017 0842   AST 66 (H) 01/29/2017 0905   AST 39 (H) 01/08/2017 0842   ALT 57 (H) 01/29/2017 0905   ALT 23 01/08/2017 0842   BILITOT 0.5 01/29/2017 0905   BILITOT 0.33 01/08/2017 0842       Lab Results  Component Value Date   LABCA2 24 05/31/2011    No components found for: [id number]  No results for input(s): INR in the last 168 hours.  Urinalysis    Component Value Date/Time   COLORURINE STRAW (A) 09/05/2016 2000    STUDIES: Mr Jeri Cos FA Contrast  Result Date: 01/25/2017 CLINICAL DATA:  47 year old female with metastatic breast cancer. Completed stereotactic radiosurgery to a left parietal lesion on 04/29/2014 to a dose of 20 gray, followed by auto-litt procedure (laser ablation) November 2016. Small right parietal lobe metastasis discovered on MRI in November 2017 status post SRS on 12/1415 to a dose of 20 Gy. Osseous metastatic disease. EXAM: MRI HEAD WITHOUT AND WITH CONTRAST TECHNIQUE: Multiplanar,  multiecho pulse sequences of the brain and surrounding structures were obtained  without and with intravenous contrast. CONTRAST:  71m MULTIHANCE GADOBENATE DIMEGLUMINE 529 MG/ML IV SOLN COMPARISON:  10/19/2016 and earlier. FINDINGS: Brain: Stable mixed T1 signal, dark T2 signal left periatrial treated lesion (series 5, image 17 and series 10, image 100) with only faint adjacent FLAIR hyperintensity and no regional mass effect. Enlargement of the treated right inferior parietal metastasis which has increased from 4 millimeters to 12-13 millimeters largest dimension as seen on series 10, image 88. Furthermore, confluent surrounding T2 and FLAIR hyperintensity has developed since October with mild regional mass effect suggesting vasogenic edema. See series 10, image 88 and series 7, image 29. Furthermore, the lesion now demonstrates confluent hemosiderin on T2* imaging (series 6, image 16) which is new since July 2018, and only punctate blood products were evident in October. Subsequently, the enhancing portion of the lesion is almost completely dark on T2 weighted imaging (series 5, image 16). Furthermore, the pituitary and pituitary infundibulum have become abnormal, and are now thickened and lobulated with hyperenhancement. Precontrast T1 weighted images suggest the presence of a T1 hypointense enhancing metastasis occupying the posterior aspect of the sella and compressing the remainder of the gland the hypothalamus remains normal gland anteriorly (series 2, image 19). There is associated abnormal hypothalamic T2 and FLAIR hyperintensity compatible with edema (series 7, image 25). The cavernous sinus appears to remain normal. Small new 5 millimeter enhancing metastasis in the left inferior occipital lobe has developed since October with mild surrounding edema but no significant mass effect. See series 10, image 53. No other abnormal intracranial enhancement identified. No dural thickening. No ventriculomegaly or midline shift. No restricted diffusion or evidence of acute infarction. No acute  intracranial hemorrhage identified. Stable gray and white matter signal elsewhere. Negative cervicomedullary junction. Vascular: Major intracranial vascular flow voids are stable. Skull and upper cervical spine: Diffusely abnormal bone marrow signal in keeping with progressive osseous metastatic disease since July 2017. There is a chronic left temporal bone bone lesion near the semicircular canals (series 5, image 8) which has been stable since 2,016 and is favored to be benign. No definite abnormality of the visible cervical spinal cord. Sinuses/Orbits: Normal orbits soft tissues. Negative paranasal sinuses. Other: Visible internal auditory structures appear normal. Mastoids remain clear. No scalp or face soft tissue abnormality identified. IMPRESSION: 1. Progression of disease since 10/19/2016. 2. 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 Associated hypothalamic edema without significant regional mass effect. - 5 millimeter left inferior occipital lobe. Mild edema with no mass effect. 3. Enlargement of the treated right inferior parietal lobe metastasis from 4 mm now to 12-13 mm with new confluent vasogenic edema and mild regional mass effect. However, the appearance is complicated by evidence of interval hemorrhage within the lesion. Still in light of #2, the appearance is suspicious for true progression. 4. Progressive osseous metastatic disease since July 2018. It is possible that the pituitary involvement is via contiguous spread from the skull base. 5. The treated left periatrial lesion remains stable. Electronically Signed   By: HGenevie AnnM.D.   On: 01/25/2017 11:18     ASSESSMENT: 47y.o. BRCA negative [place] woman status post right breast upper outer quadrant and right axillary lymph node biopsy 05/18/2011, both positive for an invasive ductal carcinoma, high-grade, clinicallyT2 N1-2 or stage IIB/IIIA,  estrogen receptor 100% positive, progesterone receptor 87% positive, with  an MIB-1 of 14% and no HER-2 amplification ([id number] [id number].  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   (a) VUS were also found in [place] and BARD1  (2) additional right breast biopsy upper inner quadrant 06/08/2011 showed only a fibroadenoma, and central left breast biopsy for another suspicious lesion showed only fibrocystic changes ([id number] and [id number]).   (3)Treated neoadjuvantly with cyclophosphamide and docetaxel x4 completed 08/29/2011.  (4) status post right modified radical mastectomy 10/03/2011 showing a residual pT1c pN1a (3/18 lymph nodes positive) invasive ductal carcinoma, grade 1,estrogen receptor 100% positive, progesterone receptor negative, with no HER-2 amplification ([id number])  (5) adjuvant radiation to the right chest wall, right supraclavicular fossa and right scar completed 12/26/2011  (6) tamoxifen started January 2014-- discontinued April 2016 with evidence of metastatic disease  (7) status post total hysterectomy with bilateral salpingo-oophorectomy 12/23/2012 with benign pathology ([id number])  METASTATIC DISEASE 04/13/2014 (8) presented with T8 cord compression and underwent laminectomy 04/13/2014 with T8 decompression of spinal cord, posterior fixation from T6-T10 with pedicle screws and rods, posterior arthrodesis with allograft. 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   (9) additional staging studies showed (a) multiple bone lesions, mostly lytic (so not well seen on bone scan) (b) single 0.7 cm brain metastasis at L centrum semiovale 04/29/2014  (c) RUL (36m) and RLL (263m pulmonary nodules  (d) small left liver lesions, possibly mew  (10) RADIATION IN METASTATIC SETTING:  (a) SRS to Lt Post Centrum Semiovale to 20 Gy given 04/29/2014. ExacTrac Snap verification was performed for each couch angle.    (b) radiation to the T-spine completed 05/08/2014.  (c) [facility]  (d) SRS to 0.4 cm left parietal lesion 12/23/2015  (11) started fulvestrant 05/18/2014 and Palbociclib 06/01/2014  (a) Palbociclib dose decreased to 100 mg daily, 21/7 as of 08/05/2014  (b) palbociclib dose decreased to 75 mg daily, 21/7, starting May 2017  (c) palbociclib dose decreased to 75 mg every other day beginning 07/26/2015    (d) palbociclib held for month of October because of persistent low counts, resumed November  (e) fulvestrant discontinued after 11/02/2016 dose: Anastrozole started 08/01/2016    (12) started zolendronate 05/18/2014, repeated every 12 weeks  (a) July zolendronate dose held because of dental issues, resumed 10/18/2015  (b) switched to denosumab as of 01/20/2016 with concern of bony progression on zolendronate  (c) switching back to zolendronate as of April 2018 at patient's request  (d) switched to denosumab/Xgeva because of hypercalcemia, first dose 09/13/2016  (13) most recent staging studies:  (a) brain MRI 07/13/2016 shows stable to smaller treated lesions.  (b) bone scan 04/06/2016 shows no new lesions  (c) chest CT scan 04/06/2016 shows small bilateral pulmonary nodules with no evidence of disease progression  (d) MRI of the thoracolumbar spine shows diffuse bony metastases particularly involving T8, T11 and T12, but without epidural disease. There were subcentimeter pulmonary nodules and indeterminate liver lesions. CT scan of the left hip 09/06/2016 showed multiple bony metastases but no fracture  (e) PET scan 12/08/2016 and liver MRI 12/04/2016 shows several liver lesions at least 1 of which is hypermetabolic (1.4 cm, SUV max 3.9)  (f) MRI of the brain on 01/25/2017 showed progression  (14) anastrozole resumed 10/20/2016, discontinued 12/13/2016  (a) everolimus started 09/25/2016, discontinued 12/06/2016  (15) palliative  radiation to the right hip completed 09/26/2016  (16) started Doxil 12/25/2016, to be repeated every 28 days (after 3-4 cycles of Doxil  will restage with an MRI of the liver and likely repeat PET scan, MRI brain repeat due mid January 2019)  (a) baseline echocardiogram on 12/22/16 shows LVEF of 55-60%  (17) pancytopenia, secondary to chemotherapy and the cancer itself as well as renal insufficiency  (a) aranesp started 01/29/2017, to be repeated every 2 weeks  (18) severe protein calorie malnutrition  (19) further irradiation to the new brain spots pending  (a) referral to endocrinology in process  (b) referral to palliative care for consideration of goals of care also in process    PLAN: Kortnie is now nearly 3 years out from definitive diagnosis of metastatic breast cancer.  Unfortunately she has further progression in the brain.  The plan at this point is for further stereotactic radiosurgery.  Ruchel is aware that that there can be progressive cognitive dysfunction with more radiation treatments.  Today we reviewed advanced directives.  She has named her sister Carolyn [name] as her healthcare power of attorney, but she thinks that document may have outdated.  I gave her a new copy of the healthcare power of attorney and that includes a living will for her to complete.  In meeting with palliative would be helpful in that regard as well  We are starting Aranesp today.  Hopefully she will become less transfusion dependent over the next 2 months.  She has very low platelets but no bleeding at present; however there was minimal bleeding in a prior brain lesion.  It may be prudent to transfuse at this point and try to keep the platelet count at least above 20,000.    She is keeping herself well hydrated and making a real effort at gaining weight.  She knows to call for any other problems that may develop before her next visit here.  Magrinat, Virgie Dad, MD  01/29/17 11:08 AM Medical Oncology and  Hematology [facility] [address] [place], [place] [address] Tel. [phone]    Fax. [phone]  This document serves as a record of services personally performed by Lurline Del, MD. It was created on his behalf by Sheron Nightingale, a trained medical scribe. The creation of this record is based on the scribe's personal observations and the provider's statements to them.   I have reviewed the above documentation for accuracy and completeness, and I agree with the above.

## 2017-01-26 LAB — TYPE AND SCREEN
ABO/RH(D): B POS
ANTIBODY SCREEN: NEGATIVE
Unit division: 0
Unit division: 0

## 2017-01-26 LAB — BPAM RBC
BLOOD PRODUCT EXPIRATION DATE: 201902012359
Blood Product Expiration Date: 201902012359
ISSUE DATE / TIME: 201901151400
ISSUE DATE / TIME: 201901151400
UNIT TYPE AND RH: 7300
Unit Type and Rh: 7300

## 2017-01-29 ENCOUNTER — Inpatient Hospital Stay: Payer: BC Managed Care – PPO

## 2017-01-29 ENCOUNTER — Ambulatory Visit
Admission: RE | Admit: 2017-01-29 | Discharge: 2017-01-29 | Disposition: A | Discharge status: Home / Self Care | Payer: BC Managed Care – PPO | Source: Ambulatory Visit | Attending: Radiation Oncology | Admitting: Radiation Oncology

## 2017-01-29 ENCOUNTER — Other Ambulatory Visit: Payer: Self-pay

## 2017-01-29 ENCOUNTER — Other Ambulatory Visit: Payer: BC Managed Care – PPO

## 2017-01-29 ENCOUNTER — Telehealth: Payer: Self-pay | Admitting: Oncology

## 2017-01-29 ENCOUNTER — Inpatient Hospital Stay (HOSPITAL_BASED_OUTPATIENT_CLINIC_OR_DEPARTMENT_OTHER): Payer: BC Managed Care – PPO | Admitting: Oncology

## 2017-01-29 ENCOUNTER — Encounter: Payer: Self-pay | Admitting: Radiation Oncology

## 2017-01-29 ENCOUNTER — Ambulatory Visit (HOSPITAL_BASED_OUTPATIENT_CLINIC_OR_DEPARTMENT_OTHER)
Admission: RE | Admit: 2017-01-29 | Discharge: 2017-01-29 | Disposition: A | Discharge status: Home / Self Care | Payer: BC Managed Care – PPO | Source: Ambulatory Visit | Attending: Radiation Oncology | Admitting: Radiation Oncology

## 2017-01-29 ENCOUNTER — Ambulatory Visit: Payer: BC Managed Care – PPO

## 2017-01-29 VITALS — BP 111/55 | HR 96 | Temp 99.7°F | Resp 17

## 2017-01-29 VITALS — BP 115/65 | HR 94 | Temp 97.3°F | Resp 18 | Ht 68.0 in | Wt 111.2 lb

## 2017-01-29 VITALS — BP 115/69 | HR 89 | Temp 98.1°F | Resp 20 | Wt 111.1 lb

## 2017-01-29 DIAGNOSIS — Z8041 Family history of malignant neoplasm of ovary: Secondary | ICD-10-CM | POA: Diagnosis not present

## 2017-01-29 DIAGNOSIS — Z800 Family history of malignant neoplasm of digestive organs: Secondary | ICD-10-CM | POA: Insufficient documentation

## 2017-01-29 DIAGNOSIS — C50411 Malignant neoplasm of upper-outer quadrant of right female breast: Secondary | ICD-10-CM

## 2017-01-29 DIAGNOSIS — C7951 Secondary malignant neoplasm of bone: Secondary | ICD-10-CM

## 2017-01-29 DIAGNOSIS — Z170 Estrogen receptor positive status [ER+]: Secondary | ICD-10-CM

## 2017-01-29 DIAGNOSIS — G959 Disease of spinal cord, unspecified: Secondary | ICD-10-CM

## 2017-01-29 DIAGNOSIS — Z806 Family history of leukemia: Secondary | ICD-10-CM | POA: Insufficient documentation

## 2017-01-29 DIAGNOSIS — R110 Nausea: Secondary | ICD-10-CM

## 2017-01-29 DIAGNOSIS — C7931 Secondary malignant neoplasm of brain: Secondary | ICD-10-CM

## 2017-01-29 DIAGNOSIS — Z79899 Other long term (current) drug therapy: Secondary | ICD-10-CM | POA: Diagnosis not present

## 2017-01-29 DIAGNOSIS — Z510 Encounter for antineoplastic radiation therapy: Secondary | ICD-10-CM | POA: Diagnosis not present

## 2017-01-29 DIAGNOSIS — Z803 Family history of malignant neoplasm of breast: Secondary | ICD-10-CM | POA: Diagnosis not present

## 2017-01-29 DIAGNOSIS — C7949 Secondary malignant neoplasm of other parts of nervous system: Secondary | ICD-10-CM

## 2017-01-29 DIAGNOSIS — Z9221 Personal history of antineoplastic chemotherapy: Secondary | ICD-10-CM | POA: Insufficient documentation

## 2017-01-29 DIAGNOSIS — E440 Moderate protein-calorie malnutrition: Secondary | ICD-10-CM

## 2017-01-29 DIAGNOSIS — C50919 Malignant neoplasm of unspecified site of unspecified female breast: Secondary | ICD-10-CM

## 2017-01-29 DIAGNOSIS — Z515 Encounter for palliative care: Secondary | ICD-10-CM | POA: Diagnosis not present

## 2017-01-29 DIAGNOSIS — M2550 Pain in unspecified joint: Secondary | ICD-10-CM | POA: Insufficient documentation

## 2017-01-29 DIAGNOSIS — G893 Neoplasm related pain (acute) (chronic): Secondary | ICD-10-CM

## 2017-01-29 DIAGNOSIS — Z9011 Acquired absence of right breast and nipple: Secondary | ICD-10-CM | POA: Diagnosis not present

## 2017-01-29 DIAGNOSIS — Z9049 Acquired absence of other specified parts of digestive tract: Secondary | ICD-10-CM | POA: Diagnosis not present

## 2017-01-29 DIAGNOSIS — Z7189 Other specified counseling: Secondary | ICD-10-CM

## 2017-01-29 DIAGNOSIS — R530 Neoplastic (malignant) related fatigue: Secondary | ICD-10-CM | POA: Diagnosis not present

## 2017-01-29 DIAGNOSIS — E43 Unspecified severe protein-calorie malnutrition: Secondary | ICD-10-CM | POA: Diagnosis not present

## 2017-01-29 DIAGNOSIS — Z90710 Acquired absence of both cervix and uterus: Secondary | ICD-10-CM | POA: Diagnosis not present

## 2017-01-29 DIAGNOSIS — Z923 Personal history of irradiation: Secondary | ICD-10-CM | POA: Insufficient documentation

## 2017-01-29 DIAGNOSIS — F40240 Claustrophobia: Secondary | ICD-10-CM | POA: Insufficient documentation

## 2017-01-29 DIAGNOSIS — C50911 Malignant neoplasm of unspecified site of right female breast: Secondary | ICD-10-CM

## 2017-01-29 DIAGNOSIS — D61818 Other pancytopenia: Secondary | ICD-10-CM

## 2017-01-29 DIAGNOSIS — N179 Acute kidney failure, unspecified: Secondary | ICD-10-CM

## 2017-01-29 DIAGNOSIS — Z90722 Acquired absence of ovaries, bilateral: Secondary | ICD-10-CM | POA: Diagnosis not present

## 2017-01-29 LAB — CBC WITH DIFFERENTIAL/PLATELET
BASOS ABS: 0 10*3/uL (ref 0.0–0.1)
BASOS PCT: 1 %
EOS ABS: 0.1 10*3/uL (ref 0.0–0.5)
Eosinophils Relative: 2 %
HEMATOCRIT: 26.5 % — AB (ref 34.8–46.6)
HEMOGLOBIN: 8.8 g/dL — AB (ref 11.6–15.9)
Lymphocytes Relative: 39 %
Lymphs Abs: 1.3 10*3/uL (ref 0.9–3.3)
MCH: 29 pg (ref 25.1–34.0)
MCHC: 33.2 g/dL (ref 31.5–36.0)
MCV: 87.5 fL (ref 79.5–101.0)
Monocytes Absolute: 0.1 10*3/uL (ref 0.1–0.9)
Monocytes Relative: 4 %
NEUTROS ABS: 1.8 10*3/uL (ref 1.5–6.5)
NEUTROS PCT: 54 %
Platelets: 17 10*3/uL — ABNORMAL LOW (ref 145–400)
RBC: 3.03 MIL/uL — AB (ref 3.70–5.45)
RDW: 15.4 % (ref 11.2–16.1)
WBC: 3.4 10*3/uL — AB (ref 3.9–10.3)

## 2017-01-29 LAB — COMPREHENSIVE METABOLIC PANEL
ALBUMIN: 3.6 g/dL (ref 3.5–5.0)
ALK PHOS: 198 U/L — AB (ref 40–150)
ALT: 57 U/L — ABNORMAL HIGH (ref 0–55)
AST: 66 U/L — AB (ref 5–34)
Anion gap: 9 (ref 3–11)
BILIRUBIN TOTAL: 0.5 mg/dL (ref 0.2–1.2)
BUN: 23 mg/dL (ref 7–26)
CALCIUM: 8.8 mg/dL (ref 8.4–10.4)
CO2: 22 mmol/L (ref 22–29)
Chloride: 107 mmol/L (ref 98–109)
Creatinine, Ser: 1.31 mg/dL — ABNORMAL HIGH (ref 0.60–1.10)
GFR calc Af Amer: 55 mL/min — ABNORMAL LOW (ref >60)
GFR calc non Af Amer: 48 mL/min — ABNORMAL LOW (ref >60)
GLUCOSE: 120 mg/dL (ref 70–140)
Potassium: 4.2 mmol/L (ref 3.3–4.7)
SODIUM: 138 mmol/L (ref 136–145)
TOTAL PROTEIN: 7.5 g/dL (ref 6.4–8.3)

## 2017-01-29 LAB — SAMPLE TO BLOOD BANK

## 2017-01-29 MED ORDER — ACETAMINOPHEN 500 MG PO TABS
ORAL_TABLET | ORAL | Status: AC
  Filled 2017-01-29: qty 2

## 2017-01-29 MED ORDER — SODIUM CHLORIDE 0.9 % IV SOLN
250.0000 mL | Freq: Once | INTRAVENOUS | Status: AC
  Administered 2017-01-29: 250 mL via INTRAVENOUS

## 2017-01-29 MED ORDER — DIPHENHYDRAMINE HCL 25 MG PO CAPS
ORAL_CAPSULE | ORAL | Status: AC
  Filled 2017-01-29: qty 1

## 2017-01-29 MED ORDER — DIPHENHYDRAMINE HCL 25 MG PO CAPS
25.0000 mg | ORAL_CAPSULE | Freq: Once | ORAL | Status: AC
  Administered 2017-01-29: 25 mg via ORAL

## 2017-01-29 MED ORDER — ACETAMINOPHEN 325 MG PO TABS
ORAL_TABLET | ORAL | Status: AC
  Filled 2017-01-29: qty 2

## 2017-01-29 MED ORDER — ACETAMINOPHEN 325 MG PO TABS
650.0000 mg | ORAL_TABLET | Freq: Once | ORAL | Status: AC
  Administered 2017-01-29: 650 mg via ORAL

## 2017-01-29 MED ORDER — ONDANSETRON HCL 8 MG PO TABS: 8.0000 mg | ORAL_TABLET | Freq: Three times a day (TID) | ORAL | 4 refills | Status: AC | PRN

## 2017-01-29 MED ORDER — DARBEPOETIN ALFA 100 MCG/0.5ML IJ SOSY
100.0000 ug | PREFILLED_SYRINGE | Freq: Once | INTRAMUSCULAR | Status: AC
  Administered 2017-01-29: 100 ug via SUBCUTANEOUS
  Filled 2017-01-29: qty 0.5

## 2017-01-29 MED ORDER — LORAZEPAM 0.5 MG PO TABS: ORAL_TABLET | ORAL | 0 refills | Status: AC

## 2017-01-29 MED ORDER — SODIUM CHLORIDE 0.9% FLUSH
3.0000 mL | INTRAVENOUS | Status: DC | PRN
  Filled 2017-01-29: qty 10

## 2017-01-29 MED FILL — LORazepam 0.5 MG TABS: 0.5 | 30 days supply | Qty: 30 | Fill #0

## 2017-01-29 MED FILL — ONDANSETRON HCL 8 MG TAB: 8 | 10 days supply | Qty: 30 | Fill #0

## 2017-01-29 NOTE — Patient Instructions (Signed)
Platelet Transfusion A platelet transfusion is a procedure in which you receive donated platelets through an IV tube. Platelets are tiny pieces of blood cells. When a blood vessel is damaged, platelets collect in the damaged area to help form a blood clot. This begins the healing process. If your platelet count gets too low, your blood may have trouble clotting. You may need a platelet transfusion if you have a condition that causes a low number of platelets (thrombocytopenia). A platelet transfusion may be used to stop or prevent bleeding. Tell a health care provider about:  Any allergies you have.  All medicines you are taking, including vitamins, herbs, eye drops, creams, and over-the-counter medicines.  Any problems you or family members have had with anesthetic medicines.  Any blood disorders you have.  Any surgeries you have had.  Any medical conditions you have.  Any reactions you have had during a previous transfusion. What are the risks? Generally, this is a safe procedure. However, problems may occur, including:  Fever with or without chills. The fever usually occurs within the first 4 hours of the transfusion and returns to normal within 48 hours.  Allergic reaction. The reaction is most commonly caused by antibodies your body creates against substances in the transfusion. Signs of an allergic reaction may include itching, hives, difficulty breathing, shock, or low blood pressure.  Sudden (acute) or delayed hemolytic reaction. This rare reaction can occur during the transfusion and up to 28 days after the transfusion. The reaction usually occurs when your body's defense system (immune system) attacks the new platelets. Signs of a hemolytic reaction may include fever, headache, difficulty breathing, low blood pressure, a rapid heartbeat, or pain in your back, abdomen, chest, or IV site.  Transfusion-related acute lung injury (TRALI). TRALI can occur within hours of a transfusion,  or several days later. This is a rare reaction that causes lung damage. The cause is not known.  Infection. Signs of this rare complication may include fever, chills, vomiting, a rapid heartbeat, or low blood pressure.  What happens before the procedure?  You may have a blood test to determine your blood type. This is necessary to find out what kind ofplatelets best matches your platelets.  If you have had an allergic reaction to a transfusion in the past, you may be given medicine to help prevent a reaction. Take this medicine only as directed by your health care provider.  Your temperature, blood pressure, and pulse will be monitored before the transfusion. What happens during the procedure?  An IV will be started in your hand or arm.  The transfusion will be attached to your IV tubing. The bag of donated platelets will be attached to your IV tube andgiven into your vein.  Your temperature, blood pressure, and pulse will be monitored regularly during the transfusion. This monitoring is done to help detect early signs of a transfusion reaction.  If you have any signs or symptoms of a reaction, your transfusion will be stopped and you may be given medicine.  When your transfusion is complete, your IV will be removed.  Pressure may be applied to the IV site for a few minutes.  A bandage (dressing) will be applied. The procedure may vary among health care providers and hospitals. What happens after the procedure?  Your blood pressure, temperature, and pulse will be monitored regularly. This information is not intended to replace advice given to you by your health care provider. Make sure you discuss any questions you have   with your health care provider. Document Released: 10/23/2006 Document Revised: 06/03/2015 Document Reviewed: 11/05/2013 Elsevier Interactive Patient Education  2018 [employer] Inc.  

## 2017-01-29 NOTE — Telephone Encounter (Signed)
Patient declined AVs. Patient will receive update in MyChart.

## 2017-01-29 NOTE — Addendum Note (Signed)
Encounter addended by: Hayden Pedro, PA-C on: 01/29/2017 10:41 AM  Actions taken: Order list changed, Diagnosis association updated, Follow-up modified

## 2017-01-29 NOTE — Progress Notes (Signed)
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 The L-5 spine was treated to 30 Gy in 10 fraction at 3 Gy per fraction. 2. The Left pelvis was treated to 30 Gy in 10 fraction at 3 Gy per fraction.  12/23/2015 SRS Treatment:  PTV2 Right parietal was treated to 20 Gy in 1 fraction.  04/29/2014 SRS Treatment:  PTV1 Left parietal lesion to a dose of 20 gray in 1 fraction  04/27/14-05/08/14:  Palliative radiotherapy to the thoracic spine 30 Gy in 10 fractions  11/08/2011-12/26/2011: Right chest wall / 50.4 Gray @ 1.8 Pearline Cables per fraction x 28 fractions Right Supraclavicular fossa / 32 Gray '@1' .8 Gray per fraction x 25 fractions Right scar / 10 Gray at [employer] per fraction x 5 fractions  Narrative:  The patient returns today for routine follow-up. This is a young patient who was diagnosed with metastatic right breast cancer to the brain who underwent stereotactic radiosurgery to the brain lesion in 2016. She continued to have edema and subsequently was referred to Dr. Salomon Fick at [facility]. She underwent LITT on 11/20/14.  She proceeded with another course of SRS in December 2017. She does have additional disease in the thoracic spine but has not required radiotherapy to this site since 2016 . She underwent treatment to the L5 vertebral body and left pelvis/hip which she completed in September 2018. She had an MRI of the brain on 10/19/16 which revealed stability of her previously treated disease a no new brain disease. She comes today however to review her most recent MRI scan which was  on 01/25/2017 which revealed inflammatory change from her previously treated right parietal lesion that appears to be consistent with microhemorrhages as well as a 4 mm area in the left occipital lobe, and a 12 mm area involving the infundibulum and pituitary gland felt to be arising from disease in the clivus.  On review of systems, the patient reports that she is doing ok.  Her current regimen of Doxil has been more fatiguing, and has caused her more nausea han her previous regimens.  She denies any chest pain, shortness of breath, cough, fevers, chills, night sweats, unintended weight changes. She denies any bowel or bladder disturbances, and denies abdominal pain. She denies any new musculoskeletal or joint aches or pains, new skin lesions or concerns.  She continues to have joint aches and pains in her low back, and pelvis, this is unchanged, and she continues to use a cane when walking.  A complete review of systems is obtained and is otherwise negative.   Past Medical History:  Past Medical History:  Diagnosis Date  . Breast cancer ([place]) 09/2011   invasive ductal carcinoma metastatic ca in 3/14 lymph nodes  . History of chemotherapy 06/27/11 -09/04/11   neoadjuvant  . Hx of radiation therapy 11/08/11 -12/26/11   right chest wall/supraclav fossa, right scar  . S/P radiation therapy 05/08/14   SRS brain    Past Surgical History: Past Surgical History:  Procedure Laterality Date  . brain surgery-LITT Left 11/20/14   LITT procedure for  Lt brain met.  Marland Kitchen BREAST BIOPSY     Left  . CHOLECYSTECTOMY N/A 03/01/2014   Procedure: LAPAROSCOPIC CHOLECYSTECTOMY;  Surgeon: Leighton Ruff, MD;  Location: [facility];  Service: General;  Laterality: N/A;  . LAMINECTOMY N/A 04/13/2014   Procedure: THORACIC LAMINECTOMY WITH FIXATION THORACIC SIX-THORACIC TEN FUSION;  Surgeon: Kristeen Miss, MD;  Location: [facility];  Service: Neurosurgery;  Laterality: N/A;  . MODIFIED MASTECTOMY  10/03/2011   Procedure: MODIFIED  MASTECTOMY;  Surgeon: Haywood Lasso, MD;  Location: [place];  Service: General;  Laterality: Right;  . PORT-A-CATH REMOVAL  01/30/2012   Procedure: MINOR REMOVAL PORT-A-CATH;  Surgeon: Haywood Lasso, MD;  Location: [place];  Service: General;  Laterality: Left;  . PORTACATH PLACEMENT  06/20/2011   Procedure: INSERTION PORT-A-CATH;  Surgeon: Haywood Lasso, MD;  Location: [place];  Service: General;  Laterality: N/A;  . ROBOTIC ASSISTED TOTAL HYSTERECTOMY WITH BILATERAL SALPINGO OOPHERECTOMY Bilateral 12/23/2012   Procedure: ROBOTIC ASSISTED TOTAL HYSTERECTOMY WITH BILATERAL SALPINGO OOPHORECTOMY;  Surgeon: Marvene Staff, MD;  Location: [facility];  Service: Gynecology;  Laterality: Bilateral;  . WISDOM TOOTH EXTRACTION      Social History:  Social History   Socioeconomic History  . Marital status: Single    Spouse name: Not on file  . Number of children: Not on file  . Years of education: Not on file  . Highest education level: Not on file  Social Needs  . Financial resource strain: Not on file  . Food insecurity - worry: Not on file  . Food insecurity - inability: Not on file  . Transportation needs - medical: Not on file  . Transportation needs - non-medical: Not on file  Occupational History  . Not on file  Tobacco Use  . Smoking status: Never Smoker  . Smokeless tobacco: Never Used  Substance and Sexual Activity  . Alcohol use: No  . Drug use: No  . Sexual activity: Not Currently    Birth control/protection: Condom  Other Topics Concern  . Not on file  Social History Narrative  . Not on file  The patient presents today with her sister, she reports that she has a very strong faith and is hopeful to continue aggressive treatments.  Family History: Family History  Problem Relation Age of Onset  . Breast cancer Maternal Aunt 30  . Pancreatic cancer Maternal Grandfather   . Pancreatic cancer Maternal Aunt        died in  her 39s  . Leukemia Maternal Aunt        died in her 59s  . Ovarian cancer Cousin 75       maternal cousin                               ALLERGIES:  is allergic to allegra [fexofenadine]; cat hair extract; shellfish allergy; and glucosamine.  Meds: Current Outpatient Medications  Medication Sig Dispense Refill  . Cholecalciferol (VITAMIN D3) 2000 units TABS Take by mouth daily.    Marland Kitchen gabapentin (NEURONTIN) 100 MG capsule TAKE 1 TO 3 CAPSULES BY MOUTH AT BEDTIME FOR NEUROPATHY 90 capsule 0  . Multiple Vitamin (MULTIVITAMIN) tablet Take 1 tablet by mouth daily.    . polyethylene glycol (MIRALAX / GLYCOLAX) packet Take 17 g by mouth daily. 14 each 0  . prochlorperazine (COMPAZINE) 10 MG tablet Take 1 tablet (10 mg total) by mouth every 6 (  six) hours as needed (Nausea or vomiting). 30 tablet 1  . traMADol (ULTRAM) 50 MG tablet Take 1 tablet (50 mg total) by mouth every 6 (six) hours as needed. 60 tablet 3  . ondansetron (ZOFRAN) 8 MG tablet Take 1 tablet (8 mg total) by mouth every 8 (eight) hours as needed for nausea or vomiting. (Patient not taking: Reported on 01/29/2017) 30 tablet 1   No current facility-administered medications for this encounter.     Physical Findings:  weight is 111 lb 2 oz (50.4 kg). Her oral temperature is 98.1 F (36.7 C). Her blood pressure is 115/69 and her pulse is 89. Her respiration is 20 and oxygen saturation is 100%.   Pain Assessment Pain Score: 0-No pain  0/10 pain  In general this is a thin, appearing African-American female in no acute distress. She's alert and oriented x4 and appropriate throughout the examination. Cardiopulmonary assessment is negative for acute distress and she exhibits normal effort. She appears to be neurologically intact grossly without focal deficits.    Lab Findings: Lab Results  Component Value Date   WBC 3.4 (L) 01/29/2017   HGB 8.8 (L) 01/29/2017   HCT 26.5 (L) 01/29/2017   MCV 87.5 01/29/2017   PLT 17 (L)  01/29/2017     Radiographic Findings: Mr Jeri Cos RD Contrast  Result Date: 01/25/2017 CLINICAL DATA:  47 year old female with metastatic breast cancer. Completed stereotactic radiosurgery to a left parietal lesion on 04/29/2014 to a dose of 20 gray, followed by auto-litt procedure (laser ablation) November 2016. Small right parietal lobe metastasis discovered on MRI in November 2017 status post SRS on 12/1415 to a dose of 20 Gy. Osseous metastatic disease. EXAM: MRI HEAD WITHOUT AND WITH CONTRAST TECHNIQUE: Multiplanar, multiecho pulse sequences of the brain and surrounding structures were obtained without and with intravenous contrast. CONTRAST:  86m MULTIHANCE GADOBENATE DIMEGLUMINE 529 MG/ML IV SOLN COMPARISON:  10/19/2016 and earlier. FINDINGS: Brain: Stable mixed T1 signal, dark T2 signal left periatrial treated lesion (series 5, image 17 and series 10, image 100) with only faint adjacent FLAIR hyperintensity and no regional mass effect. Enlargement of the treated right inferior parietal metastasis which has increased from 4 millimeters to 12-13 millimeters largest dimension as seen on series 10, image 88. Furthermore, confluent surrounding T2 and FLAIR hyperintensity has developed since October with mild regional mass effect suggesting vasogenic edema. See series 10, image 88 and series 7, image 29. Furthermore, the lesion now demonstrates confluent hemosiderin on T2* imaging (series 6, image 16) which is new since July 2018, and only punctate blood products were evident in October. Subsequently, the enhancing portion of the lesion is almost completely dark on T2 weighted imaging (series 5, image 16). Furthermore, the pituitary and pituitary infundibulum have become abnormal, and are now thickened and lobulated with hyperenhancement. Precontrast T1 weighted images suggest the presence of a T1 hypointense enhancing metastasis occupying the posterior aspect of the sella and compressing the remainder of  the gland the hypothalamus remains normal gland anteriorly (series 2, image 19). There is associated abnormal hypothalamic T2 and FLAIR hyperintensity compatible with edema (series 7, image 25). The cavernous sinus appears to remain normal. Small new 5 millimeter enhancing metastasis in the left inferior occipital lobe has developed since October with mild surrounding edema but no significant mass effect. See series 10, image 53. No other abnormal intracranial enhancement identified. No dural thickening. No ventriculomegaly or midline shift. No restricted diffusion or evidence of acute infarction. No acute intracranial hemorrhage  identified. Stable gray and white matter signal elsewhere. Negative cervicomedullary junction. Vascular: Major intracranial vascular flow voids are stable. Skull and upper cervical spine: Diffusely abnormal bone marrow signal in keeping with progressive osseous metastatic disease since July 2017. There is a chronic left temporal bone bone lesion near the semicircular canals (series 5, image 8) which has been stable since 2,016 and is favored to be benign. No definite abnormality of the visible cervical spinal cord. Sinuses/Orbits: Normal orbits soft tissues. Negative paranasal sinuses. Other: Visible internal auditory structures appear normal. Mastoids remain clear. No scalp or face soft tissue abnormality identified. IMPRESSION: 1. Progression of disease since 10/19/2016. 2. 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 Associated hypothalamic edema without significant regional mass effect. - 5 millimeter left inferior occipital lobe. Mild edema with no mass effect. 3. Enlargement of the treated right inferior parietal lobe metastasis from 4 mm now to 12-13 mm with new confluent vasogenic edema and mild regional mass effect. However, the appearance is complicated by evidence of interval hemorrhage within the lesion. Still in light of #2, the appearance is suspicious for  true progression. 4. Progressive osseous metastatic disease since July 2018. It is possible that the pituitary involvement is via contiguous spread from the skull base. 5. The treated left periatrial lesion remains stable. Electronically Signed   By: Genevie Ann M.D.   On: 01/25/2017 11:18    Impression/Plan: 1. 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 The patient is counseled on the findings on her recent brain MRI scan. We reviewed the two new areas, the 4 mm site in the left occipital lobe, and the lesion on the pituitary gland/infindibulum.  I suggested that she meet with an endocrinologist for baseline pituitary function, as well as the plan to follow her long-term given treatment to this site.  She is currently asymptomatic.  She is counseled on the findings that have led to the recommendation for SRS to her tubal sites, we discussed the risks, benefits, short and long-term effects of treatment, and she is interested in proceeding. Written consent is obtained and placed in the chart, a copy was provided to the patient.  I will work with our brain navigator to schedule her treatment and simulation. 2. 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  I recommended that we administer Ativan 30 minutes prior to treatment.  This will be called into her pharmacy. 3. Nausea.  Patient does have a prescription for Zofran and I have encouraged her to try using this 3 times daily so that she is able to maintain her oral intake.  She is in agreement to trying this and requests a refill. 4. Inflammatory change and previously treated site in the brain.  After reviewing her findings in conference this morning, it appears that she has changes consistent with microhemorrhage in her previously treated right parietal lesion.  The patient continues to be asymptomatic and will follow this with a short interval MRI following treatment for #1. 5. Goals of care  and symptom management.  After reading her course with Dr. [name], I felt it would be beneficial for her to be introduced to the palliative care team who sits in on our brain conferences.  She was interested in discussion further with Wadie Lessen, and we appreciate palliative recommendations and guidance for symptom management.    Carola Rhine, PAC

## 2017-01-30 NOTE — Consult Note (Signed)
Consultation Note Date: 01/30/2017   Patient Name: Carolyn [name]  DOB: [birth year]  MRN: [id number]  Age / Sex: 47 y.o., female  PCP: Default, Provider, MD Referring Physician: No att. providers found  Reason for Consultation: Establishing goals of care and Psychosocial/spiritual support   HPI/Patient Profile: 47 y.o. 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  She also receives Niger every 28 days.  Her next dose was due to be 01/31/2017, but we are starting Aranesp today and repeating it every 2 weeks, so I have moved her next Xgeva dose to 02/12/2017.  Importantly, since her last visit here she completed a MRI of the brain on 01/26/2016 showing: Progression of disease since 10/19/2016.   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   Associated hypothalamic edema without significant regional mass effect. 5 millimeter left inferior occipital lobe. Mild edema with no mass effect. Enlargement of the treated right inferior parietal lobe metastasis from 4 mm now to 12-13 mm with new confluent vasogenic edema and mild regional mass effect.   However, the appearance is complicated by evidence of interval hemorrhage within the lesion. The appearance is suspicious for true progression. Progressive osseous metastatic disease since July 2018. It is possible that the pituitary involvement is via contiguous spread from the skull base. The treated left periatrial lesion remains stable  Patient is now 3 years out from definitive diagnosis of metastatic breast cancer.  Unfortunately she has further progression in the brain.  The plan at this point is for further stereotactic radiosurgery.    Patient faces the physical, emotional and financial struggles of living with terminal illness.    Clinical Assessment  and Goals of Care:  This NP Wadie Lessen reviewed medical records, received report from team,  and then met with  the patient along with her sister/Kim Coppinger in the OP Radiation Oncology clinic as an introduction to the role of palliative medicine team  in a holistic, patient centered plan of care.  Patient was engaged and pleasant as she shared her thoughts and feelings regarding her current medical situation.  She understands the seriousness of her situation but remains deep in  her faith for continued quality of life.  Values and goals of care important to patient and family were attempted to be elicited.  A  discussion was had today regarding advanced directives.  Discussed the importance of documentation of  and offered assistance through the cancer center.   Discussed with patient the importance of continued conversation with family and their  medical providers regarding overall plan of care and treatment options,  ensuring decisions are within the context of the patients values and GOCs.   Questions and concerns addressed.   Family encouraged to call with questions or concerns.    PMT will continue to support holistically.   PATIENT    SUMMARY OF RECOMMENDATIONS    Code Status/Advance Care Planning:  Full code   Symptom Management:  Fatigue:-Pace yourself              -Plan your day              -Include naps and breaks              -schedule a relaxing day              -get a little exercise- detailed use of gentle stretch              -fuel the body- nutrition and hydration              -consider complementary therapies              -deep breathing              -prayer/medication               -guided meditation  Additional Recommendations (Limitations, Scope, Preferences):  Patient is open to all offered and available medical interventions to prolong quality of life.  Psycho-social/Spiritual:   Emotional support offered.   Discussed the role of prayer as coping  mechanism and relaxation technique    Strong faith and community church support  Prognosis:   Unable to determine     Primary Diagnoses: Present on Admission: **None**   I have reviewed the medical record, interviewed the patient and family, and examined the patient. The following aspects are pertinent.  Past Medical History:  Diagnosis Date  . 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 History of chemotherapy 06/27/11 -09/04/11   neoadjuvant  . Hx of radiation therapy 11/08/11 -12/26/11   right chest wall/supraclav fossa, right scar  . S/P radiation therapy 05/08/14   SRS brain   Social History   Socioeconomic History  . Marital status: Single    Spouse name: Not on file  . Number of children: Not on file  . Years of education: Not on file  . Highest education level: Not on file  Social Needs  . Financial resource strain: Not on file  . Food insecurity - worry: Not on file  . Food insecurity - inability: Not on file  . Transportation needs - medical: Not on file  . Transportation needs - non-medical: Not on file  Occupational History  . Not on file  Tobacco Use  . Smoking status: Never Smoker  . Smokeless tobacco: Never Used  Substance and Sexual Activity  . Alcohol use: No  . Drug use: No  . Sexual activity: Not Currently    Birth control/protection: Condom  Other Topics Concern  . Not on file  Social History Narrative  . Not on file   Family History  Problem Relation Age of Onset  . Breast cancer Maternal Aunt 80  . Pancreatic cancer Maternal Grandfather   . Pancreatic cancer Maternal Aunt        died in her 73s  . Leukemia Maternal Aunt        died in her 90s  . Ovarian cancer Cousin 65       maternal cousin   Scheduled Meds: Continuous Infusions: PRN Meds:. Medications Prior to Admission:  Prior to Admission medications   Medication Sig Start Date End Date Taking? Authorizing Provider    Cholecalciferol (VITAMIN D3) 2000 units TABS Take by mouth daily.    [provider]  gabapentin (NEURONTIN) 100 MG capsule TAKE 1 TO 3 CAPSULES BY MOUTH AT BEDTIME FOR NEUROPATHY  01/05/17   Gardenia Phlegm, NP  LORazepam (ATIVAN) 0.5 MG tablet 1 tablet po 30 minutes prior to radiation or MRI 01/29/17   Hayden Pedro, PA-C  Multiple Vitamin (MULTIVITAMIN) tablet Take 1 tablet by mouth daily.    [provider]  ondansetron (ZOFRAN) 8 MG tablet Take 1 tablet (8 mg total) by mouth every 8 (eight) hours as needed for nausea or vomiting. 01/29/17   Hayden Pedro, PA-C  polyethylene glycol [facility] / GLYCOLAX) packet Take 17 g by mouth daily. 09/15/16   Georgette Shell, MD  prochlorperazine (COMPAZINE) 10 MG tablet Take 1 tablet (10 mg total) by mouth every 6 (six) hours as needed (Nausea or vomiting). 12/13/16   Magrinat, Virgie Dad, MD  traMADol (ULTRAM) 50 MG tablet Take 1 tablet (50 mg total) by mouth every 6 (six) hours as needed. 08/28/16   Magrinat, Virgie Dad, MD   Allergies  Allergen Reactions  . Allegra [Fexofenadine] Hives    Abdomen only  . Cat Hair Extract Other (See Comments)  . Shellfish Allergy Hives    Abdomen only  . Glucosamine Rash   Review of Systems  Constitutional: Positive for fatigue and unexpected weight change.    Physical Exam  Constitutional: She is oriented to person, place, and time. She appears cachectic. She appears ill.  Musculoskeletal:  Slow shuffle gait   Neurological: She is alert and oriented to person, place, and time.  Skin: Skin is warm and dry.    Vital Signs: LMP 07/05/2011 (Exact Date)          SpO2:   O2 Device:  O2 Flow Rate: .   IO: Intake/output summary: No intake or output data in the 24 hours ending 01/30/17 0800  LBM:   Baseline Weight:   Most recent weight:       Palliative Assessment/Data: 60%   Discussed with Worthy Flank PA-C/Dr Lisbeth Renshaw  Encouraged patient to call for f/u  appointments   Time In: 0815 Time Out: 0930 Time Total: 75 minutes Greater than 50%  of this time was spent counseling and coordinating care related to the above assessment and plan.  Signed by: Wadie Lessen, NP   Please contact Palliative Medicine Team phone at [phone] for questions and concerns.  For individual provider: See Shea Evans

## 2017-01-31 ENCOUNTER — Telehealth: Payer: Self-pay | Admitting: *Deleted

## 2017-01-31 ENCOUNTER — Telehealth: Payer: Self-pay | Admitting: Oncology

## 2017-01-31 ENCOUNTER — Ambulatory Visit: Payer: BC Managed Care – PPO | Admitting: Radiation Oncology

## 2017-01-31 ENCOUNTER — Ambulatory Visit: Payer: BC Managed Care – PPO

## 2017-01-31 DIAGNOSIS — R530 Neoplastic (malignant) related fatigue: Secondary | ICD-10-CM | POA: Insufficient documentation

## 2017-01-31 DIAGNOSIS — Z7189 Other specified counseling: Secondary | ICD-10-CM | POA: Insufficient documentation

## 2017-01-31 DIAGNOSIS — Z515 Encounter for palliative care: Secondary | ICD-10-CM | POA: Insufficient documentation

## 2017-01-31 NOTE — Telephone Encounter (Signed)
Scheduled appt per 1/21 los - spoke with patient regarding appts that were added

## 2017-01-31 NOTE — Telephone Encounter (Signed)
Patient will see Dr. Buddy Duty on 02-12-17 @ 2 pm, patient is aware of this appt.

## 2017-02-02 ENCOUNTER — Ambulatory Visit
Admission: RE | Admit: 2017-02-02 | Discharge: 2017-02-02 | Disposition: A | Discharge status: Home / Self Care | Payer: BC Managed Care – PPO | Source: Ambulatory Visit | Attending: Radiation Oncology | Admitting: Radiation Oncology

## 2017-02-02 DIAGNOSIS — C7931 Secondary malignant neoplasm of brain: Secondary | ICD-10-CM

## 2017-02-02 NOTE — Progress Notes (Addendum)
Has armband been applied?  Yes  Does patient have an allergy to IV contrast dye?: No   Has patient ever received premedication for IV contrast dye?: No  Does patient take metformin?: No.  If patient does take metformin when was the last dose: n/a  Date of lab work: January 29, 2017 BUN: 23 CR: 1.31  IV site: {iv locations:  Left antecubital at 0840  Has IV site been added to flowsheet?  Yes  LMP 07-05-11 Hysterectomy BP 102/62 (BP Location: Left Arm, Patient Position: Sitting, Cuff Size: Normal)   Pulse 93   Temp (!) 97.5 F (36.4 C) (Oral)   Resp 18   Ht 5\' 8"  (1.727 m)   Wt 113 lb 12.8 oz (51.6 kg)   LMP 07/05/2011 (Exact Date)   SpO2 95%   BMI 17.30 kg/m

## 2017-02-06 ENCOUNTER — Telehealth: Payer: Self-pay | Admitting: *Deleted

## 2017-02-06 ENCOUNTER — Other Ambulatory Visit: Payer: Self-pay | Admitting: *Deleted

## 2017-02-06 ENCOUNTER — Inpatient Hospital Stay: Payer: BC Managed Care – PPO

## 2017-02-06 ENCOUNTER — Inpatient Hospital Stay (HOSPITAL_BASED_OUTPATIENT_CLINIC_OR_DEPARTMENT_OTHER): Payer: BC Managed Care – PPO | Admitting: Medical

## 2017-02-06 VITALS — BP 101/53 | HR 99 | Temp 98.3°F | Resp 18 | Ht 68.0 in | Wt 112.5 lb

## 2017-02-06 DIAGNOSIS — D649 Anemia, unspecified: Secondary | ICD-10-CM

## 2017-02-06 DIAGNOSIS — D61818 Other pancytopenia: Secondary | ICD-10-CM

## 2017-02-06 DIAGNOSIS — R569 Unspecified convulsions: Secondary | ICD-10-CM

## 2017-02-06 DIAGNOSIS — C7931 Secondary malignant neoplasm of brain: Secondary | ICD-10-CM

## 2017-02-06 DIAGNOSIS — C50411 Malignant neoplasm of upper-outer quadrant of right female breast: Secondary | ICD-10-CM | POA: Diagnosis not present

## 2017-02-06 DIAGNOSIS — C7951 Secondary malignant neoplasm of bone: Secondary | ICD-10-CM

## 2017-02-06 DIAGNOSIS — D696 Thrombocytopenia, unspecified: Secondary | ICD-10-CM | POA: Diagnosis not present

## 2017-02-06 DIAGNOSIS — C50919 Malignant neoplasm of unspecified site of unspecified female breast: Secondary | ICD-10-CM

## 2017-02-06 LAB — CBC WITH DIFFERENTIAL (CANCER CENTER ONLY)
Basophils Absolute: 0 10*3/uL (ref 0.0–0.1)
Basophils Relative: 1 %
EOS PCT: 1 %
Eosinophils Absolute: 0 10*3/uL (ref 0.0–0.5)
HEMATOCRIT: 18.2 % — AB (ref 34.8–46.6)
Hemoglobin: 6.1 g/dL — CL (ref 11.6–15.9)
LYMPHS PCT: 44 %
Lymphs Abs: 0.8 10*3/uL — ABNORMAL LOW (ref 0.9–3.3)
MCH: 29.3 pg (ref 25.1–34.0)
MCHC: 33.8 g/dL (ref 31.5–36.0)
MCV: 86.7 fL (ref 79.5–101.0)
MONO ABS: 0.1 10*3/uL (ref 0.1–0.9)
Monocytes Relative: 6 %
NEUTROS ABS: 0.9 10*3/uL — AB (ref 1.5–6.5)
Neutrophils Relative %: 48 %
Platelet Count: 15 10*3/uL — ABNORMAL LOW (ref 145–400)
RBC: 2.1 MIL/uL — ABNORMAL LOW (ref 3.70–5.45)
RDW: 14.9 % (ref 11.2–16.1)
Smear Review: 1
WBC: 1.8 10*3/uL — AB (ref 3.9–10.3)

## 2017-02-06 LAB — CMP (CANCER CENTER ONLY)
ALBUMIN: 3.1 g/dL — AB (ref 3.5–5.0)
ALT: 39 U/L (ref 0–55)
AST: 55 U/L — AB (ref 5–34)
Alkaline Phosphatase: 168 U/L — ABNORMAL HIGH (ref 40–150)
Anion gap: 10 (ref 3–11)
BILIRUBIN TOTAL: 0.7 mg/dL (ref 0.2–1.2)
BUN: 30 mg/dL — AB (ref 7–26)
CHLORIDE: 109 mmol/L (ref 98–109)
CO2: 18 mmol/L — ABNORMAL LOW (ref 22–29)
CREATININE: 1.3 mg/dL — AB (ref 0.60–1.10)
Calcium: 8.8 mg/dL (ref 8.4–10.4)
GFR, EST NON AFRICAN AMERICAN: 48 mL/min — AB (ref >60)
GFR, Est AFR Am: 56 mL/min — ABNORMAL LOW (ref >60)
GLUCOSE: 76 mg/dL (ref 70–140)
POTASSIUM: 4.7 mmol/L (ref 3.3–4.7)
Sodium: 137 mmol/L (ref 136–145)
TOTAL PROTEIN: 6.6 g/dL (ref 6.4–8.3)

## 2017-02-06 LAB — PREPARE PLATELET PHERESIS: Unit division: 0

## 2017-02-06 LAB — SAMPLE TO BLOOD BANK

## 2017-02-06 LAB — BPAM PLATELET PHERESIS
BLOOD PRODUCT EXPIRATION DATE: 201901212359
ISSUE DATE / TIME: 201901211357
UNIT TYPE AND RH: 6200

## 2017-02-06 LAB — PREPARE RBC (CROSSMATCH)

## 2017-02-06 MED ORDER — HEPARIN SOD (PORK) LOCK FLUSH 100 UNIT/ML IV SOLN
500.0000 [IU] | Freq: Every day | INTRAVENOUS | Status: DC | PRN
  Filled 2017-02-06: qty 5

## 2017-02-06 MED ORDER — DIPHENHYDRAMINE HCL 25 MG PO CAPS
25.0000 mg | ORAL_CAPSULE | Freq: Once | ORAL | Status: AC
  Administered 2017-02-06: 25 mg via ORAL

## 2017-02-06 MED ORDER — DIPHENHYDRAMINE HCL 25 MG PO CAPS
ORAL_CAPSULE | ORAL | Status: AC
  Filled 2017-02-06: qty 1

## 2017-02-06 MED ORDER — ACETAMINOPHEN 325 MG PO TABS
ORAL_TABLET | ORAL | Status: AC
  Filled 2017-02-06: qty 2

## 2017-02-06 MED ORDER — LEVETIRACETAM 500 MG PO TABS
500.0000 mg | ORAL_TABLET | Freq: Two times a day (BID) | ORAL | 5 refills | Status: AC
Start: 2017-02-06 — End: ?

## 2017-02-06 MED ORDER — SODIUM CHLORIDE 0.9 % IV SOLN
250.0000 mL | Freq: Once | INTRAVENOUS | Status: AC
  Administered 2017-02-06: 250 mL via INTRAVENOUS

## 2017-02-06 MED ORDER — SODIUM CHLORIDE 0.9% FLUSH
10.0000 mL | INTRAVENOUS | Status: DC | PRN
  Filled 2017-02-06: qty 10

## 2017-02-06 MED ORDER — ACETAMINOPHEN 325 MG PO TABS
650.0000 mg | ORAL_TABLET | Freq: Once | ORAL | Status: AC
  Administered 2017-02-06: 650 mg via ORAL

## 2017-02-06 MED FILL — levETIRAcetam 500 MG TABS: 500 | 30 days supply | Qty: 60 | Fill #0

## 2017-02-06 NOTE — Telephone Encounter (Signed)
Per MD this RN ordered Txx as well as unit of plts - this RN left message with infusion room requesting chair for later this afternoon.  Also contacted blood bank to request unit of plts to have available for transfusion.  Orders entered per above and prepare plt order released for blood bank.

## 2017-02-06 NOTE — Patient Instructions (Addendum)
latPlatelet Transfusion A platelet transfusion is a procedure in which you receive donated platelets through an IV tube. Platelets are tiny pieces of blood cells. When a blood vessel is damaged, platelets collect in the damaged area to help form a blood clot. This begins the healing process. If your platelet count gets too low, your blood may have trouble clotting. You may need a platelet transfusion if you have a condition that causes a low number of platelets (thrombocytopenia). A platelet transfusion may be used to stop or prevent bleeding. Tell a health care provider about:  Any allergies you have.  All medicines you are taking, including vitamins, herbs, eye drops, creams, and over-the-counter medicines.  Any problems you or family members have had with anesthetic medicines.  Any blood disorders you have.  Any surgeries you have had.  Any medical conditions you have.  Any reactions you have had during a previous transfusion. What are the risks? Generally, this is a safe procedure. However, problems may occur, including:  Fever with or without chills. The fever usually occurs within the first 4 hours of the transfusion and returns to normal within 48 hours.  Allergic reaction. The reaction is most commonly caused by antibodies your body creates against substances in the transfusion. Signs of an allergic reaction may include itching, hives, difficulty breathing, shock, or low blood pressure.  Sudden (acute) or delayed hemolytic reaction. This rare reaction can occur during the transfusion and up to 28 days after the transfusion. The reaction usually occurs when your body's defense system (immune system) attacks the new platelets. Signs of a hemolytic reaction may include fever, headache, difficulty breathing, low blood pressure, a rapid heartbeat, or pain in your back, abdomen, chest, or IV site.  Transfusion-related acute lung injury (TRALI). TRALI can occur within hours of a  transfusion, or several days later. This is a rare reaction that causes lung damage. The cause is not known.  Infection. Signs of this rare complication may include fever, chills, vomiting, a rapid heartbeat, or low blood pressure.  What happens before the procedure?  You may have a blood test to determine your blood type. This is necessary to find out what kind ofplatelets best matches your platelets.  If you have had an allergic reaction to a transfusion in the past, you may be given medicine to help prevent a reaction. Take this medicine only as directed by your health care provider.  Your temperature, blood pressure, and pulse will be monitored before the transfusion. What happens during the procedure?  An IV will be started in your hand or arm.  The transfusion will be attached to your IV tubing. The bag of donated platelets will be attached to your IV tube andgiven into your vein.  Your temperature, blood pressure, and pulse will be monitored regularly during the transfusion. This monitoring is done to help detect early signs of a transfusion reaction.  If you have any signs or symptoms of a reaction, your transfusion will be stopped and you may be given medicine.  When your transfusion is complete, your IV will be removed.  Pressure may be applied to the IV site for a few minutes.  A bandage (dressing) will be applied. The procedure may vary among health care providers and hospitals. What happens after the procedure?  Your blood pressure, temperature, and pulse will be monitored regularly. This information is not intended to replace advice given to you by your health care provider. Make sure you discuss any questions you have  with your health care provider. Document Released: 10/23/2006 Document Revised: 06/03/2015 Document Reviewed: 11/05/2013 Elsevier Interactive Patient Education  2018 [employer]  

## 2017-02-06 NOTE — Telephone Encounter (Signed)
This RN returned VM left by pt stating onset of nose bleeding " with clots " - bleeding is not constant but " you know your nose feels clogged and then it starts bleeding "- and " my legs are like jumping "  Note pt is scheduled to start whole brain radiation tomorrow.  Per MD review - requested for pt to come in assessment of above and to be TXX for transfusion if needed.  This RN discussed with pt who will need to arrange transportation - and states she can be here by 1pm.  In Box message sent per above.  STAT orders placed per above.

## 2017-02-06 NOTE — Progress Notes (Signed)
Pt c/o legs cramping R>L, jerking, jumping x 1 week. She states it even extends up to her abdomen at times. [name], PA made aware.   Pt to receive 1 unit of platelets today in the infusion area  and 2 units of blood tomorrow @ the [facility] @ 10am. Pt aware of this and agrees to the plan. She states she is used to a low HGB and can wait until tomorrow morning to get both units in the same day. Pt transferred to infusion area via w/c. Report given to [place], [place]

## 2017-02-07 ENCOUNTER — Encounter: Payer: Self-pay | Admitting: Neurological Surgery

## 2017-02-07 ENCOUNTER — Encounter: Payer: Self-pay | Admitting: Radiation Oncology

## 2017-02-07 ENCOUNTER — Inpatient Hospital Stay (HOSPITAL_BASED_OUTPATIENT_CLINIC_OR_DEPARTMENT_OTHER): Payer: BC Managed Care – PPO | Admitting: Internal Medicine

## 2017-02-07 ENCOUNTER — Ambulatory Visit
Admission: RE | Admit: 2017-02-07 | Discharge: 2017-02-07 | Disposition: A | Discharge status: Home / Self Care | Payer: BC Managed Care – PPO | Source: Ambulatory Visit | Attending: Radiation Oncology | Admitting: Radiation Oncology

## 2017-02-07 ENCOUNTER — Encounter: Payer: Self-pay | Admitting: Internal Medicine

## 2017-02-07 ENCOUNTER — Ambulatory Visit (HOSPITAL_COMMUNITY)
Admission: RE | Admit: 2017-02-07 | Discharge: 2017-02-07 | Disposition: A | Discharge status: Home / Self Care | Payer: BC Managed Care – PPO | Source: Ambulatory Visit | Attending: Oncology | Admitting: Oncology

## 2017-02-07 VITALS — BP 97/60 | HR 93 | Temp 98.0°F | Resp 20

## 2017-02-07 VITALS — BP 118/61 | HR 108 | Temp 100.0°F | Resp 32 | Ht 68.0 in | Wt 112.0 lb

## 2017-02-07 DIAGNOSIS — C7931 Secondary malignant neoplasm of brain: Secondary | ICD-10-CM

## 2017-02-07 DIAGNOSIS — D61818 Other pancytopenia: Secondary | ICD-10-CM

## 2017-02-07 DIAGNOSIS — R569 Unspecified convulsions: Secondary | ICD-10-CM | POA: Diagnosis not present

## 2017-02-07 DIAGNOSIS — D649 Anemia, unspecified: Secondary | ICD-10-CM | POA: Diagnosis not present

## 2017-02-07 DIAGNOSIS — C50411 Malignant neoplasm of upper-outer quadrant of right female breast: Secondary | ICD-10-CM | POA: Diagnosis not present

## 2017-02-07 DIAGNOSIS — C50919 Malignant neoplasm of unspecified site of unspecified female breast: Secondary | ICD-10-CM

## 2017-02-07 LAB — PREPARE PLATELET PHERESIS: UNIT DIVISION: 0

## 2017-02-07 LAB — BPAM PLATELET PHERESIS
BLOOD PRODUCT EXPIRATION DATE: 201901312359
ISSUE DATE / TIME: 201901291625
Unit Type and Rh: 6200

## 2017-02-07 MED ORDER — SODIUM CHLORIDE 0.9 % IV SOLN: 250.0000 mL | Freq: Once | INTRAVENOUS | Status: DC

## 2017-02-07 MED ORDER — SODIUM CHLORIDE 0.9% FLUSH: 3.0000 mL | INTRAVENOUS | Status: DC | PRN

## 2017-02-07 MED ORDER — DIPHENHYDRAMINE HCL 25 MG PO CAPS
25.0000 mg | ORAL_CAPSULE | Freq: Once | ORAL | Status: AC
  Administered 2017-02-07: 25 mg via ORAL
  Filled 2017-02-07: qty 1

## 2017-02-07 MED ORDER — HEPARIN SOD (PORK) LOCK FLUSH 100 UNIT/ML IV SOLN: 500.0000 [IU] | Freq: Every day | INTRAVENOUS | Status: DC | PRN

## 2017-02-07 MED ORDER — ACETAMINOPHEN 325 MG PO TABS
650.0000 mg | ORAL_TABLET | Freq: Once | ORAL | Status: AC
  Administered 2017-02-07: 650 mg via ORAL
  Filled 2017-02-07: qty 2

## 2017-02-07 MED ORDER — SODIUM CHLORIDE 0.9% FLUSH: 10.0000 mL | INTRAVENOUS | Status: DC | PRN

## 2017-02-07 MED ORDER — HEPARIN SOD (PORK) LOCK FLUSH 100 UNIT/ML IV SOLN: 250.0000 [IU] | INTRAVENOUS | Status: DC | PRN

## 2017-02-07 NOTE — Progress Notes (Signed)
  Radiation Oncology         [phone] ________________________________  Name: Carolyn [name] MRN: [id number]  Date: 02/02/2017  DOB: 12/12/1970  DIAGNOSIS:     ICD-10-CM   1. Brain metastasis ([place]) C79.31     NARRATIVE:  The patient was brought to the [facility].  Identity was confirmed.  All relevant records and images related to the planned course of therapy were reviewed.  The patient freely provided informed written consent to proceed with treatment after reviewing the details related to the planned course of therapy. The consent form was witnessed and verified by the simulation staff. Intravenous access was established for contrast administration. Then, the patient was set-up in a stable reproducible supine position for radiation therapy.  A relocatable thermoplastic stereotactic head frame was fabricated for precise immobilization.  CT images were obtained.  Surface markings were placed.  The CT images were loaded into the planning software and fused with the patient's targeting MRI scan.  Then the target and avoidance structures were contoured.  Treatment planning then occurred.  The radiation prescription was entered and confirmed.  I have requested 3D planning  I have requested a DVH of the following structures: Brain stem, brain, left eye, right eye, lenses, optic chiasm, target volumes, uninvolved brain, and normal tissue.    SPECIAL TREATMENT PROCEDURE:  The planned course of therapy using radiation constitutes a special treatment procedure. Special care is required in the management of this patient for the following reasons. This treatment constitutes a Special Treatment Procedure for the following reason: High dose per fraction requiring special monitoring for increased toxicities of treatment including daily imaging.  The special nature of the planned course of radiotherapy will require increased physician supervision and oversight to ensure patient's safety with  optimal treatment outcomes.  PLAN:  The patient will receive 20 Gy in 1 fraction to the intracranial metastasis and 14 Gy in 1 fraction to the pituitary metastasis.   ------------------------------------------------  [name], MD, PhD

## 2017-02-07 NOTE — Progress Notes (Signed)
[facility] at [address], [place] [address] [phone]   New Patient Evaluation  Date of Service: 02/07/17 Patient Name: Carolyn [name] Patient MRN: [id number] Patient DOB: 13-Jun-1970 Provider: Ventura Sellers, MD  Identifying Statement:  Carolyn [name] is a 47 y.o. 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    Referring Provider: No referring provider defined for this encounter.  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  [name] presents to clinic today to review recent neurologic complaint, described as "involuntary twitching and shaking of the right leg".  The movement is described as brief and jerky, but "coming in waves" and lasting more than a few seconds.  This has been happening almost daily for the past several weeks, and there has been some increase in intensity and volume recently.  There may be some spreading to the left leg on occasion, but never to the arms or face.  She describes chronic weakness of both legs, requiring a can for ambulation.  Today she underwent SRS treatment to the left occipital and pituitary regions based on recent MRI progression.  She is aware that the previously treated lesion in the right parietal lobe appears progressive as well.  Otherwise, she denies headaches, incontinence, frank loss of consciousness.  Medications: Current Outpatient Medications on File Prior to Visit  Medication Sig Dispense Refill  . Cholecalciferol (VITAMIN D3) 2000 units TABS Take by mouth daily.    Marland Kitchen gabapentin (NEURONTIN) 100 MG capsule TAKE 1 TO 3 CAPSULES BY MOUTH AT BEDTIME  FOR NEUROPATHY 90 capsule 0  . levETIRAcetam (KEPPRA) 500 MG tablet Take 1 tablet (500 mg total) by mouth 2 (two) times daily. 60 tablet 5  . LORazepam (ATIVAN) 0.5 MG tablet 1 tablet po 30 minutes prior to radiation or MRI 30 tablet 0  . Multiple Vitamin (MULTIVITAMIN) tablet Take 1 tablet by mouth daily.    . ondansetron (ZOFRAN) 8 MG tablet Take 1 tablet (8 mg total) by mouth every 8 (eight) hours as needed for nausea or vomiting. 30 tablet 4  . polyethylene glycol (MIRALAX / GLYCOLAX) packet Take 17 g by mouth daily. 14 each 0  . prochlorperazine (COMPAZINE) 10 MG tablet Take 1 tablet (10 mg total) by mouth every 6 (six) hours as needed (Nausea or vomiting). 30 tablet 1  . traMADol (ULTRAM) 50 MG tablet Take 1 tablet (50 mg total) by mouth every 6 (six) hours as needed. 60 tablet 3   Current Facility-Administered Medications on File Prior to Visit  Medication Dose Route Frequency Provider Last Rate Last Dose  . heparin lock flush 100 unit/mL  500 Units Intracatheter Daily PRN Magrinat, Virgie Dad, MD      . sodium chloride flush (NS) 0.9 % injection 10 mL  10 mL Intracatheter PRN Magrinat, Virgie Dad, MD        Allergies:  Allergies  Allergen Reactions  . Allegra [Fexofenadine] Hives    Abdomen only  . Cat Hair Extract Other (See Comments)  . Shellfish Allergy Hives    Abdomen only  . Glucosamine Rash   Past Medical History:  Past Medical History:  Diagnosis Date  . 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 History of chemotherapy 06/27/11 -09/04/11   neoadjuvant  . Hx of radiation therapy 11/08/11 -12/26/11   right chest wall/supraclav fossa, right scar  . S/P radiation therapy 05/08/14   SRS brain   Past Surgical History:  Past Surgical History:  Procedure Laterality Date  . brain surgery-LITT Left 11/20/14   LITT procedure for Lt brain met.  Marland Kitchen BREAST BIOPSY     Left  . CHOLECYSTECTOMY N/A 03/01/2014   Procedure:  LAPAROSCOPIC CHOLECYSTECTOMY;  Surgeon: Leighton Ruff, MD;  Location: [facility];  Service: General;  Laterality: N/A;  . LAMINECTOMY N/A 04/13/2014   Procedure: THORACIC LAMINECTOMY WITH FIXATION THORACIC SIX-THORACIC TEN FUSION;  Surgeon: Kristeen Miss, MD;  Location: [facility];  Service: Neurosurgery;  Laterality: N/A;  . MODIFIED MASTECTOMY  10/03/2011   Procedure: MODIFIED MASTECTOMY;  Surgeon: Haywood Lasso, MD;  Location: [facility];  Service: General;  Laterality: Right;  . PORT-A-CATH REMOVAL  01/30/2012   Procedure: MINOR REMOVAL PORT-A-CATH;  Surgeon: Haywood Lasso, MD;  Location: [facility];  Service: General;  Laterality: Left;  . PORTACATH PLACEMENT  06/20/2011   Procedure: INSERTION PORT-A-CATH;  Surgeon: Haywood Lasso, MD;  Location: [facility];  Service: General;  Laterality: N/A;  . ROBOTIC ASSISTED TOTAL HYSTERECTOMY WITH BILATERAL SALPINGO OOPHERECTOMY Bilateral 12/23/2012   Procedure: ROBOTIC ASSISTED TOTAL HYSTERECTOMY WITH BILATERAL SALPINGO OOPHORECTOMY;  Surgeon: Marvene Staff, MD;  Location: [facility];  Service: Gynecology;  Laterality: Bilateral;  . WISDOM TOOTH EXTRACTION     Social History:  Social History   Socioeconomic History  . Marital status: Single    Spouse name: Not on file  . Number of children: Not on file  . Years of education: Not on file  . Highest education level: Not on file  Social Needs  . Financial resource strain: Not on file  . Food insecurity - worry: Not on file  . Food insecurity - inability: Not on file  . Transportation needs - medical: Not on file  . Transportation needs - non-medical: Not on file  Occupational History  . Not on file  Tobacco Use  . Smoking status: Never Smoker  . Smokeless tobacco: Never Used  Substance and Sexual Activity  . Alcohol use: No  . Drug use: No  . Sexual activity: Not Currently    Birth control/protection: Condom  Other Topics Concern  . Not on file    Social History Narrative  . Not on file   Family History:  Family History  Problem Relation Age of Onset  . Breast cancer Maternal Aunt 68  . Pancreatic cancer Maternal Grandfather   . Pancreatic cancer Maternal Aunt        died in her 21s  . Leukemia Maternal Aunt        died in her 92s  . Ovarian cancer Cousin 39       maternal cousin    Review of Systems: Constitutional: "weak" feeling Eyes: Denies blurriness of vision Ears, nose, mouth, throat, and face: Denies mucositis or sore throat Respiratory: Denies cough, dyspnea or wheezes Cardiovascular: Denies palpitation, chest discomfort or lower extremity swelling Gastrointestinal:  Nausea GU: Denies dysuria or incontinence Skin: Denies abnormal skin rashes Neurological: Per HPI Musculoskeletal: +body pain Behavioral/Psych: +anxiety   Physical Exam: Vitals:   02/07/17 1008  BP: 118/61  Pulse: (!) 108  Resp: (!) 32  Temp: 100 F (37.8 C)  SpO2: 94%  KPS: 70. General: Shivering, cold Head: Normal EENT: No conjunctival injection or scleral icterus. Oral mucosa moist Lungs: Resp effort normal Cardiac: Tachycardic Abdomen: Soft, non-distended abdomen Skin: No rashes cyanosis or petechiae. Extremities: No clubbing or edema, some atrophy/wasting noted  Neurologic Exam: Mental Status: Awake, alert, attentive to examiner. Oriented to self and environment. Language is fluent with intact comprehension.  Cranial Nerves: Visual acuity is grossly normal. Visual fields are full. Extra-ocular movements intact. No ptosis. Face is symmetric, tongue midline. Motor: Diffuse atrophy. Power is full in both arms but impaired in legs, right leg 4/5 and left is 4+/5. Leg reflexes are very brisk R>L in myelopathic pattern, no pathologic reflexes present or clonus.  Arm reflexes also brisk with inducible clonic movements. Intact finger to nose bilaterally Sensory: Impaired sensation to temperature right leg Gait: Deferred   Labs: I  have reviewed the data as listed    Component Value Date/Time   NA 137 02/06/2017 1328   NA 141 01/08/2017 0842   K 4.7 02/06/2017 1328   K 4.5 01/08/2017 0842   CL 109 02/06/2017 1328   CL 102 01/16/2012 0804   CO2 18 (L) 02/06/2017 1328   CO2 25 01/08/2017 0842   GLUCOSE 76 02/06/2017 1328   GLUCOSE 109 01/08/2017 0842   GLUCOSE 92 01/16/2012 0804   BUN 30 (H) 02/06/2017 1328   BUN 26.9 (H) 01/08/2017 0842   CREATININE 1.31 (H) 01/29/2017 0905   CREATININE 1.2 (H) 01/08/2017 0842   CALCIUM 8.8 02/06/2017 1328   CALCIUM 9.4 01/08/2017 0842   PROT 6.6 02/06/2017 1328   PROT 7.8 01/08/2017 0842   ALBUMIN 3.1 (L) 02/06/2017 1328   ALBUMIN 3.5 01/08/2017 0842   AST 55 (H) 02/06/2017 1328   AST 39 (H) 01/08/2017 0842   ALT 39 02/06/2017 1328   ALT 23 01/08/2017 0842   ALKPHOS 168 (H) 02/06/2017 1328   ALKPHOS 185 (H) 01/08/2017 0842   BILITOT 0.7 02/06/2017 1328   BILITOT 0.33 01/08/2017 0842   GFRNONAA 48 (L) 02/06/2017 1328   GFRAA 56 (L) 02/06/2017 1328   Lab Results  Component Value Date   WBC 1.8 (L) 02/06/2017   NEUTROABS 0.9 (L) 02/06/2017   HGB 8.8 (L) 01/29/2017   HCT 18.2 (L) 02/06/2017   MCV 86.7 02/06/2017   PLT 15 (L) 02/06/2017    Imaging:  Mr Jeri Cos BP Contrast  Result Date: 01/25/2017 CLINICAL DATA:  47 year old female with metastatic breast cancer. Completed stereotactic radiosurgery to a left parietal lesion on 04/29/2014 to a dose of 20 gray, followed by auto-litt procedure (laser ablation) November 2016. Small right parietal lobe metastasis discovered on MRI in November 2017 status post SRS on 12/1415 to a dose of 20 Gy. Osseous metastatic disease. EXAM: MRI HEAD WITHOUT AND WITH CONTRAST TECHNIQUE: Multiplanar, multiecho pulse sequences of the brain and surrounding structures were obtained without and with intravenous contrast. CONTRAST:  35m MULTIHANCE GADOBENATE DIMEGLUMINE 529 MG/ML IV SOLN COMPARISON:  10/19/2016 and earlier. FINDINGS: Brain:  Stable mixed T1 signal, dark T2 signal left periatrial treated lesion (series 5, image 17 and series 10, image 100) with only faint adjacent FLAIR hyperintensity and no regional mass effect. Enlargement of the treated right inferior parietal metastasis which has increased from 4 millimeters to 12-13 millimeters largest dimension as seen on series 10, image 88. Furthermore, confluent surrounding T2 and FLAIR hyperintensity has developed since October with mild regional mass effect suggesting vasogenic edema. See series 10, image 88 and series 7, image  29. Furthermore, the lesion now demonstrates confluent hemosiderin on T2* imaging (series 6, image 16) which is new since July 2018, and only punctate blood products were evident in October. Subsequently, the enhancing portion of the lesion is almost completely dark on T2 weighted imaging (series 5, image 16). Furthermore, the pituitary and pituitary infundibulum have become abnormal, and are now thickened and lobulated with hyperenhancement. Precontrast T1 weighted images suggest the presence of a T1 hypointense enhancing metastasis occupying the posterior aspect of the sella and compressing the remainder of the gland the hypothalamus remains normal gland anteriorly (series 2, image 19). There is associated abnormal hypothalamic T2 and FLAIR hyperintensity compatible with edema (series 7, image 25). The cavernous sinus appears to remain normal. Small new 5 millimeter enhancing metastasis in the left inferior occipital lobe has developed since October with mild surrounding edema but no significant mass effect. See series 10, image 53. No other abnormal intracranial enhancement identified. No dural thickening. No ventriculomegaly or midline shift. No restricted diffusion or evidence of acute infarction. No acute intracranial hemorrhage identified. Stable gray and white matter signal elsewhere. Negative cervicomedullary junction. Vascular: Major intracranial vascular flow  voids are stable. Skull and upper cervical spine: Diffusely abnormal bone marrow signal in keeping with progressive osseous metastatic disease since July 2017. There is a chronic left temporal bone bone lesion near the semicircular canals (series 5, image 8) which has been stable since 2,016 and is favored to be benign. No definite abnormality of the visible cervical spinal cord. Sinuses/Orbits: Normal orbits soft tissues. Negative paranasal sinuses. Other: Visible internal auditory structures appear normal. Mastoids remain clear. No scalp or face soft tissue abnormality identified. IMPRESSION: 1. Progression of disease since 10/19/2016. 2. 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 Associated hypothalamic edema without significant regional mass effect. - 5 millimeter left inferior occipital lobe. Mild edema with no mass effect. 3. Enlargement of the treated right inferior parietal lobe metastasis from 4 mm now to 12-13 mm with new confluent vasogenic edema and mild regional mass effect. However, the appearance is complicated by evidence of interval hemorrhage within the lesion. Still in light of #2, the appearance is suspicious for true progression. 4. Progressive osseous metastatic disease since July 2018. It is possible that the pituitary involvement is via contiguous spread from the skull base. 5. The treated left periatrial lesion remains stable. Electronically Signed   By: Genevie Ann M.D.   On: 01/25/2017 11:18    [facility] Clinician Interpretation: I have personally reviewed the radiological images as listed.  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 Nolley has a complex CNS history and an array of neurologic deficits of unclear chronicity.  She is frankly myelopathic, although this was described as recently  as 2016 following decompressive laminectomy for a compressive metastatic lesion seen at that time.    The recent progression of her previously treated right parietal lesion is concerning and should be followed with MRI in 2-3 months to clarify radiation necrosis vs organic tumor growth.  Her most recently discovered lesions were treated this morning by Dr. Lisbeth Renshaw [facility]).     Her involuntary leg movements are either focal seizures or on the clonus/myoclonus spectrum.  We will pursue a trial of Keppra (557m BID) and assess the clinical impact, if any.  The dose can be titrated to as high as  1545m BID.  For now, we will hold off on starting steroids if she able to maintain control with AED alone.  We spent twenty additional minutes teaching regarding the natural history, biology, and historical experience in the treatment of neurologic complications of cancer. We also provided teaching sheets for the patient to take home as an additional resource.  We appreciate the opportunity to participate in the care of Carolyn [name]  She should return to clinic in 2-3 months with an MRI brain for review.  All questions were answered. The patient knows to call the clinic with any problems, questions or concerns. No barriers to learning were detected.  The total time spent in the encounter was 45 minutes and more than 50% was on counseling and review of test results   ZVentura Sellers MD Medical Director of Neuro-Oncology [facility]at [place]01/30/19 9:53 AM

## 2017-02-07 NOTE — Progress Notes (Signed)
Pt admitted to [facility] for transfusion of 2 units PRBC. Pt received transfusion without complication. Alert, oriented and ambulatory at time of discharge.  Coolidge Breeze, RN 02/07/2017

## 2017-02-07 NOTE — Progress Notes (Signed)
  Name: Carolyn [name]  MRN: [id number]  Date: 02/07/2017   DOB: February 05, 1970  Stereotactic Radiosurgery Operative Note  PRE-OPERATIVE DIAGNOSIS:  Multiple Brain Metastases  POST-OPERATIVE DIAGNOSIS:  Multiple Brain Metastases  PROCEDURE:  Stereotactic Radiosurgery  SURGEON:  Earleen Newport, MD  NARRATIVE: The patient underwent a radiation treatment planning session in the radiation oncology simulation suite under the care of the radiation oncology physician and physicist.  I participated closely in the radiation treatment planning afterwards. The patient underwent planning CT which was fused to 3T high resolution MRI with 1 mm axial slices.  These images were fused on the planning system.  We contoured the gross target volumes and subsequently expanded this to yield the Planning Target Volume. I actively participated in the planning process.  I helped to define and review the target contours and also the contours of the optic pathway, eyes, brainstem and selected nearby organs at risk.  All the dose constraints for critical structures were reviewed and compared to AAPM Task Group 101.  The prescription dose conformity was reviewed.  I approved the plan electronically.    Accordingly, [name] was brought to the TrueBeam stereotactic radiation treatment linac and placed in the custom immobilization mask.  The patient was aligned according to the IR fiducial markers with BrainLab Exactrac, then orthogonal x-rays were used in ExacTrac with the 6DOF robotic table and the shifts were made to align the patient  [name] received stereotactic radiosurgery uneventfully.    Lesions treated:  2   Complex lesions treated:  1 (>3.5 cm, <83mm of optic path, or within the brainstem)   The detailed description of the procedure is recorded in the radiation oncology procedure note.  I was present for the duration of the procedure.  DISPOSITION:  Following delivery, the patient was transported to nursing  in stable condition and monitored for possible acute effects to be discharged to home in stable condition with follow-up in one month.  Earleen Newport, MD 02/07/2017 9:22 AM

## 2017-02-07 NOTE — Progress Notes (Signed)
[facility] Progress Note   ZYRIAH MASK [id number] 1970-06-08 47 y.o.  [name] is managed by Dr. [name]  Actively treated with chemotherapy: yes  Current Therapy: Doxil, Xgeva, and Aranesp  Assessment: Plan:    Anemia, unspecified type - Plan: Prepare RBC, Type and screen, Prepare RBC, DISCONTINUED: 0.9 %  sodium chloride infusion, CANCELED: Practitioner attestation of consent, CANCELED: Complete patient signature process for consent form, CANCELED: Care order/instruction, CANCELED: Transfuse RBC  Other pancytopenia ([place]) - Plan: CANCELED: Prepare Pheresed Platelets, CANCELED: Prepare Pheresed Platelets  Thrombocytopenia ([place]) - Plan: Care order/instruction, Transfuse platelet pheresis, diphenhydrAMINE (BENADRYL) capsule 25 mg, acetaminophen (TYLENOL) tablet 650 mg, heparin lock flush 100 unit/mL, sodium chloride flush (NS) 0.9 % injection 10 mL, 0.9 %  sodium chloride infusion  Seizure ([facility]) - Plan: levETIRAcetam (KEPPRA) 500 MG tablet   Anemia: Ms. Majid will receive 2 units of packed red blood cells tomorrow and the sickle clinic.  Thrombocytopenia.  Ms. Taite will receive a transfusion of 1 unit of platelets today.  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 Mickeal Skinner.  Dr. Mickeal Skinner recommends the patient on Keppra 500 mg p.o. every 12 hours.  Dr. Mickeal Skinner will see schedule the patient to be seen in his clinic.   Please see After Visit Summary for patient specific instructions.  Future Appointments  Date Time Provider [place]  02/14/2017 12:00 PM [facility] ONLY [facility] None  02/14/2017 12:30 PM Magrinat, Virgie Dad, MD [facility] None  02/14/2017  1:00 PM [facility] FLUSH NURSE 2 [facility] None  02/19/2017  2:00 PM [facility] LAB 4 [facility] None  02/19/2017  2:30 PM Magrinat, Virgie Dad, MD [facility] None  02/19/2017  3:30 PM [facility] B6 [facility] None  02/26/2017  9:30 AM  [facility] LAB 6 [facility] None  02/26/2017 10:00 AM Magrinat, Virgie Dad, MD [facility] None  02/26/2017 10:30 AM [facility] FLUSH NURSE 2 [facility] None  03/13/2017 10:30 AM Hayden Pedro, PA-C [facility] None  03/19/2017  2:00 PM [facility] LAB 4 [facility] None  03/19/2017  2:30 PM Magrinat, Virgie Dad, MD [facility] None  03/19/2017  3:30 PM [facility] B4 [facility] None  03/26/2017  9:30 AM [facility] LAB 5 [facility] None  03/26/2017 10:00 AM Magrinat, Virgie Dad, MD [facility] None    Orders Placed This Encounter  Procedures  . Prepare RBC  . Type and screen       Subjective:   Patient ID:  MARLOWE CINQUEMANI is a 47 y.o. (DOB 18-Oct-1970) female.  Chief Complaint:  Chief Complaint  Patient presents with  . Epistaxis    HPI DORRIE COCUZZA is a 47 year old female with a stage IV ER positive breast cancer.  Additionally the patient has a history of thrombocytopenia and anemia.  She is followed by Dr. [name] at and is currently being treated with Doxil, Xgeva, Aranesp, and transfusions as needed.  She had a brain MRI done on 01/25/2017 which showed progression of brain metastasis since 10/19/2016.  2 new brain metastases were noted in the pituitary and pituitary infundibulum.  There was associated hypothalamic edema without significant regional mass-effect.  There was a 5 mm left inferior occipital lobe mass with mild edema with no mass-effect.  There was enlargement of the treated right inferior parietal lobe metastasis which had increased in size from 4 mm to 12-13 mm with new vasogenic edema. It was reported that the appearance was complicated by the evidence of interval hemorrhage within the lesion.  The appearance was suspicious for a true progression.  Progressive osseous metastatic disease was noted.  Screen states that she is to see radiation therapy tomorrow for start of radiation therapy to her brain.  She states that she was placed on steroids when she was first  diagnosed with brain metastases.  She was not placed on steroids at this time.  She is not on any antiseizure medications.  Ms. Rylee presents to the office today for 1 unit of platelets.  Her CBC returned with a platelet count of 15, the remainder of her CBC showed a WBC of 1.8, ANC of 0.9, hemoglobin of 6.1, and a hematocrit of 18.2.  She reports having ongoing episodes of twitching and spasms in her right leg which have been long-standing in nature but have been worsening lately.  She reports that she cannot walk when she is having spasms in her leg.  She reports that it takes her breath away.  Her right leg feels heavy at times.  This happens episodically and is not exacerbated by any activity.  She reports that she feels the twitching and spasms in her leg all the way up into her lower abdomen.  She reports having a slight nosebleed even after having platelets on 01/29/2017.  She denies fevers, chills, or sweats.  Medications: I have reviewed the patient's current medications.  Allergies:  Allergies  Allergen Reactions  . Allegra [Fexofenadine] Hives    Abdomen only  . Cat Hair Extract Other (See Comments)  . Shellfish Allergy Hives    Abdomen only  . Glucosamine Rash    Past Medical History:  Diagnosis Date  . Breast cancer ([place]) 09/2011   invasive ductal carcinoma metastatic ca in 3/14 lymph nodes  . History of chemotherapy 06/27/11 -09/04/11   neoadjuvant  . Hx of radiation therapy 11/08/11 -12/26/11   right chest wall/supraclav fossa, right scar  . S/P radiation therapy 05/08/14   SRS brain    Past Surgical History:  Procedure Laterality Date  . brain surgery-LITT Left 11/20/14   LITT procedure for Lt brain met.  Marland Kitchen BREAST BIOPSY     Left  . CHOLECYSTECTOMY N/A 03/01/2014   Procedure: LAPAROSCOPIC CHOLECYSTECTOMY;  Surgeon: Leighton Ruff, MD;  Location: [facility];  Service: General;  Laterality: N/A;  . LAMINECTOMY N/A 04/13/2014   Procedure: THORACIC LAMINECTOMY WITH FIXATION  THORACIC SIX-THORACIC TEN FUSION;  Surgeon: Kristeen Miss, MD;  Location: [place] [facility];  Service: Neurosurgery;  Laterality: N/A;  . MODIFIED MASTECTOMY  10/03/2011   Procedure: MODIFIED MASTECTOMY;  Surgeon: Haywood Lasso, MD;  Location: [place] [facility];  Service: General;  Laterality: Right;  . PORT-A-CATH REMOVAL  01/30/2012   Procedure: MINOR REMOVAL PORT-A-CATH;  Surgeon: Haywood Lasso, MD;  Location: [place];  Service: General;  Laterality: Left;  . PORTACATH PLACEMENT  06/20/2011   Procedure: INSERTION PORT-A-CATH;  Surgeon: Haywood Lasso, MD;  Location: [place];  Service: General;  Laterality: N/A;  . ROBOTIC ASSISTED TOTAL HYSTERECTOMY WITH BILATERAL SALPINGO OOPHERECTOMY Bilateral 12/23/2012   Procedure: ROBOTIC ASSISTED TOTAL HYSTERECTOMY WITH BILATERAL SALPINGO OOPHORECTOMY;  Surgeon: Marvene Staff, MD;  Location: [facility];  Service: Gynecology;  Laterality: Bilateral;  . WISDOM TOOTH EXTRACTION      Family History  Problem Relation Age of Onset  . Breast cancer Maternal Aunt 41  . Pancreatic cancer Maternal Grandfather   . Pancreatic cancer Maternal Aunt        died in her 60s  .  Leukemia Maternal Aunt        died in her 22s  . Ovarian cancer Cousin 51       maternal cousin    Social History   Socioeconomic History  . Marital status: Single    Spouse name: Not on file  . Number of children: Not on file  . Years of education: Not on file  . Highest education level: Not on file  Social Needs  . Financial resource strain: Not on file  . Food insecurity - worry: Not on file  . Food insecurity - inability: Not on file  . Transportation needs - medical: Not on file  . Transportation needs - non-medical: Not on file  Occupational History  . Not on file  Tobacco Use  . Smoking status: Never Smoker  . Smokeless tobacco: Never Used  Substance and Sexual Activity  . Alcohol use: No  . Drug use: No  . Sexual activity: Not  Currently    Birth control/protection: Condom  Other Topics Concern  . Not on file  Social History Narrative  . Not on file    Past Medical History, Surgical history, Social history, and Family history were reviewed and updated as appropriate.   Please see review of systems for further details on the patient's review from today.   Review of Systems:  Review of Systems  Constitutional: Negative for chills, diaphoresis and fever.  HENT: Negative for trouble swallowing.   Eyes: Negative for visual disturbance.  Respiratory: Negative for cough, chest tightness and shortness of breath.   Cardiovascular: Negative for chest pain and palpitations.  Musculoskeletal: Negative for arthralgias and myalgias.  Neurological: Positive for tremors (Episodic twitching of the right leg which is ongoing but has worsened recently.).    Objective:   Physical Exam:  BP (!) 101/53 (BP Location: Left Arm, Patient Position: Sitting)   Pulse 99   Temp 98.3 F (36.8 C) (Oral)   Resp 18   Ht '5\' 8"'$  (1.727 m)   Wt 112 lb 8 oz (51 kg)   LMP 07/05/2011 (Exact Date)   SpO2 98%   BMI 17.11 kg/m  ECOG: 2  Physical Exam  Constitutional: No distress.  The patient is an adult female who appears to be in no acute distress.  HENT:  Head: Normocephalic and atraumatic.  Cardiovascular: Normal rate, regular rhythm and normal heart sounds. Exam reveals no gallop and no friction rub.  No murmur heard. Pulmonary/Chest: Effort normal and breath sounds normal. No respiratory distress. She has no wheezes. She has no rales.  Neurological: She is alert. Coordination (Gait is unsteady.) abnormal.  Skin: Skin is warm and dry. She is not diaphoretic.  Psychiatric: She has a normal mood and affect. Her behavior is normal. Judgment and thought content normal.    Lab Review:     Component Value Date/Time   NA 137 02/06/2017 1328   NA 141 01/08/2017 0842   K 4.7 02/06/2017 1328   K 4.5 01/08/2017 0842   CL 109  02/06/2017 1328   CL 102 01/16/2012 0804   CO2 18 (L) 02/06/2017 1328   CO2 25 01/08/2017 0842   GLUCOSE 76 02/06/2017 1328   GLUCOSE 109 01/08/2017 0842   GLUCOSE 92 01/16/2012 0804   BUN 30 (H) 02/06/2017 1328   BUN 26.9 (H) 01/08/2017 0842   CREATININE 1.31 (H) 01/29/2017 0905   CREATININE 1.2 (H) 01/08/2017 0842   CALCIUM 8.8 02/06/2017 1328   CALCIUM 9.4 01/08/2017 0842   PROT  6.6 02/06/2017 1328   PROT 7.8 01/08/2017 0842   ALBUMIN 3.1 (L) 02/06/2017 1328   ALBUMIN 3.5 01/08/2017 0842   AST 55 (H) 02/06/2017 1328   AST 39 (H) 01/08/2017 0842   ALT 39 02/06/2017 1328   ALT 23 01/08/2017 0842   ALKPHOS 168 (H) 02/06/2017 1328   ALKPHOS 185 (H) 01/08/2017 0842   BILITOT 0.7 02/06/2017 1328   BILITOT 0.33 01/08/2017 0842   GFRNONAA 48 (L) 02/06/2017 1328   GFRAA 56 (L) 02/06/2017 1328       Component Value Date/Time   WBC 1.8 (L) 02/06/2017 1328   WBC 3.4 (L) 01/29/2017 0905   RBC 2.10 (L) 02/06/2017 1328   HGB 8.8 (L) 01/29/2017 0905   HGB 8.7 (L) 01/08/2017 0842   HCT 18.2 (L) 02/06/2017 1328   HCT 26.5 (L) 01/08/2017 0842   PLT 15 (L) 02/06/2017 1328   PLT 73 (L) 01/08/2017 0842   MCV 86.7 02/06/2017 1328   MCV 87.2 01/08/2017 0842   MCH 29.3 02/06/2017 1328   MCHC 33.8 02/06/2017 1328   RDW 14.9 02/06/2017 1328   RDW 15.0 (H) 01/08/2017 0842   LYMPHSABS 0.8 (L) 02/06/2017 1328   LYMPHSABS 0.7 (L) 01/08/2017 0842   MONOABS 0.1 02/06/2017 1328   MONOABS 0.1 01/08/2017 0842   EOSABS 0.0 02/06/2017 1328   EOSABS 0.0 01/08/2017 0842   BASOSABS 0.0 02/06/2017 1328   BASOSABS 0.0 01/08/2017 0842   -------------------------------  Imaging from last 24 hours (if applicable):  Radiology interpretation: Mr Jeri Cos Wo Contrast  Result Date: 01/25/2017 CLINICAL DATA:  47 year old female with metastatic breast cancer. Completed stereotactic radiosurgery to a left parietal lesion on 04/29/2014 to a dose of 20 gray, followed by auto-litt procedure (laser ablation)  November 2016. Small right parietal lobe metastasis discovered on MRI in November 2017 status post SRS on 12/1415 to a dose of 20 Gy. Osseous metastatic disease. EXAM: MRI HEAD WITHOUT AND WITH CONTRAST TECHNIQUE: Multiplanar, multiecho pulse sequences of the brain and surrounding structures were obtained without and with intravenous contrast. CONTRAST:  27m MULTIHANCE GADOBENATE DIMEGLUMINE 529 MG/ML IV SOLN COMPARISON:  10/19/2016 and earlier. FINDINGS: Brain: Stable mixed T1 signal, dark T2 signal left periatrial treated lesion (series 5, image 17 and series 10, image 100) with only faint adjacent FLAIR hyperintensity and no regional mass effect. Enlargement of the treated right inferior parietal metastasis which has increased from 4 millimeters to 12-13 millimeters largest dimension as seen on series 10, image 88. Furthermore, confluent surrounding T2 and FLAIR hyperintensity has developed since October with mild regional mass effect suggesting vasogenic edema. See series 10, image 88 and series 7, image 29. Furthermore, the lesion now demonstrates confluent hemosiderin on T2* imaging (series 6, image 16) which is new since July 2018, and only punctate blood products were evident in October. Subsequently, the enhancing portion of the lesion is almost completely dark on T2 weighted imaging (series 5, image 16). Furthermore, the pituitary and pituitary infundibulum have become abnormal, and are now thickened and lobulated with hyperenhancement. Precontrast T1 weighted images suggest the presence of a T1 hypointense enhancing metastasis occupying the posterior aspect of the sella and compressing the remainder of the gland the hypothalamus remains normal gland anteriorly (series 2, image 19). There is associated abnormal hypothalamic T2 and FLAIR hyperintensity compatible with edema (series 7, image 25). The cavernous sinus appears to remain normal. Small new 5 millimeter enhancing metastasis in the left inferior  occipital lobe has developed since October with  mild surrounding edema but no significant mass effect. See series 10, image 53. No other abnormal intracranial enhancement identified. No dural thickening. No ventriculomegaly or midline shift. No restricted diffusion or evidence of acute infarction. No acute intracranial hemorrhage identified. Stable gray and white matter signal elsewhere. Negative cervicomedullary junction. Vascular: Major intracranial vascular flow voids are stable. Skull and upper cervical spine: Diffusely abnormal bone marrow signal in keeping with progressive osseous metastatic disease since July 2017. There is a chronic left temporal bone bone lesion near the semicircular canals (series 5, image 8) which has been stable since 2,016 and is favored to be benign. No definite abnormality of the visible cervical spinal cord. Sinuses/Orbits: Normal orbits soft tissues. Negative paranasal sinuses. Other: Visible internal auditory structures appear normal. Mastoids remain clear. No scalp or face soft tissue abnormality identified. IMPRESSION: 1. Progression of disease since 10/19/2016. 2. 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 Associated hypothalamic edema without significant regional mass effect. - 5 millimeter left inferior occipital lobe. Mild edema with no mass effect. 3. Enlargement of the treated right inferior parietal lobe metastasis from 4 mm now to 12-13 mm with new confluent vasogenic edema and mild regional mass effect. However, the appearance is complicated by evidence of interval hemorrhage within the lesion. Still in light of #2, the appearance is suspicious for true progression. 4. Progressive osseous metastatic disease since July 2018. It is possible that the pituitary involvement is via contiguous spread from the skull base. 5. The treated left periatrial lesion remains stable. Electronically Signed   By: Genevie Ann M.D.   On: 01/25/2017 11:18        This  case was discussed with Dr. [name]. He expressed his agreement with my management of this patient. This case was discussed Dr. Mickeal Skinner. He expressed agreement with my management of this patient.

## 2017-02-07 NOTE — Progress Notes (Signed)
  Radiation Oncology         [phone] ________________________________  Name: LINEA CALLES MRN: [id number]  Date: 02/07/2017  DOB: [birth year]   SPECIAL TREATMENT PROCEDURE   3D TREATMENT PLANNING AND DOSIMETRY: The patient's radiation plan was reviewed and approved by Dr. Ellene Route from neurosurgery and radiation oncology prior to treatment. It showed 3-dimensional radiation distributions overlaid onto the planning CT/MRI image set. The [facility] for the target structures as well as the organs at risk were reviewed. The documentation of the 3D plan and dosimetry are filed in the radiation oncology EMR.   NARRATIVE: The patient was brought to the TrueBeam stereotactic radiation treatment machine and placed supine on the CT couch. The head frame was applied, and the patient was set up for stereotactic radiosurgery. Neurosurgery was present for the set-up and delivery   SIMULATION VERIFICATION: In the couch zero-angle position, the patient underwent Exactrac imaging using the Brainlab system with orthogonal KV images. These were carefully aligned and repeated to confirm treatment position for each of the isocenters. The Exactrac snap film verification was repeated at each couch angle.   SPECIAL TREATMENT PROCEDURE: The patient received stereotactic radiosurgery to the following targets:  1.  PTV3 target was treated using 3 Arcs to a prescription dose of 20 Gy. ExacTrac Snap verification was performed for each couch angle.  2.  PTV4 target was treated using 6 Arcs to a prescription dose of 14 Gy. ExacTrac Snap verification was performed for each couch angle.  STEREOTACTIC TREATMENT MANAGEMENT: Following delivery, the patient was transported to nursing in stable condition and monitored for possible acute effects. Vital signs were recorded . The patient tolerated treatment without significant acute effects, and was discharged to home in stable condition.  PLAN: Follow-up in one  month.   ------------------------------------------------  [name], MD, PhD

## 2017-02-08 ENCOUNTER — Inpatient Hospital Stay (HOSPITAL_COMMUNITY)
Admission: EM | Admit: 2017-02-08 | Discharge: 2017-03-09 | DRG: 193 | Disposition: E | Payer: BC Managed Care – PPO | Attending: Family Medicine | Admitting: Family Medicine

## 2017-02-08 ENCOUNTER — Encounter (HOSPITAL_COMMUNITY): Payer: Self-pay | Admitting: Emergency Medicine

## 2017-02-08 ENCOUNTER — Other Ambulatory Visit: Payer: Self-pay

## 2017-02-08 ENCOUNTER — Emergency Department (HOSPITAL_COMMUNITY): Payer: BC Managed Care – PPO

## 2017-02-08 DIAGNOSIS — C50919 Malignant neoplasm of unspecified site of unspecified female breast: Secondary | ICD-10-CM | POA: Diagnosis not present

## 2017-02-08 DIAGNOSIS — Z9049 Acquired absence of other specified parts of digestive tract: Secondary | ICD-10-CM | POA: Diagnosis not present

## 2017-02-08 DIAGNOSIS — R6521 Severe sepsis with septic shock: Secondary | ICD-10-CM | POA: Diagnosis not present

## 2017-02-08 DIAGNOSIS — Z681 Body mass index (BMI) 19 or less, adult: Secondary | ICD-10-CM | POA: Diagnosis not present

## 2017-02-08 DIAGNOSIS — Z803 Family history of malignant neoplasm of breast: Secondary | ICD-10-CM

## 2017-02-08 DIAGNOSIS — Z981 Arthrodesis status: Secondary | ICD-10-CM | POA: Diagnosis not present

## 2017-02-08 DIAGNOSIS — C787 Secondary malignant neoplasm of liver and intrahepatic bile duct: Secondary | ICD-10-CM | POA: Diagnosis present

## 2017-02-08 DIAGNOSIS — Z9221 Personal history of antineoplastic chemotherapy: Secondary | ICD-10-CM | POA: Diagnosis not present

## 2017-02-08 DIAGNOSIS — R402142 Coma scale, eyes open, spontaneous, at arrival to emergency department: Secondary | ICD-10-CM | POA: Diagnosis present

## 2017-02-08 DIAGNOSIS — C7951 Secondary malignant neoplasm of bone: Secondary | ICD-10-CM | POA: Diagnosis present

## 2017-02-08 DIAGNOSIS — D61818 Other pancytopenia: Secondary | ICD-10-CM | POA: Diagnosis not present

## 2017-02-08 DIAGNOSIS — G959 Disease of spinal cord, unspecified: Secondary | ICD-10-CM | POA: Diagnosis present

## 2017-02-08 DIAGNOSIS — E46 Unspecified protein-calorie malnutrition: Secondary | ICD-10-CM | POA: Diagnosis not present

## 2017-02-08 DIAGNOSIS — C50911 Malignant neoplasm of unspecified site of right female breast: Secondary | ICD-10-CM | POA: Diagnosis present

## 2017-02-08 DIAGNOSIS — Z7189 Other specified counseling: Secondary | ICD-10-CM | POA: Diagnosis not present

## 2017-02-08 DIAGNOSIS — Z90710 Acquired absence of both cervix and uterus: Secondary | ICD-10-CM

## 2017-02-08 DIAGNOSIS — R402362 Coma scale, best motor response, obeys commands, at arrival to emergency department: Secondary | ICD-10-CM | POA: Diagnosis present

## 2017-02-08 DIAGNOSIS — R569 Unspecified convulsions: Secondary | ICD-10-CM

## 2017-02-08 DIAGNOSIS — D61810 Antineoplastic chemotherapy induced pancytopenia: Secondary | ICD-10-CM | POA: Diagnosis present

## 2017-02-08 DIAGNOSIS — C7931 Secondary malignant neoplasm of brain: Secondary | ICD-10-CM | POA: Diagnosis present

## 2017-02-08 DIAGNOSIS — Z923 Personal history of irradiation: Secondary | ICD-10-CM

## 2017-02-08 DIAGNOSIS — Z515 Encounter for palliative care: Secondary | ICD-10-CM | POA: Diagnosis not present

## 2017-02-08 DIAGNOSIS — Z66 Do not resuscitate: Secondary | ICD-10-CM | POA: Diagnosis present

## 2017-02-08 DIAGNOSIS — E43 Unspecified severe protein-calorie malnutrition: Secondary | ICD-10-CM | POA: Diagnosis present

## 2017-02-08 DIAGNOSIS — Z9011 Acquired absence of right breast and nipple: Secondary | ICD-10-CM | POA: Diagnosis not present

## 2017-02-08 DIAGNOSIS — Z806 Family history of leukemia: Secondary | ICD-10-CM

## 2017-02-08 DIAGNOSIS — Z800 Family history of malignant neoplasm of digestive organs: Secondary | ICD-10-CM | POA: Diagnosis not present

## 2017-02-08 DIAGNOSIS — I959 Hypotension, unspecified: Secondary | ICD-10-CM | POA: Diagnosis not present

## 2017-02-08 DIAGNOSIS — Z91013 Allergy to seafood: Secondary | ICD-10-CM

## 2017-02-08 DIAGNOSIS — J189 Pneumonia, unspecified organism: Secondary | ICD-10-CM | POA: Diagnosis present

## 2017-02-08 DIAGNOSIS — C773 Secondary and unspecified malignant neoplasm of axilla and upper limb lymph nodes: Secondary | ICD-10-CM | POA: Diagnosis present

## 2017-02-08 DIAGNOSIS — T451X5A Adverse effect of antineoplastic and immunosuppressive drugs, initial encounter: Secondary | ICD-10-CM | POA: Diagnosis present

## 2017-02-08 DIAGNOSIS — Z170 Estrogen receptor positive status [ER+]: Secondary | ICD-10-CM | POA: Diagnosis not present

## 2017-02-08 DIAGNOSIS — D899 Disorder involving the immune mechanism, unspecified: Secondary | ICD-10-CM | POA: Diagnosis present

## 2017-02-08 DIAGNOSIS — C50411 Malignant neoplasm of upper-outer quadrant of right female breast: Secondary | ICD-10-CM | POA: Diagnosis not present

## 2017-02-08 DIAGNOSIS — A419 Sepsis, unspecified organism: Secondary | ICD-10-CM | POA: Diagnosis not present

## 2017-02-08 DIAGNOSIS — Z888 Allergy status to other drugs, medicaments and biological substances status: Secondary | ICD-10-CM

## 2017-02-08 DIAGNOSIS — Z91048 Other nonmedicinal substance allergy status: Secondary | ICD-10-CM

## 2017-02-08 DIAGNOSIS — R0602 Shortness of breath: Secondary | ICD-10-CM | POA: Diagnosis present

## 2017-02-08 DIAGNOSIS — G4089 Other seizures: Secondary | ICD-10-CM | POA: Diagnosis present

## 2017-02-08 DIAGNOSIS — J9690 Respiratory failure, unspecified, unspecified whether with hypoxia or hypercapnia: Secondary | ICD-10-CM | POA: Diagnosis present

## 2017-02-08 DIAGNOSIS — G893 Neoplasm related pain (acute) (chronic): Secondary | ICD-10-CM | POA: Diagnosis present

## 2017-02-08 DIAGNOSIS — Z8041 Family history of malignant neoplasm of ovary: Secondary | ICD-10-CM

## 2017-02-08 DIAGNOSIS — R402252 Coma scale, best verbal response, oriented, at arrival to emergency department: Secondary | ICD-10-CM | POA: Diagnosis present

## 2017-02-08 DIAGNOSIS — R05 Cough: Secondary | ICD-10-CM | POA: Diagnosis not present

## 2017-02-08 LAB — TYPE AND SCREEN
ABO/RH(D): B POS
Antibody Screen: NEGATIVE
UNIT DIVISION: 0
Unit division: 0

## 2017-02-08 LAB — URINALYSIS, ROUTINE W REFLEX MICROSCOPIC
BACTERIA UA: NONE SEEN
BILIRUBIN URINE: NEGATIVE
GLUCOSE, UA: NEGATIVE mg/dL
KETONES UR: NEGATIVE mg/dL
NITRITE: NEGATIVE
PH: 5 (ref 5.0–8.0)
PROTEIN: NEGATIVE mg/dL
Specific Gravity, Urine: 1.015 (ref 1.005–1.030)

## 2017-02-08 LAB — COMPREHENSIVE METABOLIC PANEL
ALBUMIN: 3.3 g/dL — AB (ref 3.5–5.0)
ALK PHOS: 167 U/L — AB (ref 38–126)
ALT: 34 U/L (ref 14–54)
ANION GAP: 8 (ref 5–15)
AST: 58 U/L — ABNORMAL HIGH (ref 15–41)
BUN: 19 mg/dL (ref 6–20)
CALCIUM: 8.2 mg/dL — AB (ref 8.9–10.3)
CO2: 19 mmol/L — AB (ref 22–32)
Chloride: 112 mmol/L — ABNORMAL HIGH (ref 101–111)
Creatinine, Ser: 1.17 mg/dL — ABNORMAL HIGH (ref 0.44–1.00)
GFR calc non Af Amer: 55 mL/min — ABNORMAL LOW (ref >60)
Glucose, Bld: 82 mg/dL (ref 65–99)
POTASSIUM: 4.8 mmol/L (ref 3.5–5.1)
Sodium: 139 mmol/L (ref 135–145)
Total Bilirubin: 0.5 mg/dL (ref 0.3–1.2)
Total Protein: 7.2 g/dL (ref 6.5–8.1)

## 2017-02-08 LAB — CBC WITH DIFFERENTIAL/PLATELET
BASOS ABS: 0 10*3/uL (ref 0.0–0.1)
BASOS PCT: 2 %
EOS ABS: 0 10*3/uL (ref 0.0–0.7)
EOS PCT: 2 %
HCT: 31.6 % — ABNORMAL LOW (ref 36.0–46.0)
Hemoglobin: 10.9 g/dL — ABNORMAL LOW (ref 12.0–15.0)
Lymphocytes Relative: 43 %
Lymphs Abs: 0.9 10*3/uL (ref 0.7–4.0)
MCH: 29.1 pg (ref 26.0–34.0)
MCHC: 34.5 g/dL (ref 30.0–36.0)
MCV: 84.3 fL (ref 78.0–100.0)
MONOS PCT: 9 %
Monocytes Absolute: 0.2 10*3/uL (ref 0.1–1.0)
Neutro Abs: 0.9 10*3/uL (ref 1.7–7.7)
Neutrophils Relative %: 44 %
PLATELETS: 29 10*3/uL — AB (ref 150–400)
RBC: 3.75 MIL/uL — ABNORMAL LOW (ref 3.87–5.11)
RDW: 15.4 % (ref 11.5–15.5)
WBC: 2 10*3/uL — ABNORMAL LOW (ref 4.0–10.5)

## 2017-02-08 LAB — BPAM RBC
Blood Product Expiration Date: 201902222359
Blood Product Expiration Date: 201902222359
ISSUE DATE / TIME: 201901301129
ISSUE DATE / TIME: 201901301129
UNIT TYPE AND RH: 7300
Unit Type and Rh: 7300

## 2017-02-08 LAB — I-STAT CG4 LACTIC ACID, ED: Lactic Acid, Venous: 1.45 mmol/L (ref 0.5–1.9)

## 2017-02-08 LAB — INFLUENZA PANEL BY PCR (TYPE A & B)
INFLAPCR: NEGATIVE
INFLBPCR: NEGATIVE

## 2017-02-08 MED ORDER — ONDANSETRON HCL 4 MG PO TABS: 4.0000 mg | ORAL_TABLET | Freq: Four times a day (QID) | ORAL | Status: DC | PRN

## 2017-02-08 MED ORDER — ACETAMINOPHEN 325 MG PO TABS
650.0000 mg | ORAL_TABLET | Freq: Four times a day (QID) | ORAL | Status: DC | PRN
  Administered 2017-02-09: 650 mg via ORAL
  Filled 2017-02-08: qty 2

## 2017-02-08 MED ORDER — SODIUM CHLORIDE 0.9 % IV BOLUS (SEPSIS)
250.0000 mL | Freq: Once | INTRAVENOUS | Status: AC
  Administered 2017-02-08: 250 mL via INTRAVENOUS

## 2017-02-08 MED ORDER — SODIUM CHLORIDE 0.9 % IV BOLUS (SEPSIS)
1000.0000 mL | Freq: Once | INTRAVENOUS | Status: AC
  Administered 2017-02-08: 1000 mL via INTRAVENOUS

## 2017-02-08 MED ORDER — ACETAMINOPHEN 650 MG RE SUPP: 650.0000 mg | Freq: Four times a day (QID) | RECTAL | Status: DC | PRN

## 2017-02-08 MED ORDER — SODIUM CHLORIDE 0.9 % IV BOLUS (SEPSIS)
500.0000 mL | Freq: Once | INTRAVENOUS | Status: AC
  Administered 2017-02-08: 500 mL via INTRAVENOUS

## 2017-02-08 MED ORDER — ONDANSETRON HCL 4 MG/2ML IJ SOLN: 4.0000 mg | Freq: Four times a day (QID) | INTRAMUSCULAR | Status: DC | PRN

## 2017-02-08 MED ORDER — SODIUM CHLORIDE 0.9 % IV SOLN
INTRAVENOUS | Status: DC
  Administered 2017-02-09: 01:00:00 via INTRAVENOUS

## 2017-02-08 MED ORDER — POLYETHYLENE GLYCOL 3350 17 G PO PACK
17.0000 g | PACK | Freq: Every day | ORAL | Status: DC
  Administered 2017-02-09: 17 g via ORAL
  Filled 2017-02-08 (×2): qty 1

## 2017-02-08 MED ORDER — GABAPENTIN 300 MG PO CAPS
300.0000 mg | ORAL_CAPSULE | Freq: Every day | ORAL | Status: DC
  Administered 2017-02-08 – 2017-02-09 (×2): 300 mg via ORAL
  Filled 2017-02-08 (×2): qty 1

## 2017-02-08 MED ORDER — SODIUM CHLORIDE 0.9 % IV SOLN
INTRAVENOUS | Status: DC
  Administered 2017-02-08: 20:00:00 via INTRAVENOUS

## 2017-02-08 MED ORDER — CEFEPIME HCL 2 G IJ SOLR
2.0000 g | Freq: Once | INTRAMUSCULAR | Status: AC
  Administered 2017-02-08: 2 g via INTRAVENOUS
  Filled 2017-02-08: qty 2

## 2017-02-08 MED ORDER — SODIUM CHLORIDE 0.9 % IV SOLN
1000.0000 mL | INTRAVENOUS | Status: DC
  Administered 2017-02-08: 1000 mL via INTRAVENOUS

## 2017-02-08 MED ORDER — DEXTROSE 5 % IV SOLN
1.0000 g | Freq: Two times a day (BID) | INTRAVENOUS | Status: DC
  Administered 2017-02-09 – 2017-02-10 (×3): 1 g via INTRAVENOUS
  Filled 2017-02-08 (×8): qty 1

## 2017-02-08 MED ORDER — LEVETIRACETAM 500 MG PO TABS
500.0000 mg | ORAL_TABLET | Freq: Two times a day (BID) | ORAL | Status: DC
  Administered 2017-02-08 – 2017-02-09 (×3): 500 mg via ORAL
  Filled 2017-02-08 (×3): qty 1

## 2017-02-08 MED ORDER — ADULT MULTIVITAMIN W/MINERALS CH
1.0000 | ORAL_TABLET | Freq: Every day | ORAL | Status: DC
  Administered 2017-02-09: 1 via ORAL
  Filled 2017-02-08 (×2): qty 1

## 2017-02-08 MED ORDER — VANCOMYCIN HCL IN DEXTROSE 1-5 GM/200ML-% IV SOLN
1000.0000 mg | Freq: Once | INTRAVENOUS | Status: AC
  Administered 2017-02-08: 1000 mg via INTRAVENOUS
  Filled 2017-02-08: qty 200

## 2017-02-08 MED ORDER — DEXTROSE 5 % IV SOLN
500.0000 mg | Freq: Every day | INTRAVENOUS | Status: DC
  Administered 2017-02-08 – 2017-02-09 (×2): 500 mg via INTRAVENOUS
  Filled 2017-02-08 (×2): qty 500

## 2017-02-08 NOTE — Progress Notes (Signed)
A consult was received from an ED physician for vancomycin and cefepime per pharmacy dosing.  The patient's profile has been reviewed for ht/wt/allergies/indication/available labs.   A one time order has been placed for vancomycin 1g and cefepime 2g.    Further antibiotics/pharmacy consults should be ordered by admitting physician if indicated.                       Thank you, Peggyann Juba, PharmD, BCPS 01/15/2017  9:01 PM

## 2017-02-08 NOTE — ED Provider Notes (Signed)
[facility] DEPT Provider Note   CSN: [id number] Arrival date & time: 01/10/2017  1913     History   Chief Complaint Chief Complaint  Patient presents with  . Respiratory Distress  . Cough  . Spasms    HPI Carolyn [name] is a 47 y.o. female.  47 year old female with history of metastatic breast cancer who presents with sudden onset of coughing as well as right-sided chest discomfort.  Cough is been nonproductive and not associate with fever or chills.  She is currently undergoing chemotherapy as well as radiation therapy.  She does have a history of brain metastases as well 2.  Denies any fever, vomiting, diarrhea.  Has noted increased weakness.  Denies any leg swelling.  Symptoms persistent nothing makes him better or worse and no treatment used prior to arrival and no prior history of same.      Past Medical History:  Diagnosis Date  . Breast cancer ([place]) 09/2011   invasive ductal carcinoma metastatic ca in 3/14 lymph nodes  . History of chemotherapy 06/27/11 -09/04/11   neoadjuvant  . Hx of radiation therapy 11/08/11 -12/26/11   right chest wall/supraclav fossa, right scar  . S/P radiation therapy 05/08/14   SRS brain    Patient Active Problem List   Diagnosis Date Noted  . Brain metastasis ([place]) 02/07/2017  . 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 Palliative care by specialist   . DNR (do not resuscitate) discussion   . Neoplastic malignant related fatigue   . Pain from bone metastases ([place]) 01/22/2017  . Pancytopenia ([place]) 01/22/2017  . Protein-calorie malnutrition, severe ([place]) 01/22/2017  . AKI (acute kidney injury) ([place])   . Chest pain   . Hypercalcemia 09/05/2016  . ARF (acute renal failure) ([place]) 09/05/2016  . Bone metastases ([name]) 01/17/2016  . Genetic testing 10/15/2014  . Hypokalemia 06/17/2014  . Breast cancer metastasized to brain ([place])   . 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 Myelopathy ([place]) 04/12/2014  .  Pathologic fracture of thoracic vertebrae 04/12/2014  . Biliary colic 76/73/4193  . Malignant neoplasm of upper-outer quadrant of right breast in female, estrogen receptor positive ([place]) 09/24/2013  . S/P total hysterectomy and bilateral salpingo-oophorectomy 12/23/2012  . Metastatic breast cancer [facility])     Past Surgical History:  Procedure Laterality Date  . brain surgery-LITT Left 11/20/14   LITT procedure for Lt brain met.  Marland Kitchen BREAST BIOPSY     Left  . CHOLECYSTECTOMY N/A 03/01/2014   Procedure: LAPAROSCOPIC CHOLECYSTECTOMY;  Surgeon: Leighton Ruff, MD;  Location: [facility];  Service: General;  Laterality: N/A;  . LAMINECTOMY N/A 04/13/2014   Procedure: THORACIC LAMINECTOMY WITH FIXATION THORACIC SIX-THORACIC TEN FUSION;  Surgeon: Kristeen Miss, MD;  Location: [facility];  Service: Neurosurgery;  Laterality: N/A;  . MODIFIED MASTECTOMY  10/03/2011   Procedure: MODIFIED MASTECTOMY;  Surgeon: Haywood Lasso, MD;  Location: [facility];  Service: General;  Laterality: Right;  . PORT-A-CATH REMOVAL  01/30/2012   Procedure: MINOR REMOVAL PORT-A-CATH;  Surgeon: Haywood Lasso, MD;  Location: [facility];  Service: General;  Laterality: Left;  . PORTACATH PLACEMENT  06/20/2011   Procedure: INSERTION PORT-A-CATH;  Surgeon: Haywood Lasso, MD;  Location: [facility];  Service: General;  Laterality: N/A;  . ROBOTIC ASSISTED TOTAL HYSTERECTOMY WITH BILATERAL SALPINGO OOPHERECTOMY Bilateral 12/23/2012   Procedure: ROBOTIC ASSISTED TOTAL HYSTERECTOMY WITH BILATERAL SALPINGO OOPHORECTOMY;  Surgeon: Marvene Staff, MD;  Location: [facility];  Service:  Gynecology;  Laterality: Bilateral;  . WISDOM TOOTH EXTRACTION      OB History    No data available       Home Medications    Prior to Admission medications   Medication Sig Start Date End Date Taking? Authorizing Provider  Cholecalciferol (VITAMIN D3) 2000 units TABS Take by mouth daily.    [provider]    gabapentin (NEURONTIN) 100 MG capsule TAKE 1 TO 3 CAPSULES BY MOUTH AT BEDTIME FOR NEUROPATHY 01/05/17   Causey, Charlestine Massed, NP  levETIRAcetam (KEPPRA) 500 MG tablet Take 1 tablet (500 mg total) by mouth 2 (two) times daily. 02/06/17   Tanner, Lyndon Code., PA-C  LORazepam (ATIVAN) 0.5 MG tablet 1 tablet po 30 minutes prior to radiation or MRI 01/29/17   Hayden Pedro, PA-C  Multiple Vitamin (MULTIVITAMIN) tablet Take 1 tablet by mouth daily.    [provider]  ondansetron (ZOFRAN) 8 MG tablet Take 1 tablet (8 mg total) by mouth every 8 (eight) hours as needed for nausea or vomiting. 01/29/17   Hayden Pedro, PA-C  polyethylene glycol [facility] / GLYCOLAX) packet Take 17 g by mouth daily. 09/15/16   Georgette Shell, MD  prochlorperazine (COMPAZINE) 10 MG tablet Take 1 tablet (10 mg total) by mouth every 6 (six) hours as needed (Nausea or vomiting). 12/13/16   Magrinat, Virgie Dad, MD  traMADol (ULTRAM) 50 MG tablet Take 1 tablet (50 mg total) by mouth every 6 (six) hours as needed. 08/28/16   Magrinat, Virgie Dad, MD    Family History Family History  Problem Relation Age of Onset  . Breast cancer Maternal Aunt 81  . Pancreatic cancer Maternal Grandfather   . Pancreatic cancer Maternal Aunt        died in her 41s  . Leukemia Maternal Aunt        died in her 68s  . Ovarian cancer Cousin 38       maternal cousin    Social History Social History   Tobacco Use  . Smoking status: Never Smoker  . Smokeless tobacco: Never Used  Substance Use Topics  . Alcohol use: No  . Drug use: No     Allergies   Allegra [fexofenadine]; Cat hair extract; Shellfish allergy; and Glucosamine   Review of Systems Review of Systems  All other systems reviewed and are negative.    Physical Exam Updated Vital Signs LMP 07/05/2011 (Exact Date)   SpO2 98% Comment: RA  Physical Exam  Constitutional: She is oriented to person, place, and time. She appears cachectic.  Non-toxic  appearance. No distress.  HENT:  Head: Normocephalic and atraumatic.  Eyes: Conjunctivae, EOM and lids are normal. Pupils are equal, round, and reactive to light.  Neck: Normal range of motion. Neck supple. No tracheal deviation present. No thyroid mass present.  Cardiovascular: Regular rhythm and normal heart sounds. Tachycardia present. Exam reveals no gallop.  No murmur heard. Pulmonary/Chest: Effort normal. No stridor. No respiratory distress. She has decreased breath sounds in the right lower field and the left lower field. She has no wheezes. She has no rhonchi. She has no rales.  Abdominal: Soft. Normal appearance and bowel sounds are normal. She exhibits no distension. There is no tenderness. There is no rebound and no CVA tenderness.  Musculoskeletal: Normal range of motion. She exhibits no edema or tenderness.  Neurological: She is alert and oriented to person, place, and time. She has normal strength. No cranial nerve deficit or sensory deficit.  GCS eye subscore is 4. GCS verbal subscore is 5. GCS motor subscore is 6.  Skin: Skin is warm and dry. No abrasion and no rash noted.  Psychiatric: She has a normal mood and affect. Her speech is normal and behavior is normal.  Nursing note and vitals reviewed.    ED Treatments / Results  Labs (all labs ordered are listed, but only abnormal results are displayed) Labs Reviewed  URINE CULTURE  CBC WITH DIFFERENTIAL/PLATELET  COMPREHENSIVE METABOLIC PANEL  URINALYSIS, ROUTINE W REFLEX MICROSCOPIC    EKG  EKG Interpretation None       Radiology No results found.  Procedures Procedures (including critical care time)  Medications Ordered in ED Medications  sodium chloride 0.9 % bolus 1,000 mL (not administered)  0.9 %  sodium chloride infusion (not administered)     Initial Impression / Assessment and Plan / ED Course  I have reviewed the triage vital signs and the nursing notes.  Pertinent labs & imaging results that  were available during my care of the patient were reviewed by me and considered in my medical decision making (see chart for details).     Patient with evidence of pneumonia on chest x-rays.  Given IV fluids and started on IV antibiotics.  Will be admitted to the hospitalist service  Final Clinical Impressions(s) / ED Diagnoses   Final diagnoses:  SOB (shortness of breath)    ED Discharge Orders    None       Lacretia Leigh, MD 01/24/2017 2149

## 2017-02-08 NOTE — H&P (Signed)
History and Physical    Carolyn [name] XOV:[id number] DOB: [name] 27, 1970 DOA: 02/05/2017  PCP: Default, Provider, MD  Patient coming from: Home.  Chief Complaint: Cough and shortness of breath.  HPI: Carolyn [name] is a 47 y.o. female with history of metastatic breast cancer who presents to the ER with complaints of cough and shortness of breath.  Patient has been having the symptoms for last 2 days.  Denies any fever chills.  Cough is usually nonproductive unable to bring out anything.  Patient also was feeling fatigued.  Last chemotherapy as per the patient was 2 weeks ago.  ED Course: In the ER patient was mildly febrile labs revealed pancytopenia.  Chest x-ray was showing possible infiltrates concerning for pneumonia.  Patient is started on antibiotics after blood cultures obtained influenza PCR is pending.  Review of Systems: As per HPI, rest all negative.   Past Medical History:  Diagnosis Date  . Breast cancer ([place]) 09/2011   invasive ductal carcinoma metastatic ca in 3/14 lymph nodes  . History of chemotherapy 06/27/11 -09/04/11   neoadjuvant  . Hx of radiation therapy 11/08/11 -12/26/11   right chest wall/supraclav fossa, right scar  . S/P radiation therapy 05/08/14   SRS brain    Past Surgical History:  Procedure Laterality Date  . brain surgery-LITT Left 11/20/14   LITT procedure for Lt brain met.  Marland Kitchen BREAST BIOPSY     Left  . CHOLECYSTECTOMY N/A 03/01/2014   Procedure: LAPAROSCOPIC CHOLECYSTECTOMY;  Surgeon: Leighton Ruff, MD;  [facility];  Service: General;  Laterality: N/A;  . LAMINECTOMY N/A 04/13/2014   Procedure: THORACIC LAMINECTOMY WITH FIXATION THORACIC SIX-THORACIC TEN FUSION;  Surgeon: Kristeen Miss, MD;  Location: [facility];  Service: Neurosurgery;  Laterality: N/A;  . MODIFIED MASTECTOMY  10/03/2011   Procedure: MODIFIED MASTECTOMY;  Surgeon: Haywood Lasso, MD;  Location: [facility];  Service: General;  Laterality: Right;  . PORT-A-CATH  REMOVAL  01/30/2012   Procedure: MINOR REMOVAL PORT-A-CATH;  Surgeon: Haywood Lasso, MD;  Location: [facility];  Service: General;  Laterality: Left;  . PORTACATH PLACEMENT  06/20/2011   Procedure: INSERTION PORT-A-CATH;  Surgeon: Haywood Lasso, MD;  Location: [facility];  Service: General;  Laterality: N/A;  . ROBOTIC ASSISTED TOTAL HYSTERECTOMY WITH BILATERAL SALPINGO OOPHERECTOMY Bilateral 12/23/2012   Procedure: ROBOTIC ASSISTED TOTAL HYSTERECTOMY WITH BILATERAL SALPINGO OOPHORECTOMY;  Surgeon: Marvene Staff, MD;  Location: [facility];  Service: Gynecology;  Laterality: Bilateral;  . WISDOM TOOTH EXTRACTION       reports that  has never smoked. she has never used smokeless tobacco. She reports that she does not drink alcohol or use drugs.  Allergies  Allergen Reactions  . Allegra [Fexofenadine] Hives    Abdomen only  . Cat Hair Extract Other (See Comments)  . Shellfish Allergy Hives    Abdomen only  . Glucosamine Rash    Family History  Problem Relation Age of Onset  . Breast cancer Maternal Aunt 14  . Pancreatic cancer Maternal Grandfather   . Pancreatic cancer Maternal Aunt        died in her 61s  . Leukemia Maternal Aunt        died in her 8s  . Ovarian cancer Cousin 14       maternal cousin    Prior to Admission medications   Medication Sig Start Date End Date Taking? Authorizing Provider  Cholecalciferol (VITAMIN D3) 2000 units TABS Take by mouth  daily.   Yes [provider]  gabapentin (NEURONTIN) 100 MG capsule TAKE 1 TO 3 CAPSULES BY MOUTH AT BEDTIME FOR NEUROPATHY 01/05/17  Yes Causey, Charlestine Massed, NP  levETIRAcetam (KEPPRA) 500 MG tablet Take 1 tablet (500 mg total) by mouth 2 (two) times daily. 02/06/17  Yes Tanner, Lyndon Code., PA-C  LORazepam (ATIVAN) 0.5 MG tablet 1 tablet po 30 minutes prior to radiation or MRI 01/29/17  Yes Hayden Pedro, PA-C  Multiple Vitamin (MULTIVITAMIN) tablet Take 1 tablet by mouth daily.   Yes  [provider]  ondansetron (ZOFRAN) 8 MG tablet Take 1 tablet (8 mg total) by mouth every 8 (eight) hours as needed for nausea or vomiting. 01/29/17  Yes Hayden Pedro, PA-C  polyethylene glycol [facility] / GLYCOLAX) packet Take 17 g by mouth daily. 09/15/16  Yes Georgette Shell, MD  traMADol (ULTRAM) 50 MG tablet Take 1 tablet (50 mg total) by mouth every 6 (six) hours as needed. 08/28/16  Yes Magrinat, Virgie Dad, MD  prochlorperazine (COMPAZINE) 10 MG tablet Take 1 tablet (10 mg total) by mouth every 6 (six) hours as needed (Nausea or vomiting). 12/13/16   Magrinat, Virgie Dad, MD    Physical Exam: Vitals:   01/24/2017 1922 02/02/2017 1944 01/17/2017 1954 01/27/2017 1955  BP:  116/73 116/73   Pulse:  98    Resp:  (!) 21    Temp:   99.1 F (37.3 C)   TempSrc:   Oral   SpO2: 98% 99%    Weight:    50.8 kg (112 lb)  Height:    '5\' 7"'$  (1.702 m)      Constitutional: Moderately built and nourished. Vitals:   02/04/2017 1922 01/28/2017 1944 01/23/2017 1954 01/24/2017 1955  BP:  116/73 116/73   Pulse:  98    Resp:  (!) 21    Temp:   99.1 F (37.3 C)   TempSrc:   Oral   SpO2: 98% 99%    Weight:    50.8 kg (112 lb)  Height:    '5\' 7"'$  (1.702 m)   Eyes: Anicteric no pallor. ENMT: No discharge from the ears eyes nose or mouth. Neck: No mass felt.  No neck rigidity. Respiratory: No rhonchi or crepitations. Cardiovascular: S1-S2 heard no murmurs appreciated. Abdomen: Soft nontender bowel sounds present. Musculoskeletal: No edema.  No joint effusion.  No rash. Skin: Skin appears warm. Neurologic: Alert awake oriented to time place and person.  Moves all extremities. Psychiatric: Appears normal.  Normal affect.   Labs on Admission: I have personally reviewed following labs and imaging studies  CBC: Recent Labs  Lab 02/06/17 1328 01/12/2017 2020  WBC 1.8* 2.0*  NEUTROABS 0.9* 0.9  HGB  --  10.9*  HCT 18.2* 31.6*  MCV 86.7 84.3  PLT 15* 29*   Basic Metabolic Panel: Recent  Labs  Lab 02/06/17 1328 02/02/2017 2020  NA 137 139  K 4.7 4.8  CL 109 112*  CO2 18* 19*  GLUCOSE 76 82  BUN 30* 19  CREATININE  --  1.17*  CALCIUM 8.8 8.2*   GFR: Estimated Creatinine Clearance: 47.7 mL/min (A) (by C-G formula based on SCr of 1.17 mg/dL (H)). Liver Function Tests: Recent Labs  Lab 02/06/17 1328 01/22/2017 2020  AST 55* 58*  ALT 39 34  ALKPHOS 168* 167*  BILITOT 0.7 0.5  PROT 6.6 7.2  ALBUMIN 3.1* 3.3*   No results for input(s): LIPASE, AMYLASE in the last 168 hours. No results for  input(s): AMMONIA in the last 168 hours. Coagulation Profile: No results for input(s): INR, PROTIME in the last 168 hours. Cardiac Enzymes: No results for input(s): CKTOTAL, CKMB, CKMBINDEX, TROPONINI in the last 168 hours. BNP (last 3 results) No results for input(s): PROBNP in the last 8760 hours. HbA1C: No results for input(s): HGBA1C in the last 72 hours. CBG: No results for input(s): GLUCAP in the last 168 hours. Lipid Profile: No results for input(s): CHOL, HDL, LDLCALC, TRIG, CHOLHDL, LDLDIRECT in the last 72 hours. Thyroid Function Tests: No results for input(s): TSH, T4TOTAL, FREET4, T3FREE, THYROIDAB in the last 72 hours. Anemia Panel: No results for input(s): VITAMINB12, FOLATE, FERRITIN, TIBC, IRON, RETICCTPCT in the last 72 hours. Urine analysis:    Component Value Date/Time   COLORURINE YELLOW 01/19/2017 2058   APPEARANCEUR CLEAR 02/07/2017 2058   LABSPEC 1.015 01/29/2017 2058   LABSPEC 1.015 07/15/2014 1000   PHURINE 5.0 01/13/2017 2058   GLUCOSEU NEGATIVE 01/29/2017 2058   GLUCOSEU Negative 07/15/2014 1000   HGBUR MODERATE (A) 01/20/2017 2058   BILIRUBINUR NEGATIVE 01/19/2017 2058   BILIRUBINUR Negative 07/15/2014 1000   KETONESUR NEGATIVE 02/05/2017 2058   PROTEINUR NEGATIVE 01/09/2017 2058   UROBILINOGEN 0.2 07/15/2014 1000   NITRITE NEGATIVE 01/23/2017 2058   LEUKOCYTESUR TRACE (A) 01/10/2017 2058   LEUKOCYTESUR Small 07/15/2014 1000   Sepsis  Labs: _0 (procalcitonin:4,lacticidven:4) )No results found for this or any previous visit (from the past 240 hour(s)).   Radiological Exams on Admission: Dg Chest 2 View  Result Date: 02/04/2017 CLINICAL DATA:  Metastatic breast cancer post mastectomy currently on chemotherapy. Shortness of breath and cough. EXAM: CHEST  2 VIEW COMPARISON:  09/05/2016 FINDINGS: Lungs are hypoinflated with mild bibasilar opacification and hazy prominence of the central perihilar markings. Findings are most concerning for infection with possible component of vascular congestion. No effusion. Cardiomediastinal silhouette and remainder of the exam is unchanged. IMPRESSION: Bibasilar airspace process likely infection. Possible component of vascular congestion. Electronically Signed   By: Marin Olp M.D.   On: 01/15/2017 20:13    Assessment/Plan Active Problems:   Metastatic breast cancer ([place])   Pancytopenia ([place])   Focal seizure ([place])   CAP (community acquired pneumonia)    1. Possible pneumonia -patient has been placed on vancomycin cefepime and also Zithromax.  Check influenza PCR sputum for cultures, blood cultures urine for strep and Legionella antigen.  Also check BNP and if elevated check 2D echo. 2. Pancytopenia likely from chemotherapy appears to be at baseline.  Follow CBC with differential. 3. 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 4. Metastatic breast cancer being followed by Dr. [name].   DVT prophylaxis: SCDs. Code Status: Full code. Family Communication: Discussed with patient. Disposition Plan: Home. Consults called: None. Admission status: Inpatient.   Rise Patience MD [facility] Pager [phone].  If 7PM-7AM, please contact night-coverage [email or web address] Password [email or web address]  01/10/2017, 10:01 PM

## 2017-02-08 NOTE — ED Notes (Signed)
Bed: WA04 Expected date:  Expected time:  Means of arrival:  Comments: EMS-cancer patient-pain with inspiration

## 2017-02-08 NOTE — ED Notes (Signed)
Patient refused second iv.

## 2017-02-08 NOTE — ED Triage Notes (Signed)
Patient BIB [facility] from home. Pt has hx of metastatic CA and is currently treating liver CA with radiation and chemo. Pt had radiation yesterday as well as platelets and 2 pts of bld. Pt also started Kepra yesterday for possible focal seizures and leg spasms. Pt woke this am with new onset and SOB triggered by RT leg spasms. Pts last chemo was 2 weeks ago, she receives that every 28 days.

## 2017-02-09 ENCOUNTER — Other Ambulatory Visit: Payer: Self-pay

## 2017-02-09 ENCOUNTER — Inpatient Hospital Stay (HOSPITAL_COMMUNITY): Payer: BC Managed Care – PPO

## 2017-02-09 ENCOUNTER — Telehealth: Payer: Self-pay | Admitting: Internal Medicine

## 2017-02-09 DIAGNOSIS — J189 Pneumonia, unspecified organism: Principal | ICD-10-CM

## 2017-02-09 DIAGNOSIS — D61818 Other pancytopenia: Secondary | ICD-10-CM

## 2017-02-09 DIAGNOSIS — Z170 Estrogen receptor positive status [ER+]: Secondary | ICD-10-CM

## 2017-02-09 DIAGNOSIS — C50411 Malignant neoplasm of upper-outer quadrant of right female breast: Secondary | ICD-10-CM

## 2017-02-09 DIAGNOSIS — E46 Unspecified protein-calorie malnutrition: Secondary | ICD-10-CM

## 2017-02-09 DIAGNOSIS — Z923 Personal history of irradiation: Secondary | ICD-10-CM

## 2017-02-09 DIAGNOSIS — R05 Cough: Secondary | ICD-10-CM

## 2017-02-09 LAB — BASIC METABOLIC PANEL
ANION GAP: 6 (ref 5–15)
BUN: 16 mg/dL (ref 6–20)
CHLORIDE: 114 mmol/L — AB (ref 101–111)
CO2: 16 mmol/L — ABNORMAL LOW (ref 22–32)
Calcium: 7.3 mg/dL — ABNORMAL LOW (ref 8.9–10.3)
Creatinine, Ser: 1.01 mg/dL — ABNORMAL HIGH (ref 0.44–1.00)
Glucose, Bld: 77 mg/dL (ref 65–99)
POTASSIUM: 4.1 mmol/L (ref 3.5–5.1)
SODIUM: 136 mmol/L (ref 135–145)

## 2017-02-09 LAB — STREP PNEUMONIAE URINARY ANTIGEN: STREP PNEUMO URINARY ANTIGEN: NEGATIVE

## 2017-02-09 LAB — CBC
HEMATOCRIT: 26.5 % — AB (ref 36.0–46.0)
HEMOGLOBIN: 9.1 g/dL — AB (ref 12.0–15.0)
MCH: 29 pg (ref 26.0–34.0)
MCHC: 34.3 g/dL (ref 30.0–36.0)
MCV: 84.4 fL (ref 78.0–100.0)
Platelets: 27 10*3/uL — CL (ref 150–400)
RBC: 3.14 MIL/uL — AB (ref 3.87–5.11)
RDW: 15.3 % (ref 11.5–15.5)
WBC: 1.9 10*3/uL — ABNORMAL LOW (ref 4.0–10.5)

## 2017-02-09 LAB — BRAIN NATRIURETIC PEPTIDE: B Natriuretic Peptide: 132.2 pg/mL — ABNORMAL HIGH (ref 0.0–100.0)

## 2017-02-09 MED ORDER — GLYCOPYRROLATE 0.2 MG/ML IJ SOLN
0.2000 mg | INTRAMUSCULAR | Status: DC | PRN
  Filled 2017-02-09: qty 1

## 2017-02-09 MED ORDER — SODIUM CHLORIDE 0.9 % IV SOLN
12.5000 mg | Freq: Four times a day (QID) | INTRAVENOUS | Status: DC | PRN
  Filled 2017-02-09: qty 0.5

## 2017-02-09 MED ORDER — LORAZEPAM 2 MG/ML IJ SOLN
1.0000 mg | Freq: Four times a day (QID) | INTRAMUSCULAR | Status: DC | PRN
  Administered 2017-02-09 – 2017-02-10 (×2): 1 mg via INTRAVENOUS
  Filled 2017-02-09 (×2): qty 1

## 2017-02-09 MED ORDER — IPRATROPIUM-ALBUTEROL 0.5-2.5 (3) MG/3ML IN SOLN
3.0000 mL | Freq: Four times a day (QID) | RESPIRATORY_TRACT | Status: DC
  Administered 2017-02-09 (×2): 3 mL via RESPIRATORY_TRACT
  Filled 2017-02-09: qty 3

## 2017-02-09 MED ORDER — GLYCOPYRROLATE 1 MG PO TABS
1.0000 mg | ORAL_TABLET | ORAL | Status: DC | PRN
  Filled 2017-02-09: qty 1

## 2017-02-09 MED ORDER — DEXTROSE 5 % IV SOLN
INTRAVENOUS | Status: DC
  Administered 2017-02-09: 16:00:00 via INTRAVENOUS

## 2017-02-09 MED ORDER — VANCOMYCIN HCL IN DEXTROSE 750-5 MG/150ML-% IV SOLN
750.0000 mg | INTRAVENOUS | Status: DC
  Administered 2017-02-09: 750 mg via INTRAVENOUS
  Filled 2017-02-09 (×3): qty 150

## 2017-02-09 MED ORDER — MORPHINE SULFATE (PF) 4 MG/ML IV SOLN
2.0000 mg | INTRAVENOUS | Status: DC | PRN
  Administered 2017-02-09 – 2017-02-10 (×3): 2 mg via INTRAVENOUS
  Filled 2017-02-09 (×4): qty 1

## 2017-02-09 MED ORDER — BIOTENE DRY MOUTH MT LIQD: 15.0000 mL | OROMUCOSAL | Status: DC | PRN

## 2017-02-09 MED ORDER — IPRATROPIUM-ALBUTEROL 0.5-2.5 (3) MG/3ML IN SOLN
3.0000 mL | RESPIRATORY_TRACT | Status: DC
  Administered 2017-02-09 – 2017-02-11 (×9): 3 mL via RESPIRATORY_TRACT
  Filled 2017-02-09 (×8): qty 3

## 2017-02-09 MED ORDER — PANTOPRAZOLE SODIUM 40 MG PO TBEC: 40.0000 mg | DELAYED_RELEASE_TABLET | Freq: Every day | ORAL | Status: DC

## 2017-02-09 MED ORDER — DIPHENHYDRAMINE HCL 50 MG/ML IJ SOLN: 12.5000 mg | INTRAMUSCULAR | Status: DC | PRN

## 2017-02-09 MED ORDER — ONDANSETRON 4 MG PO TBDP: 4.0000 mg | ORAL_TABLET | Freq: Four times a day (QID) | ORAL | Status: DC | PRN

## 2017-02-09 MED ORDER — ALUM & MAG HYDROXIDE-SIMETH 200-200-20 MG/5ML PO SUSP: 30.0000 mL | Freq: Four times a day (QID) | ORAL | Status: DC | PRN

## 2017-02-09 MED ORDER — POLYVINYL ALCOHOL 1.4 % OP SOLN: 1.0000 [drp] | Freq: Four times a day (QID) | OPHTHALMIC | Status: DC | PRN

## 2017-02-09 MED ORDER — ONDANSETRON HCL 4 MG/2ML IJ SOLN
4.0000 mg | Freq: Four times a day (QID) | INTRAMUSCULAR | Status: DC | PRN
  Administered 2017-02-09: 4 mg via INTRAVENOUS
  Filled 2017-02-09: qty 2

## 2017-02-09 MED ORDER — FUROSEMIDE 10 MG/ML IJ SOLN
40.0000 mg | Freq: Once | INTRAMUSCULAR | Status: AC
  Administered 2017-02-09: 40 mg via INTRAVENOUS
  Filled 2017-02-09: qty 4

## 2017-02-09 MED ORDER — ALBUTEROL SULFATE (2.5 MG/3ML) 0.083% IN NEBU
INHALATION_SOLUTION | RESPIRATORY_TRACT | Status: AC
  Filled 2017-02-09: qty 3

## 2017-02-09 MED ORDER — ALBUTEROL SULFATE (2.5 MG/3ML) 0.083% IN NEBU: 2.5000 mg | INHALATION_SOLUTION | RESPIRATORY_TRACT | Status: DC | PRN

## 2017-02-09 MED ORDER — SODIUM CHLORIDE 0.9 % IV BOLUS (SEPSIS)
1000.0000 mL | Freq: Once | INTRAVENOUS | Status: AC
  Administered 2017-02-09: 1000 mL via INTRAVENOUS

## 2017-02-09 NOTE — Progress Notes (Signed)
Carolyn [name]   DOB:14-Jun-1970   XT#:[id number]   GDJ#:[id number]  Subjective: Kassity had intractable cough, no fever; CXR c/w possible pneumonia; she tells me she is breathing better; sister in room   Objective: frailAfrican American woman examined in bed Vitals:   02/09/17 1530 02/09/17 1547  BP:    Pulse:    Resp:  (!) 56  Temp: 98.6 F (37 C)   SpO2:      Body mass index is 17.54 kg/m.  Intake/Output Summary (Last 24 hours) at 02/09/2017 1659 Last data filed at 02/09/2017 1607 Gross per 24 hour  Intake 4421.25 ml  Output -  Net 4421.25 ml    Lungs no rales or wheezes, examined anterolaterlly  CBG (last 3)  No results for input(s): GLUCAP in the last 72 hours.   Labs:  Lab Results  Component Value Date   WBC 1.9 (L) 02/09/2017   HGB 9.1 (L) 02/09/2017   HCT 26.5 (L) 02/09/2017   MCV 84.4 02/09/2017   PLT 27 (LL) 02/09/2017   NEUTROABS 0.9 02/01/2017    '@LASTCHEMISTRY' @  Urine Studies No results for input(s): UHGB, CRYS in the last 72 hours.  Invalid input(s): UACOL, UAPR, USPG, UPH, UTP, UGL, UKET, UBIL, UNIT, UROB, ULEU, UEPI, UWBC, URBC, UBAC, CAST, [place], [place]  Basic Metabolic Panel: Recent Labs  Lab 02/06/17 1328 02/01/2017 2020 02/09/17 0542  NA 137 139 136  K 4.7 4.8 4.1  CL 109 112* 114*  CO2 18* 19* 16*  GLUCOSE 76 82 77  BUN 30* 19 16  CREATININE  --  1.17* 1.01*  CALCIUM 8.8 8.2* 7.3*   GFR Estimated Creatinine Clearance: 55.2 mL/min (A) (by C-G formula based on SCr of 1.01 mg/dL (H)). Liver Function Tests: Recent Labs  Lab 02/06/17 1328 02/03/2017 2020  AST 55* 58*  ALT 39 34  ALKPHOS 168* 167*  BILITOT 0.7 0.5  PROT 6.6 7.2  ALBUMIN 3.1* 3.3*   No results for input(s): LIPASE, AMYLASE in the last 168 hours. No results for input(s): AMMONIA in the last 168 hours. Coagulation profile No results for input(s): INR, PROTIME in the last 168 hours.  CBC: Recent Labs  Lab 02/06/17 1328 01/17/2017 2020 02/09/17 0542  WBC 1.8* 2.0* 1.9*   NEUTROABS 0.9* 0.9  --   HGB  --  10.9* 9.1*  HCT 18.2* 31.6* 26.5*  MCV 86.7 84.3 84.4  PLT 15* 29* 27*   Cardiac Enzymes: No results for input(s): CKTOTAL, CKMB, CKMBINDEX, TROPONINI in the last 168 hours. BNP: Invalid input(s): POCBNP CBG: No results for input(s): GLUCAP in the last 168 hours. D-Dimer No results for input(s): DDIMER in the last 72 hours. Hgb A1c No results for input(s): HGBA1C in the last 72 hours. Lipid Profile No results for input(s): CHOL, HDL, LDLCALC, TRIG, CHOLHDL, LDLDIRECT in the last 72 hours. Thyroid function studies No results for input(s): TSH, T4TOTAL, T3FREE, THYROIDAB in the last 72 hours.  Invalid input(s): FREET3 Anemia work up No results for input(s): VITAMINB12, FOLATE, FERRITIN, TIBC, IRON, RETICCTPCT in the last 72 hours. Microbiology No results found for this or any previous visit (from the past 240 hour(s)).    Studies:  Dg Chest 2 View  Result Date: 01/10/2017 CLINICAL DATA:  Metastatic breast cancer post mastectomy currently on chemotherapy. Shortness of breath and cough. EXAM: CHEST  2 VIEW COMPARISON:  09/05/2016 FINDINGS: Lungs are hypoinflated with mild bibasilar opacification and hazy prominence of the central perihilar markings. Findings are most concerning for infection with possible  component of vascular congestion. No effusion. Cardiomediastinal silhouette and remainder of the exam is unchanged. IMPRESSION: Bibasilar airspace process likely infection. Possible component of vascular congestion. Electronically Signed   By: Marin Olp M.D.   On: 01/16/2017 20:13    Assessment: 47 y.o. BRCA negative [place] woman status post right breast upper outer quadrant and right axillary lymph node biopsy 05/18/2011, both positive for an invasive ductal carcinoma, high-grade, clinicallyT2 N1-2 or stage IIB/IIIA,  estrogen receptor 100% positive, progesterone receptor 87% positive, with an MIB-1 of 14% and no HER-2 amplification (SAA  [id number]).  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   (3)Treated neoadjuvantly with cyclophosphamide and docetaxel x4 completed 08/29/2011.  (4) status post right modified radical mastectomy 10/03/2011 showing a residual pT1c pN1a (3/18 lymph nodes positive) invasive ductal carcinoma, grade 1,estrogen receptor 100% positive, progesterone receptor negative, with no HER-2 amplification (SZA [phone])  (5) adjuvant radiation to the right chest wall, right supraclavicular fossa and right scar completed 12/26/2011  (6) tamoxifen started January 2014-- discontinued April 2016 with evidence of metastatic disease  (7) status post total hysterectomy with bilateral salpingo-oophorectomy 12/23/2012 with benign pathology (SZD [id number])  METASTATIC DISEASE 04/13/2014 (8) presented with T8 cord compression and underwent laminectomy 04/13/2014 with T8 decompression of spinal cord, posterior fixation from T6-T10 with pedicle screws and rods, posterior arthrodesis with allograft. 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 ExacTrac Snap verification was performed for  each couch angle.              (b) radiation to the T-spine completed 05/08/2014.             (c) [facility]             (d) SRS to 0.4 cm left parietal lesion 12/23/2015  (11) started fulvestrant 05/18/2014 and Palbociclib 06/01/2014             (a) Palbociclib dose decreased to 100 mg daily, 21/7 as of 08/05/2014             (b) palbociclib dose decreased to 75 mg daily, 21/7, starting May 2017             (c) palbociclib dose decreased to 75 mg every other day beginning 07/26/2015               (d) palbociclib held for month of October because of persistent  low counts, resumed November             (e) fulvestrant discontinued after 11/02/2016 dose: Anastrozole started 08/01/2016                       (12) started zolendronate 05/18/2014, repeated every 12 weeks             (a) July zolendronate dose held because of dental issues, resumed 10/18/2015             (b) switched to denosumab as of 01/20/2016 with concern of bony progression on zolendronate             (c) switching back to zolendronate as of April 2018 at patient's request             (d) switched to denosumab/Xgeva because of hypercalcemia, first dose 09/13/2016  (13) most recent staging studies:             (a) brain MRI 07/13/2016 shows stable to smaller treated lesions.             (b) bone scan 04/06/2016 shows no new lesions             (c) chest CT scan 04/06/2016 shows small bilateral pulmonary nodules with no evidence of disease progression             (d) MRI of the thoracolumbar spine shows diffuse bony metastases particularly involving T8, T11 and T12, but without epidural disease. There were subcentimeter pulmonary nodules and indeterminate liver lesions. CT scan of the left hip 09/06/2016 showed multiple bony metastases but no fracture             (e) PET scan 12/08/2016 and liver MRI 12/04/2016 shows several liver lesions at least 1 of which is  hypermetabolic (1.4 cm, SUV max 3.9)             (f) MRI of the brain on 01/25/2017 showed progression  (14) anastrozole resumed 10/20/2016, discontinued 12/13/2016             (a) everolimus started 09/25/2016, discontinued 12/06/2016  (15) palliative radiation to the right hip completed 09/26/2016  (16) started Doxil 12/25/2016, to be repeated every 28 days (after 3-4 cycles of Doxil will restage with an MRI of the liver and likely repeat PET scan, MRI brain repeat due mid January 2019)             (a) baseline echocardiogram on 12/22/16 shows LVEF of 55-60%  (17) pancytopenia, secondary to chemotherapy and the cancer itself as well as renal insufficiency             (a) aranesp started 01/29/2017, to be repeated every 2 weeks  (18) severe protein calorie malnutrition  (19) further irradiation to the new brain spots pending             (a) referral to endocrinology in process             (b) referral to palliative care for consideration of goals of care also in process   Plan:  Dillon has advanced stage IV brest cancer as listed above. She has had a significant decline in the last few months and weeks. While we hope to be able to get her through the current problem there is a possibility she may take a turn for the worse.   I discussed advanced directives with her and her sister. Charlott understands CPR and intubation would not help her  and could hurt her. I wrote a limited DNR order to operationalize her wishes.  Appreciate your help to this patient!   Bobetta Lime, MD 02/09/2017  4:59 PM Medical Oncology and Hematology [facility] [address] [place], [place] [address] Tel. [phone]    Fax. [phone]

## 2017-02-09 NOTE — Progress Notes (Signed)
Pharmacy Antibiotic Note  Carolyn [name] is a 47 y.o. female admitted on 02/02/2017 with pneumonia.  Pharmacy has been consulted for Vancomycin, cefepime dosing.  Plan: Vancomycin 1gm iv x1, then 1750mg  iv q24hr  Goal AUC = 400 - 500 for all indications, except meningitis (goal AUC > 500 and Cmin 15-20 mcg/mL)   Cefepime 2gm iv x1, then 1gm iv q12hr   Height: 5\' 7"  (170.2 cm) Weight: 112 lb (50.8 kg) IBW/kg (Calculated) : 61.6  Temp (24hrs), Avg:99.1 F (37.3 C), Min:99.1 F (37.3 C), Max:99.1 F (37.3 C)  Recent Labs  Lab 02/06/17 1328 01/19/2017 2020 01/29/2017 2041 02/09/17 0542  WBC 1.8* 2.0*  --  1.9*  CREATININE  --  1.17*  --  1.01*  LATICACIDVEN  --   --  1.45  --     Estimated Creatinine Clearance: 55.2 mL/min (A) (by C-G formula based on SCr of 1.01 mg/dL (H)).    Allergies  Allergen Reactions  . Allegra [Fexofenadine] Hives    Abdomen only  . Cat Hair Extract Other (See Comments)  . Shellfish Allergy Hives    Abdomen only  . Glucosamine Rash    Antimicrobials this admission: Vancomycin 01/24/2017 >> Cefepime 01/21/2017 >>   Dose adjustments this admission: -  Microbiology results: -  Thank you for allowing pharmacy to be a part of this patient's care.  Nani Skillern Crowford 02/09/2017 7:26 AM

## 2017-02-09 NOTE — Progress Notes (Signed)
Pharmacy: Re- vancomycin  Patient is a 46 y.o F with metastatic breast cancer currently undergoing chemotherapy treatment presented to the ED on 01/17/2017 with c/o cough and chest discomfort. Vancomycin, cefepime and azithromycin started on admission for PNA  Plan: - continue with vancomycin 750mg  IV q24h for now (for est AUC 449) - cefepime 1gm IV q12h - azithromycin 500 mg IV q24h  Dia Sitter, PharmD, BCPS 02/09/2017 12:00 PM

## 2017-02-09 NOTE — Progress Notes (Signed)
Received patient from ED, in active respiratory distress, VS obtained, O2 increased to 4L [place], RT called, MD paged to update on status by charge nurse with new orders given, gave prn Morphine for Resp >50.

## 2017-02-09 NOTE — Progress Notes (Signed)
Patient Demographics:    Carolyn [name], is a 47 y.o. female, DOB - [birth year], SAY:[id number]  Admit date - 01/19/2017   Admitting Physician Rise Patience, MD  Outpatient Primary MD for the patient is Default, Provider, MD  LOS - 1   Chief Complaint  Patient presents with  . Respiratory Distress  . Cough  . 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 [name], patient has agreed to Partial Code (no CPR, no intubation), okay to use BiPAP, okay to use ACLS medications  4)Pancytopenia-due to underlying malignancy with chemotherapy treatments, no evidence of ongoing acute bleeding at this time,  patient had transfusion of packed red blood cells a couple days ago, transfuse platelets if bleeding or if less than 10K  5)Metastatic/stage IV breast cancer with metastases to the bones, brain and liver-ecology consult from Dr. [name] appreciated, overall prognosis is grave  Code Status :  Partial Code (no CPR, no intubation), okay to use BiPAP, okay to use ACLS medications  Disposition Plan  : TBD  Consults  : Oncology  DVT Prophylaxis  :    SCDs   Lab Results  Component Value Date   PLT 27 (LL) 02/09/2017     Inpatient Medications  Scheduled Meds: . albuterol      . gabapentin  300 mg Oral QHS  . ipratropium-albuterol  3 mL Nebulization Q6H  . levETIRAcetam  500 mg Oral BID  . multivitamin with minerals  1 tablet Oral Daily  . pantoprazole  40 mg Oral Daily  . polyethylene glycol  17 g Oral Daily   Continuous Infusions: . azithromycin Stopped (02/09/17 0109)  . ceFEPime (MAXIPIME) IV Stopped (02/09/17 1135)  . chlorproMAZINE (THORAZINE) IV    . dextrose 75 mL/hr at 02/09/17 1617  . vancomycin     PRN Meds:.acetaminophen **OR** acetaminophen, albuterol, alum & mag hydroxide-simeth, antiseptic oral rinse, chlorproMAZINE (THORAZINE) IV, diphenhydrAMINE, glycopyrrolate **OR** glycopyrrolate **OR** glycopyrrolate, LORazepam, morphine injection, ondansetron **OR** ondansetron (ZOFRAN) IV, polyvinyl alcohol    Anti-infectives (From admission, onward)   Start     Dose/Rate Route Frequency Ordered Stop   02/09/17 2200  vancomycin (VANCOCIN) IVPB 750 mg/150 ml premix     750 mg 150 mL/hr over 60 Minutes Intravenous Every 24 hours 02/09/17 0725     01/27/2017 2230  azithromycin (ZITHROMAX) 500 mg in dextrose 5 % 250 mL IVPB     500 mg 250 mL/hr over 60 Minutes Intravenous Daily at bedtime 01/15/2017 2200     02/07/2017 2230  ceFEPIme (MAXIPIME) 1 g in dextrose 5 % 50 mL IVPB  1 g 100 mL/hr over 30 Minutes Intravenous 2 times daily 02/05/2017 2200 02/16/17 2159   01/21/2017 2100  ceFEPIme (MAXIPIME) 2 g in dextrose 5 % 50 mL IVPB     2 g 100 mL/hr over 30 Minutes Intravenous  Once 02/06/2017 2057 01/26/2017 2216   01/19/2017 2100  vancomycin (VANCOCIN) IVPB 1000 mg/200 mL premix     1,000 mg 200 mL/hr over 60 Minutes Intravenous  Once 01/10/2017 2057 01/19/2017 2336        Objective:   Vitals:   02/09/17 1400 02/09/17 1430 02/09/17 1530 02/09/17 1547  BP: (!) 145/76 (!) 147/77    Pulse: (!) 112 (!) 115    Resp: (!) 48 (!) 36  (!) 56  Temp:   98.6 F (37 C)   TempSrc:   Oral   SpO2: 97% 99%     Weight:      Height:        Wt Readings from Last 3 Encounters:  01/21/2017 50.8 kg (112 lb)  02/07/17 50.8 kg (112 lb)  02/06/17 51 kg (112 lb 8 oz)     Intake/Output Summary (Last 24 hours) at 02/09/2017 1752 Last data filed at 02/09/2017 1715 Gross per 24 hour  Intake 4493.75 ml  Output -  Net 4493.75 ml    Physical Exam  Gen:- Awake Alert,  tachypnea HEENT:- [place].AT, No sclera icterus Neck-Supple Neck,No JVD,.  Nose- [place] 2 L/min Lungs-diminished breath sounds with scattered rhonchi bilaterally CV- S1, S2 normal Abd-  +ve B.Sounds, Abd Soft, No tenderness,    Extremity/Skin:- No  edema,    Psych-flat affect,   Data Review:   Micro Results No results found for this or any previous visit (from the past 240 hour(s)).  Radiology Reports Dg Chest 2 View  Result Date: 02/05/2017 CLINICAL DATA:  Metastatic breast cancer post mastectomy currently on chemotherapy. Shortness of breath and cough. EXAM: CHEST  2 VIEW COMPARISON:  09/05/2016 FINDINGS: Lungs are hypoinflated with mild bibasilar opacification and hazy prominence of the central perihilar markings. Findings are most concerning for infection with possible component of vascular congestion. No effusion. Cardiomediastinal silhouette and remainder of the exam is unchanged. IMPRESSION: Bibasilar airspace process likely infection. Possible component of vascular congestion. Electronically Signed   By: Marin Olp M.D.   On: 01/22/2017 20:13   Mr Jeri Cos IR Contrast  Result Date: 01/25/2017 CLINICAL DATA:  47 year old female with metastatic breast cancer. Completed stereotactic radiosurgery to a left parietal lesion on 04/29/2014 to a dose of 20 gray, followed by auto-litt procedure (laser ablation) November 2016. Small right parietal lobe metastasis discovered on MRI in November 2017 status post SRS on 12/1415 to a dose of 20 Gy. Osseous metastatic disease. EXAM: MRI HEAD WITHOUT AND WITH CONTRAST TECHNIQUE: Multiplanar, multiecho  pulse sequences of the brain and surrounding structures were obtained without and with intravenous contrast. CONTRAST:  86mL MULTIHANCE GADOBENATE DIMEGLUMINE 529 MG/ML IV SOLN COMPARISON:  10/19/2016 and earlier. FINDINGS: Brain: Stable mixed T1 signal, dark T2 signal left periatrial treated lesion (series 5, image 17 and series 10, image 100) with only faint adjacent FLAIR hyperintensity and no regional mass effect. Enlargement of the treated right inferior parietal metastasis which has increased from 4 millimeters to 12-13 millimeters largest dimension as seen on series 10, image 88. Furthermore, confluent surrounding T2 and FLAIR hyperintensity has developed since October with mild regional mass effect suggesting vasogenic edema. See series 10, image 88 and series 7, image 29. Furthermore, the lesion now demonstrates  confluent hemosiderin on T2* imaging (series 6, image 16) which is new since July 2018, and only punctate blood products were evident in October. Subsequently, the enhancing portion of the lesion is almost completely dark on T2 weighted imaging (series 5, image 16). Furthermore, the pituitary and pituitary infundibulum have become abnormal, and are now thickened and lobulated with hyperenhancement. Precontrast T1 weighted images suggest the presence of a T1 hypointense enhancing metastasis occupying the posterior aspect of the sella and compressing the remainder of the gland the hypothalamus remains normal gland anteriorly (series 2, image 19). There is associated abnormal hypothalamic T2 and FLAIR hyperintensity compatible with edema (series 7, image 25). The cavernous sinus appears to remain normal. Small new 5 millimeter enhancing metastasis in the left inferior occipital lobe has developed since October with mild surrounding edema but no significant mass effect. See series 10, image 53. No other abnormal intracranial enhancement identified. No dural thickening. No ventriculomegaly or midline  shift. No restricted diffusion or evidence of acute infarction. No acute intracranial hemorrhage identified. Stable gray and white matter signal elsewhere. Negative cervicomedullary junction. Vascular: Major intracranial vascular flow voids are stable. Skull and upper cervical spine: Diffusely abnormal bone marrow signal in keeping with progressive osseous metastatic disease since July 2017. There is a chronic left temporal bone bone lesion near the semicircular canals (series 5, image 8) which has been stable since 2,016 and is favored to be benign. No definite abnormality of the visible cervical spinal cord. Sinuses/Orbits: Normal orbits soft tissues. Negative paranasal sinuses. Other: Visible internal auditory structures appear normal. Mastoids remain clear. No scalp or face soft tissue abnormality identified. IMPRESSION: 1. Progression of disease since 10/19/2016. 2. 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 Associated hypothalamic edema without significant regional mass effect. - 5 millimeter left inferior occipital lobe. Mild edema with no mass effect. 3. Enlargement of the treated right inferior parietal lobe metastasis from 4 mm now to 12-13 mm with new confluent vasogenic edema and mild regional mass effect. However, the appearance is complicated by evidence of interval hemorrhage within the lesion. Still in light of #2, the appearance is suspicious for true progression. 4. Progressive osseous metastatic disease since July 2018. It is possible that the pituitary involvement is via contiguous spread from the skull base. 5. The treated left periatrial lesion remains stable. Electronically Signed   By: Genevie Ann M.D.   On: 01/25/2017 11:18     CBC Recent Labs  Lab 02/06/17 1328 01/19/2017 2020 02/09/17 0542  WBC 1.8* 2.0* 1.9*  HGB  --  10.9* 9.1*  HCT 18.2* 31.6* 26.5*  PLT 15* 29* 27*  MCV 86.7 84.3 84.4  MCH 29.3 29.1 29.0  MCHC 33.8 34.5 34.3  RDW 14.9 15.4 15.3    LYMPHSABS 0.8* 0.9  --   MONOABS 0.1 0.2  --   EOSABS 0.0 0.0  --   BASOSABS 0.0 0.0  --     Chemistries  Recent Labs  Lab 02/06/17 1328 02/01/2017 2020 02/09/17 0542  NA 137 139 136  K 4.7 4.8 4.1  CL 109 112* 114*  CO2 18* 19* 16*  GLUCOSE 76 82 77  BUN 30* 19 16  CREATININE  --  1.17* 1.01*  CALCIUM 8.8 8.2* 7.3*  AST 55* 58*  --   ALT 39 34  --   ALKPHOS 168* 167*  --   BILITOT 0.7 0.5  --    ------------------------------------------------------------------------------------------------------------------ No results for input(s): CHOL, HDL, LDLCALC, TRIG, CHOLHDL, LDLDIRECT  in the last 72 hours.  No results found for: HGBA1C ------------------------------------------------------------------------------------------------------------------ No results for input(s): TSH, T4TOTAL, T3FREE, THYROIDAB in the last 72 hours.  Invalid input(s): FREET3 ------------------------------------------------------------------------------------------------------------------ No results for input(s): VITAMINB12, FOLATE, FERRITIN, TIBC, IRON, RETICCTPCT in the last 72 hours.  Coagulation profile No results for input(s): INR, PROTIME in the last 168 hours.  No results for input(s): DDIMER in the last 72 hours.  Cardiac Enzymes No results for input(s): CKMB, TROPONINI, MYOGLOBIN in the last 168 hours.  Invalid input(s): CK ------------------------------------------------------------------------------------------------------------------    Component Value Date/Time   BNP 132.2 (H) 02/09/2017 4037    Roxan Hockey M.D on 02/09/2017 at 5:52 PM  Between 7am to 7pm - Pager - [phone]  After 7pm go to [email or web address] - password [email or web address]  [facility] -  Office  [phone]   Voice Recognition Viviann Spare dictation system was used to create this note, attempts have been made to correct errors. Please contact the author with questions and/or clarifications.

## 2017-02-09 NOTE — ED Notes (Signed)
Report given to [id number].

## 2017-02-09 NOTE — Telephone Encounter (Signed)
Per 1/30 no los 

## 2017-02-09 NOTE — ED Notes (Signed)
ED TO INPATIENT HANDOFF REPORT  Name/Age/Gender Carolyn [name] 47 y.o. female  Code Status    Code Status Orders  (From admission, onward)        Start     Ordered   01/20/2017 2159  Full code  Continuous     01/27/2017 2200    Code Status History    Date Active Date Inactive Code Status Order ID Comments User Context   09/05/2016 23:03 09/14/2016 20:30 Full Code [id number]  Rise Patience, MD ED   04/20/2014 15:17 04/25/2014 17:21 Full Code [id number]  Bary Leriche, PA-C Inpatient   04/16/2014 07:14 04/20/2014 15:17 Full Code [id number]  Kristeen Miss, MD Inpatient   04/13/2014 01:37 04/16/2014 07:14 Full Code [id number]  Kristeen Miss, MD Inpatient   02/28/2014 21:50 03/02/2014 13:49 Full Code [id number]  Leighton Ruff, MD Inpatient   12/23/2012 12:20 12/24/2012 19:21 Full Code [id number]  Marvene Staff, MD Inpatient    Advance Directive Documentation     Most Recent Value  Type of Advance Directive  Healthcare Power of Attorney, Living will  Pre-existing out of facility DNR order (yellow form or pink MOST form)  No data  "MOST" Form in Place?  No data      Home/SNF/Other Home  Chief Complaint Respitory distress  Level of Care/Admitting Diagnosis ED Disposition    ED Disposition Condition Comment   Admit  Hospital Area: [facility] [[id number]]  Level of Care: Telemetry [5]  Admit to tele based on following criteria: Monitor for Ischemic changes  Diagnosis: CAP (community acquired pneumonia) [867672]  Admitting Physician: Rise Patience [phone]  Attending Physician: Rise Patience [phone]  Estimated length of stay: past midnight tomorrow  Certification:: I certify this patient will need inpatient services for at least 2 midnights  PT Class (Do Not Modify): Inpatient [101]  PT Acc Code (Do Not Modify): Private [1]       Medical History Past Medical History:  Diagnosis Date  . 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 History of chemotherapy 06/27/11 -09/04/11   neoadjuvant  . Hx of radiation therapy 11/08/11 -12/26/11   right chest wall/supraclav fossa, right scar  . S/P radiation therapy 05/08/14   SRS brain    Allergies Allergies  Allergen Reactions  . Allegra [Fexofenadine] Hives    Abdomen only  . Cat Hair Extract Other (See Comments)  . Shellfish Allergy Hives    Abdomen only  . Glucosamine Rash    IV Location/Drains/Wounds Patient Lines/Drains/Airways Status   Active Line/Drains/Airways    Name:   Placement date:   Placement time:   Site:   Days:   Peripheral IV 01/21/2017 Left Forearm   01/19/2017    2020    Forearm   1   Closed System Drain 1 Right Chest 19 Fr.   10/03/11    1220    Chest   1956   Closed System Drain 2 Right Chest 19 Fr.   10/03/11    1220    Chest   1956   Incision 06/20/11 Chest Left   06/20/11    1052     2061   Incision 10/03/11 Chest Right   10/03/11    1219     1956   Incision 12/23/12 Abdomen   12/23/12    0706     1509   Incision 12/23/12 Perineum   12/23/12    0706  1509   Incision (Closed) 03/01/14 Abdomen Other (Comment)   03/01/14    0850     1076   Incision (Closed) 04/13/14 Back Other (Comment)   04/13/14    1558     1033   Incision - 4 Ports Abdomen 1: Upper;Umbilicus 2: Left;Lateral 3: Right;Medial;Lateral 4: Right;Lateral;Lower   12/23/12    0840     1509   Incision - 4 Ports Abdomen 1: Right;Lower;Lateral 2: Right;Upper 3: Umbilicus 4: Superior;Mid   03/01/14    0840     1076          Labs/Imaging Results for orders placed or performed during the hospital encounter of 01/25/2017 (from the past 48 hour(s))  CBC with Differential/Platelet     Status: Abnormal   Collection Time: 01/28/2017  8:20 PM  Result Value Ref Range   WBC 2.0 (L) 4.0 - 10.5 K/uL   RBC 3.75 (L) 3.87 - 5.11 MIL/uL   Hemoglobin 10.9 (L) 12.0 - 15.0 g/dL   HCT 31.6 (L) 36.0 - 46.0 %   MCV 84.3 78.0 - 100.0 fL   MCH 29.1 26.0 - 34.0 pg   MCHC  34.5 30.0 - 36.0 g/dL   RDW 15.4 11.5 - 15.5 %   Platelets 29 (LL) 150 - 400 K/uL    Comment: REPEATED TO VERIFY SPECIMEN CHECKED FOR CLOTS PLATELET COUNT CONFIRMED BY SMEAR CRITICAL RESULT CALLED TO, READ BACK BY AND VERIFIED WITH: Jordan Hawks RN 2124 02/06/2017 A NAVARRO    Neutrophils Relative % 44 %   Neutro Abs 0.9 1.7 - 7.7 K/uL   Lymphocytes Relative 43 %   Lymphs Abs 0.9 0.7 - 4.0 K/uL   Monocytes Relative 9 %   Monocytes Absolute 0.2 0.1 - 1.0 K/uL   Eosinophils Relative 2 %   Eosinophils Absolute 0.0 0.0 - 0.7 K/uL   Basophils Relative 2 %   Basophils Absolute 0.0 0.0 - 0.1 K/uL   WBC Morphology WHITE COUNT CONFIRMED ON SMEAR     Comment: ADJUSTED FOR NUCLEATED RBC'S CORRECTED ON 01/31 AT 2139: PREVIOUSLY REPORTED AS WHITE COUNT CONFIRMED ON SMEAR ADJUSTED FOR NUCLEATED RBC'S   Comprehensive metabolic panel     Status: Abnormal   Collection Time: 02/01/2017  8:20 PM  Result Value Ref Range   Sodium 139 135 - 145 mmol/L   Potassium 4.8 3.5 - 5.1 mmol/L   Chloride 112 (H) 101 - 111 mmol/L   CO2 19 (L) 22 - 32 mmol/L   Glucose, Bld 82 65 - 99 mg/dL   BUN 19 6 - 20 mg/dL   Creatinine, Ser 1.17 (H) 0.44 - 1.00 mg/dL   Calcium 8.2 (L) 8.9 - 10.3 mg/dL   Total Protein 7.2 6.5 - 8.1 g/dL   Albumin 3.3 (L) 3.5 - 5.0 g/dL   AST 58 (H) 15 - 41 U/L   ALT 34 14 - 54 U/L   Alkaline Phosphatase 167 (H) 38 - 126 U/L   Total Bilirubin 0.5 0.3 - 1.2 mg/dL   GFR calc non Af Amer 55 (L) >60 mL/min   GFR calc Af Amer >60 >60 mL/min    Comment: (NOTE) The eGFR has been calculated using the CKD EPI equation. This calculation has not been validated in all clinical situations. eGFR's persistently <60 mL/min signify possible Chronic Kidney Disease.    Anion gap 8 5 - 15  I-Stat CG4 Lactic Acid, ED     Status: None   Collection Time: 02/05/2017  8:41 PM  Result Value Ref Range   Lactic Acid, Venous 1.45 0.5 - 1.9 mmol/L  Urinalysis, Routine w reflex microscopic     Status: Abnormal    Collection Time: 02/06/2017  8:58 PM  Result Value Ref Range   Color, Urine YELLOW YELLOW   APPearance CLEAR CLEAR   Specific Gravity, Urine 1.015 1.005 - 1.030   pH 5.0 5.0 - 8.0   Glucose, UA NEGATIVE NEGATIVE mg/dL   Hgb urine dipstick MODERATE (A) NEGATIVE   Bilirubin Urine NEGATIVE NEGATIVE   Ketones, ur NEGATIVE NEGATIVE mg/dL   Protein, ur NEGATIVE NEGATIVE mg/dL   Nitrite NEGATIVE NEGATIVE   Leukocytes, UA TRACE (A) NEGATIVE   RBC / HPF 0-5 0 - 5 RBC/hpf   WBC, UA 0-5 0 - 5 WBC/hpf   Bacteria, UA NONE SEEN NONE SEEN   Squamous Epithelial / LPF 0-5 (A) NONE SEEN  Strep pneumoniae urinary antigen     Status: None   Collection Time: 01/21/2017 10:01 PM  Result Value Ref Range   Strep Pneumo Urinary Antigen NEGATIVE NEGATIVE    Comment:        Infection due to S. pneumoniae cannot be absolutely ruled out since the antigen present may be below the detection limit of the test. Performed at [facility], [address] [place], [place] [address]   Influenza panel by PCR (type A & B)     Status: None   Collection Time: 01/22/2017 10:54 PM  Result Value Ref Range   Influenza A By PCR NEGATIVE NEGATIVE   Influenza B By PCR NEGATIVE NEGATIVE    Comment: (NOTE) The Xpert Xpress Flu assay is intended as an aid in the diagnosis of  influenza and should not be used as a sole basis for treatment.  This  assay is FDA approved for nasopharyngeal swab specimens only. Nasal  washings and aspirates are unacceptable for Xpert Xpress Flu testing.   Basic metabolic panel     Status: Abnormal   Collection Time: 02/09/17  5:42 AM  Result Value Ref Range   Sodium 136 135 - 145 mmol/L   Potassium 4.1 3.5 - 5.1 mmol/L   Chloride 114 (H) 101 - 111 mmol/L   CO2 16 (L) 22 - 32 mmol/L   Glucose, Bld 77 65 - 99 mg/dL   BUN 16 6 - 20 mg/dL   Creatinine, Ser 1.01 (H) 0.44 - 1.00 mg/dL   Calcium 7.3 (L) 8.9 - 10.3 mg/dL   GFR calc non Af Amer >60 >60 mL/min   GFR calc Af Amer >60 >60 mL/min     Comment: (NOTE) The eGFR has been calculated using the CKD EPI equation. This calculation has not been validated in all clinical situations. eGFR's persistently <60 mL/min signify possible Chronic Kidney Disease.    Anion gap 6 5 - 15    Comment: Performed at [facility], [place] [address], [place], [place] [address]  CBC     Status: Abnormal   Collection Time: 02/09/17  5:42 AM  Result Value Ref Range   WBC 1.9 (L) 4.0 - 10.5 K/uL   RBC 3.14 (L) 3.87 - 5.11 MIL/uL   Hemoglobin 9.1 (L) 12.0 - 15.0 g/dL   HCT 26.5 (L) 36.0 - 46.0 %   MCV 84.4 78.0 - 100.0 fL   MCH 29.0 26.0 - 34.0 pg   MCHC 34.3 30.0 - 36.0 g/dL   RDW 15.3 11.5 - 15.5 %   Platelets 27 (LL) 150 - 400  K/uL    Comment: CRITICAL VALUE NOTED.  VALUE IS CONSISTENT WITH PREVIOUSLY REPORTED AND CALLED VALUE. CONSISTENT WITH PREVIOUS RESULT   Brain natriuretic peptide     Status: Abnormal   Collection Time: 02/09/17  5:42 AM  Result Value Ref Range   B Natriuretic Peptide 132.2 (H) 0.0 - 100.0 pg/mL    Comment: Performed at [facility], [place] [address], [place], [place] [address]   *Note: Due to a large number of results and/or encounters for the requested time period, some results have not been displayed. A complete set of results can be found in Results Review.   Dg Chest 2 View  Result Date: 02/07/2017 CLINICAL DATA:  Metastatic breast cancer post mastectomy currently on chemotherapy. Shortness of breath and cough. EXAM: CHEST  2 VIEW COMPARISON:  09/05/2016 FINDINGS: Lungs are hypoinflated with mild bibasilar opacification and hazy prominence of the central perihilar markings. Findings are most concerning for infection with possible component of vascular congestion. No effusion. Cardiomediastinal silhouette and remainder of the exam is unchanged. IMPRESSION: Bibasilar airspace process likely infection. Possible component of vascular congestion. Electronically Signed   By: Marin Olp M.D.   On: 01/09/2017 20:13    Pending Labs Unresulted Labs (From admission, onward)   Start     Ordered   01/26/2017 2158  Culture, sputum-assessment  Once,   R     02/07/2017 2200   01/13/2017 2158  Gram stain  Once,   R     01/13/2017 2200   01/19/2017 2158  Legionella Pneumophila Serogp 1 Ur Ag  Once,   R     01/28/2017 2200   02/03/2017 2057  Blood Culture (routine x 2)  BLOOD CULTURE X 2,   STAT     01/23/2017 2057   01/21/2017 1948  Urine Culture  STAT,   STAT     02/06/2017 1947      Vitals/Pain Today's Vitals   02/09/17 1230 02/09/17 1300 02/09/17 1330 02/09/17 1400  BP: 122/69 132/77 (!) 141/74 (!) 145/76  Pulse: 100 (!) 113 (!) 112 (!) 112  Resp: (!) 34 (!) 38 (!) 38 (!) 48  Temp:      TempSrc:      SpO2: 99% 98% 96% 97%  Weight:      Height:        Isolation Precautions Droplet precaution  Medications Medications  gabapentin (NEURONTIN) capsule 300 mg (300 mg Oral Given 01/16/2017 2335)  levETIRAcetam (KEPPRA) tablet 500 mg (500 mg Oral Given 02/09/17 1033)  multivitamin with minerals tablet 1 tablet (1 tablet Oral Given 02/09/17 1033)  polyethylene glycol (MIRALAX / GLYCOLAX) packet 17 g (17 g Oral Given 02/09/17 1033)  acetaminophen (TYLENOL) tablet 650 mg (not administered)    Or  acetaminophen (TYLENOL) suppository 650 mg (not administered)  ondansetron (ZOFRAN) tablet 4 mg (not administered)    Or  ondansetron (ZOFRAN) injection 4 mg (not administered)  0.9 %  sodium chloride infusion ( Intravenous New Bag/Given 02/09/17 0110)  azithromycin (ZITHROMAX) 500 mg in dextrose 5 % 250 mL IVPB (0 mg Intravenous Stopped 02/09/17 0109)  ceFEPIme (MAXIPIME) 1 g in dextrose 5 % 50 mL IVPB (0 g Intravenous Stopped 02/09/17 1135)  vancomycin (VANCOCIN) IVPB 750 mg/150 ml premix (not administered)  sodium chloride 0.9 % bolus 1,000 mL (0 mLs Intravenous Stopped 02/07/2017 2217)  sodium chloride 0.9 % bolus 1,000 mL (0 mLs Intravenous Stopped 01/10/2017 2246)    And  sodium chloride 0.9 % bolus  500 mL (0 mLs Intravenous Stopped 01/16/2017 2245)    And  sodium chloride 0.9 % bolus 250 mL (0 mLs Intravenous Stopped 02/02/2017 2246)  ceFEPIme (MAXIPIME) 2 g in dextrose 5 % 50 mL IVPB (0 g Intravenous Stopped 02/06/2017 2216)  vancomycin (VANCOCIN) IVPB 1000 mg/200 mL premix (0 mg Intravenous Stopped 02/06/2017 2336)    Mobility manual wheelchair

## 2017-02-09 NOTE — ED Notes (Signed)
Bed: PN36 Expected date:  Expected time:  Means of arrival:  Comments: Room 4

## 2017-02-09 NOTE — Progress Notes (Signed)
Patient noted to be tachypnic, SOB and labored breathing   with 6L of oxygen and at rest, breathing treatment given without any changes. PRN morphine given and on call provider B[place] notifies, orders written and planing  On transferring patient as well, will continue to assess patient. Family at the bedside-updated.

## 2017-02-09 DEATH — deceased

## 2017-02-10 ENCOUNTER — Other Ambulatory Visit: Payer: Self-pay

## 2017-02-10 DIAGNOSIS — R0602 Shortness of breath: Secondary | ICD-10-CM

## 2017-02-10 DIAGNOSIS — Z515 Encounter for palliative care: Secondary | ICD-10-CM

## 2017-02-10 DIAGNOSIS — Z7189 Other specified counseling: Secondary | ICD-10-CM

## 2017-02-10 LAB — CBC WITH DIFFERENTIAL/PLATELET
BASOS PCT: 5 %
Basophils Absolute: 0.2 10*3/uL — ABNORMAL HIGH (ref 0.0–0.1)
EOS ABS: 0.1 10*3/uL (ref 0.0–0.7)
Eosinophils Relative: 2 %
HEMATOCRIT: 22.3 % — AB (ref 36.0–46.0)
Hemoglobin: 7.6 g/dL — ABNORMAL LOW (ref 12.0–15.0)
LYMPHS PCT: 32 %
Lymphs Abs: 1.1 10*3/uL (ref 0.7–4.0)
MCH: 29.5 pg (ref 26.0–34.0)
MCHC: 34.1 g/dL (ref 30.0–36.0)
MCV: 86.4 fL (ref 78.0–100.0)
MONO ABS: 0.5 10*3/uL (ref 0.1–1.0)
MONOS PCT: 15 %
NEUTROS ABS: 1.5 10*3/uL — AB (ref 1.7–7.7)
NRBC: 20 /100{WBCs} — AB
Neutrophils Relative %: 46 %
PLATELETS: 19 10*3/uL — AB (ref 150–400)
RBC: 2.58 MIL/uL — ABNORMAL LOW (ref 3.87–5.11)
RDW: 15.6 % — ABNORMAL HIGH (ref 11.5–15.5)
WBC: 3.4 10*3/uL — ABNORMAL LOW (ref 4.0–10.5)

## 2017-02-10 LAB — BASIC METABOLIC PANEL
Anion gap: 6 (ref 5–15)
BUN: 15 mg/dL (ref 6–20)
CHLORIDE: 108 mmol/L (ref 101–111)
CO2: 16 mmol/L — AB (ref 22–32)
Calcium: 6 mg/dL — CL (ref 8.9–10.3)
Creatinine, Ser: 1.25 mg/dL — ABNORMAL HIGH (ref 0.44–1.00)
GFR calc Af Amer: 58 mL/min — ABNORMAL LOW (ref >60)
GFR calc non Af Amer: 50 mL/min — ABNORMAL LOW (ref >60)
GLUCOSE: 344 mg/dL — AB (ref 65–99)
Potassium: 4.4 mmol/L (ref 3.5–5.1)
Sodium: 130 mmol/L — ABNORMAL LOW (ref 135–145)

## 2017-02-10 LAB — URINE CULTURE

## 2017-02-10 LAB — LEGIONELLA PNEUMOPHILA SEROGP 1 UR AG: L. pneumophila Serogp 1 Ur Ag: NEGATIVE

## 2017-02-10 MED ORDER — PHENYLEPHRINE HCL-NACL 10-0.9 MG/250ML-% IV SOLN
INTRAVENOUS | Status: AC
  Filled 2017-02-10: qty 250

## 2017-02-10 MED ORDER — MORPHINE SULFATE (PF) 4 MG/ML IV SOLN
2.0000 mg | INTRAVENOUS | Status: DC | PRN
  Administered 2017-02-10 – 2017-02-11 (×4): 2 mg via INTRAVENOUS
  Filled 2017-02-10 (×5): qty 1

## 2017-02-10 MED ORDER — SODIUM CHLORIDE 0.9 % IV BOLUS (SEPSIS)
1000.0000 mL | Freq: Once | INTRAVENOUS | Status: AC
  Administered 2017-02-10: 1000 mL via INTRAVENOUS

## 2017-02-10 MED ORDER — LORAZEPAM 2 MG/ML IJ SOLN
1.0000 mg | INTRAMUSCULAR | Status: DC | PRN
  Administered 2017-02-10 – 2017-02-11 (×4): 1 mg via INTRAVENOUS
  Filled 2017-02-10 (×4): qty 1

## 2017-02-10 MED ORDER — PHENYLEPHRINE HCL-NACL 10-0.9 MG/250ML-% IV SOLN
0.0000 ug/min | INTRAVENOUS | Status: DC
  Administered 2017-02-10: 60 ug/min via INTRAVENOUS
  Administered 2017-02-10: 50 ug/min via INTRAVENOUS
  Administered 2017-02-10: 20 ug/min via INTRAVENOUS
  Administered 2017-02-10: 110 ug/min via INTRAVENOUS
  Administered 2017-02-10: 120 ug/min via INTRAVENOUS
  Administered 2017-02-10: 140 ug/min via INTRAVENOUS
  Administered 2017-02-10: 65 ug/min via INTRAVENOUS
  Administered 2017-02-10: 130 ug/min via INTRAVENOUS
  Administered 2017-02-10: 90 ug/min via INTRAVENOUS
  Administered 2017-02-10: 140 ug/min via INTRAVENOUS
  Filled 2017-02-10 (×9): qty 250

## 2017-02-10 MED ORDER — MORPHINE SULFATE (PF) 4 MG/ML IV SOLN
2.0000 mg | INTRAVENOUS | Status: DC | PRN
  Administered 2017-02-10 (×2): 2 mg via INTRAVENOUS
  Filled 2017-02-10: qty 1

## 2017-02-10 NOTE — Progress Notes (Signed)
CRITICAL VALUE ALERT  Critical Value:  Calcium 6.0  Date & Time Notied:  02/10/2017  1610  Provider Notified: Denton Brick  Orders Received/Actions taken: MD paged. No new orders at this time.

## 2017-02-10 NOTE — Consult Note (Signed)
Palliative care progress note  Reason for consult: Goals of care in light of respiratory failure and metastatic breast cancer  Patient is a 46-year-old female with past medical history of metastatic breast cancer who was admitted with concern for a pneumonia  that has been further complicated by respiratory arrest and hypotension requiring BiPAP and pressor support.  Carolyn [name] is unresponsive.  She is on BiPAP and tachypnic but otherwise appears comfortable.  She appears to be actively dying.  I met today with patient's sisters, including Carolyn (HCPOA).    Her sisters report that Carolyn [name] is a highly religious person and has placed her faith in God to carry her through her illness.  She has family that when it is her time to die, she is at peace with this and wants to focus on remaining as comfortable as possible throughout that process.  We discussed clinical course as well as wishes moving forward in regard to advanced directives.  Concepts specific to code status and care this hospitalization discussed.  We discussed difference between a aggressive medical intervention path and a palliative, comfort focused care path.    Carolyn reports today that the goal moving forward is to ensure that Carolyn [name] is comfortable.  There does seem to be a small amount of reservation regarding increasing the amount of opioids she is receiving.  We discussed increasing the frequency of medication rather than the dose so that she can receive more frequently if her symptoms dictate that this would be necessary to maintain her comfort.  I increased morphine to 2mg Q 1 hr as needed  They have a sister who is traveling in from [place] today.  Family wants to continue with current interventions, but with no escalation of care while awaiting for their other sister to arrive from in town.  We discussed the reason for continuation of BiPAP and neo-at this time is to hopefully allow family the opportunity to come in town to visit  with her before she passes.  We also discussed that once family has had time to come, she may no longer be receiving benefit from continuing with BiPAP or pressor support.  Her sister came reports that she understands this and they will continue to discuss as a family regarding the appropriate time to discontinue these interventions.  She is agreeable to me following up with them tomorrow.  Carolyn did ask about possibility of pursuing placement at [facility], however, I shared with her that I think it is most likely that she would not be stable enough to consider transport and will likely die shortly after withdrawal intensive interventions.  We did discuss, however, that if she does stabilize enough to consider transition from the hospital, this would be an excellent idea for end-of-life care with her goals in mind.  Questions and concerns addressed.   PMT will continue to support holistically.  Total time: 60 Greater than 50%  of this time was spent counseling and coordinating care related to the above assessment and plan.   , MD [facility] Team [phone]  

## 2017-02-10 NOTE — Progress Notes (Signed)
   02/10/17 1400  Clinical Encounter Type  Visited With Patient and family together;Health care provider  Visit Type Initial;Critical Care;Spiritual support  Referral From Nurse  Spiritual Encounters  Spiritual Needs Prayer;Emotional;Grief support  Stress Factors  Patient Stress Factors None identified  Family Stress Factors Exhausted;Loss of control;Major life changes  Advance Directives (For Healthcare)  Does Patient Have a Medical Advance Directive? Yes  Type of Advance Directive Healthcare Power of [place]   Name: Carolyn [name], Carolyn [name]  Location: ICU [facility] [address] of Visit: END of live/ Comfort care/ Pastoral visit  Chaplain was called to provide emotional and spiritual support to Pt's Family. When chaplain arrived, older sister was at bedside. Chaplain shared some time on the phone with middle sister. Topics continued. Loss, grief, reconciliation between family members, and honoring the wishes of Pt. It has been reported (during the pastoral care visit)  that the Pt has extra support via church family. Pt's pastor visited this morning. Family seems to be grieving "appropriately" and are in the progress of making arrangements for Pt.  Sisters and father stated, "We just want her to be comfortable." Chaplain listened to stories and memories, which were told by older sister. Chaplain prayed and concluded the visit, however if this family seems to be in more need of emotional support please reach out [phone].   Thanks,  Erick Blinks

## 2017-02-10 NOTE — Progress Notes (Signed)
Patient Demographics:    Carolyn [name], is a 47 y.o. female, DOB - 1970/12/24, DPO:[id number]  Admit date - 01/23/2017   Admitting Physician Rise Patience, MD  Outpatient Primary MD for the patient is Default, Provider, MD  LOS - 2   Chief Complaint  Patient presents with  . Respiratory Distress  . Cough  . 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 [name], patient has agreed to Partial Code (no CPR, no intubation), extensive conversations on 02/10/2017 with patient sister from [place] and patient's POA Carolyn [name] are both at bedside,, okay to use as needed lorazepam and morphine sulfate for comfort.  Apparently another sister from [place] is on the way and she should be here later  on 02/10/2017.  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 [name] appreciated, overall prognosis is [place]  Code Status :  Partial Code (no CPR, no intubation), okay to use BiPAP, okay to use ACLS medications  Disposition Plan  : TBD (patient appears to be actively dying)  Consults  : Oncology  DVT Prophylaxis  :    SCDs   Lab Results  Component Value Date   PLT 19 (LL) 02/10/2017    Inpatient Medications  Scheduled Meds: . gabapentin  300 mg Oral QHS  . ipratropium-albuterol  3 mL Nebulization Q4H  . multivitamin with minerals  1 tablet Oral Daily  . polyethylene glycol  17 g Oral Daily   Continuous Infusions: . azithromycin Stopped (02/09/17 2216)  . ceFEPime (MAXIPIME) IV Stopped (02/10/17 0930)  . chlorproMAZINE (THORAZINE) IV    . dextrose 75 mL/hr at 02/10/17 0500  . phenylephrine (NEO-SYNEPHRINE) Adult infusion 90 mcg/min (02/10/17 0958)  . vancomycin Stopped (02/09/17 2333)   PRN Meds:.acetaminophen **OR** acetaminophen, albuterol, alum & mag hydroxide-simeth, antiseptic oral rinse, chlorproMAZINE (THORAZINE) IV, diphenhydrAMINE, glycopyrrolate **OR** glycopyrrolate **OR** glycopyrrolate, LORazepam, morphine injection, ondansetron **OR** ondansetron (ZOFRAN)  IV, polyvinyl alcohol   Anti-infectives (From admission, onward)   Start     Dose/Rate Route Frequency Ordered Stop   02/09/17 2200  vancomycin (VANCOCIN) IVPB 750 mg/150 ml premix     750 mg 150 mL/hr over 60 Minutes Intravenous Every 24 hours 02/09/17 0725     01/31/2017 2230  azithromycin (ZITHROMAX) 500 mg  in dextrose 5 % 250 mL IVPB     500 mg 250 mL/hr over 60 Minutes Intravenous Daily at bedtime 01/25/2017 2200     01/16/2017 2230  ceFEPIme (MAXIPIME) 1 g in dextrose 5 % 50 mL IVPB     1 g 100 mL/hr over 30 Minutes Intravenous 2 times daily 01/16/2017 2200 02/16/17 2159   01/25/2017 2100  ceFEPIme (MAXIPIME) 2 g in dextrose 5 % 50 mL IVPB     2 g 100 mL/hr over 30 Minutes Intravenous  Once 02/07/2017 2057 01/27/2017 2216   02/07/2017 2100  vancomycin (VANCOCIN) IVPB 1000 mg/200 mL premix     1,000 mg 200 mL/hr over 60 Minutes Intravenous  Once 01/31/2017 2057 02/04/2017 2336        Objective:   Vitals:   02/10/17 1045 02/10/17 1100 02/10/17 1105 02/10/17 1146  BP: (!) 111/52 (!) 106/55    Pulse: (!) 108 (!) 108    Resp: (!) 33 (!) 34  (!) 35  Temp:   98 F (36.7 C)   TempSrc:   Axillary   SpO2: 100% 100%  100%  Weight:      Height:        Wt Readings from Last 3 Encounters:  01/15/2017 50.8 kg (112 lb)  02/07/17 50.8 kg (112 lb)  02/06/17 51 kg (112 lb 8 oz)     Intake/Output Summary (Last 24 hours) at 02/10/2017 1214 Last data filed at 02/10/2017 0547 Gross per 24 hour  Intake 2446.25 ml  Output 500 ml  Net 1946.25 ml    Physical Exam  Gen:-Less restless, resting better after IV morphine and lorazepam HEENT:- [place].AT, Bipap Mask Lungs-diminished breath sounds with scattered rhonchi bilaterally CV- S1, S2 normal Abd-  +ve B.Sounds, Abd Soft, No tenderness,    Extremity/Skin:- No  edema,    Psych-sleepy after morphine sulfate and Ativan,   Data Review:   Micro Results Recent Results (from the past 240 hour(s))  Urine Culture     Status: Abnormal   Collection Time: 02/06/2017  8:58 PM  Result Value Ref Range Status   Specimen Description   Final    URINE, RANDOM Performed at [facility] [address], [place], [place] [address]    Special Requests   Final    NONE Performed at [facility], [place] [address], [place], [place] [address]     Culture MULTIPLE SPECIES PRESENT, SUGGEST RECOLLECTION (A)  Final   Report Status 02/10/2017 FINAL  Final  Blood Culture (routine x 2)     Status: None (Preliminary result)   Collection Time: 01/23/2017  9:15 PM  Result Value Ref Range Status   Specimen Description   Final    BLOOD LEFT FOREARM Performed at [facility] [address] [place], [place] [address]    Special Requests   Final    IN PEDIATRIC BOTTLE Blood Culture adequate volume Performed at [facility] [address], [place], [place] [address]    Culture   Final    NO GROWTH 1 DAY Performed at [facility], [place]  [address], [place], [place] [address]    Report Status PENDING  Incomplete  Blood Culture (routine x 2)     Status: None (Preliminary result)   Collection Time: 01/14/2017  9:19 PM  Result Value Ref Range Status   Specimen Description   Final    BLOOD LEFT ANTECUBITAL Performed at [facility] [address] [place], [place] [address]    Special Requests   Final    BOTTLES DRAWN AEROBIC AND ANAEROBIC Blood Culture adequate volume Performed at [facility] [address], [place], [place] [address]    Culture   Final    NO GROWTH 1 DAY Performed at [facility], [place] [address], [place], [place] [address]    Report Status PENDING  Incomplete    Radiology Reports Dg Chest 2 View  Result Date: 02/04/2017 CLINICAL DATA:  Metastatic breast cancer post mastectomy currently on chemotherapy. Shortness of breath and cough. EXAM: CHEST  2 VIEW COMPARISON:  09/05/2016 FINDINGS: Lungs are hypoinflated with mild bibasilar opacification and hazy prominence of the central perihilar markings. Findings are most concerning for infection with possible component of vascular congestion. No effusion. Cardiomediastinal silhouette and remainder of the exam is unchanged. IMPRESSION: Bibasilar airspace process likely infection. Possible  component of vascular congestion. Electronically Signed   By: Marin Olp M.D.   On: 01/26/2017 20:13   Mr Jeri Cos JI Contrast  Result Date: 01/25/2017 CLINICAL DATA:  47 year old female with metastatic breast cancer. Completed stereotactic radiosurgery to a left parietal lesion on 04/29/2014 to a dose of 20 gray, followed by auto-litt procedure (laser ablation) November 2016. Small right parietal lobe metastasis discovered on MRI in November 2017 status post SRS on 12/1415 to a dose of 20 Gy. Osseous metastatic disease. EXAM: MRI HEAD WITHOUT AND WITH CONTRAST TECHNIQUE: Multiplanar, multiecho pulse sequences of the brain and surrounding structures were obtained without and with intravenous contrast. CONTRAST:  65mL MULTIHANCE GADOBENATE DIMEGLUMINE 529 MG/ML IV SOLN COMPARISON:  10/19/2016 and earlier. FINDINGS: Brain: Stable mixed T1 signal, dark T2 signal left periatrial treated lesion (series 5, image 17 and series 10, image 100) with only faint adjacent FLAIR hyperintensity and no regional mass effect. Enlargement of the treated right inferior parietal metastasis which has increased from 4 millimeters to 12-13 millimeters largest dimension as seen on series 10, image 88. Furthermore, confluent surrounding T2 and FLAIR hyperintensity has developed since October with mild regional mass effect suggesting vasogenic edema. See series 10, image 88 and series 7, image 29. Furthermore, the lesion now demonstrates confluent hemosiderin on T2* imaging (series 6, image 16) which is new since July 2018, and only punctate blood products were evident in October. Subsequently, the enhancing portion of the lesion is almost completely dark on T2 weighted imaging (series 5, image 16). Furthermore, the pituitary and pituitary infundibulum have become abnormal, and are now thickened and lobulated with hyperenhancement. Precontrast T1 weighted images suggest the presence of a T1 hypointense enhancing metastasis occupying the  posterior aspect of the sella and compressing the remainder of the gland the hypothalamus remains normal gland anteriorly (series 2, image 19). There is associated abnormal hypothalamic T2 and FLAIR hyperintensity compatible with edema (series 7, image 25). The cavernous sinus appears to remain normal. Small new 5 millimeter enhancing metastasis in the left inferior occipital lobe has developed since October with mild surrounding edema but no significant mass effect. See series 10, image 53. No other abnormal intracranial enhancement identified. No dural thickening. No ventriculomegaly  or midline shift. No restricted diffusion or evidence of acute infarction. No acute intracranial hemorrhage identified. Stable gray and white matter signal elsewhere. Negative cervicomedullary junction. Vascular: Major intracranial vascular flow voids are stable. Skull and upper cervical spine: Diffusely abnormal bone marrow signal in keeping with progressive osseous metastatic disease since July 2017. There is a chronic left temporal bone bone lesion near the semicircular canals (series 5, image 8) which has been stable since 2,016 and is favored to be benign. No definite abnormality of the visible cervical spinal cord. Sinuses/Orbits: Normal orbits soft tissues. Negative paranasal sinuses. Other: Visible internal auditory structures appear normal. Mastoids remain clear. No scalp or face soft tissue abnormality identified. IMPRESSION: 1. Progression of disease since 10/19/2016. 2. 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 Associated hypothalamic edema without significant regional mass effect. - 5 millimeter left inferior occipital lobe. Mild edema with no mass effect. 3. Enlargement of the treated right inferior parietal lobe metastasis from 4 mm now to 12-13 mm with new confluent vasogenic edema and mild regional mass effect. However, the appearance is complicated by evidence of interval hemorrhage within the  lesion. Still in light of #2, the appearance is suspicious for true progression. 4. Progressive osseous metastatic disease since July 2018. It is possible that the pituitary involvement is via contiguous spread from the skull base. 5. The treated left periatrial lesion remains stable. Electronically Signed   By: Genevie Ann M.D.   On: 01/25/2017 11:18   Dg Chest Port 1 View  Result Date: 02/09/2017 CLINICAL DATA:  Increasing shortness of breath and fever this evening. Currently being treated for stage IV breast cancer. EXAM: PORTABLE CHEST 1 VIEW COMPARISON:  01/26/2017 FINDINGS: Postoperative changes in the midthoracic spine. Surgical clips in the right axilla. Shallow inspiration. Since the previous study, there is increasing airspace disease diffusely within both lungs. Distribution is most prominent in the perihilar regions. No pleural effusions. No pneumothorax. Heart size appears normal. IMPRESSION: Progression of bilateral airspace disease since previous study. Changes may reflect edema or multifocal pneumonia. Electronically Signed   By: Lucienne Capers M.D.   On: 02/09/2017 21:48     CBC Recent Labs  Lab 02/06/17 1328 02/07/2017 2020 02/09/17 0542 02/10/17 0900  WBC 1.8* 2.0* 1.9* 3.4*  HGB  --  10.9* 9.1* 7.6*  HCT 18.2* 31.6* 26.5* 22.3*  PLT 15* 29* 27* 19*  MCV 86.7 84.3 84.4 86.4  MCH 29.3 29.1 29.0 29.5  MCHC 33.8 34.5 34.3 34.1  RDW 14.9 15.4 15.3 15.6*  LYMPHSABS 0.8* 0.9  --  1.1  MONOABS 0.1 0.2  --  0.5  EOSABS 0.0 0.0  --  0.1  BASOSABS 0.0 0.0  --  0.2*    Chemistries  Recent Labs  Lab 02/06/17 1328 01/10/2017 2020 02/09/17 0542 02/10/17 0900  NA 137 139 136 130*  K 4.7 4.8 4.1 4.4  CL 109 112* 114* 108  CO2 18* 19* 16* 16*  GLUCOSE 76 82 77 344*  BUN 30* 19 16 15   CREATININE  --  1.17* 1.01* 1.25*  CALCIUM 8.8 8.2* 7.3* 6.0*  AST 55* 58*  --   --   ALT 39 34  --   --   ALKPHOS 168* 167*  --   --   BILITOT 0.7 0.5  --   --     ------------------------------------------------------------------------------------------------------------------ No results for input(s): CHOL, HDL, LDLCALC, TRIG, CHOLHDL, LDLDIRECT in the last 72 hours.  No results found for: HGBA1C ------------------------------------------------------------------------------------------------------------------ No  results for input(s): TSH, T4TOTAL, T3FREE, THYROIDAB in the last 72 hours.  Invalid input(s): FREET3 ------------------------------------------------------------------------------------------------------------------ No results for input(s): VITAMINB12, FOLATE, FERRITIN, TIBC, IRON, RETICCTPCT in the last 72 hours.  Coagulation profile No results for input(s): INR, PROTIME in the last 168 hours.  No results for input(s): DDIMER in the last 72 hours.  Cardiac Enzymes No results for input(s): CKMB, TROPONINI, MYOGLOBIN in the last 168 hours.  Invalid input(s): CK ------------------------------------------------------------------------------------------------------------------    Component Value Date/Time   BNP 132.2 (H) 02/09/2017 0981    Roxan Hockey M.D on 02/10/2017 at 12:14 PM  Between 7am to 7pm - Pager - [phone]  After 7pm go to [email or web address] - password [email or web address]  [facility] -  Office  [phone]   Voice Recognition Viviann Spare dictation system was used to create this note, attempts have been made to correct errors. Please contact the author with questions and/or clarifications.

## 2017-02-11 DIAGNOSIS — C50919 Malignant neoplasm of unspecified site of unspecified female breast: Secondary | ICD-10-CM

## 2017-02-12 ENCOUNTER — Other Ambulatory Visit: Payer: Self-pay | Admitting: Oncology

## 2017-02-12 ENCOUNTER — Ambulatory Visit: Payer: BC Managed Care – PPO

## 2017-02-12 ENCOUNTER — Ambulatory Visit: Payer: BC Managed Care – PPO | Admitting: Oncology

## 2017-02-12 ENCOUNTER — Other Ambulatory Visit: Payer: BC Managed Care – PPO

## 2017-02-14 ENCOUNTER — Ambulatory Visit: Payer: BC Managed Care – PPO | Admitting: Oncology

## 2017-02-14 ENCOUNTER — Ambulatory Visit: Payer: BC Managed Care – PPO

## 2017-02-14 ENCOUNTER — Other Ambulatory Visit: Payer: BC Managed Care – PPO

## 2017-02-14 LAB — CULTURE, BLOOD (ROUTINE X 2)
CULTURE: NO GROWTH
Culture: NO GROWTH
SPECIAL REQUESTS: ADEQUATE
Special Requests: ADEQUATE

## 2017-02-19 ENCOUNTER — Ambulatory Visit: Payer: BC Managed Care – PPO | Admitting: Oncology

## 2017-02-19 ENCOUNTER — Ambulatory Visit: Payer: BC Managed Care – PPO

## 2017-02-19 ENCOUNTER — Other Ambulatory Visit: Payer: BC Managed Care – PPO

## 2017-02-19 NOTE — Progress Notes (Signed)
  Radiation Oncology         [phone] ________________________________  Name: Carolyn [name] MRN: [id number]  Date: 02/07/2017  DOB: 02-16-1970  End of Treatment Note  Diagnosis:   47 y.o. 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 PTV3: 5 mm left inferior occipital metastasis / 20 Gy in 1 fraction 2. 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   There were no signs of acute toxicity after treatment.  Plan: The patient has completed radiation treatment. The patient will return to radiation oncology clinic for routine followup in one month. I advised the patient to call or return sooner if they have any questions or concerns related to their recovery or treatment. ________________________________  [name], MD, PhD  This document serves as a record of services personally performed by Kyung Rudd, MD. It was created on his behalf by Rae Lips, a trained medical scribe. The creation of this record is based on the scribe's personal observations and the provider's statements to them. This document has been checked and approved by the attending provider.

## 2017-02-26 ENCOUNTER — Ambulatory Visit: Payer: BC Managed Care – PPO

## 2017-02-26 ENCOUNTER — Other Ambulatory Visit: Payer: BC Managed Care – PPO

## 2017-02-26 ENCOUNTER — Ambulatory Visit: Payer: BC Managed Care – PPO | Admitting: Oncology

## 2017-03-09 NOTE — Discharge Summary (Signed)
Carolyn [name], is a 47 y.o. female  DOB 02-06-1970  MRN [id number].  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 Breast cancer ([place]) 09/2011   invasive ductal carcinoma metastatic ca in 3/14 lymph nodes  . History of chemotherapy 06/27/11 -09/04/11   neoadjuvant  . Hx of radiation therapy 11/08/11 -12/26/11   right chest wall/supraclav fossa, right scar  . S/P radiation therapy 05/08/14   SRS brain    Past Surgical History:  Procedure Laterality Date  . brain surgery-LITT Left 11/20/14   LITT procedure for Lt brain met.  Marland Kitchen BREAST BIOPSY     Left  . CHOLECYSTECTOMY N/A 03/01/2014   Procedure: LAPAROSCOPIC CHOLECYSTECTOMY;  Surgeon: Leighton Ruff, MD;  Location: [facility];  Service: General;  Laterality: N/A;  . LAMINECTOMY N/A 04/13/2014   Procedure: THORACIC LAMINECTOMY WITH FIXATION THORACIC SIX-THORACIC TEN FUSION;  Surgeon: Kristeen Miss, MD;  Location: [facility];  Service: Neurosurgery;  Laterality: N/A;  . MODIFIED MASTECTOMY  10/03/2011   Procedure: MODIFIED MASTECTOMY;  Surgeon: Haywood Lasso, MD;  Location: [facility];  Service: General;  Laterality: Right;  . PORT-A-CATH REMOVAL  01/30/2012   Procedure: MINOR REMOVAL PORT-A-CATH;  Surgeon: Haywood Lasso, MD;  Location: [facility];  Service: General;  Laterality: Left;  . PORTACATH PLACEMENT  06/20/2011   Procedure: INSERTION PORT-A-CATH;  Surgeon: Haywood Lasso, MD;  Location: [facility];  Service: General;  Laterality: N/A;  . ROBOTIC ASSISTED TOTAL HYSTERECTOMY WITH BILATERAL SALPINGO OOPHERECTOMY Bilateral 12/23/2012   Procedure: ROBOTIC ASSISTED TOTAL HYSTERECTOMY WITH BILATERAL SALPINGO OOPHORECTOMY;  Surgeon: Marvene Staff, MD;  Location: [facility];  Service: Gynecology;  Laterality: Bilateral;  . WISDOM TOOTH EXTRACTION         HPI  from the history and physical done on the day of admission:     Chief Complaint: Cough and shortness of breath.  HPI: Carolyn [name] is a 47 y.o. female with history of metastatic breast cancer who presents to the ER with complaints of cough and shortness of breath.  Patient has been having the symptoms for last 2 days.  Denies any fever chills.  Cough is usually nonproductive unable to bring out anything.  Patient also was feeling fatigued.  Last chemotherapy as per the patient was 2 weeks ago.  ED Course: In the ER patient was mildly febrile labs revealed pancytopenia.  Chest x-ray was showing possible infiltrates concerning for pneumonia.  Patient is started on antibiotics after blood cultures obtained influenza PCR is pending.    Hospital Course:     Patient expired at 0850 am on 02/16/2017  1)Presumed sepsis with septic shock secondary to pneumonia-  Immunocompromised pt due to underlying malignancy and chemotherapy treatments, despite breathing treatments, BiPAP use, despite antibiotics including vancomycin/cefepime/azithromycin started on 01/22/2017, patient condition deteriorated.  Patient required Neo-Synephrine for pressure support.  Despite aggressive treatment protocols patient continued to decline.  after conversations with palliative care team, family decided on comfort measures, Patients expired at 0850  2)H/o SZ- after conversations with palliative care  team, family decided on comfort measures, Patients expired at 0850  3)Social/Ethics- extensive conversations on 02/10/2017 with patient sister from [place] and patient's POA Maudie Mercury are both at bedside,,  palliative care consult appreciated, oncology input from Dr. 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 Shortness of breath and cough. EXAM: CHEST  2 VIEW COMPARISON:  09/05/2016 FINDINGS: Lungs are hypoinflated with mild bibasilar opacification and hazy prominence of the central perihilar markings. Findings are most concerning for infection with possible component of vascular congestion. No effusion. Cardiomediastinal silhouette and remainder of the exam is unchanged. IMPRESSION: Bibasilar airspace process likely infection. Possible component of vascular congestion. Electronically Signed   By: Marin Olp M.D.   On: 02/03/2017 20:13   Mr Jeri Cos UU Contrast  Result Date: 01/25/2017 CLINICAL DATA:  47 year old female with metastatic breast cancer. Completed stereotactic  radiosurgery to a left parietal lesion on 04/29/2014 to a dose of 20 gray, followed by auto-litt procedure (laser ablation) November 2016. Small right parietal lobe metastasis discovered on MRI in November 2017 status post SRS on 12/1415 to a dose of 20 Gy. Osseous metastatic disease. EXAM: MRI HEAD WITHOUT AND WITH CONTRAST TECHNIQUE: Multiplanar, multiecho pulse sequences of the brain and surrounding structures were obtained without and with intravenous contrast. CONTRAST:  44m MULTIHANCE GADOBENATE DIMEGLUMINE 529 MG/ML IV SOLN COMPARISON:  10/19/2016 and earlier. FINDINGS: Brain: Stable mixed T1 signal, dark T2 signal left periatrial treated lesion (series 5, image 17 and series 10, image 100) with only faint adjacent FLAIR hyperintensity and no regional mass effect. Enlargement of the treated right inferior parietal metastasis which has increased from 4 millimeters to 12-13 millimeters largest dimension as seen on series 10, image 88. Furthermore, confluent surrounding T2 and FLAIR hyperintensity has developed since October with mild regional mass effect suggesting vasogenic edema. See series 10, image 88 and series 7, image 29. Furthermore, the lesion now demonstrates confluent hemosiderin on T2* imaging (series 6, image 16) which is new since July 2018, and only punctate  blood products were evident in October. Subsequently, the enhancing portion of the lesion is almost completely dark on T2 weighted imaging (series 5, image 16). Furthermore, the pituitary and pituitary infundibulum have become abnormal, and are now thickened and lobulated with hyperenhancement. Precontrast T1 weighted images suggest the presence of a T1 hypointense enhancing metastasis occupying the posterior aspect of the sella and compressing the remainder of the gland the hypothalamus remains normal gland anteriorly (series 2, image 19). There is associated abnormal hypothalamic T2 and FLAIR hyperintensity compatible with edema (series 7,  image 25). The cavernous sinus appears to remain normal. Small new 5 millimeter enhancing metastasis in the left inferior occipital lobe has developed since October with mild surrounding edema but no significant mass effect. See series 10, image 53. No other abnormal intracranial enhancement identified. No dural thickening. No ventriculomegaly or midline shift. No restricted diffusion or evidence of acute infarction. No acute intracranial hemorrhage identified. Stable gray and white matter signal elsewhere. Negative cervicomedullary junction. Vascular: Major intracranial vascular flow voids are stable. Skull and upper cervical spine: Diffusely abnormal bone marrow signal in keeping with progressive osseous metastatic disease since July 2017. There is a chronic left temporal bone bone lesion near the semicircular canals (series 5, image 8) which has been stable since 2,016 and is favored to be benign. No definite abnormality of the visible cervical spinal cord. Sinuses/Orbits: Normal orbits soft tissues. Negative paranasal sinuses. Other: Visible internal auditory structures appear normal. Mastoids remain clear. No scalp or face soft tissue abnormality identified. IMPRESSION: 1. Progression of disease since 10/19/2016. 2. 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 Associated hypothalamic edema without significant regional mass effect. - 5 millimeter left inferior occipital lobe. Mild edema with no mass effect. 3. Enlargement of the treated right inferior parietal lobe metastasis from 4 mm now to 12-13 mm with new confluent vasogenic edema and mild regional mass effect. However, the appearance is complicated by evidence of interval hemorrhage within the lesion. Still in light of #2, the appearance is suspicious for true progression. 4. Progressive osseous metastatic disease since July 2018. It is possible that the pituitary involvement is via contiguous spread from the skull base. 5. The treated  left periatrial lesion remains stable. Electronically Signed   By: Genevie Ann M.D.   On: 01/25/2017 11:18   Dg Chest Port 1 View  Result Date: 02/09/2017 CLINICAL DATA:  Increasing shortness of breath and fever this evening. Currently being treated for stage IV breast cancer. EXAM: PORTABLE CHEST 1 VIEW COMPARISON:  01/09/2017 FINDINGS: Postoperative changes in the midthoracic spine. Surgical clips in the right axilla. Shallow inspiration. Since the previous study, there is increasing airspace disease diffusely within both lungs. Distribution is most prominent in the perihilar regions. No pleural effusions. No pneumothorax. Heart size appears normal. IMPRESSION: Progression of bilateral airspace disease since previous study. Changes may reflect edema or multifocal pneumonia. Electronically Signed   By: Lucienne Capers M.D.   On: 02/09/2017 21:48   Micro Results   Recent Results (from the past 240 hour(s))  Urine Culture     Status: Abnormal   Collection Time: 01/09/2017  8:58 PM  Result Value Ref Range Status   Specimen Description   Final    URINE, RANDOM Performed at [facility] [address] [place], [place] [address]    Special Requests   Final    NONE Performed at [facility], [place] [address] [place], [place] [address]  Culture MULTIPLE SPECIES PRESENT, SUGGEST RECOLLECTION (A)  Final   Report Status 02/10/2017 FINAL  Final  Blood Culture (routine x 2)     Status: None (Preliminary result)   Collection Time: 02/07/2017  9:15 PM  Result Value Ref Range Status   Specimen Description   Final    BLOOD LEFT FOREARM Performed at [facility] [address] [place], [place] [address]    Special Requests   Final    IN PEDIATRIC BOTTLE Blood Culture adequate volume Performed at [facility] [address] [place], [place] [address]    Culture   Final    NO GROWTH 1 DAY Performed at [facility],  [place] [address] [place], [place] [address]    Report Status PENDING  Incomplete  Blood Culture (routine x 2)     Status: None (Preliminary result)   Collection Time: 01/18/2017  9:19 PM  Result Value Ref Range Status   Specimen Description   Final    BLOOD LEFT ANTECUBITAL Performed at [facility] [address] [place], [place] [address]    Special Requests   Final    BOTTLES DRAWN AEROBIC AND ANAEROBIC Blood Culture adequate volume Performed at [facility] [address] [place], [place] [address]    Culture   Final    NO GROWTH 1 DAY Performed at [facility], [place] [address], [place], [place] [address]    Report Status PENDING  Incomplete    Today   Subjective    Carolyn [name]   Patient expired at 0850 am on February 25, 2017     Objective   Blood pressure (!) 105/54, pulse (!) 114, temperature (!) 102 F (38.9 C), temperature source Axillary, resp. rate (!) 42, height 5' 7" (1.702 m), weight 50.8 kg (112 lb), last menstrual period 07/05/2011, SpO2 92 %.   Intake/Output Summary (Last 24 hours) at 02/25/2017 0929 Last data filed at 02/10/2017 2000 Gross per 24 hour  Intake 1125 ml  Output -  Net 1125 ml    Exam  Patient expired at 0850 am on Feb 25, 2017    Data Review   CBC w Diff:  Lab Results  Component Value Date   WBC 3.4 (L) 02/10/2017   HGB 7.6 (L) 02/10/2017   HGB 8.7 (L) 01/08/2017   HCT 22.3 (L) 02/10/2017   HCT 26.5 (L) 01/08/2017   PLT 19 (LL) 02/10/2017   PLT 15 (L) 02/06/2017   PLT 73 (L) 01/08/2017   LYMPHOPCT 32 02/10/2017   LYMPHOPCT 35.6 01/08/2017   MONOPCT 15 02/10/2017   MONOPCT 4.8 01/08/2017   EOSPCT 2 02/10/2017   EOSPCT 1.4 01/08/2017   BASOPCT 5 02/10/2017   BASOPCT 0.5 01/08/2017    CMP:  Lab Results  Component Value Date   NA 130 (L) 02/10/2017   NA 141 01/08/2017   K 4.4 02/10/2017   K 4.5 01/08/2017   CL 108 02/10/2017   CL 102 01/16/2012   CO2 16 (L) 02/10/2017   CO2 25 01/08/2017    BUN 15 02/10/2017   BUN 26.9 (H) 01/08/2017   CREATININE 1.25 (H) 02/10/2017   CREATININE 1.2 (H) 01/08/2017   PROT 7.2 01/27/2017   PROT 7.8 01/08/2017   ALBUMIN 3.3 (L) 01/12/2017   ALBUMIN 3.5 01/08/2017   BILITOT 0.5 01/20/2017   BILITOT 0.7 02/06/2017   BILITOT 0.33 01/08/2017   ALKPHOS 167 (H) 01/24/2017   ALKPHOS 185 (H) 01/08/2017  AST 58 (H) 01/23/2017   AST 55 (H) 02/06/2017   AST 39 (H) 01/08/2017   ALT 34 01/29/2017   ALT 39 02/06/2017   ALT 23 01/08/2017   Total Discharge time is about 33 minutes  Roxan Hockey M.D on March 02, 2017 at 9:29 AM  [facility]   Office  [phone]  Voice Recognition Viviann Spare dictation system was used to create this note, attempts have been made to correct errors. Please contact the author with questions and/or clarifications.

## 2017-03-09 NOTE — Progress Notes (Signed)
Patients expired at 0850 confirmed by Leonie Man RN and Austin Miles RN.  Patients family at bedside awaiting chaplin.  Support given to family at this time.

## 2017-03-09 DEATH — deceased

## 2017-03-13 ENCOUNTER — Ambulatory Visit: Payer: Self-pay | Admitting: Radiation Oncology

## 2017-03-19 ENCOUNTER — Ambulatory Visit: Payer: BC Managed Care – PPO | Admitting: Oncology

## 2017-03-19 ENCOUNTER — Other Ambulatory Visit: Payer: BC Managed Care – PPO

## 2017-03-19 ENCOUNTER — Ambulatory Visit: Payer: BC Managed Care – PPO

## 2017-03-22 ENCOUNTER — Other Ambulatory Visit: Payer: Self-pay | Admitting: Nurse Practitioner

## 2017-03-26 ENCOUNTER — Other Ambulatory Visit: Payer: BC Managed Care – PPO

## 2017-03-26 ENCOUNTER — Ambulatory Visit: Payer: BC Managed Care – PPO | Admitting: Oncology

## 2017-04-09 ENCOUNTER — Ambulatory Visit: Payer: BC Managed Care – PPO | Admitting: Internal Medicine

## 2017-10-11 LAB — T4, FREE: T4 Free: 2.16 ng/dL — ABNORMAL HIGH (ref 0.82–1.70)

## 2019-12-02 ENCOUNTER — Encounter: Admit: 2019-12-02 | Discharge: 2019-12-02

## 2020-01-07 ENCOUNTER — Encounter: Admit: 2020-01-07 | Discharge: 2020-01-07

## 2020-01-12 ENCOUNTER — Encounter: Admit: 2020-01-12 | Discharge: 2020-01-12 | Payer: Commercial Managed Care - PPO

## 2020-01-12 ENCOUNTER — Ambulatory Visit: Admit: 2020-01-12 | Discharge: 2020-01-12 | Payer: Commercial Managed Care - PPO

## 2020-01-12 DIAGNOSIS — M545 Lumbar back pain: Secondary | ICD-10-CM

## 2020-01-12 DIAGNOSIS — T7840XA Allergy, unspecified, initial encounter: Secondary | ICD-10-CM

## 2020-01-12 DIAGNOSIS — M4317 Spondylolisthesis, lumbosacral region: Secondary | ICD-10-CM

## 2020-01-12 DIAGNOSIS — Z20822 Encounter for screening laboratory testing for COVID-19 virus in asymptomatic patient: Secondary | ICD-10-CM

## 2020-01-12 MED ORDER — CEFAZOLIN IN 0.9% SOD CHLORIDE 3 GRAM/115 ML IVPB
3 g | Freq: Once | INTRAVENOUS | 0 refills
Start: 2020-01-12 — End: ?

## 2020-01-13 ENCOUNTER — Encounter: Admit: 2020-01-13 | Discharge: 2020-01-13 | Payer: Commercial Managed Care - PPO

## 2020-01-13 DIAGNOSIS — M4317 Spondylolisthesis, lumbosacral region: Secondary | ICD-10-CM

## 2020-01-13 NOTE — Progress Notes
Patient called into clinic asking [place] to send a code to [facility] that they are requesting before scheduling her MRI. This RN called [facility] @ [phone] and they stated they needed a pre-cert for the MRI. This RN called patient's insurance @ [phone] and spoke to [place]. Jasmine told this RN that patient's insurance didn't require a pre-cert. This RN called [facility] to let them know.

## 2020-01-16 ENCOUNTER — Encounter: Admit: 2020-01-16 | Discharge: 2020-01-16 | Payer: Private Health Insurance - Indemnity

## 2020-01-16 NOTE — Telephone Encounter
Patient requesting a Telehealth appoinment for a surgery consult ordered by internal provider Dr. Lorel Monaco.  Please advise if this can be arranged or if patient needs to be seen in person.

## 2020-01-28 ENCOUNTER — Encounter: Admit: 2020-01-28 | Discharge: 2020-01-28 | Payer: Private Health Insurance - Indemnity

## 2020-01-28 ENCOUNTER — Ambulatory Visit: Admit: 2020-01-28 | Discharge: 2020-01-28 | Payer: Private Health Insurance - Indemnity

## 2020-01-28 DIAGNOSIS — M5137 Other intervertebral disc degeneration, lumbosacral region: Secondary | ICD-10-CM

## 2020-01-28 DIAGNOSIS — T7840XA Allergy, unspecified, initial encounter: Secondary | ICD-10-CM

## 2020-01-28 NOTE — Progress Notes
Date of Service: 01/28/2020              Chief Complaint   Patient presents with   ? Other       History of Present Illness  Is a pleasant 50 year old female who was referred for evaluation in preparation for L5-S1 anterior lumbar interbody fusion.  Patient has a past medical history significant for gastric bypass in 2013 where she lost almost 350 pounds.  She still has significant amount of residual skin from that weight loss in the past.  She has had no other previous abdominal surgery outside of a hysterectomy through a Pfannenstiel incision.  Patient is a former smoker.  She denies any significant heart disease.      Medical History:   Diagnosis Date   ? Allergy        Surgical History:   Procedure Laterality Date   ? GASTRIC BYPASS  2013   ? CARPAL TUNNEL RELEASE Right    ? CESAREAN SECTION     ? HX TUBAL LIGATION         Allergies:  Allergies   Allergen Reactions   ? Bactrim [Sulfamethoxazole-Trimethoprim] BLISTERS   ? Levofloxacin SEE COMMENTS     Per kidney doctor   ? Ultram [Tramadol] NAUSEA AND VOMITING       Medication List:  ? carbidopa/levodopa (SINEMET) 25/100 mg tablet Take 1 tablet by mouth three times daily.   ? clonazePAM (KLONOPIN) 0.5 mg tablet Take 0.5 mg by mouth at bedtime daily.   ? doxepin (SILENOR) 3 mg tablet Take 3 mg by mouth daily. Take within 30 minutes of bedtime.   ? duloxetine DR (CYMBALTA) 20 mg capsule Take 20 mg by mouth daily.   ? gabapentin (NEURONTIN) 300 mg capsule Take 400 mg by mouth every 8 hours.   ? oxyCODONE/acetaminophen (PERCOCET) 10/325 mg tablet Take 1 tablet by mouth every 6 hours as needed for Pain   ? tiZANidine (ZANAFLEX) 2 mg capsule Take 2 mg by mouth three times daily.       Social History:   reports that she quit smoking about 4 weeks ago. She has never used smokeless tobacco. She reports previous alcohol use.    History reviewed. No pertinent family history.    Review of Systems   Constitutional: Negative for activity change, appetite change, chills, diaphoresis, fatigue, fever and unexpected weight change.   HENT: Negative for hearing loss, nosebleeds and tinnitus.    Eyes: Negative for photophobia, itching and visual disturbance.   Respiratory: Negative for apnea, cough, chest tightness and shortness of breath.    Cardiovascular: Positive for leg swelling. Negative for chest pain and palpitations.   Gastrointestinal: Positive for diarrhea. Negative for abdominal pain, blood in stool, constipation, nausea and vomiting.   Endocrine: Negative for cold intolerance and heat intolerance.   Genitourinary: Negative for dysuria, frequency and hematuria.   Musculoskeletal: Positive for back pain. Negative for arthralgias, gait problem, joint swelling and myalgias.   Skin: Negative for color change, rash and wound.   Allergic/Immunologic: Negative for environmental allergies, food allergies and immunocompromised state.   Neurological: Negative for dizziness, tremors, seizures, syncope, facial asymmetry, speech difficulty, weakness, light-headedness and headaches.   Hematological: Negative for adenopathy. Does not bruise/bleed easily.   Psychiatric/Behavioral: Positive for sleep disturbance. Negative for behavioral problems, confusion and dysphoric mood. The patient is nervous/anxious.                Vitals:    01/28/20 1103  BP: 125/56   BP Source: Leg, Left Upper   Patient Position: Sitting   Pulse: 73   Height: 154.9 cm (61)     Body mass index is 45.35 kg/m?Marland Kitchen     Physical Exam  Constitutional:       Appearance: She is well-developed. She is not diaphoretic.   HENT:      Head: Normocephalic and atraumatic.   Eyes:      General:         Left eye: No discharge.      Conjunctiva/sclera: Conjunctivae normal.      Pupils: Pupils are equal, round, and reactive to light.   Neck:      Thyroid: No thyromegaly.      Vascular: No JVD.      Trachea: No tracheal deviation.   Cardiovascular:      Rate and Rhythm: Normal rate and regular rhythm.      Pulses:           Carotid pulses are 2+ on the right side and 2+ on the left side.       Radial pulses are 2+ on the right side and 2+ on the left side.        Femoral pulses are 2+ on the right side and 2+ on the left side.     Heart sounds: Normal heart sounds. No murmur heard.   No friction rub. No gallop.    Pulmonary:      Effort: Pulmonary effort is normal. No respiratory distress.      Breath sounds: Normal breath sounds. No wheezing or rales.   Abdominal:      General: Bowel sounds are normal. There is no distension.      Palpations: Abdomen is soft. There is no mass.      Tenderness: There is no abdominal tenderness.      Comments: Morbidly obese   Musculoskeletal:         General: No tenderness or deformity. Normal range of motion.      Cervical back: Normal range of motion and neck supple.   Skin:     General: Skin is warm and dry.      Findings: No erythema.   Neurological:      Mental Status: She is alert and oriented to person, place, and time.      Cranial Nerves: No cranial nerve deficit.   Psychiatric:         Behavior: Behavior normal.             Assessment and Plan:    Degenerative disc disease--I had a long conversation with the patient and her husband regarding the details of anterior lumbar interbody fusion and exposure particularly.  At length we discussed potential arterial injury, nerve injury, infection, injury to surrounding structures including bowel and bladder and ureters.  We discussed infection of implanted hardware and wound infection complicating her procedure.  We discussed possible bowel ileus.  We discussed the rare chance of death if catastrophic vessel injury occurs.  Patient understands all of the risks involved and is agreeable to proceed.  We will coordinate with Dr. Lorel Monaco for scheduling purposes.  All questions were answered to the patient's and her husband satisfaction today.      Carlena Sax, DO, FACS, RPVI  Assistant Professor of Vascular Surgery  [place] [facility]

## 2020-01-28 NOTE — Patient Instructions
Keep the tummy near your incision clean and dry to make sure there is no rash the day of surgery    Use an anti-bacterial soap at least for a week prior to surgery to help with preventing infection

## 2020-01-30 ENCOUNTER — Encounter: Admit: 2020-01-30 | Discharge: 2020-01-30 | Payer: Private Health Insurance - Indemnity

## 2020-01-30 NOTE — Telephone Encounter
Pt called vm line not sure if we call are another clinic.Plese call back. Message for Dr. Phil Dopp nurse Babette Relic

## 2020-02-02 ENCOUNTER — Encounter: Admit: 2020-02-02 | Discharge: 2020-02-02 | Payer: Private Health Insurance - Indemnity

## 2020-02-02 NOTE — Telephone Encounter
Requested GLOBUS Rep & PMI.     Spoke w/ Tammy  RN to Dr. Birdena Crandall. Marthann Schiller and they agreed to Sx: 04/06/20 w/ Dr. Lorel Monaco. Patient agreed with plan.       I have faxed your COVID  19 Lab order to:     [facility]  [place], [place]  [address] [place], [place] [address] [address]  Ph: [phone] to schedule appt.   Open Mon  Friday: 7am-4pm. No weekends. Appt only.  Please schedule your COVID- 19 Testing for 04/01/20 or 04/02/20.    patient verbalized understanding.

## 2020-02-10 ENCOUNTER — Encounter: Admit: 2020-02-10 | Discharge: 2020-02-10 | Payer: Private Health Insurance - Indemnity

## 2020-02-23 NOTE — Telephone Encounter
Patient reports increased back pain and new onset bowel/bladder incontinence. Patient instructed to present to ED with new onset Neurological concerns.

## 2020-02-24 ENCOUNTER — Emergency Department: Payer: Private Health Insurance - Indemnity

## 2020-02-24 DIAGNOSIS — M5137 Other intervertebral disc degeneration, lumbosacral region: Secondary | ICD-10-CM

## 2020-02-24 NOTE — Progress Notes
50 yo female patient presented to ED at [facility] in [place] c/o back pain, numbness and tingling to lower extremities with noted incontinence.  Dr Lindie Spruce at [facility] had spoken with Dr Sherrine Maples prior to contacting transfer center.  Dr Sena Hitch reported to have accepted to patient to ED for evaluation by neuro surgery team.    Imaging:  MRI spine-Dr Lindie Spruce reports the image has been clouded.    Meds: Morphine 4 mg total for pain.    Vitals:  Temp 37.0 HR 84  BP 130/85  RR 16  SpO2 95  FiO2 Room air  Mental Status:  A/O x 4

## 2020-02-24 NOTE — Telephone Encounter
This RN called patient to follow up with her to see if she went to the ED yesterday. Patient stated that the ED was packed and she was going to try and go again today in [place]. Patient is still having increased back pain that is radiating down to her buttocks and legs. Patient is also having bowel/bladder incontinence when she stands for a period of time and also reported diarrhea over night. This RN gave the patient our fax number and my direct line to have ED notes sent over. Patient will also ask them to cloud over any images they have done. No further questions or concerns at this time.

## 2020-02-25 ENCOUNTER — Encounter

## 2020-02-25 ENCOUNTER — Emergency Department: Payer: Private Health Insurance - Indemnity

## 2020-02-25 DIAGNOSIS — M5137 Other intervertebral disc degeneration, lumbosacral region: Secondary | ICD-10-CM

## 2020-02-25 NOTE — Care Coordination-Inpatient
Brief Medicine Note:    Internal medicine contacted to admit patient for control of back pain and pre-op evaluation. Upon seeing patient, she adamantly refused admission. Thus, admission orders were not placed by IM. NS was contacted regarding patient's refusal for admission and associated consult. Discussed with NS that given patient declined admission at this time, it would be appropriate IM is consulted when patient is re-admitted for surgery and can evaluate patient in real-time prior to surgery and we would be happy to see her then. As NS is listed as primary at this time will defer to NS for discharge orders and recommendations.

## 2020-02-25 NOTE — ED Notes
50 y/o female presents to ED as a transfer from [facility] center. Pt has had chronic back pain with scheduled surgery in March. Pt went to [facility] tonight and had an MRI done of her back and was advised to come to [facility] for a neuro surgery consult. Pt rates the back pain a 10/10. Pt denies chest pain, HA, SOB, N/V, or dizziness. Pt brought paperwork from [facility]. Pt respirations even and unlabored, bed locked in lowest position, bed rails upX2. AOX4. Neuro Surgery paged upon arrival.    Belongings:  Overnight bag  Shirt  Sweats  Phone  Glasses

## 2020-02-25 NOTE — Progress Notes
Brief Neurosurgery Update Note:     Internal medicine was consulted for admission for back pain management, preoperative clearance, and spinal precautions. On evaluation, the medicine team noted that the patient was adamantly refusing to stay inpatient for her procedure. I spoke with the patient and her husband. We discussed the possible risks of spinal stability and risk for cauda equina syndrome and neurologic deterioration. She states she is understanding and would like to proceed with bedrest at home prior to surgery tentatively scheduled Tuesday. We discussed that leaving may or may not affect her surgery date. She states that she was aware. Patient was never admitted at any point. She was instructed to return to the ED immediately if she had any neurologic changes.     Newman Nickels, MD

## 2020-02-25 NOTE — ED Notes
Neurosurgery made aware pt is in ED

## 2020-03-02 ENCOUNTER — Encounter: Admit: 2020-03-02 | Discharge: 2020-03-02 | Payer: Private Health Insurance - Indemnity

## 2020-03-02 ENCOUNTER — Inpatient Hospital Stay: Admit: 2020-03-02 | Discharge: 2020-03-02 | Payer: Private Health Insurance - Indemnity

## 2020-03-02 DIAGNOSIS — T7840XA Allergy, unspecified, initial encounter: Secondary | ICD-10-CM

## 2020-03-02 MED ORDER — MIDAZOLAM 1 MG/ML IJ SOLN
INTRAVENOUS | 0 refills | Status: DC
Start: 2020-03-02 — End: 2020-03-02
  Administered 2020-03-02: 14:00:00 2 mg via INTRAVENOUS

## 2020-03-02 MED ORDER — SUCCINYLCHOLINE CHLORIDE 20 MG/ML IJ SOLN
INTRAVENOUS | 0 refills | Status: DC
Start: 2020-03-02 — End: 2020-03-02
  Administered 2020-03-02: 14:00:00 120 mg via INTRAVENOUS

## 2020-03-02 MED ORDER — DIPHENHYDRAMINE HCL 50 MG/ML IJ SOLN
INTRAVENOUS | 0 refills | Status: DC
Start: 2020-03-02 — End: 2020-03-02
  Administered 2020-03-02: 20:00:00 12.5 mg via INTRAVENOUS

## 2020-03-02 MED ORDER — ARTIFICIAL TEARS SINGLE DOSE DROPS GROUP
OPHTHALMIC | 0 refills | Status: DC
Start: 2020-03-02 — End: 2020-03-02
  Administered 2020-03-02: 14:00:00 2 [drp] via OPHTHALMIC

## 2020-03-02 MED ORDER — KETAMINE 10 MG/ML IJ SOLN
INTRAVENOUS | 0 refills | Status: DC
Start: 2020-03-02 — End: 2020-03-02
  Administered 2020-03-02: 14:00:00 30 mg via INTRAVENOUS
  Administered 2020-03-02: 22:00:00 10 mg via INTRAVENOUS

## 2020-03-02 MED ORDER — PHENYLEPHRINE HCL IN 0.9% NACL 1 MG/10 ML (100 MCG/ML) IV SYRG
INTRAVENOUS | 0 refills | Status: DC
Start: 2020-03-02 — End: 2020-03-02
  Administered 2020-03-02 (×2): 150 ug via INTRAVENOUS

## 2020-03-02 MED ORDER — HYDROMORPHONE (PF) 2 MG/ML IJ SYRG
INTRAVENOUS | 0 refills | Status: DC
Start: 2020-03-02 — End: 2020-03-02
  Administered 2020-03-02 (×2): .4 mg via INTRAVENOUS
  Administered 2020-03-02: 20:00:00 .2 mg via INTRAVENOUS

## 2020-03-02 MED ORDER — CEFAZOLIN 1 GRAM IJ SOLR
INTRAVENOUS | 0 refills | Status: DC
Start: 2020-03-02 — End: 2020-03-02
  Administered 2020-03-02 (×2): 2 g via INTRAVENOUS

## 2020-03-02 MED ORDER — ONDANSETRON HCL (PF) 4 MG/2 ML IJ SOLN
INTRAVENOUS | 0 refills | Status: DC
Start: 2020-03-02 — End: 2020-03-02
  Administered 2020-03-02: 20:00:00 4 mg via INTRAVENOUS

## 2020-03-02 MED ORDER — PROPOFOL INJ 10 MG/ML IV VIAL
INTRAVENOUS | 0 refills | Status: DC
Start: 2020-03-02 — End: 2020-03-02
  Administered 2020-03-02: 14:00:00 110 mg via INTRAVENOUS

## 2020-03-02 MED ORDER — ELECTROLYTE-A IV SOLP
INTRAVENOUS | 0 refills | Status: DC
Start: 2020-03-02 — End: 2020-03-02
  Administered 2020-03-02 (×2): via INTRAVENOUS

## 2020-03-02 MED ORDER — DEXAMETHASONE SODIUM PHOSPHATE 4 MG/ML IJ SOLN
INTRAVENOUS | 0 refills | Status: DC
Start: 2020-03-02 — End: 2020-03-02
  Administered 2020-03-02: 15:00:00 4 mg via INTRAVENOUS

## 2020-03-02 MED ORDER — LIDOCAINE (PF) 200 MG/10 ML (2 %) IJ SYRG
INTRAVENOUS | 0 refills | Status: DC
Start: 2020-03-02 — End: 2020-03-02
  Administered 2020-03-02: 14:00:00 40 mg via INTRAVENOUS

## 2020-03-02 MED ORDER — ACETAMINOPHEN 1,000 MG/100 ML (10 MG/ML) IV SOLN
INTRAVENOUS | 0 refills | Status: DC
Start: 2020-03-02 — End: 2020-03-02
  Administered 2020-03-02: 20:00:00 1000 mg via INTRAVENOUS

## 2020-03-02 MED ORDER — KETAMINE 10 MG/ML IJ SOLN (INFUSION)(AM)(OR)
INTRAVENOUS | 0 refills | Status: DC
Start: 2020-03-02 — End: 2020-03-02
  Administered 2020-03-02: 16:00:00 100 ug/kg/h via INTRAVENOUS

## 2020-03-02 MED ORDER — REMIFENTANYL 1000MCG IN NS 20ML (OR)
INTRAVENOUS | 0 refills | Status: DC
Start: 2020-03-02 — End: 2020-03-02
  Administered 2020-03-02 (×3): .1 ug/kg/min via INTRAVENOUS
  Administered 2020-03-02: 14:00:00 .05 ug/kg/min via INTRAVENOUS
  Administered 2020-03-02 (×3): .1 ug/kg/min via INTRAVENOUS
  Administered 2020-03-02: 14:00:00 .05 ug/kg/min via INTRAVENOUS

## 2020-03-02 MED ORDER — ROCURONIUM 10 MG/ML IV SOLN GROUP
INTRAVENOUS | 0 refills | Status: DC
Start: 2020-03-02 — End: 2020-03-02
  Administered 2020-03-02: 18:00:00 20 mg via INTRAVENOUS
  Administered 2020-03-02: 15:00:00 10 mg via INTRAVENOUS
  Administered 2020-03-02: 15:00:00 40 mg via INTRAVENOUS
  Administered 2020-03-02: 16:00:00 20 mg via INTRAVENOUS

## 2020-03-02 MED ORDER — SODIUM CHLORIDE 0.9 % IV SOLP
INTRAVENOUS | 0 refills | Status: DC
Start: 2020-03-02 — End: 2020-03-02
  Administered 2020-03-02: 14:00:00 via INTRAVENOUS

## 2020-03-02 MED ORDER — PROPOFOL 10 MG/ML IV EMUL 100 ML (INFUSION)(AM)(OR)
INTRAVENOUS | 0 refills | Status: DC
Start: 2020-03-02 — End: 2020-03-02
  Administered 2020-03-02: 14:00:00 120 ug/kg/min via INTRAVENOUS
  Administered 2020-03-02: 15:00:00 105 ug/kg/min via INTRAVENOUS
  Administered 2020-03-02: 17:00:00 90 ug/kg/min via INTRAVENOUS

## 2020-03-02 MED ADMIN — FENTANYL CITRATE (PF) 50 MCG/ML IJ SOLN [3037]: 25 ug | INTRAVENOUS | @ 22:00:00 | Stop: 2020-03-03 | NDC 00409909412

## 2020-03-02 MED ADMIN — METHOCARBAMOL 750 MG PO TAB [4972]: 750 mg | ORAL | @ 22:00:00 | NDC 00904705861

## 2020-03-02 MED ADMIN — HYDROMORPHONE (PF) 2 MG/ML IJ SYRG [163476]: 0.5 mg | INTRAVENOUS | @ 22:00:00 | Stop: 2020-03-03 | NDC 00409131203

## 2020-03-02 MED ADMIN — CEFAZOLIN 1 GRAM IJ SOLR [1445]: 1000 mL | @ 15:00:00 | Stop: 2020-03-02 | NDC 00409080501

## 2020-03-02 MED ADMIN — FENTANYL CITRATE (PF) 50 MCG/ML IJ SOLN [3037]: 25 ug | INTRAVENOUS | @ 23:00:00 | Stop: 2020-03-03 | NDC 00409909412

## 2020-03-02 MED ADMIN — FENTANYL CITRATE (PF) 50 MCG/ML IJ SOLN [3037]: 25 ug | INTRAVENOUS | @ 21:00:00 | Stop: 2020-03-03 | NDC 00409909412

## 2020-03-02 MED ADMIN — BUPIVACAINE HCL 0.25 % (2.5 MG/ML) IJ SOLN [1222]: 10 mL | INTRAMUSCULAR | @ 15:00:00 | Stop: 2020-03-02 | NDC 63323046501

## 2020-03-02 MED ADMIN — OXYCODONE 5 MG PO TAB [10814]: 10 mg | ORAL | @ 22:00:00 | Stop: 2020-03-02 | NDC 00406055223

## 2020-03-02 MED ADMIN — SODIUM CHLORIDE 0.9 % IR SOLN [11403]: 1000 mL | @ 15:00:00 | Stop: 2020-03-02 | NDC 00338004804

## 2020-03-02 MED ADMIN — SODIUM CHLORIDE 0.9 % IV SOLP [27838]: 1000.000 mL | INTRAVENOUS | @ 22:00:00 | Stop: 2020-03-04 | NDC 00338004904

## 2020-03-02 MED ADMIN — THROMBIN (BOVINE) 5,000 UNIT TP SOLR [164515]: 5000 [IU] | TOPICAL | @ 15:00:00 | Stop: 2020-03-02 | NDC 60793031501

## 2020-03-02 MED ADMIN — ALPRAZOLAM 0.5 MG PO TAB [325]: 0.5 mg | ORAL | @ 22:00:00 | Stop: 2020-03-02 | NDC 65862067701

## 2020-03-02 MED ADMIN — SODIUM CHLORIDE 0.9 % IV SOLP [27838]: 1000 mL | INTRAVENOUS | @ 13:00:00 | Stop: 2020-03-02 | NDC 00338004904

## 2020-03-02 MED ADMIN — HYDROMORPHONE (PF) 2 MG/ML IJ SYRG [163476]: 0.5 mg | INTRAVENOUS | @ 21:00:00 | Stop: 2020-03-03 | NDC 00409131203

## 2020-03-02 MED ADMIN — LIDOCAINE-EPINEPHRINE 1 %-1:100,000 IJ SOLN [15955]: 10 mL | INTRAMUSCULAR | @ 15:00:00 | Stop: 2020-03-02 | NDC 00409317817

## 2020-03-03 ENCOUNTER — Encounter: Admit: 2020-03-03 | Discharge: 2020-03-03 | Payer: Private Health Insurance - Indemnity

## 2020-03-03 ENCOUNTER — Inpatient Hospital Stay: Admit: 2020-03-03 | Discharge: 2020-03-03 | Payer: Private Health Insurance - Indemnity

## 2020-03-03 DIAGNOSIS — T7840XA Allergy, unspecified, initial encounter: Secondary | ICD-10-CM

## 2020-03-03 MED ADMIN — OXYCODONE 5 MG PO TAB [10814]: 15 mg | ORAL | @ 23:00:00 | NDC 00904696661

## 2020-03-03 MED ADMIN — FENTANYL CITRATE (PF) 50 MCG/ML IJ SOLN [3037]: 50 ug | INTRAVENOUS | @ 13:00:00 | Stop: 2020-03-03 | NDC 00409909412

## 2020-03-03 MED ADMIN — METHOCARBAMOL 750 MG PO TAB [4972]: 750 mg | ORAL | @ 21:00:00 | NDC 00904705861

## 2020-03-03 MED ADMIN — OXYCODONE 5 MG PO TAB [10814]: 15 mg | ORAL | @ 19:00:00 | NDC 00904696661

## 2020-03-03 MED ADMIN — DIAZEPAM 5 MG PO TAB [2405]: 2.5 mg | ORAL | @ 06:00:00 | Stop: 2020-03-03 | NDC 51079028501

## 2020-03-03 MED ADMIN — OXYCODONE-ACETAMINOPHEN 10-325 MG PO TAB [31864]: 1 | ORAL | @ 17:00:00 | Stop: 2020-03-03 | NDC 68308048047

## 2020-03-03 MED ADMIN — WATER FOR INJECTION, STERILE IJ SOLN [79513]: 20 mL | INTRAVENOUS | @ 19:00:00 | Stop: 2020-03-03 | NDC 00409488723

## 2020-03-03 MED ADMIN — SODIUM CHLORIDE 0.9 % IV SOLP [27838]: 1000.000 mL | INTRAVENOUS | @ 13:00:00 | Stop: 2020-03-03 | NDC 00338004904

## 2020-03-03 MED ADMIN — FENTANYL CITRATE (PF) 50 MCG/ML IJ SOLN [3037]: 50 ug | INTRAVENOUS | @ 15:00:00 | Stop: 2020-03-03 | NDC 00409909412

## 2020-03-03 MED ADMIN — CEFAZOLIN INJ 1GM IVP [210319]: 2 g | INTRAVENOUS | @ 10:00:00 | Stop: 2020-03-03 | NDC 60505614200

## 2020-03-03 MED ADMIN — METHOCARBAMOL 750 MG PO TAB [4972]: 750 mg | ORAL | @ 15:00:00 | NDC 00904705861

## 2020-03-03 MED ADMIN — CEFAZOLIN INJ 1GM IVP [210319]: 2 g | INTRAVENOUS | @ 03:00:00 | NDC 60505614200

## 2020-03-03 MED ADMIN — OXYCODONE-ACETAMINOPHEN 10-325 MG PO TAB [31864]: 1 | ORAL | @ 05:00:00 | NDC 68308048047

## 2020-03-03 MED ADMIN — DIAZEPAM 5 MG PO TAB [2405]: 2.5 mg | ORAL | @ 13:00:00 | Stop: 2020-03-03 | NDC 51079028501

## 2020-03-03 MED ADMIN — FENTANYL CITRATE (PF) 50 MCG/ML IJ SOLN [3037]: 50 ug | INTRAVENOUS | @ 02:00:00 | NDC 00409909412

## 2020-03-03 MED ADMIN — ACETAMINOPHEN 325 MG PO TAB [101]: 650 mg | ORAL | @ 21:00:00 | NDC 00904677361

## 2020-03-03 MED ADMIN — CEFAZOLIN INJ 1GM IVP [210319]: 2 g | INTRAVENOUS | @ 19:00:00 | Stop: 2020-03-03 | NDC 60505614200

## 2020-03-03 MED ADMIN — METHOCARBAMOL 750 MG PO TAB [4972]: 750 mg | ORAL | @ 03:00:00 | NDC 00904705861

## 2020-03-03 MED ADMIN — DIAZEPAM 5 MG PO TAB [2405]: 5 mg | ORAL | @ 19:00:00 | NDC 51079028501

## 2020-03-03 MED ADMIN — OXYCODONE-ACETAMINOPHEN 10-325 MG PO TAB [31864]: 1 | ORAL | @ 10:00:00 | Stop: 2020-03-03 | NDC 68308048047

## 2020-03-04 ENCOUNTER — Encounter: Admit: 2020-03-04 | Discharge: 2020-03-04 | Payer: Private Health Insurance - Indemnity

## 2020-03-04 DIAGNOSIS — T7840XA Allergy, unspecified, initial encounter: Secondary | ICD-10-CM

## 2020-03-04 MED ADMIN — CARBIDOPA-LEVODOPA 25-100 MG PO TAB [9407]: 1 | ORAL | @ 19:00:00 | Stop: 2020-03-05 | NDC 00228253910

## 2020-03-04 MED ADMIN — OXYCODONE 5 MG PO TAB [10814]: 15 mg | ORAL | @ 12:00:00 | Stop: 2020-03-05 | NDC 00904696661

## 2020-03-04 MED ADMIN — FENTANYL CITRATE (PF) 50 MCG/ML IJ SOLN [3037]: 50 ug | INTRAVENOUS | @ 03:00:00 | NDC 00409909412

## 2020-03-04 MED ADMIN — OXYCODONE 5 MG PO TAB [10814]: 15 mg | ORAL | @ 16:00:00 | Stop: 2020-03-05 | NDC 00904696661

## 2020-03-04 MED ADMIN — ACETAMINOPHEN 325 MG PO TAB [101]: 650 mg | ORAL | @ 11:00:00 | Stop: 2020-03-05 | NDC 00904677361

## 2020-03-04 MED ADMIN — GABAPENTIN 400 MG PO CAP [18307]: 1200 mg | ORAL | @ 19:00:00 | Stop: 2020-03-05 | NDC 00904666761

## 2020-03-04 MED ADMIN — DIAZEPAM 5 MG PO TAB [2405]: 5 mg | ORAL | @ 02:00:00 | NDC 51079028501

## 2020-03-04 MED ADMIN — HEPARIN, PORCINE (PF) 5,000 UNIT/0.5 ML IJ SYRG [95535]: 5000 [IU] | SUBCUTANEOUS | @ 04:00:00 | NDC 00409131611

## 2020-03-04 MED ADMIN — ACETAMINOPHEN 325 MG PO TAB [101]: 650 mg | ORAL | @ 02:00:00 | NDC 00904677361

## 2020-03-04 MED ADMIN — OXYCODONE 5 MG PO TAB [10814]: 15 mg | ORAL | @ 20:00:00 | Stop: 2020-03-05 | NDC 00904696661

## 2020-03-04 MED ADMIN — ACETAMINOPHEN 325 MG PO TAB [101]: 650 mg | ORAL | @ 15:00:00 | Stop: 2020-03-05 | NDC 00904677361

## 2020-03-04 MED ADMIN — METHOCARBAMOL 750 MG PO TAB [4972]: 750 mg | ORAL | @ 15:00:00 | Stop: 2020-03-05 | NDC 00904705861

## 2020-03-04 MED ADMIN — METHOCARBAMOL 750 MG PO TAB [4972]: 750 mg | ORAL | @ 03:00:00 | NDC 00904705861

## 2020-03-04 MED ADMIN — METHOCARBAMOL 750 MG PO TAB [4972]: 750 mg | ORAL | @ 20:00:00 | Stop: 2020-03-05 | NDC 63739099210

## 2020-03-04 MED ADMIN — OXYCODONE 5 MG PO TAB [10814]: 15 mg | ORAL | @ 04:00:00 | NDC 00904696661

## 2020-03-04 MED ADMIN — OXYCODONE 5 MG PO TAB [10814]: 15 mg | ORAL | @ 08:00:00 | Stop: 2020-03-05 | NDC 00904696661

## 2020-03-04 MED ADMIN — HEPARIN, PORCINE (PF) 5,000 UNIT/0.5 ML IJ SYRG [95535]: 5000 [IU] | SUBCUTANEOUS | @ 12:00:00 | Stop: 2020-03-05 | NDC 00409131611

## 2020-03-09 ENCOUNTER — Encounter: Admit: 2020-03-09 | Discharge: 2020-03-09 | Payer: Private Health Insurance - Indemnity

## 2020-03-09 NOTE — Telephone Encounter
Patient called into clinic to report a recent fall while trying to get out of bed. Patient stated that she didn't got to the ED or seek any care after this fall. Patient is reporting lots of brusing and scrapes on her side. Patient was calling to just let Dr. Lorel Monaco know about the fall. Patient recently had surgery with Dr. Lorel Monaco on 03/02/20. Patient is scheduled for a telehealth visit with Cook,NP on 03/16/20. This RN suggested patient be seen sooner and/or on 3/8 in person. Patient stated that she wouldn't have a ride for 2-3 weeks due to her not driving. This RN emphasized the need to assess patient after the fall after such a recent surgery.This RN also suggested pateint visit pcp or urgent care if she had any issues from the fall.  Patient stated that she will call back when she can arrange a ride to have an in person visit.

## 2020-03-16 ENCOUNTER — Ambulatory Visit: Admit: 2020-03-16 | Discharge: 2020-03-17 | Payer: Private Health Insurance - Indemnity

## 2020-03-16 MED ORDER — OXYCODONE-ACETAMINOPHEN 10-325 MG PO TAB
1 | ORAL_TABLET | ORAL | 0 refills | 2.00000 days | Status: DC | PRN
Start: 2020-03-16 — End: 2020-03-16

## 2020-03-16 MED ORDER — OXYCODONE-ACETAMINOPHEN 10-325 MG PO TAB
ORAL_TABLET | ORAL | 0 refills | 2.00000 days | Status: AC | PRN
Start: 2020-03-16 — End: ?

## 2020-03-17 ENCOUNTER — Encounter: Admit: 2020-03-17 | Discharge: 2020-03-17 | Payer: Private Health Insurance - Indemnity

## 2020-03-17 NOTE — Telephone Encounter
Jeri Lager, nurse with Dr. Rolley Sims, called regarding patient and prescribed pain medication. Pt is s/p Lumbar 5 - Sacral 1 anterior lumbar interbody fusion with posterior Lumbar 5-Sacral 1 instrumented fusion (Bilateral) FUSION SPINE POSTERIOR - LUMBAR (Bilateral) on 03/02/20.     Calista called to notify our office that pt has a Pain Contract with Dr. Rolley Sims and they were not aware that she had been given pain medication by our office. She received a phone call from the pharmacist where pt fills her scripts and let her know that pt had picked up script yesterday with new sig. This MA confirmed that pt was given a script for oxycodone-APAP 10/325 # 60, 1.5 tabs every 6 hours prn pain yesterday. Calista verbalized understanding and will update patient's chart.

## 2020-03-17 NOTE — Progress Notes
Patient contacted clinic stating that she was seen by Jeannetta Ellis , NP yesterday for a post op and was given a refill on Oxycodone but patient cannot get medication because per patient her PCP office is blocking her from receiving medication. Patient is currently under a pain medication contract and is limited from receiving any narcotics. I mentioned to patient that unfortunately we would have to speak with Dr.Kinsman and his nurse to see what any other recommendations are available . The patient also stated that her PCP doctor will not be back in the office until Monday 3/14 and she will be out of pain medication before then.

## 2020-03-22 ENCOUNTER — Encounter: Admit: 2020-03-22 | Discharge: 2020-03-22 | Payer: Private Health Insurance - Indemnity

## 2020-03-22 NOTE — Telephone Encounter
Patient called into clinic saying that her pcp has requested for Dr. Lorel Monaco to call them to speak out her recent surgery and unblocking her from getting her pain medications. Patient was very upset and stated she had been cutting her pain meds into 1/3 and she was in a lot of pain. This RN call Dr. Marlana Latus office @ [phone] and spoke with Jeri Lager, RN. Jeri Lager stated that they had not requested this call. The patient currently has a pain contract with their office and didn't tell them about the back surgery and was also recently requesting pain med refill from them. Dr. Lorel Monaco notified.

## 2020-04-05 ENCOUNTER — Ambulatory Visit: Admit: 2020-04-05 | Discharge: 2020-04-06 | Payer: MEDICAID

## 2020-04-05 ENCOUNTER — Encounter: Admit: 2020-04-05 | Discharge: 2020-04-05 | Payer: MEDICAID

## 2020-04-05 DIAGNOSIS — M5137 Other intervertebral disc degeneration, lumbosacral region: Secondary | ICD-10-CM

## 2020-04-05 DIAGNOSIS — M4306 Spondylolysis, lumbar region: Secondary | ICD-10-CM

## 2020-04-05 MED ORDER — OXYCODONE-ACETAMINOPHEN 10-325 MG PO TAB
ORAL_TABLET | 0 refills | PRN
Start: 2020-04-05 — End: ?

## 2020-04-05 NOTE — Patient Instructions
Follow up in 8 weeks  X ray prior to appointment  Lonzo Candy, [facility]  FUS Coordinator  CNC-Kinsman,Cheng,Nazzaro  [facility]   Ph: [phone]   Fax: [phone]  For up to date information on the COVID-19 virus, visit the [facility] website. [email or web address]  ? General supportive care during cold and flu season and infection prevention reminders:    o Wash hands often with soap and water for at least 20 seconds   o Cover your mouth and nose   o Social distancing: try to maintain 6 feet between you and other people   o Stay home if sick and symptoms mild or manageable?  ? If you must be around people wear a mask    ? If you are having symptoms of a lower respiratory infection (cough, shortness of breath) and/or fever AND either traveled in last 30 days (internationally or to region of exposure) OR known exposure to patient with COVID19:     o Call your primary care provider for questions or health needs.   ? Tell your doctor about your recent travel and your symptoms     o In a medical emergency, call 911 or go to the nearest emergency room.      Standard Precautions: Handwashing  Frequent and thorough handwashing is the best way to prevent infection. The sooner you wash your hands after exposure, the less likely you are to catch or spread infection.      Washing your hands is the best way to stop the spread of infection.   When to wash your hands  Wash your hands regularly throughout the day, especially:   ? When first arriving at work and before leaving  ? Before and after caring for a patient  ? After touching blood or any other body fluid or substance, broken skin, or mucous membranes  ? After touching an object or surface that is or may be contaminated  ? Before and after eating, drinking, smoking, and after using the restroom  ? After coughing, sneezing, or blowing your nose  How to wash your hands  First, carefully remove gloves and other PPE. Follow your facility?s guidelines for dealing with jewelry. Then follow these steps:   ? Use clean, running water and plenty of soap. Work up a good Education officer, museum by rubbing your hands together.  ? Clean your whole hand, under your nails, between your fingers, and up your wrists. Rub vigorously. Wash for at least 20?seconds (or the time it takes to sing happy birthday twice. (CDC).  ? Rinse your hands well. Let the water run off your fingertips, not up your wrists.  ? Dry your hands well with clean paper towels. Or use an air dryer machine. Use paper towels to turn off the faucet and open the door so you don?t recontaminate your hands.  ? If no sink is available or your hands are not visibly soiled, use the alcohol-based hand sanitizer approved by your facility. Be sure it contains no less than 60% alcohol. These products are fast-acting and significantly reduce the number of germs on the skin. Unfortunately, they don't work on all types of germs in the hospital.?Wash with soap and water as soon as you can.  StayWell last reviewed this educational content on 08/10/2019  ? 2000-2021 [employer], [place]. All rights reserved. This information is not intended as a substitute for professional medical care. Always follow your healthcare professional's instructions.          Falls Can  Be Prevented  Elderly Trauma Patients  Talk to Your Doctor  ? Talk to your healthcare provider to assess your risk for falling. Ask them how you can prevent falling.  ? Ask your healthcare provider or pharmacist to review your medicines. ?Some medicine combinations might make you dizzy or sleepy. This should include prescription medicines and over-the-counter medicines.  ? Ask your healthcare provider about taking vitamin D supplements.  ?  Do Strength and Balance Exercises  ? Do exercises that make your legs stronger and improve your balance. Tai Chi is a good example of this kind of exercise.  ?  Have Your Eyes Checked  ? Have your eyes checked by an eye doctor once a year. Update your eyeglasses if needed.  ? Bifocals can make things seem closer or farther away than they really are. This can affect your balance and make you more likely to fall. If you use bifocals, have a pair of glasses without bifocals for outdoor activities, such as walking.  ?  Make Your Home Safer  ? Remove things you could trip over.  ? Add grab bars inside and outside your tub or shower and next to the toilet.  ? Put railings on both sides of stairs.  ? Make sure your home has lots of light by adding more or brighter light bulbs.

## 2020-04-06 ENCOUNTER — Encounter: Admit: 2020-04-06 | Discharge: 2020-04-06 | Payer: MEDICAID

## 2020-04-06 MED ORDER — TIZANIDINE 4 MG PO TAB
4 mg | ORAL_TABLET | Freq: Three times a day (TID) | ORAL | 0 refills | Status: AC
Start: 2020-04-06 — End: ?

## 2020-05-07 ENCOUNTER — Encounter: Admit: 2020-05-07 | Discharge: 2020-05-07 | Payer: MEDICAID

## 2020-06-18 ENCOUNTER — Encounter: Admit: 2020-06-18 | Discharge: 2020-06-18 | Payer: MEDICAID

## 2020-06-18 NOTE — Telephone Encounter
Patient called into clinic stating that she wasn't feeling well and wanted to reschedule her telehealth appointment for today and wanted Dr. Lorel Monaco to extend her work leave. This RN discussed the patient with Dr. Lorel Monaco and we will fax her xray orders to [facility] in [facility]. Joe and call patient to discuss results afterwards. This RN will fax the orders today. No further questions or concerns at this time.

## 2020-06-30 ENCOUNTER — Encounter: Admit: 2020-06-30 | Discharge: 2020-06-30 | Payer: MEDICAID

## 2020-06-30 NOTE — Telephone Encounter
Patient called into clinic and wanted to know the results of her recent xrays and if Dr. Lorel Monaco could extend her work leave. Patient is reporting increased pain in her hip and back and inability to stand for long periods of time.  This RN noted that patient has not been seen by this office since 04/05/20. This RN suggested patient make an appointment with Dr. Lorel Monaco and transferred patient to scheduling. No further questions or concerns at this time.

## 2020-07-05 ENCOUNTER — Encounter: Admit: 2020-07-05 | Discharge: 2020-07-05 | Payer: MEDICAID

## 2021-02-10 ENCOUNTER — Encounter

## 2021-03-10 ENCOUNTER — Encounter

## 2021-03-10 ENCOUNTER — Inpatient Hospital Stay: Admit: 2021-03-10 | Payer: BLUE CROSS/BLUE SHIELD | Primary: Family Medicine

## 2021-03-10 DIAGNOSIS — Z1231 Encounter for screening mammogram for malignant neoplasm of breast: Secondary | ICD-10-CM

## 2022-01-10 ENCOUNTER — Ambulatory Visit
Admit: 2022-01-10 | Discharge: 2022-01-10 | Payer: BLUE CROSS/BLUE SHIELD | Attending: Family Medicine | Primary: Family Medicine

## 2022-01-10 DIAGNOSIS — E039 Hypothyroidism, unspecified: Secondary | ICD-10-CM

## 2022-01-10 MED ORDER — LEVOTHYROXINE SODIUM 137 MCG PO TABS
137 MCG | ORAL_TABLET | Freq: Every day | ORAL | 0 refills | Status: DC
Start: 2022-01-10 — End: 2022-07-27

## 2022-01-10 MED ORDER — SHINGRIX 50 MCG/0.5ML IM SUSR
50 MCG/0.5ML | INTRAMUSCULAR | 0 refills | Status: AC
Start: 2022-01-10 — End: 2022-07-09

## 2022-01-10 NOTE — Progress Notes (Signed)
Family Medicine Visit Note- [facility]    Chief Complaint:  Chief Complaint   Patient presents with    Establish Care     Needs a refill on thyroid medication         PHQ-9 Total Score: 0 (01/10/2022  3:35 PM)       History of Present Illness:  Rhonda Simon (DOB:  February 28, 1970) is a 52 y.o. female, here for evaluation of the following chief complaint(s):  Establish Care (Needs a refill on thyroid medication )    Perimenopause - trouble sleeping, high weight gain. No more hot flashes   Weight - E2M - lost 23 pounds in the past. Was going to try E2M and that is her goal this year. Gained a lot after menopause   Current Outpatient Medications   Medication Sig Dispense Refill    levothyroxine (SYNTHROID) 137 MCG tablet Take 1 tablet by mouth Daily 90 tablet 0    zoster recombinant adjuvanted vaccine (SHINGRIX) 50 MCG/0.5ML SUSR injection Inject 0.5 mLs into the muscle See Admin Instructions 1 dose now and repeat in 2-6 months 0.5 mL 0     No current facility-administered medications for this visit.        Past Medical History:   Diagnosis Date    Hypothyroidism ? age 45      History reviewed. No pertinent surgical history.   Family History   Problem Relation Age of Onset    Heart Attack Maternal Grandmother     Heart Disease Maternal Grandfather           Social History       Tobacco History       Smoking Status  Never      Smokeless Tobacco Use  Never                   Social History     Substance and Sexual Activity   Alcohol Use Yes    Alcohol/week: 10.0 standard drinks of alcohol    Types: 6 Glasses of wine, 4 Drinks containing 0.5 oz of alcohol per week      Patient has no known allergies.     Review of Systems:  Per HPI. Otherwise balance of general, CV, GI, psych, neuro ROS is negative    Objective    Vitals:    01/10/22 1536   BP: 118/70   Pulse: 60   Temp: 98 F (36.7 C)   SpO2: 98%   Weight: 98.2 kg (216 lb 6.4 oz)   Height: 1.676 m (5\' 6" )     Body mass index is 34.93 kg/m.   CONSTITUTIONAL:  well developed in NAD.  EYES: Anicteric sclerae; no lid-lag or proptosis.  RESPIRATORY: Normal respiratory effort. Lcab  NECK: no masses pr lyphadenopathy   CARDIOVASCULAR: No peripheral edema. Rrr no mrg  SKIN: No rash, lesions or ulcers to visible skin.  MUSCULOSKELETAL No digital cyanosis. Normal gait and station.  NEURO: Cranial nerves II--XII grossly intact.  PSYCH: Intact judgment and insight. A&OX3 with a cordial affect.      Labs:  No results found for: "WBC", "HGB", "HCT", "PLT", "CHOL", "TRIG", "HDL", "LDLDIRECT", "ALT", "AST", "NA", "K", "CL", "CREATININE", "BUN", "CO2", "TSH", "PSA", "INR", "GLUF", "LABA1C"       Assessment/Plan:  1. Acquired hypothyroidism  Assessment & Plan:  Acute on chronic.  Unsure of control.  Patient has not had labs checked in 2 years we will get a thyroid panel.  Refilled her  med.  Will adjust as necessary.   Orders:  -     Comprehensive Metabolic Panel; Future  -     CBC with Auto Differential  -     TSH; Future  -     T4, Free; Future  2. Class 1 obesity due to excess calories without serious comorbidity with body mass index (BMI) of 34.0 to 34.9 in adult  Assessment & Plan:  Acute on chronic.  Working uncontrolled.  Will do E2M again.  Had good success losing 23 pounds on each of them in the past.  Not really wanting to go on medication.  Does not like the side effects of hormone replacement therapy will work on lifestyle changes and we will follow-up.  We will check basic labs A1c and cholesterol   3. Screening for cholesterol level  -     Lipid Panel; Future  4. Screening for diabetes mellitus  -     Hemoglobin A1C; Future  5. Screening for cervical cancer  -     [facility] - Spann, Fabio Asa MD, Obstetrics & Gynecology, [address]  6. Encounter for screening mammogram for malignant neoplasm of breast  -     MAM TOMO DIGITAL SCREEN BILATERAL (PER PROTOCOL); Future      I have reviewed at least three labs this appointment.     All issues not specifically addressed in the text above are  chronic, stable, and mediations were refilled where appropriate. Will check related labwork where appropriate.      Hiram Comber, MD  Family Medicine   [facility]  01/10/2022   No follow-ups on file.

## 2022-01-10 NOTE — Assessment & Plan Note (Signed)
Acute on chronic.  Working uncontrolled.  Will do E2M again.  Had good success losing 23 pounds on each of them in the past.  Not really wanting to go on medication.  Does not like the side effects of hormone replacement therapy will work on lifestyle changes and we will follow-up.  We will check basic labs A1c and cholesterol

## 2022-01-10 NOTE — Assessment & Plan Note (Signed)
Acute on chronic.  Unsure of control.  Patient has not had labs checked in 2 years we will get a thyroid panel.  Refilled her med.  Will adjust as necessary.

## 2022-01-27 ENCOUNTER — Encounter

## 2022-01-27 LAB — COMPREHENSIVE METABOLIC PANEL
ALT: 27 U/L (ref 0–35)
AST: 20 U/L (ref 0–35)
Albumin/Globulin Ratio: 1.6 (ref 1.00–2.70)
Albumin: 4.6 g/dL (ref 3.5–5.2)
Alk Phosphatase: 82 U/L (ref 35–117)
Anion Gap: 13 mmol/L (ref 2–17)
BUN: 15 mg/dL (ref 6–20)
CO2: 26 mmol/L (ref 22–29)
Calcium: 10.2 mg/dL — ABNORMAL HIGH (ref 8.6–10.0)
Chloride: 101 mmol/L (ref 98–107)
Creatinine: 0.9 mg/dL (ref 0.5–1.0)
Est, Glom Filt Rate: 77 mL/min/1.73m (ref >=60)
Globulin: 2.9 g/dL (ref 1.9–4.4)
Glucose: 108 mg/dL — ABNORMAL HIGH (ref 70–99)
OSMOLALITY CALCULATED: 281 mOsm/kg (ref 270–287)
Potassium: 5.3 mmol/L (ref 3.5–5.3)
Sodium: 140 mmol/L (ref 135–145)
Total Bilirubin: 0.31 mg/dL (ref 0.00–1.20)
Total Protein: 7.5 g/dL (ref 6.4–8.3)

## 2022-01-27 LAB — T4, FREE: T4 Free: 1.41 ng/dL (ref 0.82–1.70)

## 2022-01-27 LAB — LIPID PANEL
Chol/HDL Ratio: 4.7 — ABNORMAL HIGH (ref 0.0–4.4)
Cholesterol: 278 mg/dL — ABNORMAL HIGH (ref 100–200)
HDL: 59 mg/dL (ref >=50)
LDL Cholesterol: 186 mg/dL — ABNORMAL HIGH (ref 0.0–100.0)
LDL/HDL Ratio: 3.2
Triglycerides: 165 mg/dL — ABNORMAL HIGH (ref 0–149)
VLDL: 33 mg/dL (ref 5.0–40.0)

## 2022-01-27 LAB — HEMOGLOBIN A1C
Est. Avg. Glucose, WB: 123
Est. Avg. Glucose-calculated: 133
Hemoglobin A1C: 5.9 % (ref 4.0–6.0)

## 2022-01-27 LAB — TSH: TSH, 3RD GENERATION: 7.31 mcIU/mL — ABNORMAL HIGH (ref 0.358–3.740)

## 2022-01-30 ENCOUNTER — Telehealth
Admit: 2022-01-30 | Discharge: 2022-01-30 | Payer: BLUE CROSS/BLUE SHIELD | Attending: Family Medicine | Primary: Family Medicine

## 2022-01-30 ENCOUNTER — Encounter

## 2022-01-30 DIAGNOSIS — R1010 Upper abdominal pain, unspecified: Secondary | ICD-10-CM

## 2022-01-30 MED ORDER — TOPIRAMATE 25 MG PO TABS
25 MG | ORAL_TABLET | Freq: Every evening | ORAL | 1 refills | Status: DC
Start: 2022-01-30 — End: 2023-01-30

## 2022-01-30 MED ORDER — ATORVASTATIN CALCIUM 20 MG PO TABS
20 MG | ORAL_TABLET | Freq: Every day | ORAL | 1 refills | Status: AC
Start: 2022-01-30 — End: 2022-11-22

## 2022-01-30 MED ORDER — PHENTERMINE HCL 15 MG PO CAPS
15 MG | ORAL_CAPSULE | Freq: Every morning | ORAL | 1 refills | Status: DC
Start: 2022-01-30 — End: 2022-07-27

## 2022-01-30 NOTE — Progress Notes (Addendum)
Family Medicine Visit Note- [facility]    Chief Complaint:  Chief Complaint   Patient presents with    Discuss Labs        No data recorded     History of Present Illness:  Rhonda Simon (DOB:  03-07-1970) is a 52 y.o. female, here for evaluation of the following chief complaint(s):  Discuss Labs    The 10-year ASCVD risk score (Arnett DK, et al., 2019) is: 1.8%    Values used to calculate the score:      Age: 77 years      Sex: Female      Is Non-Hispanic African American: No      Diabetic: No      Tobacco smoker: No      Systolic Blood Pressure: 244 mmHg      Is BP treated: No      HDL Cholesterol: 59 mg/dL      Total Cholesterol: 278 mg/dL    Current Outpatient Medications   Medication Sig Dispense Refill    atorvastatin (LIPITOR) 20 MG tablet Take 1 tablet by mouth daily 90 tablet 1    phentermine 15 MG capsule Take 1 capsule by mouth every morning for 180 days. Max Daily Amount: 15 mg 90 capsule 1    topiramate (TOPAMAX) 25 MG tablet Take 1 tablet by mouth nightly 90 tablet 1    levothyroxine (SYNTHROID) 137 MCG tablet Take 1 tablet by mouth Daily 90 tablet 0    zoster recombinant adjuvanted vaccine (SHINGRIX) 50 MCG/0.5ML SUSR injection Inject 0.5 mLs into the muscle See Admin Instructions 1 dose now and repeat in 2-6 months 0.5 mL 0     No current facility-administered medications for this visit.        Past Medical History:   Diagnosis Date    Hypothyroidism ? age 27      History reviewed. No pertinent surgical history.   Family History   Problem Relation Age of Onset    Heart Attack Maternal Grandmother     Heart Disease Maternal Grandfather           Social History       Tobacco History       Smoking Status  Never      Smokeless Tobacco Use  Never                   Social History     Substance and Sexual Activity   Alcohol Use Yes    Alcohol/week: 10.0 standard drinks of alcohol    Types: 6 Glasses of wine, 4 Drinks containing 0.5 oz of alcohol per week      Patient has no known allergies.      Review of Systems:  Per HPI. Otherwise balance of general, CV, GI, psych, neuro ROS is negative    Objective    There were no vitals filed for this visit.  There is no height or weight on file to calculate BMI.   CONSTITUTIONAL: well developed in NAD.  EYES: Anicteric sclerae; no lid-lag or proptosis.  RESPIRATORY: Normal respiratory effort.  CARDIOVASCULAR: No peripheral edema.  SKIN: No rash, lesions or ulcers to visible skin.  MUSCULOSKELETAL No digital cyanosis. Normal gait and station.  NEURO: Cranial nerves II--XII grossly intact.  PSYCH: Intact judgment and insight. A&OX3 with a cordial affect.    Labs:  Lab Results   Component Value Date    CHOL 278 (H) 01/27/2022    TRIG  165 (H) 01/27/2022    HDL 59 01/27/2022    ALT 27 01/27/2022    AST 20 01/27/2022    NA 140 01/27/2022    K 5.3 01/27/2022    CL 101 01/27/2022    CREATININE 0.9 01/27/2022    BUN 15 01/27/2022    CO2 26 01/27/2022    LABA1C 5.9 01/27/2022          Assessment/Plan:  1. Pain of upper abdomen  Assessment & Plan:  Acute.  Uncontrolled.  Had epigastric pain that radiated to the back.  Suspicious for pancreatitis we will check a lipase and CMP    Orders:  -     Lipase; Future  -     Comprehensive Metabolic Panel; Future  2. Family history of heart attack  3. Class 1 obesity due to excess calories without serious comorbidity with body mass index (BMI) of 34.0 to 34.9 in adult  Assessment & Plan:  Acute on chronic.  Uncontrolled.  Starting phentermine and Topamax to help with weight loss went over the risks and benefits of the medications   Orders:  -     phentermine 15 MG capsule; Take 1 capsule by mouth every morning for 180 days. Max Daily Amount: 15 mg, Disp-90 capsule, R-1Normal  4. Familial hypercholesteremia  -     atorvastatin (LIPITOR) 20 MG tablet; Take 1 tablet by mouth daily, Disp-90 tablet, R-1Normal  -     CT CARDIAC CALCIUM SCORING; Future  -     Comprehensive Metabolic Panel; Future  -     CBC with Auto Differential; Future  -      Lipid Panel; Future  -     Apolipoprotein B; Future  -     Lipoprotein A (LPA); Future  5. Prediabetes  Assessment & Plan:  New diagnosis.  Uncontrolled.  Is can work on weight loss and lifestyle changes to address this   Orders:  -     Hemoglobin A1C; Future  6. Mixed hyperlipidemia  Assessment & Plan:  Acute.  Very uncontrolled.  Start statin 20 mg.  Get CT calcium artery score.  Will also check LP(a) and apolipoprotein B fasting with next labs   7. Acquired hypothyroidism  Assessment & Plan:  Acute.  Uncontrolled.  TSH is 7 but free T4 level is normal we decided to repeat in 6 months rather than make an adjustment to her thyroid medication at this point.  She has no symptoms of hypothyroidism   Orders:  -     TSH; Future  -     T4, Free; Future  8. High calcium levels  Assessment & Plan:  Acute.  Uncontrolled.  Repeat lab       I have reviewed at least three labs this appointment.     All issues not specifically addressed in the text above are chronic, stable, and mediations were refilled where appropriate. Will check related labwork where appropriate.    This is a virtual face-to-face visit and verbal consent has been obtained from the patient.  Virtual visit video component was used.  Platform used for virtual face-to-face: My Chart Video Visit.       Hiram Comber, MD  Family Medicine   [facility]  01/30/2022   No follow-ups on file.

## 2022-01-30 NOTE — Assessment & Plan Note (Signed)
Acute on chronic.  Uncontrolled.  Starting phentermine and Topamax to help with weight loss went over the risks and benefits of the medications

## 2022-01-30 NOTE — Assessment & Plan Note (Signed)
Acute.  Very uncontrolled.  Start statin 20 mg.  Get CT calcium artery score.  Will also check LP(a) and apolipoprotein B fasting with next labs

## 2022-01-30 NOTE — Assessment & Plan Note (Addendum)
Acute.  Uncontrolled.  Repeat lab

## 2022-01-30 NOTE — Assessment & Plan Note (Signed)
Acute.  Uncontrolled.  Had epigastric pain that radiated to the back.  Suspicious for pancreatitis we will check a lipase and CMP

## 2022-01-30 NOTE — Assessment & Plan Note (Signed)
New diagnosis.  Uncontrolled.  Is can work on weight loss and lifestyle changes to address this

## 2022-01-30 NOTE — Assessment & Plan Note (Signed)
Acute.  Uncontrolled.  TSH is 7 but free T4 level is normal we decided to repeat in 6 months rather than make an adjustment to her thyroid medication at this point.  She has no symptoms of hypothyroidism

## 2022-01-31 LAB — COMPREHENSIVE METABOLIC PANEL
ALT: 20 U/L (ref 0–35)
AST: 17 U/L (ref 0–35)
Albumin/Globulin Ratio: 1.8 (ref 1.00–2.70)
Albumin: 4.8 g/dL (ref 3.5–5.2)
Alk Phosphatase: 73 U/L (ref 35–117)
Anion Gap: 11 mmol/L (ref 2–17)
BUN: 14 mg/dL (ref 6–20)
CO2: 28 mmol/L (ref 22–29)
Calcium: 9.9 mg/dL (ref 8.6–10.0)
Chloride: 103 mmol/L (ref 98–107)
Creatinine: 0.8 mg/dL (ref 0.5–1.0)
Est, Glom Filt Rate: 89 mL/min/1.73m (ref >=60)
Globulin: 2.6 g/dL (ref 1.9–4.4)
Glucose: 106 mg/dL — ABNORMAL HIGH (ref 70–99)
OSMOLALITY CALCULATED: 284 mOsm/kg (ref 270–287)
Potassium: 4.4 mmol/L (ref 3.5–5.3)
Sodium: 142 mmol/L (ref 135–145)
Total Bilirubin: 0.35 mg/dL (ref 0.00–1.20)
Total Protein: 7.4 g/dL (ref 6.4–8.3)

## 2022-01-31 LAB — LIPASE: Lipase: 33 U/L (ref 13–60)

## 2022-03-07 ENCOUNTER — Ambulatory Visit
Admit: 2022-03-07 | Discharge: 2022-03-07 | Payer: BLUE CROSS/BLUE SHIELD | Attending: Family Medicine | Primary: Family Medicine

## 2022-03-07 ENCOUNTER — Encounter

## 2022-03-07 DIAGNOSIS — N3000 Acute cystitis without hematuria: Secondary | ICD-10-CM

## 2022-03-07 LAB — AMB POC URINALYSIS DIP STICK MANUAL W/O MICRO
Bilirubin, Urine, POC: NEGATIVE
Blood (UA POC): NEGATIVE
Glucose, Urine, POC: NEGATIVE
Ketones, Urine, POC: NEGATIVE
Nitrite, Urine, POC: NEGATIVE
Protein, Urine, POC: NEGATIVE
Specific Gravity, Urine, POC: 1.005 (ref 1.001–1.035)
Urobilinogen, POC: NORMAL
pH, Urine, POC: 7 (ref 4.6–8.0)

## 2022-03-07 MED ORDER — HYDROCORTISONE ACETATE 25 MG RE SUPP
25 MG | Freq: Two times a day (BID) | RECTAL | 0 refills | Status: AC
Start: 2022-03-07 — End: 2022-07-27

## 2022-03-07 MED ORDER — NITROFURANTOIN MONOHYD MACRO 100 MG PO CAPS
100 | ORAL_CAPSULE | Freq: Two times a day (BID) | ORAL | 0 refills | Status: AC
Start: 2022-03-07 — End: 2022-03-12

## 2022-03-07 NOTE — Assessment & Plan Note (Signed)
Acute on chronic improving is already lost 12 pounds since January 2.  Will start to incorporate more exercise tolerating phentermine and Topamax well

## 2022-03-07 NOTE — Assessment & Plan Note (Signed)
Acute. Uncontrolled. Treat with macrobid five days will clx results

## 2022-03-07 NOTE — Progress Notes (Signed)
Family Medicine Visit Note- [facility]    Chief Complaint:  Chief Complaint   Patient presents with    Abdominal Pain     X2 days.         No data recorded     History of Present Illness:  Rhonda Simon (DOB:  1970-09-21) is a 52 y.o. female, here for evaluation of the following chief complaint(s):  Abdominal Pain (X2 days. )    Weight - down 12# since January second, just starting to really walk. Less appetite on phentermine. No issues   HLD - lipitor medication fine   Sunday went to relator event and they had oysters and she was drinking a little wine. Cramping pains in the lower abdomen. Thought she had gas. Still there Monday. Tightness. Pooping soft. Was straining to poop or hemorrhoid or fissure. That has not gone away. Both sides. No fever chills. No nausea or emesis. No diarrhea. No sore throat runny nose. No body aches     Current Outpatient Medications   Medication Sig Dispense Refill    nitrofurantoin, macrocrystal-monohydrate, (MACROBID) 100 MG capsule Take 1 capsule by mouth 2 times daily for 5 days 10 capsule 0    hydrocortisone (ANUSOL-HC) 25 MG suppository Place 1 suppository rectally in the morning and 1 suppository in the evening. 2 suppository 0    atorvastatin (LIPITOR) 20 MG tablet Take 1 tablet by mouth daily 90 tablet 1    phentermine 15 MG capsule Take 1 capsule by mouth every morning for 180 days. Max Daily Amount: 15 mg 90 capsule 1    topiramate (TOPAMAX) 25 MG tablet Take 1 tablet by mouth nightly 90 tablet 1    levothyroxine (SYNTHROID) 137 MCG tablet Take 1 tablet by mouth Daily 90 tablet 0    zoster recombinant adjuvanted vaccine [facility]) 50 MCG/0.5ML SUSR injection Inject 0.5 mLs into the muscle See Admin Instructions 1 dose now and repeat in 2-6 months (Patient not taking: Reported on 03/07/2022) 0.5 mL 0     No current facility-administered medications for this visit.        Past Medical History:   Diagnosis Date    Hypothyroidism ? age 22      History reviewed. No  pertinent surgical history.   Family History   Problem Relation Age of Onset    Heart Attack Maternal Grandmother     Heart Disease Maternal Grandfather           Social History       Tobacco History       Smoking Status  Never      Smokeless Tobacco Use  Never                   Social History     Substance and Sexual Activity   Alcohol Use Yes    Alcohol/week: 10.0 standard drinks of alcohol    Types: 6 Glasses of wine, 4 Drinks containing 0.5 oz of alcohol per week      Patient has no known allergies.     Review of Systems:  Per HPI. Otherwise balance of general, CV, GI, psych, neuro ROS is negative    Objective    Vitals:    03/07/22 1549   BP: 108/70   Pulse: 82   Temp: 98.1 F (36.7 C)   TempSrc: Oral   SpO2: 99%   Weight: 93.8 kg (206 lb 12.8 oz)   Height: 1.676 m (5' 6"$ )  Body mass index is 33.38 kg/m.   CONSTITUTIONAL: well developed in NAD.  EYES: Anicteric sclerae; no lid-lag or proptosis.  RESPIRATORY: Normal respiratory effort.  CARDIOVASCULAR: No peripheral edema.  SKIN: No rash, lesions or ulcers to visible skin.  MUSCULOSKELETAL No digital cyanosis. Normal gait and station.  NEURO: Cranial nerves II--XII grossly intact.  PSYCH: Intact judgment and insight. A&OX3 with a cordial affect.    Labs:  Lab Results   Component Value Date    CHOL 278 (H) 01/27/2022    TRIG 165 (H) 01/27/2022    HDL 59 01/27/2022    ALT 20 01/30/2022    AST 17 01/30/2022    NA 142 01/30/2022    K 4.4 01/30/2022    CL 103 01/30/2022    CREATININE 0.8 01/30/2022    BUN 14 01/30/2022    CO2 28 01/30/2022    LABA1C 5.9 01/27/2022          Assessment/Plan:  1. Acute cystitis without hematuria  Assessment & Plan:  Acute. Uncontrolled. Treat with macrobid five days will clx results    Orders:  -     Culture, Urine; Future  -     AMB POC URINALYSIS DIP STICK MANUAL W/O MICRO  -     nitrofurantoin, macrocrystal-monohydrate, (MACROBID) 100 MG capsule; Take 1 capsule by mouth 2 times daily for 5 days, Disp-10 capsule, R-0Normal  2.  Acquired hypothyroidism  Assessment & Plan:  Acute on chronic unsure if controlled but started Synthroid we will repeat at next appointment   3. Class 1 obesity due to excess calories without serious comorbidity with body mass index (BMI) of 34.0 to 34.9 in adult  Assessment & Plan:  Acute on chronic improving is already lost 12 pounds since January 2.  Will start to incorporate more exercise tolerating phentermine and Topamax well  4. Familial hypercholesteremia  Assessment & Plan:  Chronic stable on statin   5. Anal fissure  Assessment & Plan:  Acute uncontrolled will send steroid suppository   Orders:  -     hydrocortisone (ANUSOL-HC) 25 MG suppository; Place 1 suppository rectally in the morning and 1 suppository in the evening., Disp-2 suppository, R-0Normal      I have reviewed at least three labs this appointment.     All issues not specifically addressed in the text above are chronic, stable, and mediations were refilled where appropriate. Will check related labwork where appropriate.      Hiram Comber, MD  Family Medicine   [facility]  03/07/2022   No follow-ups on file.

## 2022-03-07 NOTE — Assessment & Plan Note (Signed)
Acute on chronic unsure if controlled but started Synthroid we will repeat at next appointment

## 2022-03-07 NOTE — Assessment & Plan Note (Signed)
Chronic stable on statin

## 2022-03-07 NOTE — Assessment & Plan Note (Signed)
Acute uncontrolled will send steroid suppository

## 2022-03-08 LAB — CULTURE, URINE: FINAL REPORT: 40000

## 2022-06-20 ENCOUNTER — Encounter: Attending: Student in an Organized Health Care Education/Training Program | Primary: Family Medicine

## 2022-06-27 ENCOUNTER — Encounter

## 2022-07-27 ENCOUNTER — Ambulatory Visit
Admit: 2022-07-27 | Discharge: 2022-07-27 | Payer: BLUE CROSS/BLUE SHIELD | Attending: Family Medicine | Primary: Family Medicine

## 2022-07-27 ENCOUNTER — Encounter

## 2022-07-27 DIAGNOSIS — Z0000 Encounter for general adult medical examination without abnormal findings: Secondary | ICD-10-CM

## 2022-07-27 LAB — CBC WITH AUTO DIFFERENTIAL
Basophils %: 0.9 % (ref 0.0–2.0)
Basophils Absolute: 0.1 10*3/uL (ref 0.0–0.2)
Eosinophils %: 4.4 % (ref 0.0–7.0)
Eosinophils Absolute: 0.3 10*3/uL (ref 0.0–0.5)
Hematocrit: 40.7 % (ref 34.0–47.0)
Hemoglobin: 13.8 g/dL (ref 11.5–15.7)
Immature Grans (Abs): 0.02 10*3/uL (ref 0.00–0.06)
Immature Granulocytes %: 0.3 % (ref 0.0–0.6)
Lymphocytes Absolute: 2.5 10*3/uL (ref 1.0–3.2)
Lymphocytes: 39.2 % (ref 15.0–45.0)
MCH: 30.5 pg (ref 27.0–34.5)
MCHC: 33.9 g/dL (ref 30.0–36.0)
MCV: 89.8 fL (ref 81.0–99.0)
MPV: 11.1 fL (ref 7.0–12.2)
Monocytes %: 8.6 % (ref 4.0–12.0)
Monocytes Absolute: 0.6 10*3/uL (ref 0.3–1.0)
NRBC Absolute: 0 10*3/uL (ref 0.000–0.012)
NRBC Automated: 0 % (ref 0.0–0.2)
Neutrophils %: 46.6 % (ref 42.0–74.0)
Neutrophils Absolute: 3 10*3/uL (ref 1.6–7.3)
Platelets: 250 10*3/uL (ref 140–440)
RBC: 4.53 x10e6/mcL (ref 3.60–5.20)
RDW: 13.5 % (ref 10.0–17.0)
WBC: 6.4 10*3/uL (ref 3.8–10.6)

## 2022-07-27 LAB — LIPID PANEL
Chol/HDL Ratio: 2.8 (ref 0.0–4.4)
Cholesterol, Total: 173 mg/dL (ref 100–200)
HDL: 61 mg/dL (ref >=50)
LDL Cholesterol: 82.8 mg/dL (ref 0.0–100.0)
LDL/HDL Ratio: 1.4
Triglycerides: 146 mg/dL (ref 0–149)
VLDL: 29.2 mg/dL (ref 5.0–40.0)

## 2022-07-27 LAB — COMPREHENSIVE METABOLIC PANEL
ALT: 19 U/L (ref 0–35)
AST: 18 U/L (ref 0–35)
Albumin/Globulin Ratio: 1.4 (ref 1.00–2.70)
Albumin: 4.6 g/dL (ref 3.5–5.2)
Alk Phosphatase: 108 U/L (ref 35–117)
Anion Gap: 13 mmol/L (ref 2–17)
BUN: 14 mg/dL (ref 6–20)
CO2: 24 mmol/L (ref 22–29)
Calcium: 9.9 mg/dL (ref 8.5–10.7)
Chloride: 104 mmol/L (ref 98–107)
Creatinine: 0.9 mg/dL (ref 0.5–1.0)
Est, Glom Filt Rate: 76 mL/min/1.73m (ref >=60)
Globulin: 3.4 g/dL (ref 1.9–4.4)
Glucose: 120 mg/dL — ABNORMAL HIGH (ref 70–99)
Osmolaliy Calculated: 283 mOsm/kg (ref 270–287)
Potassium: 4.1 mmol/L (ref 3.5–5.3)
Sodium: 141 mmol/L (ref 135–145)
Total Bilirubin: 0.46 mg/dL (ref 0.00–1.20)
Total Protein: 8 g/dL (ref 5.7–8.3)

## 2022-07-27 LAB — HEMOGLOBIN A1C
Estimated Avg Glucose: 120
Estimated Avg Glucose: 129
Hemoglobin A1C: 5.8 % (ref 4.0–6.0)

## 2022-07-27 LAB — TSH: TSH, 3rd Generation: 0.033 mcIU/mL — ABNORMAL LOW (ref 0.358–3.740)

## 2022-07-27 LAB — T4, FREE: T4 Free: 1.8 ng/dL — ABNORMAL HIGH (ref 0.82–1.70)

## 2022-07-27 MED ORDER — LEVOTHYROXINE SODIUM 137 MCG PO TABS
137 MCG | ORAL_TABLET | Freq: Every day | ORAL | 1 refills | Status: DC
Start: 2022-07-27 — End: 2022-07-27

## 2022-07-27 MED ORDER — PHENTERMINE HCL 15 MG PO CAPS
15 MG | ORAL_CAPSULE | Freq: Every morning | ORAL | 1 refills | Status: AC
Start: 2022-07-27 — End: 2023-01-23

## 2022-07-27 MED ORDER — LEVOTHYROXINE SODIUM 125 MCG PO TABS
125 MCG | ORAL_TABLET | Freq: Every day | ORAL | 1 refills | Status: AC
Start: 2022-07-27 — End: 2023-01-30

## 2022-07-27 NOTE — Assessment & Plan Note (Addendum)
Acute on chronic uncontrolled overdose on thyroid medication now that she is lost 21 pounds or can adjust her dose down repeat labs in 6 months

## 2022-07-27 NOTE — Assessment & Plan Note (Signed)
Acute on chronic unsure of control having calcium artery scan will get labs continue statin

## 2022-07-27 NOTE — Assessment & Plan Note (Signed)
Acute on chronic improving  has lost a total of 22 pounds ran out of phentermine issue with the pharmacy will restart on 50 mg pump up to 30 mg continue Topamax follow-up in 6 months she is gena work on starting exercise

## 2022-07-27 NOTE — Assessment & Plan Note (Signed)
Acute on chronic unsure if controlled check labs

## 2022-07-27 NOTE — Progress Notes (Addendum)
CHIEF COMPLAINT:  Chief Complaint   Patient presents with    Annual Exam    Discuss Labs        HISTORY OF PRESENT ILLNESS:  Rhonda Simon is a 52 y.o. female  who presents for CPE and other issues  Weight - on phentermine and topomax. Down 10 pounds since February   HLD - CAS upcoming; on statin   Low T on synthroid   Did not get labs done before this appointment  PHQ:      01/10/2022     3:35 PM   PHQ-9    Little interest or pleasure in doing things 0   Feeling down, depressed, or hopeless 0   PHQ-2 Score 0   PHQ-9 Total Score 0       CURRENT MEDICATION LIST:    Current Outpatient Medications   Medication Sig Dispense Refill    phentermine 15 MG capsule Take 2 capsules by mouth every morning for 180 days. Max Daily Amount: 30 mg 180 capsule 1    levothyroxine (SYNTHROID) 125 MCG tablet Take 1 tablet by mouth daily 90 tablet 1    atorvastatin (LIPITOR) 20 MG tablet Take 1 tablet by mouth daily 90 tablet 1    topiramate (TOPAMAX) 25 MG tablet Take 1 tablet by mouth nightly 90 tablet 1     No current facility-administered medications for this visit.        ALLERGIES:    No Known Allergies     HISTORY:  Past Medical History:   Diagnosis Date    Hypothyroidism ? age 29      History reviewed. No pertinent surgical history.   Social History     Socioeconomic History    Marital status: Married     Spouse name: Not on file    Number of children: Not on file    Years of education: Not on file    Highest education level: Not on file   Occupational History    Not on file   Tobacco Use    Smoking status: Never    Smokeless tobacco: Never   Vaping Use    Vaping Use: Never used   Substance and Sexual Activity    Alcohol use: Yes     Alcohol/week: 10.0 standard drinks of alcohol     Types: 6 Glasses of wine, 4 Drinks containing 0.5 oz of alcohol per week    Drug use: Never    Sexual activity: Yes     Partners: Male   Other Topics Concern    Not on file   Social History Narrative    Not on file     Social Determinants of Health      Financial Resource Strain: Not on file   Food Insecurity: Not on file   Transportation Needs: Not on file   Physical Activity: Not on file   Stress: Not on file   Social Connections: Not on file   Intimate Partner Violence: Not on file   Housing Stability: Not on file      Family History   Problem Relation Age of Onset    Heart Attack Maternal Grandmother     Heart Disease Maternal Grandfather         REVIEW OF SYSTEMS:  Per HPI. Otherwise balance of 10 point ROS is negative.     PHYSICAL EXAM:  Vital Signs -   Visit Vitals  BP 110/70   Pulse 60   Temp 97.8 F (36.6 C) (Oral)  Ht 1.676 m (5\' 6" )   Wt 89.3 kg (196 lb 12.8 oz)   SpO2 98%   BMI 31.76 kg/m        GENERAL APPEARANCE: well developed, well nourished, alert and oriented X 3, no acute distress, appropriate affect  HEAD: normocephalic, atraumatic.   EYES: pupils equal, round, reactive to light.   EARS: normal.   NOSE: sinuses nontender bilaterally.   ORAL CAVITY: mucosa moist.   THROAT: clear.   NECK: neck supple, full range of motion, no cervical lymphadenopathy.   LYMPH NODES: no cervical, supraclavicular adenopathy  CARDIOVASCULAR: no pedal edema or varicosities, good peripheral pulsations.   HEART: no murmurs, normal rate and regular rhythm, S1, S2 normal.   LUNGS: clear to auscultation bilaterally.   ABDOMEN: no hepatosplenomegaly, no masses palpable, soft, nontender, nondistended.   SKIN: no suspicious lesions, warm and dry.   MUSCULOSKELETAL: full range of motion.   BACK: normal, no scoliosis, spine nontender to palpation.   EXTREMITIES: no edema, clubbing or cyanosis.   PERIPHERAL PULSES: palpable in all 4 extremities.   NEUROLOGIC: nonfocal, motor strength normal upper and lower extremities, sensory exam intact.   PSYCH: alert, oriented, cognitive function intact, judgment and insight good, mood/affect appropriate.         LABS  No results found for this visit on 07/27/22.  Orders Only on 03/07/2022   Component Date Value Ref Range Status     FINAL REPORT 03/07/2022 40,000 cfu/ml Mixed Gram Positive organisms isolated. More than three   Final    FINAL REPORT 03/07/2022 organisms indicate contamination.   Final       IMPRESSION/PLAN    1. Annual visit for general adult medical examination without abnormal findings  2. Class 1 obesity due to excess calories without serious comorbidity with body mass index (BMI) of 34.0 to 34.9 in adult  Assessment & Plan:  Acute on chronic improving  has lost a total of 22 pounds ran out of phentermine issue with the pharmacy will restart on 50 mg pump up to 30 mg continue Topamax follow-up in 6 months she is gena work on starting exercise  Orders:  -     phentermine 15 MG capsule; Take 2 capsules by mouth every morning for 180 days. Max Daily Amount: 30 mg, Disp-180 capsule, R-1Normal  3. Prediabetes  Assessment & Plan:  Acute on chronic unsure if controlled check labs   Orders:  -     Comprehensive Metabolic Panel; Future  -     CBC with Auto Differential; Future  -     Hemoglobin A1C; Future  4. Acquired hypothyroidism  Assessment & Plan:  Acute on chronic uncontrolled overdose on thyroid medication now that she is lost 21 pounds or can adjust her dose down repeat labs in 6 months  Orders:  -     TSH; Future  -     T4, Free; Future  5. Familial hypercholesteremia  Assessment & Plan:  Acute on chronic unsure of control having calcium artery scan will get labs continue statin   Orders:  -     Lipid Panel; Future  6. Class 1 obesity due to excess calories without serious comorbidity with body mass index (BMI) of 31.0 to 31.9 in adult  Assessment & Plan:  Acute on chronic improving  has lost a total of 22 pounds ran out of phentermine issue with the pharmacy will restart on 50 mg pump up to 30 mg continue Topamax follow-up in 6 months she  is gena work on starting exercise         Follow up and Dispositions:  Return in about 6 months (around 01/27/2023) for HLD.       Trinidad Curet, MD

## 2022-07-27 NOTE — Addendum Note (Signed)
Addended by: Trinidad Curet on: 07/27/2022 02:34 PM     Modules accepted: Orders

## 2022-07-28 LAB — APOLIPOPROTEIN B: Apolipoprotein B: 78 mg/dL (ref <90)

## 2022-08-01 LAB — LIPOPROTEIN A (LPA): Lipoprotein (a): <8.4 nmol/L (ref <75.0)

## 2022-08-03 ENCOUNTER — Inpatient Hospital Stay: Admit: 2022-08-03 | Attending: Family Medicine | Primary: Family Medicine

## 2022-08-03 DIAGNOSIS — E7801 Familial hypercholesterolemia: Secondary | ICD-10-CM

## 2022-11-02 ENCOUNTER — Encounter
Admit: 2022-11-02 | Discharge: 2022-11-02 | Payer: BLUE CROSS/BLUE SHIELD | Attending: Student in an Organized Health Care Education/Training Program | Primary: Family Medicine

## 2022-11-02 DIAGNOSIS — Z7689 Persons encountering health services in other specified circumstances: Secondary | ICD-10-CM

## 2022-11-02 MED ORDER — CLOBETASOL PROPIONATE 0.05 % EX OINT
0.05 | CUTANEOUS | 1 refills | Status: AC
Start: 2022-11-02 — End: ?

## 2022-11-02 MED ORDER — ESTRADIOL 0.1 MG/GM VA CREA
0.1 | VAGINAL | 3 refills | Status: AC
Start: 2022-11-02 — End: ?

## 2022-11-02 NOTE — Progress Notes (Signed)
Annual GYN Exam    Rhonda Simon is a [id number], 52 y.o. post-menopausal female who presents today for her annual gyn exam. PMH significant for lichen sclerosus. Endorses vaginal itching and dryness. Her OBGYN is no longer practicing and she has not had refills of clobetasol and estradiol creams.  Doing well with phentermine for weight loss prescribed by PCP.     Postmenopausal GYN History  Age at menopause 74  No LMP recorded. Patient is postmenopausal.  Vaginal Bleeding: No  Vasomotor symptoms: vaginal dryness. Moderate night sweats.    reports being sexually active and has had partner(s) who are female.     Health Maintenance:  Abnormal Pap: No  Last Pap result: 57yrs ago Normal   Mammogram Result (most recent):   MAM TOMO DIGITAL SCREEN BILATERAL 03/10/2021    Narrative  SCREENING MAMMOGRAM: 03/10/21    INDICATION: Z12.31,Encounter for screening mammogram for malignant neoplasm of  breast,ICD-10-CM.    COMPARISON: 2020 and 2018 and 2017    TECHNIQUE: BILATERAL 3D tomosynthesis and 2D digital mammography.    BREAST DENSITY: Heterogeneously dense, which may obscure small masses    FINDINGS:    Right: No suspicious masses, calcifications, or architectural distortion.    Left: No suspicious masses, calcifications, or architectural distortion.    Impression  No suspicious mammographic finding.    OVERALL BIRADS: 1: Negative    RECOMMENDATION(S):  Bilateral Screening Mammogram in 1 year              A result letter will be sent to the patient.    Colonoscopy: up to date     Medical History:    Past Medical History:   Diagnosis Date    Hypothyroidism ? age 52    Lichen sclerosus      Surgical history confirmed with patient.  has no past surgical history on file.     Current Outpatient Medications   Medication Sig Dispense Refill    clobetasol (TEMOVATE) 0.05 % ointment Apply topically 2 times daily for two weeks then twice a week 1 each 1    estradiol (ESTRACE VAGINAL) 0.1 MG/GM vaginal cream Insert 1g with applicator  vaginally and a pea sized amount externally nightly for two weeks, then use twice weekly. 1 each 3    phentermine 15 MG capsule Take 2 capsules by mouth every morning for 180 days. Max Daily Amount: 30 mg 180 capsule 1    levothyroxine (SYNTHROID) 125 MCG tablet Take 1 tablet by mouth daily 90 tablet 1    atorvastatin (LIPITOR) 20 MG tablet Take 1 tablet by mouth daily 90 tablet 1    topiramate (TOPAMAX) 25 MG tablet Take 1 tablet by mouth nightly 90 tablet 1     No current facility-administered medications for this visit.       Allergies: Patient has no known allergies.     Tobacco History:  reports that she has never smoked. She has never used smokeless tobacco.  Alcohol Abuse:  reports current alcohol use of about 10.0 standard drinks of alcohol per week.  Drug Abuse:  reports no history of drug use.    Family Medical/Cancer History:   Family History   Problem Relation Age of Onset    Heart Attack Maternal Grandmother     Heart Disease Maternal Grandfather         Review of Systems - History obtained from the patient  Constitutional: negative for weight loss, fever, night sweats  HEENT: negative for hearing loss, earache, congestion,  snoring, sorethroat  CV: negative for chest pain, palpitations, edema  Resp: negative for cough, shortness of breath, wheezing  GI: negative for change in bowel habits, abdominal pain, black or bloody stools  GU: negative for frequency, dysuria, hematuria, vaginal discharge  MSK: negative for back pain, joint pain, muscle pain  Breast: negative for breast lumps, nipple discharge, galactorrhea  Skin :negative for itching, rash, hives  Neuro: negative for dizziness, headache, confusion, weakness  Psych: negative for anxiety, depression, change in mood  Heme/lymph: negative for bleeding, bruising, pallor    General Examination:  Vitals:    11/02/22 0857   BP: 110/80   Pulse: 67   SpO2: 98%       GENERAL APPEARANCE: pleasant, well nourished, well developed, in no acute distress, female.    HEAD: normocephalic, atraumatic.   NECK: neck supple, full range of motion, no cervical lymphadenopathy, no thyromegaly.   HEART: normal rate, warm and well perfused  LUNGS: normal work of breathing on room air  BREASTS: normal, no masses palpable bilaterally, no discharge, no dimpling no axillary lymphadenopathy.   ABDOMEN: bowel sounds present, soft, nontender, nondistended, no hepatosplenomegaly, no masses palpable.   SKIN: no suspicious lesions, warm and dry.   PSYCH: alert, oriented.   Gynecological:  EXTERNAL GENITALIA: introitus atrophic, agglutination of labia minora, no lesions.   URETHRA: normal.   VAGINA: atrophic, pink mucosa without any lesions, normal discharge.   CERVIX: pinpoint appearance, no cervical movement tenderness, no lesions or discharge or bleeding.   UTERUS: normal mobility, nontender, normal size.   ADNEXA: no mass, nontender.      Assessment and Plan:  1. Establishing care with new doctor, encounter for    2. Well woman exam  Routine gynecologic examination  Her current medical status is satisfactory with no evidence of significant gynecologic issues.  Counseled re: diet, exercise, healthy lifestyle  Return for yearly wellness visits  Discussed ASCCP guidelines.  Recommend annual mammogram    3. Encounter for screening for cervical cancer  - PAP IG, Aptima HPV and rfx 16/18,45 ([id number])    4. Lichen sclerosus  Clobetasol sent to pharmacy for treatment. Discussed if no improvement or worsening of symptoms with steroids, will perform biopsy.   - clobetasol (TEMOVATE) 0.05 % ointment; Apply topically 2 times daily for two weeks then twice a week  Dispense: 1 each; Refill: 1    5. Vaginal atrophy  Estradiol sent for atrophy. Discussed application recommendations.   - estradiol (ESTRACE VAGINAL) 0.1 MG/GM vaginal cream; Insert 1g with applicator vaginally and a pea sized amount externally nightly for two weeks, then use twice weekly.  Dispense: 1 each; Refill: 3    Return in about 3 months  (around 02/02/2023) for Follow Up.  Flo Shanks, MD

## 2022-11-09 LAB — PAP IG, APTIMA HPV AND RFX 16/18,45 (199305)
.: 0
HPV Aptima: NEGATIVE

## 2022-11-22 ENCOUNTER — Encounter

## 2022-11-22 MED ORDER — ATORVASTATIN CALCIUM 20 MG PO TABS
20 MG | ORAL_TABLET | Freq: Every day | ORAL | 1 refills | Status: DC
Start: 2022-11-22 — End: 2023-01-30

## 2022-12-12 ENCOUNTER — Encounter: Admit: 2022-12-12 | Payer: BLUE CROSS/BLUE SHIELD | Admitting: Family Medicine | Primary: Family Medicine

## 2022-12-12 VITALS — BP 122/82 | HR 66 | Temp 97.80000°F | Ht 66.0 in | Wt 195.8 lb

## 2022-12-12 DIAGNOSIS — J0110 Acute frontal sinusitis, unspecified: Secondary | ICD-10-CM

## 2022-12-12 MED ORDER — AMOXICILLIN-POT CLAVULANATE 875-125 MG PO TABS
875-125 | ORAL_TABLET | Freq: Two times a day (BID) | ORAL | 0 refills | Status: AC
Start: 2022-12-12 — End: 2022-12-22

## 2022-12-12 MED ORDER — PREDNISONE 20 MG PO TABS
20 MG | ORAL_TABLET | ORAL | 0 refills | Status: AC
Start: 2022-12-12 — End: 2023-01-30

## 2022-12-12 NOTE — Progress Notes (Signed)
 Family Medicine Visit Note- [facility]    Chief Complaint:  Chief Complaint   Patient presents with    Cough     Sinus drainage. Since sat.         No data recorded     History of Present Illness:  Rhonda Simon (DOB:  06-[name]-1971) is a 52 y.o. female, here for evaluation of the following chief complaint(s):  Cough (Sinus drainage. Since sat. )      History of Present Illness  The patient presents for evaluation of sinus drainage.    She reports experiencing sinus drainage, which has led to a persistent cough. Despite being able to breathe through her nose, she feels the drainage trickling down the back of her throat. She has been taking Mucinex, which she believes is helping to break up the congestion. She also took NyQuil before bed last night, which allowed her to sleep well until 5 am. However, she experienced difficulty breathing when she switched from sleeping on her left side to her right side.      Current Outpatient Medications   Medication Sig Dispense Refill    amoxicillin -clavulanate (AUGMENTIN ) 875-125 MG per tablet Take 1 tablet by mouth 2 times daily for 10 days 20 tablet 0    predniSONE  (DELTASONE ) 20 MG tablet Take 2 tabs days 1-3 and 1 tablet day 4-5 8 tablet 0    atorvastatin  (LIPITOR) 20 MG tablet Take 1 tablet by mouth daily 90 tablet 1    clobetasol  (TEMOVATE ) 0.05 % ointment Apply topically 2 times daily for two weeks then twice a week 1 each 1    estradiol  (ESTRACE  VAGINAL) 0.1 MG/GM vaginal cream Insert 1g with applicator vaginally and a pea sized amount externally nightly for two weeks, then use twice weekly. 1 each 3    levothyroxine  (SYNTHROID ) 125 MCG tablet Take 1 tablet by mouth daily 90 tablet 1    phentermine  15 MG capsule Take 2 capsules by mouth every morning for 180 days. Max Daily Amount: 30 mg (Patient not taking: Reported on 12/12/2022) 180 capsule 1    topiramate  (TOPAMAX ) 25 MG tablet Take 1 tablet by mouth nightly (Patient not taking: Reported on 12/12/2022) 90  tablet 1     No current facility-administered medications for this visit.        Past Medical History:   Diagnosis Date    Hypothyroidism ? age 39    Lichen sclerosus       History reviewed. No pertinent surgical history.   Family History   Problem Relation Age of Onset    Heart Attack Maternal Grandmother     Heart Disease Maternal Grandfather           Social History       Tobacco History       Smoking Status  Never      Smokeless Tobacco Use  Never                   Social History     Substance and Sexual Activity   Alcohol Use Yes    Alcohol/week: 10.0 standard drinks of alcohol    Types: 6 Glasses of wine, 4 Drinks containing 0.5 oz of alcohol per week      Patient has no known allergies.     Review of Systems:  Per HPI. Otherwise balance of general, CV, GI, psych, neuro ROS is negative    Objective    Vitals:    12/12/22  0929   BP: 122/82   Pulse: 66   Temp: 97.8 F (36.6 C)   TempSrc: Oral   SpO2: 97%   Weight: 88.8 kg (195 lb 12.8 oz)   Height: 1.676 m (5\' 6" )     Body mass index is 31.6 kg/m.   CONSTITUTIONAL: well developed in NAD.  EYES: Anicteric sclerae; no lid-lag or proptosis.  RESPIRATORY: Normal respiratory effort.  CARDIOVASCULAR: No peripheral edema.  SKIN: No rash, lesions or ulcers to visible skin.  MUSCULOSKELETAL No digital cyanosis. Normal gait and station.  NEURO: Cranial nerves II--XII grossly intact.  PSYCH: Intact judgment and insight. A&OX3 with a cordial affect.    Labs:  Lab Results   Component Value Date    WBC 6.4 07/27/2022    HGB 13.8 07/27/2022    HCT 40.7 07/27/2022    PLT 250 07/27/2022    CHOL 173 07/27/2022    TRIG 146 07/27/2022    HDL 61 07/27/2022    ALT 19 07/27/2022    AST 18 07/27/2022    NA 141 07/27/2022    K 4.1 07/27/2022    CL 104 07/27/2022    CREATININE 0.9 07/27/2022    BUN 14 07/27/2022    CO2 24 07/27/2022    TSH 0.033 (L) 07/27/2022    LABA1C 5.8 07/27/2022      Results        Assessment/Plan:  1. Acute non-recurrent frontal sinusitis      Assessment &  Plan  1. Acute bacterial sinusitis.  She presents with sinus drainage, a constant cough, and difficulty breathing when switching sides during sleep. Examination revealed no signs of pneumonia, but her symptoms suggest a risk of the infection spreading to her lungs. A prescription for Augmentin  was provided to treat the sinus infection and prevent potential pneumonia. She is advised to take Augmentin  twice daily for 10 days, but it can be discontinued after 7 days if symptoms improve. Additionally, a 5-day tapering course of prednisone  was prescribed to alleviate her nocturnal cough. She should continue with Mucinex, cough suppressant, and NyQuil at night.         I have reviewed at least three labs this appointment.     All issues not specifically addressed in the text above are chronic, stable, and mediations were refilled where appropriate. Will check related labwork where appropriate.    I confirm that I have managed the broad scope of the patient's health needs by furnishing care for some or all the patient's acute and/or chronic conditions across a spectrum of diagnoses and organ systems that will require ongoing care with myself or someone on my team.    The patient (or guardian, if applicable) and other individuals in attendance with the patient were advised that Artificial Intelligence will be utilized during this visit to record, process the conversation to generate a clinical note, and support improvement of the AI technology. The patient (or guardian, if applicable) and other individuals in attendance at the appointment consented to the use of AI, including the recording.                    Reggie Caper, MD  Family Medicine   [facility]  12/12/2022   No follow-ups on file.

## 2023-01-16 MED ORDER — MECLIZINE HCL 12.5 MG PO TABS
12.5 | ORAL_TABLET | Freq: Three times a day (TID) | ORAL | 0 refills | Status: AC | PRN
Start: 2023-01-16 — End: 2023-01-26

## 2023-01-16 MED ORDER — PREDNISONE 20 MG PO TABS
20 | ORAL_TABLET | ORAL | 0 refills | Status: DC
Start: 2023-01-16 — End: 2023-01-30

## 2023-01-16 NOTE — Progress Notes (Signed)
 Rx sent for vertigo

## 2023-01-23 ENCOUNTER — Encounter
Payer: BLUE CROSS/BLUE SHIELD | Attending: Student in an Organized Health Care Education/Training Program | Primary: Family Medicine

## 2023-01-30 ENCOUNTER — Ambulatory Visit
Admit: 2023-01-30 | Discharge: 2023-01-30 | Payer: BLUE CROSS/BLUE SHIELD | Attending: Family Medicine | Primary: Family Medicine

## 2023-01-30 VITALS — BP 110/60 | HR 65 | Temp 98.50000°F | Ht 66.0 in | Wt 198.6 lb

## 2023-01-30 DIAGNOSIS — Z0000 Encounter for general adult medical examination without abnormal findings: Secondary | ICD-10-CM

## 2023-01-30 MED ORDER — TOPIRAMATE 25 MG PO TABS
25 | ORAL_TABLET | Freq: Every evening | ORAL | 1 refills | Status: AC
Start: 2023-01-30 — End: ?

## 2023-01-30 MED ORDER — ATORVASTATIN CALCIUM 20 MG PO TABS
20 | ORAL_TABLET | Freq: Every day | ORAL | 3 refills | Status: AC
Start: 2023-01-30 — End: ?

## 2023-01-30 MED ORDER — LEVOTHYROXINE SODIUM 125 MCG PO TABS
125 | ORAL_TABLET | Freq: Every day | ORAL | 3 refills | Status: DC
Start: 2023-01-30 — End: 2024-02-04

## 2023-01-30 MED ORDER — PHENTERMINE HCL 15 MG PO CAPS
15 | ORAL_CAPSULE | Freq: Every morning | ORAL | 1 refills | Status: AC
Start: 2023-01-30 — End: 2023-07-29

## 2023-01-30 NOTE — Progress Notes (Signed)
CHIEF COMPLAINT:  Chief Complaint   Patient presents with    Hyperlipidemia        HISTORY OF PRESENT ILLNESS:  Rhonda Simon is a 53 y.o. female  who presents for CPE and to discuss issues  History of Present Illness  The patient presents for evaluation of weight management, hypothyroidism, and hyperlipidemia.    She has been inconsistent in her use of atorvastatin due to frequent travel. She is seeking a refill of her Lipitor prescription.    She has misplaced her levothyroxine medication and is requesting a refill of her Synthroid prescription.    She was prescribed phentermine and topiramate for weight loss, with the intention to start the regimen in 12/2022, but has not yet initiated it. Her last use of phentermine was in the summer of 2024, during which she mistakenly took one tablet daily instead of the prescribed two, resulting in suboptimal appetite suppression. She expresses a desire to resume the correct dosage of phentermine. She has recently acquired new tennis shoes and plans to incorporate walking into her routine. She reports that discontinuation of the medication did not result in weight gain. Her baseline weight is typically around 193 pounds, but she has experienced a recent increase to 198 pounds due to a weekend of celebrations and lack of medication adherence. She acknowledges the need for further weight loss but doubts her ability to reduce her weight below 180 pounds.    She is due for a mammogram in 03/2023, which she plans to schedule at [facility]. She has dense breast tissue and undergoes annual mammograms. She is up to date on her colonoscopy.    MEDICATIONS  Current: atorvastatin, levothyroxine, phentermine, topiramate      PHQ:      01/30/2023     9:42 AM   PHQ-9    Little interest or pleasure in doing things 0   Feeling down, depressed, or hopeless 0   PHQ-2 Score 0   PHQ-9 Total Score 0       CURRENT MEDICATION LIST:    Current Outpatient Medications   Medication Sig Dispense Refill     atorvastatin (LIPITOR) 20 MG tablet Take 1 tablet by mouth daily 90 tablet 3    levothyroxine (SYNTHROID) 125 MCG tablet Take 1 tablet by mouth daily 90 tablet 3    phentermine 15 MG capsule Take 2 capsules by mouth every morning for 180 days. Max Daily Amount: 30 mg 180 capsule 1    topiramate (TOPAMAX) 25 MG tablet Take 2 tablets by mouth nightly 180 tablet 1    clobetasol (TEMOVATE) 0.05 % ointment Apply topically 2 times daily for two weeks then twice a week 1 each 1    estradiol (ESTRACE VAGINAL) 0.1 MG/GM vaginal cream Insert 1g with applicator vaginally and a pea sized amount externally nightly for two weeks, then use twice weekly. 1 each 3     No current facility-administered medications for this visit.        ALLERGIES:    No Known Allergies     HISTORY:  Past Medical History:   Diagnosis Date    Hypothyroidism ? age 25    Lichen sclerosus       History reviewed. No pertinent surgical history.   Social History     Socioeconomic History    Marital status: Married     Spouse name: Not on file    Number of children: Not on file    Years of education: Not on file  Highest education level: Not on file   Occupational History    Not on file   Tobacco Use    Smoking status: Never    Smokeless tobacco: Never   Vaping Use    Vaping status: Never Used   Substance and Sexual Activity    Alcohol use: Yes     Alcohol/week: 10.0 standard drinks of alcohol     Types: 6 Glasses of wine, 4 Drinks containing 0.5 oz of alcohol per week    Drug use: Never    Sexual activity: Yes     Partners: Male   Other Topics Concern    Not on file   Social History Narrative    Not on file     Social Determinants of Health     Financial Resource Strain: Not on file   Food Insecurity: Not on file   Transportation Needs: Not on file   Physical Activity: Not on file   Stress: Not on file   Social Connections: Not on file   Intimate Partner Violence: Not on file   Housing Stability: Not on file      Family History   Problem Relation Age of  Onset    Heart Attack Maternal Grandmother     Heart Disease Maternal Grandfather         REVIEW OF SYSTEMS:  Per HPI. Otherwise balance of 10 point ROS is negative.     PHYSICAL EXAM:  Vital Signs -   Visit Vitals  BP 110/60   Pulse 65   Temp 98.5 F (36.9 C) (Oral)   Ht 1.676 m (5\' 6" )   Wt 90.1 kg (198 lb 9.6 oz)   SpO2 98%   BMI 32.05 kg/m        GENERAL APPEARANCE: well developed, well nourished, alert and oriented X 3, no acute distress, appropriate affect  HEAD: normocephalic, atraumatic.   EYES: pupils equal, round, reactive to light.   EARS: normal.   NOSE: sinuses nontender bilaterally.   ORAL CAVITY: mucosa moist.   THROAT: clear.   NECK: neck supple, full range of motion, no cervical lymphadenopathy.   LYMPH NODES: no cervical, supraclavicular adenopathy  CARDIOVASCULAR: no pedal edema or varicosities, good peripheral pulsations.   HEART: no murmurs, normal rate and regular rhythm, S1, S2 normal.   LUNGS: clear to auscultation bilaterally.   ABDOMEN: no hepatosplenomegaly, no masses palpable, soft, nontender, nondistended.   SKIN: no suspicious lesions, warm and dry.   MUSCULOSKELETAL: full range of motion.   BACK: normal, no scoliosis, spine nontender to palpation.   EXTREMITIES: no edema, clubbing or cyanosis.   PERIPHERAL PULSES: palpable in all 4 extremities.   NEUROLOGIC: nonfocal, motor strength normal upper and lower extremities, sensory exam intact.   PSYCH: alert, oriented, cognitive function intact, judgment and insight good, mood/affect appropriate.         LABS  No results found for this visit on 01/30/23.  Office Visit on 11/02/2022   Component Date Value Ref Range Status    Diagnosis: 11/02/2022 Comment   Final    NEGATIVE FOR INTRAEPITHELIAL LESION OR MALIGNANCY.    Specimen adequacy: 11/02/2022 Comment   Final    Comment: Satisfactory for evaluation. Endocervical and/or squamous metaplastic  cells (endocervical component) are present.      Clinician Provided ICD 11/02/2022 Comment    Final    Z12.4    Performed by: 11/02/2022 Comment   Final    Tammy Antony Madura, Cytotechnologist (ASCP)    . 11/02/2022 .  Final    Note: 11/02/2022 Comment   Final    Comment: The Pap smear is a screening test designed to aid in the detection of  premalignant and malignant conditions of the uterine cervix.  It is not a  diagnostic procedure and should not be used as the sole means of detecting  cervical cancer.  Both false-positive and false-negative reports do occur.      Methodology: 11/02/2022 Comment   Final    Comment: This liquid based ThinPrep(R) pap test was screened with the  use of an image guided system.      HPV Aptima 11/02/2022 Negative  Negative Final    Comment: This nucleic acid amplification test detects fourteen high-risk  HPV types (16,18,31,33,35,39,45,51,52,56,58,59,66,68) without  differentiation.      HPV Genotype Reflex 11/02/2022 Comment   Final    Criteria not met, HPV Genotype not performed.     Results        IMPRESSION/PLAN    1. Annual visit for general adult medical examination without abnormal findings  2. Acquired hypothyroidism  3. Prediabetes  4. Familial hypercholesteremia  Overview:  CAS 0 in 2024   Orders:  -     atorvastatin (LIPITOR) 20 MG tablet; Take 1 tablet by mouth daily, Disp-90 tablet, R-3Normal  5. Class 1 obesity due to excess calories without serious comorbidity with body mass index (BMI) of 32.0 to 32.9 in adult  -     phentermine 15 MG capsule; Take 2 capsules by mouth every morning for 180 days. Max Daily Amount: 30 mg, Disp-180 capsule, R-1Normal     Assessment & Plan  1. Hyperlipidemia.  A prescription for Lipitor (atorvastatin) has been issued, providing a year's supply.    2. Hypothyroidism.  A prescription for Synthroid (levothyroxine) has been issued, providing a year's supply.    3. Weight management.  A prescription for phentermine 15 mg has been issued, with instructions to take one tablet daily initially, and then increase to two tablets daily after 2  to 4 weeks. A prescription for topiramate has also been issued, with instructions to take one tablet nightly initially, and then increase to two tablets nightly after 2 to 4 weeks. She is advised to start the medications when ready.    4. Health maintenance.  She is advised to schedule her mammogram at [facility], as it has been nearly two years since her last one in February 2023. She is up to date on her colonoscopy.      Follow up and Dispositions:  No follow-ups on file.       Rhonda Curet, Rhonda Simon    The patient (or guardian, if applicable) and other individuals in attendance with the patient were advised that Artificial Intelligence will be utilized during this visit to record, process the conversation to generate a clinical note, and support improvement of the AI technology. The patient (or guardian, if applicable) and other individuals in attendance at the appointment consented to the use of AI, including the recording.

## 2023-02-16 ENCOUNTER — Encounter: Admit: 2023-02-16 | Discharge: 2023-02-16 | Payer: MEDICAID

## 2023-03-20 ENCOUNTER — Encounter
Payer: BLUE CROSS/BLUE SHIELD | Attending: Student in an Organized Health Care Education/Training Program | Primary: Family Medicine

## 2023-03-29 ENCOUNTER — Ambulatory Visit
Admit: 2023-03-29 | Discharge: 2023-03-29 | Payer: BLUE CROSS/BLUE SHIELD | Attending: Student in an Organized Health Care Education/Training Program | Primary: Family Medicine

## 2023-03-29 VITALS — BP 110/76 | HR 81 | Wt 195.0 lb

## 2023-03-29 DIAGNOSIS — Z09 Encounter for follow-up examination after completed treatment for conditions other than malignant neoplasm: Secondary | ICD-10-CM

## 2023-03-29 NOTE — Progress Notes (Signed)
 DATE OF VISIT:  04/06/23    PATIENT NAME:  Rhonda Simon     DATE OF BIRTH: [birth year]    REASON FOR VISIT:    Chief Complaint   Patient presents with    Follow-up     3 months        HISTORY OF PRESENT ILLNESS:  Rhonda Simon is a 53 y.o. [id number] presenting for follow up for lichen sclerosus. She has been using temovate irregularly and notes some itching.      No LMP recorded. Patient is postmenopausal.  No Known Allergies  Current Outpatient Medications   Medication Sig Dispense Refill    atorvastatin (LIPITOR) 20 MG tablet Take 1 tablet by mouth daily 90 tablet 3    levothyroxine (SYNTHROID) 125 MCG tablet Take 1 tablet by mouth daily 90 tablet 3    phentermine 15 MG capsule Take 2 capsules by mouth every morning for 180 days. Max Daily Amount: 30 mg 180 capsule 1    topiramate (TOPAMAX) 25 MG tablet Take 2 tablets by mouth nightly 180 tablet 1    clobetasol (TEMOVATE) 0.05 % ointment Apply topically 2 times daily for two weeks then twice a week 1 each 1    estradiol (ESTRACE VAGINAL) 0.1 MG/GM vaginal cream Insert 1g with applicator vaginally and a pea sized amount externally nightly for two weeks, then use twice weekly. 1 each 3     No current facility-administered medications for this visit.     Past Medical History:   Diagnosis Date    Hypothyroidism ? age 59    Lichen sclerosus      History reviewed. No pertinent surgical history.  Social History     Socioeconomic History    Marital status: Married     Spouse name: None    Number of children: None    Years of education: None    Highest education level: None   Tobacco Use    Smoking status: Never    Smokeless tobacco: Never   Vaping Use    Vaping status: Never Used   Substance and Sexual Activity    Alcohol use: Yes     Alcohol/week: 10.0 standard drinks of alcohol     Types: 6 Glasses of wine, 4 Drinks containing 0.5 oz of alcohol per week    Drug use: Never    Sexual activity: Yes     Partners: Male     Family History   Problem Relation Age of Onset    Heart Attack  Maternal Grandmother     Heart Disease Maternal Grandfather         REVIEW OF SYSTEMS:  Negative other than HPI      PHYSICAL EXAM:  BP 110/76   Pulse 81   Wt 88.5 kg (195 lb)   SpO2 98%   BMI 31.47 kg/m   Body mass index is 31.47 kg/m.  General:  well developed, well nourished, in no acute distress     Head:   normocephalic and atraumatic     Lungs:  normal work of breathing on room air     Heart:   warm and well perfused     ABDOMEN: soft, nontender, nondistended, no masses palpable.     Gynecological:  EXTERNAL GENITALIA:  introitus atrophic, agglutination of labia minora, no lesions.   URETHRA: normal.     Extremities: no clubbing, cyanosis, edema, or deformity noted with normal full range of motion of all joints     Neurologic:  no  focal deficits,normal coordination, muscle strength and tone     Skin:   intact without lesions or rashes     Psych:  alert and cooperative; normal mood and affect; normal attention span and concentration      ASSESSMENT/PLAN:    Rhonda Simon was seen today for follow-up.    Diagnoses and all orders for this visit:    Follow-up examination    Lichen sclerosus    Discussed recurrence and symptoms may be secondary to irregular use of Temovate.  Advised her to use medication consistently.  If symptomatic after doing so, will perform biopsy.     Return in about 3 months (around 06/29/2023) for Follow Up.

## 2023-06-21 ENCOUNTER — Encounter: Attending: Student in an Organized Health Care Education/Training Program | Primary: Family Medicine

## 2023-07-18 ENCOUNTER — Telehealth: Payer: Self-pay | Admitting: Primary Care

## 2023-07-18 NOTE — Telephone Encounter (Signed)
 Error

## 2023-08-13 ENCOUNTER — Encounter

## 2023-08-13 MED ORDER — PHENTERMINE HCL 15 MG PO CAPS
15 | ORAL_CAPSULE | Freq: Every morning | ORAL | 1 refills | 30.00000 days | Status: AC
Start: 2023-08-13 — End: 2024-02-09

## 2023-09-24 ENCOUNTER — Encounter

## 2023-09-24 NOTE — Telephone Encounter (Signed)
 ma

## 2023-10-11 ENCOUNTER — Inpatient Hospital Stay: Admit: 2023-10-11 | Payer: BLUE CROSS/BLUE SHIELD | Primary: Family Medicine

## 2023-10-11 DIAGNOSIS — Z1231 Encounter for screening mammogram for malignant neoplasm of breast: Principal | ICD-10-CM

## 2024-01-09 ENCOUNTER — Encounter: Admit: 2024-01-09 | Discharge: 2024-01-09 | Payer: MEDICAID

## 2024-02-04 MED ORDER — LEVOTHYROXINE SODIUM 125 MCG PO TABS
125 | ORAL_TABLET | Freq: Every day | ORAL | 3 refills | Status: AC
Start: 2024-02-04 — End: ?
